# Patient Record
Sex: Male | Born: 1945
Health system: Southern US, Community
[De-identification: ages and names within clinical notes are randomized; demographics above are authoritative.]

## PROBLEM LIST (undated history)

## (undated) DIAGNOSIS — R600 Localized edema: Secondary | ICD-10-CM

## (undated) DIAGNOSIS — I071 Rheumatic tricuspid insufficiency: Secondary | ICD-10-CM

## (undated) DIAGNOSIS — I4891 Unspecified atrial fibrillation: Secondary | ICD-10-CM

## (undated) DIAGNOSIS — I509 Heart failure, unspecified: Secondary | ICD-10-CM

## (undated) DIAGNOSIS — N400 Enlarged prostate without lower urinary tract symptoms: Secondary | ICD-10-CM

## (undated) DIAGNOSIS — I10 Essential (primary) hypertension: Secondary | ICD-10-CM

## (undated) DIAGNOSIS — D649 Anemia, unspecified: Secondary | ICD-10-CM

## (undated) DIAGNOSIS — I71019 Dissection of thoracic aorta, unspecified: Secondary | ICD-10-CM

## (undated) DIAGNOSIS — Q2543 Congenital aneurysm of aorta: Secondary | ICD-10-CM

## (undated) DIAGNOSIS — D696 Thrombocytopenia, unspecified: Secondary | ICD-10-CM

## (undated) DIAGNOSIS — F329 Major depressive disorder, single episode, unspecified: Secondary | ICD-10-CM

## (undated) DIAGNOSIS — E785 Hyperlipidemia, unspecified: Secondary | ICD-10-CM

## (undated) DIAGNOSIS — I7100 Dissection of unspecified site of aorta: Principal | ICD-10-CM

## (undated) DIAGNOSIS — Z9289 Personal history of other medical treatment: Secondary | ICD-10-CM

## (undated) DIAGNOSIS — F32A Depression, unspecified: Secondary | ICD-10-CM

## (undated) DIAGNOSIS — I712 Thoracic aortic aneurysm, without rupture: Secondary | ICD-10-CM

## (undated) DIAGNOSIS — G709 Myoneural disorder, unspecified: Secondary | ICD-10-CM

## (undated) DIAGNOSIS — I340 Nonrheumatic mitral (valve) insufficiency: Secondary | ICD-10-CM

## (undated) DIAGNOSIS — I719 Aortic aneurysm of unspecified site, without rupture: Secondary | ICD-10-CM

## (undated) DIAGNOSIS — G35 Multiple sclerosis: Secondary | ICD-10-CM

## (undated) DIAGNOSIS — I7121 Aneurysm of the ascending aorta, without rupture: Secondary | ICD-10-CM

## (undated) DIAGNOSIS — I7101 Dissection of thoracic aorta: Secondary | ICD-10-CM

## (undated) DIAGNOSIS — J90 Pleural effusion, not elsewhere classified: Secondary | ICD-10-CM

## (undated) DIAGNOSIS — R0602 Shortness of breath: Secondary | ICD-10-CM

## (undated) HISTORY — DX: Thrombocytopenia, unspecified: D69.6

## (undated) HISTORY — DX: Anemia, unspecified: D64.9

## (undated) HISTORY — DX: Pleural effusion, not elsewhere classified: J90

## (undated) HISTORY — DX: Dissection of unspecified site of aorta: I71.00

## (undated) HISTORY — DX: Hyperlipidemia, unspecified: E78.5

## (undated) HISTORY — DX: Nonrheumatic mitral (valve) insufficiency: I34.0

## (undated) HISTORY — DX: Congenital aneurysm of aorta: Q25.43

## (undated) HISTORY — DX: Heart failure, unspecified: I50.9

## (undated) HISTORY — DX: Depression, unspecified: F32.A

## (undated) HISTORY — DX: Rheumatic tricuspid insufficiency: I07.1

## (undated) HISTORY — DX: Multiple sclerosis: G35

## (undated) HISTORY — DX: Dissection of thoracic aorta, unspecified: I71.019

## (undated) HISTORY — DX: Myoneural disorder, unspecified: G70.9

## (undated) HISTORY — DX: Thoracic aortic aneurysm, without rupture: I71.2

## (undated) HISTORY — DX: Aneurysm of the ascending aorta, without rupture: I71.21

## (undated) HISTORY — DX: Unspecified atrial fibrillation: I48.91

## (undated) HISTORY — DX: Benign prostatic hyperplasia without lower urinary tract symptoms: N40.0

## (undated) HISTORY — DX: Essential (primary) hypertension: I10

## (undated) HISTORY — DX: Aortic aneurysm of unspecified site, without rupture: I71.9

## (undated) HISTORY — DX: Major depressive disorder, single episode, unspecified: F32.9

## (undated) HISTORY — DX: Localized edema: R60.0

## (undated) HISTORY — DX: Dissection of thoracic aorta: I71.01

---
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
## 1998-10-15 ENCOUNTER — Encounter: Payer: Self-pay | Admitting: Internal Medicine

## 1998-10-15 ENCOUNTER — Inpatient Hospital Stay (HOSPITAL_COMMUNITY): Admission: EM | Admit: 1998-10-15 | Discharge: 1998-10-23 | Payer: Self-pay | Admitting: *Deleted

## 1998-10-15 HISTORY — PX: OTHER SURGICAL HISTORY: SHX169

## 1998-10-16 ENCOUNTER — Encounter: Payer: Self-pay | Admitting: Thoracic Surgery (Cardiothoracic Vascular Surgery)

## 1998-10-17 ENCOUNTER — Encounter: Payer: Self-pay | Admitting: Thoracic Surgery (Cardiothoracic Vascular Surgery)

## 1998-10-18 ENCOUNTER — Encounter: Payer: Self-pay | Admitting: Thoracic Surgery (Cardiothoracic Vascular Surgery)

## 1998-10-19 ENCOUNTER — Encounter: Payer: Self-pay | Admitting: Thoracic Surgery (Cardiothoracic Vascular Surgery)

## 1998-11-01 ENCOUNTER — Emergency Department (HOSPITAL_COMMUNITY): Admission: EM | Admit: 1998-11-01 | Discharge: 1998-11-01 | Payer: Self-pay | Admitting: Emergency Medicine

## 1998-11-01 ENCOUNTER — Encounter: Payer: Self-pay | Admitting: Internal Medicine

## 1998-11-05 DIAGNOSIS — I7100 Dissection of unspecified site of aorta: Secondary | ICD-10-CM

## 1998-11-05 HISTORY — DX: Dissection of unspecified site of aorta: I71.00

## 1998-11-26 ENCOUNTER — Encounter (HOSPITAL_COMMUNITY): Admission: RE | Admit: 1998-11-26 | Discharge: 1999-02-24 | Payer: Self-pay | Admitting: Cardiology

## 1999-02-25 ENCOUNTER — Encounter (HOSPITAL_COMMUNITY): Admission: RE | Admit: 1999-02-25 | Discharge: 1999-05-26 | Payer: Self-pay | Admitting: Cardiology

## 1999-07-26 ENCOUNTER — Inpatient Hospital Stay (HOSPITAL_COMMUNITY): Admission: EM | Admit: 1999-07-26 | Discharge: 1999-07-28 | Payer: Self-pay | Admitting: Emergency Medicine

## 1999-11-03 ENCOUNTER — Encounter
Admission: RE | Admit: 1999-11-03 | Discharge: 1999-11-03 | Payer: Self-pay | Admitting: Thoracic Surgery (Cardiothoracic Vascular Surgery)

## 1999-11-03 ENCOUNTER — Encounter: Payer: Self-pay | Admitting: Thoracic Surgery (Cardiothoracic Vascular Surgery)

## 2000-05-21 ENCOUNTER — Encounter
Admission: RE | Admit: 2000-05-21 | Discharge: 2000-05-21 | Payer: Self-pay | Admitting: Thoracic Surgery (Cardiothoracic Vascular Surgery)

## 2000-05-21 ENCOUNTER — Encounter: Payer: Self-pay | Admitting: Thoracic Surgery (Cardiothoracic Vascular Surgery)

## 2000-11-29 ENCOUNTER — Encounter: Payer: Self-pay | Admitting: Thoracic Surgery (Cardiothoracic Vascular Surgery)

## 2000-11-29 ENCOUNTER — Encounter
Admission: RE | Admit: 2000-11-29 | Discharge: 2000-11-29 | Payer: Self-pay | Admitting: Thoracic Surgery (Cardiothoracic Vascular Surgery)

## 2000-12-17 ENCOUNTER — Ambulatory Visit (HOSPITAL_COMMUNITY): Admission: RE | Admit: 2000-12-17 | Discharge: 2000-12-17 | Payer: Self-pay | Admitting: *Deleted

## 2000-12-17 ENCOUNTER — Encounter (INDEPENDENT_AMBULATORY_CARE_PROVIDER_SITE_OTHER): Payer: Self-pay | Admitting: *Deleted

## 2001-11-21 ENCOUNTER — Encounter
Admission: RE | Admit: 2001-11-21 | Discharge: 2001-11-21 | Payer: Self-pay | Admitting: Thoracic Surgery (Cardiothoracic Vascular Surgery)

## 2001-11-21 ENCOUNTER — Encounter: Payer: Self-pay | Admitting: Thoracic Surgery (Cardiothoracic Vascular Surgery)

## 2002-12-08 ENCOUNTER — Encounter
Admission: RE | Admit: 2002-12-08 | Discharge: 2002-12-08 | Payer: Self-pay | Admitting: Thoracic Surgery (Cardiothoracic Vascular Surgery)

## 2002-12-08 ENCOUNTER — Encounter: Payer: Self-pay | Admitting: Thoracic Surgery (Cardiothoracic Vascular Surgery)

## 2003-11-22 ENCOUNTER — Ambulatory Visit: Admission: RE | Admit: 2003-11-22 | Discharge: 2003-11-22 | Payer: Self-pay | Admitting: Internal Medicine

## 2003-11-22 ENCOUNTER — Encounter (INDEPENDENT_AMBULATORY_CARE_PROVIDER_SITE_OTHER): Payer: Self-pay | Admitting: Cardiology

## 2003-12-17 ENCOUNTER — Encounter
Admission: RE | Admit: 2003-12-17 | Discharge: 2003-12-17 | Payer: Self-pay | Admitting: Thoracic Surgery (Cardiothoracic Vascular Surgery)

## 2004-02-21 ENCOUNTER — Ambulatory Visit (HOSPITAL_COMMUNITY): Admission: RE | Admit: 2004-02-21 | Discharge: 2004-02-21 | Payer: Self-pay | Admitting: *Deleted

## 2004-02-22 ENCOUNTER — Encounter (INDEPENDENT_AMBULATORY_CARE_PROVIDER_SITE_OTHER): Payer: Self-pay | Admitting: Specialist

## 2004-06-03 ENCOUNTER — Encounter: Admission: RE | Admit: 2004-06-03 | Discharge: 2004-07-31 | Payer: Self-pay | Admitting: Neurology

## 2004-12-15 ENCOUNTER — Encounter
Admission: RE | Admit: 2004-12-15 | Discharge: 2004-12-15 | Payer: Self-pay | Admitting: Thoracic Surgery (Cardiothoracic Vascular Surgery)

## 2005-07-15 HISTORY — PX: OTHER SURGICAL HISTORY: SHX169

## 2005-07-19 ENCOUNTER — Emergency Department (HOSPITAL_COMMUNITY): Admission: EM | Admit: 2005-07-19 | Discharge: 2005-07-19 | Payer: Self-pay | Admitting: Emergency Medicine

## 2005-07-24 ENCOUNTER — Ambulatory Visit (HOSPITAL_COMMUNITY): Admission: RE | Admit: 2005-07-24 | Discharge: 2005-07-25 | Payer: Self-pay | Admitting: Orthopaedic Surgery

## 2006-01-04 ENCOUNTER — Encounter
Admission: RE | Admit: 2006-01-04 | Discharge: 2006-01-04 | Payer: Self-pay | Admitting: Thoracic Surgery (Cardiothoracic Vascular Surgery)

## 2006-05-07 ENCOUNTER — Encounter: Admission: RE | Admit: 2006-05-07 | Discharge: 2006-05-07 | Payer: Self-pay | Admitting: Internal Medicine

## 2007-02-28 ENCOUNTER — Encounter
Admission: RE | Admit: 2007-02-28 | Discharge: 2007-02-28 | Payer: Self-pay | Admitting: Thoracic Surgery (Cardiothoracic Vascular Surgery)

## 2007-02-28 ENCOUNTER — Ambulatory Visit: Payer: Self-pay | Admitting: Thoracic Surgery (Cardiothoracic Vascular Surgery)

## 2007-09-15 HISTORY — PX: HEMORRHOID SURGERY: SHX153

## 2007-09-27 ENCOUNTER — Emergency Department (HOSPITAL_COMMUNITY): Admission: EM | Admit: 2007-09-27 | Discharge: 2007-09-27 | Payer: Self-pay | Admitting: Emergency Medicine

## 2008-01-09 ENCOUNTER — Inpatient Hospital Stay (HOSPITAL_COMMUNITY): Admission: EM | Admit: 2008-01-09 | Discharge: 2008-01-14 | Payer: Self-pay | Admitting: Emergency Medicine

## 2008-01-09 ENCOUNTER — Ambulatory Visit: Payer: Self-pay | Admitting: Oncology

## 2008-01-15 ENCOUNTER — Inpatient Hospital Stay (HOSPITAL_COMMUNITY): Admission: EM | Admit: 2008-01-15 | Discharge: 2008-01-16 | Payer: Self-pay | Admitting: Emergency Medicine

## 2008-02-14 ENCOUNTER — Encounter (INDEPENDENT_AMBULATORY_CARE_PROVIDER_SITE_OTHER): Payer: Self-pay | Admitting: General Surgery

## 2008-02-16 ENCOUNTER — Inpatient Hospital Stay (HOSPITAL_COMMUNITY): Admission: AD | Admit: 2008-02-16 | Discharge: 2008-02-17 | Payer: Self-pay | Admitting: General Surgery

## 2008-03-12 ENCOUNTER — Encounter: Admission: RE | Admit: 2008-03-12 | Discharge: 2008-05-15 | Payer: Self-pay | Admitting: Neurology

## 2008-06-18 ENCOUNTER — Encounter
Admission: RE | Admit: 2008-06-18 | Discharge: 2008-06-18 | Payer: Self-pay | Admitting: Thoracic Surgery (Cardiothoracic Vascular Surgery)

## 2008-06-18 ENCOUNTER — Ambulatory Visit: Payer: Self-pay | Admitting: Thoracic Surgery (Cardiothoracic Vascular Surgery)

## 2009-07-01 ENCOUNTER — Ambulatory Visit: Payer: Self-pay | Admitting: Thoracic Surgery (Cardiothoracic Vascular Surgery)

## 2009-07-01 ENCOUNTER — Encounter
Admission: RE | Admit: 2009-07-01 | Discharge: 2009-07-01 | Payer: Self-pay | Admitting: Thoracic Surgery (Cardiothoracic Vascular Surgery)

## 2009-11-15 ENCOUNTER — Encounter: Admission: RE | Admit: 2009-11-15 | Discharge: 2009-11-15 | Payer: Self-pay | Admitting: Cardiology

## 2010-03-07 ENCOUNTER — Ambulatory Visit: Payer: Self-pay | Admitting: Internal Medicine

## 2010-03-12 LAB — CBC WITH DIFFERENTIAL/PLATELET
BASO%: 1.2 % (ref 0.0–2.0)
Basophils Absolute: 0.1 10*3/uL (ref 0.0–0.1)
EOS%: 0.8 % (ref 0.0–7.0)
Eosinophils Absolute: 0 10*3/uL (ref 0.0–0.5)
HCT: 39.2 % (ref 38.4–49.9)
HGB: 13.1 g/dL (ref 13.0–17.1)
LYMPH%: 18.1 % (ref 14.0–49.0)
MCH: 28.6 pg (ref 27.2–33.4)
MCHC: 33.3 g/dL (ref 32.0–36.0)
MCV: 85.9 fL (ref 79.3–98.0)
MONO#: 0.5 10*3/uL (ref 0.1–0.9)
MONO%: 9.8 % (ref 0.0–14.0)
NEUT#: 3.3 10*3/uL (ref 1.5–6.5)
NEUT%: 70.1 % (ref 39.0–75.0)
Platelets: 80 10*3/uL — ABNORMAL LOW (ref 140–400)
RBC: 4.57 10*6/uL (ref 4.20–5.82)
RDW: 14.7 % — ABNORMAL HIGH (ref 11.0–14.6)
WBC: 4.7 10*3/uL (ref 4.0–10.3)
lymph#: 0.8 10*3/uL — ABNORMAL LOW (ref 0.9–3.3)

## 2010-03-12 LAB — COMPREHENSIVE METABOLIC PANEL
ALT: 19 U/L (ref 0–53)
AST: 23 U/L (ref 0–37)
Albumin: 4.5 g/dL (ref 3.5–5.2)
Alkaline Phosphatase: 162 U/L — ABNORMAL HIGH (ref 39–117)
BUN: 25 mg/dL — ABNORMAL HIGH (ref 6–23)
CO2: 26 mEq/L (ref 19–32)
Calcium: 10.1 mg/dL (ref 8.4–10.5)
Chloride: 104 mEq/L (ref 96–112)
Creatinine, Ser: 1.24 mg/dL (ref 0.40–1.50)
Glucose, Bld: 89 mg/dL (ref 70–99)
Potassium: 4.3 mEq/L (ref 3.5–5.3)
Sodium: 143 mEq/L (ref 135–145)
Total Bilirubin: 1 mg/dL (ref 0.3–1.2)
Total Protein: 7.4 g/dL (ref 6.0–8.3)

## 2010-03-12 LAB — LACTATE DEHYDROGENASE: LDH: 128 U/L (ref 94–250)

## 2010-10-05 ENCOUNTER — Encounter: Payer: Self-pay | Admitting: Thoracic Surgery (Cardiothoracic Vascular Surgery)

## 2011-01-27 NOTE — H&P (Signed)
NAMECATRELL, THORNBERRY             ACCOUNT NO.:  [id number]   MEDICAL RECORD NO.:  [id number]          PATIENT TYPE:  INP   LOCATION:  57                         FACILITY:  [facility]   PHYSICIAN:  Orson Ape. Weatherly, M.D.DATE OF BIRTH:  August 14, 1946   DATE OF ADMISSION:  01/09/2008  DATE OF DISCHARGE:                              HISTORY & PHYSICAL   CHIEF COMPLAINT:  Bleeding hemorrhoids.   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 The patient states that with  prolonged sitting this has happened on two occasions. While flying, he  gets back problems with hemorrhoids, and then approximately three years  ago, he did see Dr. Donnie Mesa in our office for problems with  hemorrhoids. Dr. Georgette Dover noted that he had external and internal  hemorrhoids and kind of recommended at that time a [place], but the patient  had an injury to his wrist, I think, and elected not to proceed with any  type of hemorrhoid surgery. He followed with [name], and he was  diagnosed as having multiple sclerosis, and Dr. Katherina Right is his  neurologist, and he was admitted to the hospital January 09, 2008 with  rectal bleeding that occurred kind of spontaneously. The patient states  this was kind of painless bleeding but is thought to have come from his  hemorrhoids, and on physical examination, he does have severe prolapsed  internal and external hemorrhoids. I was impressed on doing a rectal  exam that he really does not have a lot of sphincter tone, and I think  that he is having a weakness of his perianal sphincters that is actually  contributing to this prolapse. I called and talked to Dr. Katherina Right who  said that he had not done a rectal examination recently, that Mr.  Fairhurst's multiple sclerosis is predominantly lower extremity weakness.  He does still ambulate with a  cane. He is still active teaching at [employer],  and he is on his feet for about an hour during his lecturing.   The patient's past history is significant in that he has had a  colonoscopy. Dr. Vladimir Faster actually did his colonoscopy and referred him  to Dr. Georgette Dover in 2006, and at that time, there was no evidence of any  diverticulosis. I have reviewed some old CTs that have been performed,  and he always has a large amount of stool within his colon on the old  CTs that were done. I think at the time of his CTs, he has having  problems with dissecting aortic aneurysm. He also has always had kind of  a low platelet count. He is not thought to be a sclerotic  and  platelet  counts have been in about the 110 range. His liver tests, I think, have  always been normal. He has got a history of hypertension for which he is  on medication for control, Cardura, Lopressor, Norvasc, and also  diuretics.   On examination, at this time, he has  kind of a very poor muscle tone of  his abdomen. Not really any obvious ascites but kind of abdomen blocks  easily. Then, on rectal exam, he has got extensive, thrombosed, and  prolapsed external and internal hemorrhoids, big large swelling that  looked painful even though he is not having a lot of sphincter spasm and  not complaining of as much pain as you would expect normally for this  degree of thrombosed hemorrhoids. I cannot find any evidence of any  obvious infection other than the hemorrhoid area on kind of a rectal  examination, and the patient is febrile when he was admitted up to about  103. I am not sure what the etiology of his fever is. His white count  when he was admitted to the ER yesterday was 14,300 with a left shift on  the differential. He does have known gallstones, but he is not tender in  the abdomen. His PT and PTT were unremarkable. Liver tests are good, and  his BUN is 21 with glucose of 117.   I think that really with his degree of  hemorrhoids ultimately he is  going to come to a formal internal and external hemorrhoidectomy. The  patient is very reluctant to want to do anything from a surgery  standpoint. I think he has a significant fear that his multiple  sclerosis may progress with any type of general anesthesia and is asking  if I can do a formal hemorrhoidectomy with a local anesthesia, and with  the degree of hemorrhoidal problems that he has, that would not be  technically possibly in my opinion. The patient is thinking about what  to do. I asked [name] if he could possibly stop by and see the  patient. He said he was not going to the hospital, [name], today, and  that if we needed a neurologist to see him, one of his partners could  see him. I think [name] is the only neurologist that the patient has  confidence in, and if surgery would become needed, I think that brief  discussion with [name] would sooth a lot of anxiety. The other thing  that if we were actually planning on doing surgery, I would like to do  him prone so that we will have better exposure than trying to put his  feet up in a lithotomy position as I normally do my hemorrhoidectomies.  The neurologist have seen him and feel that he does not need another  colonoscopy until the year 2010, and I think that the bleeding that he  has had their concern that this would definitely hemorrhoidal bleeding,  even though I do not see an actual area that is actually bleeding right  at this time when I examined the patient. His hematocrit originally was  36; it is now 30. So he probably did have a significant amount of  bleeding, which was painless and possibly could be diverticulosis, but I  think that for now the hemorrhoids are the most pressing problem. I will  follow and let the patient decide whether the conservative measures that  they presently doing with the ointments and bedrest and etc., are not  relieving him. 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   IMPRESSION:  Severe internal and external thrombosed bleeding  hemorrhoids in a patient with known multiple sclerosis that his  progressing.           ______________________________  Orson Ape. Rise Patience, M.D.     WJW/MEDQ  D:  01/10/2008  T:  01/10/2008  Job:  [id number]

## 2011-01-27 NOTE — Discharge Summary (Signed)
[name]             ACCOUNT NO.:  [id number]   MEDICAL RECORD NO.:  [id number]          PATIENT TYPE:  INP   LOCATION:  1222                         FACILITY:  [facility]   PHYSICIAN:  Leana Gamer, MDDATE OF BIRTH:  04/24/1946   DATE OF ADMISSION:  01/09/2008  DATE OF DISCHARGE:  01/13/2008                               DISCHARGE SUMMARY   DISCHARGE DISPOSITION:  Home.   FINAL DISCHARGE DIAGNOSES:  1. Acute febrile illness, likely viral.  2. Diarrhea.  3. Thrombocytopenia, resolving.  4. Rectal bleeding.  5. Internal hemorrhoids.  6. History of multiple sclerosis for approximately 12 years.  The      patient is seen by [name].  7. History of aortic dissection in 2001 surgically repaired.  8. History of hypertension.  9. History of hyperlipidemia.  10.History of benign prostatic hypertrophy.   DISCHARGE MEDICATIONS:  1. Lasix 40 mg p.o. daily.  2. Crestor 5 mg p.o. daily.  3. Cardura 4 mg p.o. daily  4. Atacand/hydrochlorothiazide 32/12.5 mg p.o. daily.  5. Folic acid 1 mg p.o. daily.  6. Lopressor 100 mg p.o. daily.  7. Norvasc 5 mg p.o. daily.  8. Ciprofloxacin 500 mg p.o. b.i.d. x 5 days.   CONSULTANTS:  1. Dr. Watt Climes and Dr. Penelope Coop, gastroenterology.  2. Dr. Rise Patience, general surgery.  3. Dr. Benay Spice, hematology.   PROCEDURE:  None.   DIAGNOSTIC STUDIES:  1. Chest x-ray which shows no acute findings.  2. Renal ultrasound which shows no hydronephrosis or acute      abnormalities of the kidneys.  It shows bilateral renal cysts.      Impression:  A few gallstones within the gallbladder and with wall      thickening.  The wall thickening may be due to volume overload with      small volume ascites seen in the study.  Left pleural effusion.   CHIEF COMPLAINT:  Bright red blood per rectum.   HISTORY OF PRESENT ILLNESS:  Please see the H&P dictated by Dr. Carlton Adam on  January 09, 2008 for details of the HPI.   HOSPITAL COURSE:  1. Febrile illness.   The patient came with a febrile illness of      unknown etiology.  All cultures were negative including stool      cultures.  The patient was having some diarrhea at the time and was      started empirically on Flagyl and ciprofloxacin proceeded.      However, the patient never had any major escalation of the white      blood cell count and the fever abated.  It was felt by all involved      that this was not C. diff and the patient was discontinued from the      Flagyl.  2. Urinary tract infection versus colonization.  The patient was found      to have 37,000 colonies of E. coli in the urine.  Typically this      would not represent an infection.  However given the patient's      febrile state and his falling  platelet count, the patient was      continued on Cipro for treatment and he will be continued on Cipro      to complete a total of 10 days of treatment.  3. Thrombocytopenia.  The patient on admission had a platelet count of      120.  During his course of hospitalization, the platelet counts      dropped to a nadir of 48.  Hematology was consulted, particularly      in light of the fact that the patient was continuing to have some      bleeding from the rectum.  Their opinion was that it was a      combination of the febrile illness and possibly the Betaseron that      the patient takes for his emesis.  However as the febrile illness      resolved, the platelets started to come up.  On the day of      discharge, platelet count is 68,000.  Dr. Benay Spice has had a      discussion with the patient and has recommended the patient should      have his platelet counts checked on Monday and then his primary      physician, [name] make a decision about whether or not he needs      to follow up with hematology.  4. Rectal bleeding.  The patient was seen both by Dr. Watt Climes from      gastroenterology and by Dr. Rise Patience from general surgery.  Dr.      Rise Patience felt that the patient  would require hemorrhoidectomy to      definitively treat the bleeding in the hemorrhoids.  However, the      patient has declined at this time and would like to wait as an      outpatient and giving it further consideration.  The patient had      several episodes of intermittent bleeding.  However, his hemoglobin      remained stable at 10.  On the day of discharge the patient has a      hemoglobin of 10.0.  I understand that this is a decline in his      normal hemoglobin of approximately 13.  However, he had no big      decreases in his hemoglobin while hospitalized.  The patient did      have a discussion with Dr. Watt Climes about having a colonoscopy to      further investigate the cause of the bleeding.  However, the      patient again declined and states he would like to do this is an      outpatient.  Otherwise, the patient remained stable in the      hospital.  I have palced a call to Dr. Levin Erp to ask him to confer with the  patient by phone regarding the wisdom of obtaining the colonoscopy prior  to discharge from the hospital.   DISCHARGE MEDICATIONS:  He is continued on his usual medications and he  is being discharged on the usual medications in addition to which he is  continued on ciprofloxacin.   DIET:  He has tolerated a diet without any difficulty and his diet  should continue to be a heart-healthy low-cholesterol diet.   FOLLOW UP:  1. He should follow up with Dr. Levin Erp, his primary care      physician on Monday for a check  of his CBC to evaluate his      platelets and his hemoglobin count.  2. He should follow up with Dr. Watt Climes in the outpatient setting for      arrangements for the colonoscopy.  Follow up with surgery will be      dependent upon the patient's decision about having a      hemorrhoidectomy.   CONDITION ON DISCHARGE:  Stable.   PHYSICAL EXAMINATION:  VITAL SIGNS:  Temperature is 99, heart rate 63,  blood pressure 143/63, respiratory rate  17,, O2 sat is 98% on room air.  GENERAL:  He is awake, alert, nontoxic appearing.  LUNGS:  Clear to auscultation.  HEART:  He has a normal S1-S2, no murmurs, rubs or gallops are noted.  ABDOMEN:  Soft, nontender, nondistended.  No masses, no  hepatosplenomegaly.  He has normal bowel sounds.  EXTREMITIES:  There is no clubbing or cyanosis in the lower extremities.   LABORATORY DATA:  At the day of discharge:  Hemoglobin is 10, white  blood cell count 7.2, hematocrit 30.2 and platelet count 68.      Leana Gamer, MD  Electronically Signed     MAM/MEDQ  D:  01/13/2008  T:  01/13/2008  Job:  [id number]   cc:   Lance Muss, M.D.  Fax: [phone]   Jeryl Columbia, M.D.  Fax: [phone]   Orson Ape. Rise Patience, M.D.  [address], [place] [address]   Gary B. Benay Spice, M.D.  Fax: [phone]

## 2011-01-27 NOTE — H&P (Signed)
[name], [name]             ACCOUNT NO.:  [id number]   MEDICAL RECORD NO.:  [id number]          PATIENT TYPE:  INP   LOCATION:  0110                         FACILITY:  [facility]   PHYSICIAN:  Vladimir Faster, MD        DATE OF BIRTH:  12-16-1945   DATE OF ADMISSION:  01/09/2008  DATE OF DISCHARGE:                              HISTORY & PHYSICAL   PRIMARY CARE PHYSICIAN:  Dr. Levin Erp.   PRIMARY NEUROLOGIST:  [name].   CHIEF COMPLAINTS:  Rectal bleeding.   HISTORY OF PRESENT ILLNESS:  Mr. Romm is a 65 year old gentleman who  has a history of multiple sclerosis for the last 12 years who comes in  with bright red blood per rectum.  He states he had a similar episode  approximately seven to eight, and at that time, he had seen a doctor in  the [place] [facility], and at that time, he was told that  this was secondary to hemorrhoids.  Today, he woke up at 2 the morning,  and there was bright red blood in his stools.  He describes it as  liquidish, large amount of blood.  He denied any pain with defecation at  that time.  He also denies any nausea, vomiting, diarrhea, or  constipation.  He denies any fever or chills.  He denies shortness of  breath or dysuria.   PAST MEDICAL HISTORY:  1. Multiple sclerosis for approximately 12 years.  He sees [name]      for it.  2. History of aortic dissection in 2001 for which he had surgery.  3. Hypertension.  4. Hyperlipidemia.   PAST SURGICAL HISTORY:  Surgery for aortic dissection 2001.   ALLERGIES:  No known drug allergies.   CURRENT MEDICATIONS:  1. He is on Lasix 40 mg daily.  2. Atacand/hydrochlorothiazide 32/12.5 daily.  3. Crestor 5 mg daily.  4. Cardura 4 mg daily.  5. Folic acid 1 mg daily.  6. Norvasc 5 mg daily.  7. Lopressor 100 g twice daily.   SOCIAL HISTORY:  Lives at home with his wife.  He denies any history of  smoking, alcohol, or drug abuse.   FAMILY HISTORY:  Is noncontributory.   REVIEW OF  SYSTEMS:  GENERAL:  He denies any recent weight loss, weight  gain.  No fever, chills.  HEENT:  No headaches, no blurred vision, no  sore throat.  CARDIOVASCULAR SYSTEM:  Negative for chest pain,  palpitations. RESPIRATORY SYSTEMS:  No shortness of breath, cough.  GASTROINTESTINAL:  No abdominal pain, nausea, vomiting, diarrhea or  constipation.   PHYSICAL EXAMINATION:  He is alert oriented x3.  VITAL SIGNS:  Temperature is 102, pulse rate 77, blood pressure 104/43,  respiratory rate 18 per minute, oxygen saturation 97% on room air.  HEENT:  Head atraumatic, normocephalic.  Pupils bilaterally equal and  reactive to light.  Mucous minutes appear moist.  NECK:  Supple.  No JVD, no carotid bruits.  CARDIOVASCULAR SYSTEM:  S1 and S2 heard. Regular rhythm.  CHEST:  Clear to auscultation.  ABDOMEN:  Soft, nontender.  EXTREMITIES:  No edema, cyanosis, or clubbing.   Labs show white count of 14.3, hemoglobin 11, and platelets 85.  Sodium  143, potassium 4.3, chloride 110, bicarb 28, BUN of 21, creatinine 1.4,  glucose 117.  His INR is 1.18, PTT is 33, total bilirubin is 1.6, AST of  29, ALT of 27, alkaline phosphatase 66.  His platelets in November 2006  was 133.   IMPRESSION:  1. Rectal bleeding.  2. Leukocytosis.  3. Low grade fever.  4. Thrombocytopenia.  5. Mild renal insufficiency.  6. History of multiple sclerosis.  7. Hypertension.  8. Hyperlipidemia.   PLAN:  1. Rectal bleeding.  This is 65 year old gentleman who comes in with      bright red blood in his stools.  He has a history of hemorrhoids.      I suspect this to be likely hemorrhoids versus diverticular bleed      versus AV formation as he did not have any abdominal pain.  He has      seen [facility] in the past, and he requests [facility] at this time,      so we will consult them.  We will monitor his H&H, and he will      likely need a colonoscopy/sigmoidoscopy when he is stable.  At this      time, his hemoglobin  appears stable.  If he has active bleeding, we      can consider platelet transfusion as his platelets are on the low      side at this time.  2. Mild leukocytosis with a low grade fever.  He has a white count of      43 and had a fever of 102.  He does not have an obvious source of      infection at this time.  I will get blood cultures on him and a UA,      and if he starts spiking a temperature, I would empirically start      him on Cipro and Flagyl to cover any intra-abdominal pathology.  I      will also consider imaging his abdomen if he starts spiking a high-      grade fever.  3. Thrombocytopenia.  His platelets were 85 at this time.  His last      platelets in 2006 was 133.  I suspect this to be chronic, but he      does not have any liver disorder.  His liver function tests are      within normal limits.  He may need an oncology consult as an      outpatient.  4. Mild renal insufficiency.  His creatinine is 1.41.  I do not know      his baseline creatinine.  I will hold his ARB and his Lasix at this      time and hydrate him gently.  5. History of multiple sclerosis.  As per the family, his multiple      sclerosis does get worse when he is admitted to the hospital.  At      this time, it appears stable.  6. Hypertension.  I am holding his Lasix and Atacand but I will      continue his other medications.  If he blood pressure starts to      drop, we will hold all his blood pressure medications.  7. I will put him on SCDs for DVT prophylaxis.      Prem  Gertha Calkin, MD  Electronically Signed     PKN/MEDQ  D:  01/09/2008  T:  01/09/2008  Job:  [id number]

## 2011-01-27 NOTE — Op Note (Signed)
[name], [name]             ACCOUNT NO.:  [id number]   MEDICAL RECORD NO.:  [id number]          PATIENT TYPE:  INP   LOCATION:  1222                         FACILITY:  [facility]   PHYSICIAN:  John C. Amedeo Plenty, M.D.    DATE OF BIRTH:  1946-05-13   DATE OF PROCEDURE:  01/14/2008  DATE OF DISCHARGE:                               OPERATIVE REPORT   PROCEDURE:  Colonoscopy.   INDICATIONS FOR PROCEDURE:  Hematochezia, last colonoscopy 2004.   PROCEDURE IN DETAIL:  The patient was placed in the left lateral  decubitus position and placed on the pulse monitor with continuous low  flow oxygen delivered by nasal cannula.  He was sedated with 75 mcg IV  fentanyl and 6 mg IV Versed.  The Olympus video colonoscope was inserted  into the rectum and advanced to the cecum, confirmed by  transillumination of McBurney's point and visualization of the ileocecal  valve and appendiceal orifice.  The prep was excellent.  The cecum,  ascending, transverse, descending and sigmoid colon all appeared normal  with no masses, polyps, diverticula or other mucosal abnormalities.  The  rectum likewise appeared normal.  Retroflexed view of the anus revealed  no obvious internal hemorrhoids.  However, there were very enlarged,  inflamed, friable appearing external hemorrhoids although they did not  bleed during the procedure.  These were photographed.   IMPRESSION:  Large external hemorrhoids probably responsible for his  bleeding with no other source identified.   PLAN:  If bleeding continues to the point of anemia, especially with his  thrombocytopenia, surgery should be considered.           ______________________________  Elyse Jarvis Amedeo Plenty, M.D.     JCH/MEDQ  D:  01/14/2008  T:  01/14/2008  Job:  [id number]

## 2011-01-27 NOTE — Assessment & Plan Note (Signed)
OFFICE VISIT   ARTICE, MINSKY  DOB:  Dec 24, 1945                                        July 01, 2009  CHART #:  [id number]   HISTORY OF PRESENT ILLNESS:  The patient returns to the office today for  routine followup and annual surveillance, status post hemiarch repair of  acute type A aortic dissection with resuspension of the native aortic  valve in February 2000.  He was last seen here in the office on June 18, 2008.  Since then, he has continued to do well.  He reports that his  blood pressure has remained under stable control.  He has not had any  episodes of significant chest pain or chest tightness.  He denies any  problems with significant pain in the chest radiating to the back.  He  does note that he will occasionally feel his heart beating irregularly  after exercise.  This typically occurs after he has been swimming.  This  is not associated with any chest pain or chest discomfort.  He does have  some exertional shortness of breath, but he states that this is  essentially stable and he does not think it has really been changing  significantly.  The remainder of his review of systems is entirely  unremarkable.  The remainder of his past medical history is unchanged.   CURRENT MEDICATIONS:  1. Norvasc 5 mg daily.  2. Crestor 5 mg daily.  3. Metoprolol 100 mg daily.  4. Folic acid 1 mg daily.  5. Lasix 40 mg daily.  6. Seroquel 1 tablet daily.  7. Cardura 1 tablet daily.  8. Betaseron.  9. He is no longer taking Atacand as his insurance company did not      want to pay for him.   PHYSICAL EXAMINATION:  Notable for well-appearing male with blood  pressure 145/77, pulse 52, oxygen saturation 98% on room air.  HEENT  exam is unrevealing.  The neck is supple.  There is no cervical or  supraclavicular adenopathy.  No jugular venous distention is noted.  Auscultation of the chest reveals clear breath sounds which are  symmetrical  bilaterally.  No wheezes or rhonchi are noted.  Cardiovascular exam is notable for regular rate and rhythm.  No murmurs,  rubs, or gallops are noted.  The abdomen is soft, nontender.  The  extremities are warm and well perfused.  There is moderate bilateral  lower extremity edema.  Remainder of his physical exam is unremarkable.   DIAGNOSTIC TESTS:  CT angiogram of the chest performed today at  [facility] is reviewed and compared with the last  previous exam from October 2009.  This demonstrates stable radiographic  appearance of the thoracic aorta.  There is a chronic dissection  involving the transverse aortic arch and descending thoracic aorta which  is unchanged.  The maximum diameter of the transverse aortic arch just  beyond the distal anastomosis from surgical repair is approximately 5  cm, again unchanged.  The aortic root itself appears unchanged, and  there is no sign of any false aneurysm proximally or distally.  The  descending thoracic aorta is normal in caliber with chronic dissection  flap remaining stable.  No other abnormalities are noted.   IMPRESSION:  Stable radiographic appearance of the thoracic aorta,  status post hemiarch  repair of acute type A aortic dissection more than  10 years ago.   PLAN:  We will have the patient return in 1 year's time for a followup  CT angiogram.  I have suggested that he might wish to consider talking  to [name] about the possibility of a stress test if he develops any  significant increasing symptoms of exertional shortness of breath and/or  tachy palpitations.   Valentina Gu. Roxy Manns, M.D.  Electronically Signed   CHO/MEDQ  D:  07/01/2009  T:  07/01/2009  Job:  [id number]   cc:   Alyson Locket. Love, M.D.  Lance Muss, M.D.

## 2011-01-27 NOTE — Discharge Summary (Signed)
NAMEANKER, KRAVEC             ACCOUNT NO.:  [id number]   MEDICAL RECORD NO.:  [id number]          PATIENT TYPE:  INP   LOCATION:  1222                         FACILITY:  [facility]   PHYSICIAN:  Leana Gamer, MDDATE OF BIRTH:  [birth year]   DATE OF ADMISSION:  01/09/2008  DATE OF DISCHARGE:                               DISCHARGE SUMMARY   ADDENDUM   This is an addendum to his discharge summary from Jan 13, 2008, to Jan 14, 2008.  The patient was dictated and ready to be discharged home when he  went to the toilet and had bright a large amountred blood per rectum, .  [name] (patient's PMD) had a discussion with the patient who then  agreed to proceed with colonoscopy as had been offered to him by Dr.  Watt Climes earlier that morning.  The patient was given his bowel prep and  underwent his colonoscopy this morning which revealed no other lesions  except for the external hemorrhoids which continued to have intermittent  bleeding.  The patient was also noted to have a drop in his hemoglobin  from admission value of 11.9 down to 8.9.  Given the fact that the  patient has been having intermittent bleeding which is surely the cause  for his drop in hemoglobin, I had further discussion with the patient  and we have agreed to a transfusion of 2 units of packed red blood  cells. The patient again has not decided upon whether he will proceed  with the surgery for hemorrhoids and that will be discussed with primary  care physician at a later date.  The patient has received his 2 units of  packed red blood cells without incident and hemoglobin and hematocrit is  being drawn for baseline post transfusion.      Leana Gamer, MD  Electronically Signed     MAM/MEDQ  D:  01/14/2008  T:  01/14/2008  Job:  [id number]   cc:   Lance Muss, M.D.  Fax: [phone]   Jeryl Columbia, M.D.  Fax: [phone]   Orson Ape. Rise Patience, M.D.  [address], [address]  [place]  [place]  [address]   Lennon Alstrom, MD  Fax: [phone]

## 2011-01-27 NOTE — Consult Note (Signed)
[name], [name]             ACCOUNT NO.:  [id number]   MEDICAL RECORD NO.:  [id number]          PATIENT TYPE:  INP   LOCATION:  1222                         FACILITY:  [facility]   PHYSICIAN:  Izola Price. Benay Spice, M.D. DATE OF BIRTH:  1945-12-22   DATE OF CONSULTATION:  01/12/2008  DATE OF DISCHARGE:                                 CONSULTATION   REASON FOR CONSULTATION:  Thrombocytopenia.   REFERRING PHYSICIAN:  Dr. Rodena Piety.   HISTORY OF PRESENT ILLNESS:  [name] is a 65 year old man asked to  see for evaluation of thrombocytopenia.  He has history of multiple  sclerosis dating back to around 1997, on Betaseron every other day.  According to the patient, he has known low platelet count, at least  dating back to 2006, in the range of 110-130 K.  On admission for rectal  bleed, secondary to severe hemorrhoids, his platelet count was 133 K.  This began to drop to 85, 69, 86, and today 48 K.  No heparin was  received.  Peripheral smear was requested for review.  This showed  decreased platelets, some are large, with no clumps.  His white count  shows increased bands, and his red blood cells showed numerous  ovalocytes, few teardrops, burr cells, no increased polychromasia.  No  schistocytes.  The patient was experiencing rectal bleeding secondary to  his hemorrhoids.  His hemoglobin and hematocrit were decreased secondary  to this acute blood loss.  His white count was initially elevated,  although today are normal at 6.2.  He also had a febrile illness.  The  fever may be related to the E. coli UTI or enteritis that he may be  experiencing.  He has been placed on IV antibiotics.  We were asked to  see him regarding his thrombocytopenia.   PAST MEDICAL HISTORY:  1. Multiple sclerosis since 1997, on Betaseron, followed by [name].  2. Hemorrhoids, severe, requiring surgery in the future.  3. Prior history of thoracic aneurysm dissection in the year 2000.  4. History of multiple  hyperplastic polyps in colon in the year 2005      but no diverticula.  5. Hypertension.  6. Depression.  7. Status post wrist fracture.  8. Questionable renal insufficiency versus renal insufficiency      secondary to dehydration.  9. Hyperlipidemia.  10.Thrombocytopenia, as above.  11.Iron deficient anemia.   SURGERY:  1. Status post ascending aortic aneurysm repair and graft secondary to      dissection year 2000, Dr. Talbert Nan.  2. Status post ORIF with locking plate November 10, 579FGE, Dr.      Ninfa Linden.   ALLERGIES:  NKDA.   MEDICATIONS:  Norvasc, Cipro IV, Cardura, folic acid, Lopressor, Flagyl,  Protonix, Seroquel, Crestor, Phenergan, Ambien, and Tylenol.   REVIEW OF SYSTEMS:  Remarkable for fever up to 103 during admission,  chills at home.  No night sweats, headaches, confusion, or double  vision.  No dysphagia.  No dyspnea on exertion or shortness of breath,  productive cough, or chest pain.  No abdominal pain or GERD symptoms at  this time.  He had mild nausea and vomiting x1 before coming to the  hospital.  He also had diarrhea beginning 1 week prior to admission.  He  also had blood in the stools as mentioned in HPI.  No hematuria,  dysuria, or back pain.  No numbness or edema.  No weight loss or failure  to thrive.   FAMILY HISTORY:  Mother died of unknown causes.  His father died of  thyroid cancer.  Brother died of thyroid cancer.   SOCIAL HISTORY:  The patient is married.  He is Jewish.  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  No alcohol or tobacco history.   HEALTH MAINTENANCE:  His colonoscopy was performed in 2005 by Dr. Knox Saliva.  He is up-to-date with his medications and his physicals and  vaccinations.   PHYSICAL EXAMINATION:  GENERAL:  This is a well-developed, 65 year old,  white male in no acute distress, although anxious.  Flat affect.  VITAL SIGNS:  Blood pressure 151/76, pulse 62, respirations 18,  temperature 98.4, pulse  oximetry 100% on room air.  Weight 84.2 kg.  HEENT:  Normocephalic, atraumatic.  Oral cavity without lesions.  He has  thrush.  He external ocular exam very slight deviation toward the nasal  area of his eye muscles.  LUNGS:  Clear with slightly decreased breath sounds on the left.  No  axillary masses.  CARDIOVASCULAR:  Regular rate and rhythm without  murmurs, rubs, or gallops.  ABDOMEN:  Soft, slightly tender in the left upper quadrant.  Bowel  sounds x4.  No hepatosplenomegaly.  His abdomen is somewhat distended.  EXTREMITIES:  Without clubbing or cyanosis.  No edema.  No inguinal  masses.  SKIN:  Without bruising or petechial rash.  GU/RECTAL:  Per chart.  Please refer to the surgical note by Dr.  Rise Patience.  The patient has significant amount of hemorrhoids,  extensive, thrombosed, prolapsed, external and internal, and according  to CCS, no sphincter spasm.  He is also wearing a condom catheter.  MUSCULOSKELETAL:  No spinal tenderness.  NEURO:  Please refer to the neuro notes as an outpatient.  The patient  has multiple sclerosis which causes him to have slight decrease in  strength in the extremities, and the patient needs to use a cane during  ambulation.   LABS:  Hemoglobin 10.8, hematocrit 32, white count 6.2, platelets  48,000, MCV 83.9.  Of note, his platelets were 107 on April 27, and as  of November 2006, he had a platelet count of 133,000.  His platelets  range between 110 and 130 chronically.  Sodium 141, potassium 4, BUN 20,  creatinine 1.47, glucose 120, total bilirubin 1.6, alkaline phosphatase  66, AST 29, ALT 27, total protein 6.3, albumin 3.5, calcium 8.8, TSH  0.849.  C. difficile negative.  Ova and parasites negative.  His urine  culture shows preliminary results consistent with E. coli UTI.   ASSESSMENT:  1. Multiple sclerosis.  2. Rectal bleed secondary to hemorrhoids.  3. Fever.  4. Urinary tract infection.  5. Diarrhea with Clostridium difficile  negative.  6. History of aortic aneurysm.  7. Hypertension.  8. Thrombocytopenia.   This is a pleasant, 65 year old, white man asked to see for evaluation  of thrombocytopenia.  The patient was admitted with rectal bleeding  secondary to hemorrhoids.  He has a febrile illness.  The fever may be  related to the Escherichia coli urinary tract infection or enteritis.  There is a low clinical suspicion  for Escherichia coli associated  hemolytic uremic syndrome or thrombotic thrombocytopenic purpura.  The  thrombocytopenia is chronic, likely secondary to interferon therapy.  The low platelet count during his admission may be related to acute  infection.  The anemia is acute, likely secondary to rectal bleeding.  The blood smear suggests iron deficiency.   RECOMMENDATIONS:  1. Continue antibiotics for the UTI.  2. History of rectal bleeding, per Surgery - GI.  3. Consider holding the interferon until the platelets return to      baseline.  4. Check CBC on Jan 13, 2008.  5. Check the retic count and iron studies.  6. Add liver functions to the B-mets in the morning.   Thank you very much for allowing [place] the opportunity to participate in  the care of Mr. Marland Kitchen.  We will follow the results, and further  recommendations are to proceed.      Sharene Butters, P.A.      Izola Price. Benay Spice, M.D.  Electronically Signed    SW/MEDQ  D:  01/12/2008  T:  01/12/2008  Job:  [id number]   cc:   Lance Muss, M.D.  Fax: [phone]   Dr. Vanessa Kick. Ninfa Linden, M.D.  [phone]   Wonda Horner, M.D.  Fax: [phone]

## 2011-01-27 NOTE — Assessment & Plan Note (Signed)
OFFICE VISIT   [name], [name]  DOB:  06-13-1946                                        June 18, 2008  CHART #:  [id number]   HISTORY OF PRESENT ILLNESS:  The patient returns for routine followup  and surveillance status post hemiarch repair of acute type A aortic  dissection with resuspension of the native aortic valve in February  2000.  The patient returns for annual surveillance and followup of his  remaining thoracic aorta.  He was last seen here in the office on February 28, 2007.  This past spring, he developed a lower GI bleed related to  internal hemorrhoids, which ultimately required surgical intervention.  Apparently, this caused some flare-up of symptomatology related to  multiple sclerosis, and it took him some time to recover.  However,  recently, he has been doing well.  He denies any pain in the chest or  back, which could be potentially related to his chronically dissected  thoracic aorta.  He has no symptoms of exertional shortness of breath.  The remainder of his review of systems is noncontributory.  The  remainder of his past medical history is unchanged.   CURRENT MEDICATIONS:  Norvasc 5 mg daily, Crestor 5 mg daily, metoprolol  123XX123 mg daily, folic acid 1 mg daily, Lasix 40 mg daily, Seroquel 1  tablet daily, Atacand 1 tablet, Betaseron, and Cardura 1 tablet daily.   PHYSICAL EXAMINATION:  GENERAL:  Notable for well-appearing male.  VITAL SIGNS:  Blood pressure measured 157/75, pulse 51, and oxygen  saturation 99% on room air.  HEENT:  Unrevealing.  NECK:  Supple.  There is no jugular venous distention.  CHEST:  Auscultation of the chest demonstrates clear breath sounds,  which are symmetrical bilaterally.  No wheezes or rhonchi are noted.  CARDIOVASCULAR:  Notable for regular rate and rhythm.  No murmurs, rubs,  or gallops are noted.  ABDOMEN:  Soft, nondistended, and nontender.  EXTREMITIES:  Warm and well perfused.  Distal  pulses are palpable, but  thready in the posterior tibial position.  RECTAL AND GU:  Both deferred.   DIAGNOSTIC TESTS:  CT angiogram of the thoracic aorta performed today is  reviewed and compared with last previous exam from February 28, 2007.  Today's CT angiogram reveals essentially stable radiographic appearance  of the chronic dissection involving the transverse aortic arch and  descending thoracic aorta.  The repair of the proximal ascending  thoracic aorta remains stable.  There is no sign of any false aneurysm.  There is mild aneurysmal dilatation of the dissected transverse aortic  arch, which appears stable.  The maximum transverse diameter to my exam  is 51 mm and this is unchanged from the last exam.  There has been no  significant interval progression.  On CT angiogram performed in March  2004, the same area of the transverse aortic arch measured 48 mm.   IMPRESSION:  Stable radiographic appearance of chronic aortic dissection  involving the transverse aortic arch and descending thoracic aorta,  status post hemiarch repair of the proximal aorta for acute type A  aortic dissection in February 2000.  There has been slow mild aneurysmal  dilatation of the transverse aortic arch since the patient's original  surgery more than 9 years ago.  Over the last year, there has been no  significant appreciable change.  His blood pressure is running somewhat  high in the office today, although he states that it has not been that  high at recent appointments in [name] and [name] office.   PLAN:  We will plan to see the patient back in 1 year's time for routine  followup and surveillance.  I have suggested that he may wanted to have  his blood pressure checked more frequently, so that if he has been  having some trend towards increased blood pressure, [name] may tend  to adjusting his antihypertensive medications as needed.  He understands  how important this is.  All of his  questions have been addressed.  We  will see him back in 1 year.   Valentina Gu. Roxy Manns, M.D.  Electronically Signed   CHO/MEDQ  D:  06/18/2008  T:  06/18/2008  Job:  [id number]   cc:   Lance Muss, M.D.  Alyson Locket. Love, M.D.

## 2011-01-27 NOTE — Discharge Summary (Signed)
NAMESTEFONE, [name]             ACCOUNT NO.:  [id number]   MEDICAL RECORD NO.:  [id number]          PATIENT TYPE:  INP   LOCATION:  [place]                         FACILITY:  [facility]   PHYSICIAN:  Orson Ape. Weatherly, M.D.DATE OF BIRTH:  [birth year]   DATE OF ADMISSION:  02/14/2008  DATE OF DISCHARGE:  02/17/2008                               DISCHARGE SUMMARY   DISCHARGE DIAGNOSES:  1. Severe internal and external hemorrhoids.  2. Multiple sclerosis.  3. Benign prostate hypertrophy and diffuse weakness of lower      extremities.   OPERATION:  Internal and external hemorrhoidectomy, prone position.   CONSULTATION:  Urology, Dr. Janice Norrie.   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 Katherina Right.  He has been in the hospital two  times previously this early or late spring with problems with bleeding  from massive internal and external hemorrhoids.  The hemorrhoids have  been present for years.  He used to be followed by [name].  He has  had a previous colonoscopy by Dr. Vladimir Faster and [name] is his  neurologist.  He refused hemorrhoidectomy initially.  I saw him during  these hospitalizations and his multiple sclerosis had progressed, and  [name] had treated him with acute steroids approximately 2 weeks prior  to this readmission, and he is having marked improvement of his multiple  sclerosis and he has also had some improvement in his hemorrhoids.  However, he has got massive hemorrhoids.  He has somewhat inability to  completely tighten up his anus well and the sclerosis causing the  hemorrhoids to be much more problematic.  He is admitted now for an  internal and external hemorrhoidectomy.  His blood work on this  readmission showed a white count of 6.2, hematocrit of 33.5 with  hemoglobin of 11.  His electrolytes were normal.  He was taken to  surgery.  Unasyn perioperatively, amoxicillin postoperatively and had a  formal  internal and external hemorrhoidectomy in all three quadrants.  He did not want a Foley catheter and requested that preoperatively, but  afterwards, he was definitely voiding quite frequently and a bladder  scan the following morning showed an enlarged bladder after he voided,  and he allowed me to place a Foley.  I got Dr. Burnett Harry to see him for a  urology consult, and he suggested just to leave the Foley in place.  The  patient is already on medications that are very similar to the urology  medications for BPH type of issues, and no change in these were done.  He is on Lasix 40 mg a day, Crestor, Cardura 4 mg a day, folic acid,  Norvasc 5 mg a day, Lopressor 5 mcg b.i.d., and Atacand 30 mg daily.  The patient with the Foley catheter was much less symptomatic.  The  patient is weak enough that is not able to get down actually in a  bathtub and we soaked on the toilet with sitz baths.  He was strong  enough to be discharged.  His son had come from  out of town to be with  his dad when he was discharged on February 17, 2008, and he will be followed  in our office in approximately 1 week.  [name] is possibly considering  another round of steroids, and his only new medication is on ampicillin  500 mg p.o. t.i.d. and Vicodin or Tylenol as needed for his pain.  He  does not have an issue as far as constipation.  He does take a  stool softener on a p.r.n. basis which he will continue.  The path  report of these just shows massive hemorrhoids, and the incisions appear  to be healing satisfactorily at the time of his discharge.  He will see  Dr. Janice Norrie in the early part of the coming week in about 4-5 days, and he  will see me in the office shortly at the same time.           ______________________________  Orson Ape. Rise Patience, M.D.     WJW/MEDQ  D:  03/12/2008  T:  03/12/2008  Job:  [id number]

## 2011-01-27 NOTE — Assessment & Plan Note (Signed)
OFFICE VISIT   CHRISTIOPHER, RAIOLA  DOB:  06-25-1946                                        February 28, 2007  CHART #:  [id number]   SUBJECTIVE:  Mr. Harkcom returns to the office today for routine  followup status post repair of acute type I aortic dissection with re-  suspension of the native aortic valve in February, 2000.  He has been  followed annually for several years with chronic aortic dissection  involving the transverse aortic arch and descending thoracic aorta.  He  was last seen here in the office January 04, 2006.  Since then he reports  no new significant medical problems or complaints.  He has had some  problems with some chronic skin infections particularly afflicting his  right lower extremity.  He has had some problems with stress at home.  He has not had any cardiac problems and he reports no substantial  problems with keeping his blood pressure under control, although he has  had to increase the number of medications he takes to keep his  hypertension in check.  His multiple sclerosis has remained stable.  The  remainder of his review of systems is unrevealing and the remainder of  his past medical history is unchanged.   CURRENT MEDICATIONS:  Include Norvasc, Atacand, Metoprolol, Crestor,  folic acid, Lasix, Seroquel, Betaseron, and another medication which he  does not recall the name of.   PHYSICAL EXAMINATION:  VITAL SIGNS:  Is notable for a well appearing  male with blood pressure 124/67, pulse 58 regular.  HEENT EXAM:  Is unrevealing.  There is no jugular venous distension.  There is no lymphadenopathy.  CHEST:  Auscultation of the chest demonstrates clear breath sounds which  are symmetrical.  CARDIOVASCULAR EXAM:  Regular rate and rhythm.  No murmurs, rubs or  gallops noted.  ABDOMEN:  Soft, nontender.  EXTREMITIES:  Warm and well perfused.   DIAGNOSTIC TESTS:  Chest CT angiogram of the aorta performed today at  the [facility] is reviewed and compared with the last  previous exam from 01/04/2006.  This test was also compared with an old  CT angiogram from 12/08/2002.  Today's CT angiogram reveals stable  appearance of the chronic dissection involving the transverse aortic  arch and descending thoracic aorta.  The ascending aortic graft remains  stable.  There is moderate aneurysm enlargement of the transverse aortic  arch which appears essentially stable.  There has been no significant  change in size of this over the last year.  In comparison with the exam  from 2004, the transverse diameter of the aortic arch has increased  slightly from 48 mm to 50 mm.  However, over the last year this does not  appear to be substantially different and no other abnormalities are  noted.   IMPRESSION:  Stable type I aortic dissection, status post replacement of  the ascending thoracic aorta with re-suspension of the aortic valve in  February of 2000.  There is moderate aneurysmal enlargement of the  transverse aortic arch which is chronically dissected.  This appears  stable.  Mr. Bredow appears to have his blood pressure under fairly  good control.   PLAN:  We will see Mr. Diclemente again for routine annual surveillance in  1 year.  All of his questions have  been addressed.   Valentina Gu. Roxy Manns, M.D.  Electronically Signed   CHO/MEDQ  D:  02/28/2007  T:  02/28/2007  Job:  [id number]   cc:   Lance Muss, M.D.  Lennon Alstrom, MD  Bryson Dames, M.D.

## 2011-01-27 NOTE — Consult Note (Signed)
[name], [name]             ACCOUNT NO.:  [id number]   MEDICAL RECORD NO.:  [id number]          PATIENT TYPE:  INP   LOCATION:  1443                         FACILITY:  [facility]   PHYSICIAN:  Wonda Horner, M.D.   DATE OF BIRTH:  1946/04/18   DATE OF CONSULTATION:  DATE OF DISCHARGE:                                 CONSULTATION   We were asked to see [name] today by Dr. Vladimir Faster of the  [facility] Team   HISTORY OF PRESENT ILLNESS:  This is a 65 year old male with multiple  sclerosis, who reports bright red blood per rectum in his toilet water.  He says this happens every so often, however, it happened with an  increased amount of blood last night.  Also, he had an episode of  vomiting food and multiple episodes of diarrhea that began last night.  His wife reports that she had diarrhea last week.  The patient denies  constipation.  He says that he regularly keeps his stools soft with  stool softeners.  He denies abdominal pain, heartburn or hematemesis.  He has considered having surgery for his hemorrhoids before, but does  not want to have surgery around his rectum.   PRIMARY CARE PHYSICIAN:  Dr. Lance Muss.   PAST MEDICAL HISTORY:  1. Significant for a abdominal aortic aneurysm and dissection.  He is      status post repair in 2002.  2. He has had multiple hyperplastic polyps, colon polyps.  He has also      had internal and external hemorrhoids.  These were found on      colonoscopy in June 2005, by Dr. Knox Saliva.  On his report,      there is no mention of diverticula.  3. Multiple sclerosis.  4. Hypertension.  5. Depression.  6. Wrist fracture and repair.   CURRENT MEDICATIONS:  Betaseron, Lasix, Crestor, Cardura, folic acid,  Lopressor, Atacand and Norvasc.   ALLERGIES:  HE HAS NO KNOWN DRUG ALLERGIES.   REVIEW OF SYSTEMS:  Positive for fever.  Negative for shortness of  breath and palpitations.   SOCIAL HISTORY:  Positive for occasional  alcohol.  Negative tobacco.  Negative for recreational drugs.   FAMILY HISTORY:  Positive for an uncle with colon cancer who passed and  in his 71's.   PHYSICAL EXAMINATION:  GENERAL:  He is alert and oriented.  He speaks  slowly secondary to his multiple sclerosis.  VITAL SIGNS:  His temperature is currently 103, respirations 28, pulse  72, blood pressure is 129/52.  CARDIOVASCULAR:  He has a systolic  murmur, but a regular rate and rhythm.  LUNGS:  Clear to auscultation.  ABDOMEN:  Distended and nontender with good bowel sounds.  RECTAL:  Shows multiple red swollen external hemorrhoids.  I did have  red blood and mucus on my glove when I removed it from his rectum.  There is no stool in his rectal vault.   LABORATORY DATA:  Show a hemoglobin of 11.9, white count of 14.3 with  elevated neutrophil.  He has platelets of 85,000.  PT  is 14.5, INR is  1.1.  BMET is significant for a BUN of 21, creatinine 1.41.  LFTs are  significant for a bilirubin of 1.6.   ASSESSMENT:  Dr. Wonda Horner has seen and examined the patient,  collected a history and reviewed the chart.   1. Rectal bleeding is most likely due to hemorrhoid.  Even though the      patient reports that he does not want surgery, it may be advisable      to have a surgical consult to discuss steroid injections or other      methods of treating his hemorrhoids.  2. Dehydration versus renal insufficiency.  The patient is being      admitted and started on IV fluid rehydration.  3. Vomiting, diarrhea and fever, likely of gastrointestinal viral or      infection.   PLAN:  The patient is being admitted by [facility].  Will rehydrate  gently, given suppositories and cream for his hemorrhoids, take stool  cultures to check for C diff or enteric pathogens.  The patient needs a  colonoscopy some time in the near future, either as an inpatient or an  outpatient.  Thanks very much for consultation.      Melton Alar, PA     ______________________________  Wonda Horner, M.D.    MLY/MEDQ  D:  01/09/2008  T:  01/09/2008  Job:  [id number]   cc:   Wonda Horner, M.D.  Fax: [phone]   Lance Muss, M.D.  Fax: [phone]

## 2011-01-27 NOTE — Discharge Summary (Signed)
[name], [name]             ACCOUNT NO.:  [id number]   MEDICAL RECORD NO.:  [id number]          PATIENT TYPE:  INP   LOCATION:  [id number]                         [facility]AC[facility]   PHYSICIAN:  Leana Gamer, MDDATE OF BIRTH:  Dec 21, 1945   DATE OF ADMISSION:  01/15/2008  DATE OF DISCHARGE:  01/17/2008                               DISCHARGE SUMMARY   DISCHARGE DISPOSITION:  Home.   FINAL DISCHARGE DIAGNOSES:  1. Anasarca.  2. Gastrointestinal bleeding.  3. External hemorrhoids.  4. History of hypertension.  5. History of benign prostatic hypertrophy.  6. History of anxiety.  7. History of multiple sclerosis.  8. History of insomnia.  9. Hypokalemia, resolved.  10.Hyperlipidemia.   DISCHARGE MEDICATIONS:  1. Crestor 5 mg p.o. daily.  2. Cardura 4 mg p.o. daily.  3. Atacand/hydrochlorothiazide 32/12.5 mg p.o. daily.  4. Folic acid 1 mg p.o. daily.  5. Lopressor 100 mg p.o. daily.  6. Norvasc 5 mg p.o. daily.  7. Seroquel 25-50 mg p.o. q.h.s. p.r.n.  8. Ambien 5-10 mg p.o. q.h.s. p.r.n.  9. Betaseron every other night injectable as directed.  10.Iron sulfate 325 mg p.o. b.i.d.  11.Potassium chloride 40 mEq p.o. daily.  12.Lasix 80 mg p.o. daily x3 days then resume 40 mg p.o. daily.   CONSULTANTS:  None.   PROCEDURES:  None.   DIAGNOSTIC STUDIES:  Chest x-ray, two-view, which shows cardiomegaly  without pulmonary edema.  Small bilateral effusions with bibasilar  atelectasis.   CODE STATUS:  Full code.   ALLERGIES:  NO KNOWN DRUG ALLERGIES.   PRIMARY CARE PHYSICIAN:  Lance Muss, M.D.   CHIEF COMPLAINT:  Swelling of the lower extremities and scrotum.   HISTORY OF PRESENT ILLNESS:  This is a 65 year old male who was  discharged from the hospital on the day that he was readmitted.  The  patient had been in the hospital with gastrointestinal bleeding, and his  Lasix was held while hospitalized.  On the day of discharge, the patient  required transfusion of  2 units of blood which led to fljuid overload.  The patient was offered IV lasix which he refused, stating that  resumption of his own Lasix would be adequate.  The patient was  discharged home, and when he went home, his family asked him to come  back to the emergency room due to swelling of his lower extremities.   HOSPITAL COURSE:  Problem 1.  The patient was readmitted to the hospital  and started on IV Lasix.  The patient had very effective diuresis with  the IV Lasix.  He had a BNP drawn which was noted to be 457, and at the  time of discharge it was 357.  However, the patient is not known to have  congestive heart failure.  The patient was offered an echocardiogram  while hospitalized to assess his left ventricular function, but the  patient declined, stating that he would follow up with his doctor as an  outpatient and that any necessary cardiac workup could be done at that  time.  In fact, the patient did not have any clinical  respiratory  compromise and has maintained clear lungs during his hospitalization  with no need for oxygen supplementation.  Thus, I think it is reasonable  that if the patient does not have complete resolution of his edema, his  workup can be done as an outpatient.  In addition, the patient is on  Norvasc, and as was discussed with him in the previous hospitalization,  Norvasc was known to cause fluid retention. There was a discussion about  changing his medication to an alternate form of antihypertensive.  The  patient was adamant at that time and still remains adamant that his  medications are not to be changed, including no change in his Norvasc.  Thus, the Norvasc has been maintained as one of his antihypertensive  medications.  The patient today is stable.  He has only trace edema in  his lower extremities, and the scrotal edema is markedly decreased.  The  patient is requesting to be discharged from the hospital which I think  is reasonable.  I have  discussed this with his primary care physician,  [name].  The plan at discharge is that the patient will take 80 mg of  Lasix in contrast to his usual 40 mg for a total of 3 days.  He will  then follow up with Dr. Levin Erp in the office, and decisions will be  made at that time as to the resumption of dose of his Lasix.   Problem 2.  GI bleed.  The patient did have some bright red blood per  rectum during this hospital stay.  The patient is known to have  intermittent GI bleeding secondary to external hemorrhoids.  The patient  has been counseled over and over again about the necessity of having the  hemorrhoidectomy done.  He has informed me that at this time he will  follow up with [name] and will make arrangements as an outpatient for  the hemorrhoidectomy.  I have made Dr. Rise Patience aware of this, and he  will see the patient in his office as an outpatient.  Otherwise, the  patient is stable.  His heart rates are in the range of 50s-60s on his  Lopressor 100 mg; however, the patient is completely asymptomatic  without any orthostatic symptoms and has been on this medication dose  for some time.  The patient again has reiterated that he really would  prefer that he not have a change in his medications.  In light of the  fact that the patient appears to be asymptomatic, I will not make any  modifications to his medications, and I have discussed this also with  [name] who will address it in the office.   Otherwise, the patient has been stable.  At the time of discharge, the  patient is clinically stable.   DISCHARGE PHYSICAL EXAMINATION:  GENERAL:  He is well-appearing.  VITAL SIGNS:  Heart rate 50-62, temperature 98.3, blood pressure 146/65,  O2 saturation 96% on room air.  LUNGS:  Clear to auscultation.  No wheezing or rales noted.  CARDIOVASCULAR:  Normal S1-S2.  No murmurs, rubs, or gallops noted.  ABDOMEN:  Soft, obese, nontender, nondistended.  No masses.  No   hepatosplenomegaly.  EXTREMITIES:  Trace edema in the lower extremities and just minimal  scrotal edema at this time.   LABORATORY DATA:  Today, sodium 140, potassium 3.3, chloride 107, bicarb  26, BUN 8, creatinine 1.08.  His hemoglobin is 11.9, hematocrit 35.5.  BNP is 357.  The patient's potassium will be replaced orally before  discharge.   DISPOSITION:  The patient is stable to go home.   DIET:  His dietary restrictions are a low-sodium, low-cholesterol diet.   ACTIVITY:  As tolerated.      Leana Gamer, MD  Electronically Signed     MAM/MEDQ  D:  01/16/2008  T:  01/16/2008  Job:  [id number]   cc:   Lance Muss, M.D.  Fax: [phone]

## 2011-01-27 NOTE — H&P (Signed)
[name], [name]             ACCOUNT NO.:  [id number]   MEDICAL RECORD NO.:  [id number]          PATIENT TYPE:  INP   LOCATION:  [place]                         FACILITY:  [facility]   PHYSICIAN:  Jana Hakim, M.D. DATE OF BIRTH:  03-20-1946   DATE OF ADMISSION:  01/15/2008  DATE OF DISCHARGE:  01/14/2008                              HISTORY & PHYSICAL   DATE OF ADMISSION:  Jan 15, 2008   PRIMARY CARE PHYSICIAN:  Dr. Levin Erp   CHIEF COMPLAINT:  Swelling.   HISTORY OF PRESENT ILLNESS:  This is a 65 year old male who returned to  the emergency department secondary to complaints of edema in both lower  legs up to his scrotum.  The patient reports beginning to have edema in  his right leg then his left leg for the past few days.  He denies having  any chest pain or shortness of breath.  The patient was discharged from  the hospital on May 2 after having a hospitalization for rectal bleeding  and underwent a GI evaluation by Dr. Amedeo Plenty and had a colonoscopy  performed on May 2 as well, and found to have a bleeding source of the  external hemorrhoids.  The patient also was transfused 3 units of packed  red blood cells and left with a hemoglobin level of 11.1.   PAST MEDICAL HISTORY:  Significant for:  1. Multiple sclerosis.  2. History of an aortic dissection status post repair.  3. Hypertension.  4. Hyperlipidemia.  5. Benign prostatic hypertrophy.  6. Thrombocytopenia.   MEDICATIONS:  1. Lasix 40 mg one p.o. daily.  2. Atacand HCT 32 mg one p.o. daily.  3. Crestor 5 mg one p.o. daily.  4. Cardura 4 mg one p.o. daily.  5. Folic acid 1 mg one p.o. daily.  6. Norvasc 5 mg one p.o. daily.  7. Lopressor 100 mg one p.o. b.i.d.  8. Betaseron dose not stated.   ALLERGIES:  No known drug allergies.   SOCIAL HISTORY:  The patient is married.  He is a Automotive engineer.  He  is a nonsmoker, nondrinker.   FAMILY HISTORY:  Is noncontributory.   REVIEW OF SYSTEMS:  Pertinents  are mentioned.  The patient has had no  nausea, vomiting, syncope, loss of consciousness, fever, chills,  shortness of breath.   PHYSICAL EXAMINATION FINDINGS:  This is a 65 year old obese male in  discomfort but no acute distress.  VITAL SIGNS:  Temperature 97.7, blood pressure 162/66, heart rate 86,  respirations 18, O2 saturation 98%.  HEENT:  Normocephalic, atraumatic.  There is no scleral icterus.  Pupils  are equally round, reactive to light.  Extraocular muscles are intact.  Funduscopic benign.  Oropharynx is clear.  NECK:  Supple, full range of motion.  No thyromegaly, adenopathy or  jugular venous distention.  CARDIOVASCULAR:  Regular rate and rhythm.  No murmurs, gallops or rubs.  LUNGS:  Clear to auscultation bilaterally.  No rales, rhonchi or  wheezes.  ABDOMEN:  Positive bowel sounds, soft, nontender, nondistended.  GENITOURINARY:  Positive 2+ scrotal edema.  Normal male genitalia.  EXTREMITIES:  With 2+  bilateral lower extremity edema.  NEUROLOGIC EXAMINATION:  Alert and oriented x3.  Nonfocal.   LABORATORY STUDIES:  White blood cell count 7.2, hemoglobin 11.5,  hematocrit 34.3, MCV 85.0, platelets 121, neutrophils 83%, lymphocytes  13%.  Sodium 139, potassium 3.1, chloride 115, carbon dioxide 18, BUN  12, creatinine 1.08, glucose 106, calcium level 8.4.  Myoglobin 86.3, CK-  MB 1.0, troponin less than 0.05.  Beta natriuretic peptide 455.0.  Chest  x-ray reveals cardiomegaly without pulmonary edema.  Small bilateral  pleural effusions are present with bibasilar atelectasis.   ASSESSMENT:  A 65 year old male being readmitted with:  1. Generalized edema.  2. Anemia.  3. Hypokalemia.  4. External hemorrhoids status post rectal bleeding.  5. History of multiple sclerosis.  6. Hypertension.   PLAN:  The patient will be admitted to a telemetry area.  Cardiac  enzymes will continue to be performed.  The patient will be placed on IV  Lasix therapy for diuresis and  potassium will be replaced.  His  electrolytes will be further monitored.  The patient will continue on  his regular medications.  DVT and GI prophylaxis have been ordered.      Jana Hakim, M.D.  Electronically Signed     HJ/MEDQ  D:  01/15/2008  T:  01/15/2008  Job:  [id number]   cc:   Lance Muss, M.D.  Fax: [phone]

## 2011-01-27 NOTE — Op Note (Signed)
NAMEJORDI, [name]             ACCOUNT NO.:  [id number]   MEDICAL RECORD NO.:  [id number]          PATIENT TYPE:  OIB   LOCATION:  [id number]                         FACILITY:  [facility]   PHYSICIAN:  Orson Ape. Weatherly, M.D.DATE OF BIRTH:  Mar 31, 1946   DATE OF PROCEDURE:  02/14/2008  DATE OF DISCHARGE:                               OPERATIVE REPORT   PREOPERATIVE DIAGNOSES:  1. Severe internal and external hemorrhoids.  2. Multiple sclerosis.   OPERATION:  Internal, external hemorrhoidectomy, general anesthesia,  prone position.   SURGEON:  Dr. Jeanella Anton.   HISTORY:  [name] is a 65 year old male who is followed by Dr.  Katherina Right for his multiple sclerosis, and I first met the patient  approximately 2 months ago when he was admitted here with just horrible  prolapsed, bleeding internal and external hemorrhoids.  He has had  problems with hemorrhoids for years, had a marked progression of his  multiple sclerosis and feared that the anesthesia would somehow or  another make his muscle weakness worse.  So, for that particular reason,  did not seek any type of surgical problems.  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  I saw him, and he really just refused to consider  any type of hemorrhoidectomy, said he would think about it over the next  few days.  His acute bleeding subsided.  He has had a colonoscopy, and  then he wanted to try all types of conservative management.  During that  particular hospitalization, he was released but only to be readmitted  shortly afterwards with marked swelling of the lower extremities, etc.,  and Dr. Corinna Capra was notified, and [name] placed him on a big steroid  bolus approximately 2 weeks ago, maybe 2-1/2 week ago now, and his  multiple sclerosis has improved.  His hemorrhoids also improved, but he  still has these large external and internal hemorrhoids that prolapse  and bleed easily.  We have recommended that we go ahead and proceed, and  he has kind of gradually accepted it.  I saw him in the office last  Thursday and, at that time, his hemorrhoids were the best that I have  ever seen on him, and still about as bad as any hemorrhoids that you  have ever seen.  He had laboratory studies 2 days ago, and his  hematocrit was 33, and his potassium was normal.   The patient preoperatively was given 3 g of Unasyn and taken to the OR  suite, induction of general anesthesia and switched over onto the OR  table in a prone position, the kidney being raised.  He has PAS  stockings, and we then taped to his buttocks apart and prepped his  hemorrhoids with Betadine solution and Betadine scrub.  He had had a mag  citrate bowel prep last night and is well prepped.  I first, after  draping him sterilely, used the bullet to kind of pick the pedicle in  all 3 quadrants and then first put a large 2-0 chromic suture in the  pedicle high for orientation.  I elected to do the anterior left  hemorrhoidal complex first, elevating the hemorrhoid, carefully trying  to dissect around and over the sphincter but not stretching the  sphincter since the rectal weakness is greatly improved, contributing to  his just severe hemorrhoids.  The hemorrhoid was then elevated.  I used  the kind Buie technique in trying to dissect the large hemorrhoids from  the underlying little skin bridge, and then several 3-0 chromic sutures  were used for hemostasis of that and then used the Buie clamp under the  internal pedicle and removed it.  I then started another 2-0 chromic  high and then closed the suture line a few throws over the Bowie clamp,  removing the clamp and then closing the external portion.  The first  hemorrhoid, there was a little complex  going around more laterally that  had some few little interrupted sutures.  Next, the left posterior  hemorrhoid complex which was equally large was treated in a similar  manner and I then directed my attention switching sides to the right  side, elevating it off the sphincter.  On the last hemorrhoid, I used  the little Ferguson retractor and at the completion, it would admit a  regular anoscope with ease, and all suture lines were inspected and no  evidence of any bleeding.  I then used Decubitene ointment placed into  the anal canal an open 4 x 4 to kind of keep the ointment in place,  hopefully to minimize the immediate postoperative pain.  Then, 4x4s were  placed over the anus, held in place with tape and an ABD and the little  stretch panties.  The patient was then placed supine on the stretcher  and extubated and appears to be doing nicely.  The patient will be kept  1-2 nights, and I will talk with Dr. Katherina Right on whether he wants any  additional steroids.  Estimated blood loss was probably 50 mL at max.           ______________________________  Orson Ape. Rise Patience, M.D.     WJW/MEDQ  D:  02/14/2008  T:  02/14/2008  Job:  [id number]   cc:   Lennon Alstrom, MD  Fax: [phone]

## 2011-01-27 NOTE — Consult Note (Signed)
NAMEREHAAN, PANA             ACCOUNT NO.:  [id number]   MEDICAL RECORD NO.:  [id number]          PATIENT TYPE:  INP   LOCATION:  1339                         FACILITY:  [facility]   PHYSICIAN:  Hanley Ben, M.D.  DATE OF BIRTH:  [birth year]   DATE OF CONSULTATION:  02/16/2008  DATE OF DISCHARGE:                                 CONSULTATION   REASON FOR CONSULTATION:  Difficulty voiding.   The patient is 65 year old male who had hemorrhoidectomy on February 14, 2008.  He has been having difficulty voiding.  He has frequency,  hesitancy and voiding small amount of urine at a time.  Bladder  ultrasound today showed 650 mL of residual urine.  The patient has a  history of MS, diagnosed about 12 years ago, and is followed by Dr.  [name].  He also has been on Cardura 4 mg daily.  He was in the hospital  at the end of April 2009 for rectal bleeding.  At that time he was  catheterized, and after the catheterization he started having dysuria  and frequency.  Prior to that episode he had not had any voiding  difficulties.  A Foley catheter was then left indwelling today and the  catheter is now draining clear urine.   PAST MEDICAL HISTORY:  Positive for multiple sclerosis, hypertension,  hyperlipidemia.   PAST SURGICAL HISTORY:  He had repair of thoracic aortic dissection.   SOCIAL HISTORY:  He is married and has 2 children.  He does not smoke  nor drink.   FAMILY HISTORY:  His father died when he was young, and he had thyroid  cancer.  His mother died of heart attack and had hypertension.  He had  one brother with died of lung cancer and also had thyroid cancer.   MEDICATIONS:  1. Lasix 40 mg daily.  2. Atacand 30 mg daily.  3. Crestor 5 mg daily.  4. Cardura 4 mg daily.  5. Folic acid 1 mg daily.  6. Norvasc 5 mg daily.  7. Lopressor 100 mg twice a day.   ALLERGIES:  NO KNOWN DRUG ALLERGIES.   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   REVIEW OF SYSTEMS:  Is as  noted in the HPI and everything else is  negative.   PHYSICAL EXAMINATION:  This is a well-developed 65 year old male who is  in no acute distress.  His blood pressure is 157/80, pulse 77,  respirations 18 and temperature 99.4.  He is alert and oriented to time,  place and person.  SKIN:  Warm and dry.  ABDOMEN:  Soft, nondistended and nontender.  He has no CVA tenderness.  Kidneys are not palpable.  He has no hepatomegaly and no splenomegaly.  Bladder is not distended.  GU:  The penis is circumcised.  Meatus is normal.  He has a Foley  catheter that is draining clear urine.  Scrotum is normal.  He has no  hydrocele and no testicular mass.  Cords and epididymis are within  normal limits.  He has no inguinal adenopathy.  I attempted to do a  rectal examination; however,  he had a recent hemorrhoidectomy and I did  not complete the examination.  His sphincter tone appears to be weak and  I was not able to feel his prostate.   Hemoglobin 9.4, hematocrit 28.0, WBC 6.3, BUN 12 (on February 15, 2008),  creatinine 1.05.  Urinalysis today shows no RBCs or WBCs.   IMPRESSION:  1. Urinary retention, probably neurogenic in origin.  2. Status post hemorrhoidectomy.   SUGGESTIONS:  Leave Foley catheter indwelling.  Continue Cardura.  I  will see the patient in the office next week for voiding trial.      Hanley Ben, M.D.  Electronically Signed     MN/MEDQ  D:  02/16/2008  T:  02/17/2008  Job:  [id number]

## 2011-01-30 NOTE — Op Note (Signed)
NAMETERRIAL, MIZER             ACCOUNT NO.:  [id number]   MEDICAL RECORD NO.:  [id number]          PATIENT TYPE:  OIB   LOCATION:  5009                         FACILITY:  [facility]   PHYSICIAN:  [name], M.D.DATE OF BIRTH:  1945-12-02   DATE OF PROCEDURE:  07/24/2005  DATE OF DISCHARGE:                                 OPERATIVE REPORT   PREOPERATIVE DIAGNOSIS:  Right comminuted intra-articular distal radius  fracture with displacement.   POSTOPERATIVE DIAGNOSIS:  Right comminuted intra-articular distal radius  fracture with displacement.   PROCEDURE:  Open reduction and internal fixation of right distal radius  fracture using [employer] distal radius volar locking plate.   SURGEON:  Timothy Guest. Ninfa Linden, MD   ANESTHESIA:  General.   TOURNIQUET TIME:  One hour and 46 minutes.   ANTIBIOTICS:  One gram of IV Ancef.   COMPLICATIONS:  None.   BLOOD LOSS:  Minimal.   INDICATIONS:  Briefly, [name] is a 25-year right-hand-dominant  gentleman with a medical history significant for multiple sclerosis.  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  He had a mechanical  fall in a gym a few days ago and he landed on his outstretched right wrist.  He was found to have a displaced distal radius fracture.  He was placed in a  sugar-tong splint and given followup in my clinic.  In the clinic, he denied  numbness and tingling in his hand.  X-rays were reviewed with him and showed  an intra-articular displaced distal radius fracture.  It was recommended he  undergo open reduction and internal fixation, given this being his dominant  hand and he uses it quite readily.  Even with his MS, he still works as a  Network engineer and drives as well.  The risks and benefits of this were explained  to him and he agrees to proceed with surgery.   PROCEDURE DESCRIPTION:  After informed consent was obtained and the  appropriate right arm was marked, he was  brought to the operating room and  placed supine on the operating table.  General anesthesia was obtained.  The  splint was removed from his arm and the skin was found to be intact.  A  nonsterile tourniquet was placed around his upper arm and his arm was  prepped with DuraPrep and sterile drapes were placed.  An Esmarch was used  wrap out the arm and then the tourniquet was inflated to 250 mm of pressure.  A volar approach to the wrist was taken in the interval between the flexor  carpi radialis and radial artery.  Meticulous dissection was carried out,  protecting the radial artery and the tendons; this was taken down to the  level of the pronator quadratus and the pronator quadratus was taken as  sleeve from radial to ulnar.  The fracture was exposed and was irrigated and  cleaned of fracture debris.  Gentle traction was applied and the wrist was  manipulated into a reduced position.  A standard [employer] DVR volar  plate was then fashioned along the volar cortex  of the distal radius.  It  was secured proximally with a slotted hole with a 12-mm bicortical screw,  then with gentle reduction maneuver, the fracture fragments were teased in  place, secured with K-wires for provisional fixation and then locking pegs  were placed along the most distal row and proximal row, as well as a screw  in the radial styloid piece.  Due to the intermediate column/ulnar column of  the distal radius having a depressed piece, I wanted to tease the plate a  little more to the ulnar column to affect this place.  Then with this, I  felt the need to put a supplemental screw outside of the plate in radial  styloid piece to help secure this.  A fully threaded bicortical screw was  placed outside of the parameters of the plate into the radial styloid piece  from a volar aspect to hold this in place; this was all performed under  direct fluoroscopic guidance and the wrist was found to be in a reduced   position.  I put it through pronation, supination, flexion and extension,  and the reduction was held, and I did this under live fluoroscopy as well.  The wound was then copiously irrigated and the pronator quadratus was  reapproximated into position over the plate with 0 Vicryl suture followed by  2-0 Vicryl in the subcutaneous tissue.  The skin was reapproximated with 3-0  Prolene suture in a simple format.  A well-padded sterile dressing was  applied followed by a plaster volar short arm splint.  The tourniquet was  let down at 1 hour and 46 minutes, and the fingers did pink up nicely.  The  patient was awakened, extubated and taken to the recovery room in stable  condition.           ______________________________  [name], M.D.     CYB/MEDQ  D:  07/24/2005  T:  07/25/2005  Job:  [id number]

## 2011-06-09 LAB — CBC
HCT: 30.8 — ABNORMAL LOW
HCT: 31.2 — ABNORMAL LOW
HCT: 31.6 — ABNORMAL LOW
HCT: 32 — ABNORMAL LOW
HCT: 35.3 — ABNORMAL LOW
Hemoglobin: 10.3 — ABNORMAL LOW
Hemoglobin: 10.4 — ABNORMAL LOW
Hemoglobin: 10.4 — ABNORMAL LOW
Hemoglobin: 10.8 — ABNORMAL LOW
Hemoglobin: 11.9 — ABNORMAL LOW
MCHC: 32.5
MCHC: 33.2
MCHC: 33.8
MCHC: 33.9
MCHC: 33.9
MCV: 83.9
MCV: 84.1
MCV: 84.5
MCV: 84.6
MCV: 85.1
Platelets: 48 — CL
Platelets: 60 — ABNORMAL LOW
Platelets: 61 — ABNORMAL LOW
Platelets: 69 — ABNORMAL LOW
Platelets: 85 — ABNORMAL LOW
RBC: 3.66 — ABNORMAL LOW
RBC: 3.69 — ABNORMAL LOW
RBC: 3.71 — ABNORMAL LOW
RBC: 3.81 — ABNORMAL LOW
RBC: 4.17 — ABNORMAL LOW
RDW: 13.9
RDW: 15
RDW: 15.4
RDW: 15.5
RDW: 15.6 — ABNORMAL HIGH
WBC: 14.3 — ABNORMAL HIGH
WBC: 6.2
WBC: 6.9
WBC: 8.5
WBC: 8.7

## 2011-06-09 LAB — BASIC METABOLIC PANEL
BUN: 20
BUN: 20
CO2: 23
CO2: 25
Calcium: 8.2 — ABNORMAL LOW
Calcium: 8.2 — ABNORMAL LOW
Chloride: 111
Chloride: 113 — ABNORMAL HIGH
Creatinine, Ser: 1.47
Creatinine, Ser: 1.76 — ABNORMAL HIGH
GFR calc Af Amer: 48 — ABNORMAL LOW
GFR calc Af Amer: 59 — ABNORMAL LOW
GFR calc non Af Amer: 40 — ABNORMAL LOW
GFR calc non Af Amer: 49 — ABNORMAL LOW
Glucose, Bld: 120 — ABNORMAL HIGH
Glucose, Bld: 122 — ABNORMAL HIGH
Potassium: 3.7
Potassium: 4
Sodium: 141
Sodium: 141

## 2011-06-09 LAB — CLOSTRIDIUM DIFFICILE EIA: C difficile Toxins A+B, EIA: NEGATIVE

## 2011-06-09 LAB — DIFFERENTIAL
Basophils Absolute: 0
Basophils Absolute: 0
Basophils Absolute: 0
Basophils Absolute: 0
Basophils Absolute: 0.3 — ABNORMAL HIGH
Basophils Relative: 0
Basophils Relative: 0
Basophils Relative: 0
Basophils Relative: 0
Basophils Relative: 2 — ABNORMAL HIGH
Eosinophils Absolute: 0
Eosinophils Absolute: 0
Eosinophils Absolute: 0
Eosinophils Absolute: 0
Eosinophils Absolute: 0
Eosinophils Relative: 0
Eosinophils Relative: 0
Eosinophils Relative: 0
Eosinophils Relative: 0
Eosinophils Relative: 0
Lymphocytes Relative: 10 — ABNORMAL LOW
Lymphocytes Relative: 12
Lymphocytes Relative: 2 — ABNORMAL LOW
Lymphocytes Relative: 5 — ABNORMAL LOW
Lymphocytes Relative: 7 — ABNORMAL LOW
Lymphs Abs: 0.3 — ABNORMAL LOW
Lymphs Abs: 0.4 — ABNORMAL LOW
Lymphs Abs: 0.6 — ABNORMAL LOW
Lymphs Abs: 0.7
Lymphs Abs: 0.7
Monocytes Absolute: 0.4
Monocytes Absolute: 0.5
Monocytes Absolute: 0.7
Monocytes Absolute: 0.7
Monocytes Absolute: 0.7
Monocytes Relative: 10
Monocytes Relative: 4
Monocytes Relative: 5
Monocytes Relative: 8
Monocytes Relative: 8
Neutro Abs: 13 — ABNORMAL HIGH
Neutro Abs: 5
Neutro Abs: 5.5
Neutro Abs: 7.3
Neutro Abs: 7.9 — ABNORMAL HIGH
Neutrophils Relative %: 80 — ABNORMAL HIGH
Neutrophils Relative %: 80 — ABNORMAL HIGH
Neutrophils Relative %: 86 — ABNORMAL HIGH
Neutrophils Relative %: 91 — ABNORMAL HIGH
Neutrophils Relative %: 91 — ABNORMAL HIGH

## 2011-06-09 LAB — URINALYSIS, ROUTINE W REFLEX MICROSCOPIC
Bilirubin Urine: NEGATIVE
Glucose, UA: NEGATIVE
Hgb urine dipstick: NEGATIVE
Leukocytes, UA: NEGATIVE
Nitrite: NEGATIVE
Protein, ur: 100 — AB
Specific Gravity, Urine: 1.027
Urobilinogen, UA: 1
pH: 6

## 2011-06-09 LAB — PREPARE PLATELET PHERESIS

## 2011-06-09 LAB — STOOL CULTURE

## 2011-06-09 LAB — COMPREHENSIVE METABOLIC PANEL
ALT: 27
AST: 29
Albumin: 3.5
Alkaline Phosphatase: 66
BUN: 21
CO2: 28
Calcium: 8.8
Chloride: 110
Creatinine, Ser: 1.41
GFR calc Af Amer: >60
GFR calc non Af Amer: 51 — ABNORMAL LOW
Glucose, Bld: 117 — ABNORMAL HIGH
Potassium: 4.3
Sodium: 143
Total Bilirubin: 1.6 — ABNORMAL HIGH
Total Protein: 6.3

## 2011-06-09 LAB — OVA AND PARASITE EXAMINATION: Ova and parasites: NONE SEEN

## 2011-06-09 LAB — CROSSMATCH
ABO/RH(D): O POS
Antibody Screen: NEGATIVE

## 2011-06-09 LAB — HEPATIC FUNCTION PANEL
ALT: 22
AST: 32
Albumin: 2.5 — ABNORMAL LOW
Alkaline Phosphatase: 80
Bilirubin, Direct: 0.2
Indirect Bilirubin: 0.7
Total Bilirubin: 0.9
Total Protein: 5.6 — ABNORMAL LOW

## 2011-06-09 LAB — URINE MICROSCOPIC-ADD ON

## 2011-06-09 LAB — CK: Total CK: 61

## 2011-06-09 LAB — PROTIME-INR
INR: 1.1
INR: 1.2
Prothrombin Time: 14.5
Prothrombin Time: 15.3 — ABNORMAL HIGH

## 2011-06-09 LAB — TSH: TSH: 0.849

## 2011-06-09 LAB — URINE CULTURE: Colony Count: 35000

## 2011-06-09 LAB — TYPE AND SCREEN
ABO/RH(D): O POS
Antibody Screen: NEGATIVE

## 2011-06-09 LAB — CULTURE, BLOOD (ROUTINE X 2)
Culture: NO GROWTH
Culture: NO GROWTH

## 2011-06-09 LAB — APTT
aPTT: 33
aPTT: 38 — ABNORMAL HIGH

## 2011-06-09 LAB — IRON AND TIBC
Iron: <10 — ABNORMAL LOW
UIBC: 197

## 2011-06-09 LAB — VITAMIN B12: Vitamin B-12: 224 (ref 211–911)

## 2011-06-09 LAB — HEMOGLOBIN AND HEMATOCRIT, BLOOD
HCT: 30.1 — ABNORMAL LOW
HCT: 31.7 — ABNORMAL LOW
HCT: 33.2 — ABNORMAL LOW
Hemoglobin: 10.2 — ABNORMAL LOW
Hemoglobin: 10.7 — ABNORMAL LOW
Hemoglobin: 11.1 — ABNORMAL LOW

## 2011-06-09 LAB — ABO/RH: ABO/RH(D): O POS

## 2011-06-09 LAB — FERRITIN: Ferritin: 114 (ref 22–322)

## 2011-06-09 LAB — RETICULOCYTES
RBC.: 3.75 — ABNORMAL LOW
Retic Count, Absolute: 15 — ABNORMAL LOW
Retic Ct Pct: 0.4

## 2011-06-09 LAB — FOLATE: Folate: 17.6

## 2011-06-09 LAB — SODIUM, URINE, RANDOM: Sodium, Ur: <10

## 2011-06-10 LAB — COMPREHENSIVE METABOLIC PANEL
ALT: 28
AST: 18
Albumin: 2.9 — ABNORMAL LOW
Alkaline Phosphatase: 82
BUN: 10
CO2: 26
Calcium: 8.5
Chloride: 106
Creatinine, Ser: 1.07
GFR calc Af Amer: >60
GFR calc non Af Amer: >60
Glucose, Bld: 99
Potassium: 3.8
Sodium: 139
Total Bilirubin: 1.2
Total Protein: 6

## 2011-06-10 LAB — DIFFERENTIAL
Basophils Absolute: 0
Basophils Relative: 0
Eosinophils Absolute: 0
Eosinophils Relative: 0
Lymphocytes Relative: 12
Lymphs Abs: 0.7
Monocytes Absolute: 0.4
Monocytes Relative: 7
Neutro Abs: 5
Neutrophils Relative %: 81 — ABNORMAL HIGH

## 2011-06-10 LAB — CBC
HCT: 33.5 — ABNORMAL LOW
Hemoglobin: 11.1 — ABNORMAL LOW
MCHC: 33.3
MCV: 87.4
Platelets: 75 — ABNORMAL LOW
RBC: 3.83 — ABNORMAL LOW
RDW: 17.8 — ABNORMAL HIGH
WBC: 6.2

## 2011-06-11 LAB — URINALYSIS, ROUTINE W REFLEX MICROSCOPIC
Bilirubin Urine: NEGATIVE
Glucose, UA: 100 — AB
Hgb urine dipstick: NEGATIVE
Ketones, ur: NEGATIVE
Nitrite: NEGATIVE
Protein, ur: NEGATIVE
Specific Gravity, Urine: 1.011
Urobilinogen, UA: 0.2
pH: 8

## 2011-06-11 LAB — CBC
HCT: 25.9 — ABNORMAL LOW
HCT: 28 — ABNORMAL LOW
Hemoglobin: 8.9 — ABNORMAL LOW
Hemoglobin: 9.4 — ABNORMAL LOW
MCHC: 33.5
MCHC: 34.1
MCV: 86.2
MCV: 86.7
Platelets: 140 — ABNORMAL LOW
Platelets: 150
RBC: 3.01 — ABNORMAL LOW
RBC: 3.23 — ABNORMAL LOW
RDW: 17.5 — ABNORMAL HIGH
RDW: 17.6 — ABNORMAL HIGH
WBC: 6.3
WBC: 7.9

## 2011-06-11 LAB — BASIC METABOLIC PANEL
BUN: 12
CO2: 27
Calcium: 8.2 — ABNORMAL LOW
Chloride: 106
Creatinine, Ser: 1.05
GFR calc Af Amer: >60
GFR calc non Af Amer: >60
Glucose, Bld: 134 — ABNORMAL HIGH
Potassium: 3.8
Sodium: 140

## 2011-06-11 LAB — PREPARE PLATELET PHERESIS

## 2011-06-11 LAB — URINE CULTURE
Colony Count: NO GROWTH
Culture: NO GROWTH
Special Requests: NEGATIVE

## 2011-07-20 ENCOUNTER — Ambulatory Visit: Payer: BC Managed Care – PPO | Attending: Neurology | Admitting: Physical Therapy

## 2011-07-20 DIAGNOSIS — R269 Unspecified abnormalities of gait and mobility: Secondary | ICD-10-CM | POA: Insufficient documentation

## 2011-07-20 DIAGNOSIS — M6281 Muscle weakness (generalized): Secondary | ICD-10-CM | POA: Insufficient documentation

## 2011-07-20 DIAGNOSIS — IMO0001 Reserved for inherently not codable concepts without codable children: Secondary | ICD-10-CM | POA: Insufficient documentation

## 2011-07-26 ENCOUNTER — Other Ambulatory Visit: Payer: Self-pay | Admitting: Internal Medicine

## 2011-07-26 DIAGNOSIS — D696 Thrombocytopenia, unspecified: Secondary | ICD-10-CM | POA: Insufficient documentation

## 2011-07-27 ENCOUNTER — Telehealth: Payer: Self-pay | Admitting: Internal Medicine

## 2011-07-27 NOTE — Telephone Encounter (Signed)
Called pt ,left message , appt for 11/26 3pm lab and MD

## 2011-07-31 ENCOUNTER — Ambulatory Visit: Payer: BC Managed Care – PPO | Admitting: Physical Therapy

## 2011-08-02 ENCOUNTER — Encounter: Payer: Self-pay | Admitting: Internal Medicine

## 2011-08-04 ENCOUNTER — Ambulatory Visit: Payer: BC Managed Care – PPO | Admitting: Physical Therapy

## 2011-08-10 ENCOUNTER — Telehealth: Payer: Self-pay | Admitting: Internal Medicine

## 2011-08-10 ENCOUNTER — Other Ambulatory Visit (HOSPITAL_BASED_OUTPATIENT_CLINIC_OR_DEPARTMENT_OTHER): Payer: BC Managed Care – PPO | Admitting: Lab

## 2011-08-10 ENCOUNTER — Ambulatory Visit (HOSPITAL_BASED_OUTPATIENT_CLINIC_OR_DEPARTMENT_OTHER): Payer: BC Managed Care – PPO | Admitting: Internal Medicine

## 2011-08-10 VITALS — BP 141/70 | HR 52 | Temp 98.0°F | Ht 68.0 in | Wt 175.0 lb

## 2011-08-10 DIAGNOSIS — D649 Anemia, unspecified: Secondary | ICD-10-CM

## 2011-08-10 DIAGNOSIS — D696 Thrombocytopenia, unspecified: Secondary | ICD-10-CM

## 2011-08-10 DIAGNOSIS — G35 Multiple sclerosis: Secondary | ICD-10-CM

## 2011-08-10 DIAGNOSIS — D693 Immune thrombocytopenic purpura: Secondary | ICD-10-CM

## 2011-08-10 DIAGNOSIS — R5381 Other malaise: Secondary | ICD-10-CM

## 2011-08-10 DIAGNOSIS — R5383 Other fatigue: Secondary | ICD-10-CM

## 2011-08-10 LAB — CBC WITH DIFFERENTIAL/PLATELET
BASO%: 0.1 % (ref 0.0–2.0)
Basophils Absolute: 0 10*3/uL (ref 0.0–0.1)
EOS%: 0.7 % (ref 0.0–7.0)
Eosinophils Absolute: 0 10*3/uL (ref 0.0–0.5)
HCT: 37.6 % — ABNORMAL LOW (ref 38.4–49.9)
HGB: 12.2 g/dL — ABNORMAL LOW (ref 13.0–17.1)
LYMPH%: 20.8 % (ref 14.0–49.0)
MCH: 28 pg (ref 27.2–33.4)
MCHC: 32.5 g/dL (ref 32.0–36.0)
MCV: 86 fL (ref 79.3–98.0)
MONO#: 0.4 10*3/uL (ref 0.1–0.9)
MONO%: 8 % (ref 0.0–14.0)
NEUT#: 3.8 10*3/uL (ref 1.5–6.5)
NEUT%: 70.4 % (ref 39.0–75.0)
Platelets: 109 10*3/uL — ABNORMAL LOW (ref 140–400)
RBC: 4.37 10*6/uL (ref 4.20–5.82)
RDW: 13.5 % (ref 11.0–14.6)
WBC: 5.4 10*3/uL (ref 4.0–10.3)
lymph#: 1.1 10*3/uL (ref 0.9–3.3)

## 2011-08-10 LAB — COMPREHENSIVE METABOLIC PANEL
ALT: 22 U/L (ref 0–53)
AST: 24 U/L (ref 0–37)
Albumin: 3.6 g/dL (ref 3.5–5.2)
Alkaline Phosphatase: 296 U/L — ABNORMAL HIGH (ref 39–117)
BUN: 23 mg/dL (ref 6–23)
CO2: 27 mEq/L (ref 19–32)
Calcium: 10 mg/dL (ref 8.4–10.5)
Chloride: 105 mEq/L (ref 96–112)
Creatinine, Ser: 1.07 mg/dL (ref 0.50–1.35)
Glucose, Bld: 90 mg/dL (ref 70–99)
Potassium: 4.1 mEq/L (ref 3.5–5.3)
Sodium: 140 mEq/L (ref 135–145)
Total Bilirubin: 0.4 mg/dL (ref 0.3–1.2)
Total Protein: 7.4 g/dL (ref 6.0–8.3)

## 2011-08-10 NOTE — Telephone Encounter (Signed)
gve the pt his may 2013 appt calendar

## 2011-08-10 NOTE — Progress Notes (Signed)
[facility] OFFICE PROGRESS NOTE  DIAGNOSIS: Idiopathic thrombocytopenic purpura.  PRIOR THERAPY: None  CURRENT THERAPY: Observation  INTERVAL HISTORY: [name] 65 y.o. male returns to the clinic today for followup visit accompanied by his wife. The patient was seen for initial evaluation on 03/12/2010. [place] then went [place] almost [place]. [place] of [place]. Has been doing fine with no complaints except for fatigue which the patient related to his work schedule. He came back today for evaluation and repeat blood work. The patient has been on Betaseron for his multiple sclerosis. He has no bleeding issues, no bruises or ecchymosis.  MEDICAL HISTORY: Past Medical History  Diagnosis Date  . Neuromuscular disorder   . Hypertension   . Anemia   . Depression   . Multiple sclerosis   . Thrombocytopenia   . Dyslipidemia   . Aortic dissection, thoracic   . BPH (benign prostatic hyperplasia)   . Hemorrhoids     ALLERGIES:   has no known allergies.  MEDICATIONS:  Current Outpatient Prescriptions  Medication Sig Dispense Refill  . amLODipine (NORVASC) 5 MG tablet Take 5 mg by mouth daily.        Marland Kitchen doxazosin (CARDURA) 4 MG tablet Take 4 mg by mouth at bedtime.        . folic acid (FOLVITE) 1 MG tablet Take 1 mg by mouth daily.        . furosemide (LASIX) 40 MG tablet Take 40 mg by mouth daily.        . interferon beta-1b (BETASERON) 0.3 MG injection Inject 0.3 mg into the skin every other day.        . metoprolol (LOPRESSOR) 100 MG tablet Take 100 mg by mouth 2 (two) times daily.        . QUEtiapine (SEROQUEL) 25 MG tablet Take 25 mg by mouth at bedtime. prn       . rosuvastatin (CRESTOR) 5 MG tablet Take 5 mg by mouth daily.        . valsartan-hydrochlorothiazide (DIOVAN-HCT) 320-12.5 MG per tablet Take 1 tablet by mouth daily.        Marland Kitchen docusate sodium (COLACE) 100 MG capsule Take 100 mg by mouth daily.        . sildenafil (VIAGRA) 100  MG tablet Take 100 mg by mouth daily as needed.          REVIEW OF SYSTEMS:  A comprehensive review of systems was negative except for: Constitutional: positive for fatigue   PHYSICAL EXAMINATION: General appearance: alert, cooperative and no distress Head: Normocephalic, without obvious abnormality, atraumatic Neck: no adenopathy Lymph nodes: Cervical, supraclavicular, and axillary nodes normal. Resp: clear to auscultation bilaterally Cardio: regular rate and rhythm, S1, S2 normal, no murmur, click, rub or gallop GI: soft, non-tender; bowel sounds normal; no masses,  no organomegaly Extremities: extremities normal, atraumatic, no cyanosis or edema Neurologic: Alert and oriented X 3, normal strength and tone. Normal symmetric reflexes. Normal coordination and gait  ECOG PERFORMANCE STATUS: 1 - Symptomatic but completely ambulatory  Blood pressure 141/70, pulse 52, temperature 98 F (36.7 C), temperature source Oral, height 5\' 8"  (1.727 m), weight 175 lb (79.379 kg).  LABORATORY DATA: Lab Results  Component Value Date   WBC 5.4 08/10/2011   HGB 12.2* 08/10/2011   HCT 37.6* 08/10/2011   MCV 86.0 08/10/2011   PLT 109* 08/10/2011      Chemistry      Component Value  Date/Time   NA 143 03/12/2010 1337   K 4.3 03/12/2010 1337   CL 104 03/12/2010 1337   CO2 26 03/12/2010 1337   BUN 25* 03/12/2010 1337   CREATININE 1.24 03/12/2010 1337      Component Value Date/Time   CALCIUM 10.1 03/12/2010 1337   ALKPHOS 162* 03/12/2010 1337   AST 23 03/12/2010 1337   ALT 19 03/12/2010 1337   BILITOT 1.0 03/12/2010 1337     ASSESSMENT: This is a very pleasant 65 years old white male with idiopathic thrombocytopenic purpura, currently on observation. The patient is doing fine and he has no significant decline in his platelet count. He also has mild anemia. I discussed you that result with the patient and his wife.  PLAN: I recommend for him continuous observation for now with repeat CBC and cMET in 6  months. The patient was advised to call me immediately she has any bleeding issues, bruises or ecchymosis in the interval.  All questions were answered. The patient knows to call the clinic with any problems, questions or concerns. We can certainly see the patient much sooner if necessary.

## 2011-08-11 ENCOUNTER — Ambulatory Visit: Payer: BC Managed Care – PPO | Admitting: Physical Therapy

## 2011-08-13 ENCOUNTER — Ambulatory Visit: Payer: BC Managed Care – PPO | Admitting: Physical Therapy

## 2011-08-18 ENCOUNTER — Ambulatory Visit: Payer: BC Managed Care – PPO | Attending: Neurology | Admitting: Physical Therapy

## 2011-08-18 DIAGNOSIS — IMO0001 Reserved for inherently not codable concepts without codable children: Secondary | ICD-10-CM | POA: Insufficient documentation

## 2011-08-18 DIAGNOSIS — M6281 Muscle weakness (generalized): Secondary | ICD-10-CM | POA: Insufficient documentation

## 2011-08-18 DIAGNOSIS — R269 Unspecified abnormalities of gait and mobility: Secondary | ICD-10-CM | POA: Insufficient documentation

## 2011-08-20 ENCOUNTER — Ambulatory Visit: Payer: BC Managed Care – PPO | Admitting: Physical Therapy

## 2011-08-25 ENCOUNTER — Ambulatory Visit: Payer: BC Managed Care – PPO | Admitting: Physical Therapy

## 2011-08-27 ENCOUNTER — Ambulatory Visit: Payer: BC Managed Care – PPO | Admitting: Physical Therapy

## 2011-09-01 ENCOUNTER — Ambulatory Visit: Payer: BC Managed Care – PPO | Admitting: Physical Therapy

## 2011-09-03 ENCOUNTER — Ambulatory Visit: Payer: BC Managed Care – PPO | Admitting: Physical Therapy

## 2012-02-09 ENCOUNTER — Other Ambulatory Visit: Payer: Self-pay | Admitting: *Deleted

## 2012-02-09 ENCOUNTER — Telehealth: Payer: Self-pay | Admitting: Internal Medicine

## 2012-02-09 NOTE — Telephone Encounter (Signed)
wife aware of appt for lab ans she states that she will advise him of his appt aom

## 2012-02-10 ENCOUNTER — Other Ambulatory Visit (HOSPITAL_BASED_OUTPATIENT_CLINIC_OR_DEPARTMENT_OTHER): Payer: BC Managed Care – PPO | Admitting: Lab

## 2012-02-10 ENCOUNTER — Ambulatory Visit (HOSPITAL_BASED_OUTPATIENT_CLINIC_OR_DEPARTMENT_OTHER): Payer: BC Managed Care – PPO | Admitting: Internal Medicine

## 2012-02-10 ENCOUNTER — Telehealth: Payer: Self-pay | Admitting: Internal Medicine

## 2012-02-10 VITALS — BP 121/53 | HR 75 | Temp 97.5°F | Ht 68.0 in | Wt 182.9 lb

## 2012-02-10 DIAGNOSIS — D696 Thrombocytopenia, unspecified: Secondary | ICD-10-CM

## 2012-02-10 LAB — CBC WITH DIFFERENTIAL/PLATELET
BASO%: 0.5 % (ref 0.0–2.0)
Basophils Absolute: 0 10*3/uL (ref 0.0–0.1)
EOS%: 2.2 % (ref 0.0–7.0)
Eosinophils Absolute: 0.1 10*3/uL (ref 0.0–0.5)
HCT: 37.5 % — ABNORMAL LOW (ref 38.4–49.9)
HGB: 12.1 g/dL — ABNORMAL LOW (ref 13.0–17.1)
LYMPH%: 19.3 % (ref 14.0–49.0)
MCH: 27.1 pg — ABNORMAL LOW (ref 27.2–33.4)
MCHC: 32.2 g/dL (ref 32.0–36.0)
MCV: 84 fL (ref 79.3–98.0)
MONO#: 0.4 10*3/uL (ref 0.1–0.9)
MONO%: 8.9 % (ref 0.0–14.0)
NEUT#: 3.2 10*3/uL (ref 1.5–6.5)
NEUT%: 69.1 % (ref 39.0–75.0)
Platelets: 63 10*3/uL — ABNORMAL LOW (ref 140–400)
RBC: 4.47 10*6/uL (ref 4.20–5.82)
RDW: 14.7 % — ABNORMAL HIGH (ref 11.0–14.6)
WBC: 4.6 10*3/uL (ref 4.0–10.3)
lymph#: 0.9 10*3/uL (ref 0.9–3.3)
nRBC: 0 % (ref 0–0)

## 2012-02-10 LAB — COMPREHENSIVE METABOLIC PANEL
ALT: 33 U/L (ref 0–53)
AST: 36 U/L (ref 0–37)
Albumin: 4 g/dL (ref 3.5–5.2)
Alkaline Phosphatase: 253 U/L — ABNORMAL HIGH (ref 39–117)
BUN: 20 mg/dL (ref 6–23)
CO2: 29 mEq/L (ref 19–32)
Calcium: 8.9 mg/dL (ref 8.4–10.5)
Chloride: 106 mEq/L (ref 96–112)
Creatinine, Ser: 1.2 mg/dL (ref 0.50–1.35)
Glucose, Bld: 79 mg/dL (ref 70–99)
Potassium: 3.7 mEq/L (ref 3.5–5.3)
Sodium: 142 mEq/L (ref 135–145)
Total Bilirubin: 0.9 mg/dL (ref 0.3–1.2)
Total Protein: 6.4 g/dL (ref 6.0–8.3)

## 2012-02-10 NOTE — Telephone Encounter (Signed)
appts made and printed for  pt

## 2012-02-10 NOTE — Progress Notes (Signed)
[facility] Telephone:([phone]   [phone]  OFFICE PROGRESS NOTE  GREEN, Timothy Bachelor, MD, MD [address], [address] [place] [address]  DIAGNOSIS: Idiopathic thrombocytopenic purpura.   PRIOR THERAPY: None   CURRENT THERAPY: Observation  INTERVAL HISTORY: [name] 66 y.o. male returns to the clinic today for routine six-month followup visit. The patient is feeling fine today with no specific complaints. He is currently on Betaseron and Ampyra for multiple sclerosis. He denied having any significant weight loss or night sweats, no bleeding, bruises or ecchymosis. He denied having any significant chest pain or shortness of breath. The patient has repeat CBC performed earlier today and he is here for evaluation and discussion of his lab results.  MEDICAL HISTORY: Past Medical History  Diagnosis Date  . Neuromuscular disorder   . Hypertension   . Anemia   . Depression   . Multiple sclerosis   . Thrombocytopenia   . Dyslipidemia   . Aortic dissection, thoracic   . BPH (benign prostatic hyperplasia)   . Hemorrhoids     ALLERGIES:   has no known allergies.  MEDICATIONS:  Current Outpatient Prescriptions  Medication Sig Dispense Refill  . amLODipine (NORVASC) 5 MG tablet Take 5 mg by mouth daily.        . Dalfampridine (AMPYRA PO) Take 2 tablets by mouth daily. Pt unsure of dose      . doxazosin (CARDURA) 4 MG tablet Take 4 mg by mouth at bedtime.        . folic acid (FOLVITE) 1 MG tablet Take 1 mg by mouth daily.        . furosemide (LASIX) 40 MG tablet Take 40 mg by mouth daily.        . interferon beta-1b (BETASERON) 0.3 MG injection Inject 0.3 mg into the skin every other day.        . metoprolol (LOPRESSOR) 100 MG tablet Take 100 mg by mouth 2 (two) times daily.        . QUEtiapine (SEROQUEL) 25 MG tablet Take 25 mg by mouth at bedtime. prn       . rosuvastatin (CRESTOR) 5 MG tablet Take 5 mg by mouth daily.        .  sildenafil (VIAGRA) 100 MG tablet Take 100 mg by mouth daily as needed.        . valsartan-hydrochlorothiazide (DIOVAN-HCT) 320-12.5 MG per tablet Take 1 tablet by mouth daily.        Marland Kitchen docusate sodium (COLACE) 100 MG capsule Take 100 mg by mouth daily.          REVIEW OF SYSTEMS:  A comprehensive review of systems was negative.   PHYSICAL EXAMINATION: General appearance: alert, cooperative and no distress Neck: no adenopathy Lymph nodes: Cervical, supraclavicular, and axillary nodes normal. Resp: clear to auscultation bilaterally Cardio: regular rate and rhythm, S1, S2 normal, no murmur, click, rub or gallop GI: soft, non-tender; bowel sounds normal; no masses,  no organomegaly Extremities: extremities normal, atraumatic, no cyanosis or edema  ECOG PERFORMANCE STATUS: 2 - Symptomatic, <50% confined to bed  Blood pressure 121/53, pulse 75, temperature 97.5 F (36.4 C), temperature source Oral, height 5\' 8"  (1.727 m), weight 182 lb 14.4 oz (82.963 kg).  LABORATORY DATA: Lab Results  Component Value Date   WBC 4.6 02/10/2012   HGB 12.1* 02/10/2012   HCT 37.5* 02/10/2012   MCV 84.0 02/10/2012   PLT 63* 02/10/2012  Chemistry      Component Value Date/Time   NA 140 08/10/2011 1508   K 4.1 08/10/2011 1508   CL 105 08/10/2011 1508   CO2 27 08/10/2011 1508   BUN 23 08/10/2011 1508   CREATININE 1.07 08/10/2011 1508      Component Value Date/Time   CALCIUM 10.0 08/10/2011 1508   ALKPHOS 296* 08/10/2011 1508   AST 24 08/10/2011 1508   ALT 22 08/10/2011 1508   BILITOT 0.4 08/10/2011 1508       RADIOGRAPHIC STUDIES: No results found.  ASSESSMENT: This is a very pleasant 66 years old white male with idiopathic thrombocytopenic purpura currently on observation. The patient is doing fine and his platelets count today are low at 63,000 but he is asymptomatic with no significant bleeding, bruises or ecchymosis. The decline in his platelets count could be also secondary to adverse  effect from his current medication especially  Crestor and Betaseron.  PLAN: I discussed the lab result with the patient and recommended for him continuous observation for now since he is currently asymptomatic. I indicated to the patient that I would consider him for treatment if his platelets count are less than 50,000 or if he started having bleeding issues. The patient agreed to the current plan. I would see him back for followup visit in 6 months but he was advised to call me immediately if he has any bleeding issues.  All questions were answered. The patient knows to call the clinic with any problems, questions or concerns. We can certainly see the patient much sooner if necessary.

## 2012-03-07 ENCOUNTER — Other Ambulatory Visit: Payer: Self-pay | Admitting: Thoracic Surgery (Cardiothoracic Vascular Surgery)

## 2012-03-07 DIAGNOSIS — I7101 Dissection of thoracic aorta: Secondary | ICD-10-CM

## 2012-04-01 ENCOUNTER — Ambulatory Visit
Admission: RE | Admit: 2012-04-01 | Discharge: 2012-04-01 | Disposition: A | Discharge status: Home / Self Care | Payer: BC Managed Care – PPO | Source: Ambulatory Visit | Attending: Thoracic Surgery (Cardiothoracic Vascular Surgery) | Admitting: Thoracic Surgery (Cardiothoracic Vascular Surgery)

## 2012-04-01 DIAGNOSIS — I7101 Dissection of thoracic aorta: Secondary | ICD-10-CM

## 2012-04-01 MED ORDER — IOHEXOL 350 MG/ML SOLN
100.0000 mL | Freq: Once | INTRAVENOUS | Status: AC | PRN
  Administered 2012-04-01: 100 mL via INTRAVENOUS

## 2012-04-04 ENCOUNTER — Ambulatory Visit (INDEPENDENT_AMBULATORY_CARE_PROVIDER_SITE_OTHER): Payer: BC Managed Care – PPO | Admitting: Thoracic Surgery (Cardiothoracic Vascular Surgery)

## 2012-04-04 ENCOUNTER — Encounter: Payer: Self-pay | Admitting: Thoracic Surgery (Cardiothoracic Vascular Surgery)

## 2012-04-04 VITALS — BP 135/74 | HR 78 | Resp 18 | Ht 68.0 in | Wt 182.0 lb

## 2012-04-04 DIAGNOSIS — I7100 Dissection of unspecified site of aorta: Secondary | ICD-10-CM

## 2012-04-04 DIAGNOSIS — I7122 Aneurysm of the aortic arch, without rupture: Secondary | ICD-10-CM

## 2012-04-04 DIAGNOSIS — I712 Thoracic aortic aneurysm, without rupture: Secondary | ICD-10-CM

## 2012-04-04 HISTORY — DX: Aneurysm of the aortic arch, without rupture: I71.22

## 2012-04-04 HISTORY — DX: Thoracic aortic aneurysm, without rupture: I71.2

## 2012-04-04 NOTE — Progress Notes (Signed)
[address]            [place] [address]          [phone]     CARDIOTHORACIC SURGERY OFFICE NOTE   PCP is GREEN, [name], MD   HPI:  Patient returns for follow up after originally presenting with an acute type A aortic dissection  in February of 2000 for which he underwent emergency replacement of his ascending thoracic aorta with resuspension of the native aortic valve. We have been following him ever since then with serial CT angiograms of his aorta.  He has had stable aneurysmal enlargement of the transverse aortic arch.  He was last seen here approximately 2 years ago. Following this he spent a year living in [place]. 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 His primary limitation set been related to his chronic battle with multiple sclerosis. He reports that over the past year he has been started on a new medication which has improved his symptoms somewhat. Overall he has otherwise had no new complaints. He specifically denies any pain in his chest or back which might be related to his chronic aortic dissection.   Current Outpatient Prescriptions  Medication Sig Dispense Refill  . amLODipine (NORVASC) 5 MG tablet Take 5 mg by mouth daily.        . Dalfampridine (AMPYRA PO) Take 2 tablets by mouth daily. Pt unsure of dose      . DIOVAN 320 MG tablet Take 320 mg by mouth daily.       Marland Kitchen doxazosin (CARDURA) 4 MG tablet Take 4 mg by mouth at bedtime.        . folic acid (FOLVITE) 1 MG tablet Take 1 mg by mouth daily.        . furosemide (LASIX) 40 MG tablet Take 40 mg by mouth daily.        . interferon beta-1b (BETASERON) 0.3 MG injection Inject 0.3 mg into the skin every other day.        . metoprolol (LOPRESSOR) 100 MG tablet Take 100 mg by mouth 2 (two) times daily.        . QUEtiapine (SEROQUEL) 25 MG tablet Take 25 mg by mouth at bedtime. prn       . rosuvastatin (CRESTOR) 5 MG tablet Take 5 mg by mouth  daily.            Physical Exam:   BP 135/74  Pulse 78  Resp 18  Ht 5\' 8"  (1.727 m)  Wt 82.555 kg (182 lb)  BMI 27.67 kg/m2  SpO2 99%  General:  Patient looks great overall  Chest:   Clear and symmetrical  CV:   RRR no murmur  Incisions:  Healed  Abdomen:  soft  Extremities:  Warm, pulses diminished  Diagnostic Tests:   *RADIOLOGY REPORT*  Clinical Data: Thoracic aortic dissection, postsurgical repair.  CT ANGIOGRAPHY CHEST, ABDOMEN AND PELVIS  Technique: Multidetector CT imaging through the chest, abdomen and  pelvis was performed using the standard protocol during bolus  administration of intravenous contrast. Multiplanar reconstructed  images including MIPs were obtained and reviewed to evaluate the  vascular anatomy.  Contrast: 148mL OMNIPAQUE IOHEXOL 350 MG/ML SOLN,  Comparison: 11/15/2009 and earlier studies  CTA CHEST  Findings: Stable tube graft in the proximal ascending aorta.  Persistent aneurysmal dilatation of  the distal ascending aorta  extending through the arch and proximal descending, maximum  diameter 5.4 cm at the level of the azygos arch. A dissection flap  extends from just above the surgical margin through the arch and  descending thoracic aorta. Dissection extends into the right  brachiocephalic artery to the origin of the proximal right common  carotid as before. There is decreased enhancement of the false  lumen likely related to slow flow dynamics.  Visualized pulmonary arterial circulation unremarkable; exam was  not optimized for detection of pulmonary emboli.  There is a new small right pleural effusion, with some adjacent  atelectasis in the posterior basal segment right lower lobe. No  pericardial effusion. No hilar or mediastinal adenopathy. Lungs  otherwise clear. Usual spurring in the lower thoracic spine.  Review of the MIP images confirms the above findings.  IMPRESSION:  1. Stable ascending aortic aneurysm above the surgical graft.   2. Stable residual aortic dissection flap without acute or  complicating features.  3. New small right pleural effusion.  CTA ABDOMEN AND PELVIS  Arterial findings:  Aorta: Dissection flap extends through the length  of the abdominal aorta, both the true and false lumens patent.  There is scattered atheromatous plaque in the infrarenal segment  which is mildly ectatic up to 2.8 cm diameter just above the  bifurcation.  Celiac axis: Patent, supplied by the true lumen  Superior mesenteric:Patent, supplied by the true lumen, with  classic distal branching anatomy.  Left renal: Single, supplied by the true lumen, with  partially calcified ostial plaque over a short segment less than 1  cm, resulting in at least mild stenosis. Widely patent distally.  Right renal: Single, with extension of the dissection  flap to the origin, patent distally.  Inferior mesenteric:Patent, supplied by the true lumen, with ostial  stenosis related to aortic wall plaque.  Left iliac: Patent, supplied by the true lumen.  Scattered calcified plaque through the common iliac without  stenosis. External iliac unremarkable. There is mild eccentric  nonocclusive plaque in the common femoral artery. Visualized  portions of proximal SFA and profunda femoris unremarkable.  Right iliac: Dissection extends through the common and  external iliac arteries. True lumen supplies the internal iliac.  The true lumen throughout is relatively compressed by the false  lumen. Dissection appears to terminate in the common femoral  artery. Visualized portions of the SFA and profunda femoris  branches unremarkable, with limited opacification.  Venous findings: Patent hepatic veins, portal vein, superior  mesenteric vein, splenic vein, bilateral renal veins, and IVC.  Review of the MIP images confirms the above findings.  Nonvascular findings: Stable lobular cystic lesion in the lateral  left hepatic segment. Probable sub centimeter  cyst in the  posterior right hepatic lobe image 137/5. Stable bilateral renal  cysts. No hydronephrosis. Sub centimeter partially calcified  stones scattered throughout the nondilated gallbladder.  Unremarkable spleen, adrenal glands, pancreas. No hydronephrosis.  Stomach, small bowel, and colon are nondistended, unremarkable.  Urinary bladder incompletely distended. Mild prostatic enlargement  with some central calcifications. No ascites. No free air. No  adenopathy localized. Usual anterior spurring in the lumbar spine.  IMPRESSION:  1. Dissection extends through the abdominal aorta through the  length of the right external iliac artery.  2. Left renal artery ostial stenosis of at least mild severity.  3. Cholelithiasis.  Original Report Authenticated By: Trecia Rogers, M.D.         Impression:  The patient  has stable aneurysmal enlargement of the transverse aortic arch in the setting of chronic aortic dissection, having originally undergone emergency replacement of the ascending thoracic aorta with resuspension of the native aortic valve at the time of his original presentation more than 13 years ago.  Plan:  We will continue to have the patient return for followup CT angiogram every 2 years for surveillance. All his questions have been addressed.   Valentina Gu. Roxy Manns, MD 04/04/2012 3:34 PM

## 2012-08-15 ENCOUNTER — Other Ambulatory Visit: Payer: BC Managed Care – PPO | Admitting: Lab

## 2012-08-15 ENCOUNTER — Ambulatory Visit: Payer: BC Managed Care – PPO | Admitting: Internal Medicine

## 2012-11-09 ENCOUNTER — Other Ambulatory Visit: Payer: Self-pay | Admitting: Neurology

## 2012-11-09 DIAGNOSIS — M839 Adult osteomalacia, unspecified: Secondary | ICD-10-CM

## 2012-11-24 ENCOUNTER — Ambulatory Visit
Admission: RE | Admit: 2012-11-24 | Discharge: 2012-11-24 | Disposition: A | Discharge status: Home / Self Care | Payer: BC Managed Care – PPO | Source: Ambulatory Visit | Attending: Neurology | Admitting: Neurology

## 2012-11-24 DIAGNOSIS — M839 Adult osteomalacia, unspecified: Secondary | ICD-10-CM

## 2012-12-15 ENCOUNTER — Telehealth: Payer: Self-pay

## 2012-12-15 NOTE — Telephone Encounter (Signed)
Called to sched f/u appt in May. No answer, left vmail.

## 2013-01-03 ENCOUNTER — Other Ambulatory Visit: Payer: Self-pay | Admitting: Internal Medicine

## 2013-01-03 DIAGNOSIS — R609 Edema, unspecified: Secondary | ICD-10-CM

## 2013-01-06 ENCOUNTER — Ambulatory Visit
Admission: RE | Admit: 2013-01-06 | Discharge: 2013-01-06 | Disposition: A | Discharge status: Home / Self Care | Payer: BC Managed Care – PPO | Source: Ambulatory Visit | Attending: Internal Medicine | Admitting: Internal Medicine

## 2013-01-06 DIAGNOSIS — R609 Edema, unspecified: Secondary | ICD-10-CM

## 2013-01-06 MED ORDER — IOHEXOL 300 MG/ML  SOLN
100.0000 mL | Freq: Once | INTRAMUSCULAR | Status: AC | PRN
  Administered 2013-01-06: 100 mL via INTRAVENOUS

## 2013-01-11 ENCOUNTER — Other Ambulatory Visit (HOSPITAL_COMMUNITY): Payer: Self-pay | Admitting: Internal Medicine

## 2013-01-11 DIAGNOSIS — R609 Edema, unspecified: Secondary | ICD-10-CM

## 2013-01-13 ENCOUNTER — Encounter: Payer: Self-pay | Admitting: Diagnostic Neuroimaging

## 2013-01-13 ENCOUNTER — Ambulatory Visit (INDEPENDENT_AMBULATORY_CARE_PROVIDER_SITE_OTHER): Payer: Federal, State, Local not specified - PPO | Admitting: Diagnostic Neuroimaging

## 2013-01-13 VITALS — BP 129/72 | HR 71 | Temp 97.9°F | Ht 68.0 in | Wt 191.0 lb

## 2013-01-13 DIAGNOSIS — G35 Multiple sclerosis: Secondary | ICD-10-CM | POA: Insufficient documentation

## 2013-01-13 NOTE — Patient Instructions (Addendum)
We will switch you to tysabri.

## 2013-01-13 NOTE — Addendum Note (Signed)
Addended byAndrey Spearman on: 01/13/2013 02:36 PM   Modules accepted: Orders

## 2013-01-13 NOTE — Progress Notes (Signed)
[facility]  PATIENT: [name] DOB: [birth year]   HISTORICAL  CHIEF COMPLAINT:  Chief Complaint  Patient presents with  . Neurologic Problem    multiple sclerosis #7    HISTORY OF PRESENT ILLNESS:   UPDATE 01/13/13: 67 yo Caucasian gentleman who was previously Timothy. Tressia Henry patient comes in today for follow up of MS.  The patient is accompanied by his wife.  Pt. States he is doing worse since his last visit in Feb.  He is walking slower and having more difficulty standing from a seated position.  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  Timothy. Erling Henry had talked to him about changing MS medications and he thinks he wants to. Having more memory problems. Continuing on betaseron right now.  PRIOR HPI (Timothy. 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 with a history of multiple sclerosis diagnosed in September 1996.  He has been on Betaseron since 1996 and has right leg spasticity requiring him to use a cane in his left hand.He does not have any significant side effects from the Betaseron. Blood studies 02/10/2012 are normal CBC and CMP except for a low platelet count of 63K, Hgb 12.1 and alk Phos of 253. This is being followed by Timothy. Nyoka Henry and Timothy. Julien Henry. He has never had lymphopenia or neutropenia. There is no change in his symptoms.  He denies bowel or bladder incontinence, weakness, Lhermitte's sign, or double vision. He swims 3 times per week for 10 laps which is a little over a quarter of a mile, claims he is winded afterwards. He has not fallen, uses a  cane in his left hand.  MRI of the  brain and cervical spine with and without contrast enhancement 03/26/2010 showed multiple subcortical, periventricular, and  brainstem white matter hyperintensities without enhancing lesions present. There were T1 black holes and atrophy present. The cervical MRI showed a large central  disc protrusion at C5-6 and spinal cord hyperintensities at C4 and brain stem representing remote demyelinating plaques without enhancing lesions present.  Ampyra was started November 2012 with improvement in gait. He was pleased with the addition of the drug and his response. Pt denies injection site problems however he says he is tired of shots. He has been on [employer] since 1996 and we have talked about whether changing to an oral agent should be considered. He  had several falls with his right leg giving way on standing. He has right knee pain. He states his memory is worse. He denies numbness delusions or depression. 07/11/2012= (MMSE29/30. Clock drawing task4/4. Animal fluency test 17).  His wife has noted more problems walking which the patient is hesitant to  discuss. He does admit since calling me for a  work in appointment , that he believes he is having more difficulty walking. He uses a walker at home and is noticeably slower. He uses  the walker to get out of a bed that is low and out of a chair. He notices shortness of breath on swimming. His swallowing is okay except for occasional difficulty with liquids. He has right knee pain when walking. Betaseron seems to help his knee pain, walking, and his eyes  after an injection. He is hesitant to go off of the  medication for 3 weeks. He has increased spasms in his right foot and leg that are also moving to the left leg. He has swelling in his  legs treated with furosemide.  REVIEW OF SYSTEMS: Full 14 system review of systems performed and notable only for restless legs, swelling of legs, urination problems, incontinence and impotence.  ALLERGIES: No Known Allergies  HOME MEDICATIONS: Outpatient Prescriptions Prior to Visit  Medication Sig Dispense Refill  . amLODipine (NORVASC) 5 MG tablet Take 5 mg by mouth daily.        . Dalfampridine (AMPYRA PO) Take 2 tablets by mouth daily. Pt unsure of dose      . DIOVAN 320 MG tablet Take 320 mg by  mouth daily.       Marland Kitchen doxazosin (CARDURA) 4 MG tablet Take 4 mg by mouth at bedtime.        . folic acid (FOLVITE) 1 MG tablet Take 1 mg by mouth daily.        . furosemide (LASIX) 40 MG tablet Take 40 mg by mouth daily.        . interferon beta-1b (BETASERON) 0.3 MG injection Inject 0.3 mg into the skin every other day.        . metoprolol (LOPRESSOR) 100 MG tablet Take 100 mg by mouth 2 (two) times daily.        . QUEtiapine (SEROQUEL) 25 MG tablet Take 25 mg by mouth at bedtime. prn       . rosuvastatin (CRESTOR) 5 MG tablet Take 5 mg by mouth daily.         No facility-administered medications prior to visit.    PAST MEDICAL HISTORY: Past Medical History  Diagnosis Date  . Neuromuscular disorder   . Hypertension   . Anemia   . Depression   . Multiple sclerosis   . Thrombocytopenia   . Dyslipidemia   . Aortic dissection, thoracic   . BPH (benign prostatic hyperplasia)   . Hemorrhoids   . Aortic dissection 11/05/1998    S/P emergency repair of acute type A aortic dissection with resuspension of native aortic valve  . Aneurysm of aortic arch 04/04/2012    Chronic aneurysmal dilatation of aortic arch with chronic type A aortic dissection, s/p replacement of ascending thoracic aorta    PAST SURGICAL HISTORY: Past Surgical History  Procedure Laterality Date  . Internal, external hemorrhoidectomy, general anesthesia,prone position.  2009  . Open reduction and internal fixation of right distal radius fracture using hand innovations distal radius volar locking plate.  07/2005    [name]    FAMILY HISTORY: Family History  Problem Relation Age of Onset  . Cancer Father   . Cancer Brother     SOCIAL HISTORY:  History   Social History  . Marital Status: Married    Spouse Name: N/A    Number of Children: N/A  . Years of Education: N/A   Occupational History  . Not on file.   Social History Main Topics  . Smoking status: Never Smoker   . Smokeless tobacco: Not on  file  . Alcohol Use: Yes     Comment: occasionally  . Drug Use:   . Sexually Active:    Other Topics Concern  . Not on file   Social History Narrative  . No narrative on file     PHYSICAL EXAM  Filed Vitals:   01/13/13 1159  BP: 129/72  Pulse: 71  Temp: 97.9 F (36.6 C)  TempSrc: Oral  Height: 5\' 8"  (1.727 m)  Weight: 191 lb (86.637 kg)   Body mass index is 29.05 kg/(m^2).  GENERAL EXAM: Patient is in no distress  CARDIOVASCULAR: Regular rate and rhythm, no murmurs, no carotid bruits  NEUROLOGIC: MENTAL STATUS: awake, alert, language fluent, comprehension intact, naming intact CRANIAL NERVE: no papilledema on fundoscopic exam, pupils equal and reactive to light, visual fields full to confrontation, extraocular muscles SHOW SACCADIC BREAKDOWN OF SMOOTH PURSUIT; END GAZE NYSTAGMUS; PAST POINTING ON SACCADES. Facial sensation and WITH DECR RIGHT EYE CLOSURE WEAKNESS AND RIGHT LOWER FACIAL STRENGTH. Uvula midline, shoulder shrug symmetric, tongue midline. MOTOR: INCREASED TONE IN BUE AND BLE (RIGHT > LEFT). RIGHT LEG WEAKNESS (HF 3, KE 4, KF 3, DF 3).  SENSORY: DECR IN RLE TO ALL MODALITIES. COORDINATION: finger-nose-finger, fine finger movements normal REFLEXES: BUE 3 (R>L), RLE 3, LLE 2.  GAIT/STATION: DIFF STANDING FROM CHAIR. USES A CANE. SPASTIC GAIT. RIGHT CIRCUMDUCTION. UNSTEADY.   DIAGNOSTIC DATA (LABS, IMAGING, TESTING) - I reviewed patient records, labs, notes, testing and imaging myself where available.  Lab Results  Component Value Date   WBC 4.6 02/10/2012   HGB 12.1* 02/10/2012   HCT 37.5* 02/10/2012   MCV 84.0 02/10/2012   PLT 63* 02/10/2012      Component Value Date/Time   NA 142 02/10/2012 1110   K 3.7 02/10/2012 1110   CL 106 02/10/2012 1110   CO2 29 02/10/2012 1110   GLUCOSE 79 02/10/2012 1110   BUN 20 02/10/2012 1110   CREATININE 1.20 02/10/2012 1110   CALCIUM 8.9 02/10/2012 1110   PROT 6.4 02/10/2012 1110   ALBUMIN 4.0 02/10/2012 1110   AST 36  02/10/2012 1110   ALT 33 02/10/2012 1110   ALKPHOS 253* 02/10/2012 1110   BILITOT 0.9 02/10/2012 1110   GFRNONAA >60 02/15/2008 0454   GFRAA  Value: >60        The eGFR has been calculated using the MDRD equation. This calculation has not been validated in all clinical 02/15/2008 0454   No results found for this basename: CHOL,  HDL,  LDLCALC,  LDLDIRECT,  TRIG,  CHOLHDL   No results found for this basename: HGBA1C   Lab Results  Component Value Date   VITAMINB12 224 01/12/2008   Lab Results  Component Value Date   TSH 0.849 *Test methodology is 3rd generation TSH* 01/09/2008   11/16/12 MRI brain - There are multiple periventricular and subcortical and pontine chronic demyelinating plaques. There are several T1 black holes. Mild diffuse atrophy. No acute plaques. In comparison to MRI from 03/26/10, there is no significant change.  11/16/12 MRI cervical spine - Multiple chronic demyelinating plaques at C2-3, C3-4 and C5-6.  No acute plaques. At C5-6: Disc bulging with facet hypertrophy with moderate spinal stenosis, severe right and moderate left foraminal stenosis. At C3-4, C4-5: Disc bulging with mild spinal stenosis and biforaminal stenosis. In comparison  to MRI from 03/26/10 there is no significant change.   ASSESSMENT AND PLAN  67 y.o. year old male  has a past medical history of Neuromuscular disorder; Hypertension; Anemia; Depression; Multiple sclerosis; Thrombocytopenia; Dyslipidemia; Aortic dissection, thoracic; BPH (benign prostatic hyperplasia); Hemorrhoids; Aortic dissection (11/05/1998); and Aneurysm of aortic arch (04/04/2012). here with multiple sclerosis since 1996.   We discussed the possibility of changing medication. He may be in the secondary progressive phase of his disease. He is JC virus PCR negative. I will order JCV antibody test, then consider tysabri.  Penni Bombard, MD [id number], [id number] PM Certified in Neurology, Neurophysiology and Neuroimaging  [facility] [address], [place], [place] [address] [phone]

## 2013-01-17 ENCOUNTER — Ambulatory Visit (HOSPITAL_COMMUNITY)
Admission: RE | Admit: 2013-01-17 | Discharge: 2013-01-17 | Disposition: A | Discharge status: Home / Self Care | Payer: BC Managed Care – PPO | Source: Ambulatory Visit | Attending: Internal Medicine | Admitting: Internal Medicine

## 2013-01-17 ENCOUNTER — Telehealth: Payer: Self-pay | Admitting: Diagnostic Neuroimaging

## 2013-01-17 DIAGNOSIS — I509 Heart failure, unspecified: Secondary | ICD-10-CM | POA: Insufficient documentation

## 2013-01-17 DIAGNOSIS — R609 Edema, unspecified: Secondary | ICD-10-CM | POA: Insufficient documentation

## 2013-01-17 NOTE — Progress Notes (Signed)
  Echocardiogram 2D Echocardiogram has been performed.  Antwoin Lackey, [place] 01/17/2013, 2:50 PM

## 2013-01-17 NOTE — Telephone Encounter (Signed)
I called pt and relayed that lab work, JCV antibody test needed prior to starting tysabri.  He did not remember that information given about tysbari, although wife did.  He is to bring p/w back to office if decides wants to proceed.

## 2013-01-20 ENCOUNTER — Other Ambulatory Visit: Payer: Self-pay | Admitting: *Deleted

## 2013-01-28 ENCOUNTER — Encounter: Payer: Self-pay | Admitting: Cardiovascular Disease

## 2013-01-30 ENCOUNTER — Encounter: Payer: Self-pay | Admitting: Physician Assistant

## 2013-01-30 ENCOUNTER — Ambulatory Visit (INDEPENDENT_AMBULATORY_CARE_PROVIDER_SITE_OTHER): Payer: BC Managed Care – PPO | Admitting: Physician Assistant

## 2013-01-30 VITALS — BP 120/76 | HR 63 | Ht 68.0 in | Wt 185.9 lb

## 2013-01-30 DIAGNOSIS — I712 Thoracic aortic aneurysm, without rupture, unspecified: Secondary | ICD-10-CM

## 2013-01-30 DIAGNOSIS — I071 Rheumatic tricuspid insufficiency: Secondary | ICD-10-CM | POA: Insufficient documentation

## 2013-01-30 DIAGNOSIS — I079 Rheumatic tricuspid valve disease, unspecified: Secondary | ICD-10-CM

## 2013-01-30 DIAGNOSIS — I4819 Other persistent atrial fibrillation: Secondary | ICD-10-CM | POA: Insufficient documentation

## 2013-01-30 DIAGNOSIS — E8770 Fluid overload, unspecified: Secondary | ICD-10-CM | POA: Insufficient documentation

## 2013-01-30 DIAGNOSIS — E8779 Other fluid overload: Secondary | ICD-10-CM

## 2013-01-30 DIAGNOSIS — J90 Pleural effusion, not elsewhere classified: Secondary | ICD-10-CM

## 2013-01-30 DIAGNOSIS — I4891 Unspecified atrial fibrillation: Secondary | ICD-10-CM

## 2013-01-30 HISTORY — DX: Rheumatic tricuspid insufficiency: I07.1

## 2013-01-30 HISTORY — DX: Pleural effusion, not elsewhere classified: J90

## 2013-01-30 HISTORY — DX: Unspecified atrial fibrillation: I48.91

## 2013-01-30 MED ORDER — FUROSEMIDE 40 MG PO TABS: 40.0000 mg | ORAL_TABLET | Freq: Two times a day (BID) | ORAL | Status: DC

## 2013-01-30 MED ORDER — POTASSIUM CHLORIDE ER 10 MEQ PO TBCR: 20.0000 meq | EXTENDED_RELEASE_TABLET | Freq: Two times a day (BID) | ORAL | Status: DC

## 2013-01-30 NOTE — Assessment & Plan Note (Signed)
This is new-onset atrial fibrillation with exact onset date unknown. This may in fact be contributing to the patient's volume overload dyspnea.  Patient is clearly not an anticoagulation candidate given aortic history. Rate is well controlled. Will consider TEE cardioversion although 30 days post cardioversion anticoagulation is not recommended.

## 2013-01-30 NOTE — Assessment & Plan Note (Signed)
Increasing Lasix to 80 mg in the morning and 40 mg in the evening. The patient was asked to monitor his weight for the next 3 days. We will bring him in on Thursday to reevaluate. If he has no decrease in weight in the next 3 days, I will have low threshold for admitting for IV diuretics and close monitoring.

## 2013-01-30 NOTE — Patient Instructions (Signed)
Follow up as directed per Tarri Fuller P.A.

## 2013-01-30 NOTE — Assessment & Plan Note (Addendum)
We'll need to surgical evaluation by [name].   Status has stabilized we'll arrange for LexiScan Myoview for potential preop clearance.

## 2013-01-30 NOTE — Progress Notes (Signed)
Date:  01/30/2013   ID:  [name], DOB Jun 15, 1946, MRN [id number]  PCP:  Andrey Spearman, MD  Primary Cardiologist:  [name]     History of Present Illness: [name] is a 67 y.o. male history of hemispheric repair of type A aortic dissection with resuspension of the native aortic valve in February 2000 Dr. Ricard Dillon, chronic aortic dissection extends from the descending, multiple sclerosis  visualized thoracic aorta into the right common iliac artery, dyslipidemia, hypertension, anemia.  Patient's last 2-D echocardiogram was 01/17/2013 revealed an ejection fraction of 50-55%.. Wall motion was normal. Uric valve surgery with a mild central regurgitation. Aortic root was dilated ST junction. Mild to moderate mitral valve regurgitation. Left H. and was severely dilated 35.5 cm. The right ventricle cavity size mildly dilated. Right atrium was severely dilated at 32.5 cm tricuspid valve showed severe regurgitation with reversal of flow in the hepatic veins. Dilated tricuspid annulus measuring 6.16 cm. The pulmonary pressure was 44 mmHg. Patient's last nuclear stress test was in 2011 and showed no significant ischemia was considered low risk.  CT of the abdomen and pelvis 4/29.14 showed chronic aortic dissection that extends into the right common iliac artery.  Small amount of ascites. Large right pleural effusion. Diffuse subcutaneous edema. These findings may be on the basis of elevated right heart filling pressures supported by cardiomegaly.  The patient presents for evaluation.  He reports the first signs signs of dyspnea on exertion emotion or edema approximately 2 years ago but recently has gotten severely more traumatic. Reports increased lower extremity edema and dyspnea on exertion. He usually swims but as of one month ago he had to stop swimming because of increased shortness of breath and fatigue. He  denies orthopnea, PND, chest pain, nausea, vomiting, fever, palpitations. He does report  dizziness when stooping over on occasion.  EKG done in our office shows atrial fibrillation with controlled ventricular rate, 62 beats per minute.   Wt Readings from Last 3 Encounters:  01/30/13 185 lb 14.4 oz (84.324 kg)  01/13/13 191 lb (86.637 kg)  04/04/12 182 lb (82.555 kg)     Past Medical History  Diagnosis Date  . Neuromuscular disorder   . Hypertension   . Anemia   . Depression   . Multiple sclerosis   . Thrombocytopenia   . Dyslipidemia   . Aortic dissection, thoracic   . BPH (benign prostatic hyperplasia)   . Hemorrhoids   . Aortic dissection 11/05/1998    S/P emergency repair of acute type A aortic dissection with resuspension of native aortic valve  . 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 Chest pain 11/18/2009    2D Echo - EF >55%, right ventricle is moderately dilated, right atrium is severely dilated, moderate-severe tricuspid regurgitation; R/P Myoview - EF 56%, no scintigraphic evidence of inducible myocardial ischemia, EKG negative for ischemia,   . CHF (congestive heart failure) 06-16-1946    2D Echo - EF 50-55%, mild-moderately dilated right ventricle, mild-moderate tricuspid valve regurgitation, moderately dilated right atrium    Current Outpatient Prescriptions  Medication Sig Dispense Refill  . amLODipine (NORVASC) 5 MG tablet Take 5 mg by mouth daily.        . Dalfampridine (AMPYRA PO) Take 1 tablet by mouth 2 (two) times daily. Pt unsure of dose      . DIOVAN 320 MG tablet Take 320 mg  by mouth daily.       Marland Kitchen doxazosin (CARDURA) 4 MG tablet Take 4 mg by mouth at bedtime.        . folic acid (FOLVITE) 1 MG tablet Take 1 mg by mouth daily.        . interferon beta-1b (BETASERON) 0.3 MG injection Inject 0.3 mg into the skin every other day.        . metoprolol (LOPRESSOR) 100 MG tablet Take 100 mg by mouth 2 (two) times daily.         . QUEtiapine (SEROQUEL) 25 MG tablet Take 25 mg by mouth at bedtime. prn       . rosuvastatin (CRESTOR) 5 MG tablet Take 5 mg by mouth daily.        . furosemide (LASIX) 40 MG tablet Take 1-2 tablets (40-80 mg total) by mouth 2 (two) times daily.  90 tablet  3   No current facility-administered medications for this visit.    Allergies:   No Known Allergies  Social History:  The patient  reports that he has never smoked. He does not have any smokeless tobacco history on file. He reports that  drinks alcohol.   ROS:  Please see the history of present illness.  All other systems reviewed and negative.   PHYSICAL EXAM: VS:  BP 120/76  Pulse 63  Ht 5\' 8"  (1.727 m)  Wt 185 lb 14.4 oz (84.324 kg)  BMI 28.27 kg/m2 Well nourished, well developed, in no acute distress HEENT: Pupils are equal round react to light accommodation extraocular movements are intact.  Neck:  JVD up to his ear. Cardiac: Irregular rate and rhythm without murmur.. Lungs: His lung sounds at the right base otherwise clear to auscultation, no wheezing, rhonchi or rales Abd: Distended, nontender, positive bowel sounds all quadrants, no hepatosplenomegaly, dull to percussion circumferentially Ext: Tense 2+ lower extremity edema.  2+ radial and 1+ dorsalis pedis pulses. Skin: warm and dry Neuro:  Alert and oriented  CT ABDOMEN AND PELVIS WITH CONTRAST  Technique: Multidetector CT imaging of the abdomen and pelvis was performed following the standard protocol during bolus administration of intravenous contrast.  Contrast: 154mL OMNIPAQUE IOHEXOL 300 MG/ML SOLN  Comparison: 04/01/2012  Findings: A chronic aortic dissection extends from the descending visualized thoracic aorta into the right common iliac artery. The celiac axis, superior mesenteric artery and single renal arteries bilaterally all arises from the true lumen. There is atherosclerotic plaque at the origins of these vessels. The only significant  narrowing is of the origin of the left renal artery, estimated 70% stenosis.  An irregular, 4.5 cm, hypoattenuating area in the lateral segment of the left lobe of the liver, extending from the anterior surface, which is stable from the prior CT likely a benign post traumatic lesion or cyst. The liver is otherwise unremarkable. Normal spleen. There are gallstones and a mostly decompressed gallbladder. No bile duct dilation. Normal pancreas. No adrenal masses. Mild bilateral renal cortical thinning is noted. There are bilateral renal cysts. No solid masses or hydronephrosis. Normal ureters and bladder.  There is a small amount of ascites collecting primarily adjacent to the liver and spleen and tracking along the pericolic gutters into the pelvis. No pathologically enlarged lymph nodes.  There is mild increase stool the colon. The bowel is there is a mild to large right pleural effusion with associated dependent right lower lobe atelectasis. The heart is mildly enlarged.  There is diffuse subcutaneous edema in the lower abdomen and  proximal thighs.  Degenerative changes are noted of the visualized spine. No osteoblastic or osteolytic lesions. otherwise unremarkable. A normal appendix is not discretely seen, but there is no evidence of appendicitis.  IMPRESSION: Chronic aortic dissection that extends into the right common iliac artery.  Small amount of ascites. Large right pleural effusion. Diffuse subcutaneous edema. These findings may be on the basis of elevated right heart filling pressures supported by cardiomegaly.  No convincing acute findings in the abdomen pelvis. Note is made of a stable, irregular hypoattenuating lesion in the left lobe of the liver, gallstones and renal cysts as well as degenerative changes of the visualized spine.   EKG:  Atrial fibrillation with controlled ventricular rate.  ASSESSMENT AND PLAN:   Problem List Items Addressed This Visit    Aneurysm of aortic arch (Chronic)     Monitored by Dr. Ricard Dillon    Relevant Medications      furosemide (LASIX) tablet   Atrial fibrillation     This is new-onset atrial fibrillation with exact onset date unknown. This may in fact be contributing to the patient's volume overload dyspnea.  Patient is clearly not an anticoagulation candidate given aortic history. Rate is well controlled. Will consider TEE cardioversion although 30 days post cardioversion anticoagulation is not recommended.    Relevant Medications      furosemide (LASIX) tablet   Volume overload - Primary     Increasing Lasix to 80 mg in the morning and 40 mg in the evening. The patient was asked to monitor his weight for the next 3 days. We will bring him in on Thursday to reevaluate. If he has no decrease in weight in the next 3 days, I will have low threshold for admitting for IV diuretics and close monitoring.    Relevant Medications      furosemide (LASIX) tablet   Other Relevant Orders      EKG 12-Lead   Severe tricuspid valve regurgitation     We'll need to surgical evaluation by [name].   Status has stabilized we'll arrange for LexiScan Myoview for potential preop clearance.    Relevant Medications      furosemide (LASIX) tablet   Pleural effusion, right, large     Increase Lasix as stated below

## 2013-01-30 NOTE — Assessment & Plan Note (Signed)
Monitored by Dr. Ricard Dillon

## 2013-01-30 NOTE — Assessment & Plan Note (Signed)
Increase Lasix as stated below

## 2013-01-31 ENCOUNTER — Telehealth: Payer: Self-pay | Admitting: Diagnostic Neuroimaging

## 2013-01-31 NOTE — Telephone Encounter (Signed)
Pt calling in reference to proceeding w/ Tysabri.

## 2013-02-02 ENCOUNTER — Encounter: Payer: Self-pay | Admitting: Cardiology

## 2013-02-02 ENCOUNTER — Ambulatory Visit (INDEPENDENT_AMBULATORY_CARE_PROVIDER_SITE_OTHER): Payer: BC Managed Care – PPO | Admitting: Cardiology

## 2013-02-02 VITALS — BP 104/60 | HR 73 | Ht 68.0 in | Wt 184.4 lb

## 2013-02-02 DIAGNOSIS — I77810 Thoracic aortic ectasia: Secondary | ICD-10-CM

## 2013-02-02 DIAGNOSIS — D696 Thrombocytopenia, unspecified: Secondary | ICD-10-CM

## 2013-02-02 DIAGNOSIS — G35 Multiple sclerosis: Secondary | ICD-10-CM

## 2013-02-02 DIAGNOSIS — I4891 Unspecified atrial fibrillation: Secondary | ICD-10-CM

## 2013-02-02 DIAGNOSIS — R609 Edema, unspecified: Secondary | ICD-10-CM

## 2013-02-02 DIAGNOSIS — E8770 Fluid overload, unspecified: Secondary | ICD-10-CM

## 2013-02-02 DIAGNOSIS — I712 Thoracic aortic aneurysm, without rupture: Secondary | ICD-10-CM

## 2013-02-02 DIAGNOSIS — I071 Rheumatic tricuspid insufficiency: Secondary | ICD-10-CM

## 2013-02-02 DIAGNOSIS — J810 Acute pulmonary edema: Secondary | ICD-10-CM

## 2013-02-02 DIAGNOSIS — R0602 Shortness of breath: Secondary | ICD-10-CM

## 2013-02-02 DIAGNOSIS — E8779 Other fluid overload: Secondary | ICD-10-CM

## 2013-02-02 DIAGNOSIS — J90 Pleural effusion, not elsewhere classified: Secondary | ICD-10-CM

## 2013-02-02 DIAGNOSIS — I079 Rheumatic tricuspid valve disease, unspecified: Secondary | ICD-10-CM

## 2013-02-02 MED ORDER — POTASSIUM CHLORIDE ER 10 MEQ PO TBCR: 20.0000 meq | EXTENDED_RELEASE_TABLET | Freq: Two times a day (BID) | ORAL | Status: DC

## 2013-02-02 NOTE — Assessment & Plan Note (Signed)
Per CT of pelvis

## 2013-02-02 NOTE — Assessment & Plan Note (Signed)
On recent echo pt with dilated aortic root at the [place].  there appears to be an excluded lumen and possible graft material- pt will needCT aortogram for better visualization of the aortic root. Will plan once SOB edema improved and will check labs.

## 2013-02-02 NOTE — Progress Notes (Signed)
[place]  02/02/2013   PCP: Criselda Peaches, MD   Chief Complaint  Patient presents with  . office visit    fluid overload per bryan hager, PA    Primary Cardiologist: New for Dr. Lemmie Evens had seen Dr. Tina Griffiths and Dr. Melvern Banker.  HPI:  49 YOWMM here for follow up after beginning diuretics for volume overload due to severe TR and found to be in new Atrial fib though rate controlled on the 19th of May.  His Lasix was increased to 80 mg in AM and 40 in the pm.  He now states he is feeling better.  Weight is only down a couple of pounds.  Denies SOB currently, no chest pain.  History of hemispheric repair of type A aortic dissection with resuspension of the native aortic valve in February 2000 Dr. Ricard Dillon, chronic aortic dissection extends  into the right common iliac artery.  He has Multiple sclerosis, dyslipidemia, hypertension, anemia. And thrombocytopenia. Patient's last 2-D echocardiogram was 01/17/2013 revealed an ejection fraction of 50-55%.. Wall motion was normal. Aortic valve with a mild central regurgitation. Aortic root was dilated ST junction. Mild to moderate mitral valve regurgitation. Left atrium was severely dilated 35.5 cm. The right ventricle cavity size mildly dilated. Right atrium was severely dilated at 32.5 cm , tricuspid valve showed severe regurgitation with reversal of flow in the hepatic veins. Dilated tricuspid annulus measuring 6.16 cm. The pulmonary pressure was 44 mmHg. Patient's last nuclear stress test was in 2011 and showed no significant ischemia was considered low risk. CT of the abdomen and pelvis 4/29.14 showed chronic aortic dissection that extends into the right common iliac artery. Small amount of ascites. Large right pleural effusion.    Discussed the Patient with Dr. [name].  No Known Allergies  Current Outpatient Prescriptions  Medication Sig Dispense Refill  . amLODipine (NORVASC) 5 MG tablet Take 5 mg by mouth  daily.        . Dalfampridine (AMPYRA PO) Take 1 tablet by mouth 2 (two) times daily. Pt unsure of dose      . DIOVAN 320 MG tablet Take 320 mg by mouth daily.       Marland Kitchen doxazosin (CARDURA) 4 MG tablet Take 4 mg by mouth at bedtime.        . folic acid (FOLVITE) 1 MG tablet Take 1 mg by mouth daily.        . furosemide (LASIX) 40 MG tablet Take 40-80 mg by mouth 2 (two) times daily. Take 2 tablet in the morning and 1 tablet at night      . interferon beta-1b (BETASERON) 0.3 MG injection Inject 0.3 mg into the skin every other day.        . metoprolol (LOPRESSOR) 100 MG tablet Take 100 mg by mouth 2 (two) times daily.        . QUEtiapine (SEROQUEL) 25 MG tablet Take 25 mg by mouth at bedtime. prn       . rosuvastatin (CRESTOR) 5 MG tablet Take 5 mg by mouth daily.        . potassium chloride (K-DUR) 10 MEQ tablet Take 2 tablets (20 mEq total) by mouth 2 (two) times daily.  120 tablet  6   No current facility-administered medications for this visit.    Past Medical History  Diagnosis Date  . Neuromuscular disorder   . Hypertension   . Anemia   . Depression   . Multiple sclerosis   . Thrombocytopenia   .  Dyslipidemia   . Aortic dissection, thoracic   . BPH (benign prostatic hyperplasia)   . Hemorrhoids   . Aortic dissection 11/05/1998    S/P emergency repair of acute type A aortic dissection with resuspension of native aortic valve  . 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 Chest pain 11/18/2009    2D Echo - EF >55%, right ventricle is moderately dilated, right atrium is severely dilated, moderate-severe tricuspid regurgitation; R/P Myoview - EF 56%, no scintigraphic evidence of inducible myocardial ischemia, EKG negative for ischemia,   . CHF (congestive heart failure) Jun 21, 1946    2D Echo - EF 50-55%, mild-moderately dilated right ventricle, mild-moderate  tricuspid valve regurgitation, moderately dilated right atrium    Past Surgical History  Procedure Laterality Date  . Internal, external hemorrhoidectomy, general anesthesia,prone position.  2009  . Open reduction and internal fixation of right distal radius fracture using hand innovations distal radius volar locking plate.  07/2005    Dr Ninfa Linden    [id number] colds or fevers, decrease in weight Skin:no rashes or ulcers HEENT:no blurred vision, no congestion CV:see HPI PUL:see HPI GI:no diarrhea constipation or melena, no indigestion GU:no hematuria, no dysuria [id number] weakness, difficulty walking,  Tonic clonic movements with touch  Neuro:no syncope, no lightheadedness Endo:no diabetes, no thyroid disease  PHYSICAL EXAM BP 104/60  Pulse 73  Ht 5\' 8"  (1.727 m)  Wt 184 lb 6.4 oz (83.643 kg)  BMI 28.04 kg/m2 General:Pleasant affect, though initially flat affect NAD,  Skin:Warm and dry, brisk capillary refill, some bruising HEENT:normocephalic, sclera clear, mucus membranes moist Neck:supple, + JVD to jaw line, no bruits  Heart:irreg irreg without murmur, gallup, rub or click Lungs:clear without rales, rhonchi, or wheezes [id number], non tender, + BS, do not palpate liver spleen or masses Ext:1+ lower ext edema-improved, 1+ pedal pulses, 2+ radial pulses Neuro:alert and oriented, MAE, follows commands, + facial symmetry  [id number] fib rate controlled with HR of 69-incomplete RBBB  ASSESSMENT AND PLAN Volume overload Overload secondary to severe TR and now A fib. Pt does feel better with mild weight loss.  Will increase lasix to 80 mg BID, will also make sure he receives his K+.  Check labs, BMP, Pro BNP, CBC--Also discussed with Dr. [name].  We will have pt back to see Dr. [name] on Wed. Of next week.   Atrial fibrillation New, though unsure how long pt has been in a. Fib.  Rate is controlled.  Cannot DCCV because Dr. [name] does not feel it is safe to use  anticoagulant.     Severe tricuspid valve regurgitation Needs surgery, Dr. Zada Girt did talk with [name] but [name] wants cardiac eval first.  Once pt is less overloaded, will prob. Need Rt and Lt cardiac cath- Dr. [name] to decide on next visit.    Aneurysm of aortic arch On recent echo pt with dilated aortic root at the [place].  there appears to be an excluded lumen and possible graft material- pt will needCT aortogram for better visualization of the aortic root. Will plan once SOB edema improved and will check labs.     MS (multiple sclerosis) Chronic and stable  Thrombocytopenia Followed by [name].  Pleural effusion, right, large Per CT of pelvis

## 2013-02-02 NOTE — Assessment & Plan Note (Signed)
Needs surgery, Dr. Zada Girt did talk with [name] but [name] wants cardiac eval first.  Once pt is less overloaded, will prob. Need Rt and Lt cardiac cath- Dr. [name] to decide on next visit.

## 2013-02-02 NOTE — Assessment & Plan Note (Signed)
Followed by Dr Mohamed 

## 2013-02-02 NOTE — Assessment & Plan Note (Signed)
New, though unsure how long pt has been in a. Fib.  Rate is controlled.  Cannot DCCV because Dr. [name] does not feel it is safe to use anticoagulant.

## 2013-02-02 NOTE — Assessment & Plan Note (Signed)
Chronic and stable.   

## 2013-02-02 NOTE — Assessment & Plan Note (Addendum)
Overload secondary to severe TR and now A fib. Pt does feel better with mild weight loss.  Will increase lasix to 80 mg BID, will also make sure he receives his K+.  Check labs, BMP, Pro BNP, CBC--Also discussed with Dr. [name].  We will have pt back to see Dr. [name] on Wed. Of next week.

## 2013-02-02 NOTE — Patient Instructions (Addendum)
Labwork BMP, CBC,Troponin, ProBNP.  [facility] the [place] [address]  Increase Furosemide to 80mg  twice a day  Need to see [name] for evaluation of Tricuspid Valve

## 2013-02-02 NOTE — Telephone Encounter (Signed)
[facility] JCV test result came back positive.  [name] faxed report placed in his in box.

## 2013-02-03 ENCOUNTER — Other Ambulatory Visit: Payer: Self-pay | Admitting: *Deleted

## 2013-02-03 ENCOUNTER — Telehealth: Payer: Self-pay | Admitting: Diagnostic Neuroimaging

## 2013-02-03 DIAGNOSIS — I071 Rheumatic tricuspid insufficiency: Secondary | ICD-10-CM

## 2013-02-03 LAB — CBC
HCT: 30.3 % — ABNORMAL LOW (ref 39.0–52.0)
Hemoglobin: 9.9 g/dL — ABNORMAL LOW (ref 13.0–17.0)
MCH: 25.9 pg — ABNORMAL LOW (ref 26.0–34.0)
MCHC: 32.7 g/dL (ref 30.0–36.0)
MCV: 79.3 fL (ref 78.0–100.0)
Platelets: 74 10*3/uL — ABNORMAL LOW (ref 150–400)
RBC: 3.82 MIL/uL — ABNORMAL LOW (ref 4.22–5.81)
RDW: 16 % — ABNORMAL HIGH (ref 11.5–15.5)
WBC: 3.6 10*3/uL — ABNORMAL LOW (ref 4.0–10.5)

## 2013-02-03 LAB — BASIC METABOLIC PANEL
BUN: 27 mg/dL — ABNORMAL HIGH (ref 6–23)
CO2: 28 mEq/L (ref 19–32)
Calcium: 9.3 mg/dL (ref 8.4–10.5)
Chloride: 105 mEq/L (ref 96–112)
Creat: 1.51 mg/dL — ABNORMAL HIGH (ref 0.50–1.35)
Glucose, Bld: 102 mg/dL — ABNORMAL HIGH (ref 70–99)
Potassium: 3.6 mEq/L (ref 3.5–5.3)
Sodium: 141 mEq/L (ref 135–145)

## 2013-02-03 LAB — TROPONIN I: Troponin I: 0.01 ng/mL (ref <0.06)

## 2013-02-03 LAB — PRO B NATRIURETIC PEPTIDE: Pro B Natriuretic peptide (BNP): 2576 pg/mL — ABNORMAL HIGH (ref <126)

## 2013-02-03 NOTE — Telephone Encounter (Signed)
I called the patient and discussed test results. 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 We still could consider Tysabri, but there would be high risk if he takes this medication more than 2 years. Alternate possibilities include Copaxone versus Tecfidera. Patient also has newly diagnosed atrial fibrillation as well as mitral regurgitation. He is undergoing cardiac evaluation and possible surgical evaluation as well.  For now we'll continue Betaseron. If surgery is anticipated in the short-term, we will hold off on making change and after surgery. If surgery is not anticipated, then we can go ahead with making a change in his multiple sclerosis disease modifying therapy.  Penni Bombard, MD [id number], [id number] PM Certified in Neurology, Neurophysiology and Neuroimaging  [facility] [address], [place], [place] [address] [phone]

## 2013-02-04 ENCOUNTER — Telehealth: Payer: Self-pay | Admitting: Cardiology

## 2013-02-04 NOTE — Telephone Encounter (Signed)
Called pt to review labs with him.  He is feeling better with higher dose of lasix.  I explained we would need a CT aortogram of his chest to help determine status of aorta.  He is agreeable and will be arranged through our office.  Instructed him to call if he became lightheaded or dizzy with the diuretic.  He is to see Dr. [name] on Wed.

## 2013-02-04 NOTE — Addendum Note (Signed)
Addended by: Isaiah Serge on: 02/04/2013 04:19 PM   Modules accepted: Orders

## 2013-02-08 ENCOUNTER — Ambulatory Visit (INDEPENDENT_AMBULATORY_CARE_PROVIDER_SITE_OTHER): Payer: BC Managed Care – PPO | Admitting: Cardiovascular Disease

## 2013-02-08 ENCOUNTER — Encounter: Payer: Self-pay | Admitting: Cardiovascular Disease

## 2013-02-08 VITALS — BP 120/68 | HR 64 | Ht 68.0 in | Wt 172.0 lb

## 2013-02-08 DIAGNOSIS — I071 Rheumatic tricuspid insufficiency: Secondary | ICD-10-CM

## 2013-02-08 DIAGNOSIS — D689 Coagulation defect, unspecified: Secondary | ICD-10-CM

## 2013-02-08 DIAGNOSIS — I4891 Unspecified atrial fibrillation: Secondary | ICD-10-CM

## 2013-02-08 DIAGNOSIS — Z79899 Other long term (current) drug therapy: Secondary | ICD-10-CM

## 2013-02-08 DIAGNOSIS — I079 Rheumatic tricuspid valve disease, unspecified: Secondary | ICD-10-CM

## 2013-02-08 NOTE — Assessment & Plan Note (Signed)
New since his prior EKG in our chart from 2011. I believe this is responsible for his lower extremity edema and dyspnea on exertion and accommodation with his severe TR. I suspect that he will require Coumadin anticoagulation for stroke prophylaxis given his elevated Chad2  Score. I will discuss this with [name]

## 2013-02-08 NOTE — Addendum Note (Signed)
Addended byChauncy Lean. on: 02/08/2013 05:31 PM   Modules accepted: Orders

## 2013-02-08 NOTE — Assessment & Plan Note (Addendum)
I believe the patient's symptoms are related to a combination of his severe TR related to a dilated tricuspid annulus on top of recent A. Fib. While his symptoms are improving with diuretics I believe a surgical solution will ultimately be necessary, i.e. Tricuspid annuloplasty plus or minus surgical Maze procedure.I'm going to refer the patient back to Dr. Lilly Cove  for further evaluation.after discussion with Dr. Lilly Cove today on the phone he requested that a transesophageal echocardiogram be performed to further evaluate his valvular anatomy prior to that office visit which I have arranged to be performed by Dr. Sallyanne Kuster.

## 2013-02-08 NOTE — Progress Notes (Addendum)
02/08/2013 [name]   06-Jan-1946  [id number]  Primary Physician GREEN, [name], MD Primary Cardiologist: [name] MD [place], [place]   HPI:  [name] is a 67 y.o. male history of hemispheric repair of type A aortic dissection with resuspension of the native aortic valve in February 2000 Dr. Ricard Dillon, chronic aortic dissection extends from the descending, multiple sclerosis  visualized thoracic aorta into the right common iliac artery, dyslipidemia, hypertension, anemia. Patient's last 2-D echocardiogram was 01/17/2013 revealed an ejection fraction of 50-55%.. Wall motion was normal. Uric valve surgery with a mild central regurgitation. Aortic root was dilated ST junction. Mild to moderate mitral valve regurgitation. Left H. and was severely dilated 35.5 cm. The right ventricle cavity size mildly dilated. Right atrium was severely dilated at 32.5 cm tricuspid valve showed severe regurgitation with reversal of flow in the hepatic veins. Dilated tricuspid annulus measuring 6.16 cm. The pulmonary pressure was 44 mmHg. Patient's last nuclear stress test was in 2011 and showed no significant ischemia was considered low risk. CT of the abdomen and pelvis 4/29.14 showed chronic aortic dissection that extends into the right common iliac artery. Small amount of ascites. Large right pleural effusion. Diffuse subcutaneous edema. These findings may be on the basis of elevated right heart filling pressures supported by cardiomegaly. The patient presents for evaluation. He reports the first signs signs of dyspnea on exertion emotion or edema approximately 2 years ago but recently has gotten severely more traumatic. Reports increased lower extremity edema and dyspnea on exertion. He usually swims but as of one month ago he had to stop swimming because of increased shortness of breath and fatigue. He denies orthopnea, PND, chest pain, nausea, vomiting, fever, palpitations. He does report  dizziness when stooping over on occasion. EKG done in our office shows atrial fibrillation with controlled ventricular rate, 62 beats per minute. [name] has adjusted his diuretics which has resulted in moderate improvement in his lower gemmae edema. He remains in atrial fibrillation with a controlled ventricular response. I believe his dyspnea and edema are related to a combination of his severe TR along with his A. Fib. He is a candidate for Coumadin anticoagulation for stroke prophylaxis.    Current Outpatient Prescriptions  Medication Sig Dispense Refill  . amLODipine (NORVASC) 5 MG tablet Take 5 mg by mouth daily.        . Dalfampridine (AMPYRA PO) Take 1 tablet by mouth 2 (two) times daily. Pt unsure of dose      . DIOVAN 320 MG tablet Take 320 mg by mouth daily.       Marland Kitchen doxazosin (CARDURA) 4 MG tablet Take 4 mg by mouth at bedtime.        . folic acid (FOLVITE) 1 MG tablet Take 1 mg by mouth daily.        . furosemide (LASIX) 40 MG tablet Take 40-80 mg by mouth 2 (two) times daily. Take 2 tablet in the morning and 1 tablet at night      . interferon beta-1b (BETASERON) 0.3 MG injection Inject 0.3 mg into the skin every other day.        . metoprolol (LOPRESSOR) 100 MG tablet Take 100 mg by mouth 2 (two) times daily.        . potassium chloride (K-DUR) 10 MEQ tablet Take 2 tablets (20 mEq total) by mouth 2 (two) times daily.  120 tablet  6  . QUEtiapine (SEROQUEL) 25 MG tablet Take 25 mg by  mouth at bedtime. prn       . rosuvastatin (CRESTOR) 5 MG tablet Take 5 mg by mouth daily.         No current facility-administered medications for this visit.    No Known Allergies  History   Social History  . Marital Status: Married    Spouse Name: N/A    Number of Children: N/A  . Years of Education: N/A   Occupational History  . Not on file.   Social History Main Topics  . Smoking status: Never Smoker   . Smokeless tobacco: Not on file  . Alcohol Use: Yes     Comment: occasionally   . Drug Use: Not on file  . Sexually Active: Not on file   Other Topics Concern  . Not on file   Social History Narrative  . No narrative on file     Review of Systems: General: negative for chills, fever, night sweats or weight changes.  Cardiovascular: negative for chest pain, dyspnea on exertion, edema, orthopnea, palpitations, paroxysmal nocturnal dyspnea or shortness of breath Dermatological: negative for rash Respiratory: negative for cough or wheezing Urologic: negative for hematuria Abdominal: negative for nausea, vomiting, diarrhea, bright red blood per rectum, melena, or hematemesis Neurologic: negative for visual changes, syncope, or dizziness All other systems reviewed and are otherwise negative except as noted above.    Blood pressure 120/68, pulse 64, height 5\' 8"  (1.727 m), weight 172 lb (78.019 kg).  General appearance: alert and no distress Neck: no adenopathy, no carotid bruit, no JVD, supple, symmetrical, trachea midline, thyroid not enlarged, symmetric, no tenderness/mass/nodules and cannon A waves Lungs: clear to auscultation bilaterally Heart: irregularly irregular rhythm Abdomen: pulsatile enlarged liver Extremities: 1-2+ pitting lower extremity edema bilaterally  EKG if the fibrillation with a ventricular spots of 73 and nonspecific ST-T wave changes  ASSESSMENT AND PLAN:   Severe tricuspid valve regurgitation I believe the patient's symptoms are related to a combination of his severe TR related to a dilated tricuspid annulus on top of recent A. Fib. While his symptoms are improving with diuretics I believe a surgical solution will ultimately be necessary, i.e. Tricuspid annuloplasty plus or minus surgical Maze procedure.I'm going to refer the patient back to Dr. Lilly Cove  for further evaluation.after discussion with Dr. Lilly Cove today on the phone he requested that a transesophageal echocardiogram be performed to further evaluate his valvular anatomy prior  to that office visit which I have arranged to be performed by Dr. Sallyanne Kuster.  Atrial fibrillation New since his prior EKG in our chart from 2011. I believe this is responsible for his lower extremity edema and dyspnea on exertion and accommodation with his severe TR. I suspect that he will require Coumadin anticoagulation for stroke prophylaxis given his elevated Chad2  Score. I will discuss this with Dr. Starleen Arms MD [facility], [facility] 02/08/2013 5:30 PM

## 2013-02-08 NOTE — Telephone Encounter (Signed)
See note on 02/03/13. -VRP

## 2013-02-08 NOTE — Patient Instructions (Addendum)
Your physician wants you to follow-up in: 6 WEEKS WITH AN EXTENDER AND 3 MONTHS WITH DR [name]. You will receive a reminder letter in the mail two months in advance. If you don't receive a letter, please call our office to schedule the follow-up appointment.   REFERRAL TO DR Roxy Manns FOR TRICUSPID REGURGITATION WITHIN THE NEXT 1-2 WEEKS    Dr [name] spoke with Dr Ricard Dillon and Dr Ricard Dillon would like for you to have a TEE prior to seeing him in the office.  Dr Jerilynn Mages. Croitoru (from Dr Kennon Holter office will do the TEE at [facility])   Transesophageal Echocardiography A transesophageal echocardiogram (TEE) is a special type of test that produces images of the heart by sound waves (echocardiogram). This type of echocardiogram can obtain better images of the heart than a standard echocardiogram. A TEE is done by passing a flexible tube down the esophagus. The heart is located in front of the esophagus. Because the heart and esophagus are close to one another, your caregiver can take very clear, detailed pictures of the heart via ultrasound waves. WHY HAVE A TEE? Your caregiver may need more information based on your medical condition. A TEE is usually performed due to the following:  Your caregiver needs more information based on standard echocardiogram findings.  If you had a stroke, this might have happened because a clot formed in your heart. A TEE can visualize different areas of the heart and check for clots.  To check valve anatomy and function. Your caregiver will especially look at the mitral valve.  To check for redness, soreness, and swelling (inflammation) on the inside lining of the heart (endocarditis).  To evaluate the dividing wall (septum) of the heart and presence of a hole that did not close after birth (patent foramen ovale, PFO).  To help diagnose a tear in the wall of the aorta (aortic dissection).  During cardiac valve surgery, a TEE probe is placed. This allows the surgeon to assess  the valve repair before closing the chest. LET YOUR CAREGIVER KNOW ABOUT:   Swallowing difficulties.  An esophageal obstruction.  Use of aspirin or antiplatelet therapy. RISKS AND COMPLICATIONS  Though extremely rare, an esophageal tear (rupture) is a potential complication. BEFORE THE PROCEDURE   Arrive at least 1 hour before the procedure or as told by your caregiver.  Do not eat or drink for 6 hours before the procedure or as told by your caregiver.  An intravenous (IV) access tube will be started in the arm. PROCEDURE   A medicine to help you relax (sedative) will be given through the IV.  A medicine that numbs the area (local anesthetic) may be sprayed to the back of the throat.  Your blood pressure, heart rate, and breathing (vital signs) will be monitored during the procedure.  The TEE probe is a long, flexible tube. It is about the width of an adult male's index finger. The tip of the probe is placed into the back of the mouth and you will be asked to swallow. This helps to pass the tip of the probe into the esophagus. Once the tip of the probe is in the correct area, your caregiver can take pictures of the heart.  A TEE is usually not a painful procedure. You may feel the probe press against the back of the throat. The probe does not enter the trachea and does not affect your breathing.  Your time spent at the hospital is usually less than 2 hours.  AFTER THE PROCEDURE   You will be in bed, resting until you have fully returned to consciousness.  When you first awaken, your throat may feel slightly sore and will probably still feel numb. This will improve slowly over time.  You will not be allowed to eat or drink until it is clear that numbness has improved.  Once you have been able to drink, urinate, and sit on the edge of the bed without feeling sick to your stomach (nauseous) or dizzy, you may be cleared to dress and go home.  Do not drive yourself home. You have  had medications that can continue to make you feel drowsy and can impair your reflexes.  You should have a friend or family member with you for the next 24 hours after your examination. Obtaining the test results It is your responsibility to obtain your test results. Ask the lab or department performing the test when and how you will get your results. SEEK IMMEDIATE MEDICAL CARE IF:   There is chest pain.  You have a hard time breathing or have shortness of breath.  You cough or throw up (vomit) blood. MAKE SURE YOU:   Understand these instructions.  Will watch this condition.  Will get help right away if you is not doing well or gets worse. Document Released: 11/21/2002 Document Revised: 11/23/2011 Document Reviewed: 02/12/2009 [facility] Patient Information 2014 [place], [place].

## 2013-02-09 ENCOUNTER — Encounter: Payer: Self-pay | Admitting: Cardiovascular Disease

## 2013-02-10 ENCOUNTER — Ambulatory Visit
Admission: RE | Admit: 2013-02-10 | Discharge: 2013-02-10 | Disposition: A | Discharge status: Home / Self Care | Payer: BC Managed Care – PPO | Source: Ambulatory Visit | Attending: Cardiology | Admitting: Cardiology

## 2013-02-10 DIAGNOSIS — I071 Rheumatic tricuspid insufficiency: Secondary | ICD-10-CM

## 2013-02-10 DIAGNOSIS — I77810 Thoracic aortic ectasia: Secondary | ICD-10-CM

## 2013-02-10 DIAGNOSIS — I712 Thoracic aortic aneurysm, without rupture: Secondary | ICD-10-CM

## 2013-02-10 MED ORDER — IOHEXOL 350 MG/ML SOLN
80.0000 mL | Freq: Once | INTRAVENOUS | Status: AC | PRN
  Administered 2013-02-10: 80 mL via INTRAVENOUS

## 2013-02-13 LAB — BASIC METABOLIC PANEL
BUN: 25 mg/dL — ABNORMAL HIGH (ref 6–23)
CO2: 26 mEq/L (ref 19–32)
Calcium: 9.5 mg/dL (ref 8.4–10.5)
Chloride: 103 mEq/L (ref 96–112)
Creat: 1.24 mg/dL (ref 0.50–1.35)
Glucose, Bld: 109 mg/dL — ABNORMAL HIGH (ref 70–99)
Potassium: 3.7 mEq/L (ref 3.5–5.3)
Sodium: 142 mEq/L (ref 135–145)

## 2013-02-13 LAB — APTT: aPTT: 39 seconds — ABNORMAL HIGH (ref 24–37)

## 2013-02-13 LAB — PROTIME-INR
INR: 1.08 (ref <1.50)
Prothrombin Time: 14 seconds (ref 11.6–15.2)

## 2013-02-14 ENCOUNTER — Telehealth: Payer: Self-pay | Admitting: *Deleted

## 2013-02-14 DIAGNOSIS — J90 Pleural effusion, not elsewhere classified: Secondary | ICD-10-CM

## 2013-02-14 LAB — CBC
HCT: 32.2 % — ABNORMAL LOW (ref 39.0–52.0)
Hemoglobin: 10.5 g/dL — ABNORMAL LOW (ref 13.0–17.0)
MCH: 26.2 pg (ref 26.0–34.0)
MCHC: 32.6 g/dL (ref 30.0–36.0)
MCV: 80.3 fL (ref 78.0–100.0)
Platelets: 82 10*3/uL — ABNORMAL LOW (ref 150–400)
RBC: 4.01 MIL/uL — ABNORMAL LOW (ref 4.22–5.81)
RDW: 16.5 % — ABNORMAL HIGH (ref 11.5–15.5)
WBC: 3.8 10*3/uL — ABNORMAL LOW (ref 4.0–10.5)

## 2013-02-14 NOTE — Telephone Encounter (Signed)
Message copied by Chauncy Lean on Tue Feb 14, 2013  2:30 PM ------      Message from: [name]      Created: Mon Feb 13, 2013  4:39 PM       Have pt come in to be eval by a MLP ------

## 2013-02-14 NOTE — Telephone Encounter (Signed)
Verbal order from Cecilie Kicks NP to refer to pulmonary.  Pt notified of need for referral and CT results.

## 2013-02-15 ENCOUNTER — Telehealth: Payer: Self-pay | Admitting: *Deleted

## 2013-02-15 NOTE — Telephone Encounter (Signed)
lmom 

## 2013-02-15 NOTE — Telephone Encounter (Signed)
Returning your call. °

## 2013-02-15 NOTE — Telephone Encounter (Signed)
i spoke with patient and informed him of the appt with Dr Roxy Manns

## 2013-02-15 NOTE — Telephone Encounter (Signed)
I spoke with Jenny Reichmann at Dr Guy Sandifer office.  Dr Roxy Manns will see pt on 6/9 at 9:30, arrive at 9:15. I lmom for pt to call back

## 2013-02-16 ENCOUNTER — Other Ambulatory Visit: Payer: Self-pay | Admitting: *Deleted

## 2013-02-16 DIAGNOSIS — Z01810 Encounter for preprocedural cardiovascular examination: Secondary | ICD-10-CM

## 2013-02-17 ENCOUNTER — Encounter (HOSPITAL_COMMUNITY): Payer: Self-pay

## 2013-02-17 ENCOUNTER — Ambulatory Visit (HOSPITAL_COMMUNITY)
Admission: RE | Admit: 2013-02-17 | Discharge: 2013-02-17 | Disposition: A | Discharge status: Home / Self Care | Payer: BC Managed Care – PPO | Source: Ambulatory Visit | Attending: Cardiovascular Disease | Admitting: Cardiovascular Disease

## 2013-02-17 ENCOUNTER — Encounter (HOSPITAL_COMMUNITY)
Admission: RE | Disposition: A | Discharge status: Home / Self Care | Payer: Self-pay | Source: Ambulatory Visit | Attending: Cardiovascular Disease

## 2013-02-17 DIAGNOSIS — Z01810 Encounter for preprocedural cardiovascular examination: Secondary | ICD-10-CM

## 2013-02-17 DIAGNOSIS — I071 Rheumatic tricuspid insufficiency: Secondary | ICD-10-CM

## 2013-02-17 DIAGNOSIS — I079 Rheumatic tricuspid valve disease, unspecified: Secondary | ICD-10-CM | POA: Insufficient documentation

## 2013-02-17 DIAGNOSIS — I10 Essential (primary) hypertension: Secondary | ICD-10-CM | POA: Insufficient documentation

## 2013-02-17 DIAGNOSIS — I080 Rheumatic disorders of both mitral and aortic valves: Secondary | ICD-10-CM | POA: Insufficient documentation

## 2013-02-17 DIAGNOSIS — I712 Thoracic aortic aneurysm, without rupture, unspecified: Secondary | ICD-10-CM | POA: Insufficient documentation

## 2013-02-17 DIAGNOSIS — E785 Hyperlipidemia, unspecified: Secondary | ICD-10-CM | POA: Insufficient documentation

## 2013-02-17 DIAGNOSIS — I4891 Unspecified atrial fibrillation: Secondary | ICD-10-CM | POA: Insufficient documentation

## 2013-02-17 DIAGNOSIS — I7100 Dissection of unspecified site of aorta: Secondary | ICD-10-CM

## 2013-02-17 DIAGNOSIS — Z79899 Other long term (current) drug therapy: Secondary | ICD-10-CM | POA: Insufficient documentation

## 2013-02-17 DIAGNOSIS — D649 Anemia, unspecified: Secondary | ICD-10-CM | POA: Insufficient documentation

## 2013-02-17 HISTORY — PX: TEE WITHOUT CARDIOVERSION: SHX5443

## 2013-02-17 SURGERY — ECHOCARDIOGRAM, TRANSESOPHAGEAL
Anesthesia: Moderate Sedation

## 2013-02-17 MED ORDER — MIDAZOLAM HCL 10 MG/2ML IJ SOLN
INTRAMUSCULAR | Status: DC | PRN
  Administered 2013-02-17 (×3): 1 mg via INTRAVENOUS

## 2013-02-17 MED ORDER — MIDAZOLAM HCL 5 MG/ML IJ SOLN
INTRAMUSCULAR | Status: AC
  Filled 2013-02-17: qty 2

## 2013-02-17 MED ORDER — FENTANYL CITRATE 0.05 MG/ML IJ SOLN
INTRAMUSCULAR | Status: AC
  Filled 2013-02-17: qty 4

## 2013-02-17 MED ORDER — BUTAMBEN-TETRACAINE-BENZOCAINE 2-2-14 % EX AERO
INHALATION_SPRAY | CUTANEOUS | Status: DC | PRN
  Administered 2013-02-17: 2 via TOPICAL

## 2013-02-17 MED ORDER — SODIUM CHLORIDE 0.9 % IV SOLN
INTRAVENOUS | Status: DC
  Administered 2013-02-17: 500 mL via INTRAVENOUS

## 2013-02-17 MED ORDER — FENTANYL CITRATE 0.05 MG/ML IJ SOLN
INTRAMUSCULAR | Status: DC | PRN
  Administered 2013-02-17: 25 ug via INTRAVENOUS

## 2013-02-17 NOTE — H&P (Signed)
Date of Initial H&P: 02/08/2013 Dr. [name]  History reviewed, patient examined, no change in status, stable for surgery. TEE for severe TR, possible surgical valve repair candidate. This procedure has been fully reviewed with the patient and written informed consent has been obtained. [name], MD, [place] [phone] office [phone] pager

## 2013-02-17 NOTE — CV Procedure (Signed)
INDICATIONS: tricuspid valve disease  PROCEDURE:   Informed consent was obtained prior to the procedure. The risks, benefits and alternatives for the procedure were discussed and the patient comprehended these risks.  Risks include, but are not limited to, cough, sore throat, vomiting, nausea, somnolence, esophageal and stomach trauma or perforation, bleeding, low blood pressure, aspiration, pneumonia, infection, trauma to the teeth and death.    After a procedural time-out, the oropharynx was anesthetized with 20% benzocaine spray. The patient was given 3 mg versed and 25 mcg fentanyl for moderate sedation.   The transesophageal probe was inserted in the esophagus and stomach without difficulty and multiple views were obtained.  The patient was kept under observation until the patient left the procedure room.  The patient left the procedure room in stable condition.   Agitated microbubble saline contrast was not administered.  COMPLICATIONS:    There were no immediate complications.  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 There is no evidence of leaflet prolapse, chordal rupture or vegetation.  RECOMMENDATIONS:     Follow up with [name] to discuss surgery  Time Spent Directly with the Patient:  23minutes   Bular Hickok 02/17/2013, 2:55 PM

## 2013-02-17 NOTE — Progress Notes (Signed)
  Echocardiogram Echocardiogram Transesophageal has been performed.  [name] 02/17/2013, 3:01 PM

## 2013-02-19 ENCOUNTER — Encounter (HOSPITAL_COMMUNITY): Payer: Self-pay | Admitting: Cardiovascular Disease

## 2013-02-20 ENCOUNTER — Ambulatory Visit (INDEPENDENT_AMBULATORY_CARE_PROVIDER_SITE_OTHER): Payer: BC Managed Care – PPO | Admitting: Thoracic Surgery (Cardiothoracic Vascular Surgery)

## 2013-02-20 ENCOUNTER — Telehealth: Payer: Self-pay | Admitting: Diagnostic Neuroimaging

## 2013-02-20 ENCOUNTER — Institutional Professional Consult (permissible substitution): Payer: BC Managed Care – PPO | Admitting: Internal Medicine

## 2013-02-20 ENCOUNTER — Encounter: Payer: Self-pay | Admitting: Thoracic Surgery (Cardiothoracic Vascular Surgery)

## 2013-02-20 VITALS — BP 126/68 | HR 69 | Resp 20 | Ht 68.0 in | Wt 172.0 lb

## 2013-02-20 DIAGNOSIS — I7100 Dissection of unspecified site of aorta: Secondary | ICD-10-CM

## 2013-02-20 DIAGNOSIS — I4891 Unspecified atrial fibrillation: Secondary | ICD-10-CM

## 2013-02-20 DIAGNOSIS — J90 Pleural effusion, not elsewhere classified: Secondary | ICD-10-CM

## 2013-02-20 DIAGNOSIS — I7122 Aneurysm of the aortic arch, without rupture: Secondary | ICD-10-CM

## 2013-02-20 DIAGNOSIS — I712 Thoracic aortic aneurysm, without rupture: Secondary | ICD-10-CM

## 2013-02-20 DIAGNOSIS — I079 Rheumatic tricuspid valve disease, unspecified: Secondary | ICD-10-CM

## 2013-02-20 DIAGNOSIS — I7101 Dissection of thoracic aorta: Secondary | ICD-10-CM | POA: Insufficient documentation

## 2013-02-20 DIAGNOSIS — I071 Rheumatic tricuspid insufficiency: Secondary | ICD-10-CM

## 2013-02-20 DIAGNOSIS — I71019 Dissection of thoracic aorta, unspecified: Secondary | ICD-10-CM

## 2013-02-20 DIAGNOSIS — I340 Nonrheumatic mitral (valve) insufficiency: Secondary | ICD-10-CM

## 2013-02-20 DIAGNOSIS — R600 Localized edema: Secondary | ICD-10-CM | POA: Insufficient documentation

## 2013-02-20 DIAGNOSIS — I059 Rheumatic mitral valve disease, unspecified: Secondary | ICD-10-CM

## 2013-02-20 DIAGNOSIS — R609 Edema, unspecified: Secondary | ICD-10-CM

## 2013-02-20 HISTORY — DX: Nonrheumatic mitral (valve) insufficiency: I34.0

## 2013-02-20 NOTE — Progress Notes (Addendum)
[name] [address]       [name] [id number]             [id number]     CARDIOTHORACIC SURGERY CONSULTATION REPORT  Referring Provider is Timothy Harp, MD PCP is [name], Timothy Bachelor, MD  Chief Complaint  Patient presents with  . Tricuspid Regurgitation    Surgical eval,  ECHO 02/17/13, CTA Chest 02/10/13  . 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 He underwent emergency surgical repair with hemi-arch replacement of the ascending thoracic aorta with resuspension of the native aortic valve.  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 He was seen most recently here in our office on 04/04/2012. CT angiogram of the chest, abdomen and pelvis remained stable at that time.  The patient's functional status has been chronically limited over the years because of his long-standing battle with multiple sclerosis. His overall strength and physical mobility has slowly deteriorated over the years. In the past he was followed for by Dr. Erling Henry, but more recently he has been followed by Dr. Leta Henry who saw him in the office last month.  At that time it was noted that he may be moving into the secondary progressive phase of his disease, and the patient has been contemplating making a change in medical therapy.  The patient has a long history of hypertension with hypertensive cardiomyopathy and exertional shortness of breath. He had an echocardiogram performed in March of 2011 that revealed normal left ventricular systolic function but flattened septum consistent with a right ventricular pressure and volume overload. At that time he was noted to have mild to moderate mitral regurgitation  with moderate to severe tricuspid regurgitation.  The patient notes that over the last 3 years he has had a gradual progression of symptoms of mild exertional shortness of breath.  He states that for the most part his breathing only bothers him when he is swimming. He used to swim on a regular basis for exercise, but within the last few months he got to the point where he could no longer swim because of dyspnea. He states that otherwise with normal activity he does not get short of breath, but he has developed worsening fatigue.  Patient also has a long history of chronic bilateral lower extremity edema.  Over the last few months his lower extremity edema considerably worse, ultimately prompting him to present to Dr. Nyoka Henry for evaluation who referred him back to Dr. Gwenlyn Henry for follow up.  At that time he was noted to be in stable rate controlled atrial fibrillation.  Repeat transthoracic echocardiogram demonstrated stable left ventricular systolic function with ejection fraction estimated 50-55%. There was reportedly mild to moderate mitral regurgitation with left and right atrial chamber enlargement. There was severe tricuspid regurgitation with flow reversal in the hepatic veins. Pulmonary artery pressures were estimated 44 mm mercury.  Transesophageal echocardiogram performed last week confirmed the presence of severe tricuspid regurgitation and moderate mitral regurgitation. The patient has been referred for surgical consultation to consider tricuspid valve repair and possible Maze procedure.  The patient reports that over the last 2 months he has diuresed nearly 30 pounds on oral Lasix therapy. He is feeling  considerably better. He reports only mild exertional shortness of breath.  He has not tried to get back in a swimming pool. He denies resting shortness of breath, PND, or orthopnea, or tachypalpitations, dizzy spells, or syncope. His lower extremity edema has markedly improved.  He has never had any chest  pain, chest tightness, chest fullness, or back pain.  Past Medical History  Diagnosis Date  . Neuromuscular disorder   . Hypertension   . Anemia   . Depression   . Multiple sclerosis   . Thrombocytopenia   . Dyslipidemia   . Aortic dissection, thoracic   . BPH (benign prostatic hyperplasia)   . Hemorrhoids   . Aortic dissection 11/05/1998    S/P emergency repair of acute type A aortic dissection with resuspension of native aortic valve  . 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 Chest pain 11/18/2009    2D Echo - EF >55%, right ventricle is moderately dilated, right atrium is severely dilated, moderate-severe tricuspid regurgitation; R/P Myoview - EF 56%, no scintigraphic evidence of inducible myocardial ischemia, EKG negative for ischemia,   . CHF (congestive heart failure) 1945-09-19    2D Echo - EF 50-55%, mild-moderately dilated right ventricle, mild-moderate tricuspid valve regurgitation, moderately dilated right atrium  . Bilateral lower extremity edema   . Atrial fibrillation 01/30/2013    Atrial fibrillation   . Pleural effusion, right, large 01/30/2013  . Severe tricuspid valve regurgitation 01/30/2013    Severe tricuspid regurg by recent 2-D echo with a dilated tricuspid annulus, moderate pulmonary hypertension and biatrial enlargement   . Mitral regurgitation 02/20/2013  . Lower extremity edema 02/20/2013    Past Surgical History  Procedure Laterality Date  . Internal, external hemorrhoidectomy, general anesthesia,prone position.  2009  . Open reduction and internal fixation of right distal radius fracture using hand innovations distal radius volar locking plate.  07/2005    Dr Ninfa Linden  . Tee without cardioversion N/A 02/17/2013    Procedure: TRANSESOPHAGEAL ECHOCARDIOGRAM (TEE);  Surgeon: [name], MD;  Location: [facility] ENDOSCOPY;  Service:  Cardiovascular;  Laterality: N/A;  . Repair of acute type a aortic dissection with resuspension of native aortic valve  10/15/1998    Dr Roxy Henry    Family History  Problem Relation Age of Onset  . Thyroid cancer Father   . Lung cancer Brother   . Heart attack Mother     History   Social History  . Marital Status: Married    Spouse Name: N/A    Number of Children: N/A  . Years of Education: N/A   Occupational History  . Not on file.   Social History Main Topics  . Smoking status: Never Smoker   . Smokeless tobacco: Not on file  . Alcohol Use: Yes     Comment: occasionally  . Drug Use: Not on file  . Sexually Active: Not on file   Other Topics Concern  . Not on file   Social History Narrative  . No narrative on file    Current Outpatient Prescriptions  Medication Sig Dispense Refill  . amLODipine (NORVASC) 5 MG tablet Take 5 mg by mouth daily.        . Dalfampridine (AMPYRA PO) Take 1 tablet by mouth 2 (two) times daily. Pt unsure of dose      . DIOVAN 320 MG tablet Take 320 mg by mouth daily.       Marland Kitchen  doxazosin (CARDURA) 4 MG tablet Take 4 mg by mouth at bedtime.        . folic acid (FOLVITE) 1 MG tablet Take 1 mg by mouth daily.        . furosemide (LASIX) 40 MG tablet Take 40-80 mg by mouth 2 (two) times daily. Take 2 tablet in the morning and 1 tablet at night      . interferon beta-1b (BETASERON) 0.3 MG injection Inject 0.3 mg into the skin every other day.        . metoprolol (LOPRESSOR) 100 MG tablet Take 100 mg by mouth 2 (two) times daily.        . potassium chloride (K-DUR) 10 MEQ tablet Take 2 tablets (20 mEq total) by mouth 2 (two) times daily.  120 tablet  6  . QUEtiapine (SEROQUEL) 25 MG tablet Take 25 mg by mouth at bedtime. prn       . rosuvastatin (CRESTOR) 5 MG tablet Take 5 mg by mouth daily.         No current facility-administered medications for this visit.    No Known Allergies    Review of Systems:   General:  decreased appetite,  decreased energy, no weight gain, + 30 lb weight loss, no fever  Cardiac:  no chest pain with exertion, no chest pain at rest, + SOB with moderate exertion, no resting SOB, no PND, no orthopnea, no palpitations, + arrhythmia, + atrial fibrillation, + LE edema, no dizzy spells, no syncope  Respiratory:  + exertional shortness of breath, no home oxygen, no productive cough, no dry cough, no bronchitis, no wheezing, no hemoptysis, no asthma, no pain with inspiration or cough, no sleep apnea, no CPAP at night  GI:   + difficulty swallowing attributed to MS with tendency to aspirate at times, no reflux, no frequent heartburn, no hiatal hernia, no abdominal pain, no constipation, no diarrhea, no hematochezia, no hematemesis, no melena  GU:   no dysuria,  + frequency, no urinary tract infection, no hematuria, no enlarged prostate, no kidney stones, no kidney disease  Vascular:  no pain suggestive of claudication, no pain in feet, no leg cramps, no varicose veins, no DVT, no non-healing foot ulcer  Neuro:   no stroke, no TIA's, no seizures, no headaches, no temporary blindness one eye,  no slurred speech, + peripheral neuropathy, no chronic pain, + instability of gait, + memory/cognitive dysfunction  Musculoskeletal: no arthritis, + joint swelling, no myalgias, + difficulty walking, limited mobility   Skin:   no rash, no itching, no skin infections, no pressure sores or ulcerations  Psych:   no anxiety, + depression, no nervousness, no unusual recent stress  Eyes:   no blurry vision, no floaters, no recent vision changes, + wears glasses or contacts  ENT:   no hearing loss, no loose or painful teeth, no dentures  Hematologic:  + easy bruising, no abnormal bleeding, + clotting disorder, no frequent epistaxis  Endocrine:  no diabetes, does not check CBG's at home     Physical Exam:   BP 126/68  Pulse 69  Resp 20  Ht 5\' 8"  (1.727 m)  Wt 172 lb (78.019 kg)  BMI 26.16 kg/m2  SpO2  99%  General:  Chronically ill-appearing  HEENT:  Unremarkable   Neck:   no JVD, no bruits, no adenopathy   Chest:   Slightly diminshed breath sounds on right side, no wheezes, no rhonchi   CV:   RRR, no murmur  Abdomen:  soft, non-tender, no masses, no obvious ascites  Extremities:  warm, well-perfused, pulses palpable, + bilateral LE edema  Rectal/GU  Deferred  Neuro:   Grossly non-focal and symmetrical throughout  Skin:   Clean and dry, no rashes, no breakdown   Diagnostic Tests:  TRANSTHORACIC ECHOCARDIOGRAM  Report of transthoracic echocardiogram performed 11/18/2009 at the [facility] is reviewed. By report at that time left ventricular size was normal with normal left ventricular systolic function, ejection fraction estimated at greater than or equal to 55%. However, there was report of flattened septum consistent with right ventricular pressure and volume overload. The right ventricle was mild to moderately dilated with normal right ventricular systolic function. There was severe right atrial enlargement. There was mild to moderate mitral regurgitation with moderate to severe tricuspid regurgitation. Aortic valve is trileaflet. There was mild aortic regurgitation.    Transthoracic Echocardiography  Patient: [name], [name] MR #: [id number] Study Date: 01/17/2013 Gender: M Age: 42 Height: Weight: BSA: Pt. Status: Room:  PERFORMING Shvc Karl Luke SONOGRAPHER Melissa Morford, RDCS cc:  ------------------------------------------------------------ LV EF: 50% - 55%  ------------------------------------------------------------ Indications: Edema 782.3.  ------------------------------------------------------------ History: PMH: H/O aortic dissection repair 14 years ago. Congestive heart failure.  ------------------------------------------------------------ Study Conclusions  - Left ventricle: The cavity size was normal.  Wall thickness was normal. Systolic function was normal. The estimated ejection fraction was in the range of 50% to 55%. Wall motion was normal; there were no regional wall motion abnormalities. LV diastolic function cannot be assessed due to underlying arryhthmia. The E/e' ratio is <10, suggesting normal LV filling pressure. - Aortic valve: Trileaflet. Dilated sinotubular junction. Trivial to mild central regurgitation. - Aorta: Dilated aortic root at the ST junction, however, there appears to be an excluded lumen and possible graft material - suggest CT aortogram for better visualization of the aortic root. - Mitral valve: Mildly thickened leaflets . Mild to moderate regurgitation. - Left atrium: Severely dilated (35.5 cm2) - Right ventricle: The cavity size was moderately dilated. Systolic function was normal. - Right atrium: Severely dilated (33.5 cm2) - Tricuspid valve: Severe regurgitation with reversal of flow in the hepatic veins. Dilated tricuspid annulus measuring 6.16 cm. - Pulmonary arteries: PA peak pressure: 72mm Hg (S). PA end-diastolic velocity >2 m/sec, suggesting pulmonary hypertension. - Inferior vena cava: The vessel was dilated; the respirophasic diameter changes were blunted (< 50%); findings are consistent with elevated central venous pressure. Impressions:  - Severe functional TR with a dilated annulus - may benefit from surgical consultation. Aortic root graft is best assessed with CT aortogram. Transthoracic echocardiography. M-mode, complete 2D, spectral Doppler, and color Doppler. Patient status: Outpatient. Location: Echo laboratory.  ------------------------------------------------------------  ------------------------------------------------------------ Left ventricle: The cavity size was normal. Wall thickness was normal. Systolic function was normal. The estimated ejection fraction was in the range of 50% to 55%. Wall motion was normal;  there were no regional wall motion abnormalities. LV diastolic function cannot be assessed due to underlying arryhthmia. The E/e' ratio is <10, suggesting normal LV filling pressure.  ------------------------------------------------------------ Aortic valve: Trileaflet. Dilated sinotubular junction. Doppler: Trivial to mild central regurgitation.  ------------------------------------------------------------ Aorta: Dilated aortic root at the ST junction, however, there appears to be an excluded lumen and possible graft material - suggest CT aortogram for better visualization of the aortic root.  ------------------------------------------------------------ Mitral valve: Mildly thickened leaflets . Doppler: Mild to moderate regurgitation. Peak gradient: 23mm Hg (D).  ------------------------------------------------------------ Left atrium: Severely dilated (35.5 cm2)  ------------------------------------------------------------  Right ventricle: The cavity size was moderately dilated. Systolic function was normal.  ------------------------------------------------------------ Tricuspid valve: Doppler: Severe regurgitation with reversal of flow in the hepatic veins. Dilated tricuspid annulus measuring 6.16 cm.  ------------------------------------------------------------ Pulmonary artery: PA end-diastolic velocity >2 m/sec, suggesting pulmonary hypertension.  ------------------------------------------------------------ Right atrium: Severely dilated (33.5 cm2)  ------------------------------------------------------------ Pericardium: There was no pericardial effusion.  ------------------------------------------------------------ Systemic veins: Inferior vena cava: The vessel was dilated; the respirophasic diameter changes were blunted (< 50%); findings are consistent with elevated central venous pressure.  ------------------------------------------------------------ Post  procedure conclusions Ascending Aorta:  - Dilated aortic root at the ST junction, however, there appears to be an excluded lumen and possible graft material - suggest CT aortogram for better visualization of the aortic root.  ------------------------------------------------------------  2D measurements Normal Doppler measurements Normal Left ventricle Main pulmonary LVID ED, 47.6 mm 43-52 artery chord, PLAX Pressure, S 44 mm =30 LVID ES, 34.7 mm 23-38 Hg chord, PLAX Left ventricle FS, chord, 27 % >29 Ea, lat ann, 12. cm/s ------ PLAX tiss DP 9 LVPW, ED 9.64 mm ------ E/Ea, lat 7.0 ------ IVS/LVPW 1.1 <1.3 ann, tiss DP 4 ratio, ED Ea, med ann, 13. cm/s ------ Ventricular septum tiss DP 6 IVS, ED 10.6 mm ------ E/Ea, med 6.6 ------ Aorta ann, tiss DP 8 Root diam, 49 mm ------ Aortic valve ED Regurg PHT 504 ms ------ Left atrium Mitral valve AP dim 47 mm ------ Peak E vel 90. cm/s ------ 8 Peak A vel 26. cm/s ------ 7 Deceleration 141 ms 150-23 time 0 Peak 3 mm ------ gradient, D Hg Peak E/A 3.4 ------ ratio Tricuspid valve Regurg peak 267 cm/s ------ vel Peak RV-RA 29 mm ------ gradient, S Hg Systemic veins Estimated CVP 10 mm ------ Hg Right ventricle Pressure, S 39 mm <30 Hg  ------------------------------------------------------------ Prepared and Electronically Authenticated by  Lyman Bishop 2014-05-06T16:12:44.760    Transesophageal Echocardiography  Patient: Dyle, Renfro MR #: [id number] Study Date: 02/17/2013 Gender: M Age: 74 Height: 172.7cm Weight: 78.2kg BSA: 1.25m^2 Pt. Status: Room: [facility] Quay Burow, MD ADMITTING Croitoru, Mihai ATTENDING Croitoru, Mihai PERFORMING Croitoru, Mihai SONOGRAPHER Mauricio Po, RDCS, CCT cc:  ------------------------------------------------------------ LV EF: 55% - 60%  ------------------------------------------------------------ Indications: Tricuspid insufficiency  424.2.  ------------------------------------------------------------ History: PMH: Aortic dissection.  ------------------------------------------------------------ Study Conclusions  - Left ventricle: Systolic function was normal. The estimated ejection fraction was in the range of 55% to 60%. Wall motion was normal; there were no regional wall motion abnormalities. - Aortic valve: No evidence of vegetation. Mild regurgitation. - Mitral valve: No evidence of vegetation. Mild to moderate regurgitation. - Left atrium: No evidence of thrombus in the atrial cavity or appendage. - Right ventricle: The cavity size was severely dilated. - Right atrium: The atrium was severely dilated. - Tricuspid valve: Severely dilated annulus. Normal thickness, noncalcified leaflets. There was malcoaptation of the valve leaflets. No evidence of vegetation. Severe regurgitation originating from the central coaptation point and directed toward the septum. - Pulmonic valve: No evidence of vegetation. - Pulmonary arteries: Systolic pressure was mildly increased. PA peak pressure: 81mm Hg (S). Transesophageal echocardiography. 2D and color Doppler. Height: Height: 172.7cm. Height: 68in. Weight: Weight: 78.2kg. Weight: 172lb. Body mass index: BMI: 26.2kg/m^2. Body surface area: BSA: 1.72m^2. Blood pressure: 143/90. Patient status: Outpatient. Location: Endoscopy.  ------------------------------------------------------------  ------------------------------------------------------------ Left ventricle: Systolic function was normal. The estimated ejection fraction was in the range of 55% to 60%. Wall motion was normal; there were no regional wall motion abnormalities.  ------------------------------------------------------------ Aortic valve:  Structurally normal valve. Cusp separation was normal. No evidence of vegetation. Doppler:  Mild regurgitation.  ------------------------------------------------------------ Aorta: Aneurysmal dilation of the sinuses of Valsalva. S/P surgical graft repair of the ascending aorta. Dissection of the non-dilated descending thoracic aorta with widely patent true and false lumen of roughly equal caliber. The arch is not well visualized.  ------------------------------------------------------------ Mitral valve: Structurally normal valve. Leaflet separation was normal. No evidence of vegetation. Doppler: Mild to moderate regurgitation.  ------------------------------------------------------------ Left atrium: The atrium was normal in size. No evidence of thrombus in the atrial cavity or appendage.  ------------------------------------------------------------ Right ventricle: The cavity size was severely dilated. Systolic function was normal.  ------------------------------------------------------------ Pulmonic valve: Structurally normal valve. Cusp separation was normal. No evidence of vegetation.  ------------------------------------------------------------ Tricuspid valve: Severely dilated annulus. Normal thickness, noncalcified leaflets. Anullus diameter is 61 x 49 mm. Leaflet separation was normal. No echocardiographic evidence for prolapse. There was malcoaptation of the valve leaflets. There was no evidence of Ebstein anomaly of the tricuspid valve. No evidence of vegetation. Doppler: Severe regurgitation originating from the central coaptation point and directed toward the septum.  ------------------------------------------------------------ Pulmonary artery: Systolic pressure was mildly increased.  ------------------------------------------------------------ Right atrium: The atrium was severely dilated.  ------------------------------------------------------------ Pericardium: There was no pericardial  effusion.  ------------------------------------------------------------ Systemic veins: Severely dilated inferior vena cava and hepatic veins.  ------------------------------------------------------------ Post procedure conclusions Ascending Aorta:  - Aneurysmal dilation of the sinuses of Valsalva. S/P surgical graft repair of the ascending aorta. Dissection of the non-dilated descending thoracic aorta with widely patent true and false lumen of roughly equal caliber. The arch is not well visualized.  ------------------------------------------------------------  2D measurements Normal Doppler measurements Normal Aorta Main pulmonary Root max 49.27 mm ------ artery diam, ED Pressure, 40 mm =30 Right ventricle S Hg Minor axis 73.87 mm 26-43 Tricuspid valve ED, A4C Regurg 224.09 cm/s ------ peak vel Peak RV-RA 20 mm ------ gradient, Hg S Max regurg 224.09 cm/s ------ vel Systemic veins Estimated 20 mm ------ CVP Hg Right ventricle Pressure, 40 mm <30 S Hg  ------------------------------------------------------------ Prepared and Electronically Authenticated by  [employer], Mihai 2014-06-06T19:15:29.010        CT ANGIOGRAPHY CHEST   Carylon Perches, MD Sat Feb 11, 2013 7:45:02 AM EDT       **ADDENDUM** CREATED: 02/11/2013 07:42:06  Stable configuration and extent of the thoracic aortic dissection.  **END ADDENDUM** SIGNED BY: Markus Daft, M.D.      Study Result    *RADIOLOGY REPORT*  Clinical Data: History of a hemiarch repair for a type A aortic  dissection. Surgery was in 2000.  CT ANGIOGRAPHY CHEST  Technique: Multidetector CT imaging of the chest using the  standard protocol during bolus administration of intravenous  contrast. Multiplanar reconstructed images including MIPs were  obtained and reviewed to evaluate the vascular anatomy.  Contrast: 83mL OMNIPAQUE IOHEXOL 350 MG/ML SOLN  Comparison: 04/01/2012, 11/15/2009 and 06/18/2008  Findings: The  aortic root at the sinuses of Valsalva roughly  measure 5 cm and unchanged on image 72. Stable postoperative  changes. Again noted is a dissection just beyond the surgical  graft. The thoracic aorta just beyond the graft measures 5.9 x 5.2  cm, previously measured 5.9 x 5.2 cm. This segment roughly measured  5.0 x 5.3 cm in 2011. The dissection extends into the right  innominate artery and right common carotid artery. Great vessels  are patent. Configuration of the aortic dissection has not  significantly changed.  The left subclavian artery is patent but there may  be a high-grade  stenosis near the origin of the left vertebral artery. This  finding is similar to the previous examinations. The dissection  extends into the descending thoracic aorta. The proximal  descending thoracic aorta measures 3.4 x 3.6 cm and unchanged. The  true lumen appears to be along the left side of the aorta and  becomes narrowed in the upper abdominal aorta. Findings are  similar to the previous examination.  Again noted are small lymph nodes and stranding throughout the  mediastinum which is similar to the previous examination. Right  pleural effusion has enlarged. There is ascites around the liver.  Again noted is a ill-defined 4 cm low density lesion in the left  hepatic lobe. A small amount of fluid around the spleen. Heart  size is upper limits of normal. No significant pericardial fluid.  The trachea and mainstem bronchi are patent. Left lung remains  clear. There appears to be collapse of the right middle lobe. No  acute bony abnormality.  IMPRESSION:  Stable postoperative changes consistent with a hemiarch repair. The  size of the thoracic aorta has not significantly changed since 2013  but there has been mild enlargement since 2009. The aorta now  measures up to 5.9 cm.  Right pleural effusion has enlarged. Right pleural effusion is  large.  New collapse of the right middle lobe.  Abdominal  ascites. Again noted is an irregular lesion in the left  hepatic lobe.  Original Report Authenticated By: Markus Daft, M.D.   CT ABDOMEN AND PELVIS WITH CONTRAST  Technique: Multidetector CT imaging of the abdomen and pelvis was  performed following the standard protocol during bolus  administration of intravenous contrast.  Contrast: 116mL OMNIPAQUE IOHEXOL 300 MG/ML SOLN  Comparison: 04/01/2012  Findings: A chronic aortic dissection extends from the descending  visualized thoracic aorta into the right common iliac artery. The  celiac axis, superior mesenteric artery and single renal arteries  bilaterally all arises from the true lumen. There is  atherosclerotic plaque at the origins of these vessels. The only  significant narrowing is of the origin of the left renal artery,  estimated 70% stenosis.  An irregular, 4.5 cm, hypoattenuating area in the lateral segment  of the left lobe of the liver, extending from the anterior surface,  which is stable from the prior CT likely a benign post traumatic  lesion or cyst. The liver is otherwise unremarkable. Normal  spleen. There are gallstones and a mostly decompressed  gallbladder. No bile duct dilation. Normal pancreas. No adrenal  masses. Mild bilateral renal cortical thinning is noted. There  are bilateral renal cysts. No solid masses or hydronephrosis.  Normal ureters and bladder.  There is a small amount of ascites collecting primarily adjacent to  the liver and spleen and tracking along the pericolic gutters into  the pelvis. No pathologically enlarged lymph nodes.  There is mild increase stool the colon. The bowel is there is a  mild to large right pleural effusion with associated dependent  right lower lobe atelectasis. The heart is mildly enlarged.  There is diffuse subcutaneous edema in the lower abdomen and  proximal thighs.  Degenerative changes are noted of the visualized spine. No  osteoblastic or osteolytic lesions.  otherwise unremarkable. A  normal appendix is not discretely seen, but there is no evidence of  appendicitis.  IMPRESSION:  Chronic aortic dissection that extends into the right common iliac  artery.  Small amount of ascites. Large right pleural effusion. Diffuse  subcutaneous edema. These findings may be on the basis of elevated  right heart filling pressures supported by cardiomegaly.  No convincing acute findings in the abdomen pelvis. Note is made  of a stable, irregular hypoattenuating lesion in the left lobe of  the liver, gallstones and renal cysts as well as degenerative  changes of the visualized spine.  Original Report Authenticated By: Lajean Manes, M.D.    Impression:  The patient has severe tricuspid regurgitation that is chronic and long-standing. Transesophageal echocardiogram confirms functional anatomy suggestive of pure annular dilatation with type I dysfunction and severe right atrial enlargement. The patient also has type 1 dysfunction of the mitral valve with moderate mitral regurgitation.  Left ventricular systolic function appears normal with exception of septal flattening do to right ventricular pressure and volume overload. The patient has chronic bilateral lower extremity edema that recently had become markedly worse. The patient also has long-standing chronic exertional shortness of breath that has progressed, all of which coincident with the development of persistent atrial fibrillation. All of his symptoms have improved considerably over the last 6-8 weeks with diuretic therapy, and he reportedly has diuresed over 30 pounds of fluid.  Unfortunately, risks associated with tricuspid valve repair or replacement with or without concomitant Maze procedure and or mitral valve repair would be extraordinarily high in this patient. The patient suffers chronically from multiple sclerosis with significant chronic weakness, instability of gait, and dysphagia with tendency for  chronic aspiration. 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  This aneurysmal enlargement remained stable but slightly greater than 5 cm in diameter.  Finally, the patient has at least a moderate-sized right pleural effusion that is almost certainly related to chronic diastolic congestive heart failure. He might benefit from thoracentesis for both diagnostic and therapeutic purposes, and the obvious question is whether or not the pleural effusion will reaccumulate now that he is on diuretic therapy.   Plan:  I would not consider surgical treatment for tricuspid regurgitation at this time. I would recommend continued medical therapy. I do think the patient should undergo right thoracentesis for both diagnostic and therapeutic purposes. I have offered to schedule this procedure but the patient has been scheduled appointment with Dr. Melvyn Novas for further management of this problem.  I spent in excess of 45 minutes with the patient and his wife discussing matters at length. I emphasized the fact that surgery would come with exceptionally high-risk and not likely be associated with any potential improvement in her long-term survival.  Moreover the patient seems to be doing well with medical therapy at this point. We will have him return in 6 months to see how he is getting along.    Timothy Gu. Roxy Manns, MD 02/20/2013 12:19 PM

## 2013-02-24 ENCOUNTER — Encounter: Payer: Self-pay | Admitting: Internal Medicine

## 2013-02-24 ENCOUNTER — Ambulatory Visit (INDEPENDENT_AMBULATORY_CARE_PROVIDER_SITE_OTHER): Payer: BC Managed Care – PPO | Admitting: Internal Medicine

## 2013-02-24 VITALS — BP 110/60 | HR 46 | Temp 97.7°F | Ht 68.0 in | Wt 168.0 lb

## 2013-02-24 DIAGNOSIS — J90 Pleural effusion, not elsewhere classified: Secondary | ICD-10-CM

## 2013-02-24 NOTE — Patient Instructions (Addendum)
Increase fluid pill to take extra dose daily until return in one week on Friday am

## 2013-02-24 NOTE — Progress Notes (Signed)
  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     02/24/2013 1st pulmonary eval cc worsening doe with swimming x one year indolent onset progressive and assoc with leg swelling to  groin r x with diuretics> 30 lb wt off but still sign bilatera le edema and obvious ascites. No longer limited by sob but less active (no swimming any more) since onset of sob.  No obvious daytime variabilty or assoc chronic cough or cp or chest tightness, subjective wheeze overt sinus or hb symptoms. No unusual exp hx or h/o childhood pna/ asthma or knowledge of premature birth.   Sleeping ok without nocturnal  or early am exacerbation  of respiratory  c/o's or need for noct saba. Also denies any obvious fluctuation of symptoms with weather or environmental changes or other aggravating or alleviating factors except as outlined above     Review of Systems  Constitutional: Negative for fever, chills, activity change, appetite change and unexpected weight change.  HENT: Negative for congestion, sore throat, rhinorrhea, sneezing, trouble swallowing, dental problem, voice change and postnasal drip.   Eyes: Negative for visual disturbance.  Respiratory: Positive for shortness of breath. Negative for cough and choking.   Cardiovascular: Negative for chest pain and leg swelling.  Gastrointestinal: Negative for nausea, vomiting and abdominal pain.  Genitourinary: Negative for difficulty urinating.  Musculoskeletal: Negative for arthralgias.  Skin: Negative for rash.  Psychiatric/Behavioral: Negative for behavioral problems and confusion.       Objective:   Physical Exam  Wt Readings from Last 3 Encounters:  02/24/13 168 lb (76.204 kg)  02/20/13 172 lb (78.019 kg)  02/08/13 172 lb (78.019 kg)     Very frail, needs full assist to get on exam table  HEENT: nl dentition, turbinates, and orophanx. Nl external ear canals without cough reflex   NECK :  without JVD/Nodes/TM/ nl carotid upstrokes bilaterally   LUNGS: no acc muscle use, distant bs R base with dullness  CV:  RRR  no s3 or murmur or increase in P2, no edema   ABD:  soft and nontender with nl excursion in the supine position. Obvious ascites, moderate hepatomegaly with ? Liver mass medially   MS:  warm without deformities, calf tenderness, cyanosis or clubbing  SKIN: warm and dry without lesions    NEURO:  alert, approp,very awkward gait     CT 02/10/13 Stable postoperative changes consistent with a hemiarch repair. The  size of the thoracic aorta has not significantly changed since 2013  but there has been mild enlargement since 2009. The aorta now  measures up to 5.9 cm.  Right pleural effusion has enlarged. Right pleural effusion is  large.  New collapse of the right middle lobe.  Abdominal ascites. Again noted is an irregular lesion in the left  hepatic lobe.        Assessment & Plan:

## 2013-02-25 NOTE — Assessment & Plan Note (Signed)
Assoc with ascites and severe LAE/ RAE related to valvular heart dz with persistent pitting edema are all strongly suggestive of a chronic hydrostatic R Pleural effusion which can be confirmed on R thoracentesis but usually isn't necessary if resolves with optimal diuresis which typically requires use of aldactone and is slower than reabsorption of peripheral edema as it is third spaced fluid.  Discussed in detail all the  indications, usual  risks and alternatives  relative to the benefits with patient who agrees to proceed with attempt to diuresis x one week then return for cxr and R thoracentesis if needed.

## 2013-02-27 ENCOUNTER — Ambulatory Visit: Payer: BC Managed Care – PPO | Admitting: Internal Medicine

## 2013-02-27 ENCOUNTER — Telehealth: Payer: Self-pay | Admitting: Diagnostic Neuroimaging

## 2013-02-28 NOTE — Telephone Encounter (Signed)
Patient stated that he is to begin a new medication (Tecfidera) and discontinue some of his current medications.  He has approximately 2 weeks left of his current medication and has received a call from his insurance about refilling these for another month.  Please advise.

## 2013-02-28 NOTE — Telephone Encounter (Signed)
Spoke with Pt and he will come to [facility] to sign Tecfidera consent form.  Information package and form have been left at the reception desk.

## 2013-02-28 NOTE — Telephone Encounter (Signed)
Finish betaseron over next 2 weeks. Then wait 2 weeks and start tecfidera. Will start forms. -VRP

## 2013-02-28 NOTE — Telephone Encounter (Signed)
Voice message left requesting a call-back.  Pt must complete Tecfidera's patient consent form.

## 2013-03-03 ENCOUNTER — Ambulatory Visit (INDEPENDENT_AMBULATORY_CARE_PROVIDER_SITE_OTHER): Payer: BC Managed Care – PPO | Admitting: Internal Medicine

## 2013-03-03 ENCOUNTER — Ambulatory Visit (INDEPENDENT_AMBULATORY_CARE_PROVIDER_SITE_OTHER)
Admission: RE | Admit: 2013-03-03 | Discharge: 2013-03-03 | Disposition: A | Discharge status: Home / Self Care | Payer: BC Managed Care – PPO | Source: Ambulatory Visit | Attending: Internal Medicine | Admitting: Internal Medicine

## 2013-03-03 ENCOUNTER — Encounter: Payer: Self-pay | Admitting: Internal Medicine

## 2013-03-03 VITALS — BP 104/54 | HR 58 | Temp 97.6°F | Ht 68.0 in | Wt 164.0 lb

## 2013-03-03 DIAGNOSIS — J90 Pleural effusion, not elsewhere classified: Secondary | ICD-10-CM

## 2013-03-03 NOTE — Progress Notes (Signed)
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     02/24/2013 1st pulmonary eval cc worsening doe with swimming x one year indolent onset progressive and assoc with leg swelling to  groin r x with diuretics> 30 lb wt off but still sign bilatera le edema and obvious ascites. No longer limited by sob but less active (no swimming any more) since onset of sob. rec Increase fluid pill to take extra dose daily until return in one week   03/03/2013 f/u ov/Wert re pleural effusion Chief Complaint  Patient presents with  . Follow-up    Pt staes breathing no better since the last visit.    no orthopnea and not presently limited by doe. Leg swelling better on higher dose lasix  No obvious daytime variabilty or assoc chronic cough or cp or chest tightness, subjective wheeze overt sinus or hb symptoms. No unusual exp hx or h/o childhood pna/ asthma or knowledge of premature birth.     Current Medications, Allergies, Past Medical History, Past Surgical History, Family History, and Social History were reviewed in [facility] record.  ROS  The following are not active complaints unless bolded sore throat, dysphagia, dental problems, itching, sneezing,  nasal congestion or excess/ purulent secretions, ear ache,   fever, chills, sweats, unintended wt loss, pleuritic or exertional cp, hemoptysis,  orthopnea pnd or leg swelling, presyncope, palpitations, heartburn, abdominal pain and swelling slt better,  anorexia, nausea, vomiting, diarrhea  or change in bowel or urinary habits, change in stools or urine, dysuria,hematuria,  rash, arthralgias, visual complaints, headache, numbness weakness or ataxia or problems with walking or coordination,  change in mood/affect or  memory.             Objective:   Physical Exam  03/03/2013        89  Wt Readings from Last 3 Encounters:  02/24/13 168 lb (76.204 kg)  02/20/13 172 lb (78.019 kg)  02/08/13 172 lb (78.019 kg)    Very frail, needs full assist to get on exam table  HEENT: nl dentition, turbinates, and orophanx. Nl external ear canals without cough reflex   NECK :  without JVD/Nodes/TM/ nl carotid upstrokes bilaterally   LUNGS: no acc muscle use, distant bs R base with dullness only at base  CV:  RRR  no s3 or murmur or increase in P2, no sign pitting edema in LE's  now.   ABD:  soft and nontender with nl excursion in the supine position. Obvious ascites, moderate hepatomegaly with ? Liver mass medially   MS:  warm without deformities, calf tenderness, cyanosis or clubbing  SKIN: warm and dry without lesions    NEURO:  alert, approp,very awkward gait     CT 02/10/13 Stable postoperative changes consistent with a hemiarch repair. The  size of the thoracic aorta has not significantly changed since 2013  but there has been mild enlargement since 2009. The aorta now  measures up to 5.9 cm.  Right pleural effusion has enlarged. Right pleural effusion is  large.  New collapse of the right middle lobe.  Abdominal ascites. Again noted is an irregular lesion in the left  hepatic lobe.    CXR  03/03/2013 :  Stable or slightly decreased size of the small to moderate right pleural effusion. Right basilar atelectasis.     Assessment & Plan:

## 2013-03-03 NOTE — Patient Instructions (Addendum)
Continue your lasix 2 daily with option to take one if minimal fluid and 3 if more fluid > this is the best way to control the fluid in your chest and in your abdomen but will need monitoring of your kidney function and your potassium level (continue instructions regarding using one potassium pill with each lasix).  Pulmonary follow up is only needed if the fluid in the chest is worse while the leg swelling is better > if both worse this is a fluid management problem, not a pulmonary problem per se

## 2013-03-04 NOTE — Assessment & Plan Note (Signed)
His effusion is much smaller to my exam to the point where "blind" thoracentesis is not feasible and IR directed bx is not clinically indicated.  If we see increase abd swelling and sob in absence of progressive leg swelling then it would make sense to repeat the cxr with a marker to see if there is safe window to do the procedure in the office.  If still a question of etiology (which I'm fairly certain is hydrostatic since we know he has valvular heart dz) then could go ahead any time and do IR directed R thoracentesis for all the usual studies.    Each maintenance medication was reviewed in detail including most importantly the difference between maintenance and as needed and under what circumstances the prns are to be used.  Please see instructions for details which were reviewed in writing and the patient given a copy.

## 2013-03-13 ENCOUNTER — Telehealth: Payer: Self-pay | Admitting: Diagnostic Neuroimaging

## 2013-03-13 NOTE — Telephone Encounter (Signed)
When should patient start Tecfidera after receiving it this week?

## 2013-03-14 NOTE — Telephone Encounter (Signed)
Called patient and relayed information to start tecfidera 2 weeks after last dose of Betaseron.

## 2013-03-14 NOTE — Telephone Encounter (Signed)
Should start tecfidera 2 weeks after last dose of betaseron. Let patient know.   -VRP

## 2013-03-21 ENCOUNTER — Other Ambulatory Visit: Payer: Self-pay

## 2013-03-21 DIAGNOSIS — D696 Thrombocytopenia, unspecified: Secondary | ICD-10-CM

## 2013-03-21 MED ORDER — DALFAMPRIDINE ER 10 MG PO TB12: 10.0000 mg | ORAL_TABLET | Freq: Two times a day (BID) | ORAL | Status: DC

## 2013-03-27 ENCOUNTER — Encounter: Payer: Self-pay | Admitting: Cardiology

## 2013-03-27 ENCOUNTER — Ambulatory Visit (INDEPENDENT_AMBULATORY_CARE_PROVIDER_SITE_OTHER): Payer: BC Managed Care – PPO | Admitting: Cardiology

## 2013-03-27 VITALS — BP 122/54 | HR 68 | Ht 68.0 in | Wt 164.4 lb

## 2013-03-27 DIAGNOSIS — I4891 Unspecified atrial fibrillation: Secondary | ICD-10-CM

## 2013-03-27 DIAGNOSIS — R600 Localized edema: Secondary | ICD-10-CM

## 2013-03-27 DIAGNOSIS — I079 Rheumatic tricuspid valve disease, unspecified: Secondary | ICD-10-CM

## 2013-03-27 DIAGNOSIS — D696 Thrombocytopenia, unspecified: Secondary | ICD-10-CM

## 2013-03-27 DIAGNOSIS — J90 Pleural effusion, not elsewhere classified: Secondary | ICD-10-CM

## 2013-03-27 DIAGNOSIS — I071 Rheumatic tricuspid insufficiency: Secondary | ICD-10-CM

## 2013-03-27 DIAGNOSIS — I7100 Dissection of unspecified site of aorta: Secondary | ICD-10-CM

## 2013-03-27 DIAGNOSIS — R609 Edema, unspecified: Secondary | ICD-10-CM

## 2013-03-27 NOTE — Progress Notes (Signed)
03/27/2013   PCP: Criselda Peaches, MD   Chief Complaint  Patient presents with  . ROV 3 months    Shortness of breath with exertion, some swelling    Primary Cardiologist: Dr. 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 [name] and [name]  since he presented acutely on October 15, 1998 with acute type A aortic dissection. He underwent emergency surgical repair with hemi-arch replacement of the ascending thoracic aorta with resuspension of the native aortic valve. 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    The patient's functional status has been chronically limited over the years because of his long-standing battle with multiple sclerosis. His overall strength and physical mobility has slowly deteriorated over the years. In the past he was followed for by [name], but more recently he has been followed by [name] who saw him in the office last month. At that time it was noted that he may be moving into the secondary progressive phase of his disease, and the patient has been contemplating making a change in medical therapy.   The patient has a long history of hypertension with hypertensive cardiomyopathy and exertional shortness of breath. He had an echocardiogram performed in March of 2011 that revealed normal left ventricular systolic function but flattened septum consistent with a right ventricular pressure and volume overload. At that time he was noted to have mild to moderate mitral regurgitation with moderate to severe tricuspid regurgitation. The patient notes that over the last 3 years he has had a gradual progression of symptoms of mild exertional shortness of breath. He states that for the most part his breathing only bothers him when he is swimming. He used to swim  on a regular basis for exercise, but within the last few months he got to the point where he could no longer continue to swim.    In May he developed increasing shortness of breath and volume overload and was found to be in atrial fibrillation the rate controlled. His diuretics were adjusted and he gradually improved.  Last nuclear stress test was 2011 revealing no significant ischemia.   CT of the abdomen and pelvis 4/29.14 showed chronic aortic dissection that extends into the right common iliac artery. Small amount of ascites. Large right pleural effusion.  For the right pleural effusion he was sent to Dr. Melvyn Novas for further evaluation and it is decreasing in size. Dr. Melvyn Novas do not feel thoracentesis was needed.    He also underwent TEE with Dr. Sallyanne Kuster.  TEE revealed severe tricuspid insufficiency due to leaflet now coaptation and annular dilatation there was no evidence of leaflet prolapse chorioretinal rupture or vegetation.  [name] saw him back for consideration of surgery for his tricuspid regurgitation.  Dr. Patrice Paradise believes this would be exceptionally high risk and not likely to be associated with improvement in long-term survival.    Today patient actually looks much improved from my previous visit with him in May. His shortness of breath has improved he has no chest pain and only minimal lower extremity edema.   Weight at that time was 184.6 pounds and today he is at 164.6 pounds.  He is able to ambulate better than in May and he is swimming.   His multiple sclerosis is followed by [name],  His thrombocytopenia (platelets averaged 74, 63 up 109,000) is followed by [name] , his primary care is [name].   No Known Allergies  Current Outpatient Prescriptions  Medication Sig Dispense Refill  . amLODipine (NORVASC) 5 MG tablet Take 5 mg by mouth daily.        . Bisacodyl (LAXATIVE PO) as needed.      . dalfampridine (AMPYRA) 10 MG TB12 Take 1 tablet (10 mg total) by mouth  2 (two) times daily.  180 tablet  1  . DIOVAN 320 MG tablet Take 320 mg by mouth daily.       Mariane Baumgarten Calcium (STOOL SOFTENER PO) as needed.      . doxazosin (CARDURA) 4 MG tablet Take 4 mg by mouth at bedtime.        . folic acid (FOLVITE) 1 MG tablet Take 1 mg by mouth daily.        . furosemide (LASIX) 40 MG tablet Take 80 mg by mouth daily.       . metoprolol (LOPRESSOR) 100 MG tablet Take 100 mg by mouth 2 (two) times daily.        . potassium chloride (K-DUR) 10 MEQ tablet 1 tablet every time he takes lasix      . QUEtiapine (SEROQUEL) 25 MG tablet Take 25 mg by mouth at bedtime. prn       . rosuvastatin (CRESTOR) 5 MG tablet Take 5 mg by mouth daily.        . TECFIDERA 120 & 240 MG MISC Take 1 capsule by mouth as directed.       No current facility-administered medications for this visit.    Past Medical History  Diagnosis Date  . Neuromuscular disorder   . Hypertension   . Anemia   . Depression   . Multiple sclerosis   . Thrombocytopenia   . Dyslipidemia   . Aortic dissection, thoracic   . BPH (benign prostatic hyperplasia)   . Hemorrhoids   . Aortic dissection 11/05/1998    S/P emergency repair of acute type A aortic dissection with resuspension of native aortic valve  . 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 Chest pain 11/18/2009    2D Echo - EF >55%, right ventricle is moderately dilated, right atrium is severely dilated, moderate-severe tricuspid regurgitation; R/P Myoview - EF 56%, no scintigraphic evidence of inducible myocardial ischemia, EKG negative for ischemia,   . CHF (congestive heart failure) 08-29-47    2D Echo - EF 50-55%, mild-moderately dilated right ventricle, mild-moderate tricuspid valve regurgitation, moderately dilated right atrium  . Bilateral lower extremity edema   . Atrial fibrillation 01/30/2013    Atrial fibrillation    . Pleural effusion, right, large 01/30/2013  . Severe tricuspid valve regurgitation 01/30/2013    Severe tricuspid regurg by recent 2-D echo with a dilated tricuspid annulus, moderate pulmonary hypertension and biatrial enlargement   . Mitral regurgitation 02/20/2013  . Lower extremity edema 02/20/2013    Past Surgical History  Procedure Laterality Date  . Internal, external hemorrhoidectomy, general anesthesia,prone position.  2009  . Open reduction and internal fixation of right distal radius fracture using hand innovations distal radius volar locking plate.  07/2005    Dr Ninfa Linden  . Tee without cardioversion N/A 02/17/2013    Procedure: TRANSESOPHAGEAL ECHOCARDIOGRAM (TEE);  Surgeon: [name], MD;  Location: [facility];  Service:  Cardiovascular;  Laterality: N/A;  . Repair of acute type a aortic dissection with resuspension of native aortic valve  10/15/1998    Dr Roxy Manns    [id number] colds or fevers, significant decrease in weight with diuretics Skin:no rashes or ulcers HEENT:no blurred vision, no congestion CV:see HPI PUL:see HPI GI:no diarrhea constipation or melena, no indigestion GU:no hematuria, no dysuria MS:no joint pain, no claudication, difficulty moving at times  Neuro:no syncope, no lightheadedness Endo:no diabetes, no thyroid disease  PHYSICAL EXAM BP 122/54  Pulse 68  Ht 5\' 8"  (1.727 m)  Wt 164 lb 6.4 oz (74.571 kg)  BMI 25 kg/m2 General:Pleasant affect, though stoic, NAD Skin:Warm and dry, brisk capillary refill HEENT:normocephalic, sclera clear, mucus membranes moist Neck:supple, no JVD, no bruits  Heart:irreg irreg with systolic murmur, [place], rub or click Lungs:clear without rales, rhonchi, or wheezes [id number], non tender, + BS, do not palpate liver spleen or masses Ext:tr-1+ lower ext edema,  Neuro:alert and oriented, MAE, follows commands, + facial symmetry  EKG: Atrial fibrillation with slow ventricular response heart rate 56 but no acute  changes otherwise  ASSESSMENT AND PLAN Atrial fibrillation Will ask Dr. [name] to discuss with [name] and Dr. Earlie Server and [name] the risk of anticoagulation. Today he is in atrial fibrillation rate controlled 56.  TR (tricuspid regurgitation) TR in combination of Atrial fib, led to his volume overload CHF.  Much improved at this point.  No need for thoracentesis at this time followed by Dr. Melvyn Novas. L and did not feel surgery was indicated at this time.  Lower extremity edema Continue Lasix at current dose.  Aortic dissection Followed by [name] with history of repair of acute type A aortic dissection with resuspension of native aortic valve  Pleural effusion, right, large Followed by Dr. Melvyn Novas, this decreasing on last chest x-ray  Thrombocytopenia  Followed by Dr. Earlie Server   Patient will follow with Dr. [name] in 4 weeks.  In the meantime we will discuss risk/benefit for anticaogulation

## 2013-03-27 NOTE — Assessment & Plan Note (Addendum)
Will ask Dr. [name] to discuss with [name] and Dr. Earlie Server and [name] the risk of anticoagulation. Today he is in atrial fibrillation rate controlled 56.

## 2013-03-27 NOTE — Assessment & Plan Note (Signed)
Followed by [name] with history of repair of acute type A aortic dissection with resuspension of native aortic valve

## 2013-03-27 NOTE — Assessment & Plan Note (Addendum)
TR in combination of Atrial fib, led to his volume overload CHF.  Much improved at this point.  No need for thoracentesis at this time followed by Dr. Melvyn Novas. L and did not feel surgery was indicated at this time.

## 2013-03-27 NOTE — Assessment & Plan Note (Signed)
Continue Lasix at current dose.

## 2013-03-27 NOTE — Assessment & Plan Note (Signed)
Followed by Dr. Mohammed 

## 2013-03-27 NOTE — Assessment & Plan Note (Signed)
Followed by Dr. Melvyn Novas, this decreasing on last chest x-ray

## 2013-03-27 NOTE — Patient Instructions (Addendum)
Continue medications that you're currently on.   I will asked Dr. [name] to discuss with [name] anticoagulation issues they may also need to discuss with [name].  Follow up with Dr. [name] in 4 weeks.

## 2013-03-29 ENCOUNTER — Telehealth: Payer: Self-pay | Admitting: Cardiovascular Disease

## 2013-03-29 NOTE — Telephone Encounter (Signed)
[name],Please be sure Dr [name] call Dr [name] tomorrow  morning-concerning [name] please!

## 2013-03-31 NOTE — Telephone Encounter (Signed)
Message sent to Dr [name] to call

## 2013-03-31 NOTE — Telephone Encounter (Signed)
Dr [name] spoke with Dr [name]

## 2013-04-04 ENCOUNTER — Telehealth: Payer: Self-pay | Admitting: Cardiovascular Disease

## 2013-04-04 NOTE — Telephone Encounter (Signed)
Start coumadin A/c for his CAF

## 2013-04-04 NOTE — Telephone Encounter (Signed)
He wanted Dr [name] to know he talked to Dr Green-Dr [name] wanted him to call Dr [name] and tell him to precribe a blood thinner for him.Please call him and let him know what Dr [name] decides.

## 2013-04-04 NOTE — Telephone Encounter (Signed)
Please advise 

## 2013-04-05 ENCOUNTER — Telehealth: Payer: Self-pay | Admitting: Cardiovascular Disease

## 2013-04-05 MED ORDER — WARFARIN SODIUM 5 MG PO TABS: 5.0000 mg | ORAL_TABLET | Freq: Every day | ORAL | Status: DC

## 2013-04-05 NOTE — Telephone Encounter (Signed)
Mr. Kosior returned your call.

## 2013-04-05 NOTE — Telephone Encounter (Signed)
I spoke with patient and advised that Dr [name] wants to start Coumadin.  I spoke with Claiborne Billings D and the dose is 5mg  daily with an INR check next Tues 7/29.  Pt aware of medication and testing next week.

## 2013-04-11 ENCOUNTER — Ambulatory Visit (INDEPENDENT_AMBULATORY_CARE_PROVIDER_SITE_OTHER): Payer: BC Managed Care – PPO | Admitting: Pharmacist Clinician (PhC)/ Clinical Pharmacy Specialist

## 2013-04-11 DIAGNOSIS — I4891 Unspecified atrial fibrillation: Secondary | ICD-10-CM

## 2013-04-11 DIAGNOSIS — Z7901 Long term (current) use of anticoagulants: Secondary | ICD-10-CM

## 2013-04-11 LAB — PROTIME-INR
INR: 5 — ABNORMAL HIGH (ref <1.50)
Prothrombin Time: 43.5 seconds — ABNORMAL HIGH (ref 11.6–15.2)

## 2013-04-11 LAB — POCT INR: INR: 6.9

## 2013-04-12 ENCOUNTER — Telehealth: Payer: Self-pay | Admitting: Pharmacist Clinician (PhC)/ Clinical Pharmacy Specialist

## 2013-04-12 NOTE — Telephone Encounter (Signed)
Said he was waiting  To hear results from his Coumadin-please call.

## 2013-04-13 NOTE — Telephone Encounter (Signed)
Spoke with pt.  INR results given.  See anti-coag note from 7/29 for dosing details.

## 2013-04-17 ENCOUNTER — Ambulatory Visit (INDEPENDENT_AMBULATORY_CARE_PROVIDER_SITE_OTHER): Payer: BC Managed Care – PPO | Admitting: Pharmacist Clinician (PhC)/ Clinical Pharmacy Specialist

## 2013-04-17 DIAGNOSIS — I4891 Unspecified atrial fibrillation: Secondary | ICD-10-CM

## 2013-04-17 DIAGNOSIS — Z7901 Long term (current) use of anticoagulants: Secondary | ICD-10-CM

## 2013-04-17 LAB — POCT INR: INR: 3.7

## 2013-04-17 MED ORDER — WARFARIN SODIUM 2 MG PO TABS: 2.0000 mg | ORAL_TABLET | Freq: Every day | ORAL | Status: DC

## 2013-04-24 ENCOUNTER — Ambulatory Visit (INDEPENDENT_AMBULATORY_CARE_PROVIDER_SITE_OTHER): Payer: BC Managed Care – PPO | Admitting: Pharmacist Clinician (PhC)/ Clinical Pharmacy Specialist

## 2013-04-24 DIAGNOSIS — I4891 Unspecified atrial fibrillation: Secondary | ICD-10-CM

## 2013-04-24 DIAGNOSIS — Z7901 Long term (current) use of anticoagulants: Secondary | ICD-10-CM

## 2013-04-24 LAB — POCT INR: INR: 1.7

## 2013-05-01 ENCOUNTER — Encounter: Payer: Self-pay | Admitting: Cardiovascular Disease

## 2013-05-01 ENCOUNTER — Ambulatory Visit (INDEPENDENT_AMBULATORY_CARE_PROVIDER_SITE_OTHER): Payer: Medicare Other | Admitting: Cardiovascular Disease

## 2013-05-01 ENCOUNTER — Ambulatory Visit (INDEPENDENT_AMBULATORY_CARE_PROVIDER_SITE_OTHER): Payer: Medicare Other | Admitting: Pharmacist Clinician (PhC)/ Clinical Pharmacy Specialist

## 2013-05-01 VITALS — BP 120/68 | HR 67 | Ht 68.0 in | Wt 166.3 lb

## 2013-05-01 DIAGNOSIS — I071 Rheumatic tricuspid insufficiency: Secondary | ICD-10-CM

## 2013-05-01 DIAGNOSIS — R0989 Other specified symptoms and signs involving the circulatory and respiratory systems: Secondary | ICD-10-CM

## 2013-05-01 DIAGNOSIS — Z7901 Long term (current) use of anticoagulants: Secondary | ICD-10-CM

## 2013-05-01 DIAGNOSIS — I4891 Unspecified atrial fibrillation: Secondary | ICD-10-CM

## 2013-05-01 DIAGNOSIS — I079 Rheumatic tricuspid valve disease, unspecified: Secondary | ICD-10-CM

## 2013-05-01 LAB — POCT INR: INR: 1.8

## 2013-05-01 NOTE — Assessment & Plan Note (Signed)
He was begun on Coumadin anticoagulation because of his A. Fib for stroke prophylaxis. His INRs are followed here in our office

## 2013-05-01 NOTE — Assessment & Plan Note (Signed)
He is on furosemide with 1+ peripheral edema. He was seen by Dr. Zenia Resides who felt that he was not a surgical candidate for tricuspid annuloplasty and/or replacement.

## 2013-05-01 NOTE — Patient Instructions (Addendum)
  We will see you back in follow up in 6 months with Dr [name]  Dr [name] has ordered a carotid doppler Your physician has requested that you have a carotid duplex. This test is an ultrasound of the carotid arteries in your neck. It looks at blood flow through these arteries that supply the brain with blood. Allow one hour for this exam. There are no restrictions or special instructions.

## 2013-05-01 NOTE — Progress Notes (Signed)
05/01/2013 [name]   1945/12/03  [id number]  Primary Physician GREEN, [name], MD Primary Cardiologist: [name] MD [place], [place]   HPI:  [name] is a 67 y.o. male history of hemispheric repair of type A aortic dissection with resuspension of the native aortic valve in February 2000 Dr. Ricard Dillon, chronic aortic dissection extends from the descending, multiple sclerosis  visualized thoracic aorta into the right common iliac artery, dyslipidemia, hypertension, anemia. Patient's last 2-D echocardiogram was 01/17/2013 revealed an ejection fraction of 50-55%.. Wall motion was normal. Uric valve surgery with a mild central regurgitation. Aortic root was dilated ST junction. Mild to moderate mitral valve regurgitation. Left H. and was severely dilated 35.5 cm. The right ventricle cavity size mildly dilated. Right atrium was severely dilated at 32.5 cm tricuspid valve showed severe regurgitation with reversal of flow in the hepatic veins. Dilated tricuspid annulus measuring 6.16 cm. The pulmonary pressure was 44 mmHg. Patient's last nuclear stress test was in 2011 and showed no significant ischemia was considered low risk. CT of the abdomen and pelvis 4/29.14 showed chronic aortic dissection that extends into the right common iliac artery. Small amount of ascites. Large right pleural effusion. Diffuse subcutaneous edema. These findings may be on the basis of elevated right heart filling pressures supported by cardiomegaly. The patient presents for evaluation. He reports the first signs signs of dyspnea on exertion emotion or edema approximately 2 years ago but recently has gotten severely more traumatic. Reports increased lower extremity edema and dyspnea on exertion. He usually swims but as of one month ago he had to stop swimming because of increased shortness of breath and fatigue. He denies orthopnea, PND, chest pain, nausea, vomiting, fever, palpitations. He does report  dizziness when stooping over on occasion. EKG done in our office shows atrial fibrillation with controlled ventricular rate, 62 beats per minute. [name] has adjusted his diuretics which has resulted in moderate improvement in his lower gemmae edema. He remains in atrial fibrillation with a controlled ventricular response. I believe his dyspnea and edema are related to a combination of his severe TR along with his A. Fib. He is a candidate for Coumadin anticoagulation for stroke prophylaxis which has since been started and followed here in our office. He saw Dr. Dorian Pod for consideration of tricuspid valve annuloplasty but thought to be too high risk for surgical intervention. He is on low-dose diuretic with minimal peripheral edema. He does have multiple sclerosis and is minimally ambulatory and spent most of his day in the chair although he does swim every other day.    Current Outpatient Prescriptions  Medication Sig Dispense Refill  . amLODipine (NORVASC) 5 MG tablet Take 5 mg by mouth daily.        . Bisacodyl (LAXATIVE PO) as needed.      . dalfampridine (AMPYRA) 10 MG TB12 Take 1 tablet (10 mg total) by mouth 2 (two) times daily.  180 tablet  1  . DIOVAN 320 MG tablet Take 320 mg by mouth daily.       Mariane Baumgarten Calcium (STOOL SOFTENER PO) as needed.      . doxazosin (CARDURA) 4 MG tablet Take 4 mg by mouth at bedtime.        . folic acid (FOLVITE) 1 MG tablet Take 1 mg by mouth daily.        . furosemide (LASIX) 40 MG tablet Take 80 mg by mouth daily.       Marland Kitchen  metoprolol (LOPRESSOR) 100 MG tablet Take 100 mg by mouth 2 (two) times daily.        . potassium chloride (K-DUR) 10 MEQ tablet 1 tablet every time he takes lasix      . QUEtiapine (SEROQUEL) 25 MG tablet Take 25 mg by mouth at bedtime as needed.       . rosuvastatin (CRESTOR) 5 MG tablet Take 5 mg by mouth daily.        . TECFIDERA 120 & 240 MG MISC Take 1 capsule by mouth as directed.      . warfarin (COUMADIN) 2 MG tablet Take 1  tablet (2 mg total) by mouth daily.  30 tablet  3   No current facility-administered medications for this visit.    No Known Allergies  History   Social History  . Marital Status: Married    Spouse Name: N/A    Number of Children: N/A  . Years of Education: N/A   Occupational History  . Retired Professor Retired   Social History Main Topics  . Smoking status: Never Smoker   . Smokeless tobacco: Never Used  . Alcohol Use: Yes     Comment: occasionally  . Drug Use: No  . Sexual Activity: Not on file   Other Topics Concern  . Not on file   Social History Narrative  . No narrative on file     Review of Systems: General: negative for chills, fever, night sweats or weight changes.  Cardiovascular: negative for chest pain, dyspnea on exertion, edema, orthopnea, palpitations, paroxysmal nocturnal dyspnea or shortness of breath Dermatological: negative for rash Respiratory: negative for cough or wheezing Urologic: negative for hematuria Abdominal: negative for nausea, vomiting, diarrhea, bright red blood per rectum, melena, or hematemesis Neurologic: negative for visual changes, syncope, or dizziness All other systems reviewed and are otherwise negative except as noted above.    Blood pressure 120/68, pulse 67, height 5\' 8"  (1.727 m), weight 166 lb 4.8 oz (75.433 kg).  General appearance: alert and no distress Neck: no adenopathy, no JVD, supple, symmetrical, trachea midline, thyroid not enlarged, symmetric, no tenderness/mass/nodules and soft right carotid bruit Lungs: clear to auscultation bilaterally Heart: irregularly irregular rhythm Extremities: extremities normal, atraumatic, no cyanosis or edema and 1+ peripheral edema  EKG atrial fibrillation with ventricular response of 67 and incomplete right bundle branch block  ASSESSMENT AND PLAN:   Atrial fibrillation He was begun on Coumadin anticoagulation because of his A. Fib for stroke prophylaxis. His INRs are  followed here in our office  Severe tricuspid valve regurgitation He is on furosemide with 1+ peripheral edema. He was seen by Dr. Zenia Resides who felt that he was not a surgical candidate for tricuspid annuloplasty and/or replacement.      [name] MD FACP,FACC,FAHA, [facility] 05/01/2013 2:48 PM

## 2013-05-05 ENCOUNTER — Encounter (HOSPITAL_COMMUNITY): Payer: BC Managed Care – PPO

## 2013-05-10 ENCOUNTER — Encounter (HOSPITAL_COMMUNITY): Payer: BC Managed Care – PPO

## 2013-05-10 ENCOUNTER — Ambulatory Visit (INDEPENDENT_AMBULATORY_CARE_PROVIDER_SITE_OTHER): Payer: Medicare Other | Admitting: Pharmacist Clinician (PhC)/ Clinical Pharmacy Specialist

## 2013-05-10 ENCOUNTER — Ambulatory Visit: Payer: Medicare Other | Admitting: Pharmacist Clinician (PhC)/ Clinical Pharmacy Specialist

## 2013-05-10 ENCOUNTER — Ambulatory Visit (HOSPITAL_COMMUNITY)
Admission: RE | Admit: 2013-05-10 | Discharge: 2013-05-10 | Disposition: A | Discharge status: Home / Self Care | Payer: Medicare Other | Source: Ambulatory Visit | Attending: Cardiovascular Disease | Admitting: Cardiovascular Disease

## 2013-05-10 VITALS — BP 112/56 | HR 64

## 2013-05-10 DIAGNOSIS — R0989 Other specified symptoms and signs involving the circulatory and respiratory systems: Secondary | ICD-10-CM | POA: Insufficient documentation

## 2013-05-10 DIAGNOSIS — I4891 Unspecified atrial fibrillation: Secondary | ICD-10-CM

## 2013-05-10 DIAGNOSIS — Z7901 Long term (current) use of anticoagulants: Secondary | ICD-10-CM

## 2013-05-10 LAB — POCT INR: INR: 2.7

## 2013-05-10 NOTE — Progress Notes (Signed)
Carotid Duplex Completed. [name], BS, RDMS, RVT  

## 2013-05-16 ENCOUNTER — Telehealth: Payer: Self-pay | Admitting: Cardiovascular Disease

## 2013-05-16 NOTE — Telephone Encounter (Signed)
I spoke with patient.  I will have Dr [name] review the dopplers and call him with the results.

## 2013-05-16 NOTE — Telephone Encounter (Signed)
Forwarded to kathryn 

## 2013-05-16 NOTE — Telephone Encounter (Signed)
Dr [name] has not reviewed dopplers yet.

## 2013-05-16 NOTE — Telephone Encounter (Signed)
Would like to know the results of his last test he had .Marland KitchenPlease Call   Thanks

## 2013-05-17 NOTE — Telephone Encounter (Signed)
Please call-concerning conversation you had yesterday.

## 2013-05-17 NOTE — Telephone Encounter (Signed)
Dr [name] reviewed carotid dopplers.  They look good.  He doesn't need to come in tomorrow for office visit.

## 2013-05-18 ENCOUNTER — Ambulatory Visit: Payer: BC Managed Care – PPO | Admitting: Cardiovascular Disease

## 2013-05-20 DIAGNOSIS — I369 Nonrheumatic tricuspid valve disorder, unspecified: Secondary | ICD-10-CM

## 2013-05-24 ENCOUNTER — Ambulatory Visit (INDEPENDENT_AMBULATORY_CARE_PROVIDER_SITE_OTHER): Payer: BC Managed Care – PPO | Admitting: Pharmacist Clinician (PhC)/ Clinical Pharmacy Specialist

## 2013-05-24 VITALS — BP 110/56 | HR 80

## 2013-05-24 DIAGNOSIS — I4891 Unspecified atrial fibrillation: Secondary | ICD-10-CM

## 2013-05-24 DIAGNOSIS — Z7901 Long term (current) use of anticoagulants: Secondary | ICD-10-CM

## 2013-05-24 LAB — POCT INR: INR: 2.7

## 2013-06-21 ENCOUNTER — Telehealth: Payer: Self-pay | Admitting: Diagnostic Neuroimaging

## 2013-06-21 ENCOUNTER — Ambulatory Visit: Payer: Medicare Other | Admitting: Pharmacist Clinician (PhC)/ Clinical Pharmacy Specialist

## 2013-06-22 NOTE — Telephone Encounter (Signed)
Spoked to patient. He requested to schedule his f/u appt. Scheduled 6 month f/u.

## 2013-06-22 NOTE — Telephone Encounter (Signed)
Called patient. Spouse says he was not available at the moment. Will have patient to return call.

## 2013-07-05 ENCOUNTER — Ambulatory Visit (INDEPENDENT_AMBULATORY_CARE_PROVIDER_SITE_OTHER): Payer: Medicare Other | Admitting: Pharmacist Clinician (PhC)/ Clinical Pharmacy Specialist

## 2013-07-05 VITALS — BP 108/60 | HR 64

## 2013-07-05 DIAGNOSIS — I4891 Unspecified atrial fibrillation: Secondary | ICD-10-CM

## 2013-07-05 DIAGNOSIS — Z7901 Long term (current) use of anticoagulants: Secondary | ICD-10-CM

## 2013-07-05 LAB — POCT INR: INR: 5.1

## 2013-07-17 ENCOUNTER — Ambulatory Visit (INDEPENDENT_AMBULATORY_CARE_PROVIDER_SITE_OTHER): Payer: Medicare Other | Admitting: Pharmacist Clinician (PhC)/ Clinical Pharmacy Specialist

## 2013-07-17 VITALS — BP 100/60 | HR 72

## 2013-07-17 DIAGNOSIS — Z7901 Long term (current) use of anticoagulants: Secondary | ICD-10-CM

## 2013-07-17 DIAGNOSIS — I4891 Unspecified atrial fibrillation: Secondary | ICD-10-CM

## 2013-07-17 LAB — POCT INR: INR: 3.2

## 2013-07-19 ENCOUNTER — Encounter (INDEPENDENT_AMBULATORY_CARE_PROVIDER_SITE_OTHER): Payer: Self-pay

## 2013-07-19 ENCOUNTER — Ambulatory Visit (INDEPENDENT_AMBULATORY_CARE_PROVIDER_SITE_OTHER): Payer: Medicare Other | Admitting: Diagnostic Neuroimaging

## 2013-07-19 ENCOUNTER — Encounter: Payer: Self-pay | Admitting: Diagnostic Neuroimaging

## 2013-07-19 VITALS — BP 106/57 | HR 60 | Temp 97.9°F | Ht 68.0 in | Wt 182.5 lb

## 2013-07-19 DIAGNOSIS — Z630 Problems in relationship with spouse or partner: Secondary | ICD-10-CM

## 2013-07-19 DIAGNOSIS — F329 Major depressive disorder, single episode, unspecified: Secondary | ICD-10-CM

## 2013-07-19 DIAGNOSIS — F32A Depression, unspecified: Secondary | ICD-10-CM

## 2013-07-19 DIAGNOSIS — G35 Multiple sclerosis: Secondary | ICD-10-CM

## 2013-07-19 LAB — CBC WITH DIFFERENTIAL
Basophils Absolute: 0 10*3/uL (ref 0.0–0.2)
Basos: 1 %
Eos: 6 %
Eosinophils Absolute: 0.3 10*3/uL (ref 0.0–0.4)
HCT: 29 % — ABNORMAL LOW (ref 37.5–51.0)
Hemoglobin: 9.9 g/dL — ABNORMAL LOW (ref 12.6–17.7)
Lymphocytes Absolute: 0.7 10*3/uL (ref 0.7–3.1)
Lymphs: 14 %
MCH: 27.3 pg (ref 26.6–33.0)
MCHC: 34.1 g/dL (ref 31.5–35.7)
MCV: 80 fL (ref 79–97)
Monocytes Absolute: 0.6 10*3/uL (ref 0.1–0.9)
Monocytes: 13 %
Neutrophils Absolute: 3.3 10*3/uL (ref 1.4–7.0)
Neutrophils Relative %: 66 %
Platelets: 80 10*3/uL — CL (ref 150–379)
RBC: 3.63 x10E6/uL — ABNORMAL LOW (ref 4.14–5.80)
RDW: 13.7 % (ref 12.3–15.4)
WBC: 4.9 10*3/uL (ref 3.4–10.8)

## 2013-07-19 NOTE — Progress Notes (Signed)
[facility]  PATIENT: [name] DOB: 1946-06-26   HISTORICAL  CHIEF COMPLAINT:  Chief Complaint  Patient presents with  . Follow-up    MS    HISTORY OF PRESENT ILLNESS:   UPDATE 07/19/13: Patient continues to have progressive deterioration in cognitive ability, memory, mood lability, gait stability. Patient is on tecfidera now, and tolerating without side effects. Patient feels when he came off of Betaseron he had some accelerated progression of MS that this has slowed down since starting tecfidera. Unclear whether patient's current level of functioning is on the same trajectory as the decline that was noted while patient was on Betaseron, or whether there has been some acceleration of deterioration.  Other factors include some marital discord and comorbid depression.   Other contributing factors include his cardiac issues including atrial fibrillation, on Coumadin, as well as severe tricuspid valve regurgitation leading to peripheral edema. Unfortunately patient is not felt to be a good surgical candidate.  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  The patient is accompanied by his wife.  Pt. States he is doing worse since his last visit in Feb.  He is walking slower and having more difficulty standing from a seated position.  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  [name] had talked to him about changing MS medications and he thinks he wants to. Having more memory problems. Continuing on betaseron right now.  PRIOR HPI (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 with a history of multiple sclerosis diagnosed in September 1996.  He has been on Betaseron since 1996 and has right leg spasticity requiring him to use a cane in his left hand.He does not have any significant side  effects from the Betaseron. Blood studies 02/10/2012 are normal CBC and CMP except for a low platelet count of 63K, Hgb 12.1 and alk Phos of 253. This is being followed by [name] and [name]. He has never had lymphopenia or neutropenia. There is no change in his symptoms.  He denies bowel or bladder incontinence, weakness, Lhermitte's sign, or double vision. He swims 3 times per week for 10 laps which is a little over a quarter of a mile, claims he is winded afterwards. He has not fallen, uses a  cane in his left hand.  MRI of the  brain and cervical spine with and without contrast enhancement 03/26/2010 showed multiple subcortical, periventricular, and  brainstem white matter hyperintensities without enhancing lesions present. There were T1 black holes and atrophy present. The cervical MRI showed a large central disc protrusion at C5-6 and spinal cord hyperintensities at C4 and brain stem representing remote demyelinating plaques without enhancing lesions present.  Ampyra was started November 2012 with improvement in gait. He was pleased with the addition of the drug and his response. Pt denies injection site problems however he says he is tired of shots. He has been on [employer] since 1996 and we have talked about whether changing to an oral agent should be considered. He  had several falls with his right leg giving way on standing. He has right knee pain. He states his memory is worse. He denies numbness delusions or depression. 07/11/2012= (MMSE29/30. Clock drawing task4/4. Animal fluency test 17).  His wife has noted more problems walking which the patient is hesitant to  discuss. He  does admit since calling me for a  work in appointment , that he believes he is having more difficulty walking. He uses a walker at home and is noticeably slower. He uses  the walker to get out of a bed that is low and out of a chair. He notices shortness of breath on swimming. His swallowing is okay except for occasional  difficulty with liquids. He has right knee pain when walking. Betaseron seems to help his knee pain, walking, and his eyes  after an injection. He is hesitant to go off of the  medication for 3 weeks. He has increased spasms in his right foot and leg that are also moving to the left leg. He has swelling in his legs treated with furosemide.  REVIEW OF SYSTEMS: Full 14 system review of systems performed and notable only for weakness and fatigue.  ALLERGIES: No Known Allergies  HOME MEDICATIONS: Outpatient Prescriptions Prior to Visit  Medication Sig Dispense Refill  . amLODipine (NORVASC) 5 MG tablet Take 5 mg by mouth daily.        . Bisacodyl (LAXATIVE PO) as needed.      . dalfampridine (AMPYRA) 10 MG TB12 Take 1 tablet (10 mg total) by mouth 2 (two) times daily.  180 tablet  1  . DIOVAN 320 MG tablet Take 320 mg by mouth daily.       Mariane Baumgarten Calcium (STOOL SOFTENER PO) as needed.      . doxazosin (CARDURA) 4 MG tablet Take 4 mg by mouth at bedtime.        . folic acid (FOLVITE) 1 MG tablet Take 1 mg by mouth daily.        . furosemide (LASIX) 40 MG tablet Take 80 mg by mouth daily. Or 120mg  prn      . metoprolol (LOPRESSOR) 100 MG tablet Take 100 mg by mouth 2 (two) times daily.        . potassium chloride (K-DUR) 10 MEQ tablet 1 tablet every time he takes lasix      . QUEtiapine (SEROQUEL) 25 MG tablet Take 25 mg by mouth at bedtime as needed.       . rosuvastatin (CRESTOR) 5 MG tablet Take 5 mg by mouth daily.        . TECFIDERA 120 & 240 MG MISC Take 1 capsule by mouth as directed.      . warfarin (COUMADIN) 2 MG tablet Take 1 tablet (2 mg total) by mouth daily.  30 tablet  3   No facility-administered medications prior to visit.    PAST MEDICAL HISTORY: Past Medical History  Diagnosis Date  . Neuromuscular disorder   . Hypertension   . Anemia   . Depression   . Multiple sclerosis   . Thrombocytopenia   . Dyslipidemia   . Aortic dissection, thoracic   . BPH (benign  prostatic hyperplasia)   . Hemorrhoids   . Aortic dissection 11/05/1998    S/P emergency repair of acute type A aortic dissection with resuspension of native aortic valve  . 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 Chest pain 11/18/2009    2D Echo - EF >55%, right ventricle is moderately dilated, right atrium is severely dilated, moderate-severe tricuspid regurgitation; R/P Myoview - EF 56%, no scintigraphic evidence of inducible myocardial ischemia, EKG negative for ischemia,   . CHF (congestive heart failure) September 16, 1946  2D Echo - EF 50-55%, mild-moderately dilated right ventricle, mild-moderate tricuspid valve regurgitation, moderately dilated right atrium  . Bilateral lower extremity edema   . Atrial fibrillation 01/30/2013    Atrial fibrillation   . Pleural effusion, right, large 01/30/2013  . Severe tricuspid valve regurgitation 01/30/2013    Severe tricuspid regurg by recent 2-D echo with a dilated tricuspid annulus, moderate pulmonary hypertension and biatrial enlargement   . Mitral regurgitation 02/20/2013  . Lower extremity edema 02/20/2013    PAST SURGICAL HISTORY: Past Surgical History  Procedure Laterality Date  . Internal, external hemorrhoidectomy, general anesthesia,prone position.  2009  . Open reduction and internal fixation of right distal radius fracture using hand innovations distal radius volar locking plate.  07/2005    Dr Ninfa Linden  . Tee without cardioversion N/A 02/17/2013    Procedure: TRANSESOPHAGEAL ECHOCARDIOGRAM (TEE);  Surgeon: [name], MD;  Location: [facility] ENDOSCOPY;  Service: Cardiovascular;  Laterality: N/A;  . Repair of acute type a aortic dissection with resuspension of native aortic valve  10/15/1998    Dr Roxy Manns    FAMILY HISTORY: Family History  Problem Relation Age of Onset  . Thyroid cancer Father     smoked  . Lung cancer  Brother     smoked  . Heart attack Mother   . Emphysema Brother     smoked    SOCIAL HISTORY:  History   Social History  . Marital Status: Married    Spouse Name: Debbie    Number of Children: 2  . Years of Education: Ph.D   Occupational History  . Retired Professor Retired    Economist   Social History Main Topics  . Smoking status: Never Smoker   . Smokeless tobacco: Never Used  . Alcohol Use: Yes     Comment: occasionally  . Drug Use: No  . Sexual Activity: Not on file   Other Topics Concern  . Not on file   Social History Narrative   Patient lives at home with spouse.   Caffeine Use: eat a lot of chocolate, sodas, coffee and tea occasionally     PHYSICAL EXAM  Filed Vitals:   07/19/13 1343  BP: 106/57  Pulse: 60  Temp: 97.9 F (36.6 C)  TempSrc: Oral  Height: 5\' 8"  (1.727 m)  Weight: 182 lb 8 oz (82.781 kg)   Body mass index is 27.76 kg/(m^2).  GENERAL EXAM: Patient is in no distress  CARDIOVASCULAR: Regular rate and rhythm, no murmurs, no carotid bruits  NEUROLOGIC: MENTAL STATUS: awake, alert, language fluent, comprehension intact, naming intact CRANIAL NERVE: no papilledema on fundoscopic exam, pupils equal and reactive to light, visual fields full to confrontation, extraocular muscles SHOW SACCADIC BREAKDOWN OF SMOOTH PURSUIT; END GAZE NYSTAGMUS; PAST POINTING ON SACCADES. Facial sensation and WITH DECR RIGHT EYE CLOSURE WEAKNESS AND RIGHT LOWER FACIAL STRENGTH. Uvula midline, shoulder shrug symmetric, tongue midline. MOTOR: INCREASED TONE IN BUE AND BLE (RIGHT > LEFT). RIGHT LEG WEAKNESS (HF 3, KE 4, KF 3, DF 3).  SENSORY: DECR IN RLE TO ALL MODALITIES. COORDINATION: finger-nose-finger, fine finger movements normal REFLEXES: BUE 3 (R>L), RLE 3, LLE 2.  GAIT/STATION: DIFF STANDING FROM CHAIR. USES A CANE. SPASTIC GAIT. RIGHT CIRCUMDUCTION. UNSTEADY.   DIAGNOSTIC DATA (LABS, IMAGING, TESTING) - I reviewed patient records, labs, notes,  testing and imaging myself where available.  Lab Results  Component Value Date   WBC 3.8* 02/13/2013   HGB 10.5* 02/13/2013   HCT 32.2* 02/13/2013   MCV 80.3 02/13/2013  PLT 82* 02/13/2013      Component Value Date/Time   NA 142 02/13/2013 1400   K 3.7 02/13/2013 1400   CL 103 02/13/2013 1400   CO2 26 02/13/2013 1400   GLUCOSE 109* 02/13/2013 1400   BUN 25* 02/13/2013 1400   CREATININE 1.24 02/13/2013 1400   CREATININE 1.20 02/10/2012 1110   CALCIUM 9.5 02/13/2013 1400   PROT 6.4 02/10/2012 1110   ALBUMIN 4.0 02/10/2012 1110   AST 36 02/10/2012 1110   ALT 33 02/10/2012 1110   ALKPHOS 253* 02/10/2012 1110   BILITOT 0.9 02/10/2012 1110   GFRNONAA >60 02/15/2008 0454   GFRAA  Value: >60        The eGFR has been calculated using the MDRD equation. This calculation has not been validated in all clinical 02/15/2008 0454   No results found for this basename: CHOL,  HDL,  LDLCALC,  LDLDIRECT,  TRIG,  CHOLHDL   No results found for this basename: HGBA1C   Lab Results  Component Value Date   VITAMINB12 224 01/12/2008   Lab Results  Component Value Date   TSH 0.849 *Test methodology is 3rd generation TSH* 01/09/2008   11/16/12 MRI brain - There are multiple periventricular and subcortical and pontine chronic demyelinating plaques. There are several T1 black holes. Mild diffuse atrophy. No acute plaques. In comparison to MRI from 03/26/10, there is no significant change.  11/16/12 MRI cervical spine - Multiple chronic demyelinating plaques at C2-3, C3-4 and C5-6.  No acute plaques. At C5-6: Disc bulging with facet hypertrophy with moderate spinal stenosis, severe right and moderate left foraminal stenosis. At C3-4, C4-5: Disc bulging with mild spinal stenosis and biforaminal stenosis. In comparison  to MRI from 03/26/10 there is no significant change.  01/20/13 JCV antibody - positive, index 3.37 (H)   ASSESSMENT AND PLAN  67 y.o. year old male  has a past medical history of Neuromuscular disorder; Hypertension; Anemia;  Depression; Multiple sclerosis; Thrombocytopenia; Dyslipidemia; Aortic dissection, thoracic; BPH (benign prostatic hyperplasia); Hemorrhoids; Aortic dissection (11/05/1998); Aneurysm of aortic arch (04/04/2012); Chest pain (11/18/2009); CHF (congestive heart failure) (1946/11/12); Bilateral lower extremity edema; Atrial fibrillation (01/30/2013); Pleural effusion, right, large (01/30/2013); Severe tricuspid valve regurgitation (01/30/2013); Mitral regurgitation (02/20/2013); and Lower extremity edema (02/20/2013). here with multiple sclerosis since 1996. Tolerating tecfidera, but continues to have progressive cognitive, emotional and physical decline. May be related to secondary progressive MS, cardiac issues, depression or a combination. This is a very difficult situation.   I will repeat MRI of the brain and check CBC. I will refer patient to psychiatry and physical therapy. I think patient and his wife would benefit from seeing a marriage counselor. Patient and wife agree with plan.  PLAN: Orders Placed This Encounter  Procedures  . MR Brain W Wo Contrast  . CBC With differential/Platelet  . Ambulatory referral to Psychiatry  . Ambulatory referral to Physical Therapy   Return in about 3 months (around 10/19/2013) for with Charlott Holler or Penumalli.   Penni Bombard, MD [id number], 0000000 PM Certified in Neurology, Neurophysiology and Neuroimaging  [facility] [address], [place],  [address] [phone]

## 2013-07-19 NOTE — Patient Instructions (Signed)
I will check MRI.  I will refer you to psychiatry and physical therapy.

## 2013-08-02 ENCOUNTER — Ambulatory Visit
Admission: RE | Admit: 2013-08-02 | Discharge: 2013-08-02 | Disposition: A | Discharge status: Home / Self Care | Payer: Medicare Other | Source: Ambulatory Visit | Attending: Diagnostic Neuroimaging | Admitting: Diagnostic Neuroimaging

## 2013-08-02 DIAGNOSIS — F329 Major depressive disorder, single episode, unspecified: Secondary | ICD-10-CM

## 2013-08-02 DIAGNOSIS — G35 Multiple sclerosis: Secondary | ICD-10-CM

## 2013-08-02 DIAGNOSIS — Z630 Problems in relationship with spouse or partner: Secondary | ICD-10-CM

## 2013-08-02 DIAGNOSIS — F32A Depression, unspecified: Secondary | ICD-10-CM

## 2013-08-02 MED ORDER — GADOBENATE DIMEGLUMINE 529 MG/ML IV SOLN
8.0000 mL | Freq: Once | INTRAVENOUS | Status: AC | PRN
  Administered 2013-08-02: 8 mL via INTRAVENOUS

## 2013-08-15 ENCOUNTER — Other Ambulatory Visit: Payer: Self-pay | Admitting: Pharmacist Clinician (PhC)/ Clinical Pharmacy Specialist

## 2013-08-15 ENCOUNTER — Ambulatory Visit (INDEPENDENT_AMBULATORY_CARE_PROVIDER_SITE_OTHER): Payer: Medicare Other | Admitting: Pharmacist Clinician (PhC)/ Clinical Pharmacy Specialist

## 2013-08-15 VITALS — BP 124/60 | HR 72

## 2013-08-15 DIAGNOSIS — Z7901 Long term (current) use of anticoagulants: Secondary | ICD-10-CM

## 2013-08-15 DIAGNOSIS — I4891 Unspecified atrial fibrillation: Secondary | ICD-10-CM

## 2013-08-15 LAB — POCT INR: INR: 2.6

## 2013-08-15 MED ORDER — WARFARIN SODIUM 2 MG PO TABS: ORAL_TABLET | ORAL | Status: DC

## 2013-08-19 IMAGING — CR DG CHEST 2V
3 series · 3 of 3 positions shown · non-contrast
Comparison: Chest CT 02/10/2013

CLINICAL DATA: Effusion, shortness of breath.

CHEST - 2 VIEW

[view not recorded (1 of 3)]
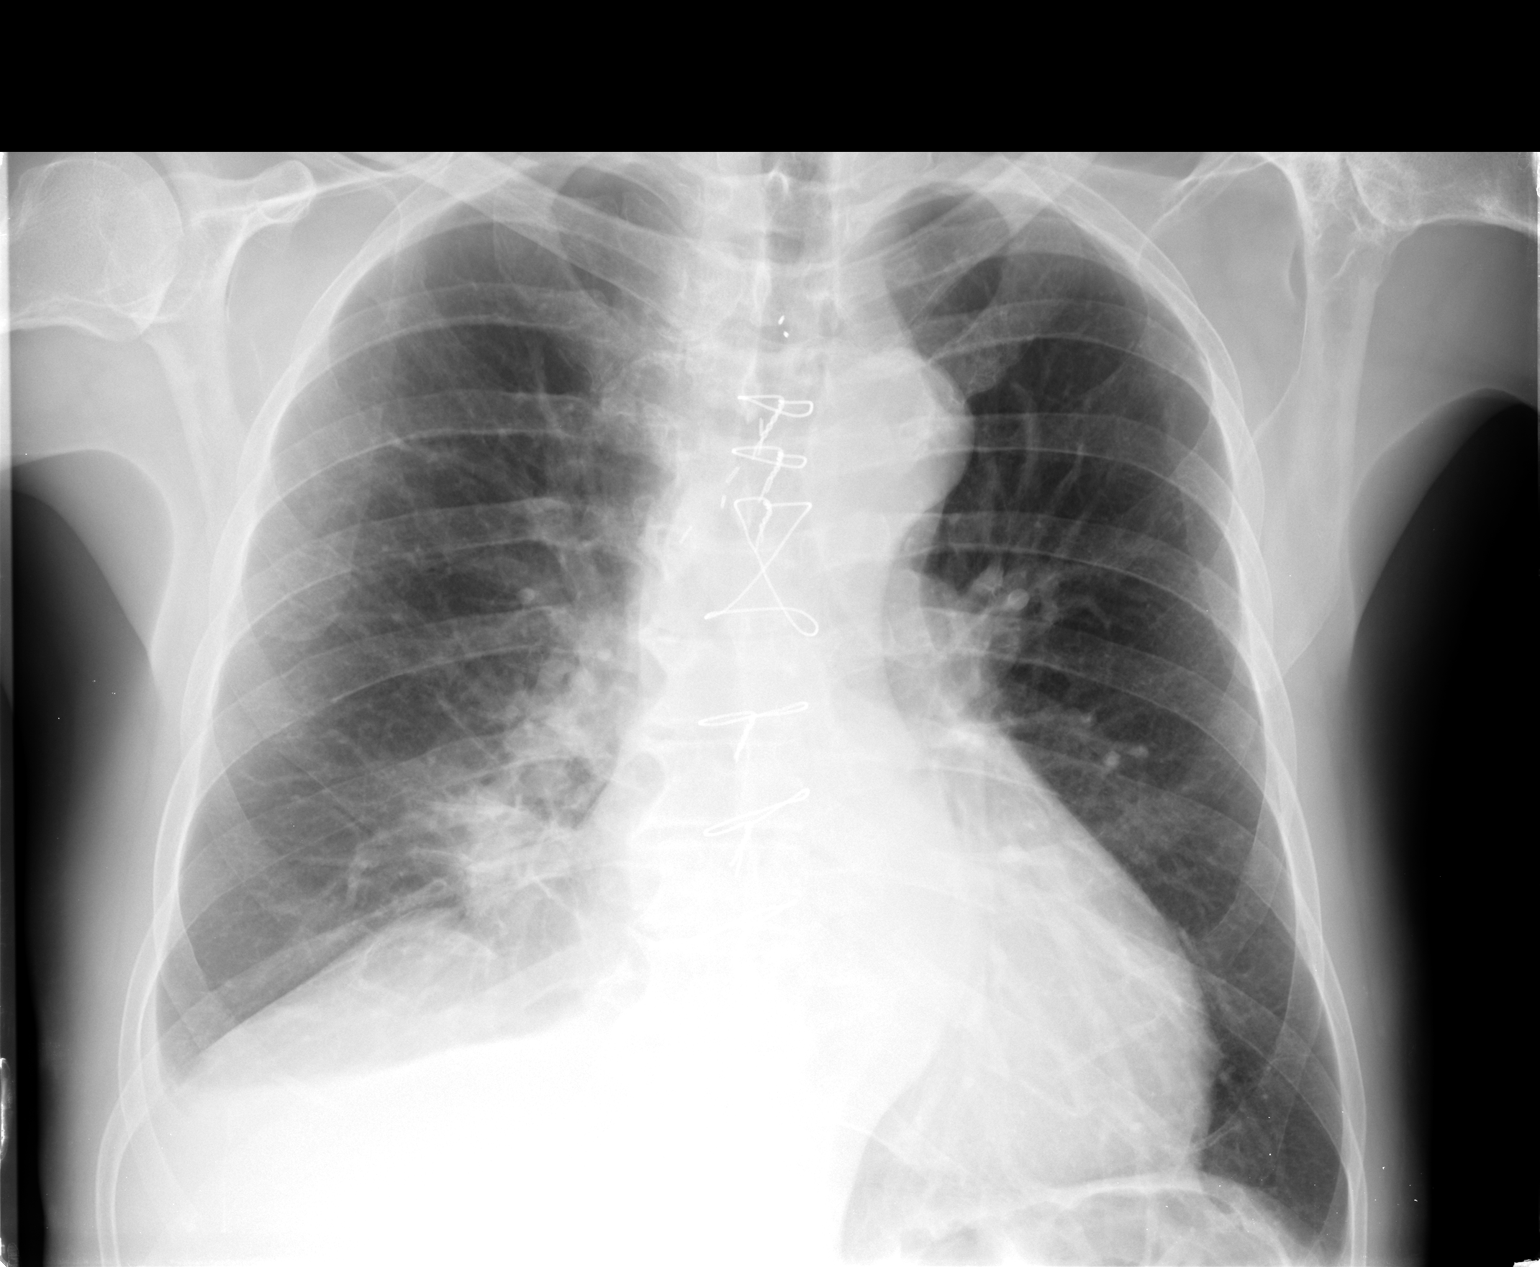

[view not recorded (2 of 3)]
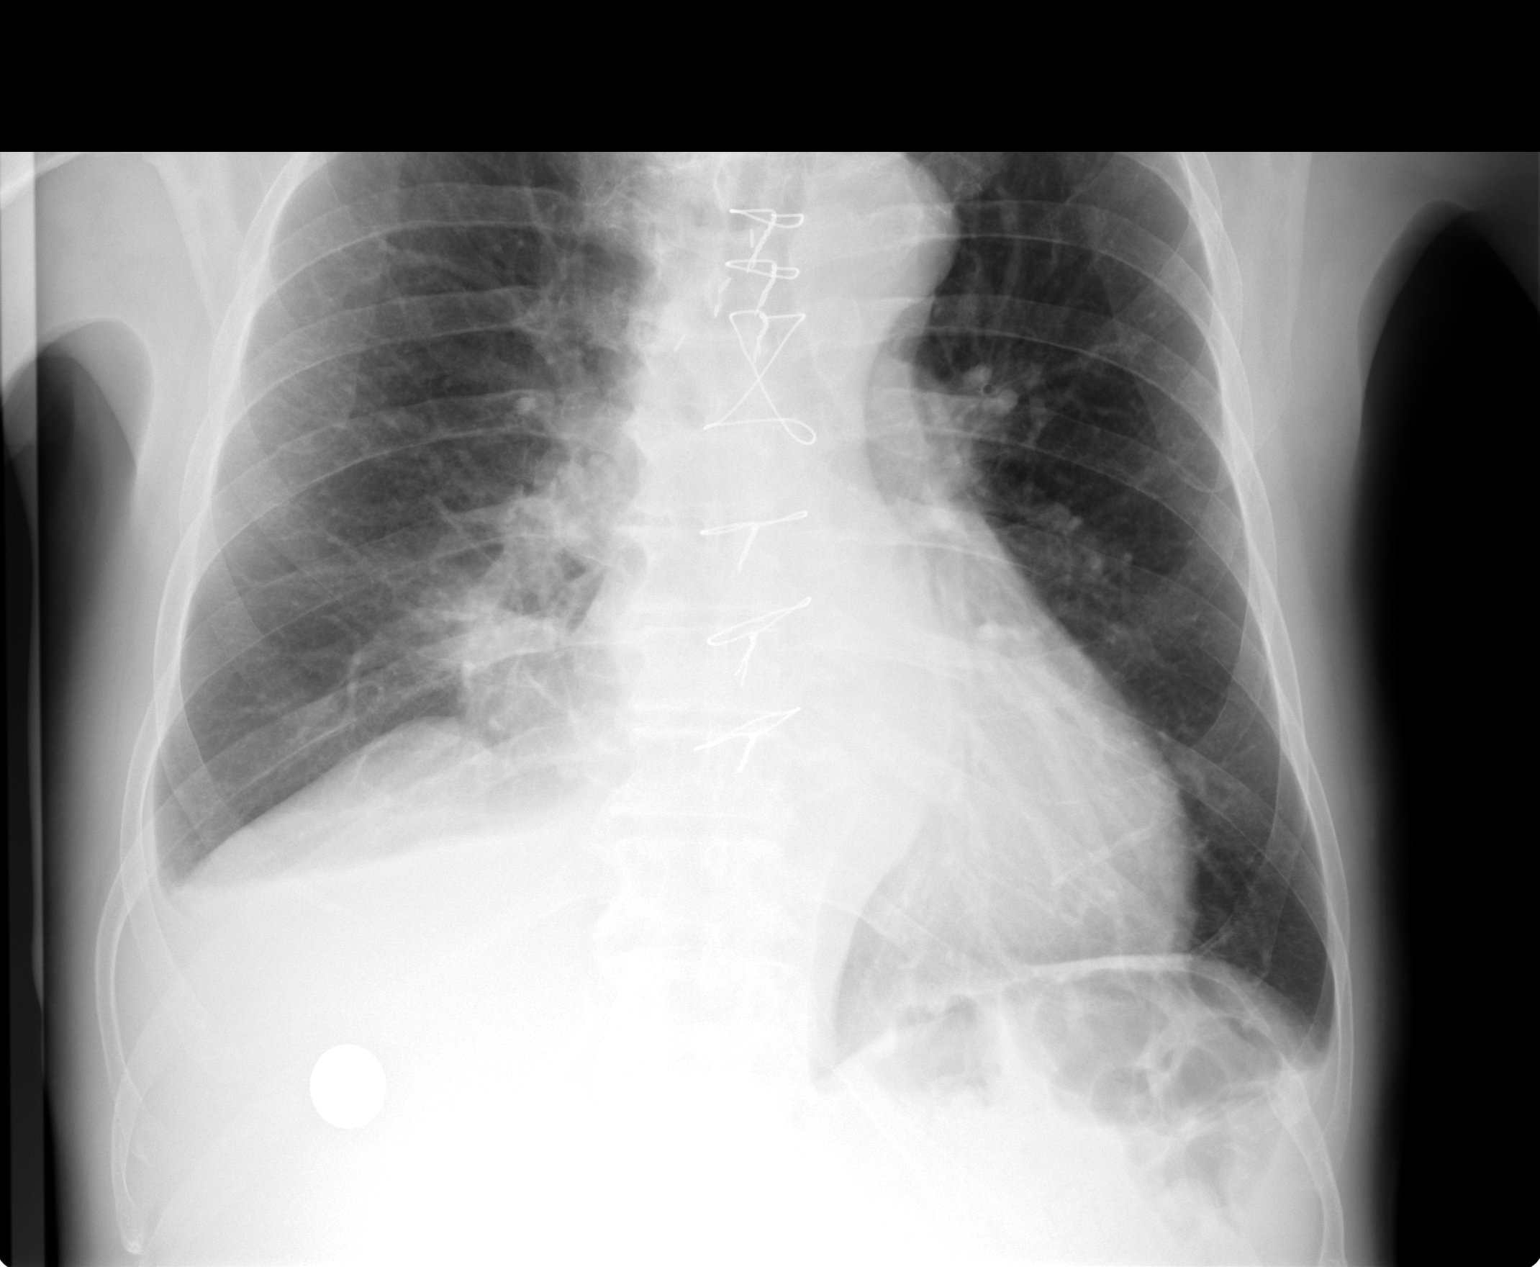

[view not recorded (3 of 3)]
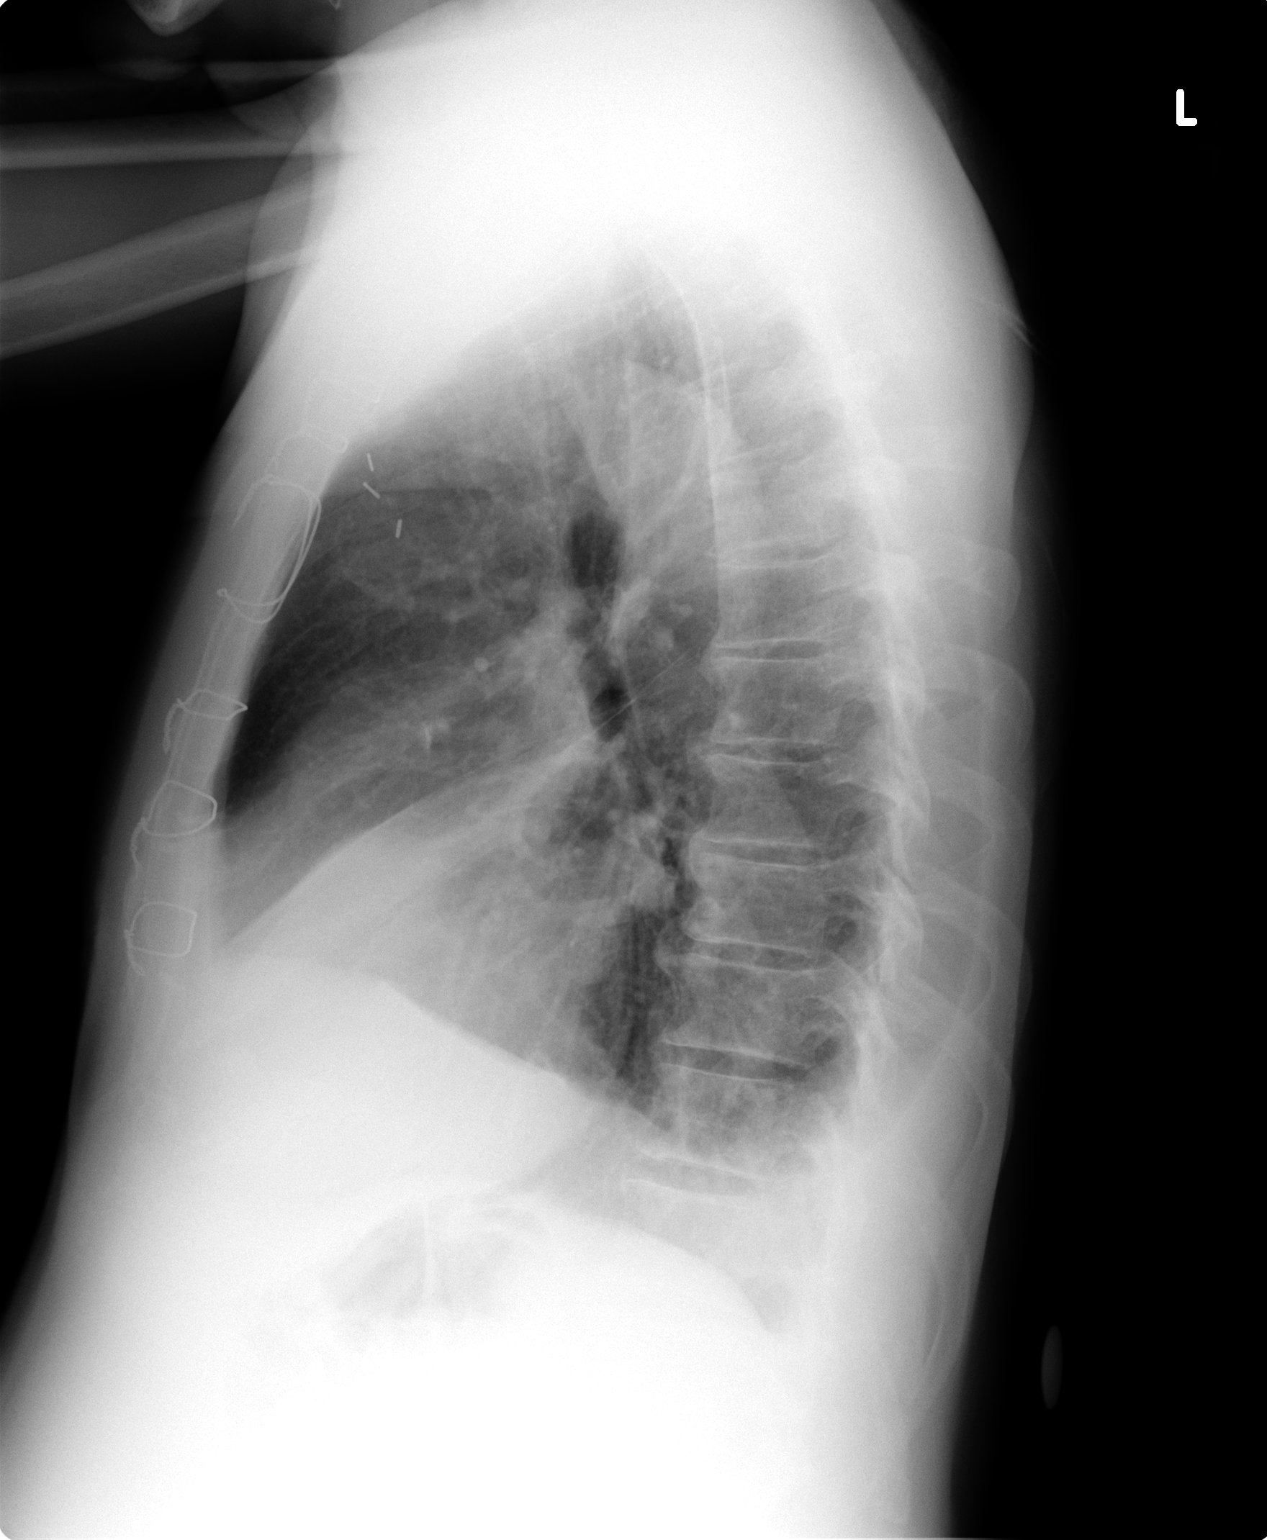

[3 of 3 positions shown; findings below may reference images not displayed]

FINDINGS: Small right pleural effusion, stable or slightly
decreased since prior CT.  The right middle lobe airspace opacity,
likely atelectasis.  Left lung is clear.  Heart is borderline
enlarged.  Tortuosity of the thoracic aorta.  Prior median
sternotomy.
IMPRESSION: Stable or slightly decreased size of the small to moderate right
pleural effusion.  Right basilar atelectasis.

## 2013-09-11 ENCOUNTER — Inpatient Hospital Stay (HOSPITAL_COMMUNITY)
Admission: AD | Admit: 2013-09-11 | Discharge: 2013-09-18 | DRG: 291 | Disposition: A | Discharge status: Home Health | Payer: Medicare Other | Source: Ambulatory Visit | Attending: Internal Medicine | Admitting: Internal Medicine

## 2013-09-11 ENCOUNTER — Inpatient Hospital Stay (HOSPITAL_COMMUNITY): Payer: Medicare Other

## 2013-09-11 ENCOUNTER — Encounter (HOSPITAL_COMMUNITY): Payer: Self-pay | Admitting: General Practice

## 2013-09-11 DIAGNOSIS — I4891 Unspecified atrial fibrillation: Secondary | ICD-10-CM

## 2013-09-11 DIAGNOSIS — I2789 Other specified pulmonary heart diseases: Secondary | ICD-10-CM | POA: Diagnosis present

## 2013-09-11 DIAGNOSIS — R011 Cardiac murmur, unspecified: Secondary | ICD-10-CM | POA: Diagnosis present

## 2013-09-11 DIAGNOSIS — R160 Hepatomegaly, not elsewhere classified: Secondary | ICD-10-CM | POA: Diagnosis present

## 2013-09-11 DIAGNOSIS — D638 Anemia in other chronic diseases classified elsewhere: Secondary | ICD-10-CM | POA: Diagnosis present

## 2013-09-11 DIAGNOSIS — I50811 Acute right heart failure: Secondary | ICD-10-CM | POA: Diagnosis present

## 2013-09-11 DIAGNOSIS — I71011 Dissection of aortic arch: Secondary | ICD-10-CM | POA: Diagnosis present

## 2013-09-11 DIAGNOSIS — Z801 Family history of malignant neoplasm of trachea, bronchus and lung: Secondary | ICD-10-CM

## 2013-09-11 DIAGNOSIS — N400 Enlarged prostate without lower urinary tract symptoms: Secondary | ICD-10-CM | POA: Diagnosis present

## 2013-09-11 DIAGNOSIS — I7101 Dissection of thoracic aorta: Secondary | ICD-10-CM | POA: Diagnosis present

## 2013-09-11 DIAGNOSIS — D649 Anemia, unspecified: Secondary | ICD-10-CM | POA: Diagnosis present

## 2013-09-11 DIAGNOSIS — T502X5A Adverse effect of carbonic-anhydrase inhibitors, benzothiadiazides and other diuretics, initial encounter: Secondary | ICD-10-CM | POA: Diagnosis present

## 2013-09-11 DIAGNOSIS — I712 Thoracic aortic aneurysm, without rupture, unspecified: Secondary | ICD-10-CM

## 2013-09-11 DIAGNOSIS — I872 Venous insufficiency (chronic) (peripheral): Secondary | ICD-10-CM | POA: Diagnosis present

## 2013-09-11 DIAGNOSIS — J90 Pleural effusion, not elsewhere classified: Secondary | ICD-10-CM

## 2013-09-11 DIAGNOSIS — G35 Multiple sclerosis: Secondary | ICD-10-CM

## 2013-09-11 DIAGNOSIS — F3289 Other specified depressive episodes: Secondary | ICD-10-CM | POA: Diagnosis present

## 2013-09-11 DIAGNOSIS — D62 Acute posthemorrhagic anemia: Secondary | ICD-10-CM | POA: Diagnosis present

## 2013-09-11 DIAGNOSIS — I455 Other specified heart block: Secondary | ICD-10-CM

## 2013-09-11 DIAGNOSIS — T45515A Adverse effect of anticoagulants, initial encounter: Secondary | ICD-10-CM | POA: Diagnosis present

## 2013-09-11 DIAGNOSIS — I4819 Other persistent atrial fibrillation: Secondary | ICD-10-CM | POA: Diagnosis present

## 2013-09-11 DIAGNOSIS — I959 Hypotension, unspecified: Secondary | ICD-10-CM | POA: Diagnosis present

## 2013-09-11 DIAGNOSIS — I71019 Dissection of thoracic aorta, unspecified: Secondary | ICD-10-CM | POA: Diagnosis present

## 2013-09-11 DIAGNOSIS — G35D Multiple sclerosis, unspecified: Secondary | ICD-10-CM | POA: Diagnosis present

## 2013-09-11 DIAGNOSIS — I059 Rheumatic mitral valve disease, unspecified: Secondary | ICD-10-CM | POA: Diagnosis present

## 2013-09-11 DIAGNOSIS — Z7901 Long term (current) use of anticoagulants: Secondary | ICD-10-CM

## 2013-09-11 DIAGNOSIS — Z808 Family history of malignant neoplasm of other organs or systems: Secondary | ICD-10-CM

## 2013-09-11 DIAGNOSIS — I509 Heart failure, unspecified: Secondary | ICD-10-CM | POA: Diagnosis present

## 2013-09-11 DIAGNOSIS — T448X5A Adverse effect of centrally-acting and adrenergic-neuron-blocking agents, initial encounter: Secondary | ICD-10-CM | POA: Diagnosis present

## 2013-09-11 DIAGNOSIS — N179 Acute kidney failure, unspecified: Secondary | ICD-10-CM | POA: Diagnosis present

## 2013-09-11 DIAGNOSIS — R609 Edema, unspecified: Secondary | ICD-10-CM

## 2013-09-11 DIAGNOSIS — I369 Nonrheumatic tricuspid valve disorder, unspecified: Secondary | ICD-10-CM | POA: Diagnosis present

## 2013-09-11 DIAGNOSIS — I10 Essential (primary) hypertension: Secondary | ICD-10-CM | POA: Diagnosis present

## 2013-09-11 DIAGNOSIS — T46905A Adverse effect of unspecified agents primarily affecting the cardiovascular system, initial encounter: Secondary | ICD-10-CM | POA: Diagnosis present

## 2013-09-11 DIAGNOSIS — Z8249 Family history of ischemic heart disease and other diseases of the circulatory system: Secondary | ICD-10-CM

## 2013-09-11 DIAGNOSIS — I071 Rheumatic tricuspid insufficiency: Secondary | ICD-10-CM | POA: Diagnosis present

## 2013-09-11 DIAGNOSIS — I7100 Dissection of unspecified site of aorta: Secondary | ICD-10-CM | POA: Diagnosis present

## 2013-09-11 DIAGNOSIS — R2681 Unsteadiness on feet: Secondary | ICD-10-CM | POA: Diagnosis present

## 2013-09-11 DIAGNOSIS — I498 Other specified cardiac arrhythmias: Secondary | ICD-10-CM | POA: Diagnosis present

## 2013-09-11 DIAGNOSIS — I5033 Acute on chronic diastolic (congestive) heart failure: Principal | ICD-10-CM | POA: Diagnosis present

## 2013-09-11 DIAGNOSIS — E785 Hyperlipidemia, unspecified: Secondary | ICD-10-CM | POA: Diagnosis present

## 2013-09-11 DIAGNOSIS — T465X5A Adverse effect of other antihypertensive drugs, initial encounter: Secondary | ICD-10-CM | POA: Diagnosis present

## 2013-09-11 DIAGNOSIS — I7102 Dissection of abdominal aorta: Secondary | ICD-10-CM | POA: Diagnosis present

## 2013-09-11 DIAGNOSIS — R269 Unspecified abnormalities of gait and mobility: Secondary | ICD-10-CM

## 2013-09-11 DIAGNOSIS — R601 Generalized edema: Secondary | ICD-10-CM

## 2013-09-11 DIAGNOSIS — F329 Major depressive disorder, single episode, unspecified: Secondary | ICD-10-CM | POA: Diagnosis present

## 2013-09-11 DIAGNOSIS — R600 Localized edema: Secondary | ICD-10-CM | POA: Diagnosis present

## 2013-09-11 DIAGNOSIS — N289 Disorder of kidney and ureter, unspecified: Secondary | ICD-10-CM | POA: Diagnosis present

## 2013-09-11 DIAGNOSIS — I079 Rheumatic tricuspid valve disease, unspecified: Secondary | ICD-10-CM

## 2013-09-11 HISTORY — DX: Shortness of breath: R06.02

## 2013-09-11 HISTORY — DX: Personal history of other medical treatment: Z92.89

## 2013-09-11 LAB — COMPREHENSIVE METABOLIC PANEL
ALT: 19 U/L (ref 0–53)
AST: 20 U/L (ref 0–37)
Albumin: 3.4 g/dL — ABNORMAL LOW (ref 3.5–5.2)
Alkaline Phosphatase: 298 U/L — ABNORMAL HIGH (ref 39–117)
BUN: 113 mg/dL — ABNORMAL HIGH (ref 6–23)
CO2: 20 mEq/L (ref 19–32)
Calcium: 8.8 mg/dL (ref 8.4–10.5)
Chloride: 104 mEq/L (ref 96–112)
Creatinine, Ser: 4.26 mg/dL — ABNORMAL HIGH (ref 0.50–1.35)
GFR calc Af Amer: 15 mL/min — ABNORMAL LOW (ref >90)
GFR calc non Af Amer: 13 mL/min — ABNORMAL LOW (ref >90)
Glucose, Bld: 94 mg/dL (ref 70–99)
Potassium: 5.3 mEq/L — ABNORMAL HIGH (ref 3.5–5.1)
Sodium: 140 mEq/L (ref 135–145)
Total Bilirubin: 0.7 mg/dL (ref 0.3–1.2)
Total Protein: 6.9 g/dL (ref 6.0–8.3)

## 2013-09-11 LAB — CBC
HCT: 17.9 % — ABNORMAL LOW (ref 39.0–52.0)
Hemoglobin: 5.9 g/dL — CL (ref 13.0–17.0)
MCH: 27.6 pg (ref 26.0–34.0)
MCHC: 33 g/dL (ref 30.0–36.0)
MCV: 83.6 fL (ref 78.0–100.0)
Platelets: 145 10*3/uL — ABNORMAL LOW (ref 150–400)
RBC: 2.14 MIL/uL — ABNORMAL LOW (ref 4.22–5.81)
RDW: 16.2 % — ABNORMAL HIGH (ref 11.5–15.5)
WBC: 6.8 10*3/uL (ref 4.0–10.5)

## 2013-09-11 LAB — PROTIME-INR
INR: 5.19 (ref 0.00–1.49)
Prothrombin Time: 45.7 seconds — ABNORMAL HIGH (ref 11.6–15.2)

## 2013-09-11 LAB — PRO B NATRIURETIC PEPTIDE: Pro B Natriuretic peptide (BNP): 3366 pg/mL — ABNORMAL HIGH (ref 0–125)

## 2013-09-11 LAB — PREPARE RBC (CROSSMATCH)

## 2013-09-11 MED ORDER — DIMETHYL FUMARATE 120 & 240 MG PO MISC
1.0000 | Freq: Two times a day (BID) | ORAL | Status: DC
  Administered 2013-09-11 – 2013-09-18 (×14): 1 via ORAL
  Filled 2013-09-11 (×14): qty 1

## 2013-09-11 MED ORDER — DOCUSATE SODIUM 100 MG PO CAPS
100.0000 mg | ORAL_CAPSULE | Freq: Every day | ORAL | Status: DC | PRN
  Filled 2013-09-11: qty 1

## 2013-09-11 MED ORDER — ACETAMINOPHEN 650 MG RE SUPP: 650.0000 mg | Freq: Four times a day (QID) | RECTAL | Status: DC | PRN

## 2013-09-11 MED ORDER — ATORVASTATIN CALCIUM 10 MG PO TABS
10.0000 mg | ORAL_TABLET | Freq: Every day | ORAL | Status: DC
  Administered 2013-09-12 – 2013-09-17 (×6): 10 mg via ORAL
  Filled 2013-09-11 (×7): qty 1

## 2013-09-11 MED ORDER — QUETIAPINE FUMARATE 25 MG PO TABS
25.0000 mg | ORAL_TABLET | Freq: Every evening | ORAL | Status: DC | PRN
  Administered 2013-09-11 – 2013-09-17 (×3): 25 mg via ORAL
  Filled 2013-09-11 (×3): qty 1

## 2013-09-11 MED ORDER — ACETAMINOPHEN 325 MG PO TABS
650.0000 mg | ORAL_TABLET | Freq: Four times a day (QID) | ORAL | Status: DC | PRN
  Filled 2013-09-11: qty 2

## 2013-09-11 MED ORDER — SODIUM CHLORIDE 0.9 % IV SOLN: INTRAVENOUS | Status: DC

## 2013-09-11 MED ORDER — FOLIC ACID 1 MG PO TABS
1.0000 mg | ORAL_TABLET | Freq: Every day | ORAL | Status: DC
  Administered 2013-09-12 – 2013-09-18 (×7): 1 mg via ORAL
  Filled 2013-09-11 (×8): qty 1

## 2013-09-11 MED ORDER — DALFAMPRIDINE ER 10 MG PO TB12
10.0000 mg | ORAL_TABLET | Freq: Two times a day (BID) | ORAL | Status: DC
  Administered 2013-09-11 – 2013-09-18 (×14): 10 mg via ORAL
  Filled 2013-09-11 (×17): qty 1

## 2013-09-11 NOTE — Consult Note (Signed)
Reason for Consult: Multiple sclerosis with increasing gait abnormality in the past several weeks.  HPI:                                                                                                                                          [name] is an 67 y.o. male with a history of multiple sclerosis, hypertension, dyslipidemia, congestive heart failure, atrial fibrillation on anticoagulation, and mitral regurgitation was admitted with increasing edema involving lower extremities. Neurology consultation was obtained because of increasing problems with his gait. He's become increasingly unstable and has experienced multiple falls over the past several weeks. He has resorted to using a rolling walker over the past several days with no recurrent falls. Previously he had been using a cane. He has not developed symptoms involving his upper extremities. No change in his speech. He is currently taking Tecifidera and Ampyra for management of illness and is followed at [facility] by [name].   Past Medical History  Diagnosis Date  . Neuromuscular disorder   . Hypertension   . Anemia   . Depression   . Multiple sclerosis   . Thrombocytopenia   . Dyslipidemia   . Aortic dissection, thoracic   . BPH (benign prostatic hyperplasia)   . Hemorrhoids   . Aortic dissection 11/05/1998    S/P emergency repair of acute type A aortic dissection with resuspension of native aortic valve  . 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 Chest pain 11/18/2009    2D Echo - EF >55%, right ventricle is moderately dilated, right atrium is severely dilated, moderate-severe tricuspid regurgitation; R/P Myoview - EF 56%, no scintigraphic evidence of inducible myocardial ischemia, EKG negative for ischemia,   . CHF (congestive heart failure) 06/23/47    2D Echo - EF  50-55%, mild-moderately dilated right ventricle, mild-moderate tricuspid valve regurgitation, moderately dilated right atrium  . Bilateral lower extremity edema   . Atrial fibrillation 01/30/2013    Atrial fibrillation   . Pleural effusion, right, large 01/30/2013  . Severe tricuspid valve regurgitation 01/30/2013    Severe tricuspid regurg by recent 2-D echo with a dilated tricuspid annulus, moderate pulmonary hypertension and biatrial enlargement   . Mitral regurgitation 02/20/2013  . Lower extremity edema 02/20/2013    Past Surgical History  Procedure Laterality Date  . Internal, external hemorrhoidectomy, general anesthesia,prone position.  2009  . Open reduction and internal fixation of right distal radius fracture using hand innovations distal radius volar locking plate.  07/2005    Dr Ninfa Linden  . Tee without cardioversion N/A 02/17/2013    Procedure: TRANSESOPHAGEAL ECHOCARDIOGRAM (TEE);  Surgeon: [name], MD;  Location: [facility] ENDOSCOPY;  Service: Cardiovascular;  Laterality: N/A;  . Repair of acute type a aortic dissection with resuspension  of native aortic valve  10/15/1998    Dr Roxy Manns    Family History  Problem Relation Age of Onset  . Thyroid cancer Father     smoked  . Lung cancer Brother     smoked  . Heart attack Mother   . Emphysema Brother     smoked    Social History:  reports that he has never smoked. He has never used smokeless tobacco. He reports that he drinks alcohol. He reports that he does not use illicit drugs.  No Known Allergies  MEDICATIONS:                                                                                                                     I have reviewed the patient's current medications.   ROS:                                                                                                                                       History obtained from spouse and the patient  General ROS: negative for - chills, fatigue, fever, night  sweats, weight gain or weight loss Psychological ROS: negative for - behavioral disorder, hallucinations, memory difficulties, mood swings or suicidal ideation Ophthalmic ROS: negative for - blurry vision, double vision, eye pain or loss of vision ENT ROS: negative for - epistaxis, nasal discharge, oral lesions, sore throat, tinnitus or vertigo Allergy and Immunology ROS: negative for - hives or itchy/watery eyes Hematological and Lymphatic ROS: negative for - bleeding problems, bruising or swollen lymph nodes Endocrine ROS: negative for - galactorrhea, hair pattern changes, polydipsia/polyuria or temperature intolerance Respiratory ROS: negative for - cough, hemoptysis, shortness of breath or wheezing Cardiovascular ROS: negative for - chest pain, dyspnea on exertion, edema or irregular heartbeat Gastrointestinal ROS: negative for - abdominal pain, diarrhea, hematemesis, nausea/vomiting or stool incontinence Genito-Urinary ROS: negative for - dysuria, hematuria, incontinence or urinary frequency/urgency Musculoskeletal ROS: negative for - joint swelling or muscular weakness Neurological ROS: as noted in HPI Dermatological ROS: negative for rash and skin lesion changes   Blood pressure 88/50, pulse 75, temperature 97.4 F (36.3 C), temperature source Oral, resp. rate 18, height 5\' 8"  (1.727 m), weight 86.5 kg (190 lb 11.2 oz), SpO2 100.00%.   Neurologic Examination:  Mental Status: Alert, oriented x3, thought content appropriate. Patient was able to remember 2 of 3 objects after 3 minutes. Long-term memory was good. Speech fluent without evidence of aphasia. Able to follow commands without difficulty. Cranial Nerves: II-Visual fields were normal. III/IV/VI-Pupils were equal and reacted. Extraocular movements were full and conjugate.    V/VII-no facial numbness and no facial  weakness. VIII-normal. X-normal speech and symmetrical palatal movement. XII-midline tongue extension Motor: Moderately severe proximal weakness involving right lower extremity with mild weakness distally; mild to moderate left hip flexor and hamstring weakness with minimal quadriceps weakness. Right lower extremity strength distally was normal. Muscle tone was increased in lower extremities, right greater than left. Sensory: Normal throughout. Deep Tendon Reflexes: 3+ and symmetric. Plantars: Flexor bilaterally Cerebellar: Mild bilateral intention tremor with finger-to-nose testing.  No results found for this basename: cbc, bmp, coags, chol, tri, ldl, hga1c    No results found for this or any previous visit (from the past 69 hour(s)).  No results found.  Assessment/Plan: 43.  67 year old man with multiple sclerosis with progressive gait instability and lower extremity weakness, most likely manifestation of progression of multiple sclerosis. Gait abnormalities may well be exacerbated by swelling and fluid retention involving lower extremities, exacerbating lower extremity weakness.  2. Edema of lower extremities is unlikely related to use of Tecifidera or Ampyra for MS management, as this is not a side effect described with either of these medications.   Recommendations: 1. No change in current management of the mass including no changes in doses of Tecifidera and Ampyra. 2. MRI of cervical and thoracic spine with contrast to rule out new MS lesion(s). 3. Physical therapy consultation for gait evaluation and recommendations for safety with ambulation.   C.R. Nicole Kindred, MD [facility] [phone]  09/11/2013, 8:21 PM

## 2013-09-11 NOTE — Progress Notes (Signed)
Pt arrived in room 4e06, pt stable, MD notified of pt's arrival.  Will continue to monitor.

## 2013-09-11 NOTE — Progress Notes (Signed)
CRITICAL VALUE ALERT  Critical value received:  INR=5.19  Date of notification:  09/11/13  Time of notification:  2051  Critical value read back: yes  Nurse who received alert:  Doree Albee  MD notified (1st page):  Levi Aland NP  Time of first page:  2055  MD notified (2nd page):  Time of second page:  Responding MD:  Levi Aland NP  Time MD responded:  2105

## 2013-09-11 NOTE — Progress Notes (Addendum)
From: Dr. Zada Girt  Story: 67yo male with a hx of tricuspid insufficiency, currently managed medically with diuretics. Pt has noted to have increasing edema, failed outpatient diuresis. Also has hx of MS with recent med changes. [name] is requesting inpt mgt of volume overload with inpt Neurology consult to address MS med changes. [name] discuss case with Neurology prior to direct admit to [facility]. Accepted to med bed

## 2013-09-11 NOTE — Consult Note (Signed)
CONSULTATION NOTE  Reason for Consult: Hypotension, LE edema  Requesting Physician: Dr. Darrick Meigs  Cardiologist: Dr. [name]  HPI: This is a 67 y.o. male patient of Dr. [name] with a history of hemispheric repair of type A aortic dissection with resuspension of the native aortic valve in February 2000 Dr. Ricard Dillon, chronic aortic dissection extends from the visualized thoracic aorta into the right common iliac artery descending, multiple sclerosis, dyslipidemia, hypertension, and anemia. Patient's last 2-D echocardiogram was 01/17/2013 revealed an ejection fraction of 50-55%.. Wall motion was normal. Aortic valve surgery with a mild central regurgitation. Aortic root was dilated at the ST junction. Mild to moderate mitral valve regurgitation. Left atrium was severely dilated 35.5 cm. The right ventricle cavity size was mildly dilated. Right atrium was severely dilated at 32.5 cm. The tricuspid valve showed severe regurgitation with reversal of flow in the hepatic veins. Dilated tricuspid annulus measuring 6.16 cm. The pulmonary pressure was 44 mmHg. Patient's last nuclear stress test was in 2011 and showed no significant ischemia was considered low risk. CT of the abdomen and pelvis 4/29.14 showed chronic aortic dissection that extends into the right common iliac artery. Small amount of ascites. Large right pleural effusion. Diffuse subcutaneous edema. These findings may be on the basis of elevated right heart filling pressures supported by cardiomegaly. He was recently seen by Dr. [name] for dyspnea on exertion which recently has gotten worse. He reports increased lower extremity edema. He usually swims but as of one month ago he had to stop swimming because of increased shortness of breath and fatigue. He denies orthopnea, PND, chest pain, nausea, vomiting, fever, palpitations. He does report dizziness when stooping over on occasion. EKG done in our office shows atrial fibrillation with controlled  ventricular rate, 62 beats per minute. [name] has adjusted his diuretics which has resulted in moderate improvement in his lower extremity edema. He remains in atrial fibrillation with a controlled ventricular response. I believe his dyspnea and edema are related to a combination of his severe TR along with his A. Fib. He is a candidate for Coumadin anticoagulation for stroke prophylaxis which has since been started and followed here in our office. He saw [name] for consideration of tricuspid valve annuloplasty but thought to be too high risk for surgical intervention. He is on low-dose diuretic with minimal peripheral edema. He does have multiple sclerosis and is minimally ambulatory and spent most of his day in the chair although he does swim every other day.  He is now admitted for progressive LE edema and weight gain (166 lb -> 190 lb since 04/2013), but denies chest pain or shortness of breath.  We are asked to consult given the patient's history of severe valvular heart disease.   PMHx:  Past Medical History  Diagnosis Date  . Neuromuscular disorder   . Hypertension   . Anemia   . Depression   . Multiple sclerosis   . Thrombocytopenia   . Dyslipidemia   . Aortic dissection, thoracic   . BPH (benign prostatic hyperplasia)   . Hemorrhoids   . Aortic dissection 11/05/1998    S/P emergency repair of acute type A aortic dissection with resuspension of native aortic valve  . 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 Chest pain 11/18/2009    2D Echo - EF >55%, right ventricle is moderately dilated, right atrium is  severely dilated, moderate-severe tricuspid regurgitation; R/P Myoview - EF 56%, no scintigraphic evidence of inducible myocardial ischemia, EKG negative for ischemia,   . CHF (congestive heart failure) 1946-03-25    2D Echo - EF 50-55%, mild-moderately  dilated right ventricle, mild-moderate tricuspid valve regurgitation, moderately dilated right atrium  . Bilateral lower extremity edema   . Atrial fibrillation 01/30/2013    Atrial fibrillation   . Pleural effusion, right, large 01/30/2013  . Severe tricuspid valve regurgitation 01/30/2013    Severe tricuspid regurg by recent 2-D echo with a dilated tricuspid annulus, moderate pulmonary hypertension and biatrial enlargement   . Mitral regurgitation 02/20/2013  . Lower extremity edema 02/20/2013   Past Surgical History  Procedure Laterality Date  . Internal, external hemorrhoidectomy, general anesthesia,prone position.  2009  . Open reduction and internal fixation of right distal radius fracture using hand innovations distal radius volar locking plate.  07/2005    Dr Ninfa Linden  . Tee without cardioversion N/A 02/17/2013    Procedure: TRANSESOPHAGEAL ECHOCARDIOGRAM (TEE);  Surgeon: [name], MD;  Location: [facility] ENDOSCOPY;  Service: Cardiovascular;  Laterality: N/A;  . Repair of acute type a aortic dissection with resuspension of native aortic valve  10/15/1998    Dr Roxy Manns    FAMHx: Family History  Problem Relation Age of Onset  . Thyroid cancer Father     smoked  . Lung cancer Brother     smoked  . Heart attack Mother   . Emphysema Brother     smoked    SOCHx:  reports that he has never smoked. He has never used smokeless tobacco. He reports that he drinks alcohol. He reports that he does not use illicit drugs.  ALLERGIES: No Known Allergies  ROS: A comprehensive review of systems was negative except for: Constitutional: positive for fatigue and weight gain Cardiovascular: positive for lower extremity edema Neurological: positive for dizziness, gait problems and weakness Behavioral/Psych: positive for behavior problems and mood swings  HOME MEDICATIONS: Prescriptions prior to admission  Medication Sig Dispense Refill  . dalfampridine (AMPYRA) 10 MG TB12 Take 1 tablet (10 mg  total) by mouth 2 (two) times daily.  180 tablet  1  . DIOVAN 320 MG tablet Take 320 mg by mouth daily.       Mariane Baumgarten Calcium (STOOL SOFTENER PO) 1 tablet as needed.       . doxazosin (CARDURA) 4 MG tablet Take 4 mg by mouth at bedtime.        . folic acid (FOLVITE) 1 MG tablet Take 1 mg by mouth daily.        . furosemide (LASIX) 40 MG tablet Take 80 mg by mouth daily. Or 120mg  prn      . metoprolol (LOPRESSOR) 100 MG tablet Take 100 mg by mouth 2 (two) times daily.        . potassium chloride (K-DUR) 10 MEQ tablet Take 10 mEq by mouth daily. 1 tablet every time he takes lasix      . QUEtiapine (SEROQUEL) 25 MG tablet Take 25 mg by mouth at bedtime as needed.       . rosuvastatin (CRESTOR) 5 MG tablet Take 5 mg by mouth daily.        . TECFIDERA 120 & 240 MG MISC Take 1 capsule by mouth as directed.      . warfarin (COUMADIN) 2 MG tablet Take 2 mg by mouth See admin instructions. Take 1 tablet by mouth daily or as directed. Take 1mg  half of  the 2mg  on Mondays.      . [DISCONTINUED] warfarin (COUMADIN) 2 MG tablet Take 1 tablet by mouth daily or as directed  90 tablet  1  . amLODipine (NORVASC) 5 MG tablet Take 5 mg by mouth daily.          HOSPITAL MEDICATIONS: Prior to Admission:  Prescriptions prior to admission  Medication Sig Dispense Refill  . dalfampridine (AMPYRA) 10 MG TB12 Take 1 tablet (10 mg total) by mouth 2 (two) times daily.  180 tablet  1  . DIOVAN 320 MG tablet Take 320 mg by mouth daily.       Mariane Baumgarten Calcium (STOOL SOFTENER PO) 1 tablet as needed.       . doxazosin (CARDURA) 4 MG tablet Take 4 mg by mouth at bedtime.        . folic acid (FOLVITE) 1 MG tablet Take 1 mg by mouth daily.        . furosemide (LASIX) 40 MG tablet Take 80 mg by mouth daily. Or 120mg  prn      . metoprolol (LOPRESSOR) 100 MG tablet Take 100 mg by mouth 2 (two) times daily.        . potassium chloride (K-DUR) 10 MEQ tablet Take 10 mEq by mouth daily. 1 tablet every time he takes lasix        . QUEtiapine (SEROQUEL) 25 MG tablet Take 25 mg by mouth at bedtime as needed.       . rosuvastatin (CRESTOR) 5 MG tablet Take 5 mg by mouth daily.        . TECFIDERA 120 & 240 MG MISC Take 1 capsule by mouth as directed.      . warfarin (COUMADIN) 2 MG tablet Take 2 mg by mouth See admin instructions. Take 1 tablet by mouth daily or as directed. Take 1mg  half of the 2mg  on Mondays.      . [DISCONTINUED] warfarin (COUMADIN) 2 MG tablet Take 1 tablet by mouth daily or as directed  90 tablet  1  . amLODipine (NORVASC) 5 MG tablet Take 5 mg by mouth daily.          VITALS: Blood pressure 88/50, pulse 75, temperature 97.4 F (36.3 C), temperature source Oral, resp. rate 18, height 5\' 8"  (1.727 m), weight 190 lb 11.2 oz (86.5 kg), SpO2 100.00%.  PHYSICAL EXAM: General appearance: alert, no distress and lying flat comfortably Neck: JVD - 10 cm above sternal notch and no carotid bruit Lungs: diminished breath sounds bibasilar Heart: irregularly irregular rhythm and systolic murmur: holosystolic 2/6, blowing at apex Abdomen: protuberant, mild fluid wave, +tympany, hypoactive BS Extremities: edema 4+ (anasarca), pitting, weeping Pulses: 1+ bilateral DP/PT pulses Skin: Skin color, texture, turgor normal. No rashes or lesions Neurologic: Mental status: Alert, oriented, thought content appropriate Psych: Appears flat, monotonous  LABS: No results found for this or any previous visit (from the past 48 hour(s)).  IMAGING: No results found.  HOSPITAL DIAGNOSES: Principal Problem:   Acute right-sided congestive heart failure Active Problems:   Aneurysm of aortic arch   Multiple sclerosis   Persistent atrial fibrillation   Severe tricuspid valve regurgitation   Dissection of aorta, thoracic   Long term (current) use of anticoagulants   Bilateral leg edema   IMPRESSION: 1. Acute right-sided congestive heart failure, secondary to severe TR 2. Severe TR, moderate MR (due to annular  dilitation) - not thought to be a surgical candidate 3. History of stable thoracic aortic dissection and aortic  arch dilitation, s/p surgical repair 4. Persistent atrial fibrillation on warfarin 5. Progressive multiple sclerosis  RECOMMENDATION: 1. Mr. Wiess is markedly volume-overloaded secondary to acute congestive right-sided failure, owing to severe tricuspid regurgitation. In addition, he is hypotensive, therefore I agree with holding bp meds for now, however, he will need some b-blocker for rate-control of his a-fib.  I would wait to start diuresis until tomorrow morning, to see if his BP rebounds overnight. Best option would be a lasix gtts, which I will likely order in the morning. It may be helpful to get an abdominal ultrasound as well to help quantitate his ascites - may benefit from paracentesis.          Unfortunately there are not many options given his valvular heart disease and progressive MS.  I will       discuss with Dr. [name] the possibility of a getting second opinion regarding valve surgery and the       associated morbidity/mortality he may face with this.  Otherwise, his prognosis with regards to his heart       failure is poor.  Thanks for consulting [place]. I will follow-up again in the morning.  Time Spent Directly with Patient: 60 minutes  Pixie Casino, MD, [facility] Attending Cardiologist [facility]  Urvi Imes C 09/11/2013, 7:36 PM

## 2013-09-11 NOTE — Progress Notes (Signed)
CRITICAL VALUE ALERT  Critical value received:  Hgb=5.9  Date of notification:  09/11/2013  Time of notification:  2025  Critical value read back: yes Nurse who received alert:  Doree Albee  MD notified (1st page):  Raliegh Ip Schorr  Time of first page:  2030  MD notified (2nd page):  Time of second page:  Responding MD:  Levi Aland NP  Time MD responded:  2105

## 2013-09-11 NOTE — H&P (Signed)
PCP:   Criselda Peaches, MD   Chief Complaint:  Leg swelling  HPI:  67 year old male who  has a past medical history of Neuromuscular disorder; Hypertension; Anemia; Depression; Multiple sclerosis; Thrombocytopenia; Dyslipidemia; Aortic dissection, thoracic; BPH (benign prostatic hyperplasia); Hemorrhoids; Aortic dissection (11/05/1998); Aneurysm of aortic arch (04/04/2012); Chest pain (11/18/2009); CHF (congestive heart failure) (1946/06/24); Bilateral lower extremity edema; Atrial fibrillation (01/30/2013); Pleural effusion, right, large (01/30/2013); Severe tricuspid valve regurgitation (01/30/2013); Mitral regurgitation (02/20/2013); and Lower extremity edema (02/20/2013). Was sent to the hospital on the PCP office, patient has been closely followed by PCP for fluid retention and leg edema and received Lasix without much improvement so he was sent to the hospital for IV Lasix. Patient has history of diastolic heart failure, atrial fibrillation and he is on anticoagulation with Coumadin. He denies shortness of breath or chest pain, not requiring oxygen at this time his O2 sats 100% on room air. Patient does have lower extremity swelling evident in both lower extremities. Patient also has history of multiple sclerosis and is followed by neurology. He denies recent fever no nausea vomiting, admits to having some loose stool secondary to MS medications. Patient has gained 5 pounds over past few days.  Allergies:  No Known Allergies    Past Medical History  Diagnosis Date  . Neuromuscular disorder   . Hypertension   . Anemia   . Depression   . Multiple sclerosis   . Thrombocytopenia   . Dyslipidemia   . Aortic dissection, thoracic   . BPH (benign prostatic hyperplasia)   . Hemorrhoids   . Aortic dissection 11/05/1998    S/P emergency repair of acute type A aortic dissection with resuspension of native aortic valve  . 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 Chest pain 11/18/2009    2D Echo - EF >55%, right ventricle is moderately dilated, right atrium is severely dilated, moderate-severe tricuspid regurgitation; R/P Myoview - EF 56%, no scintigraphic evidence of inducible myocardial ischemia, EKG negative for ischemia,   . CHF (congestive heart failure) Dec 20, 1945    2D Echo - EF 50-55%, mild-moderately dilated right ventricle, mild-moderate tricuspid valve regurgitation, moderately dilated right atrium  . Bilateral lower extremity edema   . Atrial fibrillation 01/30/2013    Atrial fibrillation   . Pleural effusion, right, large 01/30/2013  . Severe tricuspid valve regurgitation 01/30/2013    Severe tricuspid regurg by recent 2-D echo with a dilated tricuspid annulus, moderate pulmonary hypertension and biatrial enlargement   . Mitral regurgitation 02/20/2013  . Lower extremity edema 02/20/2013    Past Surgical History  Procedure Laterality Date  . Internal, external hemorrhoidectomy, general anesthesia,prone position.  2009  . Open reduction and internal fixation of right distal radius fracture using hand innovations distal radius volar locking plate.  07/2005    Dr Ninfa Linden  . Tee without cardioversion N/A 02/17/2013    Procedure: TRANSESOPHAGEAL ECHOCARDIOGRAM (TEE);  Surgeon: [name], MD;  Location: [facility] ENDOSCOPY;  Service: Cardiovascular;  Laterality: N/A;  . Repair of acute type a aortic dissection with resuspension of native aortic valve  10/15/1998    Dr Roxy Manns    Prior to Admission medications   Medication Sig Start Date End Date Taking? Authorizing Provider  dalfampridine (AMPYRA) 10 MG TB12 Take 1 tablet (10 mg total) by mouth 2 (two) times daily. 03/21/13  Yes Penni Bombard, MD  DIOVAN 320 MG tablet Take 320 mg by mouth daily.  02/24/12  Yes Historical Provider, MD  Docusate Calcium (STOOL SOFTENER PO) 1 tablet as needed.    Yes Historical Provider,  MD  doxazosin (CARDURA) 4 MG tablet Take 4 mg by mouth at bedtime.     Yes Historical Provider, MD  folic acid (FOLVITE) 1 MG tablet Take 1 mg by mouth daily.     Yes Historical Provider, MD  furosemide (LASIX) 40 MG tablet Take 80 mg by mouth daily. Or 120mg  prn 01/30/13  Yes Tarri Fuller, PA-C  metoprolol (LOPRESSOR) 100 MG tablet Take 100 mg by mouth 2 (two) times daily.     Yes Historical Provider, MD  potassium chloride (K-DUR) 10 MEQ tablet Take 10 mEq by mouth daily. 1 tablet every time he takes lasix 02/02/13  Yes Cecilie Kicks, NP  QUEtiapine (SEROQUEL) 25 MG tablet Take 25 mg by mouth at bedtime as needed.    Yes Historical Provider, MD  rosuvastatin (CRESTOR) 5 MG tablet Take 5 mg by mouth daily.     Yes Historical Provider, MD  TECFIDERA 120 & 240 MG MISC Take 1 capsule by mouth as directed. 03/14/13  Yes Historical Provider, MD  warfarin (COUMADIN) 2 MG tablet Take 2 mg by mouth See admin instructions. Take 1 tablet by mouth daily or as directed. Take 1mg  half of the 2mg  on Mondays. 08/15/13  Yes Kristin Alvstad, RPH-CPP  amLODipine (NORVASC) 5 MG tablet Take 5 mg by mouth daily.      Historical Provider, MD    Social History:  reports that he has never smoked. He has never used smokeless tobacco. He reports that he drinks alcohol. He reports that he does not use illicit drugs.  Family History  Problem Relation Age of Onset  . Thyroid cancer Father     smoked  . Lung cancer Brother     smoked  . Heart attack Mother   . Emphysema Brother     smoked     All the positives are listed in BOLD  Review of Systems:  HEENT: Headache, blurred vision, runny nose, sore throat Neck: Hypothyroidism, hyperthyroidism,,lymphadenopathy Chest : Shortness of breath, history of COPD, Asthma Heart : Chest pain, history of coronary arterey disease GI:  Nausea, vomiting, diarrhea, constipation, GERD GU: Dysuria, urgency, frequency of urination, hematuria Neuro: Stroke, seizures, syncope, multiple  sclerosis Psych: Depression, anxiety, hallucinations   Physical Exam: Blood pressure 88/50, pulse 75, temperature 97.4 F (36.3 C), temperature source Oral, resp. rate 18, height 5\' 8"  (1.727 m), weight 86.5 kg (190 lb 11.2 oz), SpO2 100.00%. Constitutional:   Patient is a well-developed and well-nourished male* in no acute distress and cooperative with exam. Head: Normocephalic and atraumatic Mouth: Mucus membranes moist Eyes: PERRL, EOMI, conjunctivae normal Neck: Supple, No Thyromegaly Cardiovascular: RRR, S1 normal, S2 normal Pulmonary/Chest: CTAB, no wheezes, rales, or rhonchi Abdominal: Soft. Non-tender, non-distended, bowel sounds are normal, no masses, organomegaly, or guarding present.  Neurological: A&O x3, Strenght is normal and symmetric bilaterally, cranial nerve II-XII are grossly intact, no focal motor deficit, sensory intact to light touch bilaterally.  Extremities :bilateral 2+ pitting edema, serous fluid oozing from the right lower extremity    Assessment/Plan Active Problems:   Bilateral leg edema Multiple sclerosis Chronic diastolic CHF  Bilateral leg edema-most likely due to chronic venous insufficiency, as per patient the edema started after he had surgery for the aortic dissection 14 years ago. Patient will need wound care consultation. We'll get  a wound care consult in a.m.  Chronic diastolic CHF- at this time patient appears euvolemic, and well compensated. Patient's blood pressure is 88/50, I do not think patient requires Lasix at this time. We'll obtain chest x-ray and a BNP to rule out underlying acute diastolic heart failure. Will get cardiology consultation.  Multiple sclerosis- will continue the home medications. Called and discussed with neurology, and we will see the patient tonight to adjust medications. [name] concerned that MS medications may be causing fluid retention.  History of A. Fib- continue warfarin per pharmacy, I'm going to hold the  antihypertensives including metoprolol due to hypotension.  Hypotension- patient is on multiple antihypertensive medications at home including Diovan to 20 mg daily, Cardura 4 mg at bedtime, Lasix 80 mg daily, amlodipine 5 mg daily, Lopressor 100 mg twice a day. I would hold all these medications at this time are until the blood pressure improves. Also obtain CMP to check patient's renal functions.will avoid giving IV fluids due to history of CHFunless patient has persistent hypotension.  Code status:patient is full code  Family discussion:discussed with patient's wife at bedside   Time Spent on Admission: 75 min  [place] [facility] Pager: [phone] 09/11/2013, 6:41 PM  If 7PM-7AM, please contact night-coverage  [email or web address]  Password [email or web address]

## 2013-09-11 NOTE — Progress Notes (Signed)
INITIAL ANTICOAGULATION CONSULT BY PHARMACY  [name] takes coumadin for a.fib. His INR today is 5/19. He has already taken his dose today. Pharmacy will hold coumadin until the INR decreases to near 3 and then will begin dosing.  Arrie Senate, PharmD

## 2013-09-12 ENCOUNTER — Inpatient Hospital Stay (HOSPITAL_COMMUNITY): Payer: Medicare Other

## 2013-09-12 ENCOUNTER — Ambulatory Visit: Payer: Medicare Other | Admitting: Pharmacist Clinician (PhC)/ Clinical Pharmacy Specialist

## 2013-09-12 DIAGNOSIS — D62 Acute posthemorrhagic anemia: Secondary | ICD-10-CM | POA: Diagnosis present

## 2013-09-12 DIAGNOSIS — N289 Disorder of kidney and ureter, unspecified: Secondary | ICD-10-CM | POA: Diagnosis present

## 2013-09-12 DIAGNOSIS — D649 Anemia, unspecified: Secondary | ICD-10-CM

## 2013-09-12 DIAGNOSIS — D638 Anemia in other chronic diseases classified elsewhere: Secondary | ICD-10-CM | POA: Diagnosis present

## 2013-09-12 DIAGNOSIS — I509 Heart failure, unspecified: Secondary | ICD-10-CM

## 2013-09-12 DIAGNOSIS — R601 Generalized edema: Secondary | ICD-10-CM | POA: Diagnosis present

## 2013-09-12 DIAGNOSIS — I7101 Dissection of thoracic aorta: Secondary | ICD-10-CM | POA: Diagnosis present

## 2013-09-12 DIAGNOSIS — I71011 Dissection of aortic arch: Secondary | ICD-10-CM | POA: Diagnosis present

## 2013-09-12 LAB — CBC
HCT: 22.5 % — ABNORMAL LOW (ref 39.0–52.0)
HCT: 22 % — ABNORMAL LOW (ref 39.0–52.0)
Hemoglobin: 7.7 g/dL — ABNORMAL LOW (ref 13.0–17.0)
Hemoglobin: 7.2 g/dL — ABNORMAL LOW (ref 13.0–17.0)
MCH: 28 pg (ref 26.0–34.0)
MCH: 27 pg (ref 26.0–34.0)
MCHC: 34.2 g/dL (ref 30.0–36.0)
MCHC: 32.7 g/dL (ref 30.0–36.0)
MCV: 81.8 fL (ref 78.0–100.0)
MCV: 82.4 fL (ref 78.0–100.0)
Platelets: 131 10*3/uL — ABNORMAL LOW (ref 150–400)
Platelets: 136 10*3/uL — ABNORMAL LOW (ref 150–400)
RBC: 2.75 MIL/uL — ABNORMAL LOW (ref 4.22–5.81)
RBC: 2.67 MIL/uL — ABNORMAL LOW (ref 4.22–5.81)
RDW: 16.1 % — ABNORMAL HIGH (ref 11.5–15.5)
RDW: 16.1 % — ABNORMAL HIGH (ref 11.5–15.5)
WBC: 7.9 10*3/uL (ref 4.0–10.5)
WBC: 8.4 10*3/uL (ref 4.0–10.5)

## 2013-09-12 LAB — COMPREHENSIVE METABOLIC PANEL
ALT: 17 U/L (ref 0–53)
AST: 15 U/L (ref 0–37)
Albumin: 3.2 g/dL — ABNORMAL LOW (ref 3.5–5.2)
Alkaline Phosphatase: 306 U/L — ABNORMAL HIGH (ref 39–117)
BUN: 113 mg/dL — ABNORMAL HIGH (ref 6–23)
CO2: 21 mEq/L (ref 19–32)
Calcium: 8.7 mg/dL (ref 8.4–10.5)
Chloride: 106 mEq/L (ref 96–112)
Creatinine, Ser: 4.08 mg/dL — ABNORMAL HIGH (ref 0.50–1.35)
GFR calc Af Amer: 16 mL/min — ABNORMAL LOW (ref >90)
GFR calc non Af Amer: 14 mL/min — ABNORMAL LOW (ref >90)
Glucose, Bld: 116 mg/dL — ABNORMAL HIGH (ref 70–99)
Potassium: 4.7 mEq/L (ref 3.7–5.3)
Sodium: 143 mEq/L (ref 137–147)
Total Bilirubin: 0.8 mg/dL (ref 0.3–1.2)
Total Protein: 6.6 g/dL (ref 6.0–8.3)

## 2013-09-12 LAB — PROTIME-INR
INR: 4.73 — ABNORMAL HIGH (ref 0.00–1.49)
Prothrombin Time: 42.6 s — ABNORMAL HIGH (ref 11.6–15.2)

## 2013-09-12 LAB — ABO/RH: ABO/RH(D): O POS

## 2013-09-12 MED ORDER — WARFARIN - PHARMACIST DOSING INPATIENT: Freq: Every day | Status: DC

## 2013-09-12 MED ORDER — VITAMIN K1 10 MG/ML IJ SOLN
2.5000 mg | Freq: Once | INTRAVENOUS | Status: AC
  Administered 2013-09-12: 2.5 mg via INTRAVENOUS
  Filled 2013-09-12: qty 0.25

## 2013-09-12 NOTE — Progress Notes (Signed)
Kerlix and ace wrap applied on both lower extremities; pt complaining of leg cramps on his right leg, initially loosened  the ace wrap but patient cannot tolerate and still complaining of cramps and refused his right leg to be wrapped.

## 2013-09-12 NOTE — Progress Notes (Signed)
[facility] PROGRESS NOTE    [name] [id number] DOB: 09-06-1946 DOA: 09/11/2013 PCP: Criselda Peaches, MD  HPI/Brief narrative 67 year old male with history of MS, HTN, dyslipidemia, hemispheric repair of type A aortic dissection with resuspension of the native aortic valve, chronic aortic dissection extending from visualized thoracic aorta into the right common iliac artery, anemia, EF 01/17/13 was 50-55%, nuclear stress test 2011 was considered low risk, recently seen by cardiology for worsening dyspnea, admitted on 09/11/13 with worsening leg edema. He apparently recently fell in the bathroom bruising the right side. Who on admission, he was found to be hypotensive, acute renal failure (creatinine 4.26), severely anemic (hemoglobin 5.9) & coagulopathy (INR: 5.19). [place] patient was admitted to the hospital for further evaluation and management. Cardiology is consulting  Assessment/Plan:  Anasarca/acute right-sided CHF/severe TR/moderate MR - Secondary to decompensated right-sided heart failure from severe tricuspid regurgitation. Diuretics have not been started due to hypotension last night and soft blood pressures this morning in the context of acute renal failure. Blood pressures however seem to be improving, probably from transfusion to. We'll defer diuretic management to cardiology. - Not felt to be a surgical candidate. Cardiology is reviewing options for second opinion regarding valve surgery.  Persistent A. Fib - Controlled ventricular rate. Coagulopathy. Cardiology is reversing INR with small dose of vitamin K. - Currently on no rate control medications. Consider resuming metoprolol as blood pressure tolerates.  Acute on chronic anemia - Unclear etiology. No overt blood loss. No reported melena or vomiting. - CT abdomen without contrast does not show retroperitoneal hematoma. - Status post 2 units PRBCs. Hemoglobin is improved from 5.9 > 7.7 g her DL. Follow  hemoglobin Q. 12 hourly and transfuse if less than 7 g per DL.  Acute renal failure - Likely multifactorial from hypotension, diuresis and ARB. These medications are on hold. - Improving. Follow BMP in a.m. If downward creatinine trend does not continue, consider nephrology consultation. No hydronephrosis seen on CT abdomen.  Hypotension - Management as above. Improving.  Multiple sclerosis - Neurology consultation appreciated. No change in medications.     Code Status: Full Family Communication: Discussed with spouse at bedside. Disposition Plan: To be determined.   Consultants:  Neurology  Cardiology  Procedures:  None  Antibiotics:  None   Subjective: Mild dyspnea. Denies chest pain or other complaints.  Objective: Filed Vitals:   09/12/13 0437 09/12/13 0537 09/12/13 0640 09/12/13 0651  BP: 102/55 106/52 104/56 106/55  Pulse: 87 94 88 85  Temp: 98.6 F (37 C) 98.7 F (37.1 C) 98.6 F (37 C) 98.6 F (37 C)  TempSrc: Oral Oral Oral Oral  Resp: 18 18 18 18   Height:      Weight:      SpO2: 98% 99% 100% 99%    Intake/Output Summary (Last 24 hours) at 09/12/13 1354 Last data filed at 09/12/13 1349  Gross per 24 hour  Intake 1287.09 ml  Output    600 ml  Net 687.09 ml   Filed Weights   09/11/13 1700  Weight: 86.5 kg (190 lb 11.2 oz)     Exam:  General exam: Moderately built and nourished male lying comfortably supine in bed. Respiratory system: Slightly reduced breath sounds in the bases with occasional basal crackles. No increased work of breathing. Cardiovascular system: S1 & S2 heard, irregularly irregular. No JVD, gallops, clicks. 1+ pitting bilateral leg edema extending to thighs. 3/6 systolic murmur at apex. Gastrointestinal system: Abdomen is mildly distended, soft and  nontender. Hepatomegaly appreciated-irregular, firm and nodular surface. Mild ascites. Normal bowel sounds heard. Mild patchy bruising mid right trunk area. Central nervous  system: Alert and oriented. No focal neurological deficits. Extremities: Symmetric 4 x 5 power upper extremity but reduced power in lower extremity.   Data Reviewed: Basic Metabolic Panel:  Recent Labs Lab 09/11/13 1940 09/12/13 0900  NA 140 143  K 5.3* 4.7  CL 104 106  CO2 20 21  GLUCOSE 94 116*  BUN 113* 113*  CREATININE 4.26* 4.08*  CALCIUM 8.8 8.7   Liver Function Tests:  Recent Labs Lab 09/11/13 1940 09/12/13 0900  AST 20 15  ALT 19 17  ALKPHOS 298* 306*  BILITOT 0.7 0.8  PROT 6.9 6.6  ALBUMIN 3.4* 3.2*   No results found for this basename: LIPASE, AMYLASE,  in the last 168 hours No results found for this basename: AMMONIA,  in the last 168 hours CBC:  Recent Labs Lab 09/11/13 1940 09/12/13 0900  WBC 6.8 7.9  HGB 5.9* 7.7*  HCT 17.9* 22.5*  MCV 83.6 81.8  PLT 145* 131*   Cardiac Enzymes: No results found for this basename: CKTOTAL, CKMB, CKMBINDEX, TROPONINI,  in the last 168 hours BNP (last 3 results)  Recent Labs  02/02/13 1655 09/11/13 1940  PROBNP 2576.00* 3366.0*   CBG: No results found for this basename: GLUCAP,  in the last 168 hours  No results found for this or any previous visit (from the past 240 hour(s)).     Studies: Ct Abdomen Pelvis Wo Contrast  09/12/2013   CLINICAL DATA:  Decreased hemoglobin. Recent fall. Evaluate for retroperitoneal hemorrhage.  EXAM: CT ABDOMEN AND PELVIS WITHOUT CONTRAST  TECHNIQUE: Multidetector CT imaging of the abdomen and pelvis was performed following the standard protocol without intravenous contrast.  COMPARISON:  01/06/2013 and 04/01/12  FINDINGS: No evidence of retroperitoneal hemorrhage. Increased size of large right pleural effusion is seen. There is also increased mild low-attenuation ascites and diffuse body wall edema.  Low-attenuation lesion in the anterior left hepatic lobe measuring 4.3 cm remains stable, and is likely benign. No other liver lesions identified in this noncontrast study.  Cholelithiasis again demonstrated. No evidence of acute cholecystitis. Bilateral renal cysts and tiny nonobstructive right renal calculi are stable. No evidence hydronephrosis. Adrenal glands, pancreas, and spleen have a normal appearance on this noncontrast study. No evidence of abdominal or pelvic lymphadenopathy. No evidence of focal inflammatory process. Small bilateral inguinal hernias seen both containing only fat.  IMPRESSION: No evidence of retroperitoneal hemorrhage.  Increased size of large right pleural effusion, mild ascites, and diffuse body wall edema.  Stable left hepatic lobe lesion, likely benign. Stable cholelithiasis and nonobstructive right nephrolithiasis.   Electronically Signed   By: Earle Gell M.D.   On: 09/12/2013 10:19   Dg Chest 2 View  09/12/2013   CLINICAL DATA:  History of hypertension and. Nonsmoker. Acute right-sided congestive heart failure.  EXAM: CHEST  2 VIEW  COMPARISON:  03/03/2013  FINDINGS: Stable appearance of mediastinal postoperative changes. Shallow inspiration with elevation of the right hemidiaphragm. Mild cardiac enlargement. No pulmonary vascular congestion or edema. Small bilateral pleural effusions. Basilar atelectasis. No focal consolidation. No pneumothorax. Calcified and tortuous aorta.  IMPRESSION: Cardiac enlargement without significant vascular congestion or edema. Small bilateral pleural effusions and basilar atelectasis.   Electronically Signed   By: Lucienne Capers M.D.   On: 09/12/2013 06:23        Scheduled Meds: . atorvastatin  10 mg Oral q1800  .  dalfampridine  10 mg Oral BID  . Dimethyl Fumarate  1 capsule Oral BID  . folic acid  1 mg Oral Daily  . Warfarin - Pharmacist Dosing Inpatient   Does not apply q1800   Continuous Infusions: . 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    Vernell Leep, MD, FACP, FHM. [facility] Pager [phone]  If 7PM-7AM, please contact night-coverage [email or web address] Password [email or web address] 09/12/2013, 1:54 PM    LOS: 1 day

## 2013-09-12 NOTE — Progress Notes (Signed)
Utilization review completed. Sarie Stall, RN, BSN. 

## 2013-09-12 NOTE — Progress Notes (Signed)
Subjective:  SOB at reset in bed  Objective:  Vital Signs in the last 24 hours: Temp:  [97.4 F (36.3 C)-98.9 F (37.2 C)] 98.6 F (37 C) (12/30 0651) Pulse Rate:  [73-95] 85 (12/30 0651) Resp:  [17-19] 18 (12/30 0651) BP: (84-156)/(36-100) 106/55 mmHg (12/30 0651) SpO2:  [97 %-100 %] 99 % (12/30 0651) Weight:  [190 lb 11.2 oz (86.5 kg)] 190 lb 11.2 oz (86.5 kg) (12/29 1700)  Intake/Output from previous day:  Intake/Output Summary (Last 24 hours) at 09/12/13 [id number] Last data filed at 09/12/13 [id number]  Gross per 24 hour  Intake 827.09 ml  Output    200 ml  Net 627.09 ml    Physical Exam: General appearance: alert, cooperative and mild distress Neck: marked JVD Lungs: decreased at bases Heart: irregularly irregular rhythm and 2/6 sytolic murmur Abdomen: echymosis Rt flank, hepatomegaly   Rate: 86  Rhythm: atrial fibrillation  Lab Results:  Recent Labs  09/11/13 1940  WBC 6.8  HGB 5.9*  PLT 145*    Recent Labs  09/11/13 1940  NA 140  K 5.3*  CL 104  CO2 20  GLUCOSE 94  BUN 113*  CREATININE 4.26*   No results found for this basename: TROPONINI, CK, MB,  in the last 72 hours  Recent Labs  09/11/13 2011  INR 5.19*    Imaging: Imaging results have been reviewed  Cardiac Studies:  Assessment/Plan:   Principal Problem:   Acute right-sided congestive heart failure Active Problems:   Anemia due to blood loss, acute in setting of supratheraputic INR 09/11/13   Acute renal insufficiency   Aortic dissection- chronic abd dissection    MS (multiple sclerosis)   Persistent atrial fibrillation   Severe tricuspid valve regurgitation   Dissection of aorta, thoracic- surgery Feb 2000   Long term (current) use of anticoagulants   Bilateral leg edema   Unstable gait   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 Pt was transfused overnight, labs pending.  Consider abdominal CT- he fell in his bathroom at home this week and has  Rt sided ecchymosis. Hepatomegaly noted on exam - chronic secondary to TR vs acute.   Kerin Ransom PA-C [facility] [id number] 09/12/2013, 9:29 AM

## 2013-09-12 NOTE — Progress Notes (Signed)
Pt. Seen and examined. Agree with the NP/PA-C note as written.  Labs became available after I examined him last night. There is severe anemia and he has extensive ecchymosis, including flank ecchymosis - possible from recent fall and supratherapeutic INR. Would reverse warfarin due to passive liver congestion - give IV vitamin K 2.5 mg x 1 today. Check non-contrast CT abdomen/pelvis. Would hold off on lasix gtts until his H/H has been restored and stabilized and there are signs that he is no longer pre-renal.   Pixie Casino, MD, [facility] Attending Cardiologist [place]

## 2013-09-12 NOTE — Progress Notes (Signed)
NEURO HOSPITALIST PROGRESS NOTE   SUBJECTIVE:                                                                                                                        Patient has no new complaints.   OBJECTIVE:                                                                                                                           Vital signs in last 24 hours: Temp:  [97.4 F (36.3 C)-98.9 F (37.2 C)] 98.6 F (37 C) (12/30 1525) Pulse Rate:  [73-101] 101 (12/30 1525) Resp:  [17-19] 18 (12/30 1525) BP: (88-156)/(42-100) 96/52 mmHg (12/30 1525) SpO2:  [97 %-100 %] 99 % (12/30 1525)  Intake/Output from previous day: 12/29 0701 - 12/30 0700 In: 827.1 [P.O.:240; Blood:587.1] Out: 200 [Urine:200] Intake/Output this shift: Total I/O In: 460 [P.O.:460] Out: 400 [Urine:400] Nutritional status: Cardiac  Past Medical History  Diagnosis Date  . Neuromuscular disorder   . Hypertension   . Anemia   . Depression   . Multiple sclerosis   . Thrombocytopenia   . Dyslipidemia   . Aortic dissection, thoracic   . BPH (benign prostatic hyperplasia)   . Hemorrhoids   . Aortic dissection 11/05/1998    S/P emergency repair of acute type A aortic dissection with resuspension of native aortic valve  . 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 Chest pain 11/18/2009    2D Echo - EF >55%, right ventricle is moderately dilated, right atrium is severely dilated, moderate-severe tricuspid regurgitation; R/P Myoview - EF 56%, no scintigraphic evidence of inducible myocardial ischemia, EKG negative for ischemia,   . CHF (congestive heart failure) 12[birth year]05[birth year]    2D Echo - EF 50-55%, mild-moderately dilated right ventricle, mild-moderate tricuspid valve regurgitation, moderately dilated right atrium  . Bilateral lower extremity edema   . Atrial  fibrillation 01/30/2013    Atrial fibrillation   . Pleural effusion, right, large 01/30/2013  . Severe tricuspid valve regurgitation 01/30/2013    Severe tricuspid regurg by recent 2-D echo with a dilated tricuspid annulus, moderate pulmonary hypertension and  biatrial enlargement   . Mitral regurgitation 02/20/2013  . Lower extremity edema 02/20/2013  . Heart murmur   . Exertional shortness of breath     "for awhile now" (09/11/2013)  . History of blood transfusion     "related to bleeding rectally"      Neurologic Exam:  Mental Status:  Alert, oriented x3, thought content appropriate.  Speech fluent without evidence of aphasia. Able to follow commands without difficulty.  Cranial Nerves:  II-Visual fields were normal.  III/IV/VI-Pupils were equal and reacted. Extraocular movements were full and conjugate. At rest he showed disconjugate gaze V/VII-no facial numbness and no facial weakness.  VIII-normal.  X-normal speech and symmetrical palatal movement.  XII-midline tongue extension  Motor: Moderately severe proximal weakness involving right lower extremity with mild weakness distally; mild to moderate left hip flexor and hamstring weakness with minimal quadriceps weakness. Right lower extremity strength distally was normal. Muscle tone was increased in lower extremities, right greater than left.  Sensory: Normal throughout.  Deep Tendon Reflexes: 3+ and symmetric.  Plantars: Flexor bilaterally  Cerebellar: Mild bilateral intention tremor with finger-to-nose testing.   Lab Results: No results found for this basename: cbc, bmp, coags, chol, tri, ldl, hga1c   Lipid Panel No results found for this basename: CHOL, TRIG, HDL, CHOLHDL, VLDL, LDLCALC,  in the last 72 hours  Studies/Results: Ct Abdomen Pelvis Wo Contrast  09/12/2013   CLINICAL DATA:  Decreased hemoglobin. Recent fall. Evaluate for retroperitoneal hemorrhage.  EXAM: CT ABDOMEN AND PELVIS WITHOUT CONTRAST  TECHNIQUE:  Multidetector CT imaging of the abdomen and pelvis was performed following the standard protocol without intravenous contrast.  COMPARISON:  01/06/2013 and 04/01/12  FINDINGS: No evidence of retroperitoneal hemorrhage. Increased size of large right pleural effusion is seen. There is also increased mild low-attenuation ascites and diffuse body wall edema.  Low-attenuation lesion in the anterior left hepatic lobe measuring 4.3 cm remains stable, and is likely benign. No other liver lesions identified in this noncontrast study. Cholelithiasis again demonstrated. No evidence of acute cholecystitis. Bilateral renal cysts and tiny nonobstructive right renal calculi are stable. No evidence hydronephrosis. Adrenal glands, pancreas, and spleen have a normal appearance on this noncontrast study. No evidence of abdominal or pelvic lymphadenopathy. No evidence of focal inflammatory process. Small bilateral inguinal hernias seen both containing only fat.  IMPRESSION: No evidence of retroperitoneal hemorrhage.  Increased size of large right pleural effusion, mild ascites, and diffuse body wall edema.  Stable left hepatic lobe lesion, likely benign. Stable cholelithiasis and nonobstructive right nephrolithiasis.   Electronically Signed   By: Earle Gell M.D.   On: 09/12/2013 10:19   Dg Chest 2 View  09/12/2013   CLINICAL DATA:  History of hypertension and. Nonsmoker. Acute right-sided congestive heart failure.  EXAM: CHEST  2 VIEW  COMPARISON:  03/03/2013  FINDINGS: Stable appearance of mediastinal postoperative changes. Shallow inspiration with elevation of the right hemidiaphragm. Mild cardiac enlargement. No pulmonary vascular congestion or edema. Small bilateral pleural effusions. Basilar atelectasis. No focal consolidation. No pneumothorax. Calcified and tortuous aorta.  IMPRESSION: Cardiac enlargement without significant vascular congestion or edema. Small bilateral pleural effusions and basilar atelectasis.    Electronically Signed   By: Lucienne Capers M.D.   On: 09/12/2013 06:23    MEDICATIONS  Scheduled: . atorvastatin  10 mg Oral q1800  . dalfampridine  10 mg Oral BID  . Dimethyl Fumarate  1 capsule Oral BID  . folic acid  1 mg Oral Daily  . Warfarin - Pharmacist Dosing Inpatient   Does not apply q1800    ASSESSMENT/PLAN:                                                                                                            67 year old man with multiple sclerosis with progressive gait instability and lower extremity weakness.  His LE weakness is  LE weakness likely multifactorial including increasing LE edema. No clear acute worsenings to suggest a relapse that would benefit from steroids. At this time patient is unable to obtain MRI cervical and thoracic spine with contrast due to renal function, and I do not think a non-contrasted MRI would be very helpful. I would continue his home medications and continue to work with PT/OT  Recommend: 1) Continue PT/OT 2) At this time neurology will sign off, please call if any further questions or concerns remain.   Assessment and plan discussed with with attending physician and they are in agreement.    Etta Quill PA-C [facility] [phone]  09/12/2013, 5:22 PM  I have seen and evaluated the patient. I have reviewed the above note and made appropriate changes. Progressive gait difficulty, no clear sign of flair. Would hold off on steroids for now. Continue home meds.   Roland Rack, MD [facility] [phone]  If 7pm- 7am, please page neurology on call at [phone].

## 2013-09-12 NOTE — Care Management Note (Addendum)
  Page 2 of 2   09/18/2013     2:11:20 PM   CARE MANAGEMENT NOTE 09/18/2013  Patient:  [name], [name]   Account Number:  [id number]  Date Initiated:  09/12/2013  Documentation initiated by:  Ilina Xu  Subjective/Objective Assessment:   Adm with CHF tricuspid insufficiency, MS     Action/Plan:   CM will monitor for dispositon needs   Anticipated DC Date:  09/12/2013   Anticipated DC Plan:  [place]  CM consult      [facility] Choice  HOME HEALTH   Choice offered to / List presented to:  C-1 Patient        [place] arranged  HH-1 RN  [place]      [place]   Status of service:  Completed, signed off Medicare Important Message given?   (If response is "NO", the following Medicare IM given date fields will be blank) Date Medicare IM given:   Date Additional Medicare IM given:    Discharge Disposition:  [place]  Per UR Regulation:  Reviewed for med. necessity/level of care/duration of stay  If discussed at [place] of Stay Meetings, dates discussed:    Comments:  09/18/2013 Disposition:  Patient and wife elect HHS rather than SNF Elects [facility] HHS:  RN, PT, OT, HHA - order  called to [employer] RN, BSN, MSHL, CCM 09/18/2013  09/15/2013 PT Recs:  CIR OT Recs:  Pending Milaina Sher RN, BSN, MSHL, CCM 09/15/2013  09/12/2013  CM Consult: Wife/Debbie verbalized concerns with d/c to home d/t hx/o several falls requiring calling 911 and neighbors to assist.  Wife states she is not able to get patient up out of the floor or provide the level of care needs patient requires.  Wife is interested in education on [place]. CM provided Ed on HHS, PCS services (which require MCD), private pay, SNF, LTC coverage CM instructed it is sometimes necessary to transiton to SNF post d/c prior to a new assessemnt to determine if patient is able to  return to home and be safe in the home. CM confirmed patient has LTC coverage.  CM discussed that sometimes policy requires patient to be IP SNF before benefit will pay but to get policy out and review details or contact policy provider to answer any policy questions they may have.  CM advised that policy would need to be reviewed to determine if patient has any benefit for LTC in the home. ADD:  pending Disposition:  pending (No PT evalu completed yet).  Kawika Bischoff v SNF. Vickie Ponds RN, BSN, [place], CCM 09/12/2013

## 2013-09-12 NOTE — Consult Note (Signed)
WOC wound consult note Reason for Consult: Edema bilateral lower extremities Wound type: Impaired renal function, edema bilateral lower extremities Measurement:generalized lower extremities Wound [id number] Drainage (amount, consistency, odor) none Periwound: none Dressing procedure/placement/frequency:Apply gauze wrap from first metatarsal head to just below patellar notch to bilateral legs  Secure with ace wrap and tape to manage edema.  Change twice weekly.  WOC nursing will not follow at this time.  Please re-consult if needed.  Domenic Moras RN BSN [place] Pager [phone]

## 2013-09-12 NOTE — Progress Notes (Signed)
ANTICOAGULATION CONSULT NOTE - Follow Up Consult  Pharmacy Consult for warfarin Indication: atrial fibrillation  No Known Allergies  Patient Measurements: Height: 5\' 8"  (172.7 cm) Weight: 190 lb 11.2 oz (86.5 kg) IBW/kg (Calculated) : 68.4   Vital Signs: Temp: 98.6 F (37 C) (12/30 0651) Temp src: Oral (12/30 0651) BP: 106/55 mmHg (12/30 0651) Pulse Rate: 85 (12/30 0651)  Labs:  Recent Labs  09/11/13 1940 09/11/13 2011 09/12/13 0900  HGB 5.9*  --  7.7*  HCT 17.9*  --  22.5*  PLT 145*  --  131*  LABPROT  --  45.7* 42.6*  INR  --  5.19* 4.73*  CREATININE 4.26*  --  4.08*    Estimated Creatinine Clearance: 19 ml/min (by C-G formula based on Cr of 4.08).   Medications:  Scheduled:  . atorvastatin  10 mg Oral q1800  . dalfampridine  10 mg Oral BID  . Dimethyl Fumarate  1 capsule Oral BID  . folic acid  1 mg Oral Daily  . phytonadione (VITAMIN K) IV  2.5 mg Intravenous Once    Assessment: 75 YOM with MS who was admitted with a SUPRAtherapeutic INR of 5.19. He received vitamin K 2.5mg  IV x1 last night. INR this morning is still quite elevated at 4.73. Cards has ordered another 2.5mg  IV of vitamin K. Has ecchymosis and low Hgb (5.9 on admission, now 7.7 after transfusion), platelets low at 131.  Goal of Therapy:  INR 2-3 Monitor platelets by anticoagulation protocol: Yes   Plan:  1. Hold warfarin 2. Daily PT/INR  Sherard Sutch D. Sandeep Radell, PharmD, BCPS Clinical Pharmacist Pager: [phone] 09/12/2013 10:51 AM

## 2013-09-13 LAB — PREPARE RBC (CROSSMATCH)

## 2013-09-13 LAB — CBC
HCT: 21.1 % — ABNORMAL LOW (ref 39.0–52.0)
HCT: 24.9 % — ABNORMAL LOW (ref 39.0–52.0)
Hemoglobin: 7 g/dL — ABNORMAL LOW (ref 13.0–17.0)
Hemoglobin: 8.1 g/dL — ABNORMAL LOW (ref 13.0–17.0)
MCH: 27.3 pg (ref 26.0–34.0)
MCH: 27.4 pg (ref 26.0–34.0)
MCHC: 33.2 g/dL (ref 30.0–36.0)
MCHC: 32.5 g/dL (ref 30.0–36.0)
MCV: 82.4 fL (ref 78.0–100.0)
MCV: 84.1 fL (ref 78.0–100.0)
Platelets: 138 10*3/uL — ABNORMAL LOW (ref 150–400)
Platelets: 146 10*3/uL — ABNORMAL LOW (ref 150–400)
RBC: 2.56 MIL/uL — ABNORMAL LOW (ref 4.22–5.81)
RBC: 2.96 MIL/uL — ABNORMAL LOW (ref 4.22–5.81)
RDW: 16 % — ABNORMAL HIGH (ref 11.5–15.5)
RDW: 15.7 % — ABNORMAL HIGH (ref 11.5–15.5)
WBC: 6.4 10*3/uL (ref 4.0–10.5)
WBC: 6.8 10*3/uL (ref 4.0–10.5)

## 2013-09-13 LAB — BASIC METABOLIC PANEL
BUN: 112 mg/dL — ABNORMAL HIGH (ref 6–23)
CO2: 21 mEq/L (ref 19–32)
Calcium: 8.4 mg/dL (ref 8.4–10.5)
Chloride: 110 mEq/L (ref 96–112)
Creatinine, Ser: 3.46 mg/dL — ABNORMAL HIGH (ref 0.50–1.35)
GFR calc Af Amer: 20 mL/min — ABNORMAL LOW (ref >90)
GFR calc non Af Amer: 17 mL/min — ABNORMAL LOW (ref >90)
Glucose, Bld: 93 mg/dL (ref 70–99)
Potassium: 4.9 mEq/L (ref 3.7–5.3)
Sodium: 145 mEq/L (ref 137–147)

## 2013-09-13 LAB — PROTIME-INR
INR: 2.02 — ABNORMAL HIGH (ref 0.00–1.49)
Prothrombin Time: 22.2 seconds — ABNORMAL HIGH (ref 11.6–15.2)

## 2013-09-13 LAB — PRO B NATRIURETIC PEPTIDE: Pro B Natriuretic peptide (BNP): 2647 pg/mL — ABNORMAL HIGH (ref 0–125)

## 2013-09-13 MED ORDER — WARFARIN SODIUM 1 MG PO TABS
1.0000 mg | ORAL_TABLET | Freq: Once | ORAL | Status: AC
  Administered 2013-09-13: 1 mg via ORAL
  Filled 2013-09-13: qty 1

## 2013-09-13 NOTE — Progress Notes (Signed)
ANTICOAGULATION CONSULT NOTE - Follow Up Consult  Pharmacy Consult for warfarin Indication: atrial fibrillation  No Known Allergies  Patient Measurements: Height: 5\' 8"  (172.7 cm) Weight: 188 lb 4.4 oz (85.4 kg) IBW/kg (Calculated) : 68.4   Vital Signs: Temp: 97.1 F (36.2 C) (12/31 0634) Temp src: Oral (12/31 0634) BP: 112/43 mmHg (12/31 0634) Pulse Rate: 89 (12/31 0634)  Labs:  Recent Labs  09/11/13 1940 09/11/13 2011 09/12/13 0900 09/12/13 1858 09/13/13 0515  HGB 5.9*  --  7.7* 7.2* 7.0*  HCT 17.9*  --  22.5* 22.0* 21.1*  PLT 145*  --  131* 136* 138*  LABPROT  --  45.7* 42.6*  --  22.2*  INR  --  5.19* 4.73*  --  2.02*  CREATININE 4.26*  --  4.08*  --  3.46*    Estimated Creatinine Clearance: 22.3 ml/min (by C-G formula based on Cr of 3.46).   Medications:  Scheduled:  . atorvastatin  10 mg Oral q1800  . dalfampridine  10 mg Oral BID  . Dimethyl Fumarate  1 capsule Oral BID  . folic acid  1 mg Oral Daily  . Warfarin - Pharmacist Dosing Inpatient   Does not apply q1800    Assessment: 4 YOM with MS who was admitted with a SUPRAtherapeutic INR of 5.19. He received vitamin K 2.5mg  IV x1 on admit and another 2.5mg  IV x1 yesterday. INR this morning down to therapeutic range at 2.02.  Has ecchymosis and low Hgb (5.9 on admission, now 7), platelets low at 138. No overt bleeding noted. Spoke with Cecilie Kicks from cardiology about resuming warfarin tonight as patient may get another transfusion today. She ok'd for a dose to be entered.  Goal of Therapy:  INR 2-3 Monitor platelets by anticoagulation protocol: Yes   Plan:  1. Warfarin 1mg  po x1 tonight  2. Daily PT/INR 3. Follow very closely for s/s bleeding and changes to CBC  Analiyah Lechuga D. Tiffaney Heimann, PharmD, BCPS Clinical Pharmacist Pager: [phone] 09/13/2013 10:01 AM

## 2013-09-13 NOTE — Evaluation (Signed)
Physical Therapy Evaluation Patient Details Name: [name] MRN: [id number] DOB: 1946/07/05 Today's Date: 09/13/2013 Time: [id number] PT Time Calculation (min): 6 min  PT Assessment / Plan / Recommendation History of Present Illness  [name] is an 67 y.o. male with a history of multiple sclerosis, hypertension, dyslipidemia, congestive heart failure, atrial fibrillation on anticoagulation, and mitral regurgitation was admitted with increasing edema involving lower extremities. Neurology consultation was obtained because of increasing problems with his gait. He's become increasingly unstable and has experienced multiple falls over the past several weeks. He has resorted to using a rolling walker over the past several days with no recurrent falls. Previously he had been using a cane   Clinical Impression  Pt admitted with aobve. Pt currently with functional limitations due to the deficits listed below (see PT Problem List).  Pt will benefit from skilled PT to increase their independence and safety with mobility to allow discharge to the venue listed below.   Pt very much wishes to dc home, but he is open to consideration of CIR;  recommend comprehensive inpatient rehab (CIR) for post-acute therapy needs to maximize independence and safety with mobility and ADLs, and facilitate safe return home and return to work.        PT Assessment  Patient needs continued PT services    Follow Up Recommendations  CIR;Supervision/Assistance - 24 hour    Does the patient have the potential to tolerate intense rehabilitation      Barriers to Discharge Decreased caregiver support Not sure exactly how much wife can assist pt physically    Equipment Recommendations  Other (comment);3in1 (PT) (TBD)    Recommendations for Other Services Rehab consult;OT consult   Frequency Min 3X/week    Precautions / Restrictions Precautions Precautions: Fall   Pertinent Vitals/Pain no apparent  distress       Mobility  Bed Mobility Bed Mobility: Supine to Sit;Sitting - Scoot to Edge of Bed Supine to Sit: 4: Min assist Sitting - Scoot to [employer] of Bed: 4: Min assist;With rail Details for Bed Mobility Assistance: Slower, inefficient motion Transfers Transfers: Sit to Stand;Stand to Sit Sit to Stand: 4: Min guard;From bed;From chair/3-in-1;3: Mod assist Stand to Sit: 4: Min guard;To toilet;To chair/3-in-1 Details for Transfer Assistance: Required mod assist for sit to stand from bed, including rocking motion, with pt dependeing on momentum for successful sit to stand; Better performance with rise from and sit to seat with armrests; Cues for safe hand placement as pt tends to pull up on rollator Ambulation/Gait Ambulation/Gait Assistance: 3: Mod assist;4: Min assist Ambulation Distance (Feet): 8 Feet Assistive device: 4-wheeled walker Ambulation/Gait Assistance Details: Quite unsteady on feet with heavy dependence on UE support Gait Pattern: Decreased stride length;Left genu recurvatum;Shuffle;Ataxic (with erratic step width)    Exercises     PT Diagnosis: Difficulty walking;Abnormality of gait;Generalized weakness  PT Problem List: Decreased strength;Decreased range of motion;Decreased activity tolerance;Decreased balance;Decreased mobility;Decreased coordination;Decreased cognition;Decreased knowledge of use of DME;Decreased safety awareness;Impaired tone PT Treatment Interventions: DME instruction;Gait training;Functional mobility training;Therapeutic activities;Therapeutic exercise;Balance training;Neuromuscular re-education;Patient/family education     PT Goals(Current goals can be found in the care plan section) Acute Rehab PT Goals Patient Stated Goal: to go home, back to work PT Goal Formulation: With patient Time For Goal Achievement: 09/27/13 Potential to Achieve Goals: Good  Visit Information  Last PT Received On: 09/13/13 Assistance Needed: +2 (helpful for  safety, push chair) History of Present Illness: [name] is an 67 y.o. male with  a history of multiple sclerosis, hypertension, dyslipidemia, congestive heart failure, atrial fibrillation on anticoagulation, and mitral regurgitation was admitted with increasing edema involving lower extremities. Neurology consultation was obtained because of increasing problems with his gait. He's become increasingly unstable and has experienced multiple falls over the past several weeks. He has resorted to using a rolling walker over the past several days with no recurrent falls. Previously he had been using a cane       Prior [place] expects to be discharged to:: Private residence Living Arrangements: Spouse/significant other Available Help at Discharge: Family;Available PRN/intermittently (not sure how much physical assist wife can give) Type of Home: House Home Access: Ramped entrance Home Layout: Two level Alternate Level Stairs-Number of Steps:  (stair chair lift) Home Equipment: Walker - 4 wheels;Cane - single point;Other (comment) (stair lift) Prior Function Level of Independence: Independent with assistive device(s) (though with 4 falls in past year) Communication Communication: No difficulties (tangetial)    Cognition  Cognition Arousal/Alertness: Awake/alert Behavior During Therapy: WFL for tasks assessed/performed Overall Cognitive Status: Impaired/Different from baseline Area of Impairment: Safety/judgement;Awareness Safety/Judgement: Decreased awareness of safety;Decreased awareness of deficits General Comments: 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[employer]), when during this session he needed assist for balance and safety and ADLs    Extremity/Trunk Assessment Upper Extremity Assessment Upper Extremity Assessment: Generalized weakness Lower Extremity Assessment Lower Extremity Assessment: Generalized weakness;RLE  deficits/detail;LLE deficits/detail RLE Deficits / Details: Proximal weakness, decr quad cantractio with standing -- knee tends to be slightly flexed in stance, lending to instability LLE Deficits / Details: Heavily dependent on UEs for sit to stand LLE Coordination: decreased gross motor   Balance    End of Session PT - End of Session Equipment Utilized During Treatment: Gait belt Activity Tolerance: Patient tolerated treatment well Patient left: in chair;with call bell/phone within reach Nurse Communication: Mobility status  GP     [name], [place]  09/13/2013, 4:01 PM

## 2013-09-13 NOTE — Progress Notes (Signed)
Subjective: No chest pain, only SOB with walking, asking when he can go home  Objective: Vital signs in last 24 hours: Temp:  [97.1 F (36.2 C)-98.6 F (37 C)] 97.1 F (36.2 C) (12/31 0634) Pulse Rate:  [80-101] 89 (12/31 0634) Resp:  [18-22] 21 (12/31 0634) BP: (82-112)/(43-52) 112/43 mmHg (12/31 0634) SpO2:  [99 %] 99 % (12/31 0634) Weight:  [188 lb 4.4 oz (85.4 kg)] 188 lb 4.4 oz (85.4 kg) (12/31 0634) Weight change: -2 lb 6.8 oz (-1.1 kg) Last BM Date: 09/11/13 Intake/Output from previous day: +400 (+1027 since admit)   Wt 188 down from 190 on admit 12/30 0701 - 12/31 0700 In: 800 [P.O.:800] Out: 400 [Urine:400] Intake/Output this shift:    PE: General:Pleasant affect, NAD Skin:Warm and dry, brisk capillary refill HEENT:normocephalic, sclera clear, mucus membranes moist Heart:irreg irreg  With systolic murmur, no gallup, rub or click Lungs:clear without rales, rhonchi, or wheezes [id number], non tender, + BS, do not palpate liver spleen or masses Ext:+ lower ext edema,  Neuro:alert and oriented, MAE, follows commands, + facial symmetry   Lab Results:  Recent Labs  09/12/13 1858 09/13/13 0515  WBC 8.4 6.4  HGB 7.2* 7.0*  HCT 22.0* 21.1*  PLT 136* 138*   BMET  Recent Labs  09/12/13 0900 09/13/13 0515  NA 143 145  K 4.7 4.9  CL 106 110  CO2 21 21  GLUCOSE 116* 93  BUN 113* 112*  CREATININE 4.08* 3.46*  CALCIUM 8.7 8.4    Hepatic Function Panel  Recent Labs  09/12/13 0900  PROT 6.6  ALBUMIN 3.2*  AST 15  ALT 17  ALKPHOS 306*  BILITOT 0.8   No results found for this basename: CHOL,  in the last 72 hours No results found for this basename: PROTIME,  in the last 72 hours  BNP (last 3 results)  Recent Labs  02/02/13 1655 09/11/13 1940 09/13/13 0515  PROBNP 2576.00* 3366.0* 2647.0*     Studies/Results: Ct Abdomen Pelvis Wo Contrast  09/12/2013   CLINICAL DATA:  Decreased hemoglobin. Recent fall. Evaluate for  retroperitoneal hemorrhage.  EXAM: CT ABDOMEN AND PELVIS WITHOUT CONTRAST  TECHNIQUE: Multidetector CT imaging of the abdomen and pelvis was performed following the standard protocol without intravenous contrast.  COMPARISON:  01/06/2013 and 04/01/12  FINDINGS: No evidence of retroperitoneal hemorrhage. Increased size of large right pleural effusion is seen. There is also increased mild low-attenuation ascites and diffuse body wall edema.  Low-attenuation lesion in the anterior left hepatic lobe measuring 4.3 cm remains stable, and is likely benign. No other liver lesions identified in this noncontrast study. Cholelithiasis again demonstrated. No evidence of acute cholecystitis. Bilateral renal cysts and tiny nonobstructive right renal calculi are stable. No evidence hydronephrosis. Adrenal glands, pancreas, and spleen have a normal appearance on this noncontrast study. No evidence of abdominal or pelvic lymphadenopathy. No evidence of focal inflammatory process. Small bilateral inguinal hernias seen both containing only fat.  IMPRESSION: No evidence of retroperitoneal hemorrhage.  Increased size of large right pleural effusion, mild ascites, and diffuse body wall edema.  Stable left hepatic lobe lesion, likely benign. Stable cholelithiasis and nonobstructive right nephrolithiasis.   Electronically Signed   By: Earle Gell M.D.   On: 09/12/2013 10:19   Dg Chest 2 View  09/12/2013   CLINICAL DATA:  History of hypertension and. Nonsmoker. Acute right-sided congestive heart failure.  EXAM: CHEST  2 VIEW  COMPARISON:  03/03/2013  FINDINGS: Stable  appearance of mediastinal postoperative changes. Shallow inspiration with elevation of the right hemidiaphragm. Mild cardiac enlargement. No pulmonary vascular congestion or edema. Small bilateral pleural effusions. Basilar atelectasis. No focal consolidation. No pneumothorax. Calcified and tortuous aorta.  IMPRESSION: Cardiac enlargement without significant vascular  congestion or edema. Small bilateral pleural effusions and basilar atelectasis.   Electronically Signed   By: Lucienne Capers M.D.   On: 09/12/2013 06:23    Medications: I have reviewed the patient's current medications. Scheduled Meds: . atorvastatin  10 mg Oral q1800  . dalfampridine  10 mg Oral BID  . Dimethyl Fumarate  1 capsule Oral BID  . folic acid  1 mg Oral Daily  . Warfarin - Pharmacist Dosing Inpatient   Does not apply q1800   Continuous Infusions: . sodium chloride     PRN Meds:.acetaminophen, acetaminophen, docusate sodium, QUEtiapine   Assessment/Plan: Principal Problem:   Anasarca Active Problems:   Aortic dissection- chronic abd dissection    MS (multiple sclerosis)   Persistent atrial fibrillation   Severe tricuspid valve regurgitation   Dissection of aorta, thoracic- surgery Feb 2000   Long term (current) use of anticoagulants   Bilateral leg edema   Acute right-sided congestive heart failure   Unstable gait   Anemia due to blood loss, acute in setting of supratheraputic INR 09/11/13   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   Acute renal insufficiency   Anemia  PLAN: H/H continues to decrease.  Cr. Improving,  Pro BNP improving.  Was markedly volume-overloaded secondary to acute congestive right-sided failure, owing to severe tricuspid regurgitation on admit and significant anemia.  Have not been able to diuresis due to hypotension. BP now labile from 82 systolic to [id number]. Atrial fib rate controlled.  ? Transfuse.   LOS: 2 days   Time spent with pt. :15 minutes. [facility]  Nurse Practitioner Certified Pager [phone] or after 5pm and on weekends call [phone] 09/13/2013, 7:45 AM    Patient seen and examined. Agree with assessment and plan. Breathing better, less edema. H/Hct 7.0/21.1; consider PRBC transfusion today. Cr slightly better at 3.46 decreased from 4.08 yesterday. AF rate controlled.   Troy Sine, MD,  [facility] 09/13/2013 9:38 AM

## 2013-09-13 NOTE — Progress Notes (Signed)
Rehab Admissions Coordinator Note:  Patient was screened by Retta Diones for appropriateness for an Inpatient Acute Rehab Consult.  At this time, we are recommending Inpatient Rehab consult.  Retta Diones 09/13/2013, 4:51 PM  I can be reached at [phone].

## 2013-09-13 NOTE — Progress Notes (Signed)
[facility] PROGRESS NOTE  [name] [id number] DOB: [birth year] DOA: 09/11/2013 PCP: Criselda Peaches, MD  HPI/Brief narrative 68 year old male with history of MS, HTN, dyslipidemia, hemispheric repair of type A aortic dissection with resuspension of the native aortic valve, chronic aortic dissection extending from visualized thoracic aorta into the right common iliac artery, anemia, EF 01/17/13 was 50-55%, nuclear stress test 2011 was considered low risk, recently seen by cardiology for worsening dyspnea, admitted on 09/11/13 with worsening leg edema. He apparently recently fell in the bathroom bruising the right side. Who on admission, he was found to be hypotensive, acute renal failure (creatinine 4.26), severely anemic (hemoglobin 5.9) & coagulopathy (INR: 5.19). [place] patient was admitted to the hospital for further evaluation and management. Cardiology is consulting  Assessment/Plan: Acute on chronic anemia - Unclear etiology. No overt blood loss. No reported melena or vomiting. - CT abdomen without contrast does not show retroperitoneal hematoma. - Status post 2 units PRBCs. Hemoglobin is improved from 5.9 > 7.7 g her DL. Follow hemoglobin Q. 12 hourly and transfuse if less than 7 g per DL. - Hb 7.0 12/31, will transfuse 1 additional unit. Check FOBT, anemia panel, LDH, haptoglobin. Iron was < 10 in 2009.  Anasarca/acute right-sided CHF/severe TR/moderate MR - Secondary to decompensated right-sided heart failure from severe tricuspid regurgitation. Diuretics have not been started due to relative hypotension and soft blood pressures this morning in the context of acute renal failure. Will defer diuretic management to cardiology. - Not felt to be a surgical candidate. Cardiology is reviewing options for second opinion regarding valve surgery. Persistent A. Fib - Controlled ventricular rate.  - Currently on no rate control medications. Consider resuming metoprolol as blood  pressure tolerates. Acute renal failure- Likely multifactorial from hypotension, diuresis and ARB. These medications are on hold. - Improving. Follow BMP in a.m. If downward creatinine trend does not continue, consider nephrology consultation. No hydronephrosis seen on CT abdomen. Hypotension- Management as above. Improving. Multiple sclerosis- Neurology consultation appreciated. No change in medications.  Code Status: Full Family Communication: Discussed with spouse at bedside. Disposition Plan: To be determined.   Consultants:  Neurology  Cardiology  Procedures:  None  Antibiotics:  None   Subjective: - no complaints, denies chest pain. Asks about going home.   Objective: Filed Vitals:   09/13/13 0634 09/13/13 1334 09/13/13 1530 09/13/13 1545  BP: 112/43 107/50 89/56 97/55   Pulse: 89 92 89 94  Temp: 97.1 F (36.2 C) 98.7 F (37.1 C) 98.7 F (37.1 C) 98.1 F (36.7 C)  TempSrc: Oral Oral Oral   Resp: 21 20 20 20   Height:      Weight: 85.4 kg (188 lb 4.4 oz)     SpO2: 99% 99% 84%     Intake/Output Summary (Last 24 hours) at 09/13/13 1633 Last data filed at 09/13/13 1334  Gross per 24 hour  Intake    820 ml  Output      0 ml  Net    820 ml   Filed Weights   09/11/13 1700 09/13/13 0634  Weight: 86.5 kg (190 lb 11.2 oz) 85.4 kg (188 lb 4.4 oz)   Exam: General exam: Moderately built and nourished male lying comfortably supine in bed. Respiratory system: Slightly reduced breath sounds in the bases with occasional basal crackles. No increased work of breathing. Cardiovascular system: S1 & S2 heard, irregularly irregular. No JVD, gallops, clicks. 1+ pitting bilateral leg edema extending to thighs. 3/6 systolic murmur at apex. Gastrointestinal  system: Abdomen is mildly distended, soft and nontender. Hepatomegaly appreciated-irregular, firm and nodular surface. Mild ascites. Normal bowel sounds heard. Mild patchy bruising mid right trunk area. Central nervous  system: Alert and oriented. No focal neurological deficits. Extremities: Symmetric 4 x 5 power upper extremity but reduced power in lower extremity.  Data Reviewed: Basic Metabolic Panel:  Recent Labs Lab 09/11/13 1940 09/12/13 0900 09/13/13 0515  NA 140 143 145  K 5.3* 4.7 4.9  CL 104 106 110  CO2 20 21 21   GLUCOSE 94 116* 93  BUN 113* 113* 112*  CREATININE 4.26* 4.08* 3.46*  CALCIUM 8.8 8.7 8.4   Liver Function Tests:  Recent Labs Lab 09/11/13 1940 09/12/13 0900  AST 20 15  ALT 19 17  ALKPHOS 298* 306*  BILITOT 0.7 0.8  PROT 6.9 6.6  ALBUMIN 3.4* 3.2*   CBC:  Recent Labs Lab 09/11/13 1940 09/12/13 0900 09/12/13 1858 09/13/13 0515  WBC 6.8 7.9 8.4 6.4  HGB 5.9* 7.7* 7.2* 7.0*  HCT 17.9* 22.5* 22.0* 21.1*  MCV 83.6 81.8 82.4 82.4  PLT 145* 131* 136* 138*   BNP (last 3 results)  Recent Labs  02/02/13 1655 09/11/13 1940 09/13/13 0515  PROBNP 2576.00* 3366.0* 2647.0*   Studies: Ct Abdomen Pelvis Wo Contrast  09/12/2013   CLINICAL DATA:  Decreased hemoglobin. Recent fall. Evaluate for retroperitoneal hemorrhage.  EXAM: CT ABDOMEN AND PELVIS WITHOUT CONTRAST  TECHNIQUE: Multidetector CT imaging of the abdomen and pelvis was performed following the standard protocol without intravenous contrast.  COMPARISON:  01/06/2013 and 04/01/12  FINDINGS: No evidence of retroperitoneal hemorrhage. Increased size of large right pleural effusion is seen. There is also increased mild low-attenuation ascites and diffuse body wall edema.  Low-attenuation lesion in the anterior left hepatic lobe measuring 4.3 cm remains stable, and is likely benign. No other liver lesions identified in this noncontrast study. Cholelithiasis again demonstrated. No evidence of acute cholecystitis. Bilateral renal cysts and tiny nonobstructive right renal calculi are stable. No evidence hydronephrosis. Adrenal glands, pancreas, and spleen have a normal appearance on this noncontrast study. No evidence  of abdominal or pelvic lymphadenopathy. No evidence of focal inflammatory process. Small bilateral inguinal hernias seen both containing only fat.  IMPRESSION: No evidence of retroperitoneal hemorrhage.  Increased size of large right pleural effusion, mild ascites, and diffuse body wall edema.  Stable left hepatic lobe lesion, likely benign. Stable cholelithiasis and nonobstructive right nephrolithiasis.   Electronically Signed   By: Earle Gell M.D.   On: 09/12/2013 10:19   Dg Chest 2 View  09/12/2013   CLINICAL DATA:  History of hypertension and. Nonsmoker. Acute right-sided congestive heart failure.  EXAM: CHEST  2 VIEW  COMPARISON:  03/03/2013  FINDINGS: Stable appearance of mediastinal postoperative changes. Shallow inspiration with elevation of the right hemidiaphragm. Mild cardiac enlargement. No pulmonary vascular congestion or edema. Small bilateral pleural effusions. Basilar atelectasis. No focal consolidation. No pneumothorax. Calcified and tortuous aorta.  IMPRESSION: Cardiac enlargement without significant vascular congestion or edema. Small bilateral pleural effusions and basilar atelectasis.   Electronically Signed   By: Lucienne Capers M.D.   On: 09/12/2013 06:23   Scheduled Meds: . atorvastatin  10 mg Oral q1800  . dalfampridine  10 mg Oral BID  . Dimethyl Fumarate  1 capsule Oral BID  . folic acid  1 mg Oral Daily  . warfarin  1 mg Oral ONCE-1800  . Warfarin - Pharmacist Dosing Inpatient   Does not apply q1800   Continuous Infusions: .  sodium chloride     Principal Problem:   Anasarca Active Problems:   Aortic dissection- chronic abd dissection    MS (multiple sclerosis)   Persistent atrial fibrillation   Severe tricuspid valve regurgitation   Dissection of aorta, thoracic- surgery Feb 2000   Long term (current) use of anticoagulants   Bilateral leg edema   Acute right-sided congestive heart failure   Unstable gait   Anemia due to blood loss, acute in setting of  supratheraputic INR 09/11/13   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   Acute renal insufficiency   Anemia  Time spent: 35 minutes.  Oswin Johal M. Cruzita Lederer, MD [facility] [phone]  If 7PM-7AM, please contact night-coverage [email or web address] Password [facility] 09/13/2013, 4:33 PM    LOS: 2 days

## 2013-09-13 NOTE — Clinical Social Work Psychosocial (Signed)
Clinical Social Work Department BRIEF PSYCHOSOCIAL ASSESSMENT 09/13/2013  Patient:  [name], [name]     Account Number:  [id number]     Admit date:  09/11/2013  Clinical Social Worker:  Jennette Dubin  Date/Time:  09/13/2013 01:05 PM  Referred by:  Physician  Date Referred:  09/13/2013 Referred for  Other - See comment   Other Referral:   ? Disposition   Interview type:  Family Other interview type:    PSYCHOSOCIAL DATA Living Status:  FAMILY Admitted from facility:   Level of care:   Primary support name:  [name] Primary support relationship to patient:  SPOUSE Degree of support available:   Good support    CURRENT CONCERNS Current Concerns  Other - See comment   Other Concerns:   Recents falls    SOCIAL WORK ASSESSMENT / PLAN CSW mer with pt. and his wife [name] to discuss dispostion. It was determined that pt. wants to return home. Pt.'s wife in agreement.CSW and CM, [name] spoke with the pt. and his wife about the number of falls that have occured. Pt.'s wife reported when pt. falls she is not able to pick him up from the floor. Pt.'s wife reports she would go to the neighbors and ask for assistance, this last time she call 911. Pt.'s wife states pt. has a number of bruises as a result. Pt. and his wife maintains pt. will return home. They asked for home care  and private sitter information. Resources given by CM.   Assessment/plan status:  Psychosocial Support/Ongoing Assessment of Needs Other assessment/ plan:   Information/referral to community resources:    PATIENT'S/FAMILY'S RESPONSE TO PLAN OF CARE: Pt. and pt.'s wife appreciates and understood the role of CSW. CSW will continue to support with resoucrces and information.   [address], [place]

## 2013-09-14 ENCOUNTER — Inpatient Hospital Stay (HOSPITAL_COMMUNITY): Payer: Medicare Other

## 2013-09-14 LAB — BASIC METABOLIC PANEL
BUN: 90 mg/dL — ABNORMAL HIGH (ref 6–23)
CO2: 20 mEq/L (ref 19–32)
Calcium: 8.4 mg/dL (ref 8.4–10.5)
Chloride: 115 mEq/L — ABNORMAL HIGH (ref 96–112)
Creatinine, Ser: 2.31 mg/dL — ABNORMAL HIGH (ref 0.50–1.35)
GFR calc Af Amer: 32 mL/min — ABNORMAL LOW (ref >90)
GFR calc non Af Amer: 28 mL/min — ABNORMAL LOW (ref >90)
Glucose, Bld: 96 mg/dL (ref 70–99)
Potassium: 4.6 mEq/L (ref 3.7–5.3)
Sodium: 148 mEq/L — ABNORMAL HIGH (ref 137–147)

## 2013-09-14 LAB — TYPE AND SCREEN
ABO/RH(D): O POS
Antibody Screen: NEGATIVE
Unit division: 0
Unit division: 0
Unit division: 0

## 2013-09-14 LAB — HAPTOGLOBIN: Haptoglobin: 66 mg/dL (ref 45–215)

## 2013-09-14 LAB — IRON AND TIBC
Iron: 34 ug/dL — ABNORMAL LOW (ref 42–135)
Saturation Ratios: 9 % — ABNORMAL LOW (ref 20–55)
TIBC: 368 ug/dL (ref 215–435)
UIBC: 334 ug/dL (ref 125–400)

## 2013-09-14 LAB — CBC
HCT: 24.1 % — ABNORMAL LOW (ref 39.0–52.0)
Hemoglobin: 7.9 g/dL — ABNORMAL LOW (ref 13.0–17.0)
MCH: 27.5 pg (ref 26.0–34.0)
MCHC: 32.8 g/dL (ref 30.0–36.0)
MCV: 84 fL (ref 78.0–100.0)
Platelets: 151 10*3/uL (ref 150–400)
RBC: 2.87 MIL/uL — ABNORMAL LOW (ref 4.22–5.81)
RDW: 16 % — ABNORMAL HIGH (ref 11.5–15.5)
WBC: 6.9 10*3/uL (ref 4.0–10.5)

## 2013-09-14 LAB — FERRITIN: Ferritin: 28 ng/mL (ref 22–322)

## 2013-09-14 LAB — PROTIME-INR
INR: 1.66 — ABNORMAL HIGH (ref 0.00–1.49)
Prothrombin Time: 19.1 seconds — ABNORMAL HIGH (ref 11.6–15.2)

## 2013-09-14 LAB — SODIUM, URINE, RANDOM: Sodium, Ur: 20 mEq/L

## 2013-09-14 LAB — LACTATE DEHYDROGENASE: LDH: 144 U/L (ref 94–250)

## 2013-09-14 LAB — CREATININE, URINE, RANDOM: Creatinine, Urine: 64.58 mg/dL

## 2013-09-14 MED ORDER — FUROSEMIDE 10 MG/ML IJ SOLN
4.0000 mg/h | INTRAVENOUS | Status: DC
  Administered 2013-09-14: 4 mg/h via INTRAVENOUS
  Filled 2013-09-14 (×3): qty 25

## 2013-09-14 MED ORDER — FERROUS SULFATE 325 (65 FE) MG PO TABS
325.0000 mg | ORAL_TABLET | Freq: Three times a day (TID) | ORAL | Status: DC
  Administered 2013-09-14 – 2013-09-18 (×12): 325 mg via ORAL
  Filled 2013-09-14 (×14): qty 1

## 2013-09-14 MED ORDER — FUROSEMIDE 10 MG/ML IJ SOLN
80.0000 mg | Freq: Once | INTRAMUSCULAR | Status: AC
  Administered 2013-09-14: 80 mg via INTRAVENOUS
  Filled 2013-09-14: qty 8

## 2013-09-14 MED ORDER — WARFARIN SODIUM 2 MG PO TABS
2.0000 mg | ORAL_TABLET | Freq: Once | ORAL | Status: AC
  Administered 2013-09-14: 2 mg via ORAL
  Filled 2013-09-14: qty 1

## 2013-09-14 NOTE — Progress Notes (Signed)
DAILY PROGRESS NOTE  Subjective:  Feeling better, however, still massively anasarcic. H/H improved, now after 3 units PRBC's. Blood pressure has rebounded off of medications currently. Creatinine is improving.  Objective:  Temp:  [97.4 F (36.3 C)-98.7 F (37.1 C)] 98 F (36.7 C) (01/01 0422) Pulse Rate:  [54-94] 84 (01/01 0422) Resp:  [20] 20 (01/01 0422) BP: (89-123)/(50-62) 123/56 mmHg (01/01 0422) SpO2:  [84 %-100 %] 99 % (01/01 0422) Weight:  [185 lb 6.5 oz (84.1 kg)] 185 lb 6.5 oz (84.1 kg) (01/01 0422) Weight change: -2 lb 13.9 oz (-1.3 kg)  Intake/Output from previous day: 12/31 0701 - 01/01 0700 In: 960 [P.O.:960] Out: 75 [Urine:75]  Intake/Output from this shift:    Medications: Current Facility-Administered Medications  Medication Dose Route Frequency Provider Last Rate Last Dose  . 0.9 %  sodium chloride infusion   Intravenous Continuous Oswald Hillock, MD      . acetaminophen (TYLENOL) tablet 650 mg  650 mg Oral Q6H PRN Oswald Hillock, MD       Or  . acetaminophen (TYLENOL) suppository 650 mg  650 mg Rectal Q6H PRN Oswald Hillock, MD      . atorvastatin (LIPITOR) tablet 10 mg  10 mg Oral q1800 Oswald Hillock, MD   10 mg at 09/13/13 1804  . dalfampridine TB12 10 mg  10 mg Oral BID Oswald Hillock, MD   10 mg at 09/13/13 2136  . Dimethyl Fumarate MISC 1 capsule  1 capsule Oral BID Oswald Hillock, MD   1 capsule at 09/13/13 2137  . docusate sodium (COLACE) capsule 100 mg  100 mg Oral Daily PRN Oswald Hillock, MD      . folic acid (FOLVITE) tablet 1 mg  1 mg Oral Daily Oswald Hillock, MD   1 mg at 09/13/13 1008  . QUEtiapine (SEROQUEL) tablet 25 mg  25 mg Oral QHS PRN Oswald Hillock, MD   25 mg at 09/12/13 2205  . Warfarin - Pharmacist Dosing Inpatient   Does not apply q1800 Lauren Bajbus, RPH        Physical Exam: General appearance: alert and skin color improved Neck: JVD - 10 cm above sternal notch and no carotid bruit Lungs: diminished breath sounds RLL and RML and  dullness to percussion RLL and RML Heart: irregularly irregular rhythm Abdomen: protuberant, +BS, hepatomegaly Extremities: edema anasarca Pulses: 2+ and symmetric Skin: less pale Neurologic: Mental status: Alert, oriented, thought content appropriate, more awake today Psych: Pleasant, less confused  Lab Results: Results for orders placed during the hospital encounter of 09/11/13 (from the past 48 hour(s))  CBC     Status: Abnormal   Collection Time    09/12/13  9:00 AM      Result Value Range   WBC 7.9  4.0 - 10.5 K/uL   RBC 2.75 (*) 4.22 - 5.81 MIL/uL   Hemoglobin 7.7 (*) 13.0 - 17.0 g/dL   Comment: REPEATED TO VERIFY     POST TRANSFUSION SPECIMEN   HCT 22.5 (*) 39.0 - 52.0 %   MCV 81.8  78.0 - 100.0 fL   MCH 28.0  26.0 - 34.0 pg   MCHC 34.2  30.0 - 36.0 g/dL   RDW 16.1 (*) 11.5 - 15.5 %   Platelets 131 (*) 150 - 400 K/uL  COMPREHENSIVE METABOLIC PANEL     Status: Abnormal   Collection Time    09/12/13  9:00 AM  Result Value Range   Sodium 143  137 - 147 mEq/L   Comment: Please note change in reference range.   Potassium 4.7  3.7 - 5.3 mEq/L   Comment: Please note change in reference range.   Chloride 106  96 - 112 mEq/L   CO2 21  19 - 32 mEq/L   Glucose, Bld 116 (*) 70 - 99 mg/dL   BUN 113 (*) 6 - 23 mg/dL   Creatinine, Ser 4.08 (*) 0.50 - 1.35 mg/dL   Calcium 8.7  8.4 - 10.5 mg/dL   Total Protein 6.6  6.0 - 8.3 g/dL   Albumin 3.2 (*) 3.5 - 5.2 g/dL   AST 15  0 - 37 U/L   ALT 17  0 - 53 U/L   Alkaline Phosphatase 306 (*) 39 - 117 U/L   Total Bilirubin 0.8  0.3 - 1.2 mg/dL   GFR calc non Af Amer 14 (*) >90 mL/min   GFR calc Af Amer 16 (*) >90 mL/min   Comment: (NOTE)     The eGFR has been calculated using the CKD EPI equation.     This calculation has not been validated in all clinical situations.     eGFR's persistently <90 mL/min signify possible Chronic Kidney     Disease.  PROTIME-INR     Status: Abnormal   Collection Time    09/12/13  9:00 AM       Result Value Range   Prothrombin Time 42.6 (*) 11.6 - 15.2 seconds   INR 4.73 (*) 0.00 - 1.49  CBC     Status: Abnormal   Collection Time    09/12/13  6:58 PM      Result Value Range   WBC 8.4  4.0 - 10.5 K/uL   RBC 2.67 (*) 4.22 - 5.81 MIL/uL   Hemoglobin 7.2 (*) 13.0 - 17.0 g/dL   HCT 22.0 (*) 39.0 - 52.0 %   MCV 82.4  78.0 - 100.0 fL   MCH 27.0  26.0 - 34.0 pg   MCHC 32.7  30.0 - 36.0 g/dL   RDW 16.1 (*) 11.5 - 15.5 %   Platelets 136 (*) 150 - 400 K/uL  PROTIME-INR     Status: Abnormal   Collection Time    09/13/13  5:15 AM      Result Value Range   Prothrombin Time 22.2 (*) 11.6 - 15.2 seconds   INR 2.02 (*) 0.00 - 7.65  BASIC METABOLIC PANEL     Status: Abnormal   Collection Time    09/13/13  5:15 AM      Result Value Range   Sodium 145  137 - 147 mEq/L   Comment: Please note change in reference range.   Potassium 4.9  3.7 - 5.3 mEq/L   Comment: Please note change in reference range.   Chloride 110  96 - 112 mEq/L   CO2 21  19 - 32 mEq/L   Glucose, Bld 93  70 - 99 mg/dL   BUN 112 (*) 6 - 23 mg/dL   Creatinine, Ser 3.46 (*) 0.50 - 1.35 mg/dL   Calcium 8.4  8.4 - 10.5 mg/dL   GFR calc non Af Amer 17 (*) >90 mL/min   GFR calc Af Amer 20 (*) >90 mL/min   Comment: (NOTE)     The eGFR has been calculated using the CKD EPI equation.     This calculation has not been validated in all clinical situations.     eGFR's  persistently <90 mL/min signify possible Chronic Kidney     Disease.  CBC     Status: Abnormal   Collection Time    09/13/13  5:15 AM      Result Value Range   WBC 6.4  4.0 - 10.5 K/uL   RBC 2.56 (*) 4.22 - 5.81 MIL/uL   Hemoglobin 7.0 (*) 13.0 - 17.0 g/dL   HCT 21.1 (*) 39.0 - 52.0 %   MCV 82.4  78.0 - 100.0 fL   MCH 27.3  26.0 - 34.0 pg   MCHC 33.2  30.0 - 36.0 g/dL   RDW 16.0 (*) 11.5 - 15.5 %   Platelets 138 (*) 150 - 400 K/uL  PRO B NATRIURETIC PEPTIDE     Status: Abnormal   Collection Time    09/13/13  5:15 AM      Result Value Range   Pro B  Natriuretic peptide (BNP) 2647.0 (*) 0 - 125 pg/mL  PREPARE RBC (CROSSMATCH)     Status: None   Collection Time    09/13/13 10:30 AM      Result Value Range   Order Confirmation ORDER PROCESSED BY BLOOD BANK    CBC     Status: Abnormal   Collection Time    09/13/13  7:23 PM      Result Value Range   WBC 6.8  4.0 - 10.5 K/uL   RBC 2.96 (*) 4.22 - 5.81 MIL/uL   Hemoglobin 8.1 (*) 13.0 - 17.0 g/dL   HCT 24.9 (*) 39.0 - 52.0 %   MCV 84.1  78.0 - 100.0 fL   MCH 27.4  26.0 - 34.0 pg   MCHC 32.5  30.0 - 36.0 g/dL   RDW 15.7 (*) 11.5 - 15.5 %   Platelets 146 (*) 150 - 400 K/uL  PROTIME-INR     Status: Abnormal   Collection Time    09/14/13  6:05 AM      Result Value Range   Prothrombin Time 19.1 (*) 11.6 - 15.2 seconds   INR 1.66 (*) 0.00 - 1.49  CBC     Status: Abnormal   Collection Time    09/14/13  6:05 AM      Result Value Range   WBC 6.9  4.0 - 10.5 K/uL   RBC 2.87 (*) 4.22 - 5.81 MIL/uL   Hemoglobin 7.9 (*) 13.0 - 17.0 g/dL   HCT 24.1 (*) 39.0 - 52.0 %   MCV 84.0  78.0 - 100.0 fL   MCH 27.5  26.0 - 34.0 pg   MCHC 32.8  30.0 - 36.0 g/dL   RDW 16.0 (*) 11.5 - 15.5 %   Platelets 151  150 - 400 K/uL    Imaging: Ct Abdomen Pelvis Wo Contrast  09/12/2013   CLINICAL DATA:  Decreased hemoglobin. Recent fall. Evaluate for retroperitoneal hemorrhage.  EXAM: CT ABDOMEN AND PELVIS WITHOUT CONTRAST  TECHNIQUE: Multidetector CT imaging of the abdomen and pelvis was performed following the standard protocol without intravenous contrast.  COMPARISON:  01/06/2013 and 04/01/12  FINDINGS: No evidence of retroperitoneal hemorrhage. Increased size of large right pleural effusion is seen. There is also increased mild low-attenuation ascites and diffuse body wall edema.  Low-attenuation lesion in the anterior left hepatic lobe measuring 4.3 cm remains stable, and is likely benign. No other liver lesions identified in this noncontrast study. Cholelithiasis again demonstrated. No evidence of acute  cholecystitis. Bilateral renal cysts and tiny nonobstructive right renal calculi are stable. No evidence hydronephrosis. Adrenal  glands, pancreas, and spleen have a normal appearance on this noncontrast study. No evidence of abdominal or pelvic lymphadenopathy. No evidence of focal inflammatory process. Small bilateral inguinal hernias seen both containing only fat.  IMPRESSION: No evidence of retroperitoneal hemorrhage.  Increased size of large right pleural effusion, mild ascites, and diffuse body wall edema.  Stable left hepatic lobe lesion, likely benign. Stable cholelithiasis and nonobstructive right nephrolithiasis.   Electronically Signed   By: Earle Gell M.D.   On: 09/12/2013 10:19    Assessment:  1. Principal Problem: 2.   Anasarca 3. Active Problems: 4.   Aortic dissection- chronic abd dissection  5.   MS (multiple sclerosis) 6.   Persistent atrial fibrillation 7.   Severe tricuspid valve regurgitation 8.   Dissection of aorta, thoracic- surgery Feb 2000 9.   Long term (current) use of anticoagulants 10.   Bilateral leg edema 11.   Acute right-sided congestive heart failure 12.   Unstable gait 13.   Anemia due to blood loss, acute in setting of supratheraputic INR 09/11/13 14.   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 15.   Acute renal insufficiency 16.   Anemia 17.   Plan:  1. I would recommend that we start diuresis today - I believe he is no longer pre-renal and has adequate colloid to maintain intravascular oncotic pressure. Bolus lasix 80 mg IV x 1, then start gtts at 4 mg/hr. Will also need to address large right pleural effusion - plan repeat CXR in 1-2 days and if not improved with diuresis, consider IR thoracentesis (now that INR is reversed).  Would continue to hold BP medications.   Time Spent Directly with Patient:  15 minutes  Length of Stay:  LOS: 3 days   Pixie Casino, MD, [facility] Attending Cardiologist [facility]  HILTY,Kenneth  C 09/14/2013, 7:30 AM

## 2013-09-14 NOTE — Progress Notes (Signed)
ANTICOAGULATION CONSULT NOTE - Follow Up Consult  Pharmacy Consult for warfarin Indication: atrial fibrillation  No Known Allergies  Patient Measurements: Height: 5\' 8"  (172.7 cm) Weight: 185 lb 6.5 oz (84.1 kg) IBW/kg (Calculated) : 68.4   Vital Signs: Temp: 98 F (36.7 C) (01/01 0422) Temp src: Oral (01/01 0422) BP: 123/56 mmHg (01/01 0422) Pulse Rate: 84 (01/01 0422)  Labs:  Recent Labs  09/11/13 1940  09/12/13 0900  09/13/13 0515 09/13/13 1923 09/14/13 0605  HGB 5.9*  --  7.7*  < > 7.0* 8.1* 7.9*  HCT 17.9*  --  22.5*  < > 21.1* 24.9* 24.1*  PLT 145*  --  131*  < > 138* 146* 151  LABPROT  --   < > 42.6*  --  22.2*  --  19.1*  INR  --   < > 4.73*  --  2.02*  --  1.66*  CREATININE 4.26*  --  4.08*  --  3.46*  --   --   < > = values in this interval not displayed.  Estimated Creatinine Clearance: 22.2 ml/min (by C-G formula based on Cr of 3.46).   Medications:  Scheduled:  . atorvastatin  10 mg Oral q1800  . dalfampridine  10 mg Oral BID  . Dimethyl Fumarate  1 capsule Oral BID  . folic acid  1 mg Oral Daily  . furosemide  80 mg Intravenous Once  . warfarin  2 mg Oral ONCE-1800  . Warfarin - Pharmacist Dosing Inpatient   Does not apply q1800    Assessment: 85 YOM with MS who was admitted with a SUPRAtherapeutic INR of 5.19. He received vitamin K 2.5mg  IV x1 12/29.  INR down to 2.02 on 12/31 after 2 doses of Vitamin K. Coumadin restarted last night with 1mg  x 1. Hgb 7.9, Plts 151 with extensive ecchymosis. No overt bleeding noted.   Goal of Therapy:  INR 2-3 Monitor platelets by anticoagulation protocol: Yes   Plan:  - Warfarin 2mg  x 1 tonight to reach therapeutic quicker. If a good bump is seen tomorrow, may be okay to dose 1mg  given supratherapeutic on admit - Daily PT/INR - Follow closely for s/s of bleeding, CBC, and acute changes in INR given supratherapeutic admit   Trayson Stitely B. Leitha Schuller, PharmD Clinical Pharmacist - Resident Pager: [phone] Phone:  [phone] 09/14/2013 8:38 AM

## 2013-09-14 NOTE — Progress Notes (Signed)
Started on Lasix gtt @ 4cc/hr today, after 80mg  IV x 1 dose.  Condom catheter intact and strict I&O counted for most of shift.

## 2013-09-14 NOTE — Plan of Care (Signed)
Problem: Phase I Progression Outcomes Goal: EF % per last Echo/documented,Core Reminder form on chart Outcome: Completed/Met Date Met:  09/14/13 EF = 55-60 % 02/17/13 Goal: Initial discharge plan identified Outcome: Completed/Met Date Met:  09/14/13 From home with wife

## 2013-09-14 NOTE — Progress Notes (Signed)
PROGRESS NOTE  [name] [id number] DOB: 08/19/1946 DOA: 09/11/2013 PCP: Criselda Peaches, MD  Assessment/Plan: Acute on chronic anemia - Unclear etiology. No overt blood loss. No reported melena or vomiting. - CT abdomen without contrast does not show retroperitoneal hematoma. - Status post 3 units total PRBCs.  - iron studies borderline low, will start iron supplements.  Anasarca/acute right-sided CHF/severe TR/moderate MR - Secondary to decompensated right-sided heart failure from severe tricuspid regurgitation. Diuretics have not been started initially due to relative hypotension and soft blood pressures this morning in the context of acute renal failure. His hemodynamics seems to improve slightly on 09/14/13 and he was started on a Lasix gtt.  - Not felt to be a surgical candidate. Cardiology is reviewing options for second opinion regarding valve surgery. - R sided pleural effusion, repeat CXR 1/3. Persistent A. Fib - Controlled ventricular rate.  - Currently on no rate control medications. Consider resuming metoprolol as blood pressure tolerates. Acute renal failure- Likely multifactorial from hypotension, diuresis and ARB. These medications are on hold. - Improving. No hydronephrosis seen on CT abdomen. Hypotension - Management as above. Improving. Multiple sclerosis - Neurology consultation appreciated. Signed off 12/30.  Code Status: Full Family Communication: Discussed with spouse at bedside. Disposition Plan: To be determined.  Consultants:  Neurology  Cardiology  Procedures:  None  Antibiotics:  None   Subjective: - no complaints, denies chest pain.    Objective: Filed Vitals:   09/14/13 0422 09/14/13 0902 09/14/13 0959 09/14/13 1300  BP: 123/56 112/47  99/53  Pulse: 84 92  90  Temp: 98 F (36.7 C)  98.2 F (36.8 C) 98 F (36.7 C)  TempSrc: Oral  Oral Oral  Resp: 20 20  20   Height:      Weight: 84.1 kg (185 lb 6.5 oz)     SpO2: 99% 99%   100%    Intake/Output Summary (Last 24 hours) at 09/14/13 1534 Last data filed at 09/14/13 1300  Gross per 24 hour  Intake   1064 ml  Output    675 ml  Net    389 ml   Filed Weights   09/11/13 1700 09/13/13 0634 09/14/13 0422  Weight: 86.5 kg (190 lb 11.2 oz) 85.4 kg (188 lb 4.4 oz) 84.1 kg (185 lb 6.5 oz)   Exam: General exam: Moderately built and nourished male. Respiratory system: Slightly reduced breath sounds in the bases with occasional basal crackles. No increased work of breathing. Cardiovascular system: S1 & S2 heard, irregularly irregular. No JVD, gallops, clicks. 1+ pitting bilateral leg edema extending to thighs. 3/6 systolic murmur at apex. Gastrointestinal system: Abdomen is mildly distended, soft and nontender. Hepatomegaly appreciated-irregular, firm and nodular surface. Central nervous system: Alert and oriented. No focal neurological deficits. Extremities: Symmetric 4 x 5 power upper extremity but reduced power in lower extremity.  Data Reviewed: Basic Metabolic Panel:  Recent Labs Lab 09/11/13 1940 09/12/13 0900 09/13/13 0515 09/14/13 0605  NA 140 143 145 148*  K 5.3* 4.7 4.9 4.6  CL 104 106 110 115*  CO2 20 21 21 20   GLUCOSE 94 116* 93 96  BUN 113* 113* 112* 90*  CREATININE 4.26* 4.08* 3.46* 2.31*  CALCIUM 8.8 8.7 8.4 8.4   Liver Function Tests:  Recent Labs Lab 09/11/13 1940 09/12/13 0900  AST 20 15  ALT 19 17  ALKPHOS 298* 306*  BILITOT 0.7 0.8  PROT 6.9 6.6  ALBUMIN 3.4* 3.2*   CBC:  Recent Labs Lab 09/12/13  0900 09/12/13 1858 09/13/13 0515 09/13/13 1923 09/14/13 0605  WBC 7.9 8.4 6.4 6.8 6.9  HGB 7.7* 7.2* 7.0* 8.1* 7.9*  HCT 22.5* 22.0* 21.1* 24.9* 24.1*  MCV 81.8 82.4 82.4 84.1 84.0  PLT 131* 136* 138* 146* 151   BNP (last 3 results)  Recent Labs  02/02/13 1655 09/11/13 1940 09/13/13 0515  PROBNP 2576.00* 3366.0* 2647.0*   Studies: No results found. Scheduled Meds: . atorvastatin  10 mg Oral q1800  .  dalfampridine  10 mg Oral BID  . Dimethyl Fumarate  1 capsule Oral BID  . folic acid  1 mg Oral Daily  . warfarin  2 mg Oral ONCE-1800  . Warfarin - Pharmacist Dosing Inpatient   Does not apply q1800   Continuous Infusions: . sodium chloride    . furosemide (LASIX) infusion 4 mg/hr (09/14/13 1002)   Principal Problem:   Anasarca Active Problems:   Aortic dissection- chronic abd dissection    MS (multiple sclerosis)   Persistent atrial fibrillation   Severe tricuspid valve regurgitation   Dissection of aorta, thoracic- surgery Feb 2000   Long term (current) use of anticoagulants   Bilateral leg edema   Acute right-sided congestive heart failure   Unstable gait   Anemia due to blood loss, acute in setting of supratheraputic INR 09/11/13   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   Acute renal insufficiency   Anemia  Time spent: 35 minutes.  Kayshaun Polanco M. Cruzita Lederer, MD [facility] [phone]  If 7PM-7AM, please contact night-coverage [email or web address] Password [email or web address] 09/14/2013, 3:34 PM    LOS: 3 days

## 2013-09-15 ENCOUNTER — Other Ambulatory Visit: Payer: Self-pay | Admitting: *Deleted

## 2013-09-15 DIAGNOSIS — G35 Multiple sclerosis: Secondary | ICD-10-CM

## 2013-09-15 DIAGNOSIS — J90 Pleural effusion, not elsewhere classified: Secondary | ICD-10-CM

## 2013-09-15 LAB — CBC
HCT: 25.5 % — ABNORMAL LOW (ref 39.0–52.0)
Hemoglobin: 8.4 g/dL — ABNORMAL LOW (ref 13.0–17.0)
MCH: 27.7 pg (ref 26.0–34.0)
MCHC: 32.9 g/dL (ref 30.0–36.0)
MCV: 84.2 fL (ref 78.0–100.0)
Platelets: 155 10*3/uL (ref 150–400)
RBC: 3.03 MIL/uL — ABNORMAL LOW (ref 4.22–5.81)
RDW: 16.3 % — ABNORMAL HIGH (ref 11.5–15.5)
WBC: 5.5 10*3/uL (ref 4.0–10.5)

## 2013-09-15 LAB — BASIC METABOLIC PANEL
BUN: 66 mg/dL — ABNORMAL HIGH (ref 6–23)
CO2: 22 mEq/L (ref 19–32)
Calcium: 8.8 mg/dL (ref 8.4–10.5)
Chloride: 112 mEq/L (ref 96–112)
Creatinine, Ser: 1.78 mg/dL — ABNORMAL HIGH (ref 0.50–1.35)
GFR calc Af Amer: 44 mL/min — ABNORMAL LOW (ref >90)
GFR calc non Af Amer: 38 mL/min — ABNORMAL LOW (ref >90)
Glucose, Bld: 104 mg/dL — ABNORMAL HIGH (ref 70–99)
Potassium: 3.9 mEq/L (ref 3.7–5.3)
Sodium: 150 mEq/L — ABNORMAL HIGH (ref 137–147)

## 2013-09-15 LAB — PROTIME-INR
INR: 1.71 — ABNORMAL HIGH (ref 0.00–1.49)
Prothrombin Time: 19.6 seconds — ABNORMAL HIGH (ref 11.6–15.2)

## 2013-09-15 LAB — GLUCOSE, CAPILLARY: Glucose-Capillary: 99 mg/dL (ref 70–99)

## 2013-09-15 MED ORDER — WARFARIN SODIUM 2 MG PO TABS
2.0000 mg | ORAL_TABLET | Freq: Once | ORAL | Status: DC
  Filled 2013-09-15: qty 1

## 2013-09-15 NOTE — Progress Notes (Signed)
PROGRESS NOTE  OVEL GUBA [id number] DOB: Sep 15, 1945 DOA: 09/11/2013 PCP: Criselda Peaches, MD  Assessment/Plan: Acute on chronic anemia - Unclear etiology. No overt blood loss. No reported melena or vomiting. He reports a history of hemorrhoidal bleeding in the past, massive but none recently. - CT abdomen without contrast does not show retroperitoneal hematoma. - Status post 3 units total PRBCs.  - iron studies borderline low, will start iron supplements.  Anasarca/acute right-sided CHF/severe TR/moderate MR - Secondary to decompensated right-sided heart failure from severe tricuspid regurgitation. Diuretics have not been started initially due to relative hypotension and soft blood pressures this morning in the context of acute renal failure. His hemodynamics seems to improve slightly on 09/14/13 and he was started on a Lasix gtt.  - Not felt to be a surgical candidate. Cardiology is reviewing options for second opinion regarding valve surgery.  - R sided pleural effusion, repeat CXR 1/3.  Persistent A. Fib  - Controlled ventricular rate.  - Currently on no rate control medications. Consider resuming metoprolol as blood pressure tolerates. - hold Warfarin until evaluating CXR tomorrow whether he needs thoracentesis.  Acute renal failure- Likely multifactorial from hypotension, diuresis and ARB. These medications are on hold. - Improving. No hydronephrosis seen on CT abdomen. Hypotension - Management as above. Improving. Multiple sclerosis - Neurology consultation appreciated. Signed off 12/30.  Code Status: Full Family Communication: none Disposition Plan: To be determined.  Consultants:  Neurology  Cardiology  Procedures:  None  Antibiotics:  None   Subjective: - no complaints, denies chest pain.    Objective: Filed Vitals:   09/14/13 1300 09/14/13 2043 09/15/13 0135 09/15/13 0446  BP: 99/53 97/59 107/52 102/54  Pulse: 90 76 79 86  Temp: 98 F (36.7 C)  98.5 F (36.9 C) 98.5 F (36.9 C) 98.4 F (36.9 C)  TempSrc: Oral Oral Oral Oral  Resp: 20 20 18 18   Height:      Weight:    80.105 kg (176 lb 9.6 oz)  SpO2: 100% 96% 100% 97%    Intake/Output Summary (Last 24 hours) at 09/15/13 1407 Last data filed at 09/15/13 1100  Gross per 24 hour  Intake 1095.6 ml  Output   3350 ml  Net -2254.4 ml   Filed Weights   09/13/13 0634 09/14/13 0422 09/15/13 0446  Weight: 85.4 kg (188 lb 4.4 oz) 84.1 kg (185 lb 6.5 oz) 80.105 kg (176 lb 9.6 oz)   Exam: General exam: Moderately built and nourished male. Respiratory system: Slightly reduced breath sounds in the bases with occasional basal crackles. No increased work of breathing. Cardiovascular system: S1 & S2 heard, irregularly irregular. No JVD, gallops, clicks. 1+ pitting bilateral leg edema extending to thighs. 3/6 systolic murmur at apex. Gastrointestinal system: Abdomen is mildly distended, soft and nontender. Hepatomegaly appreciated-irregular, firm and nodular surface. Central nervous system: Alert and oriented. No focal neurological deficits. Extremities: Symmetric 4 x 5 power upper extremity but reduced power in lower extremity.  Data Reviewed: Basic Metabolic Panel:  Recent Labs Lab 09/11/13 1940 09/12/13 0900 09/13/13 0515 09/14/13 0605 09/15/13 0745  NA 140 143 145 148* 150*  K 5.3* 4.7 4.9 4.6 3.9  CL 104 106 110 115* 112  CO2 20 21 21 20 22   GLUCOSE 94 116* 93 96 104*  BUN 113* 113* 112* 90* 66*  CREATININE 4.26* 4.08* 3.46* 2.31* 1.78*  CALCIUM 8.8 8.7 8.4 8.4 8.8   Liver Function Tests:  Recent Labs Lab 09/11/13 1940 09/12/13 0900  AST 20 15  ALT 19 17  ALKPHOS 298* 306*  BILITOT 0.7 0.8  PROT 6.9 6.6  ALBUMIN 3.4* 3.2*   CBC:  Recent Labs Lab 09/12/13 1858 09/13/13 0515 09/13/13 1923 09/14/13 0605 09/15/13 0745  WBC 8.4 6.4 6.8 6.9 5.5  HGB 7.2* 7.0* 8.1* 7.9* 8.4*  HCT 22.0* 21.1* 24.9* 24.1* 25.5*  MCV 82.4 82.4 84.1 84.0 84.2  PLT 136* 138*  146* 151 155   BNP (last 3 results)  Recent Labs  02/02/13 1655 09/11/13 1940 09/13/13 0515  PROBNP 2576.00* 3366.0* 2647.0*   Studies: Dg Knee Right Port  09/14/2013   CLINICAL DATA:  Knee pain after fall.  EXAM: PORTABLE RIGHT KNEE - 1-2 VIEW  COMPARISON:  None.  FINDINGS: Two views of the right knee were obtained. The lateral view is obliqued and limited. However, there is no evidence for fracture or dislocation. No evidence for a large joint effusion.  IMPRESSION: Limited study without gross abnormality.   Electronically Signed   By: Markus Daft M.D.   On: 09/14/2013 16:42   Scheduled Meds: . atorvastatin  10 mg Oral q1800  . dalfampridine  10 mg Oral BID  . Dimethyl Fumarate  1 capsule Oral BID  . ferrous sulfate  325 mg Oral TID WC  . folic acid  1 mg Oral Daily  . Warfarin - Pharmacist Dosing Inpatient   Does not apply q1800   Continuous Infusions: . sodium chloride    . furosemide (LASIX) infusion 4 mg/hr (09/14/13 1002)   Principal Problem:   Anasarca Active Problems:   Aortic dissection- chronic abd dissection    MS (multiple sclerosis)   Persistent atrial fibrillation   Severe tricuspid valve regurgitation   Dissection of aorta, thoracic- surgery Feb 2000   Long term (current) use of anticoagulants   Bilateral leg edema   Acute right-sided congestive heart failure   Unstable gait   Anemia due to blood loss, acute in setting of supratheraputic INR 09/11/13   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   Acute renal insufficiency   Anemia  Time spent: 35 minutes.  Evianna Chandran M. Cruzita Lederer, MD [facility] [phone]  If 7PM-7AM, please contact night-coverage [email or web address] Password [email or web address] 09/15/2013, 2:07 PM    LOS: 4 days

## 2013-09-15 NOTE — Discharge Instructions (Addendum)
Information on my medicine - Coumadin   (Warfarin)  This medication education was reviewed with me or my healthcare representative as part of my discharge preparation.  The pharmacist that spoke with me during my hospital stay was:  Von Nils Flack, RPH  Why was Coumadin prescribed for you? Coumadin was prescribed for you because you have a blood clot or a medical condition that can cause an increased risk of forming blood clots. Blood clots can cause serious health problems by blocking the flow of blood to the heart, lung, or brain. Coumadin can prevent harmful blood clots from forming. As a reminder your indication for Coumadin is:   Stroke Prevention Because Of Atrial Fibrillation  What test will check on my response to Coumadin? While on Coumadin (warfarin) you will need to have an INR test regularly to ensure that your dose is keeping you in the desired range. The INR (international normalized ratio) number is calculated from the result of the laboratory test called prothrombin time (PT).  If an INR APPOINTMENT HAS NOT ALREADY BEEN MADE FOR YOU please schedule an appointment to have this lab work done by your health care provider within 7 days. Your INR goal is usually a number between:  2 to 3 or your provider may give you a more narrow range like 2-2.5.  Ask your health care provider during an office visit what your goal INR is.  What  do you need to  know  About  COUMADIN? Take Coumadin (warfarin) exactly as prescribed by your healthcare provider about the same time each day.  DO NOT stop taking without talking to the doctor who prescribed the medication.  Stopping without other blood clot prevention medication to take the place of Coumadin may increase your risk of developing a new clot or stroke.  Get refills before you run out.  What do you do if you miss a dose? If you miss a dose, take it as soon as you remember on the same day then continue your regularly scheduled regimen the  next day.  Do not take two doses of Coumadin at the same time.  Important Safety Information A possible side effect of Coumadin (Warfarin) is an increased risk of bleeding. You should call your healthcare provider right away if you experience any of the following:   Bleeding from an injury or your nose that does not stop.   Unusual colored urine (red or dark brown) or unusual colored stools (red or black).   Unusual bruising for unknown reasons.   A serious fall or if you hit your head (even if there is no bleeding).  Some foods or medicines interact with Coumadin (warfarin) and might alter your response to warfarin. To help avoid this:   Eat a balanced diet, maintaining a consistent amount of Vitamin K.   Notify your provider about major diet changes you plan to make.   Avoid alcohol or limit your intake to 1 drink for women and 2 drinks for men per day. (1 drink is 5 oz. wine, 12 oz. beer, or 1.5 oz. liquor.)  Make sure that ANY health care provider who prescribes medication for you knows that you are taking Coumadin (warfarin).  Also make sure the healthcare provider who is monitoring your Coumadin knows when you have started a new medication including herbals and non-prescription products.  Coumadin (Warfarin)  Major Drug Interactions  Increased Warfarin Effect Decreased Warfarin Effect  Alcohol (large quantities) Antibiotics (esp. Septra/Bactrim, Flagyl, Cipro) Amiodarone (Cordarone)  Aspirin (ASA) Cimetidine (Tagamet) Megestrol (Megace) NSAIDs (ibuprofen, naproxen, etc.) Piroxicam (Feldene) Propafenone (Rythmol SR) Propranolol (Inderal) Isoniazid (INH) Posaconazole (Noxafil) Barbiturates (Phenobarbital) Carbamazepine (Tegretol) Chlordiazepoxide (Librium) Cholestyramine (Questran) Griseofulvin Oral Contraceptives Rifampin Sucralfate (Carafate) Vitamin K   Coumadin (Warfarin) Major Herbal Interactions  Increased Warfarin Effect Decreased Warfarin Effect   Garlic Ginseng Ginkgo biloba Coenzyme Q10 Green tea St. Johns wort    Coumadin (Warfarin) FOOD Interactions  Eat a consistent number of servings per week of foods HIGH in Vitamin K (1 serving =  cup)  Collards (cooked, or boiled & drained) Kale (cooked, or boiled & drained) Mustard greens (cooked, or boiled & drained) Parsley *serving size only =  cup Spinach (cooked, or boiled & drained) Swiss chard (cooked, or boiled & drained) Turnip greens (cooked, or boiled & drained)  Eat a consistent number of servings per week of foods MEDIUM-HIGH in Vitamin K (1 serving = 1 cup)  Asparagus (cooked, or boiled & drained) Broccoli (cooked, boiled & drained, or raw & chopped) Brussel sprouts (cooked, or boiled & drained) *serving size only =  cup Lettuce, raw (green leaf, endive, romaine) Spinach, raw Turnip greens, raw & chopped   These websites have more information on Coumadin (warfarin):  FailFactory.se; VeganReport.com.au;   You were cared for by a hospitalist during your hospital stay. If you have any questions about your discharge medications or the care you received while you were in the hospital after you are discharged, you can call the unit and asked to speak with the hospitalist on call if the hospitalist that took care of you is not available. Once you are discharged, your primary care physician will handle any further medical issues. Please note that NO REFILLS for any discharge medications will be authorized once you are discharged, as it is imperative that you return to your primary care physician (or establish a relationship with a primary care physician if you do not have one) for your aftercare needs so that they can reassess your need for medications and monitor your lab values.     If you do not have a primary care physician, you can call [phone] for a physician referral.  Follow with Primary MD GREEN, [name], MD in 5-7 days   Get CBC, CMP  checked by your doctor and again as further instructed.  Get a 2 view Chest X ray done next visit if you had Pneumonia of Lung problems at the [facility] reviewed and adjusted.  Please request your Prim.MD to go over all Hospital Tests and Procedure/Radiological results at the follow up, please get all Hospital records sent to your Prim MD by signing hospital release before you go home.  Activity: As tolerated with Full fall precautions use walker/cane & assistance as needed  Diet: heart healthy, low salt  For Heart failure patients - Check your Weight same time everyday, if you gain over 2 pounds, or you develop in leg swelling, experience more shortness of breath or chest pain, call your Primary MD immediately. Follow Cardiac Low Salt Diet and 1.8 lit/day fluid restriction.  Disposition Home  If you experience worsening of your admission symptoms, develop shortness of breath, life threatening emergency, suicidal or homicidal thoughts you must seek medical attention immediately by calling 911 or calling your MD immediately  if symptoms less severe.  You Must read complete instructions/literature along with all the possible adverse reactions/side effects for all the Medicines you take and that have been prescribed to you. Take any new  Medicines after you have completely understood and accpet all the possible adverse reactions/side effects.   Do not drive and provide baby sitting services if your were admitted for syncope or siezures until you have seen by Primary MD or a Neurologist and advised to do so again.  Do not drive when taking Pain medications.   Do not take more than prescribed Pain, Sleep and Anxiety Medications  Special Instructions: If you have smoked or chewed Tobacco  in the last 2 yrs please stop smoking, stop any regular Alcohol  and or any Recreational drug use.  Wear Seat belts while driving.

## 2013-09-15 NOTE — Progress Notes (Signed)
Subjective: No complaints except with condom cath.  He is not always   Objective: Vital signs in last 24 hours: Temp:  [98 F (36.7 C)-98.5 F (36.9 C)] 98.4 F (36.9 C) (01/02 0446) Pulse Rate:  [76-92] 86 (01/02 0446) Resp:  [18-20] 18 (01/02 0446) BP: (97-112)/(47-59) 102/54 mmHg (01/02 0446) SpO2:  [96 %-100 %] 97 % (01/02 0446) Weight:  [176 lb 9.6 oz (80.105 kg)] 176 lb 9.6 oz (80.105 kg) (01/02 0446) Weight change: -8 lb 12.9 oz (-3.995 kg) Last BM Date: 09/13/13 Intake/Output from previous day: -2030 (-118.3 since admit) wt 176.9down fro 190 on admit  01/01 0701 - 01/02 0700 In: 1219.6 [P.O.:1060; I.V.:159.6] Out: 3250 [Urine:3250] Intake/Output this shift:    PE: General:Pleasant affect, NAD Skin:Warm and dry, brisk capillary refill HEENT:normocephalic, sclera clear, mucus membranes moist Heart:irreg irreg with murmur, no gallup, rub or click Lungs:decreased breath sounds without overt rales, rhonchi, or wheezes [id number], non tender, + BS, do not palpate liver spleen or masses Ext:no lower ext edema- legs wrapped, much improved,  Neuro:alert and oriented, MAE, follows commands, + facial symmetry   Lab Results:  Recent Labs  09/14/13 0605 09/15/13 0745  WBC 6.9 5.5  HGB 7.9* 8.4*  HCT 24.1* 25.5*  PLT 151 155   BMET  Recent Labs  09/13/13 0515 09/14/13 0605  NA 145 148*  K 4.9 4.6  CL 110 115*  CO2 21 20  GLUCOSE 93 96  BUN 112* 90*  CREATININE 3.46* 2.31*  CALCIUM 8.4 8.4     Hepatic Function Panel  Recent Labs  09/12/13 0900  PROT 6.6  ALBUMIN 3.2*  AST 15  ALT 17  ALKPHOS 306*  BILITOT 0.8     Studies/Results: Dg Knee Right Port  09/14/2013   CLINICAL DATA:  Knee pain after fall.  EXAM: PORTABLE RIGHT KNEE - 1-2 VIEW  COMPARISON:  None.  FINDINGS: Two views of the right knee were obtained. The lateral view is obliqued and limited. However, there is no evidence for fracture or dislocation. No evidence for a large  joint effusion.  IMPRESSION: Limited study without gross abnormality.   Electronically Signed   By: Markus Daft M.D.   On: 09/14/2013 16:42    Medications: I have reviewed the patient's current medications. Scheduled Meds: . atorvastatin  10 mg Oral q1800  . dalfampridine  10 mg Oral BID  . Dimethyl Fumarate  1 capsule Oral BID  . ferrous sulfate  325 mg Oral TID WC  . folic acid  1 mg Oral Daily  . Warfarin - Pharmacist Dosing Inpatient   Does not apply q1800   Continuous Infusions: . sodium chloride    . furosemide (LASIX) infusion 4 mg/hr (09/14/13 1002)   PRN Meds:.acetaminophen, acetaminophen, docusate sodium, QUEtiapine  Assessment/Plan: Principal Problem:   Anasarca Active Problems:   Aortic dissection- chronic abd dissection    MS (multiple sclerosis)   Persistent atrial fibrillation   Severe tricuspid valve regurgitation   Dissection of aorta, thoracic- surgery Feb 2000   Long term (current) use of anticoagulants   Bilateral leg edema   Acute right-sided congestive heart failure   Unstable gait   Anemia due to blood loss, acute in setting of supratheraputic INR 09/11/13   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   Acute renal insufficiency   Anemia  PLAN: now negative I&O  Labs pending INR at 1.66  H/H stable Continue Lasix drip for another day then  change to po depending on BMP as well. CXR in am.    LOS: 4 days   Time spent with pt. :15 minutes. [facility] R  Nurse Practitioner Certified Pager [phone] or after 5pm and on weekends call [phone] 09/15/2013, 8:25 AM

## 2013-09-15 NOTE — Progress Notes (Addendum)
ANTICOAGULATION CONSULT NOTE - Follow Up Consult  Pharmacy Consult for warfarin Indication: atrial fibrillation  No Known Allergies  Patient Measurements: Height: 5\' 8"  (172.7 cm) Weight: 176 lb 9.6 oz (80.105 kg) (BEDSCALE) IBW/kg (Calculated) : 68.4   Vital Signs: Temp: 98.4 F (36.9 C) (01/02 0446) Temp src: Oral (01/02 0446) BP: 102/54 mmHg (01/02 0446) Pulse Rate: 86 (01/02 0446)  Labs:  Recent Labs  09/13/13 0515 09/13/13 1923 09/14/13 0605 09/15/13 0745  HGB 7.0* 8.1* 7.9* 8.4*  HCT 21.1* 24.9* 24.1* 25.5*  PLT 138* 146* 151 155  LABPROT 22.2*  --  19.1* 19.6*  INR 2.02*  --  1.66* 1.71*  CREATININE 3.46*  --  2.31* 1.78*    Estimated Creatinine Clearance: 39.5 ml/min (by C-G formula based on Cr of 1.78).   Medications:  Scheduled:  . atorvastatin  10 mg Oral q1800  . dalfampridine  10 mg Oral BID  . Dimethyl Fumarate  1 capsule Oral BID  . ferrous sulfate  325 mg Oral TID WC  . folic acid  1 mg Oral Daily  . warfarin  2 mg Oral ONCE-1800  . Warfarin - Pharmacist Dosing Inpatient   Does not apply q1800    Assessment: 44 YOM with MS who was admitted with a SUPRAtherapeutic INR of 5.19. INR down to 2.02 on 12/31 after 2 doses of Vitamin K. Warfarin was resumed 12/31. INR this morning 1.71. Hgb 8.4, Plts 155. No overt bleeding noted.   Goal of Therapy:  INR 2-3 Monitor platelets by anticoagulation protocol: Yes   Plan:  1. Warfarin 2mg  x 1 tonight  2. Daily PT/INR 3. Follow closely for s/s of bleeding, CBC, and acute changes in INR given supratherapeutic admit   Charl Wellen D. Andrell Tallman, PharmD, BCPS Clinical Pharmacist Pager: [phone] 09/15/2013 1:08 PM  ADDENDUM Warfarin consult discontinued in case patient needs thoracentesis for pleural effusion. Orders and labs discontinued.  Leyanna Bittman D. Labrandon Knoch, PharmD, BCPS Clinical Pharmacist Pager: [phone] 09/15/2013 3:19 PM

## 2013-09-15 NOTE — Progress Notes (Signed)
Pt. Seen and examined. Agree with the NP/PA-C note as written.  Excellent uOP overnight - condom cath came off twice, but it would be necessary to use at night to keep him from being incontinent. It was apparently affixed to his leg, but he has leg jerking, therefore, came off. Please re-address this issue tonight. His leg wraps are now loose and will need to be re-wrapped with new guaze today. I would recommend continuing lasix gtts today. Plan repeat PA/LAT CXR tomorrow to assess size of pleural effusion. If small and thoracentesis unlikely, then would restart warfarin. Anticipate d/c in 2-3 more days.  Pixie Casino, MD, [facility] Attending Cardiologist [place]

## 2013-09-15 NOTE — Progress Notes (Signed)
Per PT evaluation- patient is not interested in SNF or CIR- will go home. CSW signing off.  Lorie Phenix. [place], [place]

## 2013-09-15 NOTE — Consult Note (Signed)
Physical Medicine and Rehabilitation Consult Reason for Consult: Deconditioning/multi-medical Referring Physician: [facility]   HPI: [name] is a 68 y.o. right-handed male with history of multiple sclerosis, hypertension, anemia, atrial fibrillation with chronic Coumadin therapy.. Patient use day cane prior to admission. Admitted 09/11/2013 with increased leg swelling intermittent shortness of breath. Findings of hemoglobin 5.9 on admission as well as creatinine 4.26.Marland Kitchen Chest x-ray with some cardiac enlargement without significant vascular congestion or edema. CT abdomen pelvis with large right pleural effusion. Patient was transfused 3 units of packed red blood cells. Patient continued to be maintained on Coumadin therapy and close monitoring of hemoglobin. Patient's renal function has rebounded nicely with gentle IV fluids latest creatinine 1.78. Physical therapy evaluation completed 09/13/2013 with recommendations for physical medicine rehabilitation consult to consider inpatient rehabilitation services  Just finished working with PT. Functional status has improved markedly since prior PT session. Now at a min guard assist level Review of Systems  Cardiovascular: Positive for palpitations and leg swelling.  Genitourinary: Positive for urgency.  Psychiatric/Behavioral: Positive for depression.  All other systems reviewed and are negative.   Past Medical History  Diagnosis Date  . Neuromuscular disorder   . Hypertension   . Anemia   . Depression   . Multiple sclerosis   . Thrombocytopenia   . Dyslipidemia   . Aortic dissection, thoracic   . BPH (benign prostatic hyperplasia)   . Hemorrhoids   . Aortic dissection 11/05/1998    S/P emergency repair of acute type A aortic dissection with resuspension of native aortic valve  . Aneurysm of aortic arch 04/04/2012    Chronic aneurysmal dilatation of aortic arch with chronic type A aortic dissection, s/p replacement of ascending thoracic  aorta  . Chest pain 11/18/2009    2D Echo - EF >55%, right ventricle is moderately dilated, right atrium is severely dilated, moderate-severe tricuspid regurgitation; R/P Myoview - EF 56%, no scintigraphic evidence of inducible myocardial ischemia, EKG negative for ischemia,   . CHF (congestive heart failure) 06/22/47    2D Echo - EF 50-55%, mild-moderately dilated right ventricle, mild-moderate tricuspid valve regurgitation, moderately dilated right atrium  . Bilateral lower extremity edema   . Atrial fibrillation 01/30/2013    Atrial fibrillation   . Pleural effusion, right, large 01/30/2013  . Severe tricuspid valve regurgitation 01/30/2013    Severe tricuspid regurg by recent 2-D echo with a dilated tricuspid annulus, moderate pulmonary hypertension and biatrial enlargement   . Mitral regurgitation 02/20/2013  . Lower extremity edema 02/20/2013  . Heart murmur   . Exertional shortness of breath     "for awhile now" (09/11/2013)  . History of blood transfusion     "related to bleeding rectally"    Past Surgical History  Procedure Laterality Date  . Hemorrhoid surgery  2009    Internal, external hemorrhoidectomy, general anesthesia,prone position. [Other]  . Open reduction and internal fixation of right distal radius fracture using hand innovations distal radius volar locking plate.  07/2005    Dr Ninfa Linden  . Tee without cardioversion N/A 02/17/2013    Procedure: TRANSESOPHAGEAL ECHOCARDIOGRAM (TEE);  Surgeon: [name], MD;  Location: [facility] ENDOSCOPY;  Service: Cardiovascular;  Laterality: N/A;  . Repair of acute type a aortic dissection with resuspension of native aortic valve  10/15/1998    Dr Roxy Manns   Family History  Problem Relation Age of Onset  . Thyroid cancer Father     smoked  . Lung cancer Brother     smoked  .  Heart attack Mother   . Emphysema Brother     smoked   Social History:  reports that he has never smoked. He has never used smokeless tobacco. He reports that he  drinks alcohol. He reports that he does not use illicit drugs. Allergies: No Known Allergies Medications Prior to Admission  Medication Sig Dispense Refill  . dalfampridine (AMPYRA) 10 MG TB12 Take 1 tablet (10 mg total) by mouth 2 (two) times daily.  180 tablet  1  . DIOVAN 320 MG tablet Take 320 mg by mouth daily.       Mariane Baumgarten Calcium (STOOL SOFTENER PO) 1 tablet as needed.       . doxazosin (CARDURA) 4 MG tablet Take 4 mg by mouth at bedtime.        . folic acid (FOLVITE) 1 MG tablet Take 1 mg by mouth daily.        . furosemide (LASIX) 40 MG tablet Take 80 mg by mouth daily. Or 120mg  prn      . metoprolol (LOPRESSOR) 100 MG tablet Take 100 mg by mouth 2 (two) times daily.        . potassium chloride (K-DUR) 10 MEQ tablet Take 10 mEq by mouth daily. 1 tablet every time he takes lasix      . QUEtiapine (SEROQUEL) 25 MG tablet Take 25 mg by mouth at bedtime as needed.       . rosuvastatin (CRESTOR) 5 MG tablet Take 5 mg by mouth daily.        . TECFIDERA 120 & 240 MG MISC Take 1 capsule by mouth as directed.      . warfarin (COUMADIN) 2 MG tablet Take 2 mg by mouth See admin instructions. Take 1 tablet by mouth daily or as directed. Take 1mg  half of the 2mg  on Mondays.      . [DISCONTINUED] warfarin (COUMADIN) 2 MG tablet Take 1 tablet by mouth daily or as directed  90 tablet  1  . amLODipine (NORVASC) 5 MG tablet Take 5 mg by mouth daily.          Home: Home Living Family/patient expects to be discharged to:: Private residence Living Arrangements: Spouse/significant other Available Help at Discharge: Family;Available PRN/intermittently (not sure how much physical assist wife can give) Type of Home: House Home Access: Ramped entrance Home Layout: Two level Alternate Level Stairs-Number of Steps:  (stair chair lift) Home Equipment: Walker - 4 wheels;Cane - single point;Other (comment) (stair lift)  Functional History:   Functional Status:  Mobility: Bed Mobility Bed Mobility:  Supine to Sit;Sitting - Scoot to Edge of Bed Supine to Sit: 4: Min assist Sitting - Scoot to [employer] of Bed: 4: Min assist;With rail Transfers Transfers: Sit to Stand;Stand to Sit Sit to Stand: 4: Min guard;From bed;From chair/3-in-1;3: Mod assist Stand to Sit: 4: Min guard;To toilet;To chair/3-in-1 Ambulation/Gait Ambulation/Gait Assistance: 3: Mod assist;4: Min assist Ambulation Distance (Feet): 8 Feet Assistive device: 4-wheeled walker Ambulation/Gait Assistance Details: Quite unsteady on feet with heavy dependence on UE support Gait Pattern: Decreased stride length;Left genu recurvatum;Shuffle;Ataxic (with erratic step width)    ADL:    Cognition: Cognition Overall Cognitive Status: Impaired/Different from baseline Orientation Level: Oriented X4 Cognition Arousal/Alertness: Awake/alert Behavior During Therapy: WFL for tasks assessed/performed Overall Cognitive Status: Impaired/Different from baseline Area of Impairment: Safety/judgement;Awareness Safety/Judgement: Decreased awareness of safety;Decreased awareness of deficits General Comments: 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 rate 18, height 5\' 8"  (1.727 m), weight 80.105 kg (176 lb 9.6 oz), SpO2 97.00%. Physical Exam  Vitals reviewed. Constitutional: He is oriented to person, place, and time.  HENT:  Head: Normocephalic.  Eyes: EOM are normal.  Neck: Normal range of motion. Neck supple. No thyromegaly present.  Cardiovascular: S1 normal and S2 normal.  An irregularly irregular rhythm present. Exam reveals no gallop.   No murmur heard. Cardiac rate controlled  Respiratory: Effort normal and breath sounds normal. No respiratory distress.  GI: Soft. Bowel sounds are normal. He exhibits no distension.  Musculoskeletal:   +1 edema lower extremities with palpable pedal pulses  Neurological: He is alert and oriented to person, place, and time. He displays no tremor. No cranial nerve deficit or sensory deficit.  Mood is flat but appropriate. He is somewhat slow to process  No ataxia on finger nose finger testing Motor strength is 4/5 bilateral deltoid, bicep, tricep, grip 2 minus in the right hip flexor knee extensor ankle dorsiflexor plantar flexion 3/5 in the left hip flexor knee extensor ankle dorsiflexor and plantar flexor  Skin: Skin is warm and dry.    Results for orders placed during the hospital encounter of 09/11/13 (from the past 24 hour(s))  PROTIME-INR     Status: Abnormal   Collection Time    09/15/13  7:45 AM      Result Value Range   Prothrombin Time 19.6 (*) 11.6 - 15.2 seconds   INR 1.71 (*) 0.00 - [id number]  BASIC METABOLIC PANEL     Status: Abnormal   Collection Time    09/15/13  7:45 AM      Result Value Range   Sodium 150 (*) 137 - 147 mEq/L   Potassium 3.9  3.7 - 5.3 mEq/L   Chloride 112  96 - 112 mEq/L   CO2 22  19 - 32 mEq/L   Glucose, Bld 104 (*) 70 - 99 mg/dL   BUN 66 (*) 6 - 23 mg/dL   Creatinine, Ser 1.78 (*) 0.50 - 1.35 mg/dL   Calcium 8.8  8.4 - 10.5 mg/dL   GFR calc non Af Amer 38 (*) >90 mL/min   GFR calc Af Amer 44 (*) >90 mL/min  CBC     Status: Abnormal   Collection Time    09/15/13  7:45 AM      Result Value Range   WBC 5.5  4.0 - 10.5 K/uL   RBC 3.03 (*) 4.22 - 5.81 MIL/uL   Hemoglobin 8.4 (*) 13.0 - 17.0 g/dL   HCT 25.5 (*) 39.0 - 52.0 %   MCV 84.2  78.0 - 100.0 fL   MCH 27.7  26.0 - 34.0 pg   MCHC 32.9  30.0 - 36.0 g/dL   RDW 16.3 (*) 11.5 - 15.5 %   Platelets 155  150 - 400 K/uL   Dg Knee Right Port  09/14/2013   CLINICAL DATA:  Knee pain after fall.  EXAM: PORTABLE RIGHT KNEE - 1-2 VIEW  COMPARISON:  None.  FINDINGS: Two views of the right knee were obtained. The lateral view is obliqued and limited. However, there is no evidence for fracture or  dislocation. No evidence for a large joint effusion.  IMPRESSION: Limited study without gross abnormality.   Electronically Signed   By: Markus Daft M.D.   On: 09/14/2013 16:42  Assessment/Plan: Diagnosis: Deconditioning associated with anemia and acute renal failure improving 1. Does the need for close, 24 hr/day medical supervision in concert with the patient's rehab needs make it unreasonable for this patient to be served in a less intensive setting? No 2. Co-Morbidities requiring supervision/potential complications: Multiple sclerosis, atrial fibrillation 3. Due to bladder management, bowel management and safety, does the patient require 24 hr/day rehab nursing? No 4. Does the patient require coordinated care of a physician, rehab nurse, NA to address physical and functional deficits in the context of the above medical diagnosis(es)? No Addressing deficits in the following areas: balance, endurance, locomotion, strength, transferring, bowel/bladder control, bathing, dressing and toileting 5. Can the patient actively participate in an intensive therapy program of at least 3 hrs of therapy per day at least 5 days per week? Yes 6. The potential for patient to make measurable gains while on inpatient rehab is NA 7. Anticipated functional outcomes upon discharge from inpatient rehab are NA with PT, NA with OT, NA with SLP. 8. Estimated rehab length of stay to reach the above functional goals is: NA 9. Does the patient have adequate social supports to accommodate these discharge functional goals? Yes 10. Anticipated D/C setting: Home 11. Anticipated post D/C treatments: Outpt therapy 12. Overall Rehab/Functional Prognosis: good  RECOMMENDATIONS: This patient's condition is appropriate for continued rehabilitative care in the following setting: Outpt Patient has agreed to participate in recommended program. Yes Note that insurance prior authorization may be required for reimbursement for  recommended care.  Comment: Wife can provide transportation.    09/15/2013

## 2013-09-15 NOTE — Progress Notes (Signed)
Physical Therapy Treatment Patient Details Name: KEEN BOSO MRN: [id number] DOB: Feb 22, 1946 Today's Date: 09/15/2013 Time: [id number] PT Time Calculation (min): 18 min  PT Assessment / Plan / Recommendation  History of Present Illness DONTARIO PRZYBYLSKI is an 68 y.o. male with a history of multiple sclerosis, hypertension, dyslipidemia, congestive heart failure, atrial fibrillation on anticoagulation, and mitral regurgitation was admitted with increasing edema involving lower extremities. Neurology consultation was obtained because of increasing problems with his gait. He's become increasingly unstable and has experienced multiple falls over the past several weeks. He has resorted to using a rolling walker over the past several days with no recurrent falls. Previously he had been using a cane   PT Comments   Pt with improved mobility, still requires min A for safety and balance. Pt states he is not willing to go to rehab but pt will require 24 hour supervision at home due to high risk for falls.  Follow Up Recommendations  Supervision/Assistance - 24 hour;Home health PT (pt states he is not willing to go to rehab)     Does the patient have the potential to tolerate intense rehabilitation     Barriers to Discharge        Equipment Recommendations   (TBD)    Recommendations for Other Services    Frequency Min 3X/week   Progress towards PT Goals Progress towards PT goals: Progressing toward goals  Plan Current plan remains appropriate    Precautions / Restrictions Precautions Precautions: Fall Restrictions Weight Bearing Restrictions: No   Pertinent Vitals/Pain No c/o pain    Mobility  Transfers Sit to Stand: 4: Min guard Stand to Sit: 4: Min guard Ambulation/Gait Ambulation/Gait Assistance: 4: Min assist Ambulation Distance (Feet): 60 Feet Assistive device: 4-wheeled walker Ambulation/Gait Assistance Details: manual facilitation for grading movement, pt with stiff legs  during gait    Exercises     PT Diagnosis:    PT Problem List:   PT Treatment Interventions:     PT Goals (current goals can now be found in the care plan section)    Visit Information  Last PT Received On: 09/15/13 Assistance Needed: +1 History of Present Illness: ALEXANER FASIG is an 68 y.o. male with a history of multiple sclerosis, hypertension, dyslipidemia, congestive heart failure, atrial fibrillation on anticoagulation, and mitral regurgitation was admitted with increasing edema involving lower extremities. Neurology consultation was obtained because of increasing problems with his gait. He's become increasingly unstable and has experienced multiple falls over the past several weeks. He has resorted to using a rolling walker over the past several days with no recurrent falls. Previously he had been using a cane    Subjective Data      Cognition  Cognition Behavior During Therapy: WFL for tasks assessed/performed Area of Impairment: Safety/judgement;Awareness Safety/Judgement: Decreased awareness of safety;Decreased awareness of deficits Awareness: Intellectual    Balance  Static Standing Balance Static Standing - Balance Support: Bilateral upper extremity supported Static Standing - Level of Assistance: 4: Min assist Static Standing - Comment/# of Minutes: standing to don/doff gown and for hygiene with toileting  End of Session PT - End of Session Equipment Utilized During Treatment: Gait belt Activity Tolerance: Patient tolerated treatment well Patient left: in bed;with nursing/sitter in room;with call bell/phone within reach Nurse Communication: Mobility status   GP     Westlynn Fifer 09/15/2013, 2:34 PM

## 2013-09-16 ENCOUNTER — Inpatient Hospital Stay (HOSPITAL_COMMUNITY): Payer: Medicare Other

## 2013-09-16 DIAGNOSIS — I455 Other specified heart block: Secondary | ICD-10-CM

## 2013-09-16 LAB — BASIC METABOLIC PANEL
BUN: 53 mg/dL — ABNORMAL HIGH (ref 6–23)
CO2: 24 mEq/L (ref 19–32)
Calcium: 8.9 mg/dL (ref 8.4–10.5)
Chloride: 113 mEq/L — ABNORMAL HIGH (ref 96–112)
Creatinine, Ser: 1.67 mg/dL — ABNORMAL HIGH (ref 0.50–1.35)
GFR calc Af Amer: 48 mL/min — ABNORMAL LOW (ref >90)
GFR calc non Af Amer: 41 mL/min — ABNORMAL LOW (ref >90)
Glucose, Bld: 93 mg/dL (ref 70–99)
Potassium: 3.7 mEq/L (ref 3.7–5.3)
Sodium: 149 mEq/L — ABNORMAL HIGH (ref 137–147)

## 2013-09-16 LAB — CBC
HCT: 26.4 % — ABNORMAL LOW (ref 39.0–52.0)
Hemoglobin: 8.4 g/dL — ABNORMAL LOW (ref 13.0–17.0)
MCH: 27.4 pg (ref 26.0–34.0)
MCHC: 31.8 g/dL (ref 30.0–36.0)
MCV: 86 fL (ref 78.0–100.0)
Platelets: 156 10*3/uL (ref 150–400)
RBC: 3.07 MIL/uL — ABNORMAL LOW (ref 4.22–5.81)
RDW: 16.6 % — ABNORMAL HIGH (ref 11.5–15.5)
WBC: 5.8 10*3/uL (ref 4.0–10.5)

## 2013-09-16 LAB — PROTIME-INR
INR: 1.82 — ABNORMAL HIGH (ref 0.00–1.49)
Prothrombin Time: 20.5 seconds — ABNORMAL HIGH (ref 11.6–15.2)

## 2013-09-16 LAB — PRO B NATRIURETIC PEPTIDE: Pro B Natriuretic peptide (BNP): 3923 pg/mL — ABNORMAL HIGH (ref 0–125)

## 2013-09-16 MED ORDER — WARFARIN - PHARMACIST DOSING INPATIENT
Freq: Every day | Status: DC
  Administered 2013-09-16 – 2013-09-17 (×2)

## 2013-09-16 MED ORDER — WARFARIN SODIUM 2 MG PO TABS
2.0000 mg | ORAL_TABLET | Freq: Once | ORAL | Status: AC
  Administered 2013-09-16: 2 mg via ORAL
  Filled 2013-09-16: qty 1

## 2013-09-16 NOTE — Progress Notes (Signed)
ANTICOAGULATION CONSULT NOTE - Follow Up Consult  Pharmacy Consult for warfarin Indication: atrial fibrillation  No Known Allergies  Patient Measurements: Height: 5\' 8"  (172.7 cm) Weight: 179 lb (81.194 kg) IBW/kg (Calculated) : 68.4   Vital Signs: Temp: 98.3 F (36.8 C) (01/03 0617) Temp src: Oral (01/03 0617) BP: 132/64 mmHg (01/03 0617) Pulse Rate: 73 (01/03 0617)  Labs:  Recent Labs  09/14/13 0605 09/15/13 0745 09/16/13 0355  HGB 7.9* 8.4* 8.4*  HCT 24.1* 25.5* 26.4*  PLT 151 155 156  LABPROT 19.1* 19.6* 20.5*  INR 1.66* 1.71* 1.82*  CREATININE 2.31* 1.78* 1.67*    Estimated Creatinine Clearance: 42.1 ml/min (by C-G formula based on Cr of 1.67).   Medications:  Scheduled:  . atorvastatin  10 mg Oral q1800  . dalfampridine  10 mg Oral BID  . Dimethyl Fumarate  1 capsule Oral BID  . ferrous sulfate  325 mg Oral TID WC  . folic acid  1 mg Oral Daily    Assessment: 62 YOM with MS who was admitted with a SUPRAtherapeutic INR of 5.19. INR down to 2.02 on 12/31 after 2 doses of Vitamin K. Warfarin was resumed 12/31. INR SUBtherapeutic this morning at 1.82 (of note, did not receive dose of warfarin on 1/2 d/t possibility for thoracentesis).  Hgb remains stable at 8.4, Plts 156. No overt bleeding noted.   Home dose: 2 mg daily except 1 mg on Monday  Goal of Therapy:  INR 2-3 Monitor platelets by anticoagulation protocol: Yes   Plan:  1. Warfarin 2mg  x 1 tonight  2. Daily PT/INR 3. Follow closely for s/s of bleeding, CBC, and acute changes in INR given supratherapeutic admit   Harolyn Rutherford, PharmD Clinical Pharmacist - Resident Pager: [phone] Pharmacy: [phone] 09/16/2013 10:42 AM

## 2013-09-16 NOTE — Progress Notes (Signed)
DAILY PROGRESS NOTE  Subjective:  Noted that he had a >5 sec pause overnight - a-fib.  Patient was asleep at the time. ?if he was briefly apneic. Continued diuresis overnight - now out -2.8L over the past 2 days. Creatinine continues to improve (down to 1.67 today from 4.26 - on 12/29). BNP is up and weight is reported to be up 3 lbs - this does not correlate clinically. CXR today shows a persistent but decreasing right pleural effusion.  Objective:  Temp:  [98 F (36.7 C)-98.9 F (37.2 C)] 98.3 F (36.8 C) (01/03 0617) Pulse Rate:  [73-92] 73 (01/03 0617) Resp:  [18-20] 20 (01/03 0617) BP: (100-132)/(57-83) 132/64 mmHg (01/03 0617) SpO2:  [99 %-100 %] 100 % (01/03 0617) Weight:  [179 lb (81.194 kg)] 179 lb (81.194 kg) (01/03 0617) Weight change: 2 lb 6.4 oz (1.089 kg)  Intake/Output from previous day: 01/02 0701 - 01/03 0700 In: 1521.8 [P.O.:1380; I.V.:141.8] Out: 2401 [Urine:2400; Stool:1]  Intake/Output from this shift:    Medications: Current Facility-Administered Medications  Medication Dose Route Frequency Provider Last Rate Last Dose  . 0.9 %  sodium chloride infusion   Intravenous Continuous Oswald Hillock, MD      . acetaminophen (TYLENOL) tablet 650 mg  650 mg Oral Q6H PRN Oswald Hillock, MD       Or  . acetaminophen (TYLENOL) suppository 650 mg  650 mg Rectal Q6H PRN Oswald Hillock, MD      . atorvastatin (LIPITOR) tablet 10 mg  10 mg Oral q1800 Oswald Hillock, MD   10 mg at 09/15/13 1709  . dalfampridine TB12 10 mg  10 mg Oral BID Oswald Hillock, MD   10 mg at 09/15/13 2228  . Dimethyl Fumarate MISC 1 capsule  1 capsule Oral BID Oswald Hillock, MD   1 capsule at 09/15/13 2228  . docusate sodium (COLACE) capsule 100 mg  100 mg Oral Daily PRN Oswald Hillock, MD      . ferrous sulfate tablet 325 mg  325 mg Oral TID WC Caren Griffins, MD   325 mg at 09/16/13 0355  . folic acid (FOLVITE) tablet 1 mg  1 mg Oral Daily Oswald Hillock, MD   1 mg at 09/15/13 1040  . furosemide  (LASIX) 250 mg in dextrose 5 % 250 mL infusion  4 mg/hr Intravenous Continuous Pixie Casino, MD 4 mL/hr at 09/14/13 1002 4 mg/hr at 09/14/13 1002  . QUEtiapine (SEROQUEL) tablet 25 mg  25 mg Oral QHS PRN Oswald Hillock, MD   25 mg at 09/12/13 2205    Physical Exam: General appearance: alert and no distress Neck: JVP ~6 cm H20 - prominent A wave Lungs: clear to auscultation bilaterally Heart: irregularly irregular rhythm Abdomen: less protuberant Extremities: edema 2+ bilateral pitting edema, wraps in place Pulses: 2+ and symmetric Skin: mildly pale, warm, dry Neurologic: Mental status: Alert, oriented, thought content appropriate  Lab Results: Results for orders placed during the hospital encounter of 09/11/13 (from the past 48 hour(s))  PROTIME-INR     Status: Abnormal   Collection Time    09/15/13  7:45 AM      Result Value Range   Prothrombin Time 19.6 (*) 11.6 - 15.2 seconds   INR 1.71 (*) 0.00 - 9.74  BASIC METABOLIC PANEL     Status: Abnormal   Collection Time    09/15/13  7:45 AM      Result Value Range  Sodium 150 (*) 137 - 147 mEq/L   Comment: Please note change in reference range.   Potassium 3.9  3.7 - 5.3 mEq/L   Comment: Please note change in reference range.   Chloride 112  96 - 112 mEq/L   CO2 22  19 - 32 mEq/L   Glucose, Bld 104 (*) 70 - 99 mg/dL   BUN 66 (*) 6 - 23 mg/dL   Creatinine, Ser 1.78 (*) 0.50 - 1.35 mg/dL   Calcium 8.8  8.4 - 10.5 mg/dL   GFR calc non Af Amer 38 (*) >90 mL/min   GFR calc Af Amer 44 (*) >90 mL/min   Comment: (NOTE)     The eGFR has been calculated using the CKD EPI equation.     This calculation has not been validated in all clinical situations.     eGFR's persistently <90 mL/min signify possible Chronic Kidney     Disease.  CBC     Status: Abnormal   Collection Time    09/15/13  7:45 AM      Result Value Range   WBC 5.5  4.0 - 10.5 K/uL   RBC 3.03 (*) 4.22 - 5.81 MIL/uL   Hemoglobin 8.4 (*) 13.0 - 17.0 g/dL   HCT 25.5  (*) 39.0 - 52.0 %   MCV 84.2  78.0 - 100.0 fL   MCH 27.7  26.0 - 34.0 pg   MCHC 32.9  30.0 - 36.0 g/dL   RDW 16.3 (*) 11.5 - 15.5 %   Platelets 155  150 - 400 K/uL  GLUCOSE, CAPILLARY     Status: None   Collection Time    09/15/13  8:22 PM      Result Value Range   Glucose-Capillary 99  70 - 99 mg/dL  PROTIME-INR     Status: Abnormal   Collection Time    09/16/13  3:55 AM      Result Value Range   Prothrombin Time 20.5 (*) 11.6 - 15.2 seconds   INR 1.82 (*) 0.00 - 1.44  BASIC METABOLIC PANEL     Status: Abnormal   Collection Time    09/16/13  3:55 AM      Result Value Range   Sodium 149 (*) 137 - 147 mEq/L   Comment: Please note change in reference range.   Potassium 3.7  3.7 - 5.3 mEq/L   Comment: Please note change in reference range.   Chloride 113 (*) 96 - 112 mEq/L   CO2 24  19 - 32 mEq/L   Glucose, Bld 93  70 - 99 mg/dL   BUN 53 (*) 6 - 23 mg/dL   Creatinine, Ser 1.67 (*) 0.50 - 1.35 mg/dL   Calcium 8.9  8.4 - 10.5 mg/dL   GFR calc non Af Amer 41 (*) >90 mL/min   GFR calc Af Amer 48 (*) >90 mL/min   Comment: (NOTE)     The eGFR has been calculated using the CKD EPI equation.     This calculation has not been validated in all clinical situations.     eGFR's persistently <90 mL/min signify possible Chronic Kidney     Disease.  CBC     Status: Abnormal   Collection Time    09/16/13  3:55 AM      Result Value Range   WBC 5.8  4.0 - 10.5 K/uL   RBC 3.07 (*) 4.22 - 5.81 MIL/uL   Hemoglobin 8.4 (*) 13.0 - 17.0 g/dL   HCT 26.4 (*)  39.0 - 52.0 %   MCV 86.0  78.0 - 100.0 fL   MCH 27.4  26.0 - 34.0 pg   MCHC 31.8  30.0 - 36.0 g/dL   RDW 16.6 (*) 11.5 - 15.5 %   Platelets 156  150 - 400 K/uL  PRO B NATRIURETIC PEPTIDE     Status: Abnormal   Collection Time    09/16/13  3:55 AM      Result Value Range   Pro B Natriuretic peptide (BNP) 3923.0 (*) 0 - 125 pg/mL    Imaging: Dg Chest 2 View  09/16/2013   CLINICAL DATA:  CHF, pleural effusion  EXAM: CHEST  2 VIEW   COMPARISON:  Prior chest x-ray 09/11/2013; CT abdomen/ pelvis including lung bases 09/12/2013  FINDINGS: Persistent but decreased right pleural effusion with residual right basilar opacity. Stable cardiomegaly. Tortuous and atherosclerotic thoracic aorta. Mild pulmonary vascular congestion without overt edema. No pneumothorax.  IMPRESSION: 1. Decreasing right pleural effusion with persistent right basilar atelectasis. 2. Mild pulmonary vascular congestion without overt edema.   Electronically Signed   By: Jacqulynn Cadet M.D.   On: 09/16/2013 08:47   Dg Knee Right Port  09/14/2013   CLINICAL DATA:  Knee pain after fall.  EXAM: PORTABLE RIGHT KNEE - 1-2 VIEW  COMPARISON:  None.  FINDINGS: Two views of the right knee were obtained. The lateral view is obliqued and limited. However, there is no evidence for fracture or dislocation. No evidence for a large joint effusion.  IMPRESSION: Limited study without gross abnormality.   Electronically Signed   By: Markus Daft M.D.   On: 09/14/2013 16:42    Assessment:  1. Principal Problem: 2.   Anasarca 3. Active Problems: 4.   Aortic dissection- chronic abd dissection  5.   MS (multiple sclerosis) 6.   Persistent atrial fibrillation 7.   Severe tricuspid valve regurgitation 8.   Dissection of aorta, thoracic- surgery Feb 2000 9.   Long term (current) use of anticoagulants 10.   Bilateral leg edema 11.   Acute right-sided congestive heart failure 12.   Unstable gait 13.   Anemia due to blood loss, acute in setting of supratheraputic INR 09/11/13 14.   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 15.   Acute renal insufficiency 16.   Anemia 17.   Plan:  1. Continues to diurese - would continue lasix gtts today and consider switching to oral lasix tomorrow. Does not appear that he will require a thoracentesis, therefore, warfarin can be restarted by pharmacy tonight. Will need accurate weight measurement tomorrow - cannot understand how weight is  increasing with net negative I's/O's.  With regards to pause overnight, would continue to monitor - will order overnight oximetry (sleep study surrogate - to r/o OSA as a possible cause - may be solely due to conduction system disease), no urgent need for pacemaker.  Time Spent Directly with Patient:  15 minutes  Length of Stay:  LOS: 5 days   Pixie Casino, MD, [facility] Attending Cardiologist [facility]  HILTY,Kenneth C 09/16/2013, 9:33 AM

## 2013-09-16 NOTE — Progress Notes (Addendum)
PROGRESS NOTE  [name] [id number] DOB: 1946-02-03 DOA: 09/11/2013 PCP: Criselda Peaches, MD  Assessment/Plan: Acute on chronic anemia - Unclear etiology. No overt blood loss. No reported melena or vomiting. He reports a history of hemorrhoidal bleeding in the past, massive, but none recently. - CT abdomen without contrast does not show retroperitoneal hematoma. - Status post 3 units total PRBCs.  - iron studies borderline low, will start iron supplements.  - stable Anasarca/acute right-sided CHF/severe TR/moderate MR - Secondary to decompensated right-sided heart failure from severe tricuspid regurgitation. Diuretics have not been started initially due to relative hypotension and soft blood pressures this morning in the context of acute renal failure. His hemodynamics seems to improve slightly on 09/14/13 and he was started on a Lasix gtt.  - Not felt to be a surgical candidate. Cardiology is reviewing options for second opinion regarding valve surgery.  - R sided pleural effusion, repeat CXR 1/3 with slight improvement. Continue diuresis.  Persistent A. Fib  - Controlled ventricular rate.  - Currently on no rate control medications. Consider resuming metoprolol as blood pressure tolerates. - resume Coumadin Acute renal failure- Likely multifactorial from hypotension, diuresis and ARB. These medications are on hold. - Improving. No hydronephrosis seen on CT abdomen. Hypotension - Management as above. Improving. Multiple sclerosis - Neurology consultation appreciated. Signed off 12/30.  Code Status: Full Family Communication: none Disposition Plan: To be determined.  Consultants:  Neurology  Cardiology  Procedures:  None  Antibiotics:  None   Subjective: - no complaints, denies chest pain.    Objective: Filed Vitals:   09/15/13 2055 09/16/13 0219 09/16/13 0243 09/16/13 0617  BP: 100/57 110/83 123/65 132/64  Pulse: 90 92 89 73  Temp: 98.7 F (37.1 C)  98.9 F (37.2 C) 98.4 F (36.9 C) 98.3 F (36.8 C)  TempSrc: Oral Oral Oral Oral  Resp: 20 20 18 20   Height:      Weight:    81.194 kg (179 lb)  SpO2: 99% 100% 99% 100%    Intake/Output Summary (Last 24 hours) at 09/16/13 0856 Last data filed at 09/16/13 [id number]  Gross per 24 hour  Intake    900 ml  Output   2401 ml  Net  -1501 ml   Filed Weights   09/14/13 0422 09/15/13 0446 09/16/13 0617  Weight: 84.1 kg (185 lb 6.5 oz) 80.105 kg (176 lb 9.6 oz) 81.194 kg (179 lb)   Exam: General exam: Moderately built and nourished male. Respiratory system: Slightly reduced breath sounds in the bases with occasional basal crackles. No increased work of breathing. Cardiovascular system: S1 & S2 heard, irregularly irregular. No JVD, gallops, clicks. 1+ pitting bilateral leg edema extending to thighs. 3/6 systolic murmur at apex. Gastrointestinal system: Abdomen is mildly distended, soft and nontender.  Central nervous system: Alert and oriented. No focal neurological deficits. Extremities: Symmetric 4 x 5 power upper extremity but reduced power in lower extremity.  Data Reviewed: Basic Metabolic Panel:  Recent Labs Lab 09/12/13 0900 09/13/13 0515 09/14/13 0605 09/15/13 0745 09/16/13 0355  NA 143 145 148* 150* 149*  K 4.7 4.9 4.6 3.9 3.7  CL 106 110 115* 112 113*  CO2 21 21 20 22 24   GLUCOSE 116* 93 96 104* 93  BUN 113* 112* 90* 66* 53*  CREATININE 4.08* 3.46* 2.31* 1.78* 1.67*  CALCIUM 8.7 8.4 8.4 8.8 8.9   Liver Function Tests:  Recent Labs Lab 09/11/13 1940 09/12/13 0900  AST 20 15  ALT 19  17  ALKPHOS 298* 306*  BILITOT 0.7 0.8  PROT 6.9 6.6  ALBUMIN 3.4* 3.2*   CBC:  Recent Labs Lab 09/13/13 0515 09/13/13 1923 09/14/13 0605 09/15/13 0745 09/16/13 0355  WBC 6.4 6.8 6.9 5.5 5.8  HGB 7.0* 8.1* 7.9* 8.4* 8.4*  HCT 21.1* 24.9* 24.1* 25.5* 26.4*  MCV 82.4 84.1 84.0 84.2 86.0  PLT 138* 146* 151 155 156   BNP (last 3 results)  Recent Labs  09/11/13 1940  09/13/13 0515 09/16/13 0355  PROBNP 3366.0* 2647.0* 3923.0*   Studies: Dg Chest 2 View  09/16/2013   CLINICAL DATA:  CHF, pleural effusion  EXAM: CHEST  2 VIEW  COMPARISON:  Prior chest x-ray 09/11/2013; CT abdomen/ pelvis including lung bases 09/12/2013  FINDINGS: Persistent but decreased right pleural effusion with residual right basilar opacity. Stable cardiomegaly. Tortuous and atherosclerotic thoracic aorta. Mild pulmonary vascular congestion without overt edema. No pneumothorax.  IMPRESSION: 1. Decreasing right pleural effusion with persistent right basilar atelectasis. 2. Mild pulmonary vascular congestion without overt edema.   Electronically Signed   By: Jacqulynn Cadet M.D.   On: 09/16/2013 08:47   Dg Knee Right Port  09/14/2013   CLINICAL DATA:  Knee pain after fall.  EXAM: PORTABLE RIGHT KNEE - 1-2 VIEW  COMPARISON:  None.  FINDINGS: Two views of the right knee were obtained. The lateral view is obliqued and limited. However, there is no evidence for fracture or dislocation. No evidence for a large joint effusion.  IMPRESSION: Limited study without gross abnormality.   Electronically Signed   By: Markus Daft M.D.   On: 09/14/2013 16:42   Scheduled Meds: . atorvastatin  10 mg Oral q1800  . dalfampridine  10 mg Oral BID  . Dimethyl Fumarate  1 capsule Oral BID  . ferrous sulfate  325 mg Oral TID WC  . folic acid  1 mg Oral Daily   Continuous Infusions: . sodium chloride    . furosemide (LASIX) infusion 4 mg/hr (09/14/13 1002)   Principal Problem:   Anasarca Active Problems:   Aortic dissection- chronic abd dissection    MS (multiple sclerosis)   Persistent atrial fibrillation   Severe tricuspid valve regurgitation   Dissection of aorta, thoracic- surgery Feb 2000   Long term (current) use of anticoagulants   Bilateral leg edema   Acute right-sided congestive heart failure   Unstable gait   Anemia due to blood loss, acute in setting of supratheraputic INR 09/11/13   Aortic  arch dissection- chronic type A dissection   Acute renal insufficiency   Anemia  Time spent: 25 minutes.  Tenisha Fleece M. Cruzita Lederer, MD [facility] [phone]  If 7PM-7AM, please contact night-coverage [email or web address] Password [email or web address] 09/16/2013, 8:56 AM    LOS: 5 days

## 2013-09-16 NOTE — Progress Notes (Signed)
The patient had a 5.83 second pause on the monitor at 0240.  He was asymptomatic and stated that he was having a nightmare at the time.  The patient is in A. Fib on the monitor.  VS are stable and were charted.  The [facility] NP on call was notified.  The nurse was asked to consult Cardiology.  No new orders were given from Cardiology.  The RN will continue to monitor the patient.

## 2013-09-17 DIAGNOSIS — I71019 Dissection of thoracic aorta, unspecified: Secondary | ICD-10-CM

## 2013-09-17 DIAGNOSIS — I7101 Dissection of thoracic aorta: Secondary | ICD-10-CM

## 2013-09-17 LAB — CBC
HCT: 27.2 % — ABNORMAL LOW (ref 39.0–52.0)
Hemoglobin: 8.7 g/dL — ABNORMAL LOW (ref 13.0–17.0)
MCH: 27.5 pg (ref 26.0–34.0)
MCHC: 32 g/dL (ref 30.0–36.0)
MCV: 86.1 fL (ref 78.0–100.0)
Platelets: 163 10*3/uL (ref 150–400)
RBC: 3.16 MIL/uL — ABNORMAL LOW (ref 4.22–5.81)
RDW: 16.7 % — ABNORMAL HIGH (ref 11.5–15.5)
WBC: 5.8 10*3/uL (ref 4.0–10.5)

## 2013-09-17 LAB — BASIC METABOLIC PANEL
BUN: 44 mg/dL — ABNORMAL HIGH (ref 6–23)
CO2: 26 mEq/L (ref 19–32)
Calcium: 8.8 mg/dL (ref 8.4–10.5)
Chloride: 111 mEq/L (ref 96–112)
Creatinine, Ser: 1.61 mg/dL — ABNORMAL HIGH (ref 0.50–1.35)
GFR calc Af Amer: 50 mL/min — ABNORMAL LOW (ref >90)
GFR calc non Af Amer: 43 mL/min — ABNORMAL LOW (ref >90)
Glucose, Bld: 88 mg/dL (ref 70–99)
Potassium: 3.8 mEq/L (ref 3.7–5.3)
Sodium: 149 mEq/L — ABNORMAL HIGH (ref 137–147)

## 2013-09-17 LAB — PROTIME-INR
INR: 1.87 — ABNORMAL HIGH (ref 0.00–1.49)
Prothrombin Time: 21 seconds — ABNORMAL HIGH (ref 11.6–15.2)

## 2013-09-17 LAB — OCCULT BLOOD X 1 CARD TO LAB, STOOL: Fecal Occult Bld: POSITIVE — AB

## 2013-09-17 MED ORDER — FUROSEMIDE 40 MG PO TABS
40.0000 mg | ORAL_TABLET | Freq: Two times a day (BID) | ORAL | Status: DC
  Administered 2013-09-17 – 2013-09-18 (×2): 40 mg via ORAL
  Filled 2013-09-17 (×4): qty 1

## 2013-09-17 MED ORDER — WARFARIN SODIUM 2 MG PO TABS
2.0000 mg | ORAL_TABLET | Freq: Once | ORAL | Status: AC
  Administered 2013-09-17: 2 mg via ORAL
  Filled 2013-09-17: qty 1

## 2013-09-17 NOTE — Progress Notes (Signed)
Subjective:   Objective: Vital signs in last 24 hours: Temp:  [97 F (36.1 C)-98.2 F (36.8 C)] 98.2 F (36.8 C) (01/04 0538) Pulse Rate:  [76-80] 80 (01/04 0538) Resp:  [20] 20 (01/04 0538) BP: (119-132)/(59-73) 126/73 mmHg (01/04 0538) SpO2:  [98 %-100 %] 98 % (01/04 0538) Weight:  [176 lb 12.9 oz (80.2 kg)] 176 lb 12.9 oz (80.2 kg) (01/04 0538) Weight change: -2 lb 3.1 oz (-0.994 kg) Last BM Date: 09/16/13 Intake/Output from previous day: +139 (-859 since admit) wt 176.12 from 190 on admit) 01/03 0701 - 01/04 0700 In: 840 [P.O.:840] Out: 701 [Urine:700; Stool:1] Intake/Output this shift: Total I/O In: 480 [P.O.:480] Out: -   PE:   Per MD: General:Pleasant affect, NAD Skin:Warm and dry, brisk capillary refill HEENT:normocephalic, sclera clear, mucus membranes moist Neck:supple, no JVD, no bruits  Heart:S1S2 RRR without murmur, gallup, rub or click Lungs:clear without rales, rhonchi, or wheezes [id number], non tender, + BS, do not palpate liver spleen or masses Ext:no lower ext edema, 2+ pedal pulses, 2+ radial pulses Neuro:alert and oriented, MAE, follows commands, + facial symmetry   Lab Results:  Recent Labs  09/16/13 0355 09/17/13 0400  WBC 5.8 5.8  HGB 8.4* 8.7*  HCT 26.4* 27.2*  PLT 156 163   BMET  Recent Labs  09/16/13 0355 09/17/13 0400  NA 149* 149*  K 3.7 3.8  CL 113* 111  CO2 24 26  GLUCOSE 93 88  BUN 53* 44*  CREATININE 1.67* 1.61*  CALCIUM 8.9 8.8     Studies/Results: Dg Chest 2 View  09/16/2013   CLINICAL DATA:  CHF, pleural effusion  EXAM: CHEST  2 VIEW  COMPARISON:  Prior chest x-ray 09/11/2013; CT abdomen/ pelvis including lung bases 09/12/2013  FINDINGS: Persistent but decreased right pleural effusion with residual right basilar opacity. Stable cardiomegaly. Tortuous and atherosclerotic thoracic aorta. Mild pulmonary vascular congestion without overt edema. No pneumothorax.  IMPRESSION: 1. Decreasing right pleural  effusion with persistent right basilar atelectasis. 2. Mild pulmonary vascular congestion without overt edema.   Electronically Signed   By: Jacqulynn Cadet M.D.   On: 09/16/2013 08:47    Medications: I have reviewed the patient's current medications. Scheduled Meds: . atorvastatin  10 mg Oral q1800  . dalfampridine  10 mg Oral BID  . Dimethyl Fumarate  1 capsule Oral BID  . ferrous sulfate  325 mg Oral TID WC  . folic acid  1 mg Oral Daily  . warfarin  2 mg Oral ONCE-1800  . Warfarin - Pharmacist Dosing Inpatient   Does not apply q1800   Continuous Infusions: . sodium chloride    . furosemide (LASIX) infusion 4 mg/hr (09/14/13 1002)   PRN Meds:.acetaminophen, acetaminophen, docusate sodium, QUEtiapine  Assessment/Plan: Principal Problem:   Anasarca Active Problems:   Aortic dissection- chronic abd dissection    MS (multiple sclerosis)   Persistent atrial fibrillation   Severe tricuspid valve regurgitation   Dissection of aorta, thoracic- surgery Feb 2000   Long term (current) use of anticoagulants   Bilateral leg edema   Acute right-sided congestive heart failure   Unstable gait   Anemia due to blood loss, acute in setting of supratheraputic INR 09/11/13   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   Acute renal insufficiency   Anemia   Sinus pause  PLAN: stop IV lasix, plan for discharge tomorrow.  LOS: 6 days   Time spent with pt. :15 minutes. INGOLD,LAURA R  Nurse Practitioner Certified Pager [phone] or after 5pm and on weekends call [phone] 09/17/2013, 9:24 AM

## 2013-09-17 NOTE — Progress Notes (Signed)
ANTICOAGULATION CONSULT NOTE - Follow Up Consult  Pharmacy Consult for warfarin Indication: atrial fibrillation  No Known Allergies  Patient Measurements: Height: 5\' 8"  (172.7 cm) Weight: 176 lb 12.9 oz (80.2 kg) IBW/kg (Calculated) : 68.4   Vital Signs: Temp: 98.2 F (36.8 C) (01/04 0538) Temp src: Oral (01/04 0538) BP: 126/73 mmHg (01/04 0538) Pulse Rate: 80 (01/04 0538)  Labs:  Recent Labs  09/15/13 0745 09/16/13 0355 09/17/13 0400  HGB 8.4* 8.4* 8.7*  HCT 25.5* 26.4* 27.2*  PLT 155 156 163  LABPROT 19.6* 20.5* 21.0*  INR 1.71* 1.82* 1.87*  CREATININE 1.78* 1.67* 1.61*    Estimated Creatinine Clearance: 43.7 ml/min (by C-G formula based on Cr of 1.61).   Medications:  Scheduled:  . atorvastatin  10 mg Oral q1800  . dalfampridine  10 mg Oral BID  . Dimethyl Fumarate  1 capsule Oral BID  . ferrous sulfate  325 mg Oral TID WC  . folic acid  1 mg Oral Daily  . Warfarin - Pharmacist Dosing Inpatient   Does not apply q1800    Assessment: 51 YOM with MS who was admitted with a SUPRAtherapeutic INR of 5.19. INR down to 2.02 on 12/31 after 2 doses of Vitamin K. Warfarin was resumed 12/31. INR remains SUBtherapeutic this morning at 1.87 (of note, did not receive dose of warfarin on 1/2 d/t possibility for thoracentesis).  Hgb remains stable at 8.7, Plts 163. No overt bleeding noted.   Home dose: 2 mg daily except 1 mg on Monday  Goal of Therapy:  INR 2-3 Monitor platelets by anticoagulation protocol: Yes   Plan:  1. Warfarin 2mg  x 1 tonight -- will continue to dose cautiously given recent history 2. Daily PT/INR 3. Follow closely for s/s of bleeding, CBC, and acute changes in INR given supratherapeutic admit   Harolyn Rutherford, PharmD Clinical Pharmacist - Resident Pager: [phone] Pharmacy: [phone] 09/17/2013 7:54 AM

## 2013-09-17 NOTE — Progress Notes (Signed)
Patient has dime sized area of broken skin on left buttock. Mepilex dressing applied.

## 2013-09-17 NOTE — Progress Notes (Signed)
I changed the Gauze wrap(Kerlex) dressing and the Ace Bandage dressing on pt.'s legs bilaterally per pt.'s and doctor's request.

## 2013-09-17 NOTE — Progress Notes (Addendum)
Pt. Seen and examined. Agree with the NP/PA-C note as written.  No events overnight. Weight continues to come down. Plan to switch to po lasix today. Re-wrap legs with new kerlex and ACE bandages today. Can probably d/c condom catheter. Since he is considered too high risk for valve surgery, may be a good candidate for follow-up with [name] in the advanced CHF clinic. FOBT positive - may be due to hemorrhoids, he denies melena or BRBPR.  Reports he is due for colonoscopy with [facility]. Would recommend he have that sooner, than later since warfarin is being restarted.  Pixie Casino, MD, [facility] Attending Cardiologist [place]

## 2013-09-17 NOTE — Progress Notes (Signed)
PROGRESS NOTE  [name] [id number] DOB: June 14, 1946 DOA: 09/11/2013 PCP: Criselda Peaches, MD  Assessment/Plan: Acute on chronic anemia - Unclear etiology. No overt blood loss. No reported melena or vomiting. He reports a history of hemorrhoidal bleeding in the past, massive, but none recently. - CT abdomen without contrast does not show retroperitoneal hematoma. - Status post 3 units total PRBCs.  - iron studies borderline low, will start iron supplements.  - stable, will need PCP follow up in 1-2 weeks following discharge to monitor CBC and repeat iron studies in a few months.  Anasarca/acute right-sided CHF/severe TR/moderate MR - Secondary to decompensated right-sided heart failure from severe tricuspid regurgitation. Diuretics have not been started initially due to relative hypotension and soft blood pressures this morning in the context of acute renal failure. His hemodynamics seems to improve slightly on 09/14/13 and he was started on a Lasix gtt.  - Not felt to be a surgical candidate. Cardiology is reviewing options for second opinion regarding valve surgery.  - R sided pleural effusion, repeat CXR 1/3 with slight improvement. Continue diuresis. He is breathing well on room air and will hold off thoracentesis for now.  - diuretics per cardiology, to be transitioned to po and may be discharged home 1-2 days Persistent A. Fib  - Controlled ventricular rate.  - Currently on no rate control medications. Consider resuming metoprolol as blood pressure tolerates. - resume Coumadin Acute renal failure- Likely multifactorial from hypotension, diuresis and ARB. These medications are on hold. - Improving. No hydronephrosis seen on CT abdomen. Hypotension - Management as above. Improving. Multiple sclerosis - Neurology consultation appreciated. Signed off 12/30.  Code Status: Full Family Communication: none Disposition Plan: To be  determined.  Consultants:  Neurology  Cardiology  Procedures:  None  Antibiotics:  None   Subjective: - no complaints, denies chest pain.    Objective: Filed Vitals:   09/16/13 0617 09/16/13 1400 09/16/13 2211 09/17/13 0538  BP: 132/64 119/64 132/59 126/73  Pulse: 73 77 76 80  Temp: 98.3 F (36.8 C) 97 F (36.1 C) 98 F (36.7 C) 98.2 F (36.8 C)  TempSrc: Oral Oral Oral Oral  Resp: 20 20 20 20   Height:      Weight: 81.194 kg (179 lb)   80.2 kg (176 lb 12.9 oz)  SpO2: 100% 100% 100% 98%    Intake/Output Summary (Last 24 hours) at 09/17/13 0856 Last data filed at 09/17/13 0827  Gross per 24 hour  Intake   1320 ml  Output    701 ml  Net    619 ml   Filed Weights   09/15/13 0446 09/16/13 0617 09/17/13 0538  Weight: 80.105 kg (176 lb 9.6 oz) 81.194 kg (179 lb) 80.2 kg (176 lb 12.9 oz)   Exam: General exam: Moderately built and nourished male. Respiratory system: Slightly reduced breath sounds in the bases with occasional basal crackles. No increased work of breathing. Cardiovascular system: S1 & S2 heard, irregularly irregular. No JVD, gallops, clicks. trace pitting bilateral leg edema extending to thighs. 3/6 systolic murmur at apex. Gastrointestinal system: Abdomen is mildly distended, soft and nontender.  Central nervous system: Alert and oriented. No focal neurological deficits. Extremities: Symmetric 4 x 5 power upper extremity but reduced power in lower extremity.  Data Reviewed: Basic Metabolic Panel:  Recent Labs Lab 09/13/13 0515 09/14/13 0605 09/15/13 0745 09/16/13 0355 09/17/13 0400  NA 145 148* 150* 149* 149*  K 4.9 4.6 3.9 3.7 3.8  CL 110  115* 112 113* 111  CO2 21 20 22 24 26   GLUCOSE 93 96 104* 93 88  BUN 112* 90* 66* 53* 44*  CREATININE 3.46* 2.31* 1.78* 1.67* 1.61*  CALCIUM 8.4 8.4 8.8 8.9 8.8   Liver Function Tests:  Recent Labs Lab 09/11/13 1940 09/12/13 0900  AST 20 15  ALT 19 17  ALKPHOS 298* 306*  BILITOT 0.7 0.8   PROT 6.9 6.6  ALBUMIN 3.4* 3.2*   CBC:  Recent Labs Lab 09/13/13 1923 09/14/13 0605 09/15/13 0745 09/16/13 0355 09/17/13 0400  WBC 6.8 6.9 5.5 5.8 5.8  HGB 8.1* 7.9* 8.4* 8.4* 8.7*  HCT 24.9* 24.1* 25.5* 26.4* 27.2*  MCV 84.1 84.0 84.2 86.0 86.1  PLT 146* 151 155 156 163   BNP (last 3 results)  Recent Labs  09/11/13 1940 09/13/13 0515 09/16/13 0355  PROBNP 3366.0* 2647.0* 3923.0*   Studies: Dg Chest 2 View  09/16/2013   CLINICAL DATA:  CHF, pleural effusion  EXAM: CHEST  2 VIEW  COMPARISON:  Prior chest x-ray 09/11/2013; CT abdomen/ pelvis including lung bases 09/12/2013  FINDINGS: Persistent but decreased right pleural effusion with residual right basilar opacity. Stable cardiomegaly. Tortuous and atherosclerotic thoracic aorta. Mild pulmonary vascular congestion without overt edema. No pneumothorax.  IMPRESSION: 1. Decreasing right pleural effusion with persistent right basilar atelectasis. 2. Mild pulmonary vascular congestion without overt edema.   Electronically Signed   By: Jacqulynn Cadet M.D.   On: 09/16/2013 08:47   Scheduled Meds: . atorvastatin  10 mg Oral q1800  . dalfampridine  10 mg Oral BID  . Dimethyl Fumarate  1 capsule Oral BID  . ferrous sulfate  325 mg Oral TID WC  . folic acid  1 mg Oral Daily  . warfarin  2 mg Oral ONCE-1800  . Warfarin - Pharmacist Dosing Inpatient   Does not apply q1800   Continuous Infusions: . sodium chloride    . furosemide (LASIX) infusion 4 mg/hr (09/14/13 1002)   Principal Problem:   Anasarca Active Problems:   Aortic dissection- chronic abd dissection    MS (multiple sclerosis)   Persistent atrial fibrillation   Severe tricuspid valve regurgitation   Dissection of aorta, thoracic- surgery Feb 2000   Long term (current) use of anticoagulants   Bilateral leg edema   Acute right-sided congestive heart failure   Unstable gait   Anemia due to blood loss, acute in setting of supratheraputic INR 09/11/13   Aortic  arch dissection- chronic type A dissection   Acute renal insufficiency   Anemia   Sinus pause  Time spent: 25 minutes.  Johari Bennetts M. Cruzita Lederer, MD [facility] [phone]  If 7PM-7AM, please contact night-coverage [email or web address] Password [email or web address] 09/17/2013, 8:56 AM    LOS: 6 days

## 2013-09-17 NOTE — Progress Notes (Signed)
Pt. Ambulated in hallway with walker and NT to nurses station and back to his room without any complications. Pt. Had a Brady episode with HR of 36 while sleeping around 1430. I notified MD and MD came to unit to check on pt. And to check Telemetry strips. Pt. Was asymptomatic and showed no physical signs and symptoms of distress.

## 2013-09-18 ENCOUNTER — Ambulatory Visit: Payer: Medicare Other | Admitting: Thoracic Surgery (Cardiothoracic Vascular Surgery)

## 2013-09-18 ENCOUNTER — Other Ambulatory Visit: Payer: Self-pay | Admitting: Diagnostic Neuroimaging

## 2013-09-18 ENCOUNTER — Encounter (HOSPITAL_COMMUNITY): Payer: Self-pay | Admitting: Physician Assistant

## 2013-09-18 LAB — BASIC METABOLIC PANEL
BUN: 34 mg/dL — ABNORMAL HIGH (ref 6–23)
CO2: 26 mEq/L (ref 19–32)
Calcium: 8.9 mg/dL (ref 8.4–10.5)
Chloride: 111 mEq/L (ref 96–112)
Creatinine, Ser: 1.47 mg/dL — ABNORMAL HIGH (ref 0.50–1.35)
GFR calc Af Amer: 56 mL/min — ABNORMAL LOW (ref >90)
GFR calc non Af Amer: 48 mL/min — ABNORMAL LOW (ref >90)
Glucose, Bld: 84 mg/dL (ref 70–99)
Potassium: 3.9 mEq/L (ref 3.7–5.3)
Sodium: 147 mEq/L (ref 137–147)

## 2013-09-18 LAB — CBC
HCT: 27.6 % — ABNORMAL LOW (ref 39.0–52.0)
Hemoglobin: 8.7 g/dL — ABNORMAL LOW (ref 13.0–17.0)
MCH: 27.4 pg (ref 26.0–34.0)
MCHC: 31.5 g/dL (ref 30.0–36.0)
MCV: 87.1 fL (ref 78.0–100.0)
Platelets: 150 10*3/uL (ref 150–400)
RBC: 3.17 MIL/uL — ABNORMAL LOW (ref 4.22–5.81)
RDW: 16.9 % — ABNORMAL HIGH (ref 11.5–15.5)
WBC: 5.6 10*3/uL (ref 4.0–10.5)

## 2013-09-18 LAB — PROTIME-INR
INR: 2.11 — ABNORMAL HIGH (ref 0.00–1.49)
Prothrombin Time: 23 seconds — ABNORMAL HIGH (ref 11.6–15.2)

## 2013-09-18 MED ORDER — FERROUS SULFATE 325 (65 FE) MG PO TABS: 325.0000 mg | ORAL_TABLET | Freq: Three times a day (TID) | ORAL | Status: DC

## 2013-09-18 MED ORDER — FUROSEMIDE 40 MG PO TABS: 40.0000 mg | ORAL_TABLET | Freq: Two times a day (BID) | ORAL | Status: DC

## 2013-09-18 MED ORDER — WARFARIN SODIUM 1 MG PO TABS
1.0000 mg | ORAL_TABLET | Freq: Once | ORAL | Status: DC
  Filled 2013-09-18: qty 1

## 2013-09-18 NOTE — Progress Notes (Addendum)
Notified that patient had a 4.87 second pause. Patient was asleep and was asymptomatic.  Kathline Magic paged, wants cardiology notified. Will continue to monitor. Blood pressure 114/59, pulse 72, temperature 97.6 F (36.4 C), temperature source Oral, resp. rate 16, height 5\' 8"  (1.727 m), weight 80.2 kg (176 lb 12.9 oz), SpO2 96.00%. Tresa Endo

## 2013-09-18 NOTE — Evaluation (Signed)
Occupational Therapy Evaluation and Discharge Patient Details Name: [name] MRN: [id number] DOB: 11-03-1945 Today's Date: 09/18/2013 Time: 1779-3903 OT Time Calculation (min): 38 min  OT Assessment / Plan / Recommendation History of present illness [name] is an 68 y.o. male with a history of multiple sclerosis, hypertension, dyslipidemia, congestive heart failure, atrial fibrillation on anticoagulation, and mitral regurgitation was admitted with increasing edema involving lower extremities. Neurology consultation was obtained because of increasing problems with his gait. He's become increasingly unstable and has experienced multiple falls over the past several weeks. He has resorted to using a rolling walker over the past several days with no recurrent falls. Previously he had been using a cane   Clinical Impression   This 68 yo male admitted with above presents to acute OT with above. Will benefit from continued [place]. Acute OT will sign off.    OT Assessment  All further OT needs can be met in the next venue of care    Follow Up Recommendations  Home health OT       Equipment Recommendations   (TBD by [facility])          Precautions / Restrictions Precautions Precautions: Fall Restrictions Weight Bearing Restrictions: No       ADL  Eating/Feeding: Modified independent (increased time) Where Assessed - Eating/Feeding: Chair Grooming: Set up Where Assessed - Grooming: Supported sitting Upper Body Bathing: Set up Where Assessed - Upper Body Bathing: Supported sitting Lower Body Bathing: Min guard Where Assessed - Lower Body Bathing: Unsupported standing Upper Body Dressing: Set up Where Assessed - Upper Body Dressing: Supported sitting Lower Body Dressing: Min guard Where Assessed - Lower Body Dressing: Unsupported standing Toilet Transfer: Min Psychiatric nurse Method: Sit to Loss adjuster, chartered:  (recliner>down hall to nurses station and  back>recliner) Toileting - Water quality scientist and Hygiene: Min guard Where Assessed - Best boy and Hygiene: Sit to stand from 3-in-1 or toilet Equipment Used: Gait belt (rollator) Transfers/Ambulation Related to ADLs: min guard A with rollator ADL Comments: Crosses legs to get to feet    OT Diagnosis: Generalized weakness  OT Problem List: Decreased strength;Decreased safety awareness;Impaired UE functional use  Acute Rehab OT Goals Patient Stated Goal: to go home, back to work  Visit Information  Last OT Received On: 09/18/13 Assistance Needed: +1 History of Present Illness: [name] is an 68 y.o. male with a history of multiple sclerosis, hypertension, dyslipidemia, congestive heart failure, atrial fibrillation on anticoagulation, and mitral regurgitation was admitted with increasing edema involving lower extremities. Neurology consultation was obtained because of increasing problems with his gait. He's become increasingly unstable and has experienced multiple falls over the past several weeks. He has resorted to using a rolling walker over the past several days with no recurrent falls. Previously he had been using a cane       Prior [place] expects to be discharged to:: Private residence Living Arrangements: Spouse/significant other Available Help at Discharge: Family;Available PRN/intermittently Type of Home: House Home Access: Ramped entrance Home Layout: Two level Alternate Level Stairs-Number of Steps: stair lift Home Equipment: Kell - 4 wheels;Cane - single point;Other (comment);Bedside commode Prior Function Level of Independence: Independent with assistive device(s) Comments:  (SPC and now rollator) Communication Communication: No difficulties Dominant Hand: Right         Vision/Perception Vision - History Baseline Vision: Wears glasses all the time Patient Visual Report: No change from  baseline  Cognition  Cognition Arousal/Alertness: Awake/alert Behavior During Therapy: WFL for tasks assessed/performed Area of Impairment: Safety/judgement Safety/Judgement:  (wants to try [facility] again; however for now rollator would be better) General Comments: 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[employer]), had taken the fall semester off    Extremity/Trunk Assessment Upper Extremity Assessment Upper Extremity Assessment: Generalized weakness (overall slower movements of RUE compared to left)     Mobility Bed Mobility Details for Bed Mobility Assistance: Pt in recliner upon arrival Transfers Transfers: Sit to Stand;Stand to Sit Sit to Stand: 4: Min guard;With upper extremity assist;With armrests;From chair/3-in-1 Stand to Sit: 4: Min guard;With upper extremity assist;With armrests;To chair/3-in-1           End of Session OT - End of Session Equipment Utilized During Treatment: Gait belt (rollator) Activity Tolerance: Patient tolerated treatment well Patient left: in chair;with call bell/phone within reach       Almon Register [phone] 09/18/2013, 12:07 PM

## 2013-09-18 NOTE — Progress Notes (Signed)
Patient Name: [name] Date of Encounter: 09/18/2013     Principal Problem:   Anasarca Active Problems:   Aortic dissection- chronic abd dissection    MS (multiple sclerosis)   Persistent atrial fibrillation   Severe tricuspid valve regurgitation   Dissection of aorta, thoracic- surgery Feb 2000   Long term (current) use of anticoagulants   Bilateral leg edema   Acute right-sided congestive heart failure   Unstable gait   Anemia due to blood loss, acute in setting of supratheraputic INR 09/11/13   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   Acute renal insufficiency   Anemia   Sinus pause    SUBJECTIVE  He is feeling much better. Swelling and scrotal edema is much improved. He is not SOB and feels ready to go home today.   CURRENT MEDS . atorvastatin  10 mg Oral q1800  . dalfampridine  10 mg Oral BID  . Dimethyl Fumarate  1 capsule Oral BID  . ferrous sulfate  325 mg Oral TID WC  . folic acid  1 mg Oral Daily  . furosemide  40 mg Oral BID  . warfarin  1 mg Oral ONCE-1800  . Warfarin - Pharmacist Dosing Inpatient   Does not apply q1800    OBJECTIVE  Filed Vitals:   09/17/13 2042 09/18/13 0148 09/18/13 0607 09/18/13 1100  BP:  114/59 132/73 118/62  Pulse: 81 72 80 83  Temp: 98.2 F (36.8 C) 97.6 F (36.4 C) 98.1 F (36.7 C)   TempSrc: Oral Oral Oral   Resp: 18 16 18 16   Height:      Weight:   177 lb 9.6 oz (80.559 kg)   SpO2: 100% 96% 100% 100%    Intake/Output Summary (Last 24 hours) at 09/18/13 1225 Last data filed at 09/18/13 0906  Gross per 24 hour  Intake    840 ml  Output    300 ml  Net    540 ml   Filed Weights   09/16/13 0617 09/17/13 0538 09/18/13 0607  Weight: 179 lb (81.194 kg) 176 lb 12.9 oz (80.2 kg) 177 lb 9.6 oz (80.559 kg)    PHYSICAL EXAM  General: Pleasant, NAD. Neuro: Alert and oriented X 3. Moves all extremities spontaneously. Psych: Normal affect. HEENT:  Normal  Neck: Supple without bruits or + JVD to  just about clavicle Lungs:  Resp regular and unlabored, CTA.+ diminished breath sounds at bases Heart: irregularly irregular, +systolic murmur Abdomen: Soft, non-tender, +distended, BS + x 4.  Extremities: No clubbing, cyanosis or edema. DP/PT/Radials 1+ and equal bilaterally. Legs wrapped by wound care  Accessory Clinical Findings  CBC  Recent Labs  09/17/13 0400 09/18/13 0554  WBC 5.8 5.6  HGB 8.7* 8.7*  HCT 27.2* 27.6*  MCV 86.1 87.1  PLT 163 [id number]   Basic Metabolic Panel  Recent Labs  09/17/13 0400 09/18/13 0554  NA 149* 147  K 3.8 3.9  CL 111 111  CO2 26 26  GLUCOSE 88 84  BUN 44* 34*  CREATININE 1.61* 1.47*  CALCIUM 8.8 8.9   Liver Function Tests  TELE  Afib. HR 90's currently Loletha Grayer last night and this morning.   ECG  Non on this admission   Radiology/Studies  Ct Abdomen Pelvis Wo Contrast  09/12/2013   CLINICAL DATA:  Decreased hemoglobin. Recent fall. Evaluate for retroperitoneal hemorrhage.  EXAM: CT ABDOMEN AND PELVIS WITHOUT CONTRAST  TECHNIQUE: Multidetector CT imaging of the abdomen and pelvis was performed following  the standard protocol without intravenous contrast.  COMPARISON:  01/06/2013 and 04/01/12  FINDINGS: No evidence of retroperitoneal hemorrhage. Increased size of large right pleural effusion is seen. There is also increased mild low-attenuation ascites and diffuse body wall edema.  Low-attenuation lesion in the anterior left hepatic lobe measuring 4.3 cm remains stable, and is likely benign. No other liver lesions identified in this noncontrast study. Cholelithiasis again demonstrated. No evidence of acute cholecystitis. Bilateral renal cysts and tiny nonobstructive right renal calculi are stable. No evidence hydronephrosis. Adrenal glands, pancreas, and spleen have a normal appearance on this noncontrast study. No evidence of abdominal or pelvic lymphadenopathy. No evidence of focal inflammatory process. Small bilateral inguinal hernias seen  both containing only fat.  IMPRESSION: No evidence of retroperitoneal hemorrhage.  Increased size of large right pleural effusion, mild ascites, and diffuse body wall edema.  Stable left hepatic lobe lesion, likely benign. Stable cholelithiasis and nonobstructive right nephrolithiasis.     Dg Chest 2 View  09/16/2013   CLINICAL DATA:  CHF, pleural effusion  EXAM: CHEST  2 VIEW  COMPARISON:  Prior chest x-ray 09/11/2013; CT abdomen/ pelvis including lung bases 09/12/2013  FINDINGS: Persistent but decreased right pleural effusion with residual right basilar opacity. Stable cardiomegaly. Tortuous and atherosclerotic thoracic aorta. Mild pulmonary vascular congestion without overt edema. No pneumothorax.  IMPRESSION: 1. Decreasing right pleural effusion with persistent right basilar atelectasis. 2. Mild pulmonary vascular congestion without overt edema.    Dg Chest 2 View  09/12/2013   CLINICAL DATA:  History of hypertension and. Nonsmoker. Acute right-sided congestive heart failure.  EXAM: CHEST  2 VIEW  COMPARISON:  03/03/2013  FINDINGS: Stable appearance of mediastinal postoperative changes. Shallow inspiration with elevation of the right hemidiaphragm. Mild cardiac enlargement. No pulmonary vascular congestion or edema. Small bilateral pleural effusions. Basilar atelectasis. No focal consolidation. No pneumothorax. Calcified and tortuous aorta.  IMPRESSION: Cardiac enlargement without significant vascular congestion or edema. Small bilateral pleural effusions and basilar atelectasis.     ASSESSMENT AND PLAN ABDULKAREEM Henry is a 68 y.o.male with a history of multiple sclerosis, hypertension, diastolic heart failure, anemia,  type A aortic dissection s/p repair and monitored by [name], atrial fibrillation on Coumadin who was admitted 09/11/2013 with increased leg swelling and scrotal swelling and found to have hg of 5.9 and in acute renal failure  Acute on chronic diastolic CHF/severe TR/moderate MR:  BNP- ~4K -- CT abdomen with right pleural effusion, mild ascites, and diffuse body wall edema--- pleural effusion improving. Evaluated by pulmonology and felt he did not need thoracentesis. -- Net output -438.5, Wt -13lbs. I/Os do not correlate clinically -- Not a candidate for valve surgery, may be a good candidate for follow-up with [name] in the advanced CHF clinic -- Converted to PO Lasix yesterday- on 40 mg BID. Feeling much better. May be able to go home today.  Acute on chronic anemia- 5.9 on admission. S/p 3 units PRBCs 8.3 today --Continue coumadin with careful monitoring -- Unclear etiology. No overt blood loss. -- FOBT positive - may be due to hemorrhoids, he denies melena or BRBPR. Reports he is due for colonoscopy with [facility]. Would recommend he have that sooner, than later because on coumadin . -- Cont Iron supplementation   AKI- improved with gentle hydration and careful diuresis. CR 4.26 on admission, 1.47 today (near baseline) -- Continue to monitor  Hypotension- patient is on multiple home BP meds including Diovan to 20 mg daily, Cardura 4 mg at  bedtime, amlodipine 5 mg daily, Lopressor 100 mg twice a day. All of these have been held soncdary to hypotension. He is now normotensive. Will need to add back some of his meds.  Bradycardia- episodes of slow afib overnight and this morning, 30-40s. Had >5second pauses 09/16/12 at night while asleep. No EP consult for now-- will continue to monitor. -- Now 90-110s,   HLD- continue statin   Tyrell Antonio PA-C  Pager [phone] The patient was seen.  Agree with above assessment and plan.  His heart rate during the day has been satisfactory.  While asleep he does have significant pauses which are asymptomatic.  No indication for pacemaker at this point.  I would not restart metoprolol when he goes home. 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 He no longer has the option  to work half time.  I have suggested to him that he consider retiring rather than returning to the stresses of full-time employment.  He will also discuss this with his primary cardiologist Dr. [name].

## 2013-09-18 NOTE — Discharge Summary (Signed)
Physician Discharge Summary  [name] [id number] DOB: 07/07/1946 DOA: 09/11/2013  PCP: Criselda Peaches, MD  Admit date: 09/11/2013 Discharge date: 09/18/2013  Time spent: 35 minutes  Recommendations for Outpatient Follow-up:  1. Follow up with PCP in 1 week  2. Follow up with Cardiology 10/02/2013 3. Follow up with Neurology 10/23/2013  4. Follow up with GI for a colonoscopy  Recommendations for primary care physician for things to follow:  - repeat CBC, BMP  Discharge Diagnoses:  Principal Problem:   Anasarca Active Problems:   Aortic dissection- chronic abd dissection    MS (multiple sclerosis)   Persistent atrial fibrillation   Severe tricuspid valve regurgitation   Dissection of aorta, thoracic- surgery Feb 2000   Long term (current) use of anticoagulants   Bilateral leg edema   Acute right-sided congestive heart failure   Unstable gait   Anemia due to blood loss, acute in setting of supratheraputic INR 09/11/13   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   Acute renal insufficiency   Anemia   Sinus pause  Discharge Condition: stable  Diet recommendation: heart healthy, low salt  Filed Weights   09/16/13 0617 09/17/13 0538 09/18/13 0607  Weight: 81.194 kg (179 lb) 80.2 kg (176 lb 12.9 oz) 80.559 kg (177 lb 9.6 oz)   History of present illness:  68 year old male who has a past medical history of Neuromuscular disorder; Hypertension; Anemia; Depression; Multiple sclerosis; Thrombocytopenia; Dyslipidemia; Aortic dissection, thoracic; BPH (benign prostatic hyperplasia); Hemorrhoids; Aortic dissection (11/05/1998); Aneurysm of aortic arch (04/04/2012); Chest pain (11/18/2009); CHF (congestive heart failure) (Jan 02, 1946); Bilateral lower extremity edema; Atrial fibrillation (01/30/2013); Pleural effusion, right, large (01/30/2013); Severe tricuspid valve regurgitation (01/30/2013); Mitral regurgitation (02/20/2013); and Lower extremity edema (02/20/2013). Was sent  to the hospital on the PCP office, patient has been closely followed by PCP for fluid retention and leg edema and received Lasix without much improvement so he was sent to the hospital for IV Lasix. Patient has history of diastolic heart failure, atrial fibrillation and he is on anticoagulation with Coumadin. He denies shortness of breath or chest pain, not requiring oxygen at this time his O2 sats 100% on room air. Patient does have lower extremity swelling evident in both lower extremities. Patient also has history of multiple sclerosis and is followed by neurology.  He denies recent fever no nausea vomiting, admits to having some loose stool secondary to MS medications. Patient has gained 5 pounds over past few days.  Hospital Course:  Anasarca/acute right-sided CHF/severe TR/moderate MR - Secondary to decompensated right-sided heart failure from severe tricuspid regurgitation. Diuretics have not been started initially due to relative hypotension and soft blood pressures in the context of acute renal failure. His hemodynamics seems to improve slightly on 09/14/13 and he was started on a Lasix gtt. His Lasix gtt was discontinued on 1/3 and was transitioned to oral Lasix as 40 mg BID. His weight on presentation was 190 lbs and on discharge 177 lbs. He was counseled for daily weights, low salt intake. As far as his valvular disease is concerned, he was not felt to be a surgical candidate. Cardiology is reviewing options for second opinion regarding valve surgery. He also has known R sided pleural effusion, which seems to respond to diuresis and a repeat CXR 1/3 showed improvement. He did not undergo thoracentesis given improvement and he is breathing comfortable on room air.  Acute on chronic anemia  Unclear etiology, perhaps related to iron deficiency given low iron studies.  No overt blood loss but does have known hemorrhoids. No reported melena or vomiting. He reports a history of massive hemorrhoidal bleeding  in the past, but none recently. CT abdomen without contrast does not show retroperitoneal hematoma. Status post 3 units total PRBCs. Will recommend that he has repeat CBC at his next PCP appointment. Patient states that he is supposed to get a colonoscopy as an outpatient with [facility].  Persistent A. Fib - Controlled ventricular rate. Few asymptomatic episodes of bradycardia and pauses while hospitalized, cardiology aware and recommended monitoring without indication for pacemaker. Resume Coumadin. Hold Metoprolol on discharge.  Acute renal failure- Likely multifactorial from hypotension, diuresis and ARB. His ARB has been discontinued on discharge.  Hypotension - will discontinue Norvasc, Diovan, Doxazosin and Metoprolol. His hemodynamic parameters will need further monitoring as an outpatient and further titration as clinically indicated.  Multiple sclerosis - Neurology consultation appreciated. Signed off 12/30. Continue previous medications.   Procedures:  2D echo Study Conclusions - Left ventricle: Systolic function was normal. The estimated ejection fraction was in the range of 55% to 60%. Wall motion was normal; there were no regional wall motion abnormalities. - Aortic valve: No evidence of vegetation. Mild regurgitation. - Mitral valve: No evidence of vegetation. Mild to moderate regurgitation. - Left atrium: No evidence of thrombus in the atrial cavity or appendage. - Right ventricle: The cavity size was severely dilated. - Right atrium: The atrium was severely dilated. - Tricuspid valve: Severely dilated annulus. Normal thickness, noncalcified leaflets. There was malcoaptation of the valve leaflets. No evidence of vegetation. Severe regurgitation originating from the central coaptation point and directed toward the septum. - Pulmonic valve: No evidence of vegetation. - Pulmonary arteries: Systolic pressure was mildly increased. PA peak pressure: 107mm Hg (S).  Consultations:  Cardiology    Neurology   Discharge Exam: Filed Vitals:   09/18/13 0148 09/18/13 0607 09/18/13 1100 09/18/13 1400  BP: 114/59 132/73 118/62 114/59  Pulse: 72 80 83 82  Temp: 97.6 F (36.4 C) 98.1 F (36.7 C)  97.8 F (36.6 C)  TempSrc: Oral Oral  Oral  Resp: 16 18 16 17   Height:      Weight:  80.559 kg (177 lb 9.6 oz)    SpO2: 96% 100% 100% 99%   General: NAD Cardiovascular: RRR Respiratory: CTA biL  Discharge Instructions   Future Appointments Provider Department Dept Phone   10/02/2013 10:00 AM [facility] [id number]   10/23/2013 11:30 AM Philmore Pali, NP [facility] [phone]       Medication List    STOP taking these medications       amLODipine 5 MG tablet  Commonly known as:  NORVASC     DIOVAN 320 MG tablet  Generic drug:  valsartan     doxazosin 4 MG tablet  Commonly known as:  CARDURA     metoprolol 100 MG tablet  Commonly known as:  LOPRESSOR      TAKE these medications       dalfampridine 10 MG Tb12  Commonly known as:  AMPYRA  Take 1 tablet (10 mg total) by mouth 2 (two) times daily.     ferrous sulfate 325 (65 FE) MG tablet  Take 1 tablet (325 mg total) by mouth 3 (three) times daily with meals.     folic acid 1 MG tablet  Commonly known as:  FOLVITE  Take 1 mg by mouth daily.     furosemide 40 MG tablet  Commonly known as:  LASIX  Take 1 tablet (40 mg total) by mouth 2 (two) times daily.     potassium chloride 10 MEQ tablet  Commonly known as:  K-DUR  Take 10 mEq by mouth daily. 1 tablet every time he takes lasix     QUEtiapine 25 MG tablet  Commonly known as:  SEROQUEL  Take 25 mg by mouth at bedtime as needed.     rosuvastatin 5 MG tablet  Commonly known as:  CRESTOR  Take 5 mg by mouth daily.     STOOL SOFTENER PO  1 tablet as needed.     TECFIDERA 120 & 240 MG Misc  Generic drug:  Dimethyl Fumarate  Take 1 capsule by mouth as directed.     warfarin 2 MG tablet  Commonly known  as:  COUMADIN  Take 2 mg by mouth See admin instructions. Take 1 tablet by mouth daily or as directed. Take 1mg  half of the 2mg  on Mondays.           Follow-up Information   Follow up with GREEN, EDWIN JAY, MD. Schedule an appointment as soon as possible for a visit in 1 week.   Specialty:  Internal Medicine   Contact information:   [address] [place] [address] [phone]       Follow up with [name], MD. Schedule an appointment as soon as possible for a visit on 10/02/2013.   Specialty:  Cardiology   Contact information:   [address] [place] [address] [phone]      The results of significant diagnostics from this hospitalization (including imaging, microbiology, ancillary and laboratory) are listed below for reference.    Significant Diagnostic Studies: Ct Abdomen Pelvis Wo Contrast  09/12/2013   CLINICAL DATA:  Decreased hemoglobin. Recent fall. Evaluate for retroperitoneal hemorrhage.  EXAM: CT ABDOMEN AND PELVIS WITHOUT CONTRAST  TECHNIQUE: Multidetector CT imaging of the abdomen and pelvis was performed following the standard protocol without intravenous contrast.  COMPARISON:  01/06/2013 and 04/01/12  FINDINGS: No evidence of retroperitoneal hemorrhage. Increased size of large right pleural effusion is seen. There is also increased mild low-attenuation ascites and diffuse body wall edema.  Low-attenuation lesion in the anterior left hepatic lobe measuring 4.3 cm remains stable, and is likely benign. No other liver lesions identified in this noncontrast study. Cholelithiasis again demonstrated. No evidence of acute cholecystitis. Bilateral renal cysts and tiny nonobstructive right renal calculi are stable. No evidence hydronephrosis. Adrenal glands, pancreas, and spleen have a normal appearance on this noncontrast study. No evidence of abdominal or pelvic lymphadenopathy. No evidence of focal inflammatory process. Small  bilateral inguinal hernias seen both containing only fat.  IMPRESSION: No evidence of retroperitoneal hemorrhage.  Increased size of large right pleural effusion, mild ascites, and diffuse body wall edema.  Stable left hepatic lobe lesion, likely benign. Stable cholelithiasis and nonobstructive right nephrolithiasis.   Electronically Signed   By: Earle Gell M.D.   On: 09/12/2013 10:19   Dg Chest 2 View  09/16/2013   CLINICAL DATA:  CHF, pleural effusion  EXAM: CHEST  2 VIEW  COMPARISON:  Prior chest x-ray 09/11/2013; CT abdomen/ pelvis including lung bases 09/12/2013  FINDINGS: Persistent but decreased right pleural effusion with residual right basilar opacity. Stable cardiomegaly. Tortuous and atherosclerotic thoracic aorta. Mild pulmonary vascular congestion without overt edema. No pneumothorax.  IMPRESSION: 1. Decreasing right pleural effusion with persistent right basilar atelectasis. 2. Mild pulmonary vascular congestion without  overt edema.   Electronically Signed   By: Jacqulynn Cadet M.D.   On: 09/16/2013 08:47   Dg Chest 2 View  09/12/2013   CLINICAL DATA:  History of hypertension and. Nonsmoker. Acute right-sided congestive heart failure.  EXAM: CHEST  2 VIEW  COMPARISON:  03/03/2013  FINDINGS: Stable appearance of mediastinal postoperative changes. Shallow inspiration with elevation of the right hemidiaphragm. Mild cardiac enlargement. No pulmonary vascular congestion or edema. Small bilateral pleural effusions. Basilar atelectasis. No focal consolidation. No pneumothorax. Calcified and tortuous aorta.  IMPRESSION: Cardiac enlargement without significant vascular congestion or edema. Small bilateral pleural effusions and basilar atelectasis.   Electronically Signed   By: Lucienne Capers M.D.   On: 09/12/2013 06:23   Dg Knee Right Port  09/14/2013   CLINICAL DATA:  Knee pain after fall.  EXAM: PORTABLE RIGHT KNEE - 1-2 VIEW  COMPARISON:  None.  FINDINGS: Two views of the right knee were  obtained. The lateral view is obliqued and limited. However, there is no evidence for fracture or dislocation. No evidence for a large joint effusion.  IMPRESSION: Limited study without gross abnormality.   Electronically Signed   By: Markus Daft M.D.   On: 09/14/2013 16:42   Labs: Basic Metabolic Panel:  Recent Labs Lab 09/14/13 0605 09/15/13 0745 09/16/13 0355 09/17/13 0400 09/18/13 0554  NA 148* 150* 149* 149* 147  K 4.6 3.9 3.7 3.8 3.9  CL 115* 112 113* 111 111  CO2 20 22 24 26 26   GLUCOSE 96 104* 93 88 84  BUN 90* 66* 53* 44* 34*  CREATININE 2.31* 1.78* 1.67* 1.61* 1.47*  CALCIUM 8.4 8.8 8.9 8.8 8.9   Liver Function Tests:  Recent Labs Lab 09/11/13 1940 09/12/13 0900  AST 20 15  ALT 19 17  ALKPHOS 298* 306*  BILITOT 0.7 0.8  PROT 6.9 6.6  ALBUMIN 3.4* 3.2*   CBC:  Recent Labs Lab 09/14/13 0605 09/15/13 0745 09/16/13 0355 09/17/13 0400 09/18/13 0554  WBC 6.9 5.5 5.8 5.8 5.6  HGB 7.9* 8.4* 8.4* 8.7* 8.7*  HCT 24.1* 25.5* 26.4* 27.2* 27.6*  MCV 84.0 84.2 86.0 86.1 87.1  PLT 151 155 156 163 150   BNP: BNP (last 3 results)  Recent Labs  09/11/13 1940 09/13/13 0515 09/16/13 0355  PROBNP 3366.0* 2647.0* 3923.0*   CBG:  Recent Labs Lab 09/15/13 2022  GLUCAP 99   Signed:  Sadeen Wiegel  [facility] 09/18/2013, 5:49 PM

## 2013-09-18 NOTE — Progress Notes (Signed)
Noted plans for d/c home today. Recommend home health therapy or outpt at d/c. [phone]

## 2013-09-18 NOTE — Progress Notes (Signed)
UR completed Dorri Ozturk K. Leva Baine, RN, BSN, [place], CCM  09/18/2013 2:19 PM

## 2013-09-18 NOTE — Progress Notes (Signed)
Patient had an episode of urinary incontinence. Bilateral ace wraps and kerlex changed to BLE d/t them becoming soiled with urine.  [name]

## 2013-09-18 NOTE — Progress Notes (Signed)
Physical Therapy Treatment Patient Details Name: [name] MRN: [id number] DOB: Jun 03, 1946 Today's Date: 09/18/2013 Time: [id number] PT Time Calculation (min): 14 min  PT Assessment / Plan / Recommendation  History of Present Illness [name] is an 68 y.o. male with a history of multiple sclerosis, hypertension, dyslipidemia, congestive heart failure, atrial fibrillation on anticoagulation, and mitral regurgitation was admitted with increasing edema involving lower extremities. Neurology consultation was obtained because of increasing problems with his gait. He's become increasingly unstable and has experienced multiple falls over the past several weeks. He has resorted to using a rolling walker over the past several days with no recurrent falls. Previously he had been using a cane   PT Comments   Paged to attempt ambulation with cane however patient unsteady and required additional HHA. Continue to recommend use of 4WW.   Follow Up Recommendations  Supervision/Assistance - 24 hour;Home health PT     Does the patient have the potential to tolerate intense rehabilitation     Barriers to Discharge        Equipment Recommendations       Recommendations for Other Services    Frequency Min 3X/week   Progress towards PT Goals Progress towards PT goals: Progressing toward goals  Plan Current plan remains appropriate    Precautions / Restrictions Precautions Precautions: Fall Restrictions Weight Bearing Restrictions: No   Pertinent Vitals/Pain no apparent distress    Mobility  Bed Mobility Bed Mobility: Not assessed Details for Bed Mobility Assistance: Pt in recliner upon arrival Transfers Sit to Stand: 4: Min guard;With upper extremity assist;With armrests;From chair/3-in-1 Stand to Sit: 4: Min guard;With upper extremity assist;With armrests;To chair/3-in-1 Ambulation/Gait Ambulation/Gait Assistance: 4: Min guard Ambulation Distance (Feet): 60 Feet Assistive  device: 4-wheeled walker Ambulation/Gait Assistance Details: Attempted gait with cane however patient too unsteady and required additional HHA Gait Pattern: Decreased stride length;Left genu recurvatum;Shuffle    Exercises     PT Diagnosis:    PT Problem List:   PT Treatment Interventions:     PT Goals (current goals can now be found in the care plan section) Acute Rehab PT Goals Patient Stated Goal: to go home, back to work  Visit Information  Last PT Received On: 09/18/13 Assistance Needed: +1 History of Present Illness: [name] is an 68 y.o. male with a history of multiple sclerosis, hypertension, dyslipidemia, congestive heart failure, atrial fibrillation on anticoagulation, and mitral regurgitation was admitted with increasing edema involving lower extremities. Neurology consultation was obtained because of increasing problems with his gait. He's become increasingly unstable and has experienced multiple falls over the past several weeks. He has resorted to using a rolling walker over the past several days with no recurrent falls. 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

## 2013-09-18 NOTE — Progress Notes (Signed)
Dr Claiborne Billings Cardiologist notified, no new orders at this time. Will continue to monitor. [name]

## 2013-09-18 NOTE — Progress Notes (Signed)
ANTICOAGULATION CONSULT NOTE - Follow Up Consult  Pharmacy Consult for warfarin Indication: atrial fibrillation  No Known Allergies  Patient Measurements: Height: 5\' 8"  (172.7 cm) Weight: 177 lb 9.6 oz (80.559 kg) (bed scale) IBW/kg (Calculated) : 68.4   Vital Signs: Temp: 98.1 F (36.7 C) (01/05 0607) Temp src: Oral (01/05 0607) BP: 132/73 mmHg (01/05 0607) Pulse Rate: 80 (01/05 0607)  Labs:  Recent Labs  09/16/13 0355 09/17/13 0400 09/18/13 0554  HGB 8.4* 8.7* 8.7*  HCT 26.4* 27.2* 27.6*  PLT 156 163 150  LABPROT 20.5* 21.0* 23.0*  INR 1.82* 1.87* 2.11*  CREATININE 1.67* 1.61* 1.47*    Estimated Creatinine Clearance: 47.8 ml/min (by C-G formula based on Cr of 1.47).   Medications:  Scheduled:  . atorvastatin  10 mg Oral q1800  . dalfampridine  10 mg Oral BID  . Dimethyl Fumarate  1 capsule Oral BID  . ferrous sulfate  325 mg Oral TID WC  . folic acid  1 mg Oral Daily  . furosemide  40 mg Oral BID  . Warfarin - Pharmacist Dosing Inpatient   Does not apply q1800    Assessment: 24 YOM with MS who was admitted with a SUPRAtherapeutic INR of 5.19. INR down to 2.02 on 12/31 after 2 doses of Vitamin K. Warfarin was resumed 12/31. INR now 2.11  Home dose: 2 mg daily except 1 mg on Monday  Goal of Therapy:  INR 2-3 Monitor platelets by anticoagulation protocol: Yes   Plan:   Coumadin 1mg  PO x1 Daily INR

## 2013-09-25 ENCOUNTER — Encounter: Payer: Self-pay | Admitting: Nurse Practitioner

## 2013-10-02 ENCOUNTER — Ambulatory Visit: Payer: Medicare Other | Admitting: Physician Assistant

## 2013-10-05 ENCOUNTER — Encounter: Payer: Self-pay | Admitting: Physician Assistant

## 2013-10-05 ENCOUNTER — Other Ambulatory Visit: Payer: Self-pay | Admitting: *Deleted

## 2013-10-05 ENCOUNTER — Ambulatory Visit (INDEPENDENT_AMBULATORY_CARE_PROVIDER_SITE_OTHER): Payer: Medicare Other | Admitting: Physician Assistant

## 2013-10-05 VITALS — BP 142/90 | HR 86 | Ht 68.0 in | Wt 168.0 lb

## 2013-10-05 DIAGNOSIS — E8779 Other fluid overload: Secondary | ICD-10-CM

## 2013-10-05 DIAGNOSIS — Z7901 Long term (current) use of anticoagulants: Secondary | ICD-10-CM

## 2013-10-05 DIAGNOSIS — I4819 Other persistent atrial fibrillation: Secondary | ICD-10-CM

## 2013-10-05 DIAGNOSIS — I079 Rheumatic tricuspid valve disease, unspecified: Secondary | ICD-10-CM

## 2013-10-05 DIAGNOSIS — I071 Rheumatic tricuspid insufficiency: Secondary | ICD-10-CM

## 2013-10-05 DIAGNOSIS — I5041 Acute combined systolic (congestive) and diastolic (congestive) heart failure: Secondary | ICD-10-CM

## 2013-10-05 DIAGNOSIS — E8770 Fluid overload, unspecified: Secondary | ICD-10-CM

## 2013-10-05 DIAGNOSIS — I4891 Unspecified atrial fibrillation: Secondary | ICD-10-CM

## 2013-10-05 NOTE — Patient Instructions (Signed)
1.  Reduce lasix to 40mg  daily.  If weight increases by 1 pound in 24 hours, or 5 pounds in 7 days, increase Lasix to 40 mg twice daily until the weight comes back now to baseline.  2.  continue daily weights and decreased sodium intake.  3. Will refer to advanced heart failure clinic.  4.  followup with Dr. [name] in 3 months.

## 2013-10-05 NOTE — Assessment & Plan Note (Signed)
Maintaining A. fib at a rate of 86 beats per minute

## 2013-10-05 NOTE — Progress Notes (Signed)
Date:  10/05/2013   ID:  [name], DOB 07/07/1946, MRN [id number]  PCP:  Timothy Peaches, MD  Primary Cardiologist:  [name]     History of Present Illness: [name] is a 68 y.o. male This is a 68 y.o. male patient of Dr. Gwenlyn Henry with a history of hemispheric repair of type A aortic dissection with resuspension of the native aortic valve in February 2000 Dr. Ricard Henry, chronic aortic dissection extends from the visualized thoracic aorta into the right common iliac artery descending, multiple sclerosis, dyslipidemia, hypertension, and anemia. Patient's last 2-D echocardiogram was 01/17/2013 revealed an ejection fraction of 50-55%.. Wall motion was normal. Aortic valve surgery with a mild central regurgitation. Aortic root was dilated at the ST junction. Mild to moderate mitral valve regurgitation. Left atrium was severely dilated 35.5 cm. The right ventricle cavity size was mildly dilated. Right atrium was severely dilated at 32.5 cm. The tricuspid valve showed severe regurgitation with reversal of flow in the hepatic veins. Dilated tricuspid annulus measuring 6.16 cm. The pulmonary pressure was 44 mmHg. Patient's last nuclear stress test was in 2011 and showed no significant ischemia was considered low risk. CT of the abdomen and pelvis 4/29.14 showed chronic aortic dissection that extends into the right common iliac artery. Small amount of ascites. Large right pleural effusion. Diffuse subcutaneous edema. These findings may be on the basis of elevated right heart filling pressures supported by cardiomegaly. He was recently seen by Dr. Gwenlyn Henry for dyspnea on exertion which recently has gotten worse.  Chronic Afib and on Coumadin anticoagulation for stroke prophylaxis. He saw Dr. Roxy Henry for consideration of tricuspid valve annuloplasty but thought to be too high risk for surgical intervention.  He does have multiple sclerosis and is minimally ambulatory and spent most of his day in the chair  although he does swim every other day.    He was admitted in December with anasarca.  He was diuresed and discharged on 1/5 and his weight has continued to decrease.   By his wife's records he is now 160 pounds.  He reports some softening of his lower extremity edema.  Some SOB.  The patient currently denies nausea, vomiting, fever, chest pain, orthopnea, dizziness, PND, cough, congestion, abdominal pain, hematochezia, melena.  Wt Readings from Last 3 Encounters:  10/05/13 168 lb (76.204 kg)  09/18/13 177 lb 9.6 oz (80.559 kg)  07/19/13 182 lb 8 oz (82.781 kg)     Past Medical History  Diagnosis Date  . Neuromuscular disorder   . Hypertension   . Anemia   . Depression   . Multiple sclerosis   . Thrombocytopenia   . Dyslipidemia   . Aortic dissection, thoracic   . BPH (benign prostatic hyperplasia)   . Hemorrhoids   . Aortic dissection 11/05/1998    S/P emergency repair of acute type A aortic dissection with resuspension of native aortic valve  . 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 CHF (congestive heart failure) 07-May-1946    2D Echo - EF 50-55%, mild-moderately dilated right ventricle, mild-moderate tricuspid valve regurgitation, moderately dilated right atrium  . Bilateral lower extremity edema   . Atrial fibrillation 01/30/2013    Atrial fibrillation   . Pleural effusion, right, large 01/30/2013  . Severe tricuspid valve regurgitation 01/30/2013    Severe tricuspid regurg by recent 2-D echo with a dilated tricuspid  annulus, moderate pulmonary hypertension and biatrial enlargement   . Mitral regurgitation 02/20/2013  . Exertional shortness of breath     "for awhile now" (09/11/2013)  . History of blood transfusion     Current Outpatient Prescriptions  Medication Sig Dispense Refill  . AMPYRA 10 MG TB12 TAKE 1 TABLET TWICE A DAY  180 tablet  1  .  ferrous sulfate 325 (65 FE) MG tablet Take 1 tablet (325 mg total) by mouth 3 (three) times daily with meals.  90 tablet  1  . folic acid (FOLVITE) 1 MG tablet Take 1 mg by mouth daily.        . furosemide (LASIX) 40 MG tablet Take 1 tablet (40 mg total) by mouth 2 (two) times daily.  60 tablet  1  . potassium chloride (K-DUR) 10 MEQ tablet Take 10 mEq by mouth daily. 1 tablet every time he takes lasix      . QUEtiapine (SEROQUEL) 25 MG tablet Take 25 mg by mouth at bedtime as needed.       . rosuvastatin (CRESTOR) 5 MG tablet Take 5 mg by mouth daily.        . TECFIDERA 120 & 240 MG MISC Take 1 capsule by mouth as directed.      . warfarin (COUMADIN) 2 MG tablet Take 2 mg by mouth See admin instructions. Take 1 tablet by mouth daily or as directed. Take 1mg  half of the 2mg  on Mondays.      [name] Calcium (STOOL SOFTENER PO) 1 tablet as needed.        No current facility-administered medications for this visit.    Allergies:   No Known Allergies  Social History:  The patient  reports that he has never smoked. He has never used smokeless tobacco. He reports that he drinks alcohol. He reports that he does not use illicit drugs.   Family history:   Family History  Problem Relation Age of Onset  . Thyroid cancer Father     smoked  . Lung cancer Brother     smoked  . Heart attack Mother   . Emphysema Brother     smoked    ROS:  Please see the history of present illness.  All other systems reviewed and negative.   PHYSICAL EXAM: VS:  BP 142/90  Pulse 86  Ht 5\' 8"  (1.727 m)  Wt 168 lb (76.204 kg)  BMI 25.55 kg/m2 Well nourished, well developed, in no acute distress HEENT: Pupils are equal round react to light accommodation extraocular movements are intact.  Neck: + JVDNo cervical lymphadenopathy. Cardiac: irregular rate and rhythm with 1/6 sys murmur. Lungs:  clear to auscultation bilaterally, no wheezing, rhonchi or rales Abd: soft, nontender, positive bowel sounds all  quadrants,   Liver appears enlarged. Ext: 2+ lower extremity edema.  2+ radial and dorsalis pedis pulses. Skin: warm and dry Neuro:  Grossly normal  EKG:  Afib 86 Beats per minute   ASSESSMENT AND PLAN:  Problem List Items Addressed This Visit   Persistent atrial fibrillation - Primary (Chronic)     Maintaining A. fib at a rate of 86 beats per minute    Severe tricuspid valve regurgitation (Chronic)   Long term (current) use of anticoagulants (Chronic)   Volume overload     Based on weight the patient's volume status is improved his weight has continued to decrease.   Neck veins are distended but does not necessarily represent volume status due to severe TR.  I'm going to decrease his Lasix to 40 mg daily. He will continue to weigh himself if he increases 1 pound in 24 hours or 5 pounds in a week, he'll increase his Lasix to twice daily until weight returns to base line.  Clearly a large component of his lower extremity edema this is severe tricuspid regurgitation, MR, and compounded with atrial fibrillation.  Will refer to Dr. Sung Amabile at [facility].

## 2013-10-05 NOTE — Assessment & Plan Note (Addendum)
Based on weight the patient's volume status is improved his weight has continued to decrease.   Neck veins are distended but does not necessarily represent volume status due to severe TR.   I'm going to decrease his Lasix to 40 mg daily. He will continue to weigh himself if he increases 1 pound in 24 hours or 5 pounds in a week, he'll increase his Lasix to twice daily until weight returns to base line.  Clearly a large component of his lower extremity edema this is severe tricuspid regurgitation, MR, and compounded with atrial fibrillation.  Will refer to Dr. Sung Amabile at [facility].

## 2013-10-09 ENCOUNTER — Ambulatory Visit (INDEPENDENT_AMBULATORY_CARE_PROVIDER_SITE_OTHER): Payer: Medicare Other | Admitting: Thoracic Surgery (Cardiothoracic Vascular Surgery)

## 2013-10-09 ENCOUNTER — Encounter: Payer: Self-pay | Admitting: Thoracic Surgery (Cardiothoracic Vascular Surgery)

## 2013-10-09 VITALS — BP 146/88 | HR 96 | Resp 20 | Ht 68.0 in | Wt 168.0 lb

## 2013-10-09 DIAGNOSIS — I071 Rheumatic tricuspid insufficiency: Secondary | ICD-10-CM

## 2013-10-09 DIAGNOSIS — I5032 Chronic diastolic (congestive) heart failure: Secondary | ICD-10-CM

## 2013-10-09 DIAGNOSIS — I71011 Dissection of aortic arch: Secondary | ICD-10-CM

## 2013-10-09 DIAGNOSIS — I059 Rheumatic mitral valve disease, unspecified: Secondary | ICD-10-CM

## 2013-10-09 DIAGNOSIS — I71019 Dissection of thoracic aorta, unspecified: Secondary | ICD-10-CM

## 2013-10-09 DIAGNOSIS — I509 Heart failure, unspecified: Secondary | ICD-10-CM

## 2013-10-09 DIAGNOSIS — I7100 Dissection of unspecified site of aorta: Secondary | ICD-10-CM

## 2013-10-09 DIAGNOSIS — I7101 Dissection of thoracic aorta: Secondary | ICD-10-CM

## 2013-10-09 DIAGNOSIS — I340 Nonrheumatic mitral (valve) insufficiency: Secondary | ICD-10-CM

## 2013-10-09 DIAGNOSIS — I079 Rheumatic tricuspid valve disease, unspecified: Secondary | ICD-10-CM

## 2013-10-09 NOTE — Progress Notes (Signed)
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 He underwent emergency surgical repair with hemi-arch replacement of the ascending thoracic aorta and resuspension of the native aortic valve. 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  CT angiograms of the chest, abdomen and pelvis have remained stable with mild gradual enlargement of the transverse aortic arch and the non-coronary sinus of valsalva.  However, he has also developed worsening chronic diastolic congestive heart failure with particularly severe right-sided heart failure.  He has a long history of hypertension with hypertensive cardiomyopathy and exertional shortness of breath. He had an echocardiogram performed in March of 2011 that revealed normal left ventricular systolic function but flattened septum consistent with a right ventricular pressure and volume overload. At that time he was noted to have mild to moderate mitral regurgitation with moderate to severe tricuspid regurgitation. The patient notes that over the last 3-4 years he has had a gradual progression of symptoms of mild exertional shortness of breath. He states that up until the past year his breathing only bothered him when he is swimming. He used to swim on a regular basis for exercise, but within the last year he got to the point where he could no longer swim because of dyspnea. He states that otherwise with normal activity he does not get short of breath, but he has developed worsening fatigue.  Patient also has a long history of chronic bilateral lower extremity edema. Last spring his lower extremity edema considerably worse, ultimately prompting him to present to [name] for evaluation who referred him back to Dr. [name] for follow up. At that time he was noted to be in stable rate controlled atrial fibrillation. Repeat transthoracic echocardiogram demonstrated stable left ventricular systolic function with ejection fraction estimated 50-55%. There was reportedly mild to moderate mitral regurgitation with left and right atrial chamber enlargement. There was severe tricuspid regurgitation with flow reversal in the hepatic veins. Pulmonary artery pressures were estimated 44 mm mercury. Transesophageal echocardiogram confirmed the presence of severe tricuspid regurgitation and moderate mitral regurgitation. The patient was referred for surgical consultation to consider tricuspid valve repair and possible Maze procedure.  I saw him in consultation on 02/20/2013.  I recommended medical therapy at that time. The patient initially improved with medical therapy, but he was hospitalized this past December with acute exacerbation of right heart failure including severe bilateral lower extremity edema and generalized anasarca. A followup echocardiogram was not performed during that hospitalization but the patient was started on Coumadin for long-term anticoagulation because of chronic persistent atrial fibrillation.  During that hospitalization discussions were entertained regarding referring him to the advanced heart failure clinic for evaluation and long-term followup, but apparently a followup appointment was not scheduled.  He was diuresed more than 20 pounds during his hospitalization and taken off of all antihypertensive medications.  The patient reports that since hospital discharge his weight has continued to trend down another 10 pounds.  He  was seen in followup by Tarri Fuller at Dr. Kennon Holter office last  week at which time his Lasix dose was decreased to 40 mg daily.  The patient's functional status has been chronically limited over the years because of his long-standing battle with multiple sclerosis. His overall strength and physical mobility has slowly deteriorated over the years. This has progressed substantially over the past year, and the patient now ambulates using a walker.  He states that he gets tired and short of breath with activity, but his mobility is limited more by his weakness from MS and less by his exertional shortness of breath. He denies any tachypalpitations or dizzy spells. He has not had any problems with Coumadin therapy so far.  Current Outpatient Prescriptions  Medication Sig Dispense Refill  . AMPYRA 10 MG TB12 TAKE 1 TABLET TWICE A DAY  180 tablet  1  . Docusate Calcium (STOOL SOFTENER PO) 1 tablet as needed.       . ferrous sulfate 325 (65 FE) MG tablet Take 1 tablet (325 mg total) by mouth 3 (three) times daily with meals.  90 tablet  1  . folic acid (FOLVITE) 1 MG tablet Take 1 mg by mouth daily.        . furosemide (LASIX) 40 MG tablet Take 1 tablet (40 mg total) by mouth 2 (two) times daily.  60 tablet  1  . potassium chloride (K-DUR) 10 MEQ tablet Take 10 mEq by mouth daily. 1 tablet every time he takes lasix      . QUEtiapine (SEROQUEL) 25 MG tablet Take 25 mg by mouth at bedtime as needed.       . rosuvastatin (CRESTOR) 5 MG tablet Take 5 mg by mouth daily.        . TECFIDERA 120 & 240 MG MISC Take 1 capsule by mouth as directed.      . warfarin (COUMADIN) 2 MG tablet Take 2 mg by mouth See admin instructions. Take 1 tablet by mouth daily or as directed. Take 1mg  half of the 2mg  on Mondays.       No current facility-administered medications for this visit.      Physical Exam:   BP 146/88  Pulse 96  Resp 20  Ht 5\' 8"  (1.727 m)  Wt 168 lb (76.204 kg)  BMI 25.55 kg/m2  SpO2 98%  General:  Chronically ill-appearing in no distress with obvious severe  JVD  Chest:   Slightly diminished breath sounds right lung base, otherwise clear  CV:   Irregular rate and rhythm  Incisions:  n/a  Abdomen:  Soft, non-distended, non-tender, no obvious ascites  Extremities:  Moderate-severe bilateral LE edema, decreased over past month  Diagnostic Tests:  n/a   Impression:  The patient has severe tricuspid regurgitation that is chronic and long-standing. Transesophageal echocardiogram performed last summer confirmed functional anatomy suggestive of pure annular dilatation with type I dysfunction and severe right atrial enlargement. The patient also has type 1 dysfunction of the mitral valve with moderate mitral regurgitation. Left ventricular systolic function appeared normal with exception of septal flattening do to right ventricular pressure and volume overload, although he has not had a follow up echocardiogram over the past 6 months. The patient has chronic bilateral lower extremity edema and exertional shortness of breath that has progressed, all of which coincident with the development of persistent atrial fibrillation. All of his symptoms have improved considerably over the last 6 weeks with diuretic therapy, and he reportedly has diuresed over 30  pounds of fluid. Unfortunately, risks associated with tricuspid valve repair or replacement with or without concomitant Maze procedure and or mitral valve repair would be extraordinarily high in this patient. The patient suffers chronically from multiple sclerosis with significant chronic weakness, instability of gait, and dysphagia with tendency for chronic aspiration. In addition, the patient has chronic dissection involving the aortic arch and descending thoracic and abdominal aorta with significant aneurysmal enlargement of the transverse aortic arch and aortic root. This aneurysmal enlargement had remained stable but slightly greater than 5 cm in diameter at the time it was imaged last May.     Plan:  We  will make certain the patient has been scheduled for evaluation in the advanced heart failure clinic. Might be reasonable to check a followup echocardiogram at this time. We will plan to see him back in 3 months to see how he is getting along and consider obtaining a followup CT angiogram of the aorta if his renal function remains stable.  I spent in excess of 30 minutes with the patient and his wife in the office today discussing his complex situation at length. All their questions been addressed.   Valentina Gu. Roxy Manns, MD 10/09/2013 6:41 PM

## 2013-10-09 NOTE — Patient Instructions (Signed)
Minimize salt intake and continue to monitor your weight on a daily basis.  Schedule followup in the advanced heart failure clinic as soon as practical.

## 2013-10-10 ENCOUNTER — Other Ambulatory Visit: Payer: Self-pay | Admitting: *Deleted

## 2013-10-16 ENCOUNTER — Other Ambulatory Visit: Payer: Self-pay | Admitting: *Deleted

## 2013-10-16 DIAGNOSIS — I71011 Dissection of aortic arch: Secondary | ICD-10-CM

## 2013-10-16 DIAGNOSIS — I7101 Dissection of thoracic aorta: Secondary | ICD-10-CM

## 2013-10-16 DIAGNOSIS — I340 Nonrheumatic mitral (valve) insufficiency: Secondary | ICD-10-CM

## 2013-10-17 ENCOUNTER — Other Ambulatory Visit: Payer: Self-pay | Admitting: *Deleted

## 2013-10-17 DIAGNOSIS — I059 Rheumatic mitral valve disease, unspecified: Secondary | ICD-10-CM

## 2013-10-17 DIAGNOSIS — I509 Heart failure, unspecified: Secondary | ICD-10-CM

## 2013-10-18 ENCOUNTER — Encounter (HOSPITAL_COMMUNITY): Payer: Self-pay

## 2013-10-18 ENCOUNTER — Ambulatory Visit (INDEPENDENT_AMBULATORY_CARE_PROVIDER_SITE_OTHER): Payer: Medicare Other | Admitting: Pharmacist Clinician (PhC)/ Clinical Pharmacy Specialist

## 2013-10-18 ENCOUNTER — Ambulatory Visit (HOSPITAL_COMMUNITY)
Admission: RE | Admit: 2013-10-18 | Discharge: 2013-10-18 | Disposition: A | Discharge status: Home / Self Care | Payer: Medicare Other | Source: Ambulatory Visit | Attending: Internal Medicine | Admitting: Internal Medicine

## 2013-10-18 VITALS — BP 168/82 | HR 84

## 2013-10-18 VITALS — BP 156/78 | HR 93 | Ht 68.0 in | Wt 164.0 lb

## 2013-10-18 DIAGNOSIS — I509 Heart failure, unspecified: Secondary | ICD-10-CM | POA: Insufficient documentation

## 2013-10-18 DIAGNOSIS — I7101 Dissection of thoracic aorta: Secondary | ICD-10-CM

## 2013-10-18 DIAGNOSIS — Z79899 Other long term (current) drug therapy: Secondary | ICD-10-CM | POA: Insufficient documentation

## 2013-10-18 DIAGNOSIS — I50810 Right heart failure, unspecified: Secondary | ICD-10-CM

## 2013-10-18 DIAGNOSIS — I71011 Dissection of aortic arch: Secondary | ICD-10-CM

## 2013-10-18 DIAGNOSIS — I4819 Other persistent atrial fibrillation: Secondary | ICD-10-CM

## 2013-10-18 DIAGNOSIS — I071 Rheumatic tricuspid insufficiency: Secondary | ICD-10-CM

## 2013-10-18 DIAGNOSIS — I4891 Unspecified atrial fibrillation: Secondary | ICD-10-CM | POA: Insufficient documentation

## 2013-10-18 DIAGNOSIS — I059 Rheumatic mitral valve disease, unspecified: Secondary | ICD-10-CM | POA: Insufficient documentation

## 2013-10-18 DIAGNOSIS — I079 Rheumatic tricuspid valve disease, unspecified: Secondary | ICD-10-CM

## 2013-10-18 DIAGNOSIS — I7100 Dissection of unspecified site of aorta: Secondary | ICD-10-CM | POA: Insufficient documentation

## 2013-10-18 DIAGNOSIS — G35 Multiple sclerosis: Secondary | ICD-10-CM | POA: Insufficient documentation

## 2013-10-18 DIAGNOSIS — Z7901 Long term (current) use of anticoagulants: Secondary | ICD-10-CM | POA: Insufficient documentation

## 2013-10-18 DIAGNOSIS — F329 Major depressive disorder, single episode, unspecified: Secondary | ICD-10-CM | POA: Insufficient documentation

## 2013-10-18 DIAGNOSIS — F3289 Other specified depressive episodes: Secondary | ICD-10-CM | POA: Insufficient documentation

## 2013-10-18 DIAGNOSIS — I71019 Dissection of thoracic aorta, unspecified: Secondary | ICD-10-CM

## 2013-10-18 DIAGNOSIS — N400 Enlarged prostate without lower urinary tract symptoms: Secondary | ICD-10-CM | POA: Insufficient documentation

## 2013-10-18 DIAGNOSIS — I10 Essential (primary) hypertension: Secondary | ICD-10-CM | POA: Insufficient documentation

## 2013-10-18 LAB — POCT INR: INR: 1.5

## 2013-10-18 NOTE — Progress Notes (Signed)
Patient ID: [name], male   DOB: Mar 18, 1946, 68 y.o.   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 Clydene Laming for further evaluation for possible TV repair.   [name] presented acutely on October 15, 1998 with acute type A aortic dissection. [name] underwent emergency surgical repair with hemi-arch replacement of the ascending thoracic aorta and resuspension of the native aortic valve. [name] has been followed regularly since then with CT angiograms because of chronic dissection of the remaining aorta and moderate aneurysmal dilatation of the transverse aortic arch. CT angiograms of the chest, abdomen and pelvis have remained stable with mild gradual enlargement of the transverse aortic arch and the non-coronary sinus of valsalva. By report, most recent transverse arch dimension has been > 5.5cm. I do not have the scan available to review.  In 2011 developed RHF. TEE in 6/14 reviewed. [name] has low normal LV function with markedly dilated RA/RV and severe TR. RV function relatively well preserved. Mild to moderate Timothy. Repeat transthoracic echocardiogram demonstrated stable left ventricular systolic function with ejection fraction estimated 50-55%. There was reportedly mild to moderate mitral regurgitation with left and right atrial chamber enlargement. There was severe tricuspid regurgitation with flow reversal in the hepatic veins. Pulmonary artery pressures were estimated 44 mm mercury.  Had been doing well - swimming routinely up until end of December when [name] was admitted with acute exacerbation of right heart failure including severe bilateral lower extremity edema and generalized anasarca. Diuresed well and was started on Coumadin for long-term anticoagulation because of  chronic persistent atrial fibrillation.   On d/c was requiring lasix 40 bid but over past 10 days now maintaining weight on 40mg  daily.  Denies dyspnea or orthopnea. Still with residual edema. Continues to struggle with severe MS. Has not been able to swim recently. Can walk slowly with a cane. Working with PT and was able to walked 15 min the other day. Feels [name] is getting stronger. Follows with Dr. Jae Dire in Neurology who feels his prognosis is fairly good.   [name] has seen [name] for possible TV and aortic arch rpair but there was concern about his operative risk and fact that his dissection may prohibit safe cannulation for cardiopulmonary bypass.    Review of Systems:     Cardiac Review of Systems: {Y] = yes [ ]  = no  Chest Pain [    ]  Resting SOB [   ] Exertional SOB  [  ]  Orthopnea [  ]   Pedal Edema [  y ]    Palpitations [  ] Syncope  [  ]   Presyncope [   ]  General Review of Systems: [Y] = yes [  ]=no Constitional: recent weight change [  ]; anorexia [  ]; fatigue Blue.Reese  ]; nausea [  ]; night sweats [  ]; fever [  ]; or chills [  ];  Eye : blurred vision [  ]; diplopia [   ]; vision changes [  ];  Amaurosis fugax[  ]; Resp: cough [  ];  wheezing[  ];  hemoptysis[  ]; shortness of breath[  ]; paroxysmal nocturnal dyspnea[  ]; dyspnea on exertion[  ]; or orthopnea[  ];  GI:  gallstones[  ], vomiting[  ];  dysphagia[  ]; melena[  ];  hematochezia [  ]; heartburn[  ];    GU: kidney stones [  ]; hematuria[  ];   dysuria [  ];  nocturia[  ];  history of     obstruction [  ];                 Skin: rash, swelling[  ];, hair loss[  ];  peripheral edema[  ];  or itching[  ]; Musculosketetal: myalgias[  ];  joint swelling[  ];  joint erythema[  ];  joint pain[  ];  back pain[  ];  Heme/Lymph: bruising[  ];  bleeding[  ];  anemia[  ];  Neuro: TIA[  ];   headaches[  ];  stroke[  ];  vertigo[  ];  seizures[  ];   paresthesias[  ];  difficulty walking[  ];  Psych:depression[  ]; anxiety[  ];  Endocrine: diabetes[  ];  thyroid dysfunction[  ];  Immunizations: Flu [  ]; Pneumococcal[  ];  Other:    Past Medical History  Diagnosis Date  . Neuromuscular disorder   . Hypertension   . Anemia   . Depression   . Multiple sclerosis   . Thrombocytopenia   . Dyslipidemia   . Aortic dissection, thoracic   . BPH (benign prostatic hyperplasia)   . Hemorrhoids   . Aortic dissection 11/05/1998    S/P emergency repair of acute type A aortic dissection with resuspension of native aortic valve  . 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 CHF (congestive heart failure) 09/22/46    2D Echo - EF 50-55%, mild-moderately dilated right ventricle, mild-moderate tricuspid valve regurgitation, moderately dilated right atrium  . Bilateral lower extremity edema   . Atrial fibrillation 01/30/2013    Atrial fibrillation   . Pleural effusion, right, large 01/30/2013  . Severe tricuspid valve regurgitation 01/30/2013    Severe tricuspid regurg by recent 2-D echo with a dilated tricuspid annulus, moderate pulmonary hypertension and biatrial enlargement   . Mitral regurgitation 02/20/2013  . Exertional shortness of breath     "for awhile now" (09/11/2013)  . History of blood transfusion     Current Outpatient Prescriptions  Medication Sig Dispense Refill  . AMPYRA 10 MG TB12 TAKE 1 TABLET TWICE A DAY  180 tablet  1  . Docusate Calcium (STOOL SOFTENER PO) 1 tablet as needed.       . ferrous sulfate 325 (65 FE) MG tablet Take 1 tablet (325 mg total) by mouth 3 (three) times daily with meals.  90 tablet  1  . folic acid (FOLVITE) 1 MG tablet Take 1 mg by mouth daily.        . furosemide (LASIX) 40 MG tablet Take 1 tablet (40 mg total) by mouth 2  (two) times daily.  60 tablet  1  . potassium chloride (K-DUR) 10 MEQ tablet Take 10 mEq by mouth daily. 1 tablet every time [name] takes lasix      .  QUEtiapine (SEROQUEL) 25 MG tablet Take 25 mg by mouth at bedtime as needed.       . rosuvastatin (CRESTOR) 5 MG tablet Take 5 mg by mouth daily.        . TECFIDERA 120 & 240 MG MISC Take 1 capsule by mouth as directed.      . warfarin (COUMADIN) 2 MG tablet Take 2 mg by mouth See admin instructions. Take 1 tablet by mouth daily or as directed. Take 1mg  half of the 2mg  on Mondays.       No current facility-administered medications for this encounter.     No Known Allergies  History   Social History  . Marital Status: Married    Spouse Name: Debbie    Number of Children: 2  . Years of Education: Ph.D   Occupational History  . 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 Smoking status: Never Smoker   . Smokeless tobacco: Never Used  . Alcohol Use: Yes     Comment: 09/11/2013 "might have a drink once/month"  . Drug Use: No  . Sexual Activity: Not Currently   Other Topics Concern  . Not on file   Social History Narrative   Patient lives at home with spouse.   Caffeine Use: eat a lot of chocolate, sodas, coffee and tea occasionally    Family History  Problem Relation Age of Onset  . Thyroid cancer Father     smoked  . Lung cancer Brother     smoked  . Heart attack Mother   . Emphysema Brother     smoked    PHYSICAL EXAM: Filed Vitals:   10/18/13 1357  BP: 156/78  Pulse: 93   General:  Fatigued appearing. No respiratory difficulty HEENT: normal Neck: supple. No JVP 8-9 + prominent CV waves. Carotids 2+ bilat; no bruits. No lymphadenopathy or thryomegaly appreciated. Cor: PMI nondisplaced. Irregular rate & rhythm.2/6 TR with RV lift increasesd P2 Lungs: clear. Decreased R base Abdomen: soft, nontender, Mildly distended. No hepatosplenomegaly. No bruits or masses. Good bowel  sounds. Extremities: no cyanosis, clubbing, rash, 1-2+ edema with chronic venous stasis.  Neuro: alert & oriented x 3, cranial nerves grossly intact. moves all 4 extremities w/o difficulty. Affect pleasant.  No results found for this or any previous visit (from the past 24 hour(s)). No results found.   ASSESSMENT & PLAN: 1. Right heart failure with severe TR 2. Aortic dissection     --s/p hemi-arch replacement of the ascending thoracic aorta and resuspension of the native aortic valve 2000    --now with chronic dissection and enlarging aneurysm involving sinus of Valsalva and aortic arch 3. Mild Timothy 4. Chronic AF 5. Severe multiple sclerosis  This is very difficult situation. [name] has significant RHF in the setting of severe TR and ideally I think [name] needs TVR to help with symptom management and to prevent RV failure. Unfortunately the situation is complicated by his aortic dissection and severe multiple sclerosis. From a functional standpoint I think we could get him ready for TVR with a month or two of rehab but the larger challenge seems to be what to do about his dissection and how one would approach his surgery. I have discussed this at length with [name] who will discuss with surgical colleagues at [facility]. I will reach out to discuss with Dr. Aline Brochure at [facility] to discuss as well.   Timothy Pellecchia,MD 11:48 AM

## 2013-10-20 DIAGNOSIS — I50810 Right heart failure, unspecified: Secondary | ICD-10-CM | POA: Insufficient documentation

## 2013-10-23 ENCOUNTER — Ambulatory Visit: Payer: Medicare Other | Admitting: Nurse Practitioner

## 2013-10-23 ENCOUNTER — Encounter: Payer: Self-pay | Admitting: Thoracic Surgery (Cardiothoracic Vascular Surgery)

## 2013-10-25 ENCOUNTER — Encounter: Payer: Self-pay | Admitting: Nurse Practitioner

## 2013-10-25 ENCOUNTER — Telehealth: Payer: Self-pay | Admitting: Diagnostic Neuroimaging

## 2013-10-25 ENCOUNTER — Ambulatory Visit (INDEPENDENT_AMBULATORY_CARE_PROVIDER_SITE_OTHER): Payer: Medicare Other | Admitting: Nurse Practitioner

## 2013-10-25 VITALS — BP 157/84 | HR 76 | Wt 159.0 lb

## 2013-10-25 DIAGNOSIS — F3289 Other specified depressive episodes: Secondary | ICD-10-CM

## 2013-10-25 DIAGNOSIS — R269 Unspecified abnormalities of gait and mobility: Secondary | ICD-10-CM

## 2013-10-25 DIAGNOSIS — F32A Depression, unspecified: Secondary | ICD-10-CM

## 2013-10-25 DIAGNOSIS — G35 Multiple sclerosis: Secondary | ICD-10-CM

## 2013-10-25 DIAGNOSIS — F329 Major depressive disorder, single episode, unspecified: Secondary | ICD-10-CM

## 2013-10-25 NOTE — Patient Instructions (Addendum)
PLAN: 1. Continue Tecfidera and Ampyra. 2. Follow up in 6 months, sooner as needed. If there is any quick decline in functioning, please call the office.  Prescription given for Travel/Folding Wheelchair.

## 2013-10-25 NOTE — Progress Notes (Signed)
PATIENT: [name] DOB: 24-Oct-1945   REASON FOR VISIT: follow up for MS HISTORY FROM: patient  HISTORY OF PRESENT ILLNESS: UPDATE 10/25/13 (LL): Patient comes in for revisit, since last visit he had worsening right-sided HF and was hospitalized.  He was seen by [name] for surgical consultation to consider tricuspid valve repair and possible Maze procedure.  He was referred to the advanced heart failure clinic who has been adjusting his lasix to manage his lower extremity edema and shortness of breath.  He is to be seen by [name] again for follow up in another month, and says [name] felt that surgery was needed.  He comes in today to discuss with [name] how his MS would be affected by having the surgery.  His wife feels like he is having more difficulty walking since last visit, but he has not had any falls.  He is tolerating Tecfidera and Ampyra well.  UPDATE 07/19/13 (VP): Patient continues to have progressive deterioration in cognitive ability, memory, mood lability, gait stability. Patient is on tecfidera now, and tolerating without side effects. Patient feels when he came off of Betaseron he had some accelerated progression of MS that this has slowed down since starting tecfidera. Unclear whether patient's current level of functioning is on the same trajectory as the decline that was noted while patient was on Betaseron, or whether there has been some acceleration of deterioration. Other factors include some marital discord and comorbid depression. Other contributing factors include his cardiac issues including atrial fibrillation, on Coumadin, as well as severe tricuspid valve regurgitation leading to peripheral edema. Unfortunately patient is not felt to be a good surgical candidate.   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 The patient is accompanied by his wife. Pt. States he is doing worse  since his last visit in Feb. He is walking slower and having more difficulty standing from a seated position. 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 [name] had talked to him about changing MS medications and he thinks he wants to. Having more memory problems. Continuing on betaseron right now.   PRIOR HPI (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 with a history of multiple sclerosis diagnosed in September 1996. He has been on Betaseron since 1996 and has right leg spasticity requiring him to use a cane in his left hand.He does not have any significant side effects from the Betaseron. Blood studies 02/10/2012 are normal CBC and CMP except for a low platelet count of 63K, Hgb 12.1 and alk Phos of 253. This is being followed by [name] and [name]. He has never had lymphopenia or neutropenia. There is no change in his symptoms.  He denies bowel or bladder incontinence, weakness, Lhermitte's sign, or double vision. He swims 3 times per week for 10 laps which is a little over a quarter of a mile, claims he is winded afterwards. He has not fallen, uses a cane in his left hand. MRI of the brain and cervical spine with and without contrast enhancement 03/26/2010 showed multiple subcortical, periventricular, and brainstem white matter hyperintensities without enhancing lesions present. There were T1 black holes and atrophy present. The cervical MRI showed a large central disc protrusion at C5-6 and spinal cord hyperintensities at C4 and brain stem representing remote demyelinating plaques without enhancing lesions  present. Ampyra was started November 2012 with improvement in gait. He was pleased with the addition of the drug and his response. Pt denies injection site problems however he says he is tired of shots. He has been on [employer] since 1996 and we have talked about whether changing to an oral  agent should be considered. He had several falls with his right leg giving way on standing. He has right knee pain. He states his memory is worse. He denies numbness delusions or depression. 07/11/2012= (MMSE29/30. Clock drawing task4/4. Animal fluency test 17).  His wife has noted more problems walking which the patient is hesitant to discuss. He does admit since calling me for a work in appointment , that he believes he is having more difficulty walking. He uses a walker at home and is noticeably slower. He uses the walker to get out of a bed that is low and out of a chair. He notices shortness of breath on swimming. His swallowing is okay except for occasional difficulty with liquids. He has right knee pain when walking. Betaseron seems to help his knee pain, walking, and his eyes after an injection. He is hesitant to go off of the medication for 3 weeks. He has increased spasms in his right foot and leg that are also moving to the left leg. He has swelling in his legs treated with furosemide.   REVIEW OF SYSTEMS: Full 14 system review of systems performed and notable only for leg swelling.  ALLERGIES: No Known Allergies  HOME MEDICATIONS: Outpatient Prescriptions Prior to Visit  Medication Sig Dispense Refill  . AMPYRA 10 MG TB12 TAKE 1 TABLET TWICE A DAY  180 tablet  1  . Docusate Calcium (STOOL SOFTENER PO) 1 tablet as needed.       . ferrous sulfate 325 (65 FE) MG tablet Take 1 tablet (325 mg total) by mouth 3 (three) times daily with meals.  90 tablet  1  . folic acid (FOLVITE) 1 MG tablet Take 1 mg by mouth daily.        . potassium chloride (K-DUR) 10 MEQ tablet Take 10 mEq by mouth daily. 1 tablet every time he takes lasix      . QUEtiapine (SEROQUEL) 25 MG tablet Take 25 mg by mouth at bedtime as needed.       . rosuvastatin (CRESTOR) 5 MG tablet Take 5 mg by mouth daily.        . TECFIDERA 120 & 240 MG MISC Take 1 capsule by mouth as directed.      . warfarin (COUMADIN) 2 MG tablet  Take 2 mg by mouth See admin instructions. Take 1 tablet by mouth daily or as directed. Take 3m half of the 229mon Mondays.      . furosemide (LASIX) 40 MG tablet Take 1 tablet (40 mg total) by mouth 2 (two) times daily.  60 tablet  1   No facility-administered medications prior to visit.    PAST MEDICAL HISTORY: Past Medical History  Diagnosis Date  . Neuromuscular disorder   . Hypertension   . Anemia   . Depression   . Multiple sclerosis   . Thrombocytopenia   . Dyslipidemia   . Aortic dissection, thoracic   . BPH (benign prostatic hyperplasia)   . Hemorrhoids   . Aortic dissection 11/05/1998    S/P emergency repair of acute type A aortic dissection with resuspension of native aortic valve  . Aneurysm of aortic arch 04/04/2012    Chronic aneurysmal  dilatation of aortic arch with chronic type A aortic dissection, s/p replacement of ascending thoracic aorta  . CHF (congestive heart failure) 08/08/47    2D Echo - EF 50-55%, mild-moderately dilated right ventricle, mild-moderate tricuspid valve regurgitation, moderately dilated right atrium  . Bilateral lower extremity edema   . Atrial fibrillation 01/30/2013    Atrial fibrillation   . Pleural effusion, right, large 01/30/2013  . Severe tricuspid valve regurgitation 01/30/2013    Severe tricuspid regurg by recent 2-D echo with a dilated tricuspid annulus, moderate pulmonary hypertension and biatrial enlargement   . Mitral regurgitation 02/20/2013  . Exertional shortness of breath     "for awhile now" (09/11/2013)  . History of blood transfusion     PAST SURGICAL HISTORY: Past Surgical History  Procedure Laterality Date  . Hemorrhoid surgery  2009    Internal, external hemorrhoidectomy, general anesthesia,prone position. [Other]  . Open reduction and internal fixation of right distal radius fracture using hand innovations distal radius volar locking plate.  07/2005    Dr Ninfa Linden  . Tee without cardioversion N/A 02/17/2013     Procedure: TRANSESOPHAGEAL ECHOCARDIOGRAM (TEE);  Surgeon: [name], MD;  Location: [facility] ENDOSCOPY;  Service: Cardiovascular;  Laterality: N/A;  . Repair of acute type a aortic dissection with resuspension of native aortic valve  10/15/1998    Dr Roxy Manns    FAMILY HISTORY: Family History  Problem Relation Age of Onset  . Thyroid cancer Father     smoked  . Lung cancer Brother     smoked  . Heart attack Mother   . Emphysema Brother     smoked    SOCIAL HISTORY: History   Social History  . Marital Status: Married    Spouse Name: Debbie    Number of Children: 2  . Years of Education: Ph.D   Occupational History  . 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 Smoking status: Never Smoker   . Smokeless tobacco: Never Used  . Alcohol Use: Yes     Comment: 09/11/2013 "might have a drink once/month"  . Drug Use: No  . Sexual Activity: Not Currently   Other Topics Concern  . Not on file   Social History Narrative   Patient lives at home with spouse.   Caffeine Use: eat a lot of chocolate, sodas, coffee and tea occasionally     PHYSICAL EXAM  Filed Vitals:   10/25/13 1536  BP: 157/84  Pulse: 76  Weight: 159 lb (72.122 kg)   Body mass index is 24.18 kg/(m^2).  Generalized: Well developed, in no acute distress  Head: normocephalic and atraumatic. Oropharynx benign  Neck: Supple, no carotid bruits  Cardiac: Regular rate rhythm, no murmur  Musculoskeletal: No deformity   Neurological examination  MENTAL STATUS: awake, alert, language fluent, comprehension intact, naming intact  CRANIAL NERVE: no papilledema on fundoscopic exam, pupils equal and reactive to light, visual fields full to confrontation, extraocular muscles SHOW SACCADIC BREAKDOWN OF SMOOTH PURSUIT; END GAZE NYSTAGMUS; PAST POINTING ON SACCADES. Facial sensation and WITH DECR RIGHT EYE CLOSURE WEAKNESS AND RIGHT LOWER FACIAL STRENGTH. Uvula midline, shoulder shrug  symmetric, tongue midline.  MOTOR: INCREASED TONE IN BUE AND BLE (RIGHT > LEFT). RIGHT LEG WEAKNESS (HF 3, KE 4, KF 3, DF 3).  SENSORY: DECR IN RLE TO ALL MODALITIES.  COORDINATION: finger-nose-finger, fine finger movements normal  REFLEXES: BUE 3 (R>L), RLE 3, LLE 2.  GAIT/STATION: DIFF STANDING FROM  CHAIR. USES A CANE. SPASTIC GAIT. RIGHT CIRCUMDUCTION. UNSTEADY.    DIAGNOSTIC DATA (LABS, IMAGING, TESTING) - I reviewed patient records, labs, notes, testing and imaging myself where available.  Lab Results  Component Value Date   WBC 5.6 09/18/2013   HGB 8.7* 09/18/2013   HCT 27.6* 09/18/2013   MCV 87.1 09/18/2013   PLT 150 09/18/2013      Component Value Date/Time   NA 147 09/18/2013 0554   K 3.9 09/18/2013 0554   CL 111 09/18/2013 0554   CO2 26 09/18/2013 0554   GLUCOSE 84 09/18/2013 0554   BUN 34* 09/18/2013 0554   CREATININE 1.47* 09/18/2013 0554   CALCIUM 8.9 09/18/2013 0554   PROT 6.6 09/12/2013 0900   ALBUMIN 3.2* 09/12/2013 0900   AST 15 09/12/2013 0900   ALT 17 09/12/2013 0900   ALKPHOS 306* 09/12/2013 0900   BILITOT 0.8 09/12/2013 0900   GFRNONAA 48* 09/18/2013 0554   GFRAA 56* 09/18/2013 0554   11/16/12 MRI brain - There are multiple periventricular and subcortical and pontine chronic demyelinating plaques. There are several T1 black holes. Mild diffuse atrophy. No acute plaques. In comparison to MRI from 03/26/10, there is no significant change.  08/02/13 MRI brain -  11/16/12 MRI cervical spine - Multiple chronic demyelinating plaques at C2-3, C3-4 and C5-6. No acute plaques. At C5-6: Disc bulging with facet hypertrophy with moderate spinal stenosis, severe right and moderate left foraminal stenosis. At C3-4, C4-5: Disc bulging with mild spinal stenosis and biforaminal stenosis. In comparison to MRI from 03/26/10 there is no significant change.   01/20/13 JCV antibody - positive, index 3.37 (H)   ASSESSMENT AND PLAN 68 y.o. year old male has a past medical history of Neuromuscular disorder;  Hypertension; Anemia; Depression; Multiple sclerosis; Thrombocytopenia; Dyslipidemia; Aortic dissection, thoracic; BPH (benign prostatic hyperplasia); Hemorrhoids; Aortic dissection (11/05/1998); Aneurysm of aortic arch (04/04/2012); Chest pain (11/18/2009); CHF (congestive heart failure) (1945-10-12); Bilateral lower extremity edema; Atrial fibrillation (01/30/2013); Pleural effusion, right, large (01/30/2013); Severe tricuspid valve regurgitation (01/30/2013); Mitral regurgitation (02/20/2013); and Lower extremity edema (02/20/2013) here with multiple sclerosis since 1996. Tolerating tecfidera, but continues to have progressive cognitive, emotional and physical decline. May be related to secondary progressive MS, cardiac issues, depression or a combination. This is a very difficult situation  Since last visit, he had hospitalization for right-sided HF and was referred to [name] for surgical consultation to consider tricuspid valve repair and possible Maze procedure.  [name] referred him to the advanced heart failure clinic, which he has had consultation with [name] as well.  Patient is waiting to hear whether he will be considered for surgery or not.  [name] and I discussed at length with the patient and his wife that surgery may not be an option due to the high risk nature of the surgery due to his multiple comorbid problems. He was urged to fully consider that even if surgery was an option, the recovery would be long and hard due to his MS.  PLAN: 1. Continue Tecfidera and Ampyra. 2. Follow up in 6 months with [name], sooner as needed.  Timothy Pali, MSN, NP-C 10/25/2013, 3:48 PM [facility] [address], [place], [place] [address] [phone]  Note: This document was prepared with digital dictation and possible smart phrase technology. Any transcriptional errors that result from this process are unintentional.

## 2013-10-25 NOTE — Telephone Encounter (Signed)
Patient at check out today forgot to ask [name] today during his visit whether he should continue to get more physical therapy. Please call patient and advise.

## 2013-10-26 ENCOUNTER — Encounter: Payer: Self-pay | Admitting: Nurse Practitioner

## 2013-10-26 NOTE — Telephone Encounter (Signed)
Please advise 

## 2013-10-27 NOTE — Telephone Encounter (Signed)
Please call patient, I think it is fine if Mr. Martinetti wants to continue with Physical Therapy. Let either [name] or me know if we need to enter a new order.

## 2013-10-30 NOTE — Telephone Encounter (Signed)
I spoke to pt and relayed that it was ok to continue PT from our standpoint.  I will call Arville Go [phone], ok to continue PT.  (therapist name is Merrilee Seashore). I spoke to Neoma Laming, Therapist, sports and gave her the verbal order to continue PT.  She verbalized understanding.

## 2013-10-31 NOTE — Progress Notes (Signed)
I reviewed note and agree with plan.  Difficult situation. Seems like the contemplated surgery would be risky with difficult recovery, given the nature of the surgery, co-morbid diseases including MS.  Penni Bombard, MD [id number], [id number] AM Certified in Neurology, Neurophysiology and [facility] [address], [place],  [address] [phone]

## 2013-11-01 ENCOUNTER — Ambulatory Visit (INDEPENDENT_AMBULATORY_CARE_PROVIDER_SITE_OTHER): Payer: Medicare Other | Admitting: Pharmacist Clinician (PhC)/ Clinical Pharmacy Specialist

## 2013-11-01 VITALS — BP 152/70 | HR 96

## 2013-11-01 DIAGNOSIS — I4819 Other persistent atrial fibrillation: Secondary | ICD-10-CM

## 2013-11-01 DIAGNOSIS — I4891 Unspecified atrial fibrillation: Secondary | ICD-10-CM

## 2013-11-01 DIAGNOSIS — Z7901 Long term (current) use of anticoagulants: Secondary | ICD-10-CM

## 2013-11-01 LAB — POCT INR: INR: 2.1

## 2013-11-07 ENCOUNTER — Encounter: Payer: Self-pay | Admitting: Nurse Practitioner

## 2013-11-08 ENCOUNTER — Other Ambulatory Visit: Payer: Self-pay | Admitting: *Deleted

## 2013-11-08 DIAGNOSIS — R0602 Shortness of breath: Secondary | ICD-10-CM

## 2013-11-14 ENCOUNTER — Telehealth (HOSPITAL_COMMUNITY): Payer: Self-pay | Admitting: Cardiology

## 2013-11-14 NOTE — Telephone Encounter (Signed)
Pt was not given follow up at last office visit Pt would like to know how Dr Haroldine Laws would like to proceed with care? When is follow up?

## 2013-11-14 NOTE — Telephone Encounter (Addendum)
Dr Haroldine Laws saw pt and was going to f/u with Dr Ricard Dillon and Dr Aline Brochure at [facility], will discuss w/Dr Bensimhon

## 2013-11-16 ENCOUNTER — Telehealth: Payer: Self-pay | Admitting: *Deleted

## 2013-11-16 NOTE — Telephone Encounter (Signed)
Asking re: pt receiving Tdap vacc prior to seeing grandbaby.  (pt is MS pt and is on tecfidera).  Consulted [name],  No live vaccines.  Tdap should be ok.  Relayed to [place].

## 2013-11-16 NOTE — Telephone Encounter (Signed)
Will call him tomorrow. Please place reminder in Clinic

## 2013-11-20 ENCOUNTER — Encounter: Payer: Self-pay | Admitting: Thoracic Surgery (Cardiothoracic Vascular Surgery)

## 2013-11-20 NOTE — Telephone Encounter (Signed)
Pt given follow up for  3/19 @ 220pm Pt aware of appt and parking options, garage code given

## 2013-11-20 NOTE — Telephone Encounter (Signed)
Dr Haroldine Laws spoke w/pt, we will begin to follow him in clinic, Chantel can you please schedule, thanks

## 2013-11-28 ENCOUNTER — Ambulatory Visit (HOSPITAL_COMMUNITY): Payer: BC Managed Care – PPO

## 2013-11-28 ENCOUNTER — Ambulatory Visit (HOSPITAL_COMMUNITY)
Admission: RE | Admit: 2013-11-28 | Discharge: 2013-11-28 | Disposition: A | Discharge status: Home / Self Care | Payer: Medicare Other | Source: Ambulatory Visit | Attending: Thoracic Surgery (Cardiothoracic Vascular Surgery) | Admitting: Thoracic Surgery (Cardiothoracic Vascular Surgery)

## 2013-11-28 DIAGNOSIS — N289 Disorder of kidney and ureter, unspecified: Secondary | ICD-10-CM | POA: Insufficient documentation

## 2013-11-28 DIAGNOSIS — I059 Rheumatic mitral valve disease, unspecified: Secondary | ICD-10-CM

## 2013-11-28 DIAGNOSIS — I4891 Unspecified atrial fibrillation: Secondary | ICD-10-CM | POA: Insufficient documentation

## 2013-11-28 DIAGNOSIS — I079 Rheumatic tricuspid valve disease, unspecified: Secondary | ICD-10-CM | POA: Insufficient documentation

## 2013-11-28 DIAGNOSIS — I379 Nonrheumatic pulmonary valve disorder, unspecified: Secondary | ICD-10-CM | POA: Insufficient documentation

## 2013-11-28 DIAGNOSIS — R0602 Shortness of breath: Secondary | ICD-10-CM | POA: Insufficient documentation

## 2013-11-28 DIAGNOSIS — I080 Rheumatic disorders of both mitral and aortic valves: Secondary | ICD-10-CM | POA: Insufficient documentation

## 2013-11-28 NOTE — Progress Notes (Signed)
*  PRELIMINARY RESULTS* Echocardiogram 2D Echocardiogram has been performed.  Leavy Cella 11/28/2013, 4:24 PM

## 2013-11-29 ENCOUNTER — Ambulatory Visit (INDEPENDENT_AMBULATORY_CARE_PROVIDER_SITE_OTHER): Payer: Medicare Other | Admitting: Pharmacist Clinician (PhC)/ Clinical Pharmacy Specialist

## 2013-11-29 VITALS — BP 140/72 | HR 80

## 2013-11-29 DIAGNOSIS — Z7901 Long term (current) use of anticoagulants: Secondary | ICD-10-CM

## 2013-11-29 DIAGNOSIS — I4891 Unspecified atrial fibrillation: Secondary | ICD-10-CM

## 2013-11-29 DIAGNOSIS — I4819 Other persistent atrial fibrillation: Secondary | ICD-10-CM

## 2013-11-29 LAB — POCT INR: INR: 1.6

## 2013-11-29 MED ORDER — WARFARIN SODIUM 2 MG PO TABS: ORAL_TABLET | ORAL | Status: DC

## 2013-11-30 ENCOUNTER — Encounter (HOSPITAL_COMMUNITY): Payer: Self-pay

## 2013-11-30 ENCOUNTER — Ambulatory Visit (HOSPITAL_COMMUNITY)
Admission: RE | Admit: 2013-11-30 | Discharge: 2013-11-30 | Disposition: A | Discharge status: Home / Self Care | Payer: Medicare Other | Source: Ambulatory Visit | Attending: Internal Medicine | Admitting: Internal Medicine

## 2013-11-30 VITALS — BP 146/98 | HR 87 | Wt 157.8 lb

## 2013-11-30 DIAGNOSIS — I079 Rheumatic tricuspid valve disease, unspecified: Secondary | ICD-10-CM | POA: Insufficient documentation

## 2013-11-30 DIAGNOSIS — I5032 Chronic diastolic (congestive) heart failure: Secondary | ICD-10-CM | POA: Insufficient documentation

## 2013-11-30 DIAGNOSIS — I071 Rheumatic tricuspid insufficiency: Secondary | ICD-10-CM

## 2013-11-30 DIAGNOSIS — I509 Heart failure, unspecified: Secondary | ICD-10-CM

## 2013-11-30 DIAGNOSIS — I4819 Other persistent atrial fibrillation: Secondary | ICD-10-CM

## 2013-11-30 DIAGNOSIS — I4891 Unspecified atrial fibrillation: Secondary | ICD-10-CM | POA: Insufficient documentation

## 2013-11-30 LAB — BASIC METABOLIC PANEL
BUN: 26 mg/dL — ABNORMAL HIGH (ref 6–23)
CO2: 26 mEq/L (ref 19–32)
Calcium: 9.7 mg/dL (ref 8.4–10.5)
Chloride: 101 mEq/L (ref 96–112)
Creatinine, Ser: 0.95 mg/dL (ref 0.50–1.35)
GFR calc Af Amer: >90 mL/min (ref >90)
GFR calc non Af Amer: 85 mL/min — ABNORMAL LOW (ref >90)
Glucose, Bld: 77 mg/dL (ref 70–99)
Potassium: 3.9 mEq/L (ref 3.7–5.3)
Sodium: 141 mEq/L (ref 137–147)

## 2013-11-30 MED ORDER — FUROSEMIDE 40 MG PO TABS: ORAL_TABLET | ORAL | Status: DC

## 2013-11-30 MED ORDER — POTASSIUM CHLORIDE ER 10 MEQ PO TBCR: 20.0000 meq | EXTENDED_RELEASE_TABLET | Freq: Every day | ORAL | Status: DC

## 2013-11-30 NOTE — Addendum Note (Signed)
Encounter addended by: Kerry Dory, CMA on: 11/30/2013  3:41 PM<BR>     Documentation filed: Visit Diagnoses, Patient Instructions Section, Orders

## 2013-11-30 NOTE — Patient Instructions (Signed)
INCREASE LASIX TO 40 MG IN THE AM AND 20 MG IN THE PM         POTASSIUM TO [place]  Your physician recommends that you schedule a follow-up appointment in: 3-4 weeks Do the following things EVERYDAY: 1) Weigh yourself in the morning before breakfast. Write it down and keep it in a log. 2) Take your medicines as prescribed 3) Eat low salt foods-Limit salt (sodium) to 2000 mg per day.  4) Stay as active as you can everyday 5) Limit all fluids for the day to less than 2 liters 6)

## 2013-11-30 NOTE — Progress Notes (Signed)
Patient ID: [name], male   DOB: 1946/04/07, 68 y.o.   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 Clydene Laming for further evaluation for possible TV repair.   He presented acutely on October 15, 1998 with acute type A aortic dissection. He underwent emergency surgical repair with hemi-arch replacement of the ascending thoracic aorta and resuspension of the native aortic valve. 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 CT angiograms of the chest, abdomen and pelvis have remained stable with mild gradual enlargement of the transverse aortic arch and the non-coronary sinus of valsalva. By report, most recent transverse arch dimension has been > 5.5cm. I do not have the scan available to review.  In 2011 developed RHF. TEE in 6/14 reviewed. He has low normal LV function with markedly dilated RA/RV and severe TR. RV function relatively well preserved. Mild to moderate Timothy. Repeat transthoracic echocardiogram demonstrated stable left ventricular systolic function with ejection fraction estimated 50-55%. There was reportedly mild to moderate mitral regurgitation with left and right atrial chamber enlargement. There was severe tricuspid regurgitation with flow reversal in the hepatic veins. Pulmonary artery pressures were estimated 44 mm mercury.  Admitted in December 2014 with right heart failure including severe bilateral lower extremity edema and generalized anasarca. Diuresed well and was started on Coumadin for long-term anticoagulation because of chronic persistent atrial fibrillation.   I saw him last month for initial  evaluation. After his visit I discussed his case with [name] who also discussed the situation with Dr. Mali Hughes at [facility] and they felt that heart surgery would be prohibitive due to chronic aortic dissection. Since that time he has also seen Dr. Jae Dire who felt surgery would be high risk due to MS.   Follow-up: Continues on lasix 40 mg daily. Weighing every day. Weight staying around 150 at home. Has not had to take extra lasix. In the morning hardly no edema by the end of the day he has some. Has finished rehab. Feeling stronger. Denies dyspnea.    PHYSICAL EXAM: Filed Vitals:   11/30/13 1458  BP: 146/98  Pulse: 87   General:  Walks with cane No respiratory difficulty HEENT: normal Neck: supple. No JVP 8-9 + prominent CV waves. Carotids 2+ bilat; no bruits. No lymphadenopathy or thryomegaly appreciated. Cor: PMI nondisplaced. Irregular rate & rhythm.2/6 TR with RV lift increasesd P2 Lungs: clear.  Abdomen: soft, nontender, Mildly distended. No hepatosplenomegaly. No bruits or masses. Good bowel sounds. Extremities: no cyanosis, clubbing, rash, 2+ edema with chronic venous stasis.  Neuro: alert & oriented x 3, cranial nerves grossly intact. moves all 4 extremities w/o difficulty. Affect pleasant.  ECHO 11/28/13  - Left ventricle: The cavity size was normal. Wall thickness was increased in a pattern of mild LVH. The estimated ejection fraction was 55%. - Aortic valve: Aortic Valve is not well seen There is mild AR. - Aorta: There is significant dilatation of the sinus of valsalva.4.8cm There is lots of shadowing artifact in the aortic root Suggest TEE or CT to further evaluate aorta - Mitral valve: Moderate regurgitation. - Left atrium: The atrium was moderately dilated. -  Right ventricle: The cavity size was mildly dilated. Wall thickness was normal. - Right atrium: The atrium was severely dilated. - Atrial septum: No defect or patent foramen ovale was identified. -  Tricuspid valve: Moderate regurgitation. - Pulmonary arteries: PA peak pressure: 37mm Hg (S).   ASSESSMENT & PLAN: 1. Right heart failure with severe TR 2. Aortic dissection     --s/p hemi-arch replacement of the ascending thoracic aorta and resuspension of the native aortic valve 2000    --now with chronic dissection and enlarging aneurysm involving sinus of Valsalva and aortic arch 3. Mild Timothy 4. Chronic AF 5. Severe multiple sclerosis  He is improved but still with fluid on board. Will add lasix 20 mg in the afternoon with 20 kcl. I discussed case with [name] and he has been deemed not to be a candidate for TVR. Will thus focus on tight volume management. Place TED hose. Suspect dry weight will be closer to 140-143 pounds. Check labs today. I will see him back in 3-4 weeks with labs.    Alie Moudy,MD 3:10 PM

## 2013-12-02 ENCOUNTER — Encounter: Payer: Self-pay | Admitting: Cardiovascular Disease

## 2013-12-04 NOTE — Telephone Encounter (Signed)
Message forwarded to Tommy Medal, PharmD.

## 2013-12-13 ENCOUNTER — Ambulatory Visit (INDEPENDENT_AMBULATORY_CARE_PROVIDER_SITE_OTHER): Payer: Medicare Other | Admitting: Pharmacist Clinician (PhC)/ Clinical Pharmacy Specialist

## 2013-12-13 DIAGNOSIS — Z7901 Long term (current) use of anticoagulants: Secondary | ICD-10-CM

## 2013-12-13 DIAGNOSIS — I4819 Other persistent atrial fibrillation: Secondary | ICD-10-CM

## 2013-12-13 DIAGNOSIS — I4891 Unspecified atrial fibrillation: Secondary | ICD-10-CM

## 2013-12-13 LAB — POCT INR: INR: 1.8

## 2013-12-20 ENCOUNTER — Ambulatory Visit (HOSPITAL_COMMUNITY)
Admission: RE | Admit: 2013-12-20 | Discharge: 2013-12-20 | Disposition: A | Discharge status: Home / Self Care | Payer: Medicare Other | Source: Ambulatory Visit | Attending: Internal Medicine | Admitting: Internal Medicine

## 2013-12-20 ENCOUNTER — Encounter (HOSPITAL_COMMUNITY): Payer: Self-pay

## 2013-12-20 VITALS — BP 142/80 | HR 91 | Wt 156.0 lb

## 2013-12-20 DIAGNOSIS — I4891 Unspecified atrial fibrillation: Secondary | ICD-10-CM

## 2013-12-20 DIAGNOSIS — I4819 Other persistent atrial fibrillation: Secondary | ICD-10-CM

## 2013-12-20 DIAGNOSIS — I5032 Chronic diastolic (congestive) heart failure: Secondary | ICD-10-CM | POA: Insufficient documentation

## 2013-12-20 DIAGNOSIS — I509 Heart failure, unspecified: Secondary | ICD-10-CM

## 2013-12-20 DIAGNOSIS — I071 Rheumatic tricuspid insufficiency: Secondary | ICD-10-CM

## 2013-12-20 DIAGNOSIS — I079 Rheumatic tricuspid valve disease, unspecified: Secondary | ICD-10-CM | POA: Insufficient documentation

## 2013-12-20 LAB — BASIC METABOLIC PANEL
BUN: 24 mg/dL — ABNORMAL HIGH (ref 6–23)
CO2: 25 mEq/L (ref 19–32)
Calcium: 9.4 mg/dL (ref 8.4–10.5)
Chloride: 104 mEq/L (ref 96–112)
Creatinine, Ser: 1.07 mg/dL (ref 0.50–1.35)
GFR calc Af Amer: 82 mL/min — ABNORMAL LOW (ref >90)
GFR calc non Af Amer: 70 mL/min — ABNORMAL LOW (ref >90)
Glucose, Bld: 138 mg/dL — ABNORMAL HIGH (ref 70–99)
Potassium: 3.7 mEq/L (ref 3.7–5.3)
Sodium: 143 mEq/L (ref 137–147)

## 2013-12-20 NOTE — Patient Instructions (Signed)
Lab today  We will contact you in 2 months to schedule your next appointment.

## 2013-12-20 NOTE — Progress Notes (Signed)
Patient ID: [name], male   DOB: [birth year], 68 y.o.   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 Clydene Laming for further evaluation for possible TV repair.   He presented acutely on October 15, 1998 with acute type A aortic dissection. He underwent emergency surgical repair with hemi-arch replacement of the ascending thoracic aorta and resuspension of the native aortic valve. 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 CT angiograms of the chest, abdomen and pelvis have remained stable with mild gradual enlargement of the transverse aortic arch and the non-coronary sinus of valsalva. By report, most recent transverse arch dimension has been > 5.5cm. I do not have the scan available to review.  In 2011 developed RHF. TEE in 6/14 reviewed. He has low normal LV function with markedly dilated RA/RV and severe TR. RV function relatively well preserved. Mild to moderate Timothy. Repeat transthoracic echocardiogram demonstrated stable left ventricular systolic function with ejection fraction estimated 50-55%. There was reportedly mild to moderate mitral regurgitation with left and right atrial chamber enlargement. There was severe tricuspid regurgitation with flow reversal in the hepatic veins. Pulmonary artery pressures were estimated 44 mm mercury.  Admitted in December 2014 with right heart failure including severe bilateral lower extremity edema and generalized anasarca. Diuresed well and was started on Coumadin for long-term anticoagulation because of chronic persistent atrial fibrillation.   I saw him last month for initial  evaluation. After his visit I discussed his case with [name] who also discussed the situation with Dr. Mali Hughes at [facility] and they felt that heart surgery would be prohibitive due to chronic aortic dissection. Since that time he has also seen Dr. Jae Dire who felt surgery would be high risk due to MS.   Follow-up: At last visit we added lasix 20mg  in the pm (takes 40 in am) in an effort to promote more diuresis and get weight 140-143. Weight ranging now 144-150. Wearing support stockings now. No edema in am. Mild edema in afternoon.    PHYSICAL EXAM: Filed Vitals:   12/20/13 1402  BP: 142/80  Pulse: 91   General:  Walks with cane No respiratory difficulty HEENT: normal Neck: supple. No JVP 6 + prominent CV waves. Carotids 2+ bilat; no bruits. No lymphadenopathy or thryomegaly appreciated. Cor: PMI nondisplaced. Irregular rate & rhythm.2/6 TR with RV lift increasesd P2 Lungs: clear.  Abdomen: soft, nontender, Mildly distended. No hepatosplenomegaly. No bruits or masses. Good bowel sounds. Extremities: no cyanosis, clubbing, rash, tr edema with chronic venous stasis. Wearing support stockings Neuro: alert & oriented x 3, cranial nerves grossly intact. moves all 4 extremities w/o difficulty. Affect pleasant.  ECHO 11/28/13  - Left ventricle: The cavity size was normal. Wall thickness was increased in a pattern of mild LVH. The estimated ejection fraction was 55%. - Aortic valve: Aortic Valve is not well seen There is mild AR. - Aorta: There is significant dilatation of the sinus of valsalva.4.8cm There is lots of shadowing artifact in the aortic root Suggest TEE or CT to further evaluate aorta - Mitral valve: Moderate regurgitation. - Left atrium: The atrium was moderately dilated. - Right  ventricle: The cavity size was mildly dilated. Wall thickness was normal. - Right atrium: The atrium was severely dilated. - Atrial septum: No defect or patent foramen ovale was identified. -  Tricuspid valve: Moderate regurgitation. - Pulmonary arteries: PA peak pressure: 36mm Hg (S).   ASSESSMENT & PLAN: 1. Right heart failure with severe TR 2. Aortic dissection     --s/p hemi-arch replacement of the ascending thoracic aorta and resuspension of the native aortic valve 2000    --now with chronic dissection and enlarging aneurysm involving sinus of Valsalva and aortic arch 3. Mild Timothy 4. Chronic AF 5. Severe multiple sclerosis   I discussed case with [name] and he has been deemed not to be a candidate for TVR. Will thus focus on tight volume management.   Volume status looks very good today on relatively low-dose lasix. Will continue current management. See back in 2 months. BMET today  Shaune Pascal Kadi Hession,MD 2:07 PM

## 2013-12-20 NOTE — Addendum Note (Signed)
Encounter addended by: Scarlette Calico, RN on: 12/20/2013  2:20 PM<BR>     Documentation filed: Visit Diagnoses, Patient Instructions Section, Orders

## 2014-01-01 ENCOUNTER — Ambulatory Visit (INDEPENDENT_AMBULATORY_CARE_PROVIDER_SITE_OTHER): Payer: Medicare Other | Admitting: Thoracic Surgery (Cardiothoracic Vascular Surgery)

## 2014-01-01 ENCOUNTER — Encounter: Payer: Self-pay | Admitting: Thoracic Surgery (Cardiothoracic Vascular Surgery)

## 2014-01-01 VITALS — BP 155/85 | HR 62 | Resp 20 | Ht 68.0 in | Wt 155.0 lb

## 2014-01-01 DIAGNOSIS — I5032 Chronic diastolic (congestive) heart failure: Secondary | ICD-10-CM

## 2014-01-01 DIAGNOSIS — I71019 Dissection of thoracic aorta, unspecified: Secondary | ICD-10-CM

## 2014-01-01 DIAGNOSIS — I4891 Unspecified atrial fibrillation: Secondary | ICD-10-CM

## 2014-01-01 DIAGNOSIS — I071 Rheumatic tricuspid insufficiency: Secondary | ICD-10-CM

## 2014-01-01 DIAGNOSIS — I7100 Dissection of unspecified site of aorta: Secondary | ICD-10-CM

## 2014-01-01 DIAGNOSIS — I4819 Other persistent atrial fibrillation: Secondary | ICD-10-CM

## 2014-01-01 DIAGNOSIS — I079 Rheumatic tricuspid valve disease, unspecified: Secondary | ICD-10-CM

## 2014-01-01 DIAGNOSIS — I509 Heart failure, unspecified: Secondary | ICD-10-CM

## 2014-01-01 DIAGNOSIS — I059 Rheumatic mitral valve disease, unspecified: Secondary | ICD-10-CM

## 2014-01-01 DIAGNOSIS — I340 Nonrheumatic mitral (valve) insufficiency: Secondary | ICD-10-CM

## 2014-01-01 DIAGNOSIS — I7101 Dissection of thoracic aorta: Secondary | ICD-10-CM

## 2014-01-01 DIAGNOSIS — I71011 Dissection of aortic arch: Secondary | ICD-10-CM

## 2014-01-01 NOTE — Patient Instructions (Signed)
The patient is reminded to avoid any sort of heavy lifting or straining because of his underlying chronic aortic dissection

## 2014-01-01 NOTE — Progress Notes (Signed)
[address]       [place] [address]             [phone]     CARDIOTHORACIC SURGERY OFFICE NOTE  Referring Provider is [name], MD PCP is GREEN, [name], MD   HPI:  Patient returns for followup of chronic type A aortic dissection s/p repair in 2000 with chronic right-sided congestive heart failure with severe tricuspid regurgitation. He has a complex history outlined in previous consultation reports an office notes. He was seen most recently on 10/09/2013.  He had been referred for possible tricuspid valve repair and Maze procedure, but risks would be prohibitive because of the patient's previous aortic dissection with severe aneurysmal enlargement of the aortic root and transverse aortic arch plus underlying severe chronic disability related to multiple sclerosis. His case was reviewed at length with Dr. Ysidro Evert at [facility].  Long term medical treatment has been recommended.  Since his last office visit the patient has been evaluated and followed carefully by [name] and colleagues at the advanced heart failure clinic, where he was seen most recently on 12/20/2013. He returns to the office today reporting that overall he remains essentially stable. He denies any problems with shortness of breath at present. His weight waxes and wanes a little bit, but for the most part he has been successful keeping his chronic volume overloaded lower extremity edema under fairly good control. His last followup echocardiogram actually looked improved with no signs of right ventricular chamber enlargement or dysfunction and only moderate tricuspid regurgitation. His biggest complaint relates to his underlying multiple sclerosis which has not improved in recent months.   Current Outpatient Prescriptions  Medication Sig Dispense Refill  . AMPYRA 10 MG TB12 TAKE 1 TABLET TWICE A DAY  180 tablet  1  . ferrous sulfate 325 (65 FE) MG tablet Take 1  tablet (325 mg total) by mouth 3 (three) times daily with meals.  90 tablet  1  . folic acid (FOLVITE) 1 MG tablet Take 1 mg by mouth daily.        . furosemide (LASIX) 40 MG tablet Take one tab (40 mg) by mouth in the AM and one half (20 mg) tab by mouth in the PM  45 tablet  3  . potassium chloride (K-DUR) 10 MEQ tablet Take 2 tablets (20 mEq total) by mouth daily.  60 tablet  3  . QUEtiapine (SEROQUEL) 25 MG tablet Take 25 mg by mouth at bedtime as needed.       . rosuvastatin (CRESTOR) 5 MG tablet Take 5 mg by mouth daily.        . TECFIDERA 120 & 240 MG MISC Take 1 capsule by mouth as directed.      . warfarin (COUMADIN) 2 MG tablet Take 1 tablet by mouth daily or as directed  90 tablet  1   No current facility-administered medications for this visit.      Physical Exam:   BP 155/85  Pulse 62  Resp 20  Ht 5\' 8"  (1.727 m)  Wt 155 lb (70.308 kg)  BMI 23.57 kg/m2  SpO2 98%  General:  Chronically ill-appearing  Chest:   clear  CV:   irregular rate and rhythm  Incisions:  n/a  Abdomen:  Soft, nondistended  Extremities:  No edema  Diagnostic Tests:  Transthoracic Echocardiography  (Report amended )  Patient: [name], [name] MR #: [id number] Study Date: 11/28/2013  Gender: M Age: 68 Height: 172.7cm Weight: 72.3kg BSA: 1.37m^2 Pt. Status: Room:  ATTENDING Darylene Price MD Andrena Mews MD REFERRING Lance Muss SONOGRAPHER Leavy Cella PERFORMING [facility], Outpatient cc:  ------------------------------------------------------------ LV EF: 55%  ------------------------------------------------------------ History: PMH: Aortic Dissection Type A, Severe Tricuspid Regurge, Mitral Regurge, Acute Renal Insufficency, Right Heart Failure Aortic Dissection and Right Side Failure Atrial fibrillation.  ------------------------------------------------------------ Study Conclusions  - Left ventricle: The cavity size was normal. Wall thickness was  increased in a pattern of mild LVH. The estimated ejection fraction was 55%. - Aortic valve: Aortic Valve is not well seen There is mild AR. - Aorta: There is significant dilatation of the sinus of valsalva.4.8cm There is lots of shadowing artifact in the aortic root Suggest TEE or CT to further evaluate aorta - Mitral valve: Moderate regurgitation. - Left atrium: The atrium was moderately dilated. - Right ventricle: The cavity size was mildly dilated. Wall thickness was normal. - Right atrium: The atrium was severely dilated. - Atrial septum: No defect or patent foramen ovale was identified. - Tricuspid valve: Moderate regurgitation. - Pulmonary arteries: PA peak pressure: 48mm Hg (S). Transthoracic echocardiography. M-mode, complete 2D, spectral Doppler, and color Doppler. Height: Height: 172.7cm. Height: 68in. Weight: Weight: 72.3kg. Weight: 159lb. Body mass index: BMI: 24.2kg/m^2. Body surface area: BSA: 1.87m^2. Blood pressure: 144/87. Patient status: Outpatient. Location: Echo laboratory.  ------------------------------------------------------------  ------------------------------------------------------------ Left ventricle: The cavity size was normal. Wall thickness was increased in a pattern of mild LVH. The estimated ejection fraction was 55%.  ------------------------------------------------------------ Aortic valve: Aortic Valve is not well seen There is mild AR.  ------------------------------------------------------------ Aorta: There is significant dilatation of the sinus of valsalva.4.8 cm There is lots of shadowing artifact in the aortic root Suggest TEE or CT to further evaluate aorta  ------------------------------------------------------------ Mitral valve: Doppler: Moderate regurgitation.  ------------------------------------------------------------ Left atrium: The atrium was moderately  dilated.  ------------------------------------------------------------ Atrial septum: No defect or patent foramen ovale was identified.  ------------------------------------------------------------ Right ventricle: The cavity size was mildly dilated. Wall thickness was normal. Systolic function was normal.  ------------------------------------------------------------ Pulmonic valve: Structurally normal valve. Cusp separation was normal. Doppler: Transvalvular velocity was within the normal range. Mild regurgitation.  ------------------------------------------------------------ Tricuspid valve: Structurally normal valve. Leaflet separation was normal. Doppler: Transvalvular velocity was within the normal range. Moderate regurgitation.  ------------------------------------------------------------ Right atrium: The atrium was severely dilated.  ------------------------------------------------------------ Pericardium: The pericardium was normal in appearance.  ------------------------------------------------------------ Post procedure conclusions Ascending Aorta:  - There is significant dilatation of the sinus of valsalva.4.8 cm There is lots of shadowing artifact in the aortic root Suggest TEE or CT to further evaluate aorta  ------------------------------------------------------------  2D measurements Normal Doppler measurements Normal Left ventricle Main pulmonary LVID ED, 42.2 mm 43-52 artery chord, Pressure, S 46 mm =30 PLAX Hg LVID ES, 38.4 mm 23-38 Aortic valve chord, Regurg PHT 714 ms ------ PLAX Mitral valve FS, chord, 9 % >29 Peak E vel 65 cm/s ------ PLAX Peak A vel 43. cm/s ------ LVPW, ED 14 mm ------ 2 IVS/LVPW 0.9 <1.3 Deceleration 141 ms 150-23 ratio, ED time 0 Ventricular septum Peak E/A 1.5 ------ IVS, ED 12.6 mm ------ ratio Aorta Tricuspid valve Root diam, 50 mm ------ Regurg peak 257 cm/s ------ ED vel Left atrium Peak RV-RA 26 mm ------ AP dim  45 mm ------ gradient, S Hg AP dim 2.41 cm/m^2 <2.2 Max regurg 257 cm/s ------ index vel Systemic veins Estimated CVP 20 mm ------ Hg Right ventricle Pressure, S 46 mm <30 Hg  ------------------------------------------------------------  Amended  Jenkins Rouge [id number]    Impression:  Patient seems to be doing remarkably well with aggressive medical therapy for chronic right-sided congestive heart failure. He looks noticeably better than he did at the time of his last office visit and dramatically better than he did during his hospitalization last December. Unfortunately, he has not seen any improvement with his underlying multiple sclerosis.   Plan:  I had a long conversation with the patient and his wife in the office today. We reviewed concerns regarding his underlying aortic pathology as well as his valvular heart disease with chronic heart failure. I encouraged him that he seems to be doing noticeably better with careful attention to heart failure management. We discussed his underlying aortic pathology with respect to gradual aneurysmal enlargement of both the aortic root and the transverse aortic arch. It has been nearly a year since his last CT angiogram it would be reasonable to consider followup exam. However, the patient seems disinclined to undergo CT angiography as he appropriately points out it will not likely change his management and there is some risk of nephrotoxicity with IV contrast.  All of his questions have been addressed. We discussed physical restrictions related to his underlying aortic pathology including the fact that we would recommend that he never consider doing any sort of heavy lifting or straining.  The patient will return in one year for followup or sooner should further problems or questions develop.  I spent in excess of 30 minutes during the conduct of this office consultation and >50% of this time involved direct face-to-face encounter  with the patient for counseling and/or coordination of their care.  Valentina Gu. Roxy Manns, MD 01/01/2014 1:30 PM

## 2014-01-04 ENCOUNTER — Ambulatory Visit (INDEPENDENT_AMBULATORY_CARE_PROVIDER_SITE_OTHER): Payer: Medicare Other | Admitting: Cardiovascular Disease

## 2014-01-04 ENCOUNTER — Encounter: Payer: Self-pay | Admitting: Cardiovascular Disease

## 2014-01-04 ENCOUNTER — Ambulatory Visit (INDEPENDENT_AMBULATORY_CARE_PROVIDER_SITE_OTHER): Payer: Medicare Other | Admitting: Pharmacist Clinician (PhC)/ Clinical Pharmacy Specialist

## 2014-01-04 VITALS — BP 121/57 | HR 85 | Ht 68.0 in | Wt 155.0 lb

## 2014-01-04 DIAGNOSIS — I4891 Unspecified atrial fibrillation: Secondary | ICD-10-CM

## 2014-01-04 DIAGNOSIS — I509 Heart failure, unspecified: Secondary | ICD-10-CM

## 2014-01-04 DIAGNOSIS — I4819 Other persistent atrial fibrillation: Secondary | ICD-10-CM

## 2014-01-04 DIAGNOSIS — I5032 Chronic diastolic (congestive) heart failure: Secondary | ICD-10-CM

## 2014-01-04 DIAGNOSIS — I079 Rheumatic tricuspid valve disease, unspecified: Secondary | ICD-10-CM

## 2014-01-04 DIAGNOSIS — Z7901 Long term (current) use of anticoagulants: Secondary | ICD-10-CM

## 2014-01-04 DIAGNOSIS — I071 Rheumatic tricuspid insufficiency: Secondary | ICD-10-CM

## 2014-01-04 DIAGNOSIS — Z5181 Encounter for therapeutic drug level monitoring: Secondary | ICD-10-CM

## 2014-01-04 LAB — POCT INR: INR: 2.4

## 2014-01-04 NOTE — Patient Instructions (Signed)
Your physician wants you to follow-up in: 1 year with Dr Berry. You will receive a reminder letter in the mail two months in advance. If you don't receive a letter, please call our office to schedule the follow-up appointment.  

## 2014-01-04 NOTE — Assessment & Plan Note (Signed)
Rate controlled on Coumadin anticoagulation followed here in our office

## 2014-01-04 NOTE — Progress Notes (Signed)
01/04/2014 [name]   May 13, 1946  [id number]  Primary Physician GREEN, [name], MD Primary Cardiologist: [name] MD [place], [place]   HPI:  [name] is a 68 y.o. male history of hemispheric repair of type A aortic dissection with resuspension of the native aortic valve in February 2000 Dr. Ricard Dillon, chronic aortic dissection extends from the descending, multiple sclerosis  visualized thoracic aorta into the right common iliac artery, dyslipidemia, hypertension, anemia. Patient's last 2-D echocardiogram was 01/17/2013 revealed an ejection fraction of 50-55%.. Wall motion was normal. Uric valve surgery with a mild central regurgitation. Aortic root was dilated ST junction. Mild to moderate mitral valve regurgitation. Left H. and was severely dilated 35.5 cm. The right ventricle cavity size mildly dilated. Right atrium was severely dilated at 32.5 cm tricuspid valve showed severe regurgitation with reversal of flow in the hepatic veins. Dilated tricuspid annulus measuring 6.16 cm. The pulmonary pressure was 44 mmHg. Patient's last nuclear stress test was in 2011 and showed no significant ischemia was considered low risk. CT of the abdomen and pelvis 4/29.14 showed chronic aortic dissection that extends into the right common iliac artery. Small amount of ascites. Large right pleural effusion. Diffuse subcutaneous edema. These findings may be on the basis of elevated right heart filling pressures supported by cardiomegaly. The patient presents for evaluation. He reports the first signs signs of dyspnea on exertion emotion or edema approximately 2 years ago but recently has gotten severely more traumatic. Reports increased lower extremity edema and dyspnea on exertion. He usually swims but as of one month ago he had to stop swimming because of increased shortness of breath and fatigue. He denies orthopnea, PND, chest pain, nausea, vomiting, fever, palpitations. He does  report dizziness when stooping over on occasion. EKG done in our office shows atrial fibrillation with controlled ventricular rate, 62 beats per minute. [name] has adjusted his diuretics which has resulted in moderate improvement in his lower gemmae edema. He remains in atrial fibrillation with a controlled ventricular response. I believe his dyspnea and edema are related to a combination of his severe TR along with his A. Fib. He is a candidate for Coumadin anticoagulation for stroke prophylaxis which has since been started and followed here in our office. He saw [name] for consideration of tricuspid valve annuloplasty but thought to be too high risk for surgical intervention. He is on low-dose diuretic with minimal peripheral edema. He does have multiple sclerosis and is minimally ambulatory and spent most of his day in the chair although he does swim every other day. He was admitted with right heart failure decompensation in the late December of last year and was diuresed. Again he was seen by [name] and [name] e in consultation with Dr. Mali Hughs  at [facility] all felt that surgical correction of this because of regurgitation was high risk and therefore medical therapy was recommended.    Current Outpatient Prescriptions  Medication Sig Dispense Refill  . AMPYRA 10 MG TB12 TAKE 1 TABLET TWICE A DAY  180 tablet  1  . ferrous sulfate 325 (65 FE) MG tablet Take 1 tablet (325 mg total) by mouth 3 (three) times daily with meals.  90 tablet  1  . folic acid (FOLVITE) 1 MG tablet Take 1 mg by mouth daily.        . furosemide (LASIX) 40 MG tablet Take one tab (40 mg) by mouth in the AM and one half (  20 mg) tab by mouth in the PM  45 tablet  3  . potassium chloride (K-DUR) 10 MEQ tablet Take 2 tablets (20 mEq total) by mouth daily.  60 tablet  3  . QUEtiapine (SEROQUEL) 25 MG tablet Take 25 mg by mouth at bedtime as needed.       . rosuvastatin (CRESTOR) 5 MG tablet Take 5 mg by mouth daily.          . TECFIDERA 120 & 240 MG MISC Take 1 capsule by mouth as directed.      . warfarin (COUMADIN) 2 MG tablet Take 1 tablet by mouth daily or as directed  90 tablet  1   No current facility-administered medications for this visit.    No Known Allergies  History   Social History  . Marital Status: Married    Spouse Name: Debbie    Number of Children: 2  . Years of Education: Ph.D   Occupational History  . 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 Smoking status: Never Smoker   . Smokeless tobacco: Never Used  . Alcohol Use: Yes     Comment: 09/11/2013 "might have a drink once/month"  . Drug Use: No  . Sexual Activity: Not Currently   Other Topics Concern  . Not on file   Social History Narrative   Patient lives at home with spouse.   Caffeine Use: eat a lot of chocolate, sodas, coffee and tea occasionally     Review of Systems: General: negative for chills, fever, night sweats or weight changes.  Cardiovascular: negative for chest pain, dyspnea on exertion, edema, orthopnea, palpitations, paroxysmal nocturnal dyspnea or shortness of breath Dermatological: negative for rash Respiratory: negative for cough or wheezing Urologic: negative for hematuria Abdominal: negative for nausea, vomiting, diarrhea, bright red blood per rectum, melena, or hematemesis Neurologic: negative for visual changes, syncope, or dizziness All other systems reviewed and are otherwise negative except as noted above.    Blood pressure 121/57, pulse 85, height 5\' 8"  (1.727 m), weight 155 lb (70.308 kg).  General appearance: alert and no distress Neck: no adenopathy, no carotid bruit, supple, symmetrical, trachea midline, thyroid not enlarged, symmetric, no tenderness/mass/nodules and iincreased jugular venous pressure Lungs: clear to auscultation bilaterally Heart: irregularly irregular rhythm Extremities: minimal lower extremity edema with compression  stockings  EKG atrial fibrillation with a controlled ventricular response and incomplete right bundle branch block  ASSESSMENT AND PLAN:   Severe tricuspid valve regurgitation Evinced primary issue is right heart failure secondary to severe TR. He has normal left jugular function by 2-D echo. Since I saw him back in August he has had admission for decompensation of her heart failure requiring diuresis. He has seen Dr. Lilly Cove  for surgical dilation as well as [name] . Consultation has been obtained with Dr. Mali Hughes at [facility] as well. The consensus opinion was that the risk ofsurgical correction of his tricuspid insufficiency was prohibitive. Therefore continue medical therapy with diuretics and compressions stockings was recommended. The patient is on 60 mg of Lasix daily. He is aware of salt restriction.Marland Kitchen He does have compression stockings  Persistent atrial fibrillation Rate controlled on Coumadin anticoagulation followed here in our office      [name] MD [facility], [facility] 01/04/2014 3:56 PM

## 2014-01-04 NOTE — Assessment & Plan Note (Addendum)
Evinced primary issue is right heart failure secondary to severe TR. He has normal left jugular function by 2-D echo. Since I saw him back in August he has had admission for decompensation of her heart failure requiring diuresis. He has seen Dr. Lilly Cove  for surgical dilation as well as [name] . Consultation has been obtained with Dr. Mali Hughes at [facility] as well. The consensus opinion was that the risk ofsurgical correction of his tricuspid insufficiency was prohibitive. Therefore continue medical therapy with diuretics and compressions stockings was recommended. The patient is on 60 mg of Lasix daily. He is aware of salt restriction.Marland Kitchen He does have compression stockings

## 2014-01-06 ENCOUNTER — Encounter: Payer: Self-pay | Admitting: Diagnostic Neuroimaging

## 2014-01-12 ENCOUNTER — Encounter: Payer: Self-pay | Admitting: Diagnostic Neuroimaging

## 2014-01-24 ENCOUNTER — Ambulatory Visit: Payer: Medicare Other | Admitting: Diagnostic Neuroimaging

## 2014-01-31 ENCOUNTER — Encounter: Payer: Self-pay | Admitting: Diagnostic Neuroimaging

## 2014-01-31 ENCOUNTER — Ambulatory Visit (INDEPENDENT_AMBULATORY_CARE_PROVIDER_SITE_OTHER): Payer: Medicare Other | Admitting: Diagnostic Neuroimaging

## 2014-01-31 VITALS — BP 158/74 | HR 78 | Temp 97.2°F | Ht 66.0 in | Wt 154.0 lb

## 2014-01-31 DIAGNOSIS — G35 Multiple sclerosis: Secondary | ICD-10-CM

## 2014-01-31 DIAGNOSIS — R269 Unspecified abnormalities of gait and mobility: Secondary | ICD-10-CM

## 2014-01-31 DIAGNOSIS — D696 Thrombocytopenia, unspecified: Secondary | ICD-10-CM

## 2014-01-31 LAB — CBC WITH DIFFERENTIAL
Basophils Absolute: 0 10*3/uL (ref 0.0–0.2)
Basos: 1 %
Eos: 4 %
Eosinophils Absolute: 0.2 10*3/uL (ref 0.0–0.4)
HCT: 38.6 % (ref 37.5–51.0)
Hemoglobin: 13.4 g/dL (ref 12.6–17.7)
Lymphocytes Absolute: 0.3 10*3/uL — ABNORMAL LOW (ref 0.7–3.1)
Lymphs: 8 %
MCH: 28 pg (ref 26.6–33.0)
MCHC: 34.7 g/dL (ref 31.5–35.7)
MCV: 81 fL (ref 79–97)
Monocytes Absolute: 0.5 10*3/uL (ref 0.1–0.9)
Monocytes: 13 %
Neutrophils Absolute: 3.1 10*3/uL (ref 1.4–7.0)
Neutrophils Relative %: 74 %
Platelets: 93 10*3/uL — CL (ref 150–379)
RBC: 4.79 x10E6/uL (ref 4.14–5.80)
RDW: 15.6 % — ABNORMAL HIGH (ref 12.3–15.4)
WBC: 4.1 10*3/uL (ref 3.4–10.8)

## 2014-01-31 NOTE — Progress Notes (Signed)
PATIENT: [name] DOB: Dec 29, 1945   REASON FOR VISIT: follow up for MS HISTORY FROM: patient and wife  HISTORY OF PRESENT ILLNESS:  UPDATE 01/31/14: Since last visit, continues on tecfidera. No progression of neuro sxs or MS sxs. Asking about driving again. Has not driven in last 3-4 months. Ongoing medical mgmt of his cardiac issues, and he is not a surgical candidate.  UPDATE 10/25/13 (LL): Patient comes in for revisit, since last visit he had worsening right-sided HF and was hospitalized.  He was seen by [name] for surgical consultation to consider tricuspid valve repair and possible Maze procedure.  He was referred to the advanced heart failure clinic who has been adjusting his lasix to manage his lower extremity edema and shortness of breath.  He is to be seen by [name] again for follow up in another month, and says [name] felt that surgery was needed.  He comes in today to discuss with [name] how his MS would be affected by having the surgery.  His wife feels like he is having more difficulty walking since last visit, but he has not had any falls.  He is tolerating Tecfidera and Ampyra well.  UPDATE 07/19/13 (VP): Patient continues to have progressive deterioration in cognitive ability, memory, mood lability, gait stability. Patient is on tecfidera now, and tolerating without side effects. Patient feels when he came off of Betaseron he had some accelerated progression of MS that this has slowed down since starting tecfidera. Unclear whether patient's current level of functioning is on the same trajectory as the decline that was noted while patient was on Betaseron, or whether there has been some acceleration of deterioration. Other factors include some marital discord and comorbid depression. Other contributing factors include his cardiac issues including atrial fibrillation, on Coumadin, as well as severe tricuspid valve regurgitation leading to peripheral edema.  Unfortunately patient is not felt to be a good surgical candidate.   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 The patient is accompanied by his wife. Pt. States he is doing worse since his last visit in Feb. He is walking slower and having more difficulty standing from a seated position. 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 [name] had talked to him about changing MS medications and he thinks he wants to. Having more memory problems. Continuing on betaseron right now.   PRIOR HPI (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 with a history of multiple sclerosis diagnosed in September 1996. He has been on Betaseron since 1996 and has right leg spasticity requiring him to use a cane in his left hand.He does not have any significant side effects from the Betaseron. Blood studies 02/10/2012 are normal CBC and CMP except for a low platelet count of 63K, Hgb 12.1 and alk Phos of 253. This is being followed by [name] and [name]. He has never had lymphopenia or neutropenia. There is no change in his symptoms. He denies bowel or bladder incontinence, weakness, Lhermitte's sign, or double vision. He swims 3 times per week for 10 laps which is a little over a quarter of a mile, claims he is winded afterwards. He has not fallen, uses a cane in his left hand. MRI of the brain and cervical spine with and without contrast enhancement 03/26/2010 showed multiple subcortical, periventricular,  and brainstem white matter hyperintensities without enhancing lesions present. There were T1 black holes and atrophy present. The cervical MRI showed a large central disc protrusion at C5-6 and spinal cord hyperintensities at C4 and brain stem representing remote demyelinating plaques without enhancing lesions present. Ampyra was started November 2012  with improvement in gait. He was pleased with the addition of the drug and his response. Pt denies injection site problems however he says he is tired of shots. He has been on [employer] since 1996 and we have talked about whether changing to an oral agent should be considered. He had several falls with his right leg giving way on standing. He has right knee pain. He states his memory is worse. He denies numbness delusions or depression. 07/11/2012= (MMSE29/30. Clock drawing task4/4. Animal fluency test 17). His wife has noted more problems walking which the patient is hesitant to discuss. He does admit since calling me for a work in appointment , that he believes he is having more difficulty walking. He uses a walker at home and is noticeably slower. He uses the walker to get out of a bed that is low and out of a chair. He notices shortness of breath on swimming. His swallowing is okay except for occasional difficulty with liquids. He has right knee pain when walking. Betaseron seems to help his knee pain, walking, and his eyes after an injection. He is hesitant to go off of the medication for 3 weeks. He has increased spasms in his right foot and leg that are also moving to the left leg. He has swelling in his legs treated with furosemide.   REVIEW OF SYSTEMS: Full 14 system review of systems performed and notable only for leg swelling murmur restless legs.  ALLERGIES: No Known Allergies  HOME MEDICATIONS: Outpatient Prescriptions Prior to Visit  Medication Sig Dispense Refill  . AMPYRA 10 MG TB12 TAKE 1 TABLET TWICE A DAY  180 tablet  1  . ferrous sulfate 325 (65 FE) MG tablet Take 1 tablet (325 mg total) by mouth 3 (three) times daily with meals.  90 tablet  1  . folic acid (FOLVITE) 1 MG tablet Take 1 mg by mouth daily.        . furosemide (LASIX) 40 MG tablet Take one tab (40 mg) by mouth in the AM and one half (20 mg) tab by mouth in the PM  45 tablet  3  . potassium chloride (K-DUR) 10 MEQ  tablet Take 2 tablets (20 mEq total) by mouth daily.  60 tablet  3  . QUEtiapine (SEROQUEL) 25 MG tablet Take 25 mg by mouth at bedtime as needed.       . rosuvastatin (CRESTOR) 5 MG tablet Take 5 mg by mouth daily.        . TECFIDERA 120 & 240 MG MISC Take 1 capsule by mouth as directed.      . warfarin (COUMADIN) 2 MG tablet Take 1 tablet by mouth daily or as directed  90 tablet  1   No facility-administered medications prior to visit.    PAST MEDICAL HISTORY: Past Medical History  Diagnosis Date  . Neuromuscular disorder   . Hypertension   . Anemia   . Depression   . Multiple sclerosis   . Thrombocytopenia   . Dyslipidemia   . Aortic dissection, thoracic   . BPH (benign prostatic hyperplasia)   . Hemorrhoids   . Aortic dissection 11/05/1998    S/P emergency repair of acute type  A aortic dissection with resuspension of native aortic valve  . 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 CHF (congestive heart failure) 03-04-1946    2D Echo - EF 50-55%, mild-moderately dilated right ventricle, mild-moderate tricuspid valve regurgitation, moderately dilated right atrium  . Bilateral lower extremity edema   . Atrial fibrillation 01/30/2013    Atrial fibrillation   . Pleural effusion, right, large 01/30/2013  . Severe tricuspid valve regurgitation 01/30/2013    Severe tricuspid regurg by recent 2-D echo with a dilated tricuspid annulus, moderate pulmonary hypertension and biatrial enlargement   . Mitral regurgitation 02/20/2013  . Exertional shortness of breath     "for awhile now" (09/11/2013)  . History of blood transfusion     PAST SURGICAL HISTORY: Past Surgical History  Procedure Laterality Date  . Hemorrhoid surgery  2009    Internal, external hemorrhoidectomy, general anesthesia,prone position. [Other]  . Open reduction and internal fixation of right  distal radius fracture using hand innovations distal radius volar locking plate.  07/2005    Dr Ninfa Linden  . Tee without cardioversion N/A 02/17/2013    Procedure: TRANSESOPHAGEAL ECHOCARDIOGRAM (TEE);  Surgeon: [name], MD;  Location[facility] ENDOSCOPY;  Service: Cardiovascular;  Laterality: N/A;  . Repair of acute type a aortic dissection with resuspension of native aortic valve  10/15/1998    Dr Roxy Manns    FAMILY HISTORY: Family History  Problem Relation Age of Onset  . Thyroid cancer Father     smoked  . Lung cancer Brother     smoked  . Heart attack Mother   . Emphysema Brother     smoked    SOCIAL HISTORY: History   Social History  . Marital Status: Married    Spouse Name: Debbie    Number of Children: 2  . Years of Education: Ph.D   Occupational History  . 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 Smoking status: Never Smoker   . Smokeless tobacco: Never Used  . Alcohol Use: Yes     Comment: 09/11/2013 "might have a drink once/month"  . Drug Use: No  . Sexual Activity: Not Currently   Other Topics Concern  . Not on file   Social History Narrative   Patient lives at home with spouse.   Caffeine Use: eat a lot of chocolate, sodas, coffee and tea occasionally     PHYSICAL EXAM  Filed Vitals:   01/31/14 1341  BP: 158/74  Pulse: 78  Temp: 97.2 F (36.2 C)  TempSrc: Oral  Height: '5\' 6"'  (1.676 m)  Weight: 154 lb (69.854 kg)   Body mass index is 24.87 kg/(m^2).  Generalized: Well developed, in no acute distress  Head: normocephalic and atraumatic. Oropharynx benign  Neck: Supple, no carotid bruits  Cardiac: Regular rate rhythm, no murmur  Musculoskeletal: No deformity   Neurological examination  MENTAL STATUS: awake, alert, language fluent, comprehension intact, naming intact  CRANIAL NERVE: no papilledema on fundoscopic exam, pupils equal and reactive to light, visual fields full to confrontation, extraocular  muscles: SACCADIC BREAKDOWN OF SMOOTH PURSUIT; END GAZE NYSTAGMUS; PAST POINTING ON SACCADES. Facial sensation and WITH DECR RIGHT EYE CLOSURE WEAKNESS AND RIGHT LOWER FACIAL STRENGTH. Uvula midline, shoulder shrug symmetric, tongue midline.  MOTOR: INCREASED TONE IN BLE > BUE (RIGHT > LEFT). BUE (DELTOID 4+, TRICEP 4,  BICEP 5, GRIP 4+), RIGHT LEG (HF 3, KE 4, KF 3, DF 3), LEFT LEG (HF 4, KE/KF 4+, DF 4+) SENSORY: DECR IN RLE TO ALL MODALITIES.  COORDINATION: finger-nose-finger, fine finger movements normal  REFLEXES: BUE 3 (R>L), RLE 3, LLE 2.  GAIT/STATION: DIFF STANDING FROM CHAIR. USES A CANE. SPASTIC GAIT. RIGHT CIRCUMDUCTION. UNSTEADY.    DIAGNOSTIC DATA (LABS, IMAGING, TESTING) - I reviewed patient records, labs, notes, testing and imaging myself where available.  Lab Results  Component Value Date   WBC 5.6 09/18/2013   HGB 8.7* 09/18/2013   HCT 27.6* 09/18/2013   MCV 87.1 09/18/2013   PLT 150 09/18/2013      Component Value Date/Time   NA 147 09/18/2013 0554   K 3.9 09/18/2013 0554   CL 111 09/18/2013 0554   CO2 26 09/18/2013 0554   GLUCOSE 84 09/18/2013 0554   BUN 34* 09/18/2013 0554   CREATININE 1.47* 09/18/2013 0554   CALCIUM 8.9 09/18/2013 0554   PROT 6.6 09/12/2013 0900   ALBUMIN 3.2* 09/12/2013 0900   AST 15 09/12/2013 0900   ALT 17 09/12/2013 0900   ALKPHOS 306* 09/12/2013 0900   BILITOT 0.8 09/12/2013 0900   GFRNONAA 48* 09/18/2013 0554   GFRAA 56* 09/18/2013 0554    11/16/12 MRI brain - There are multiple periventricular and subcortical and pontine chronic demyelinating plaques. There are several T1 black holes. Mild diffuse atrophy. No acute plaques. In comparison to MRI from 03/26/10, there is no significant change.   08/02/13 MRI brain - multiple brainstem, periventricular, corpus callosal and subcortical white matter hyperintensities compatible with chronic lesions of multiple sclerosis. No enhancing lesions are noted. The presence of T1 black holes and significant atrophy of corpus  callosum and cortex indicates chronic disease.  11/16/12 MRI cervical spine - Multiple chronic demyelinating plaques at C2-3, C3-4 and C5-6. No acute plaques. At C5-6: Disc bulging with facet hypertrophy with moderate spinal stenosis, severe right and moderate left foraminal stenosis. At C3-4, C4-5: Disc bulging with mild spinal stenosis and biforaminal stenosis. In comparison to MRI from 03/26/10 there is no significant change.   01/20/13 JCV antibody - positive, index 3.37 (H)    ASSESSMENT AND PLAN  68 y.o. old male with Hypertension; Anemia; Depression; Multiple sclerosis; Thrombocytopenia; Dyslipidemia; Aortic dissection, thoracic; Aortic dissection (11/05/1998); Aneurysm of aortic arch (04/04/2012); Chest pain (11/18/2009); CHF (congestive heart failure), Atrial fibrillation (01/30/2013); Pleural effusion, right, large (01/30/2013); Severe tricuspid valve regurgitation (01/30/2013); Mitral regurgitation (02/20/2013); and Lower extremity edema (02/20/2013) here with multiple sclerosis since 1996.   Tolerating tecfidera, but continues to have progressive cognitive, emotional and physical decline. May be related to secondary progressive MS, depression and marital discord. This is a very difficult situation.    PLAN: 1. Continue Tecfidera and Ampyra. 2. Follow up in 6 months with [name], sooner as needed. 3. I do not feel that he is safe to drive a car due to his multiple sclerosis and neurologic deficits.  Orders Placed This Encounter  Procedures  . CBC With differential/Platelet  . Ambulatory referral to Physical Therapy   Return in about 6 months (around 08/03/2014).    Penni Bombard, MD 5/40/0867, 6:19 PM Certified in Neurology, Neurophysiology and Neuroimaging  [facility] [address] [place], [place] [address] [phone]

## 2014-01-31 NOTE — Patient Instructions (Signed)
Continue tecfidera.

## 2014-02-02 ENCOUNTER — Ambulatory Visit (INDEPENDENT_AMBULATORY_CARE_PROVIDER_SITE_OTHER): Payer: Medicare Other | Admitting: Pharmacist Clinician (PhC)/ Clinical Pharmacy Specialist

## 2014-02-02 DIAGNOSIS — I4819 Other persistent atrial fibrillation: Secondary | ICD-10-CM

## 2014-02-02 DIAGNOSIS — Z7901 Long term (current) use of anticoagulants: Secondary | ICD-10-CM

## 2014-02-02 DIAGNOSIS — I4891 Unspecified atrial fibrillation: Secondary | ICD-10-CM

## 2014-02-02 LAB — POCT INR: INR: 2.5

## 2014-02-11 ENCOUNTER — Encounter: Payer: Self-pay | Admitting: Thoracic Surgery (Cardiothoracic Vascular Surgery)

## 2014-02-14 ENCOUNTER — Other Ambulatory Visit: Payer: Self-pay | Admitting: Pharmacist Clinician (PhC)/ Clinical Pharmacy Specialist

## 2014-02-23 NOTE — Addendum Note (Signed)
Addended byAndrey Spearman on: 02/23/2014 03:00 PM   Modules accepted: Orders

## 2014-02-26 ENCOUNTER — Other Ambulatory Visit: Payer: Self-pay

## 2014-02-26 DIAGNOSIS — I7101 Dissection of thoracic aorta: Secondary | ICD-10-CM

## 2014-02-26 DIAGNOSIS — I71019 Dissection of thoracic aorta, unspecified: Secondary | ICD-10-CM

## 2014-02-26 DIAGNOSIS — I712 Thoracic aortic aneurysm, without rupture, unspecified: Secondary | ICD-10-CM

## 2014-02-27 NOTE — Progress Notes (Signed)
Quick Note:  I called pt and relayed the results of platelet count. [name] recommended doing a repeat. I relayed to pt and he will come in. He stated that Dr. Earlie Server had seen him for low platelet count around 2 yrs ago and it had gone down to 50's and nothing was done. I reiterated Dr. Gladstone Lighter recommendation for repeat CBC w/diff. ______

## 2014-02-28 ENCOUNTER — Other Ambulatory Visit: Payer: Self-pay | Admitting: Internal Medicine

## 2014-03-02 ENCOUNTER — Ambulatory Visit (INDEPENDENT_AMBULATORY_CARE_PROVIDER_SITE_OTHER): Payer: Medicare Other | Admitting: Pharmacist Clinician (PhC)/ Clinical Pharmacy Specialist

## 2014-03-02 DIAGNOSIS — I4891 Unspecified atrial fibrillation: Secondary | ICD-10-CM

## 2014-03-02 DIAGNOSIS — I4819 Other persistent atrial fibrillation: Secondary | ICD-10-CM

## 2014-03-02 DIAGNOSIS — Z7901 Long term (current) use of anticoagulants: Secondary | ICD-10-CM

## 2014-03-02 LAB — POCT INR: INR: 1.7

## 2014-03-14 ENCOUNTER — Encounter (HOSPITAL_COMMUNITY): Payer: BC Managed Care – PPO

## 2014-03-19 ENCOUNTER — Ambulatory Visit: Payer: Medicare Other | Admitting: Pharmacist Clinician (PhC)/ Clinical Pharmacy Specialist

## 2014-03-19 ENCOUNTER — Other Ambulatory Visit: Payer: Self-pay

## 2014-03-19 MED ORDER — DIMETHYL FUMARATE 240 MG PO CPDR: 240.0000 mg | DELAYED_RELEASE_CAPSULE | Freq: Two times a day (BID) | ORAL | Status: DC

## 2014-03-21 ENCOUNTER — Other Ambulatory Visit: Payer: Self-pay | Admitting: Diagnostic Neuroimaging

## 2014-03-21 ENCOUNTER — Ambulatory Visit (INDEPENDENT_AMBULATORY_CARE_PROVIDER_SITE_OTHER): Payer: Medicare Other | Admitting: Pharmacist

## 2014-03-21 DIAGNOSIS — Z7901 Long term (current) use of anticoagulants: Secondary | ICD-10-CM

## 2014-03-21 DIAGNOSIS — I4819 Other persistent atrial fibrillation: Secondary | ICD-10-CM

## 2014-03-21 DIAGNOSIS — I4891 Unspecified atrial fibrillation: Secondary | ICD-10-CM

## 2014-03-21 LAB — POCT INR: INR: 1.4

## 2014-03-21 MED ORDER — DIMETHYL FUMARATE 240 MG PO CPDR: 240.0000 mg | DELAYED_RELEASE_CAPSULE | Freq: Two times a day (BID) | ORAL | Status: DC

## 2014-03-25 ENCOUNTER — Telehealth: Payer: Self-pay

## 2014-03-25 NOTE — Telephone Encounter (Signed)
[employer] sent [place] a letter saying they have approved our request for coverage on Tecfidera effective until 03/20/2015 Ref # [id number]

## 2014-03-29 ENCOUNTER — Ambulatory Visit (HOSPITAL_COMMUNITY)
Admission: RE | Admit: 2014-03-29 | Discharge: 2014-03-29 | Disposition: A | Discharge status: Home / Self Care | Payer: Medicare Other | Source: Ambulatory Visit | Attending: Cardiology | Admitting: Cardiology

## 2014-03-29 VITALS — BP 144/68 | HR 93 | Wt 152.0 lb

## 2014-03-29 DIAGNOSIS — Z09 Encounter for follow-up examination after completed treatment for conditions other than malignant neoplasm: Secondary | ICD-10-CM | POA: Insufficient documentation

## 2014-03-29 DIAGNOSIS — I059 Rheumatic mitral valve disease, unspecified: Secondary | ICD-10-CM | POA: Insufficient documentation

## 2014-03-29 DIAGNOSIS — Z79899 Other long term (current) drug therapy: Secondary | ICD-10-CM | POA: Insufficient documentation

## 2014-03-29 DIAGNOSIS — I7100 Dissection of unspecified site of aorta: Secondary | ICD-10-CM | POA: Diagnosis not present

## 2014-03-29 DIAGNOSIS — G35 Multiple sclerosis: Secondary | ICD-10-CM | POA: Diagnosis not present

## 2014-03-29 DIAGNOSIS — I509 Heart failure, unspecified: Secondary | ICD-10-CM

## 2014-03-29 DIAGNOSIS — G35D Multiple sclerosis, unspecified: Secondary | ICD-10-CM

## 2014-03-29 DIAGNOSIS — I079 Rheumatic tricuspid valve disease, unspecified: Secondary | ICD-10-CM | POA: Diagnosis not present

## 2014-03-29 DIAGNOSIS — I4819 Other persistent atrial fibrillation: Secondary | ICD-10-CM

## 2014-03-29 DIAGNOSIS — I7101 Dissection of thoracic aorta: Secondary | ICD-10-CM

## 2014-03-29 DIAGNOSIS — I4891 Unspecified atrial fibrillation: Secondary | ICD-10-CM | POA: Diagnosis not present

## 2014-03-29 DIAGNOSIS — Z7901 Long term (current) use of anticoagulants: Secondary | ICD-10-CM | POA: Insufficient documentation

## 2014-03-29 DIAGNOSIS — I5032 Chronic diastolic (congestive) heart failure: Secondary | ICD-10-CM

## 2014-03-29 DIAGNOSIS — I71019 Dissection of thoracic aorta, unspecified: Secondary | ICD-10-CM

## 2014-03-29 DIAGNOSIS — I071 Rheumatic tricuspid insufficiency: Secondary | ICD-10-CM

## 2014-03-29 DIAGNOSIS — I71011 Dissection of aortic arch: Secondary | ICD-10-CM

## 2014-03-29 LAB — BASIC METABOLIC PANEL
Anion gap: 13 (ref 5–15)
BUN: 29 mg/dL — ABNORMAL HIGH (ref 6–23)
CO2: 29 mEq/L (ref 19–32)
Calcium: 9.4 mg/dL (ref 8.4–10.5)
Chloride: 103 mEq/L (ref 96–112)
Creatinine, Ser: 1.12 mg/dL (ref 0.50–1.35)
GFR calc Af Amer: 77 mL/min — ABNORMAL LOW (ref >90)
GFR calc non Af Amer: 67 mL/min — ABNORMAL LOW (ref >90)
Glucose, Bld: 69 mg/dL — ABNORMAL LOW (ref 70–99)
Potassium: 4.1 mEq/L (ref 3.7–5.3)
Sodium: 145 mEq/L (ref 137–147)

## 2014-03-29 LAB — PRO B NATRIURETIC PEPTIDE: Pro B Natriuretic peptide (BNP): 3070 pg/mL — ABNORMAL HIGH (ref 0–125)

## 2014-03-29 NOTE — Patient Instructions (Signed)
Labs today  We will contact you in 3 months to schedule your next appointment.  

## 2014-04-02 ENCOUNTER — Other Ambulatory Visit: Payer: Medicare Other

## 2014-04-02 ENCOUNTER — Ambulatory Visit: Payer: BC Managed Care – PPO | Admitting: Thoracic Surgery (Cardiothoracic Vascular Surgery)

## 2014-04-02 NOTE — Progress Notes (Signed)
Patient ID: [name], male   DOB: [birth year], 68 y.o.   MRN: [id number] Referring MD : Dr Roxy Manns Cardiologist: Dr [name]  PCP: Dr [name]   HPI: Mr Sicotte is a 68 year old retired Engineer, production professor with a history of advanced multiple sclerosis, Type A aortic dissection 2000, right heart failure with severe TR and chronic AF referred by [name] for further evaluation for possible TV repair.   He presented acutely on October 15, 1998 with acute type A aortic dissection. He underwent emergency surgical repair with hemi-arch replacement of the ascending thoracic aorta and resuspension of the native aortic valve. 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 CT angiograms of the chest, abdomen and pelvis have remained stable with mild gradual enlargement of the transverse aortic arch and the non-coronary sinus of valsalva. By report, most recent transverse arch dimension has been > 5.5cm. I do not have the scan available to review.  In 2011 developed RHF. TEE in 6/14 reviewed. He has low normal LV function with markedly dilated RA/RV and severe TR. RV function relatively well preserved. Mild to moderate MR. Repeat transthoracic echocardiogram demonstrated stable left ventricular systolic function with ejection fraction estimated 50-55%. There was reportedly mild to moderate mitral regurgitation with left and right atrial chamber enlargement. There was severe tricuspid regurgitation with flow reversal in the hepatic veins. Pulmonary artery pressures were estimated 44 mm mercury.  Admitted in December 2014 with right heart failure including severe bilateral lower extremity edema and generalized anasarca. Diuresed well and was started on Coumadin for long-term anticoagulation because of chronic persistent atrial fibrillation.   After his visit to CHF clinic, Dr  Haroldine Laws discussed his case with [name] who also discussed the situation with Dr. Mali Hughes at [facility] and they felt that heart surgery would be prohibitive due to chronic aortic dissection. Since that time he has also seen Dr. Jae Dire who felt surgery would be high risk due to MS.  Echo in 3/15 actually showed that tricuspid regurgitation looked more moderate; EF 55%, mild AI, aortic root at sinuses of valsalva 4.8 cm, moderate MR, mildly dilated RV moderate TR, PA systolic pressure 46 mmHg.   Follow-up: Continues on lasix 40 mg qam 20 qpm daily. Weighing every day. Weight staying around 150 at home. No significant exertional dyspnea at this point.  No chest pain.  He is not getting further chest CTs as he would not have another cardiothoracic surgery.   Labs (4/15): K 3.7, creatinine 1.07   PHYSICAL EXAM: Filed Vitals:   03/29/14 1408  BP: 144/68  Pulse: 93   General:  Walks with cane No respiratory difficulty HEENT: normal Neck: supple. JVP not elevated. Carotids 2+ bilat; no bruits. No lymphadenopathy or thryomegaly appreciated. Cor: PMI nondisplaced. Irregular rate & rhythm. 2/6 HSM LLSB.  Lungs: clear.  Abdomen: soft, nontender, Mildly distended. No hepatosplenomegaly. No bruits or masses. Good bowel sounds. Extremities: no cyanosis, clubbing, rash, Trace ankle edema.  Neuro: alert & oriented x 3, cranial nerves grossly intact. moves all 4 extremities w/o difficulty. Affect pleasant.  ECHO 11/28/13  - Left ventricle: The cavity size was normal. Wall thickness was increased in a pattern of mild LVH. The estimated ejection fraction was 55%. - Aortic valve: Aortic Valve is not well seen There is mild AR. - Aorta: There is significant dilatation of the sinus of valsalva.4.8cm There is  lots of shadowing artifact in the aortic root Suggest TEE or CT to further evaluate aorta - Mitral valve: Moderate regurgitation. - Left atrium: The atrium was moderately dilated. - Right  ventricle: The cavity size was mildly dilated. Wall thickness was normal. - Right atrium: The atrium was severely dilated. - Atrial septum: No defect or patent foramen ovale was identified. - Tricuspid valve: Moderate regurgitation. - Pulmonary arteries: PA peak pressure: 40mm Hg (S).   ASSESSMENT & PLAN: 1. Right heart failure with severe TR (though less marked on most recent echo in 3/15): Volume status looks stable today, NYHA class II symptoms.  Continue current Lasix dosing. He is not a candidate for a tricuspid valve operation.  Focus on symptoms control/volume management. BMET/BNP today.  2. Aortic dissection:     --s/p hemi-arch replacement of the ascending thoracic aorta and resuspension of the native aortic valve 2000    --now with chronic dissection and enlarging aneurysm involving sinus of Valsalva and aortic arch    --CTA chest not done this year as he would not have redo surgery.  3. MR: moderate on 3/15 echo.  4. Chronic AF: On warfarin.  5. Severe multiple sclerosis  Followup in 3 months   Keviana Guida,MD 04/02/2014

## 2014-04-04 ENCOUNTER — Ambulatory Visit (INDEPENDENT_AMBULATORY_CARE_PROVIDER_SITE_OTHER): Payer: Medicare Other | Admitting: Pharmacist Clinician (PhC)/ Clinical Pharmacy Specialist

## 2014-04-04 DIAGNOSIS — I4891 Unspecified atrial fibrillation: Secondary | ICD-10-CM

## 2014-04-04 DIAGNOSIS — Z7901 Long term (current) use of anticoagulants: Secondary | ICD-10-CM

## 2014-04-04 DIAGNOSIS — I4819 Other persistent atrial fibrillation: Secondary | ICD-10-CM

## 2014-04-04 LAB — POCT INR: INR: 2

## 2014-04-09 ENCOUNTER — Other Ambulatory Visit (HOSPITAL_COMMUNITY): Payer: Self-pay

## 2014-04-09 DIAGNOSIS — I5032 Chronic diastolic (congestive) heart failure: Secondary | ICD-10-CM

## 2014-04-09 MED ORDER — FUROSEMIDE 40 MG PO TABS: ORAL_TABLET | ORAL | Status: DC

## 2014-05-02 ENCOUNTER — Ambulatory Visit (INDEPENDENT_AMBULATORY_CARE_PROVIDER_SITE_OTHER): Payer: Medicare Other | Admitting: Pharmacist Clinician (PhC)/ Clinical Pharmacy Specialist

## 2014-05-02 DIAGNOSIS — I4891 Unspecified atrial fibrillation: Secondary | ICD-10-CM

## 2014-05-02 DIAGNOSIS — Z7901 Long term (current) use of anticoagulants: Secondary | ICD-10-CM

## 2014-05-02 DIAGNOSIS — I4819 Other persistent atrial fibrillation: Secondary | ICD-10-CM

## 2014-05-02 LAB — POCT INR: INR: 2.2

## 2014-05-06 ENCOUNTER — Other Ambulatory Visit (HOSPITAL_COMMUNITY): Payer: Self-pay | Admitting: Internal Medicine

## 2014-05-30 ENCOUNTER — Ambulatory Visit (INDEPENDENT_AMBULATORY_CARE_PROVIDER_SITE_OTHER): Payer: Medicare Other | Admitting: Pharmacist Clinician (PhC)/ Clinical Pharmacy Specialist

## 2014-05-30 DIAGNOSIS — Z7901 Long term (current) use of anticoagulants: Secondary | ICD-10-CM

## 2014-05-30 DIAGNOSIS — I4819 Other persistent atrial fibrillation: Secondary | ICD-10-CM

## 2014-05-30 DIAGNOSIS — I4891 Unspecified atrial fibrillation: Secondary | ICD-10-CM

## 2014-05-30 LAB — POCT INR: INR: 1.7

## 2014-06-22 ENCOUNTER — Ambulatory Visit (INDEPENDENT_AMBULATORY_CARE_PROVIDER_SITE_OTHER): Payer: Medicare Other | Admitting: Pharmacist Clinician (PhC)/ Clinical Pharmacy Specialist

## 2014-06-22 DIAGNOSIS — I4819 Other persistent atrial fibrillation: Secondary | ICD-10-CM

## 2014-06-22 DIAGNOSIS — I481 Persistent atrial fibrillation: Secondary | ICD-10-CM

## 2014-06-22 DIAGNOSIS — Z7901 Long term (current) use of anticoagulants: Secondary | ICD-10-CM

## 2014-06-22 DIAGNOSIS — I4891 Unspecified atrial fibrillation: Secondary | ICD-10-CM

## 2014-06-22 LAB — POCT INR: INR: 2.6

## 2014-06-29 ENCOUNTER — Other Ambulatory Visit: Payer: Self-pay

## 2014-07-15 ENCOUNTER — Other Ambulatory Visit: Payer: Self-pay

## 2014-07-15 DIAGNOSIS — I5032 Chronic diastolic (congestive) heart failure: Secondary | ICD-10-CM

## 2014-07-15 MED ORDER — POTASSIUM CHLORIDE ER 10 MEQ PO TBCR: 20.0000 meq | EXTENDED_RELEASE_TABLET | Freq: Every day | ORAL | Status: DC

## 2014-07-15 MED ORDER — FUROSEMIDE 40 MG PO TABS: ORAL_TABLET | ORAL | Status: DC

## 2014-07-20 ENCOUNTER — Ambulatory Visit (INDEPENDENT_AMBULATORY_CARE_PROVIDER_SITE_OTHER): Payer: Medicare Other | Admitting: Pharmacist Clinician (PhC)/ Clinical Pharmacy Specialist

## 2014-07-20 DIAGNOSIS — I4891 Unspecified atrial fibrillation: Secondary | ICD-10-CM

## 2014-07-20 DIAGNOSIS — Z7901 Long term (current) use of anticoagulants: Secondary | ICD-10-CM

## 2014-07-20 DIAGNOSIS — I481 Persistent atrial fibrillation: Secondary | ICD-10-CM

## 2014-07-20 DIAGNOSIS — I4819 Other persistent atrial fibrillation: Secondary | ICD-10-CM

## 2014-07-20 LAB — POCT INR: INR: 3.3

## 2014-07-23 ENCOUNTER — Other Ambulatory Visit: Payer: Self-pay | Admitting: Pharmacist Clinician (PhC)/ Clinical Pharmacy Specialist

## 2014-07-23 MED ORDER — WARFARIN SODIUM 2 MG PO TABS: ORAL_TABLET | ORAL | Status: DC

## 2014-08-02 ENCOUNTER — Encounter: Payer: Self-pay | Admitting: Diagnostic Neuroimaging

## 2014-08-02 ENCOUNTER — Ambulatory Visit (INDEPENDENT_AMBULATORY_CARE_PROVIDER_SITE_OTHER): Payer: Medicare Other | Admitting: Diagnostic Neuroimaging

## 2014-08-02 VITALS — BP 130/63 | HR 88 | Ht 68.0 in | Wt 163.6 lb

## 2014-08-02 DIAGNOSIS — G35 Multiple sclerosis: Secondary | ICD-10-CM

## 2014-08-02 DIAGNOSIS — R269 Unspecified abnormalities of gait and mobility: Secondary | ICD-10-CM

## 2014-08-02 DIAGNOSIS — D696 Thrombocytopenia, unspecified: Secondary | ICD-10-CM

## 2014-08-02 NOTE — Progress Notes (Signed)
PATIENT: [name] DOB: 1946-03-21   REASON FOR VISIT: follow up for MS HISTORY FROM: patient and wife  Chief Complaint  Patient presents with  . Follow-up     HISTORY OF PRESENT ILLNESS:  UPDATE 08/02/14: Since last visit, feels stable. Tolerating tecfidera and ampyra. No new events. Uses cane and walker. Swimming more. Wife notes memory issues slightly worse.   UPDATE 01/31/14: Since last visit, continues on tecfidera. No progression of neuro sxs or MS sxs. Asking about driving again. Has not driven in last 3-4 months. Ongoing medical mgmt of his cardiac issues, and he is not a surgical candidate.  UPDATE 10/25/13 (LL): Patient comes in for revisit, since last visit he had worsening right-sided HF and was hospitalized.  He was seen by [name] for surgical consultation to consider tricuspid valve repair and possible Maze procedure.  He was referred to the advanced heart failure clinic who has been adjusting his lasix to manage his lower extremity edema and shortness of breath.  He is to be seen by [name] again for follow up in another month, and says [name] felt that surgery was needed.  He comes in today to discuss with [name] how his MS would be affected by having the surgery.  His wife feels like he is having more difficulty walking since last visit, but he has not had any falls.  He is tolerating Tecfidera and Ampyra well.  UPDATE 07/19/13 (VP): Patient continues to have progressive deterioration in cognitive ability, memory, mood lability, gait stability. Patient is on tecfidera now, and tolerating without side effects. Patient feels when he came off of Betaseron he had some accelerated progression of MS that this has slowed down since starting tecfidera. Unclear whether patient's current level of functioning is on the same trajectory as the decline that was noted while patient was on Betaseron, or whether there has been some acceleration of deterioration. Other  factors include some marital discord and comorbid depression. Other contributing factors include his cardiac issues including atrial fibrillation, on Coumadin, as well as severe tricuspid valve regurgitation leading to peripheral edema. Unfortunately patient is not felt to be a good surgical candidate.   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 The patient is accompanied by his wife. Pt. States he is doing worse since his last visit in Feb. He is walking slower and having more difficulty standing from a seated position. 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 [name] had talked to him about changing MS medications and he thinks he wants to. Having more memory problems. Continuing on betaseron right now.   PRIOR HPI (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 with a history of multiple sclerosis diagnosed in September 1996. He has been on Betaseron since 1996 and has right leg spasticity requiring him to use a cane in his left hand.He does not have any significant side effects from the Betaseron. Blood studies 02/10/2012 are normal CBC and CMP except for a low platelet count of 63K, Hgb 12.1 and alk Phos of 253. This is being followed by [name] and [name]. He has never had lymphopenia or neutropenia. There is no change in his symptoms. He denies bowel or bladder incontinence, weakness, Lhermitte's sign, or double vision. He swims 3 times per week for 10 laps which is  a little over a quarter of a mile, claims he is winded afterwards. He has not fallen, uses a cane in his left hand. MRI of the brain and cervical spine with and without contrast enhancement 03/26/2010 showed multiple subcortical, periventricular, and brainstem white matter hyperintensities without enhancing lesions present. There were T1 black holes and atrophy  present. The cervical MRI showed a large central disc protrusion at C5-6 and spinal cord hyperintensities at C4 and brain stem representing remote demyelinating plaques without enhancing lesions present. Ampyra was started November 2012 with improvement in gait. He was pleased with the addition of the drug and his response. Pt denies injection site problems however he says he is tired of shots. He has been on [employer] since 1996 and we have talked about whether changing to an oral agent should be considered. He had several falls with his right leg giving way on standing. He has right knee pain. He states his memory is worse. He denies numbness delusions or depression. 07/11/2012= (MMSE29/30. Clock drawing task4/4. Animal fluency test 17). His wife has noted more problems walking which the patient is hesitant to discuss. He does admit since calling me for a work in appointment , that he believes he is having more difficulty walking. He uses a walker at home and is noticeably slower. He uses the walker to get out of a bed that is low and out of a chair. He notices shortness of breath on swimming. His swallowing is okay except for occasional difficulty with liquids. He has right knee pain when walking. Betaseron seems to help his knee pain, walking, and his eyes after an injection. He is hesitant to go off of the medication for 3 weeks. He has increased spasms in his right foot and leg that are also moving to the left leg. He has swelling in his legs treated with furosemide.   REVIEW OF SYSTEMS: Full 14 system review of systems performed and notable only for leg swelling murmur restless legs black stools and memory loss.  ALLERGIES: No Known Allergies  HOME MEDICATIONS: Outpatient Prescriptions Prior to Visit  Medication Sig Dispense Refill  . AMPYRA 10 MG TB12 TAKE 1 TABLET TWICE A DAY 180 tablet 1  . Dimethyl Fumarate (TECFIDERA) 240 MG CPDR Take 1 capsule (240 mg total) by mouth 2 (two) times daily. 180  capsule 1  . ferrous sulfate 325 (65 FE) MG tablet Take 1 tablet (325 mg total) by mouth 3 (three) times daily with meals. 90 tablet 1  . folic acid (FOLVITE) 1 MG tablet Take 1 mg by mouth daily.      . furosemide (LASIX) 40 MG tablet Take one tab (40 mg) by mouth in the AM and one half (20 mg) tab by mouth in the PM 135 tablet 0  . NON FORMULARY Take 2 tablets by mouth daily. OTC Laxative    . QUEtiapine (SEROQUEL) 25 MG tablet Take 25 mg by mouth at bedtime as needed.     . rosuvastatin (CRESTOR) 5 MG tablet Take 5 mg by mouth daily.      Marland Kitchen senna-docusate (SENOKOT-S) 8.6-50 MG per tablet Take 2 tablets by mouth daily.    Marland Kitchen warfarin (COUMADIN) 2 MG tablet Take 1 to 1.5 tablets by mouth daily as directed by coumadin clinic 120 tablet 1  . potassium chloride (K-DUR) 10 MEQ tablet Take 2 tablets (20 mEq total) by mouth daily. 60 tablet 3   No facility-administered medications prior to visit.    PAST MEDICAL  HISTORY: Past Medical History  Diagnosis Date  . Neuromuscular disorder   . Hypertension   . Anemia   . Depression   . Multiple sclerosis   . Thrombocytopenia   . Dyslipidemia   . Aortic dissection, thoracic   . BPH (benign prostatic hyperplasia)   . Hemorrhoids   . Aortic dissection 11/05/1998    S/P emergency repair of acute type A aortic dissection with resuspension of native aortic valve  . 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 CHF (congestive heart failure) 03/05/1947    2D Echo - EF 50-55%, mild-moderately dilated right ventricle, mild-moderate tricuspid valve regurgitation, moderately dilated right atrium  . Bilateral lower extremity edema   . Atrial fibrillation 01/30/2013    Atrial fibrillation   . Pleural effusion, right, large 01/30/2013  . Severe tricuspid valve regurgitation 01/30/2013    Severe tricuspid regurg by recent 2-D echo with a  dilated tricuspid annulus, moderate pulmonary hypertension and biatrial enlargement   . Mitral regurgitation 02/20/2013  . Exertional shortness of breath     "for awhile now" (09/11/2013)  . History of blood transfusion     PAST SURGICAL HISTORY: Past Surgical History  Procedure Laterality Date  . Hemorrhoid surgery  2009    Internal, external hemorrhoidectomy, general anesthesia,prone position. [Other]  . Open reduction and internal fixation of right distal radius fracture using hand innovations distal radius volar locking plate.  07/2005    Dr Ninfa Linden  . Tee without cardioversion N/A 02/17/2013    Procedure: TRANSESOPHAGEAL ECHOCARDIOGRAM (TEE);  Surgeon: [name], MD;  Location: [facility] ENDOSCOPY;  Service: Cardiovascular;  Laterality: N/A;  . Repair of acute type a aortic dissection with resuspension of native aortic valve  10/15/1998    Dr Roxy Manns    FAMILY HISTORY: Family History  Problem Relation Age of Onset  . Thyroid cancer Father     smoked  . Lung cancer Brother     smoked  . Heart attack Mother   . Emphysema Brother     smoked    SOCIAL HISTORY: History   Social History  . Marital Status: Married    Spouse Name: Debbie    Number of Children: 2  . Years of Education: Ph.D   Occupational History  . 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 Smoking status: Never Smoker   . Smokeless tobacco: Never Used  . Alcohol Use: Yes     Comment: 09/11/2013 "might have a drink once/month"  . Drug Use: No  . Sexual Activity: Not Currently   Other Topics Concern  . Not on file   Social History Narrative   Patient lives at home with spouse.   Caffeine Use: eat a lot of chocolate, sodas, coffee and tea occasionally     PHYSICAL EXAM  Filed Vitals:   08/02/14 1356  BP: 130/63  Pulse: 88  Height: '5\' 8"'  (1.727 m)  Weight: 163 lb 9.6 oz (74.208 kg)   Body mass index is 24.88 kg/(m^2).  Generalized: Well developed, in  no acute distress  Neck: Supple, no carotid bruits  Cardiac: IRREG RATE RHYTHM, SYS MURMUR  Neurological examination  MENTAL STATUS: awake, alert, language fluent, comprehension intact, naming intact  CRANIAL NERVE: no papilledema on fundoscopic exam, pupils equal and reactive to light, visual fields full to confrontation, extraocular muscles:  SACCADIC BREAKDOWN OF SMOOTH PURSUIT; END GAZE NYSTAGMUS; PAST POINTING ON SACCADES. Facial sensation and WITH DECR RIGHT EYE CLOSURE WEAKNESS AND RIGHT LOWER FACIAL STRENGTH. Uvula midline, shoulder shrug symmetric, tongue midline.  MOTOR: INCREASED TONE IN BLE > BUE (RIGHT > LEFT). BUE (DELTOID 4+, TRICEP 4, BICEP 5, GRIP 4+), RIGHT LEG (HF 2, KE 3, KF 2, DF 2), LEFT LEG (HF 4, KE/KF 4+, DF 4+) SENSORY: DECR IN RLE TO ALL MODALITIES.  COORDINATION: finger-nose-finger, fine finger movements normal  REFLEXES: BUE 3 (R>L), RLE 3, LLE 2.  GAIT/STATION: DIFF STANDING FROM CHAIR. USES A CANE. SPASTIC GAIT. RIGHT CIRCUMDUCTION. UNSTEADY.    DIAGNOSTIC DATA (LABS, IMAGING, TESTING) - I reviewed patient records, labs, notes, testing and imaging myself where available.  Lab Results  Component Value Date   WBC 4.1 01/31/2014   HGB 13.4 01/31/2014   HCT 38.6 01/31/2014   MCV 81 01/31/2014   PLT 93* 01/31/2014      Component Value Date/Time   NA 147 09/18/2013 0554   K 3.9 09/18/2013 0554   CL 111 09/18/2013 0554   CO2 26 09/18/2013 0554   GLUCOSE 84 09/18/2013 0554   BUN 34* 09/18/2013 0554   CREATININE 1.47* 09/18/2013 0554   CALCIUM 8.9 09/18/2013 0554   PROT 6.6 09/12/2013 0900   ALBUMIN 3.2* 09/12/2013 0900   AST 15 09/12/2013 0900   ALT 17 09/12/2013 0900   ALKPHOS 306* 09/12/2013 0900   BILITOT 0.8 09/12/2013 0900   GFRNONAA 48* 09/18/2013 0554   GFRAA 56* 09/18/2013 0554    11/16/12 MRI brain - There are multiple periventricular and subcortical and pontine chronic demyelinating plaques. There are several T1 black holes. Mild diffuse atrophy. No acute  plaques. In comparison to MRI from 03/26/10, there is no significant change.   08/02/13 MRI brain - multiple brainstem, periventricular, corpus callosal and subcortical white matter hyperintensities compatible with chronic lesions of multiple sclerosis. No enhancing lesions are noted. The presence of T1 black holes and significant atrophy of corpus callosum and cortex indicates chronic disease.  11/16/12 MRI cervical spine - Multiple chronic demyelinating plaques at C2-3, C3-4 and C5-6. No acute plaques. At C5-6: Disc bulging with facet hypertrophy with moderate spinal stenosis, severe right and moderate left foraminal stenosis. At C3-4, C4-5: Disc bulging with mild spinal stenosis and biforaminal stenosis. In comparison to MRI from 03/26/10 there is no significant change.   01/20/13 JCV antibody - positive, index 3.37 (H)    ASSESSMENT AND PLAN  68 y.o. old male with Hypertension; Anemia; Depression; Multiple sclerosis; Thrombocytopenia; Dyslipidemia; Aortic dissection, thoracic; Aortic dissection (11/05/1998); Aneurysm of aortic arch (04/04/2012); Chest pain (11/18/2009); CHF (congestive heart failure), Atrial fibrillation (01/30/2013); Pleural effusion, right, large (01/30/2013); Severe tricuspid valve regurgitation (01/30/2013); Mitral regurgitation (02/20/2013); and Lower extremity edema (02/20/2013) here with multiple sclerosis since 1996. Initially on betaseron, now on tecfidera.   PLAN: 1. Continue Tecfidera and Ampyra. 2. No driving.  3. PT evaluation   Orders Placed This Encounter  Procedures  . CBC With differential/Platelet  . Ambulatory referral to Physical Therapy   Return in about 6 months (around 01/31/2015).    Penni Bombard, MD 47/05/6282, 6:62 PM Certified in Neurology, Neurophysiology and Neuroimaging  [facility] [address], [place], [place] [address] [phone]

## 2014-08-03 LAB — CBC WITH DIFFERENTIAL
Basophils Absolute: 0 10*3/uL (ref 0.0–0.2)
Basos: 1 %
Eos: 3 %
Eosinophils Absolute: 0.2 10*3/uL (ref 0.0–0.4)
HCT: 38.9 % (ref 37.5–51.0)
Hemoglobin: 12.7 g/dL (ref 12.6–17.7)
Immature Grans (Abs): 0 10*3/uL (ref 0.0–0.1)
Immature Granulocytes: 0 %
Lymphocytes Absolute: 0.5 10*3/uL — ABNORMAL LOW (ref 0.7–3.1)
Lymphs: 9 %
MCH: 28.3 pg (ref 26.6–33.0)
MCHC: 32.6 g/dL (ref 31.5–35.7)
MCV: 87 fL (ref 79–97)
Monocytes Absolute: 0.6 10*3/uL (ref 0.1–0.9)
Monocytes: 12 %
Neutrophils Absolute: 3.9 10*3/uL (ref 1.4–7.0)
Neutrophils Relative %: 75 %
Platelets: 110 10*3/uL — ABNORMAL LOW (ref 150–379)
RBC: 4.49 x10E6/uL (ref 4.14–5.80)
RDW: 14.9 % (ref 12.3–15.4)
WBC: 5.3 10*3/uL (ref 3.4–10.8)

## 2014-08-06 ENCOUNTER — Ambulatory Visit: Payer: Medicare Other | Admitting: Diagnostic Neuroimaging

## 2014-08-07 ENCOUNTER — Ambulatory Visit: Payer: Medicare Other | Admitting: Diagnostic Neuroimaging

## 2014-08-08 ENCOUNTER — Ambulatory Visit (INDEPENDENT_AMBULATORY_CARE_PROVIDER_SITE_OTHER): Payer: Medicare Other | Admitting: Pharmacist Clinician (PhC)/ Clinical Pharmacy Specialist

## 2014-08-08 DIAGNOSIS — I4819 Other persistent atrial fibrillation: Secondary | ICD-10-CM

## 2014-08-08 DIAGNOSIS — I481 Persistent atrial fibrillation: Secondary | ICD-10-CM

## 2014-08-08 DIAGNOSIS — Z7901 Long term (current) use of anticoagulants: Secondary | ICD-10-CM

## 2014-08-08 DIAGNOSIS — I4891 Unspecified atrial fibrillation: Secondary | ICD-10-CM

## 2014-08-08 LAB — POCT INR: INR: 2.3

## 2014-09-05 ENCOUNTER — Other Ambulatory Visit: Payer: Self-pay | Admitting: Diagnostic Neuroimaging

## 2014-09-11 ENCOUNTER — Ambulatory Visit: Payer: Medicare Other | Attending: Diagnostic Neuroimaging

## 2014-09-11 ENCOUNTER — Telehealth (HOSPITAL_COMMUNITY): Payer: Self-pay | Admitting: Vascular Surgery

## 2014-09-11 DIAGNOSIS — R269 Unspecified abnormalities of gait and mobility: Secondary | ICD-10-CM | POA: Insufficient documentation

## 2014-09-11 DIAGNOSIS — Z9181 History of falling: Secondary | ICD-10-CM | POA: Diagnosis not present

## 2014-09-11 DIAGNOSIS — R531 Weakness: Secondary | ICD-10-CM | POA: Diagnosis not present

## 2014-09-11 NOTE — Therapy (Signed)
[facility] [address] [place], [place], [address] Phone: [phone]   Fax:  [phone]  Physical Therapy Evaluation  Patient Details  Name: [name] MRN: [id number] Date of Birth: 1945/09/15  Encounter Date: 09/11/2014      PT End of Session - 09/11/14 1659    Visit Number 1   Number of Visits 19   Date for PT Re-Evaluation 11/09/14   Authorization Type G-code every 10th visit.   PT Start Time 1532   PT Stop Time 1623   PT Time Calculation (min) 51 min   Equipment Utilized During Treatment Gait belt   Activity Tolerance Patient tolerated treatment well   Behavior During Therapy WFL for tasks assessed/performed      Past Medical History  Diagnosis Date  . Neuromuscular disorder   . Hypertension   . Anemia   . Depression   . Multiple sclerosis   . Thrombocytopenia   . Dyslipidemia   . Aortic dissection, thoracic   . BPH (benign prostatic hyperplasia)   . Hemorrhoids   . Aortic dissection 11/05/1998    S/P emergency repair of acute type A aortic dissection with resuspension of native aortic valve  . 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 CHF (congestive heart failure) May 15, 1947    2D Echo - EF 50-55%, mild-moderately dilated right ventricle, mild-moderate tricuspid valve regurgitation, moderately dilated right atrium  . Bilateral lower extremity edema   . Atrial fibrillation 01/30/2013    Atrial fibrillation   . Pleural effusion, right, large 01/30/2013  . Severe tricuspid valve regurgitation 01/30/2013    Severe tricuspid regurg by recent 2-D echo with a dilated tricuspid annulus, moderate pulmonary hypertension and biatrial enlargement   . Mitral regurgitation 02/20/2013  . Exertional shortness of breath     "for awhile now" (09/11/2013)  . History of blood  transfusion     Past Surgical History  Procedure Laterality Date  . Hemorrhoid surgery  2009    Internal, external hemorrhoidectomy, general anesthesia,prone position. [Other]  . Open reduction and internal fixation of right distal radius fracture using hand innovations distal radius volar locking plate.  07/2005    Dr Ninfa Linden  . Tee without cardioversion N/A 02/17/2013    Procedure: TRANSESOPHAGEAL ECHOCARDIOGRAM (TEE);  Surgeon: [name], MD;  Location: [facility] ENDOSCOPY;  Service: Cardiovascular;  Laterality: N/A;  . Repair of acute type a aortic dissection with resuspension of native aortic valve  10/15/1998    Dr Roxy Manns    There were no vitals taken for this visit.  Visit Diagnosis:  Abnormality of gait - Plan: PT plan of care cert/re-cert  Weakness generalized - Plan: PT plan of care cert/re-cert      Subjective Assessment - 09/11/14 1545    Symptoms impaired balance during ambulation due to R toe drag   Patient Stated Goals walk better, go for longer walks in the park without walker, walk faster   Currently in Pain? No/denies          [facility] PT Assessment - 09/11/14 1549    Precautions   Precautions Fall;Other (comment). Although pt reports he is not fearful of falling, his gait deviations and impaired balance put him at a higher risk. can not lift/strain due to aorta dissection   Restrictions   Weight Bearing Restrictions No   Balance Screen   Has the patient  fallen in the past 6 months No   Has the patient had a decrease in activity level because of a fear of falling?  No   Is the patient reluctant to leave their home because of a fear of falling?  No   Home Environment   Living Enviornment Private residence   Living Arrangements Spouse/significant other   Available Help at Discharge Family   Type of [place] to enter   Entrance Stairs-Number of Steps 2   Entrance Stairs-Rails Can reach both   [place] Two level;Able to live on main  level with bedroom/bathroom  pt has a chair for flight of stairs    [place] - 4 wheels;Cane - single point;Grab bars - tub/shower;Shower seat - built in   Prior Function   Level of Independence Independent with basic ADLs;Requires assistive device for independence;Independent with transfers   Vocation Retired  [employer]   Leisure swimming, walking at park   Cognition   Overall Cognitive Status Impaired/Different from baseline  pt reports he is not interested in assistance with memory.   Sensation   Light Touch Appears Intact   Posture/Postural Control   Posture/Postural Control Postural limitations   Postural Limitations Forward head;Rounded Shoulders   AROM   Overall AROM  Within functional limits for tasks performed   Strength   Overall Strength Deficits   Overall Strength Comments L LE and B UE WFL. R hip flex: 3+/5, R quad: 4/5, R hamstring: 3+/5, R dorsiflexion: 2/5.   Transfers   Transfers Sit to Stand;Stand to Sit   Sit to Stand 5: Supervision;With upper extremity assist;From chair/3-in-1   Stand to Sit 5: Supervision;With upper extremity assist;To chair/3-in-1   Ambulation/Gait   Ambulation/Gait Yes   Ambulation/Gait Assistance 5: Supervision   Ambulation/Gait Assistance Details Pt ambulated over even terrain with one LOB episode, which pt was able to self correct.   Ambulation Distance (Feet) 150 Feet   Assistive device Rollator   Gait Pattern Step-through pattern;Decreased dorsiflexion - right;Decreased step length - right;Right circumduction;Right genu recurvatum;Poor foot clearance - right  R foot ER   Gait velocity 1.66ft/sec.  10MWT: 29.67sec.   Balance   Balance Assessed Yes   Static Standing Balance   Static Standing - Balance Support No upper extremity supported   Static Standing - Level of Assistance 4: Min assist;Other (comment)  min guard   Static Standing - Comment/# of Minutes Pt able to maintain balance during feet apart with  eyes open/close, but unable to maintain balance with feet together with eyes open or close without min A, pt performed L single leg stance for 1 second before requiring min A to maintain balance.                            PT Short Term Goals - 09/11/14 1639    PT SHORT TERM GOAL #1   Title Pt independent in HEP to improve functional mobility. Target date: 10/09/14.   Status New   PT SHORT TERM GOAL #2   Title Perform BERG and write appropriate STG and LTG. Target date: 10/09/14.   Status New   PT SHORT TERM GOAL #3   Title Pt will perform TUG, with LRAD, in <23 second to decrease falls risk. Target date: 10/09/14.   Status New   PT SHORT TERM GOAL #4   Title Pt will ambulate 300' with LRAD over  even/uneven terrain without LOB to improve functional mobility. Target date: 10/09/14.   Status New   PT SHORT TERM GOAL #5   Title Pt will report he ambulated at the park one time/week, to improve quality of life. Target date: 10/09/14.   Status New           PT Long Term Goals - 09/26/2014 1647    PT LONG TERM GOAL #1   Title Pt will verbalize undertsanding and agreement of falls prevention strategies. Target date: 11/06/14.   Status New   PT LONG TERM GOAL #2   Title Pt will improve gait speed with LRAD to >/=1.6ft/sec. to improve functional mobility. Target date: 11/06/14.   Status New   PT LONG TERM GOAL #3   Title Pt will ambulate 500' over even/uneven terrain with LRAD to improve functional mobility. Target date: 11/06/14.   Status New   PT LONG TERM GOAL #4   Title Pt will perform TUG in <19 seconds, with LRAD, to decrease falls risk. Target date: 11/06/14.   Status New               Plan - 2014-09-26 1543    Clinical Impression Statement Pt is a pleasant 68y/o male presenting to OPPT neuro with history of MS and is now experiencing increased "R toe drag" which creates difficulty ambulating. Pt reported he performs R ankle dorsiflexion every day.  Pt denied  falls in the last six months but he reported he is aware of his potential to fall and is careful when he ambulates in the community.  Pt reported he swims 3x/week at the [facility], which requires a lot of walking.   Pt will benefit from skilled therapeutic intervention in order to improve on the following deficits Abnormal gait;Decreased endurance;Decreased knowledge of use of DME;Decreased balance;Decreased strength;Decreased mobility   Rehab Potential Good   PT Frequency 2x / week   PT Duration 8 weeks   PT Treatment/Interventions ADLs/Self Care Home Management;Gait training;Neuromuscular re-education;Stair training;Functional mobility training;Patient/family education;Therapeutic activities;Therapeutic exercise;Electrical Stimulation;Manual techniques;Balance training;DME Instruction   PT Next Visit Plan Perform BERG, initiate balance/strength HEP.   Consulted and Agree with Plan of Care Patient          G-Codes - Sep 26, 2014 1656    Functional Assessment Tool Used Gait speed with rollator: 1.57ft/sec.; TUG with rollator: 29.27sec.; Left leg single leg stance: 1 second   Functional Limitation Mobility: Walking and moving around   Mobility: Walking and Moving Around Current Status [id number]) At least 40 percent but less than 60 percent impaired, limited or restricted   Mobility: Walking and Moving Around Goal Status [phone]) At least 1 percent but less than 20 percent impaired, limited or restricted       Problem List Patient Active Problem List   Diagnosis Date Noted  . Chronic diastolic congestive heart failure 10/09/2013  . Sinus pause 09/16/2013  . Anemia due to blood loss, acute in setting of supratheraputic INR 09/26/13 09/12/2013  . 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 Acute renal insufficiency 09/12/2013  . Anasarca 09/12/2013  . Anemia 09/12/2013  . Bilateral leg edema Sep 26, 2013  . Acute right-sided congestive heart failure September 26, 2013  . Unstable  gait 2013/09/26  . Long term (current) use of anticoagulants 04/11/2013  . Dissection of aorta, thoracic- surgery Feb 2000 02/20/2013  . Mitral regurgitation 02/20/2013  . Lower extremity edema 02/20/2013  . Persistent atrial fibrillation 01/30/2013  . Volume overload 01/30/2013  . Severe tricuspid valve  regurgitation 01/30/2013  . Pleural effusion, right, large 01/30/2013  . MS (multiple sclerosis) 01/13/2013  . Thrombocytopenia 07/26/2011  . Aortic dissection- chronic abd dissection  11/05/1998    Trase Bunda L 09/11/2014, 5:01 PM  [facility] [address] [place], [place], [address] Phone: [phone]   Fax:  [phone]    Geoffry Paradise, PT,DPT 09/11/2014 5:01 PM Phone: [phone] Fax: [phone]

## 2014-09-11 NOTE — Telephone Encounter (Signed)
Pt called has fluid his weight has went up 10lbs in a month.Marland KitchenMarland Kitchen

## 2014-09-11 NOTE — Telephone Encounter (Signed)
Patient scheduled since he has not been seen since July of this year.

## 2014-09-12 ENCOUNTER — Ambulatory Visit (INDEPENDENT_AMBULATORY_CARE_PROVIDER_SITE_OTHER): Payer: Medicare Other | Admitting: Pharmacist Clinician (PhC)/ Clinical Pharmacy Specialist

## 2014-09-12 DIAGNOSIS — I4819 Other persistent atrial fibrillation: Secondary | ICD-10-CM

## 2014-09-12 DIAGNOSIS — I481 Persistent atrial fibrillation: Secondary | ICD-10-CM

## 2014-09-12 DIAGNOSIS — I4891 Unspecified atrial fibrillation: Secondary | ICD-10-CM

## 2014-09-12 DIAGNOSIS — Z7901 Long term (current) use of anticoagulants: Secondary | ICD-10-CM

## 2014-09-12 LAB — POCT INR: INR: 2.4

## 2014-09-18 ENCOUNTER — Ambulatory Visit: Payer: Medicare Other | Admitting: Physical Therapy

## 2014-09-19 ENCOUNTER — Ambulatory Visit: Payer: Medicare Other | Attending: Diagnostic Neuroimaging

## 2014-09-19 DIAGNOSIS — R531 Weakness: Secondary | ICD-10-CM | POA: Insufficient documentation

## 2014-09-19 DIAGNOSIS — R269 Unspecified abnormalities of gait and mobility: Secondary | ICD-10-CM | POA: Insufficient documentation

## 2014-09-19 DIAGNOSIS — Z9181 History of falling: Secondary | ICD-10-CM | POA: Diagnosis not present

## 2014-09-19 NOTE — Patient Instructions (Signed)
Perform all balance activities in a corner with a chair in front of you for safety:  Feet Apart, Varied Arm Positions - Eyes Closed   Stand with feet shoulder width apart and arms at your side. Close eyes and visualize upright position. Hold _10-30___ seconds. Repeat __3__ times per session. Do __1__ sessions per day.  Copyright  VHI. All rights reserved.  Feet Partial Heel-Toe, Varied Arm Positions - Eyes Open   With eyes open, right foot partially in front of the other, arms at your side, look straight ahead at a stationary object. Hold _10-30___ seconds. Repeat _3___ times per session. Do __1__ sessions per day.  Copyright  VHI. All rights reserved.  Feet Together, Head Motion - Eyes Open   With eyes open, feet together, move head slowly: up and down and side to side for 30 seconds. Repeat _3___ times per session. Do _1___ sessions per day.  Copyright  VHI. All rights reserved.  Single Leg - Eyes Open   Holding support, lift right leg while maintaining balance over other leg. Repeat with left leg lifted10 Progress to removing hands from support surface for longer periods of time. Hold_-30___ seconds. Repeat __3__ times per session per leg. Do __3__ sessions per day.  Copyright  VHI. All rights reserved.

## 2014-09-19 NOTE — Therapy (Signed)
[facility] [address] [place], [place], [address] Phone: [phone]   Fax:  [phone]  Physical Therapy Treatment  Patient Details  Name: [name] MRN: [id number] Date of Birth: [birth year]  Encounter Date: 09/19/2014      PT End of Session - 09/19/14 1454    Visit Number 2   Number of Visits 17   Date for PT Re-Evaluation 11/09/14   Authorization Type G-code every 10th visit.   PT Start Time 1404   PT Stop Time 1449   PT Time Calculation (min) 45 min   Equipment Utilized During Treatment Gait belt   Activity Tolerance Patient tolerated treatment well   Behavior During Therapy WFL for tasks assessed/performed      Past Medical History  Diagnosis Date  . Neuromuscular disorder   . Hypertension   . Anemia   . Depression   . Multiple sclerosis   . Thrombocytopenia   . Dyslipidemia   . Aortic dissection, thoracic   . BPH (benign prostatic hyperplasia)   . Hemorrhoids   . Aortic dissection 11/05/1998    S/P emergency repair of acute type A aortic dissection with resuspension of native aortic valve  . 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 CHF (congestive heart failure) 07/10/47    2D Echo - EF 50-55%, mild-moderately dilated right ventricle, mild-moderate tricuspid valve regurgitation, moderately dilated right atrium  . Bilateral lower extremity edema   . Atrial fibrillation 01/30/2013    Atrial fibrillation   . Pleural effusion, right, large 01/30/2013  . Severe tricuspid valve regurgitation 01/30/2013    Severe tricuspid regurg by recent 2-D echo with a dilated tricuspid annulus, moderate pulmonary hypertension and biatrial enlargement   . Mitral regurgitation 02/20/2013  . Exertional shortness of breath     "for awhile now" (09/11/2013)  . History of blood transfusion      Past Surgical History  Procedure Laterality Date  . Hemorrhoid surgery  2009    Internal, external hemorrhoidectomy, general anesthesia,prone position. [Other]  . Open reduction and internal fixation of right distal radius fracture using hand innovations distal radius volar locking plate.  07/2005    Dr Ninfa Linden  . Tee without cardioversion N/A 02/17/2013    Procedure: TRANSESOPHAGEAL ECHOCARDIOGRAM (TEE);  Surgeon: [name], MD;  Location: [facility] ENDOSCOPY;  Service: Cardiovascular;  Laterality: N/A;  . Repair of acute type a aortic dissection with resuspension of native aortic valve  10/15/1998    Dr Roxy Manns    There were no vitals taken for this visit.  Visit Diagnosis:  Abnormality of gait  Weakness generalized      Subjective Assessment - 09/19/14 1407    Symptoms Pt denied falls or changes since last visit.   Patient Stated Goals walk better, go for longer walks in the park without walker, walk faster   Currently in Pain? No/denies                    [facility] Adult PT Treatment/Exercise - 09/19/14 1408    Balance   Balance Assessed Yes   Static Standing Balance   Static Standing - Balance Support No upper extremity supported   Static Standing - Level of Assistance 4: Min assist;Other (comment)  min guard   Static Standing - Comment/# of Minutes Pt performed standing activites with chair in front for safety and  min guard to min A during 3 LOB episodes, all performed with B LE in 1-2 sets with 10-30 second holds: feet apart/together with eyes open/closed, feet apart/together with head turns, modified tandem stance and single leg stance with UE support. VC's to improve weight shifting and for technique.   Standardized Balance Assessment   Standardized Balance Assessment Berg Balance Test  pt required rest breaks due to fatigue.   Berg Balance Test   Sit to Stand Able to stand  independently using hands   Standing Unsupported Able to stand 2 minutes with  supervision   Sitting with Back Unsupported but Feet Supported on Floor or Stool Able to sit safely and securely 2 minutes   Stand to Sit Controls descent by using hands   Transfers Able to transfer with verbal cueing and /or supervision   Standing Unsupported with Eyes Closed Able to stand 10 seconds with supervision   Standing Ubsupported with Feet Together Needs help to attain position but able to stand for 30 seconds with feet together   From Standing, Reach Forward with Outstretched Arm Can reach forward >5 cm safely (2")  4"   From Standing Position, Pick up Object from Floor Able to pick up shoe, needs supervision   From Standing Position, Turn to Look Behind Over each Shoulder Turn sideways only but maintains balance   Turn 360 Degrees Needs assistance while turning   Standing Unsupported, Alternately Place Feet on Step/Stool Needs assistance to keep from falling or unable to try   Standing Unsupported, One Foot in Front Able to take small step independently and hold 30 seconds   Standing on One Leg Tries to lift leg/unable to hold 3 seconds but remains standing independently   Total Score 29                PT Education - 09/19/14 1453    Education provided Yes   Education Details Balance HEP. PT discussed and showed squatty potty device on internet to assist with constipation as pt reported constipation last visit.   Person(s) Educated Patient   Methods Explanation;Demonstration;Tactile cues;Verbal cues;Handout   Comprehension Verbalized understanding;Returned demonstration          PT Short Term Goals - 09/19/14 1456    PT SHORT TERM GOAL #1   Title Pt independent in HEP to improve functional mobility. Target date: 10/09/14.   Status On-going   PT SHORT TERM GOAL #2   Title Perform BERG and write appropriate STG and LTG. Target date: 10/09/14.   Status Achieved   PT SHORT TERM GOAL #3   Title Pt will perform TUG, with LRAD, in <23 second to decrease falls risk.  Target date: 10/09/14.   Status On-going   PT SHORT TERM GOAL #4   Title Pt will ambulate 300' with LRAD over even/uneven terrain without LOB to improve functional mobility. Target date: 10/09/14.   Status On-going   PT SHORT TERM GOAL #5   Title Pt will report he ambulated at the park one time/week, to improve quality of life. Target date: 10/09/14.   Status On-going   Additional Short Term Goals   Additional Short Term Goals Yes   PT SHORT TERM GOAL #6   Title Pt will improve BERG balance score to >/=33/56 to decrease falls risk. Target date: 10/09/14.   Status New           PT Long Term Goals - 09/19/14 1456    PT LONG TERM GOAL #1   Title  Pt will verbalize undertsanding and agreement of falls prevention strategies. Target date: 11/06/14.   Status On-going   PT LONG TERM GOAL #2   Title Pt will improve gait speed with LRAD to >/=1.35ft/sec. to improve functional mobility. Target date: 11/06/14.   Status On-going   PT LONG TERM GOAL #3   Title Pt will ambulate 500' over even/uneven terrain with LRAD to improve functional mobility. Target date: 11/06/14.   Status On-going   PT LONG TERM GOAL #4   Title Pt will perform TUG in <19 seconds, with LRAD, to decrease falls risk. Target date: 11/06/14.   Status On-going   PT LONG TERM GOAL #5   Title Pt will improve BERG balance score to >/=37/56 to decrease falls risk. Target date: 11/06/14.   Status New               Plan - 09/19/14 1455    Clinical Impression Statement Pt scored a 29/56 on BERG balance test indicating he is at a significant risk for recurrent falls. Pt tolerated balance HEP well. However, he did require frequent seated rest breaks during session due to fatigue. Continue with POC.   Pt will benefit from skilled therapeutic intervention in order to improve on the following deficits Abnormal gait;Decreased endurance;Decreased knowledge of use of DME;Decreased balance;Decreased strength;Decreased mobility   Rehab  Potential Good   PT Frequency 2x / week   PT Duration 8 weeks   PT Treatment/Interventions ADLs/Self Care Home Management;Gait training;Neuromuscular re-education;Stair training;Functional mobility training;Patient/family education;Therapeutic activities;Therapeutic exercise;Electrical Stimulation;Manual techniques;Balance training;DME Instruction   PT Next Visit Plan Initiate strength HEP.   Consulted and Agree with Plan of Care Patient        Problem List Patient Active Problem List   Diagnosis Date Noted  . Chronic diastolic congestive heart failure 10/09/2013  . Sinus pause 09/16/2013  . Anemia due to blood loss, acute in setting of supratheraputic INR 09/11/13 09/12/2013  . 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 Acute renal insufficiency 09/12/2013  . Anasarca 09/12/2013  . Anemia 09/12/2013  . Bilateral leg edema 09/11/2013  . Acute right-sided congestive heart failure 09/11/2013  . Unstable gait 09/11/2013  . Long term (current) use of anticoagulants 04/11/2013  . Dissection of aorta, thoracic- surgery Feb 2000 02/20/2013  . Mitral regurgitation 02/20/2013  . Lower extremity edema 02/20/2013  . Persistent atrial fibrillation 01/30/2013  . Volume overload 01/30/2013  . Severe tricuspid valve regurgitation 01/30/2013  . Pleural effusion, right, large 01/30/2013  . MS (multiple sclerosis) 01/13/2013  . Thrombocytopenia 07/26/2011  . Aortic dissection- chronic abd dissection  11/05/1998    Amilcar Reever L 09/19/2014, 2:59 PM  [facility] [address] [place], [place], [address] Phone: [phone]   Fax:  [phone]     Geoffry Paradise, PT,DPT 09/19/2014 2:59 PM Phone: [phone] Fax: [phone]

## 2014-09-25 ENCOUNTER — Ambulatory Visit: Payer: Medicare Other | Admitting: Physical Therapy

## 2014-09-25 ENCOUNTER — Encounter: Payer: Self-pay | Admitting: Physical Therapy

## 2014-09-25 DIAGNOSIS — R531 Weakness: Secondary | ICD-10-CM

## 2014-09-25 DIAGNOSIS — R269 Unspecified abnormalities of gait and mobility: Secondary | ICD-10-CM | POA: Diagnosis not present

## 2014-09-25 NOTE — Therapy (Signed)
[facility] [address] [place], [place], [address] Phone: [phone]   Fax:  [phone]  Physical Therapy Treatment  Patient Details  Name: [name] MRN: [id number] Date of Birth: 1945-12-06 Referring Provider:  Criselda Peaches, MD  Encounter Date: 09/25/2014      PT End of Session - 09/25/14 1746    Visit Number 3   Number of Visits 17   Date for PT Re-Evaluation 11/09/14   Authorization Type G-code every 10th visit.   PT Start Time 1450   PT Stop Time 1534   PT Time Calculation (min) 44 min   Equipment Utilized During Treatment Gait belt   Activity Tolerance Patient tolerated treatment well   Behavior During Therapy WFL for tasks assessed/performed      Past Medical History  Diagnosis Date  . Neuromuscular disorder   . Hypertension   . Anemia   . Depression   . Multiple sclerosis   . Thrombocytopenia   . Dyslipidemia   . Aortic dissection, thoracic   . BPH (benign prostatic hyperplasia)   . Hemorrhoids   . Aortic dissection 11/05/1998    S/P emergency repair of acute type A aortic dissection with resuspension of native aortic valve  . 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 CHF (congestive heart failure) Jun 07, 1947    2D Echo - EF 50-55%, mild-moderately dilated right ventricle, mild-moderate tricuspid valve regurgitation, moderately dilated right atrium  . Bilateral lower extremity edema   . Atrial fibrillation 01/30/2013    Atrial fibrillation   . Pleural effusion, right, large 01/30/2013  . Severe tricuspid valve regurgitation 01/30/2013    Severe tricuspid regurg by recent 2-D echo with a dilated tricuspid annulus, moderate pulmonary hypertension and biatrial enlargement   . Mitral regurgitation 02/20/2013  . Exertional shortness of breath     "for awhile now"  (09/11/2013)  . History of blood transfusion     Past Surgical History  Procedure Laterality Date  . Hemorrhoid surgery  2009    Internal, external hemorrhoidectomy, general anesthesia,prone position. [Other]  . Open reduction and internal fixation of right distal radius fracture using hand innovations distal radius volar locking plate.  07/2005    Dr Ninfa Linden  . Tee without cardioversion N/A 02/17/2013    Procedure: TRANSESOPHAGEAL ECHOCARDIOGRAM (TEE);  Surgeon: [name], MD;  Location: [facility] ENDOSCOPY;  Service: Cardiovascular;  Laterality: N/A;  . Repair of acute type a aortic dissection with resuspension of native aortic valve  10/15/1998    Dr Roxy Manns    There were no vitals taken for this visit.  Visit Diagnosis:  Abnormality of gait  Weakness generalized      Subjective Assessment - 09/25/14 1742    Symptoms Denies falls or changes.  States he has been performing HEP given on previous visit.   Currently in Pain? No/denies                    [facility] Adult PT Treatment/Exercise - 09/25/14 1743    Transfers   Transfers Sit to Stand;Stand to Sit   Sit to Stand 5: Supervision;With upper extremity assist;From chair/3-in-1   Stand to Sit 5: Supervision;With upper extremity assist;To chair/3-in-1   Ambulation/Gait   Ambulation/Gait Yes   Ambulation/Gait Assistance 5: Supervision   Ambulation/Gait Assistance Details trialed R [place], R [place] and R Toe off.  4 times   Ambulation Distance (Feet) 110 Feet  4 times   Assistive device Rollator   Gait Pattern Decreased hip/knee flexion - right;Decreased dorsiflexion - right;Right circumduction;Lateral trunk lean to left;Poor foot clearance - right  Keeps right hip externally rotated to clear RLE   Ambulation Surface Level                PT Education - 09/25/14 1746    Education provided Yes   Education Details Purpose of AFO   Person(s) Educated Patient   Methods Explanation;Demonstration    Comprehension Verbalized understanding          PT Short Term Goals - 09/19/14 1456    PT SHORT TERM GOAL #1   Title Pt independent in HEP to improve functional mobility. Target date: 10/09/14.   Status On-going   PT SHORT TERM GOAL #2   Title Perform BERG and write appropriate STG and LTG. Target date: 10/09/14.   Status Achieved   PT SHORT TERM GOAL #3   Title Pt will perform TUG, with LRAD, in <23 second to decrease falls risk. Target date: 10/09/14.   Status On-going   PT SHORT TERM GOAL #4   Title Pt will ambulate 300' with LRAD over even/uneven terrain without LOB to improve functional mobility. Target date: 10/09/14.   Status On-going   PT SHORT TERM GOAL #5   Title Pt will report he ambulated at the park one time/week, to improve quality of life. Target date: 10/09/14.   Status On-going   Additional Short Term Goals   Additional Short Term Goals Yes   PT SHORT TERM GOAL #6   Title Pt will improve BERG balance score to >/=33/56 to decrease falls risk. Target date: 10/09/14.   Status New           PT Long Term Goals - 09/19/14 1456    PT LONG TERM GOAL #1   Title Pt will verbalize undertsanding and agreement of falls prevention strategies. Target date: 11/06/14.   Status On-going   PT LONG TERM GOAL #2   Title Pt will improve gait speed with LRAD to >/=1.68ft/sec. to improve functional mobility. Target date: 11/06/14.   Status On-going   PT LONG TERM GOAL #3   Title Pt will ambulate 500' over even/uneven terrain with LRAD to improve functional mobility. Target date: 11/06/14.   Status On-going   PT LONG TERM GOAL #4   Title Pt will perform TUG in <19 seconds, with LRAD, to decrease falls risk. Target date: 11/06/14.   Status On-going   PT LONG TERM GOAL #5   Title Pt will improve BERG balance score to >/=37/56 to decrease falls risk. Target date: 11/06/14.   Status New               Plan - 09/25/14 1747    Clinical Impression Statement Continue per POC.  Focus  on gait and trial AFO's again.  LE strengthening program.   Pt will benefit from skilled therapeutic intervention in order to improve on the following deficits Abnormal gait;Decreased endurance;Decreased knowledge of use of DME;Decreased balance;Decreased strength;Decreased mobility   Rehab Potential Good   PT Frequency 2x / week   PT Duration 8 weeks   PT Treatment/Interventions ADLs/Self Care Home Management;Gait training;Neuromuscular re-education;Stair training;Functional mobility training;Patient/family education;Therapeutic activities;Therapeutic exercise;Electrical Stimulation;Manual techniques;Balance training;DME Instruction   PT Next Visit Plan Initiate strength HEP.  Gait with AFO's.   Consulted and Agree with Plan of Care Patient  Problem List Patient Active Problem List   Diagnosis Date Noted  . Chronic diastolic congestive heart failure 10/09/2013  . Sinus pause 09/16/2013  . Anemia due to blood loss, acute in setting of supratheraputic INR 09/11/13 09/12/2013  . 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 Acute renal insufficiency 09/12/2013  . Anasarca 09/12/2013  . Anemia 09/12/2013  . Bilateral leg edema 09/11/2013  . Acute right-sided congestive heart failure 09/11/2013  . Unstable gait 09/11/2013  . Long term (current) use of anticoagulants 04/11/2013  . Dissection of aorta, thoracic- surgery Feb 2000 02/20/2013  . Mitral regurgitation 02/20/2013  . Lower extremity edema 02/20/2013  . Persistent atrial fibrillation 01/30/2013  . Volume overload 01/30/2013  . Severe tricuspid valve regurgitation 01/30/2013  . Pleural effusion, right, large 01/30/2013  . MS (multiple sclerosis) 01/13/2013  . Thrombocytopenia 07/26/2011  . Aortic dissection- chronic abd dissection  11/05/1998    Narda Bonds 09/25/2014, 5:49 PM  [facility] [address] [place], [place],  [address] Phone: [phone]   Fax:  [place], [place] 09/25/2014 5:49 PM Phone: [phone] Fax: [phone]

## 2014-09-27 ENCOUNTER — Ambulatory Visit: Payer: Medicare Other

## 2014-09-27 ENCOUNTER — Ambulatory Visit: Payer: Medicare Other | Admitting: Physical Therapy

## 2014-10-02 ENCOUNTER — Encounter: Payer: Self-pay | Admitting: Physical Therapy

## 2014-10-02 ENCOUNTER — Ambulatory Visit: Payer: Medicare Other | Admitting: Physical Therapy

## 2014-10-02 DIAGNOSIS — R269 Unspecified abnormalities of gait and mobility: Secondary | ICD-10-CM | POA: Diagnosis not present

## 2014-10-02 DIAGNOSIS — R531 Weakness: Secondary | ICD-10-CM

## 2014-10-03 NOTE — Therapy (Signed)
[facility] [address] [place], [place], [address] Phone: [phone]   Fax:  [phone]  Physical Therapy Treatment  Patient Details  Name: [name] MRN: [id number] Date of Birth: 12/25/1945 Referring Provider:  Criselda Peaches, MD  Encounter Date: 10/02/2014      PT End of Session - 10/03/14 1248    Visit Number 4   Number of Visits 17   Date for PT Re-Evaluation 11/09/14   Authorization Type G-code every 10th visit.   PT Start Time 1408   PT Stop Time 1448   PT Time Calculation (min) 40 min   Equipment Utilized During Treatment Gait belt   Activity Tolerance Patient tolerated treatment well   Behavior During Therapy WFL for tasks assessed/performed      Past Medical History  Diagnosis Date  . Neuromuscular disorder   . Hypertension   . Anemia   . Depression   . Multiple sclerosis   . Thrombocytopenia   . Dyslipidemia   . Aortic dissection, thoracic   . BPH (benign prostatic hyperplasia)   . Hemorrhoids   . Aortic dissection 11/05/1998    S/P emergency repair of acute type A aortic dissection with resuspension of native aortic valve  . 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 CHF (congestive heart failure) January 05, 1947    2D Echo - EF 50-55%, mild-moderately dilated right ventricle, mild-moderate tricuspid valve regurgitation, moderately dilated right atrium  . Bilateral lower extremity edema   . Atrial fibrillation 01/30/2013    Atrial fibrillation   . Pleural effusion, right, large 01/30/2013  . Severe tricuspid valve regurgitation 01/30/2013    Severe tricuspid regurg by recent 2-D echo with a dilated tricuspid annulus, moderate pulmonary hypertension and biatrial enlargement   . Mitral regurgitation 02/20/2013  . Exertional shortness of breath     "for awhile now"  (09/11/2013)  . History of blood transfusion     Past Surgical History  Procedure Laterality Date  . Hemorrhoid surgery  2009    Internal, external hemorrhoidectomy, general anesthesia,prone position. [Other]  . Open reduction and internal fixation of right distal radius fracture using hand innovations distal radius volar locking plate.  07/2005    Dr Ninfa Linden  . Tee without cardioversion N/A 02/17/2013    Procedure: TRANSESOPHAGEAL ECHOCARDIOGRAM (TEE);  Surgeon: [name], MD;  Location: [facility] ENDOSCOPY;  Service: Cardiovascular;  Laterality: N/A;  . Repair of acute type a aortic dissection with resuspension of native aortic valve  10/15/1998    Dr Roxy Manns    There were no vitals taken for this visit.  Visit Diagnosis:  Abnormality of gait  Weakness generalized      Subjective Assessment - 10/02/14 1413    Symptoms Denies falls or changes.   Currently in Pain? No/denies      Gait with Rollator and SPC x 110' x 2 working on R LE placement and using hip flexors for advancing RLE vs circumduction. Taps to 2" and 4" step in parallel bars with 2 UE's, 1 UE then no UE assist focusing on hip flexion Balance on rocker board with UE assist with head turns/nods and working on hip strategies to recover LOB. Leaning against counter posteriorly working on anterior/posterior hip strategies. Gait in parallel bars without UE assist forward, backward and sideways.         PT Short Term Goals -  09/19/14 1456    PT SHORT TERM GOAL #1   Title Pt independent in HEP to improve functional mobility. Target date: 10/09/14.   Status On-going   PT SHORT TERM GOAL #2   Title Perform BERG and write appropriate STG and LTG. Target date: 10/09/14.   Status Achieved   PT SHORT TERM GOAL #3   Title Pt will perform TUG, with LRAD, in <23 second to decrease falls risk. Target date: 10/09/14.   Status On-going   PT SHORT TERM GOAL #4   Title Pt will ambulate 300' with LRAD over even/uneven terrain  without LOB to improve functional mobility. Target date: 10/09/14.   Status On-going   PT SHORT TERM GOAL #5   Title Pt will report he ambulated at the park one time/week, to improve quality of life. Target date: 10/09/14.   Status On-going   Additional Short Term Goals   Additional Short Term Goals Yes   PT SHORT TERM GOAL #6   Title Pt will improve BERG balance score to >/=33/56 to decrease falls risk. Target date: 10/09/14.   Status New           PT Long Term Goals - 09/19/14 1456    PT LONG TERM GOAL #1   Title Pt will verbalize undertsanding and agreement of falls prevention strategies. Target date: 11/06/14.   Status On-going   PT LONG TERM GOAL #2   Title Pt will improve gait speed with LRAD to >/=1.17ft/sec. to improve functional mobility. Target date: 11/06/14.   Status On-going   PT LONG TERM GOAL #3   Title Pt will ambulate 500' over even/uneven terrain with LRAD to improve functional mobility. Target date: 11/06/14.   Status On-going   PT LONG TERM GOAL #4   Title Pt will perform TUG in <19 seconds, with LRAD, to decrease falls risk. Target date: 11/06/14.   Status On-going   PT LONG TERM GOAL #5   Title Pt will improve BERG balance score to >/=37/56 to decrease falls risk. Target date: 11/06/14.   Status New               Plan - 10/03/14 1248    Clinical Impression Statement Pt needs redirection during session-very talkative.  Continue per POC   Pt will benefit from skilled therapeutic intervention in order to improve on the following deficits Abnormal gait;Decreased endurance;Decreased knowledge of use of DME;Decreased balance;Decreased strength;Decreased mobility   Rehab Potential Good   PT Frequency 2x / week   PT Duration 8 weeks   PT Treatment/Interventions ADLs/Self Care Home Management;Gait training;Neuromuscular re-education;Stair training;Functional mobility training;Patient/family education;Therapeutic activities;Therapeutic exercise;Electrical  Stimulation;Manual techniques;Balance training;DME Instruction   PT Next Visit Plan Contact [facility] [id number] consult.  Continue gait, balance and strengthening.   Consulted and Agree with Plan of Care Patient        Problem List Patient Active Problem List   Diagnosis Date Noted  . Chronic diastolic congestive heart failure 10/09/2013  . Sinus pause 09/16/2013  . Anemia due to blood loss, acute in setting of supratheraputic INR 09/11/13 09/12/2013  . 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 Acute renal insufficiency 09/12/2013  . Anasarca 09/12/2013  . Anemia 09/12/2013  . Bilateral leg edema 09/11/2013  . Acute right-sided congestive heart failure 09/11/2013  . Unstable gait 09/11/2013  . Long term (current) use of anticoagulants 04/11/2013  . Dissection of aorta, thoracic- surgery Feb 2000 02/20/2013  . Mitral regurgitation 02/20/2013  . Lower extremity edema 02/20/2013  .  Persistent atrial fibrillation 01/30/2013  . Volume overload 01/30/2013  . Severe tricuspid valve regurgitation 01/30/2013  . Pleural effusion, right, large 01/30/2013  . MS (multiple sclerosis) 01/13/2013  . Thrombocytopenia 07/26/2011  . Aortic dissection- chronic abd dissection  11/05/1998    Narda Bonds 10/03/2014, 12:50 PM  [facility] [address] [place], [place], [address] Phone: [phone]   Fax:  [employer], [place] 10/03/2014 12:52 PM Phone: [phone] Fax: [phone]

## 2014-10-04 ENCOUNTER — Ambulatory Visit: Payer: Medicare Other

## 2014-10-04 DIAGNOSIS — R531 Weakness: Secondary | ICD-10-CM

## 2014-10-04 DIAGNOSIS — R269 Unspecified abnormalities of gait and mobility: Secondary | ICD-10-CM

## 2014-10-04 NOTE — Patient Instructions (Addendum)
Bridge   Lie back, legs bent, squeeze buttom muscles and tuck in tummy, then raise hips towards ceiling. Hold for 2 seconds and then slowly lower back down. Repeat _3 sets of 10 reps. Do __1__ sessions per day.  Copyright  VHI. All rights reserved.   FLEXION: Supine (Active)   Lie on back, legs bent with feet flat on the mat. Draw right knee toward chest and then lower it, then draw left knee toward chest and then lower it. Complete __3_ sets of _10__ repetitions. Perform _1__ sessions per day.  Copyright  VHI. All rights reserved.   Functional Quadriceps: Sit to Stand   Sit on edge of chair, feet flat on floor. Stand upright, extending knees fully then sit back down. Repeat __10__ times per set. Do __1 per day.  Copyright  VHI. All rights reserved.  __ sets per session. Do _1___ sessions per day.  http://orth.exer.us/734   Copyright  VHI. All rights reserved.   HIP / KNEE: Flexion, Heel Slides - Supine   Slide heel up toward buttocks, keeping leg in straight line. __10_ reps per set, _3__ sets per day, _7__ days per week Use towel or pillowcase under heel as needed. Repeat with other leg.   Copyright  VHI. All rights reserved.

## 2014-10-04 NOTE — Therapy (Signed)
[facility] [address] [place], [place], [address] Phone: [phone]   Fax:  [phone]  Physical Therapy Treatment  Patient Details  Name: [name] MRN: [id number] Date of Birth: 12/15/1945 Referring Provider:  Criselda Peaches, MD  Encounter Date: 10/04/2014      PT End of Session - 10/04/14 1601    Visit Number 5   Number of Visits 17   Date for PT Re-Evaluation 11/09/14   Authorization Type G-code every 10th visit.   PT Start Time 1400   PT Stop Time 1443   PT Time Calculation (min) 43 min   Equipment Utilized During Treatment Gait belt   Activity Tolerance Patient tolerated treatment well   Behavior During Therapy WFL for tasks assessed/performed      Past Medical History  Diagnosis Date  . Neuromuscular disorder   . Hypertension   . Anemia   . Depression   . Multiple sclerosis   . Thrombocytopenia   . Dyslipidemia   . Aortic dissection, thoracic   . BPH (benign prostatic hyperplasia)   . Hemorrhoids   . Aortic dissection 11/05/1998    S/P emergency repair of acute type A aortic dissection with resuspension of native aortic valve  . 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 CHF (congestive heart failure) October 14, 1945    2D Echo - EF 50-55%, mild-moderately dilated right ventricle, mild-moderate tricuspid valve regurgitation, moderately dilated right atrium  . Bilateral lower extremity edema   . Atrial fibrillation 01/30/2013    Atrial fibrillation   . Pleural effusion, right, large 01/30/2013  . Severe tricuspid valve regurgitation 01/30/2013    Severe tricuspid regurg by recent 2-D echo with a dilated tricuspid annulus, moderate pulmonary hypertension and biatrial enlargement   . Mitral regurgitation 02/20/2013  . Exertional shortness of breath     "for awhile now"  (09/11/2013)  . History of blood transfusion     Past Surgical History  Procedure Laterality Date  . Hemorrhoid surgery  2009    Internal, external hemorrhoidectomy, general anesthesia,prone position. [Other]  . Open reduction and internal fixation of right distal radius fracture using hand innovations distal radius volar locking plate.  07/2005    Dr Ninfa Linden  . Tee without cardioversion N/A 02/17/2013    Procedure: TRANSESOPHAGEAL ECHOCARDIOGRAM (TEE);  Surgeon: [name], MD;  Location: [facility] ENDOSCOPY;  Service: Cardiovascular;  Laterality: N/A;  . Repair of acute type a aortic dissection with resuspension of native aortic valve  10/15/1998    Dr Roxy Manns    There were no vitals taken for this visit.  Visit Diagnosis:  Abnormality of gait  Weakness generalized      Subjective Assessment - 10/04/14 1406    Symptoms Pt denied falls or changes.    Patient Stated Goals walk better, go for longer walks in the park without walker, walk faster   Currently in Pain? No/denies     Therex: -Sit<>stands x10 with no UE support to stand and 1 UE support during stand to sit. VC's for technique and to improve eccentric control. -Bridges in supine 3x10, with glut max and TrA activation. VC's for technique. -Supine hip marches with TrA activated. 3x10/LE. VC's to improve technique and eccentric control. -B heel slides 3x10 in supine, with towel placed under R LE to decrease friction. VC's for technique. Pt required rest breaks after each  set due to fatigue.                        PT Education - 10/04/14 1600    Education provided Yes   Education Details B LE strengthening HEP.   Person(s) Educated Patient   Methods Explanation;Demonstration   Comprehension Verbalized understanding;Returned demonstration          PT Short Term Goals - 10/04/14 1603    PT SHORT TERM GOAL #1   Title Pt independent in HEP to improve functional mobility. Target date: 10/09/14.   Status  On-going   PT SHORT TERM GOAL #2   Title Perform BERG and write appropriate STG and LTG. Target date: 10/09/14.   Status Achieved   PT SHORT TERM GOAL #3   Title Pt will perform TUG, with LRAD, in <23 second to decrease falls risk. Target date: 10/09/14.   Status On-going   PT SHORT TERM GOAL #4   Title Pt will ambulate 300' with LRAD over even/uneven terrain without LOB to improve functional mobility. Target date: 10/09/14.   Status On-going   PT SHORT TERM GOAL #5   Title Pt will report he ambulated at the park one time/week, to improve quality of life. Target date: 10/09/14.   Status On-going   PT SHORT TERM GOAL #6   Title Pt will improve BERG balance score to >/=33/56 to decrease falls risk. Target date: 10/09/14.   Status On-going           PT Long Term Goals - 10/04/14 1603    PT LONG TERM GOAL #1   Title Pt will verbalize undertsanding and agreement of falls prevention strategies. Target date: 11/06/14.   Status On-going   PT LONG TERM GOAL #2   Title Pt will improve gait speed with LRAD to >/=1.20ft/sec. to improve functional mobility. Target date: 11/06/14.   Status On-going   PT LONG TERM GOAL #3   Title Pt will ambulate 500' over even/uneven terrain with LRAD to improve functional mobility. Target date: 11/06/14.   Status On-going   PT LONG TERM GOAL #4   Title Pt will perform TUG in <19 seconds, with LRAD, to decrease falls risk. Target date: 11/06/14.   Status On-going   PT LONG TERM GOAL #5   Title Pt will improve BERG balance score to >/=37/56 to decrease falls risk. Target date: 11/06/14.   Status On-going               Plan - 10/04/14 1601    Clinical Impression Statement Pt demonstrated progress towards goals, as he tolerated strengthening HEP well with minimal cues. Pt continues to require re-direction to task, as he is very talkative. Pt would continue to benefit from skilled PT to improve safety during functional mobilty. PT faxed AFO fitting request to  [facility] today.   Pt will benefit from skilled therapeutic intervention in order to improve on the following deficits Abnormal gait;Decreased endurance;Decreased knowledge of use of DME;Decreased balance;Decreased strength;Decreased mobility   Rehab Potential Good   PT Frequency 2x / week   PT Duration 8 weeks   PT Treatment/Interventions ADLs/Self Care Home Management;Gait training;Neuromuscular re-education;Stair training;Functional mobility training;Patient/family education;Therapeutic activities;Therapeutic exercise;Electrical Stimulation;Manual techniques;Balance training;DME Instruction   PT Next Visit Plan Continue gait, balance and strengthening (add progressed knee flexion therex for LLE).   Consulted and Agree with Plan of Care Patient        Problem List Patient Active Problem List   Diagnosis Date  Noted  . Chronic diastolic congestive heart failure 10/09/2013  . Sinus pause 09/16/2013  . Anemia due to blood loss, acute in setting of supratheraputic INR 09/11/13 09/12/2013  . 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 Acute renal insufficiency 09/12/2013  . Anasarca 09/12/2013  . Anemia 09/12/2013  . Bilateral leg edema 09/11/2013  . Acute right-sided congestive heart failure 09/11/2013  . Unstable gait 09/11/2013  . Long term (current) use of anticoagulants 04/11/2013  . Dissection of aorta, thoracic- surgery Feb 2000 02/20/2013  . Mitral regurgitation 02/20/2013  . Lower extremity edema 02/20/2013  . Persistent atrial fibrillation 01/30/2013  . Volume overload 01/30/2013  . Severe tricuspid valve regurgitation 01/30/2013  . Pleural effusion, right, large 01/30/2013  . MS (multiple sclerosis) 01/13/2013  . Thrombocytopenia 07/26/2011  . Aortic dissection- chronic abd dissection  11/05/1998    Treyshaun Keatts L 10/04/2014, 4:13 PM  [facility] [address] [place], [place],  [address] Phone: [phone]   Fax:  [phone]    Geoffry Paradise, PT,DPT 10/04/2014 4:13 PM Phone: [phone] Fax: [phone]

## 2014-10-08 ENCOUNTER — Ambulatory Visit (HOSPITAL_COMMUNITY)
Admission: RE | Admit: 2014-10-08 | Discharge: 2014-10-08 | Disposition: A | Discharge status: Home / Self Care | Payer: Medicare Other | Source: Ambulatory Visit | Attending: Cardiology | Admitting: Cardiology

## 2014-10-08 ENCOUNTER — Encounter (HOSPITAL_COMMUNITY): Payer: Self-pay

## 2014-10-08 VITALS — BP 136/64 | HR 73 | Resp 20 | Wt 164.8 lb

## 2014-10-08 DIAGNOSIS — I071 Rheumatic tricuspid insufficiency: Secondary | ICD-10-CM | POA: Insufficient documentation

## 2014-10-08 DIAGNOSIS — I5032 Chronic diastolic (congestive) heart failure: Secondary | ICD-10-CM

## 2014-10-08 DIAGNOSIS — I340 Nonrheumatic mitral (valve) insufficiency: Secondary | ICD-10-CM | POA: Diagnosis not present

## 2014-10-08 DIAGNOSIS — Z7901 Long term (current) use of anticoagulants: Secondary | ICD-10-CM | POA: Diagnosis not present

## 2014-10-08 DIAGNOSIS — I481 Persistent atrial fibrillation: Secondary | ICD-10-CM

## 2014-10-08 DIAGNOSIS — I482 Chronic atrial fibrillation: Secondary | ICD-10-CM | POA: Diagnosis not present

## 2014-10-08 DIAGNOSIS — I50810 Right heart failure, unspecified: Secondary | ICD-10-CM | POA: Insufficient documentation

## 2014-10-08 DIAGNOSIS — I509 Heart failure, unspecified: Secondary | ICD-10-CM | POA: Diagnosis present

## 2014-10-08 DIAGNOSIS — G35 Multiple sclerosis: Secondary | ICD-10-CM | POA: Insufficient documentation

## 2014-10-08 DIAGNOSIS — I4819 Other persistent atrial fibrillation: Secondary | ICD-10-CM

## 2014-10-08 LAB — BASIC METABOLIC PANEL
Anion gap: 6 (ref 5–15)
BUN: 25 mg/dL — ABNORMAL HIGH (ref 6–23)
CO2: 30 mmol/L (ref 19–32)
Calcium: 9.6 mg/dL (ref 8.4–10.5)
Chloride: 106 mmol/L (ref 96–112)
Creatinine, Ser: 1.14 mg/dL (ref 0.50–1.35)
GFR calc Af Amer: 75 mL/min — ABNORMAL LOW (ref >90)
GFR calc non Af Amer: 65 mL/min — ABNORMAL LOW (ref >90)
Glucose, Bld: 96 mg/dL (ref 70–99)
Potassium: 3.7 mmol/L (ref 3.5–5.1)
Sodium: 142 mmol/L (ref 135–145)

## 2014-10-08 NOTE — Addendum Note (Signed)
Encounter addended by: Renee Pain, RN on: 10/08/2014  2:44 PM<BR>     Documentation filed: Dx Association, Patient Instructions Section, Orders

## 2014-10-08 NOTE — Progress Notes (Signed)
Patient ID: [name], male   DOB: [birth year], 69 y.o.   MRN: [id number] Referring MD : Dr Roxy Manns Cardiologist: Dr [name]  PCP: Dr Zada Girt   HPI: [name] is a 69 year old retired Engineer, production professor with a history of advanced multiple sclerosis, Type A aortic dissection 2000, right heart failure with severe TR and chronic AF referred by [name] for further evaluation for possible TV repair.   He presented acutely on October 15, 1998 with acute type A aortic dissection. He underwent emergency surgical repair with hemi-arch replacement of the ascending thoracic aorta and resuspension of the native aortic valve. 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 CT angiograms of the chest, abdomen and pelvis have remained stable with mild gradual enlargement of the transverse aortic arch and the non-coronary sinus of valsalva. By report, most recent transverse arch dimension has been > 5.5cm. I do not have the scan available to review.  In 2011 developed RHF. TEE in 6/14 reviewed. He has low normal LV function with markedly dilated RA/RV and severe TR. RV function relatively well preserved. Mild to moderate MR. Repeat transthoracic echocardiogram demonstrated stable left ventricular systolic function with ejection fraction estimated 50-55%. There was reportedly mild to moderate mitral regurgitation with left and right atrial chamber enlargement. There was severe tricuspid regurgitation with flow reversal in the hepatic veins. Pulmonary artery pressures were estimated 44 mm mercury.  Admitted in December 2014 with right heart failure including severe bilateral lower extremity edema and generalized anasarca. Diuresed well and was started on Coumadin for long-term anticoagulation because of chronic persistent atrial fibrillation.   After his visit to CHF clinic, Dr  Haroldine Laws discussed his case with [name] who also discussed the situation with Dr. Mali Hughes at [facility] and they felt that heart surgery would be prohibitive due to chronic aortic dissection. Since that time he has also seen Dr. Jae Dire who felt surgery would be high risk due to MS.  Echo in 3/15 actually showed that tricuspid regurgitation looked more moderate; EF 55%, mild AI, aortic root at sinuses of valsalva 4.8 cm, moderate MR, mildly dilated RV moderate TR, PA systolic pressure 46 mmHg.   Follow-up: About 3 weeks ago, [name] increased lasix to 40 bid (from 40/20). He feels it has helped. Weighing every day. Weight staying around 160 at home. Swimming at [employer]. No significant exertional dyspnea at this point.  No chest pain. No edema. No syncope  He is not getting further chest CTs as he would not have another cardiothoracic surgery.   Labs (4/15): K 3.7, creatinine 1.07   PHYSICAL EXAM: Filed Vitals:   10/08/14 1407  BP: 136/64  Pulse: 73  Resp: 20   General:  Walks with cane No respiratory difficulty HEENT: normal Neck: supple. JVP 7 with prominent V waves. Carotids 2+ bilat; no bruits. No lymphadenopathy or thryomegaly appreciated. Cor: PMI nondisplaced. Irregular rate & rhythm. 2/6 HSM LLSB.  Lungs: clear.  Abdomen: soft, nontender, Mildly distended. No hepatosplenomegaly. No bruits or masses. Good bowel sounds. Extremities: no cyanosis, clubbing, rash, No edema.  Neuro: alert & oriented x 3, cranial nerves grossly intact. moves all 4 extremities w/o difficulty. Affect pleasant.  ECHO 11/28/13  - Left ventricle: The cavity size was normal. Wall thickness was increased in a pattern of mild LVH. The estimated ejection fraction was 55%. - Aortic valve: Aortic  Valve is not well seen There is mild AR. - Aorta: There is significant dilatation of the sinus of valsalva.4.8cm There is lots of shadowing artifact in the aortic root Suggest TEE or CT to further evaluate aorta -  Mitral valve: Moderate regurgitation. - Left atrium: The atrium was moderately dilated. - Right ventricle: The cavity size was mildly dilated. Wall thickness was normal. - Right atrium: The atrium was severely dilated. - Atrial septum: No defect or patent foramen ovale was identified. - Tricuspid valve: Moderate regurgitation. - Pulmonary arteries: PA peak pressure: 37mm Hg (S).   ASSESSMENT & PLAN: 1. Right heart failure with severe TR (though less marked on most recent echo in 3/15): Volume status looks stable today. Weight unchanged. NYHA class II symptoms.  Continue current Lasix dosing. He is not a candidate for a tricuspid valve operation.  Focus on symptoms control/volume management. Will check BMET today.  2. Aortic dissection:     --s/p hemi-arch replacement of the ascending thoracic aorta and resuspension of the native aortic valve 2000    --now with chronic dissection and enlarging aneurysm involving sinus of Valsalva and aortic arch    --CTA chest not done this year as he would not have redo surgery.  3. MR: moderate on 3/15 echo.  4. Chronic AF: On warfarin.  5. Severe multiple sclerosis  Followup in 3 months   Benay Spice 10/08/2014

## 2014-10-08 NOTE — Patient Instructions (Signed)
Follow up in 3 months with echocardiogram.  Do the following things EVERYDAY: 1) Weigh yourself in the morning before breakfast. Write it down and keep it in a log. 2) Take your medicines as prescribed 3) Eat low salt foods-Limit salt (sodium) to 2000 mg per day.  4) Stay as active as you can everyday 5) Limit all fluids for the day to less than 2 liters

## 2014-10-09 ENCOUNTER — Ambulatory Visit: Payer: Medicare Other

## 2014-10-09 DIAGNOSIS — R269 Unspecified abnormalities of gait and mobility: Secondary | ICD-10-CM

## 2014-10-09 DIAGNOSIS — R531 Weakness: Secondary | ICD-10-CM

## 2014-10-09 NOTE — Therapy (Signed)
[facility] [address] [place], [place], [address] Phone: [phone]   Fax:  [phone]  Physical Therapy Treatment  Patient Details  Name: [name] MRN: [id number] Date of Birth: [birth year] Referring Provider:  Criselda Peaches, MD  Encounter Date: 10/09/2014      PT End of Session - 10/09/14 1651    Visit Number 6   Number of Visits 17   Date for PT Re-Evaluation 11/09/14   Authorization Type G-code every 10th visit.   PT Start Time 1406   PT Stop Time 1445   PT Time Calculation (min) 39 min   Equipment Utilized During Treatment Gait belt   Activity Tolerance Patient tolerated treatment well   Behavior During Therapy WFL for tasks assessed/performed      Past Medical History  Diagnosis Date  . Neuromuscular disorder   . Hypertension   . Anemia   . Depression   . Multiple sclerosis   . Thrombocytopenia   . Dyslipidemia   . Aortic dissection, thoracic   . BPH (benign prostatic hyperplasia)   . Hemorrhoids   . Aortic dissection 11/05/1998    S/P emergency repair of acute type A aortic dissection with resuspension of native aortic valve  . 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 CHF (congestive heart failure) 08-22-47    2D Echo - EF 50-55%, mild-moderately dilated right ventricle, mild-moderate tricuspid valve regurgitation, moderately dilated right atrium  . Bilateral lower extremity edema   . Atrial fibrillation 01/30/2013    Atrial fibrillation   . Pleural effusion, right, large 01/30/2013  . Severe tricuspid valve regurgitation 01/30/2013    Severe tricuspid regurg by recent 2-D echo with a dilated tricuspid annulus, moderate pulmonary hypertension and biatrial enlargement   . Mitral regurgitation 02/20/2013  . Exertional shortness of breath     "for awhile now"  (09/11/2013)  . History of blood transfusion     Past Surgical History  Procedure Laterality Date  . Hemorrhoid surgery  2009    Internal, external hemorrhoidectomy, general anesthesia,prone position. [Other]  . Open reduction and internal fixation of right distal radius fracture using hand innovations distal radius volar locking plate.  07/2005    Dr Ninfa Linden  . Tee without cardioversion N/A 02/17/2013    Procedure: TRANSESOPHAGEAL ECHOCARDIOGRAM (TEE);  Surgeon: [name], MD;  Location: [facility] ENDOSCOPY;  Service: Cardiovascular;  Laterality: N/A;  . Repair of acute type a aortic dissection with resuspension of native aortic valve  10/15/1998    Dr Roxy Manns    There were no vitals taken for this visit.  Visit Diagnosis:  Abnormality of gait  Weakness generalized      Subjective Assessment - 10/09/14 1410    Symptoms Pt denied falls or changes since last visit.    Patient Stated Goals walk better, go for longer walks in the park without walker, walk faster   Currently in Pain? No/denies                    [facility] Adult PT Treatment/Exercise - 10/09/14 1450    Standardized Balance Assessment   Standardized Balance Assessment Berg Balance Test;Timed Up and Go Test   Berg Balance Test   Sit to Stand Able to stand  independently using hands   Standing Unsupported Able to stand 2 minutes with supervision   Sitting with Back  Unsupported but Feet Supported on Floor or Stool Able to sit safely and securely 2 minutes   Stand to Sit Sits safely with minimal use of hands   Transfers Able to transfer with verbal cueing and /or supervision   Standing Unsupported with Eyes Closed Able to stand 10 seconds with supervision   Standing Ubsupported with Feet Together Needs help to attain position but able to stand for 30 seconds with feet together   From Standing, Reach Forward with Outstretched Arm Can reach confidently >25 cm (10")  10"   From Standing Position, Pick up Object from  Floor Able to pick up shoe, needs supervision   From Standing Position, Turn to Look Behind Over each Shoulder Turn sideways only but maintains balance   Turn 360 Degrees Needs close supervision or verbal cueing   Standing Unsupported, Alternately Place Feet on Step/Stool Needs assistance to keep from falling or unable to try   Standing Unsupported, One Foot in [place] to take small step independently and hold 30 seconds   Standing on One Leg Tries to lift leg/unable to hold 3 seconds but remains standing independently   Total Score 33   Timed Up and Go Test   TUG Normal TUG   Normal TUG (seconds) 23.69  with rollator      Pt required frequent seated rest breaks during the BERG balance test.            PT Short Term Goals - 10/09/14 1653    PT SHORT TERM GOAL #1   Title Pt independent in HEP to improve functional mobility. Target date: 10/09/14.   Status On-going   PT SHORT TERM GOAL #2   Title Perform BERG and write appropriate STG and LTG. Target date: 10/09/14.   Status Achieved   PT SHORT TERM GOAL #3   Title Pt will perform TUG, with LRAD, in <23 second to decrease falls risk. Target date: 10/09/14.   Baseline 23.69 seconds with rollator on 10/09/14.   Status Partially Met   PT SHORT TERM GOAL #4   Title Pt will ambulate 300' with LRAD over even/uneven terrain without LOB to improve functional mobility. Target date: 10/09/14.   Status On-going   PT SHORT TERM GOAL #5   Title Pt will report he ambulated at the park one time/week, to improve quality of life. Target date: 10/09/14.   Baseline due to cold/icy weather.   Status Not Met   PT SHORT TERM GOAL #6   Title Pt will improve BERG balance score to >/=33/56 to decrease falls risk. Target date: 10/09/14.   Baseline 33/56   Status Achieved           PT Long Term Goals - 10/04/14 1603    PT LONG TERM GOAL #1   Title Pt will verbalize undertsanding and agreement of falls prevention strategies. Target date: 11/06/14.    Status On-going   PT LONG TERM GOAL #2   Title Pt will improve gait speed with LRAD to >/=1.25f/sec. to improve functional mobility. Target date: 11/06/14.   Status On-going   PT LONG TERM GOAL #3   Title Pt will ambulate 500' over even/uneven terrain with LRAD to improve functional mobility. Target date: 11/06/14.   Status On-going   PT LONG TERM GOAL #4   Title Pt will perform TUG in <19 seconds, with LRAD, to decrease falls risk. Target date: 11/06/14.   Status On-going   PT LONG TERM GOAL #5   Title Pt will improve  BERG balance score to >/=37/56 to decrease falls risk. Target date: 11/06/14.   Status On-going               Plan - 10/09/14 1659    Clinical Impression Statement Pt demonstrated progress, as he met  BERG balance goal. Pt was close to meeting TUG goal (<23 seconds) as he peformed TUG in 23.69 seconds, which still indicates he is at a risk for falls. Pt would continue to benefit from skilled PT to improve safety during functional mobility.   Pt will benefit from skilled therapeutic intervention in order to improve on the following deficits Abnormal gait;Decreased endurance;Decreased knowledge of use of DME;Decreased balance;Decreased strength;Decreased mobility   Rehab Potential Good   PT Frequency 2x / week   PT Duration 8 weeks   PT Treatment/Interventions ADLs/Self Care Home Management;Gait training;Neuromuscular re-education;Stair training;Functional mobility training;Patient/family education;Therapeutic activities;Therapeutic exercise;Electrical Stimulation;Manual techniques;Balance training;DME Instruction   PT Next Visit Plan Finishing assessing STGs.   Consulted and Agree with Plan of Care Patient        Problem List Patient Active Problem List   Diagnosis Date Noted  . Tricuspid regurgitation 10/08/2014  . RVF (right ventricular failure) 10/08/2014  . Chronic diastolic congestive heart failure 10/09/2013  . Sinus pause 09/16/2013  . Anemia due to  blood loss, acute in setting of supratheraputic INR 09/11/13 09/12/2013  . 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 Acute renal insufficiency 09/12/2013  . Anasarca 09/12/2013  . Anemia 09/12/2013  . Bilateral leg edema 09/11/2013  . Acute right-sided congestive heart failure 09/11/2013  . Unstable gait 09/11/2013  . Long term (current) use of anticoagulants 04/11/2013  . Dissection of aorta, thoracic- surgery Feb 2000 02/20/2013  . Mitral regurgitation 02/20/2013  . Lower extremity edema 02/20/2013  . Persistent atrial fibrillation 01/30/2013  . Volume overload 01/30/2013  . Severe tricuspid valve regurgitation 01/30/2013  . Pleural effusion, right, large 01/30/2013  . MS (multiple sclerosis) 01/13/2013  . Thrombocytopenia 07/26/2011  . Aortic dissection- chronic abd dissection  11/05/1998    Delrae Sawyers D 10/09/2014, 5:01 PM  [facility] [address] [place], [place], [address] Phone: [phone]   Fax:  [phone]  Theressa Millard, PT brought pt back to rehab gym (from [phone]) as primary PT was finishing up with a previous pt. Geoffry Paradise, PT was with pt from [phone].   Geoffry Paradise, PT,DPT 10/09/2014 5:01 PM Phone: [phone] Fax: [phone]

## 2014-10-10 ENCOUNTER — Ambulatory Visit (INDEPENDENT_AMBULATORY_CARE_PROVIDER_SITE_OTHER): Payer: Medicare Other | Admitting: Pharmacist Clinician (PhC)/ Clinical Pharmacy Specialist

## 2014-10-10 DIAGNOSIS — I4891 Unspecified atrial fibrillation: Secondary | ICD-10-CM

## 2014-10-10 DIAGNOSIS — Z7901 Long term (current) use of anticoagulants: Secondary | ICD-10-CM

## 2014-10-10 DIAGNOSIS — I481 Persistent atrial fibrillation: Secondary | ICD-10-CM

## 2014-10-10 DIAGNOSIS — I4819 Other persistent atrial fibrillation: Secondary | ICD-10-CM

## 2014-10-10 LAB — POCT INR: INR: 2.7

## 2014-10-11 ENCOUNTER — Other Ambulatory Visit (HOSPITAL_COMMUNITY): Payer: Self-pay | Admitting: *Deleted

## 2014-10-11 ENCOUNTER — Ambulatory Visit: Payer: Medicare Other

## 2014-10-11 DIAGNOSIS — I5032 Chronic diastolic (congestive) heart failure: Secondary | ICD-10-CM

## 2014-10-11 DIAGNOSIS — R531 Weakness: Secondary | ICD-10-CM

## 2014-10-11 DIAGNOSIS — R269 Unspecified abnormalities of gait and mobility: Secondary | ICD-10-CM

## 2014-10-11 MED ORDER — FUROSEMIDE 40 MG PO TABS: 40.0000 mg | ORAL_TABLET | Freq: Two times a day (BID) | ORAL | Status: DC

## 2014-10-11 NOTE — Therapy (Signed)
[facility] [address] [place], [place], [address] Phone: [phone]   Fax:  [phone]  Physical Therapy Treatment  Patient Details  Name: [name] MRN: [id number] Date of Birth: 1946-03-01 Referring Provider:  Criselda Peaches, MD  Encounter Date: 10/11/2014      PT End of Session - 10/11/14 1516    Visit Number 7   Number of Visits 17   Date for PT Re-Evaluation 11/09/14   Authorization Type G-code every 10th visit.   PT Start Time 1401   PT Stop Time 1445   PT Time Calculation (min) 44 min   Equipment Utilized During Treatment Gait belt   Activity Tolerance Patient tolerated treatment well   Behavior During Therapy WFL for tasks assessed/performed      Past Medical History  Diagnosis Date  . Neuromuscular disorder   . Hypertension   . Anemia   . Depression   . Multiple sclerosis   . Thrombocytopenia   . Dyslipidemia   . Aortic dissection, thoracic   . BPH (benign prostatic hyperplasia)   . Hemorrhoids   . Aortic dissection 11/05/1998    S/P emergency repair of acute type A aortic dissection with resuspension of native aortic valve  . 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 CHF (congestive heart failure) 03/08/47    2D Echo - EF 50-55%, mild-moderately dilated right ventricle, mild-moderate tricuspid valve regurgitation, moderately dilated right atrium  . Bilateral lower extremity edema   . Atrial fibrillation 01/30/2013    Atrial fibrillation   . Pleural effusion, right, large 01/30/2013  . Severe tricuspid valve regurgitation 01/30/2013    Severe tricuspid regurg by recent 2-D echo with a dilated tricuspid annulus, moderate pulmonary hypertension and biatrial enlargement   . Mitral regurgitation 02/20/2013  . Exertional shortness of breath     "for awhile now"  (09/11/2013)  . History of blood transfusion     Past Surgical History  Procedure Laterality Date  . Hemorrhoid surgery  2009    Internal, external hemorrhoidectomy, general anesthesia,prone position. [Other]  . Open reduction and internal fixation of right distal radius fracture using hand innovations distal radius volar locking plate.  07/2005    Dr Ninfa Linden  . Tee without cardioversion N/A 02/17/2013    Procedure: TRANSESOPHAGEAL ECHOCARDIOGRAM (TEE);  Surgeon: [name], MD;  Location: [facility] ENDOSCOPY;  Service: Cardiovascular;  Laterality: N/A;  . Repair of acute type a aortic dissection with resuspension of native aortic valve  10/15/1998    Dr Roxy Manns    There were no vitals taken for this visit.  Visit Diagnosis:  Abnormality of gait  Weakness generalized      Subjective Assessment - 10/11/14 1406    Symptoms Pt denied falls or changes since last visit.   Patient Stated Goals walk better, go for longer walks in the park without walker, walk faster   Currently in Pain? No/denies                    [facility] Adult PT Treatment/Exercise - 10/11/14 1407    Ambulation/Gait   Ambulation/Gait Yes   Ambulation/Gait Assistance 5: Supervision   Ambulation/Gait Assistance Details Pt ambulated over even/uneven terrain with rollator and did not experience LOB episodes. Pt ambulated over even terrain with SPC, with min guard to min A while turning to prevent LOB. VC's to  improve R knee flexion and to improve sequencing.   Ambulation Distance (Feet) --  65' and 108' with rollator and 71' with [facility]   Assistive device Rollator   Gait Pattern Decreased hip/knee flexion - right;Decreased dorsiflexion - right;Right circumduction;Lateral trunk lean to left;Poor foot clearance - right   Ambulation Surface Level;Unlevel;Indoor   Ramp Other (comment)  min guard. Ascended/descended ramp x1, cues for safety.   Ramp Details (indicate cue type and reason) pt used rollator for ramp      Therex: reviewed HEP  Sit<>stands x3 with no UE support. -Bridges in supine x10, with glut max and TrA activation. VC's for technique. -Supine hip marches with TrA activated; x10/LE. VC's to improve technique and eccentric control. -B heel slides x10 in supine, with towel placed under R LE to decrease friction. Pt required assist to initiate knee flexion due to increased extensor tone.  Pt required rest breaks after each set due to fatigue.           PT Education - 10/11/14 1510    Education provided Yes   Education Details Reviewed strengthening HEP and provided pt with walking in park instructions.   Person(s) Educated Patient   Methods Explanation;Verbal cues   Comprehension Verbalized understanding;Returned demonstration          PT Short Term Goals - 10/11/14 1519    PT SHORT TERM GOAL #1   Title Pt independent in HEP to improve functional mobility. Target date: 10/09/14.   Status Partially Met   PT SHORT TERM GOAL #2   Title Perform BERG and write appropriate STG and LTG. Target date: 10/09/14.   Status Achieved   PT SHORT TERM GOAL #3   Title Pt will perform TUG, with LRAD, in <23 second to decrease falls risk. Target date: 10/09/14.   Baseline 23.69 seconds with rollator on 10/09/14.   Status Partially Met   PT SHORT TERM GOAL #4   Title Pt will ambulate 300' with LRAD over even/uneven terrain without LOB to improve functional mobility. Target date: 10/09/14.   Status Achieved   PT SHORT TERM GOAL #5   Title Pt will report he ambulated at the park one time/week, to improve quality of life. Target date: 10/09/14.   Baseline due to cold/icy weather.   Status Not Met   PT SHORT TERM GOAL #6   Title Pt will improve BERG balance score to >/=33/56 to decrease falls risk. Target date: 10/09/14.   Baseline 33/56   Status Achieved           PT Long Term Goals - 10/04/14 1603    PT LONG TERM GOAL #1   Title Pt will verbalize undertsanding and agreement of falls  prevention strategies. Target date: 11/06/14.   Status On-going   PT LONG TERM GOAL #2   Title Pt will improve gait speed with LRAD to >/=1.61f/sec. to improve functional mobility. Target date: 11/06/14.   Status On-going   PT LONG TERM GOAL #3   Title Pt will ambulate 500' over even/uneven terrain with LRAD to improve functional mobility. Target date: 11/06/14.   Status On-going   PT LONG TERM GOAL #4   Title Pt will perform TUG in <19 seconds, with LRAD, to decrease falls risk. Target date: 11/06/14.   Status On-going   PT LONG TERM GOAL #5   Title Pt will improve BERG balance score to >/=37/56 to decrease falls risk. Target date: 11/06/14.   Status On-going  Plan - 10/11/14 1516    Clinical Impression Statement Pt demonstrating progress, as he met STG #4, and partially met STG #1 as he still required cues for several therex. Pt continues to be limited by decreased R LE strength and endurance, as he required frequent seated rest breaks. Pt also requires continual cuing to redirect pt to task. Pt woul continue to benefit from skilled PT to improve safety during functional mobility.   Pt will benefit from skilled therapeutic intervention in order to improve on the following deficits Abnormal gait;Decreased endurance;Decreased knowledge of use of DME;Decreased balance;Decreased strength;Decreased mobility   Rehab Potential Good   PT Frequency 2x / week   PT Duration 8 weeks   PT Treatment/Interventions ADLs/Self Care Home Management;Gait training;Neuromuscular re-education;Stair training;Functional mobility training;Patient/family education;Therapeutic activities;Therapeutic exercise;Electrical Stimulation;Manual techniques;Balance training;DME Instruction   PT Next Visit Plan Progress ambulation with SPC, progress balance training.    Consulted and Agree with Plan of Care Patient        Problem List Patient Active Problem List   Diagnosis Date Noted  . Tricuspid  regurgitation 10/08/2014  . RVF (right ventricular failure) 10/08/2014  . Chronic diastolic congestive heart failure 10/09/2013  . Sinus pause 09/16/2013  . Anemia due to blood loss, acute in setting of supratheraputic INR 09/11/13 09/12/2013  . 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 Acute renal insufficiency 09/12/2013  . Anasarca 09/12/2013  . Anemia 09/12/2013  . Bilateral leg edema 09/11/2013  . Acute right-sided congestive heart failure 09/11/2013  . Unstable gait 09/11/2013  . Long term (current) use of anticoagulants 04/11/2013  . Dissection of aorta, thoracic- surgery Feb 2000 02/20/2013  . Mitral regurgitation 02/20/2013  . Lower extremity edema 02/20/2013  . Persistent atrial fibrillation 01/30/2013  . Volume overload 01/30/2013  . Severe tricuspid valve regurgitation 01/30/2013  . Pleural effusion, right, large 01/30/2013  . MS (multiple sclerosis) 01/13/2013  . Thrombocytopenia 07/26/2011  . Aortic dissection- chronic abd dissection  11/05/1998    Sharran Caratachea L 10/11/2014, 3:20 PM  [facility] [address] [place], [place], [address] Phone: [phone]   Fax:  [phone]    Geoffry Paradise, PT,DPT 10/11/2014 3:20 PM Phone: [phone] Fax: [phone]

## 2014-10-11 NOTE — Patient Instructions (Addendum)
Walk around park, for at least 10 minutes, taking rest breaks as needed. Minimum once per week, increasing the frequency as tolerable.

## 2014-10-15 ENCOUNTER — Other Ambulatory Visit (HOSPITAL_COMMUNITY): Payer: Self-pay

## 2014-10-15 DIAGNOSIS — I5032 Chronic diastolic (congestive) heart failure: Secondary | ICD-10-CM

## 2014-10-15 MED ORDER — FUROSEMIDE 40 MG PO TABS: 40.0000 mg | ORAL_TABLET | Freq: Two times a day (BID) | ORAL | Status: DC

## 2014-10-16 ENCOUNTER — Ambulatory Visit: Payer: Medicare Other | Attending: Diagnostic Neuroimaging

## 2014-10-16 DIAGNOSIS — R269 Unspecified abnormalities of gait and mobility: Secondary | ICD-10-CM | POA: Insufficient documentation

## 2014-10-16 DIAGNOSIS — Z9181 History of falling: Secondary | ICD-10-CM | POA: Insufficient documentation

## 2014-10-16 DIAGNOSIS — R531 Weakness: Secondary | ICD-10-CM | POA: Diagnosis not present

## 2014-10-16 NOTE — Therapy (Signed)
[facility] [address] [place], [place], [address] Phone: [phone]   Fax:  [phone]  Physical Therapy Treatment  Patient Details  Name: [name] MRN: [id number] Date of Birth: 08-28-1946 Referring Provider:  Criselda Peaches, MD  Encounter Date: 10/16/2014      PT End of Session - 10/16/14 1635    Visit Number 8   Number of Visits 17   Date for PT Re-Evaluation 11/09/14   Authorization Type G-code every 10th visit.   PT Start Time 1412   PT Stop Time 1501   PT Time Calculation (min) 49 min   Activity Tolerance Patient tolerated treatment well   Behavior During Therapy WFL for tasks assessed/performed      Past Medical History  Diagnosis Date  . Neuromuscular disorder   . Hypertension   . Anemia   . Depression   . Multiple sclerosis   . Thrombocytopenia   . Dyslipidemia   . Aortic dissection, thoracic   . BPH (benign prostatic hyperplasia)   . Hemorrhoids   . Aortic dissection 11/05/1998    S/P emergency repair of acute type A aortic dissection with resuspension of native aortic valve  . 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 CHF (congestive heart failure) Jun 02, 1946    2D Echo - EF 50-55%, mild-moderately dilated right ventricle, mild-moderate tricuspid valve regurgitation, moderately dilated right atrium  . Bilateral lower extremity edema   . Atrial fibrillation 01/30/2013    Atrial fibrillation   . Pleural effusion, right, large 01/30/2013  . Severe tricuspid valve regurgitation 01/30/2013    Severe tricuspid regurg by recent 2-D echo with a dilated tricuspid annulus, moderate pulmonary hypertension and biatrial enlargement   . Mitral regurgitation 02/20/2013  . Exertional shortness of breath     "for awhile now" (09/11/2013)  . History of blood transfusion      Past Surgical History  Procedure Laterality Date  . Hemorrhoid surgery  2009    Internal, external hemorrhoidectomy, general anesthesia,prone position. [Other]  . Open reduction and internal fixation of right distal radius fracture using hand innovations distal radius volar locking plate.  07/2005    Dr Ninfa Linden  . Tee without cardioversion N/A 02/17/2013    Procedure: TRANSESOPHAGEAL ECHOCARDIOGRAM (TEE);  Surgeon: [name], MD;  Location: [facility] ENDOSCOPY;  Service: Cardiovascular;  Laterality: N/A;  . Repair of acute type a aortic dissection with resuspension of native aortic valve  10/15/1998    Dr Roxy Manns    There were no vitals taken for this visit.  Visit Diagnosis:  Abnormality of gait  Weakness generalized      Subjective Assessment - 10/16/14 1415    Symptoms Pt arrived 6 minutes late, and then used the bathroom prior to beginning PT session. Pt denied falls or changes since last visit.    Patient Stated Goals walk better, go for longer walks in the park without walker, walk faster   Currently in Pain? No/denies       Orthotic training: Gerald Stabs, from [facility], present during session. AFO donned/doffed by orthotist, while educating pt on the process. Using rollator over even terrain: Pt ambulated 52' with R semi solid AFO donned and 1/4" heel wedge. Pt ambulated 200' with R semi solid AFO and 1/2" heel wedge. Pt ambulated 75' without AFO donned. VC's to improve upright posture, decrease R LE circumduction, improve  R knee/hip flexion. PT and orthotist spent extensive time explaining different orthotics and benefits to improve R foot clearance, decrease R genu varum and R genu recurvatum. Orthotist also recommended the potential use of an OA knee brace to correct genu varum and recurvatum, as well as a Swedish knee cage.                     PT Education - 10/16/14 1634    Education provided Yes   Education Details PT and orthotist Gerald Stabs from [facility]) educated  pt on use of AFO and different orthotics to improve R foot clearance, while decreasing R genu varus and R knee hyperextension.    Person(s) Educated Patient   Methods Explanation;Demonstration   Comprehension Verbalized understanding          PT Short Term Goals - 10/11/14 1519    PT SHORT TERM GOAL #1   Title Pt independent in HEP to improve functional mobility. Target date: 10/09/14.   Status Partially Met   PT SHORT TERM GOAL #2   Title Perform BERG and write appropriate STG and LTG. Target date: 10/09/14.   Status Achieved   PT SHORT TERM GOAL #3   Title Pt will perform TUG, with LRAD, in <23 second to decrease falls risk. Target date: 10/09/14.   Baseline 23.69 seconds with rollator on 10/09/14.   Status Partially Met   PT SHORT TERM GOAL #4   Title Pt will ambulate 300' with LRAD over even/uneven terrain without LOB to improve functional mobility. Target date: 10/09/14.   Status Achieved   PT SHORT TERM GOAL #5   Title Pt will report he ambulated at the park one time/week, to improve quality of life. Target date: 10/09/14.   Baseline due to cold/icy weather.   Status Not Met   PT SHORT TERM GOAL #6   Title Pt will improve BERG balance score to >/=33/56 to decrease falls risk. Target date: 10/09/14.   Baseline 33/56   Status Achieved           PT Long Term Goals - 10/04/14 1603    PT LONG TERM GOAL #1   Title Pt will verbalize undertsanding and agreement of falls prevention strategies. Target date: 11/06/14.   Status On-going   PT LONG TERM GOAL #2   Title Pt will improve gait speed with LRAD to >/=1.110f/sec. to improve functional mobility. Target date: 11/06/14.   Status On-going   PT LONG TERM GOAL #3   Title Pt will ambulate 500' over even/uneven terrain with LRAD to improve functional mobility. Target date: 11/06/14.   Status On-going   PT LONG TERM GOAL #4   Title Pt will perform TUG in <19 seconds, with LRAD, to decrease falls risk. Target date: 11/06/14.   Status  On-going   PT LONG TERM GOAL #5   Title Pt will improve BERG balance score to >/=37/56 to decrease falls risk. Target date: 11/06/14.   Status On-going               Plan - 10/16/14 1635    Clinical Impression Statement Pt continues to experience R genu recurvatum and genu varus during stance while ambulating. These impairments were improve with R semi solid AFO donned with 1/2" heel wedge, pt was also demonstrated improved R foot clearance with AFO donned. Pt would most likely require a custom R solid AFO with a wedge (decrease R genu recurvatum) and lateral forces to counter R genu varum. Pt is deciding  if he would like to purchase an AFO, and will inform PT of his decision. Continue with POC.   Rehab Potential Good   PT Frequency 2x / week   PT Duration 8 weeks   PT Treatment/Interventions ADLs/Self Care Home Management;Gait training;Neuromuscular re-education;Stair training;Functional mobility training;Patient/family education;Therapeutic activities;Therapeutic exercise;Electrical Stimulation;Manual techniques;Balance training;DME Instruction   PT Next Visit Plan Progress ambulation with SPC vs. rollator, progress balance training and R LE strength training as tolerated.   Consulted and Agree with Plan of Care Patient        Problem List Patient Active Problem List   Diagnosis Date Noted  . Tricuspid regurgitation 10/08/2014  . RVF (right ventricular failure) 10/08/2014  . Chronic diastolic congestive heart failure 10/09/2013  . Sinus pause 09/16/2013  . Anemia due to blood loss, acute in setting of supratheraputic INR 09/11/13 09/12/2013  . 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 Acute renal insufficiency 09/12/2013  . Anasarca 09/12/2013  . Anemia 09/12/2013  . Bilateral leg edema 09/11/2013  . Acute right-sided congestive heart failure 09/11/2013  . Unstable gait 09/11/2013  . Long term (current) use of anticoagulants 04/11/2013  . Dissection  of aorta, thoracic- surgery Feb 2000 02/20/2013  . Mitral regurgitation 02/20/2013  . Lower extremity edema 02/20/2013  . Persistent atrial fibrillation 01/30/2013  . Volume overload 01/30/2013  . Severe tricuspid valve regurgitation 01/30/2013  . Pleural effusion, right, large 01/30/2013  . MS (multiple sclerosis) 01/13/2013  . Thrombocytopenia 07/26/2011  . Aortic dissection- chronic abd dissection  11/05/1998    Stepan Verrette L 10/16/2014, 4:39 PM  [facility] [address] [place], [place], [address] Phone: [phone]   Fax:  [phone]     Geoffry Paradise, PT,DPT 10/16/2014 4:39 PM Phone: [phone] Fax: [phone]

## 2014-10-17 ENCOUNTER — Telehealth: Payer: Self-pay

## 2014-10-17 NOTE — Telephone Encounter (Signed)
PT spoke with pt to confirm that he would like to proceed with AFO. PT will contact pt's MD for AFO prescription and then send prescription to [facility] [place].

## 2014-10-18 ENCOUNTER — Other Ambulatory Visit: Payer: Self-pay | Admitting: *Deleted

## 2014-10-19 ENCOUNTER — Encounter: Payer: Self-pay | Admitting: Physical Therapy

## 2014-10-19 ENCOUNTER — Ambulatory Visit: Payer: Medicare Other | Admitting: Physical Therapy

## 2014-10-19 DIAGNOSIS — R531 Weakness: Secondary | ICD-10-CM

## 2014-10-19 DIAGNOSIS — R269 Unspecified abnormalities of gait and mobility: Secondary | ICD-10-CM | POA: Diagnosis not present

## 2014-10-19 NOTE — Therapy (Addendum)
[facility] [address] [place], [place], [address] Phone: [phone]   Fax:  [phone]  Physical Therapy Treatment  Patient Details  Name: [name] MRN: [id number] Date of Birth: 04-06-1946 Referring Provider:  Criselda Peaches, MD  Encounter Date: 10/19/2014      PT End of Session - 10/19/14 1537    Visit Number 9   Number of Visits 17   Date for PT Re-Evaluation 11/09/14   Authorization Type G-code every 10th visit.   PT Start Time 1532   PT Stop Time 1613   PT Time Calculation (min) 41 min   Equipment Utilized During Treatment Gait belt   Activity Tolerance Patient tolerated treatment well   Behavior During Therapy WFL for tasks assessed/performed      Past Medical History  Diagnosis Date  . Neuromuscular disorder   . Hypertension   . Anemia   . Depression   . Multiple sclerosis   . Thrombocytopenia   . Dyslipidemia   . Aortic dissection, thoracic   . BPH (benign prostatic hyperplasia)   . Hemorrhoids   . Aortic dissection 11/05/1998    S/P emergency repair of acute type A aortic dissection with resuspension of native aortic valve  . 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 CHF (congestive heart failure) [birth year] 29, 1945    2D Echo - EF 50-55%, mild-moderately dilated right ventricle, mild-moderate tricuspid valve regurgitation, moderately dilated right atrium  . Bilateral lower extremity edema   . Atrial fibrillation 01/30/2013    Atrial fibrillation   . Pleural effusion, right, large 01/30/2013  . Severe tricuspid valve regurgitation 01/30/2013    Severe tricuspid regurg by recent 2-D echo with a dilated tricuspid annulus, moderate pulmonary hypertension and biatrial enlargement   . Mitral regurgitation 02/20/2013  . Exertional shortness of breath     "for awhile now"  (09/11/2013)  . History of blood transfusion     Past Surgical History  Procedure Laterality Date  . Hemorrhoid surgery  2009    Internal, external hemorrhoidectomy, general anesthesia,prone position. [Other]  . Open reduction and internal fixation of right distal radius fracture using hand innovations distal radius volar locking plate.  07/2005    Dr Ninfa Linden  . Tee without cardioversion N/A 02/17/2013    Procedure: TRANSESOPHAGEAL ECHOCARDIOGRAM (TEE);  Surgeon: [name], MD;  Location: [facility] ENDOSCOPY;  Service: Cardiovascular;  Laterality: N/A;  . Repair of acute type a aortic dissection with resuspension of native aortic valve  10/15/1998    Dr Roxy Manns    There were no vitals taken for this visit.  Visit Diagnosis:  Abnormality of gait  Weakness generalized      Subjective Assessment - 10/19/14 1536    Symptoms No new complaints. No pain or falls to report.   Currently in Pain? No/denies     Treatment: Gait - 115 feet with rolator with min guard assist. Cues on posture, stride length and right foot clearance.   - 115 feet with cane with min assist    Exercise: Standing with chair support - heel raises x 10 reps - alternating high knee marches and hip abduction x 10 reps each bil legs - mini squats x 10 reps   sit/stands x 10 reps with minimal UE assist, cues for anterior weight shift to assist with standing.  Neuro Re-ed: - alternating forward toe taps  to 4 inch box x 10 reps each leg. Min to mod assist for balance. - toe taps to foam bubbles ( 3 in lateral left-middle-lateral right position) x 3 laps each foot. Min to mod assist for balance.         PT Short Term Goals - 10/11/14 1519    PT SHORT TERM GOAL #1   Title Pt independent in HEP to improve functional mobility. Target date: 10/09/14.   Status Partially Met   PT SHORT TERM GOAL #2   Title Perform BERG and write appropriate STG and LTG. Target date: 10/09/14.   Status Achieved   PT SHORT TERM GOAL  #3   Title Pt will perform TUG, with LRAD, in <23 second to decrease falls risk. Target date: 10/09/14.   Baseline 23.69 seconds with rollator on 10/09/14.   Status Partially Met   PT SHORT TERM GOAL #4   Title Pt will ambulate 300' with LRAD over even/uneven terrain without LOB to improve functional mobility. Target date: 10/09/14.   Status Achieved   PT SHORT TERM GOAL #5   Title Pt will report he ambulated at the park one time/week, to improve quality of life. Target date: 10/09/14.   Baseline due to cold/icy weather.   Status Not Met   PT SHORT TERM GOAL #6   Title Pt will improve BERG balance score to >/=33/56 to decrease falls risk. Target date: 10/09/14.   Baseline 33/56   Status Achieved           PT Long Term Goals - 10/04/14 1603    PT LONG TERM GOAL #1   Title Pt will verbalize undertsanding and agreement of falls prevention strategies. Target date: 11/06/14.   Status On-going   PT LONG TERM GOAL #2   Title Pt will improve gait speed with LRAD to >/=1.19ft/sec. to improve functional mobility. Target date: 11/06/14.   Status On-going   PT LONG TERM GOAL #3   Title Pt will ambulate 500' over even/uneven terrain with LRAD to improve functional mobility. Target date: 11/06/14.   Status On-going   PT LONG TERM GOAL #4   Title Pt will perform TUG in <19 seconds, with LRAD, to decrease falls risk. Target date: 11/06/14.   Status On-going   PT LONG TERM GOAL #5   Title Pt will improve BERG balance score to >/=37/56 to decrease falls risk. Target date: 11/06/14.   Status On-going           Plan - 10/19/14 1538    Clinical Impression Statement Pt making steady progress toward goals. No complaints with session today. Did demo decreased unsupported balance reactions with balance activities today.   Pt will benefit from skilled therapeutic intervention in order to improve on the following deficits Abnormal gait;Decreased endurance;Decreased knowledge of use of DME;Decreased  balance;Decreased strength;Decreased mobility   Rehab Potential Good   PT Frequency 2x / week   PT Duration 8 weeks   PT Treatment/Interventions ADLs/Self Care Home Management;Gait training;Neuromuscular re-education;Stair training;Functional mobility training;Patient/family education;Therapeutic activities;Therapeutic exercise;Electrical Stimulation;Manual techniques;Balance training;DME Instruction   PT Next Visit Plan Progress ambulation with SPC, progress balance training.    Consulted and Agree with Plan of Care Patient      Problem List Patient Active Problem List   Diagnosis Date Noted  . Tricuspid regurgitation 10/08/2014  . RVF (right ventricular failure) 10/08/2014  . Chronic diastolic congestive heart failure 10/09/2013  . Sinus pause 09/16/2013  . Anemia due to blood loss, acute in setting of  supratheraputic INR 09/11/13 09/12/2013  . 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 Acute renal insufficiency 09/12/2013  . Anasarca 09/12/2013  . Anemia 09/12/2013  . Bilateral leg edema 09/11/2013  . Acute right-sided congestive heart failure 09/11/2013  . Unstable gait 09/11/2013  . Long term (current) use of anticoagulants 04/11/2013  . Dissection of aorta, thoracic- surgery Feb 2000 02/20/2013  . Mitral regurgitation 02/20/2013  . Lower extremity edema 02/20/2013  . Persistent atrial fibrillation 01/30/2013  . Volume overload 01/30/2013  . Severe tricuspid valve regurgitation 01/30/2013  . Pleural effusion, right, large 01/30/2013  . MS (multiple sclerosis) 01/13/2013  . Thrombocytopenia 07/26/2011  . Aortic dissection- chronic abd dissection  11/05/1998    [name] 10/19/2014, 6:07 PM  [name], PTA, [facility] [address] [place], [place] [address] [phone] 10/19/2014, 6:07 PM

## 2014-10-23 ENCOUNTER — Ambulatory Visit: Payer: Medicare Other | Admitting: Physical Therapy

## 2014-10-23 DIAGNOSIS — R269 Unspecified abnormalities of gait and mobility: Secondary | ICD-10-CM

## 2014-10-23 DIAGNOSIS — R531 Weakness: Secondary | ICD-10-CM

## 2014-10-24 NOTE — Therapy (Signed)
[facility] [address] [place], [place], [address] Phone: [phone]   Fax:  [phone]  Physical Therapy Treatment  Patient Details  Name: [name] MRN: [id number] Date of Birth: 10-04-1945 Referring Provider:  Criselda Peaches, MD  Encounter Date: 10/23/2014      PT End of Session - 10/24/14 6283    Visit Number 10   Number of Visits 17   Date for PT Re-Evaluation 11/09/14   Authorization Type G-code every 10th visit.   PT Start Time 1406   PT Stop Time 1448   PT Time Calculation (min) 42 min   Equipment Utilized During Treatment Gait belt   Activity Tolerance Patient tolerated treatment well   Behavior During Therapy WFL for tasks assessed/performed      Past Medical History  Diagnosis Date  . Neuromuscular disorder   . Hypertension   . Anemia   . Depression   . Multiple sclerosis   . Thrombocytopenia   . Dyslipidemia   . Aortic dissection, thoracic   . BPH (benign prostatic hyperplasia)   . Hemorrhoids   . Aortic dissection 11/05/1998    S/P emergency repair of acute type A aortic dissection with resuspension of native aortic valve  . 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 CHF (congestive heart failure) 08-30-47    2D Echo - EF 50-55%, mild-moderately dilated right ventricle, mild-moderate tricuspid valve regurgitation, moderately dilated right atrium  . Bilateral lower extremity edema   . Atrial fibrillation 01/30/2013    Atrial fibrillation   . Pleural effusion, right, large 01/30/2013  . Severe tricuspid valve regurgitation 01/30/2013    Severe tricuspid regurg by recent 2-D echo with a dilated tricuspid annulus, moderate pulmonary hypertension and biatrial enlargement   . Mitral regurgitation 02/20/2013  . Exertional shortness of breath     "for awhile now"  (09/11/2013)  . History of blood transfusion     Past Surgical History  Procedure Laterality Date  . Hemorrhoid surgery  2009    Internal, external hemorrhoidectomy, general anesthesia,prone position. [Other]  . Open reduction and internal fixation of right distal radius fracture using hand innovations distal radius volar locking plate.  07/2005    Dr Ninfa Linden  . Tee without cardioversion N/A 02/17/2013    Procedure: TRANSESOPHAGEAL ECHOCARDIOGRAM (TEE);  Surgeon: [name], MD;  Location: [facility] ENDOSCOPY;  Service: Cardiovascular;  Laterality: N/A;  . Repair of acute type a aortic dissection with resuspension of native aortic valve  10/15/1998    Dr Roxy Manns    There were no vitals taken for this visit.  Visit Diagnosis:  Abnormality of gait  Weakness generalized      Subjective Assessment - 10/23/14 1410    Symptoms Denies falls or pain.   Currently in Pain? No/denies                    [facility] Adult PT Treatment/Exercise - 10/24/14 0809    Transfers   Transfers Sit to Stand;Stand to Sit   Sit to Stand 5: Supervision;With upper extremity assist;From chair/3-in-1   Sit to Stand Details (indicate cue type and reason) tends to lean to left with decreased weight bearing RLE   Stand to Sit 5: Supervision;With upper extremity assist;To chair/3-in-1   Ambulation/Gait   Ambulation/Gait Yes   Ambulation/Gait Assistance 4: Min guard;5: Supervision   Ambulation Distance (  Feet) 330 Feet  220 x 2   Assistive device Straight cane;Rollator   Gait Pattern Decreased hip/knee flexion - right;Decreased dorsiflexion - right;Right circumduction;Lateral trunk lean to left;Poor foot clearance - right   Ambulation Surface Level;Indoor   High Level Balance   High Level Balance Activities Other (comment)  attempted taps to 4" step-Unable to correctly place RLE   High Level Balance Comments used cane in LUE to balance   Knee/Hip Exercises: Seated   Heel Slides Right;10 reps;Other  (comment)  used pillow case on tile floor to assist with flexion   Knee/Hip Exercises: Supine   Short Arc Quad Sets Right;1 set;15 reps   Heel Slides AAROM;Right;15 reps   Heel Slides Limitations assist needed to "unlock" knee to allow for flexion-? tone   Bridges Both;10 reps  then RLE only x 10   Other Supine Knee Exercises RLE marching x 5 x 2                PT Education - 10/24/14 0822    Education provided Yes   Education Details Seated hamstring curl with pillowcase   Person(s) Educated Patient   Methods Explanation;Demonstration   Comprehension Verbalized understanding          PT Short Term Goals - 10/11/14 1519    PT SHORT TERM GOAL #1   Title Pt independent in HEP to improve functional mobility. Target date: 10/09/14.   Status Partially Met   PT SHORT TERM GOAL #2   Title Perform BERG and write appropriate STG and LTG. Target date: 10/09/14.   Status Achieved   PT SHORT TERM GOAL #3   Title Pt will perform TUG, with LRAD, in <23 second to decrease falls risk. Target date: 10/09/14.   Baseline 23.69 seconds with rollator on 10/09/14.   Status Partially Met   PT SHORT TERM GOAL #4   Title Pt will ambulate 300' with LRAD over even/uneven terrain without LOB to improve functional mobility. Target date: 10/09/14.   Status Achieved   PT SHORT TERM GOAL #5   Title Pt will report he ambulated at the park one time/week, to improve quality of life. Target date: 10/09/14.   Baseline due to cold/icy weather.   Status Not Met   PT SHORT TERM GOAL #6   Title Pt will improve BERG balance score to >/=33/56 to decrease falls risk. Target date: 10/09/14.   Baseline 33/56   Status Achieved           PT Long Term Goals - 10/04/14 1603    PT LONG TERM GOAL #1   Title Pt will verbalize undertsanding and agreement of falls prevention strategies. Target date: 11/06/14.   Status On-going   PT LONG TERM GOAL #2   Title Pt will improve gait speed with LRAD to >/=1.63f/sec. to  improve functional mobility. Target date: 11/06/14.   Status On-going   PT LONG TERM GOAL #3   Title Pt will ambulate 500' over even/uneven terrain with LRAD to improve functional mobility. Target date: 11/06/14.   Status On-going   PT LONG TERM GOAL #4   Title Pt will perform TUG in <19 seconds, with LRAD, to decrease falls risk. Target date: 11/06/14.   Status On-going   PT LONG TERM GOAL #5   Title Pt will improve BERG balance score to >/=37/56 to decrease falls risk. Target date: 11/06/14.   Status On-going               Plan - 10/24/14  0823    Clinical Impression Statement Pt continues to make steady progress.  Working on gait with cane in PT.  Continue PT per POC.   Pt will benefit from skilled therapeutic intervention in order to improve on the following deficits Abnormal gait;Decreased endurance;Decreased knowledge of use of DME;Decreased balance;Decreased strength;Decreased mobility   Rehab Potential Good   PT Frequency 2x / week   PT Duration 8 weeks   PT Treatment/Interventions ADLs/Self Care Home Management;Gait training;Neuromuscular re-education;Stair training;Functional mobility training;Patient/family education;Therapeutic activities;Therapeutic exercise;Electrical Stimulation;Manual techniques;Balance training;DME Instruction   PT Next Visit Plan Progress ambulation with SPC, progress balance training. Provide handout of "chair knee flexion".   Consulted and Agree with Plan of Care Patient          G-Codes - November 10, 2014 4628    Functional Assessment Tool Used BERG 33/56;TUG 23.69 seconds with Rollator   Functional Limitation Mobility: Walking and moving around   Mobility: Walking and Moving Around Current Status [phone]) At least 20 percent but less than 40 percent impaired, limited or restricted   Mobility: Walking and Moving Around Goal Status [phone]) At least 1 percent but less than 20 percent impaired, limited or restricted      Problem List Patient Active  Problem List   Diagnosis Date Noted  . Tricuspid regurgitation 10/08/2014  . RVF (right ventricular failure) 10/08/2014  . Chronic diastolic congestive heart failure 10/09/2013  . Sinus pause 09/16/2013  . Anemia due to blood loss, acute in setting of supratheraputic INR 09/11/13 09/12/2013  . 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 Acute renal insufficiency 09/12/2013  . Anasarca 09/12/2013  . Anemia 09/12/2013  . Bilateral leg edema 09/11/2013  . Acute right-sided congestive heart failure 09/11/2013  . Unstable gait 09/11/2013  . Long term (current) use of anticoagulants 04/11/2013  . Dissection of aorta, thoracic- surgery Feb 2000 02/20/2013  . Mitral regurgitation 02/20/2013  . Lower extremity edema 02/20/2013  . Persistent atrial fibrillation 01/30/2013  . Volume overload 01/30/2013  . Severe tricuspid valve regurgitation 01/30/2013  . Pleural effusion, right, large 01/30/2013  . MS (multiple sclerosis) 01/13/2013  . Thrombocytopenia 07/26/2011  . Aortic dissection- chronic abd dissection  11/05/1998     [facility] [address] [place], [place], [address] Phone: [phone]   Fax:  [place], [place] 2014-11-10 8:30 am Phone: [phone] Fax: [phone]    G-Code completed by primary PT.  Geoffry Paradise, PT,DPT 2014/11/10 12:09 PM Phone: [phone] Fax: [phone]

## 2014-10-25 ENCOUNTER — Ambulatory Visit: Payer: Medicare Other

## 2014-10-25 DIAGNOSIS — R269 Unspecified abnormalities of gait and mobility: Secondary | ICD-10-CM

## 2014-10-25 DIAGNOSIS — R531 Weakness: Secondary | ICD-10-CM

## 2014-10-25 NOTE — Patient Instructions (Signed)
Chair Knee Flexion   Keeping feet on floor with pillowcase or towel under foot, slide R foot back, bending knee. Hold _2__ seconds. Repeat _10__ times. Do __2__ sessions a day.  http://gt2.exer.us/304   Copyright  VHI. All rights reserved.

## 2014-10-25 NOTE — Therapy (Signed)
[facility] [address] [place], [place], [address] Phone: [phone]   Fax:  [phone]  Physical Therapy Treatment  Patient Details  Name: [name] MRN: [id number] Date of Birth: 02-01-1946 Referring Provider:  Criselda Peaches, MD  Encounter Date: 10/25/2014      PT End of Session - 10/25/14 1718    Visit Number 11   Number of Visits 17   Date for PT Re-Evaluation 11/09/14   Authorization Type G-code every 10th visit.   PT Start Time 1447   PT Stop Time 1528   PT Time Calculation (min) 41 min   Equipment Utilized During Treatment Gait belt   Activity Tolerance Patient tolerated treatment well   Behavior During Therapy WFL for tasks assessed/performed      Past Medical History  Diagnosis Date  . Neuromuscular disorder   . Hypertension   . Anemia   . Depression   . Multiple sclerosis   . Thrombocytopenia   . Dyslipidemia   . Aortic dissection, thoracic   . BPH (benign prostatic hyperplasia)   . Hemorrhoids   . Aortic dissection 11/05/1998    S/P emergency repair of acute type A aortic dissection with resuspension of native aortic valve  . 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 CHF (congestive heart failure) 04/17/47    2D Echo - EF 50-55%, mild-moderately dilated right ventricle, mild-moderate tricuspid valve regurgitation, moderately dilated right atrium  . Bilateral lower extremity edema   . Atrial fibrillation 01/30/2013    Atrial fibrillation   . Pleural effusion, right, large 01/30/2013  . Severe tricuspid valve regurgitation 01/30/2013    Severe tricuspid regurg by recent 2-D echo with a dilated tricuspid annulus, moderate pulmonary hypertension and biatrial enlargement   . Mitral regurgitation 02/20/2013  . Exertional shortness of breath     "for awhile now"  (09/11/2013)  . History of blood transfusion     Past Surgical History  Procedure Laterality Date  . Hemorrhoid surgery  2009    Internal, external hemorrhoidectomy, general anesthesia,prone position. [Other]  . Open reduction and internal fixation of right distal radius fracture using hand innovations distal radius volar locking plate.  07/2005    Dr Ninfa Linden  . Tee without cardioversion N/A 02/17/2013    Procedure: TRANSESOPHAGEAL ECHOCARDIOGRAM (TEE);  Surgeon: [name], MD;  Location: [facility] ENDOSCOPY;  Service: Cardiovascular;  Laterality: N/A;  . Repair of acute type a aortic dissection with resuspension of native aortic valve  10/15/1998    Dr Roxy Manns    There were no vitals taken for this visit.  Visit Diagnosis:  Abnormality of gait  Weakness generalized      Subjective Assessment - 10/25/14 1452    Symptoms Pt denied falls or changes since last visit.   Patient Stated Goals walk better, go for longer walks in the park without walker, walk faster   Currently in Pain? No/denies                    [facility] Adult PT Treatment/Exercise - 10/25/14 1517    Ambulation/Gait   Ambulation/Gait Yes   Ambulation/Gait Assistance 4: Min guard;5: Supervision;4: Min assist   Ambulation/Gait Assistance Details Pt ambulated over even terrain while performing head turns and 180 degree turns. VC's to improve stride length and upright posture. PT educated pt on sequencing with SPC. Pt  required min A during one 180 degree turn due to LOB, as pt has difficulty shifting weight onto R LE due to R LE weakness.   Ambulation Distance (Feet) --  69' with SPC, 36' and 39' with rollator   Assistive device Straight cane   Gait Pattern Decreased hip/knee flexion - right;Decreased dorsiflexion - right;Right circumduction;Lateral trunk lean to left;Poor foot clearance - right   Ambulation Surface Level;Indoor   Balance   Balance Assessed Yes   Dynamic Standing Balance   Dynamic Standing -  Balance Support No upper extremity supported;Left upper extremity supported   Dynamic Standing - Level of Assistance 4: Min assist   Dynamic Standing - Balance Activities Other (comment)   Dynamic Standing - Comments B LE: 2" toe taps x10/LE, with intermittent UE support on SPC. Pt required min guard to min A to maintain balance. VC's to improve lateral weight shifting and tactile cues to improve R knee flexion while tapping R toes on step. Pt required increased time and seated rest breaks due to fatigue.   Exercises   Exercises Knee/Hip                PT Education - 10/25/14 1718    Education provided Yes   Education Details Seated hamstring HEP   Person(s) Educated Patient   Methods Explanation   Comprehension Verbalized understanding          PT Short Term Goals - 10/11/14 1519    PT SHORT TERM GOAL #1   Title Pt independent in HEP to improve functional mobility. Target date: 10/09/14.   Status Partially Met   PT SHORT TERM GOAL #2   Title Perform BERG and write appropriate STG and LTG. Target date: 10/09/14.   Status Achieved   PT SHORT TERM GOAL #3   Title Pt will perform TUG, with LRAD, in <23 second to decrease falls risk. Target date: 10/09/14.   Baseline 23.69 seconds with rollator on 10/09/14.   Status Partially Met   PT SHORT TERM GOAL #4   Title Pt will ambulate 300' with LRAD over even/uneven terrain without LOB to improve functional mobility. Target date: 10/09/14.   Status Achieved   PT SHORT TERM GOAL #5   Title Pt will report he ambulated at the park one time/week, to improve quality of life. Target date: 10/09/14.   Baseline due to cold/icy weather.   Status Not Met   PT SHORT TERM GOAL #6   Title Pt will improve BERG balance score to >/=33/56 to decrease falls risk. Target date: 10/09/14.   Baseline 33/56   Status Achieved           PT Long Term Goals - 10/04/14 1603    PT LONG TERM GOAL #1   Title Pt will verbalize undertsanding and agreement of  falls prevention strategies. Target date: 11/06/14.   Status On-going   PT LONG TERM GOAL #2   Title Pt will improve gait speed with LRAD to >/=1.81f/sec. to improve functional mobility. Target date: 11/06/14.   Status On-going   PT LONG TERM GOAL #3   Title Pt will ambulate 500' over even/uneven terrain with LRAD to improve functional mobility. Target date: 11/06/14.   Status On-going   PT LONG TERM GOAL #4   Title Pt will perform TUG in <19 seconds, with LRAD, to decrease falls risk. Target date: 11/06/14.   Status On-going   PT LONG TERM GOAL #5   Title Pt will improve BERG balance score to >/=37/56  to decrease falls risk. Target date: 11/06/14.   Status On-going               Plan - 10/25/14 1719    Clinical Impression Statement Pt demonstrated progress, as he required less cues to improve upright posture during ambulation. Pt continues to be limited by fatigue, as he required seated rest breaks. Pt would continue to benefit from skilled PT to improve safety during functional mobility.   Pt will benefit from skilled therapeutic intervention in order to improve on the following deficits Abnormal gait;Decreased endurance;Decreased knowledge of use of DME;Decreased balance;Decreased strength;Decreased mobility   Rehab Potential Good   PT Frequency 2x / week   PT Duration 8 weeks   PT Treatment/Interventions ADLs/Self Care Home Management;Gait training;Neuromuscular re-education;Stair training;Functional mobility training;Patient/family education;Therapeutic activities;Therapeutic exercise;Electrical Stimulation;Manual techniques;Balance training;DME Instruction   PT Next Visit Plan Progress dynamic ambulation with SPC, weight shifting balance activities.   Consulted and Agree with Plan of Care Patient        Problem List Patient Active Problem List   Diagnosis Date Noted  . Tricuspid regurgitation 10/08/2014  . RVF (right ventricular failure) 10/08/2014  . Chronic diastolic  congestive heart failure 10/09/2013  . Sinus pause 09/16/2013  . Anemia due to blood loss, acute in setting of supratheraputic INR 09/11/13 09/12/2013  . 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 Acute renal insufficiency 09/12/2013  . Anasarca 09/12/2013  . Anemia 09/12/2013  . Bilateral leg edema 09/11/2013  . Acute right-sided congestive heart failure 09/11/2013  . Unstable gait 09/11/2013  . Long term (current) use of anticoagulants 04/11/2013  . Dissection of aorta, thoracic- surgery Feb 2000 02/20/2013  . Mitral regurgitation 02/20/2013  . Lower extremity edema 02/20/2013  . Persistent atrial fibrillation 01/30/2013  . Volume overload 01/30/2013  . Severe tricuspid valve regurgitation 01/30/2013  . Pleural effusion, right, large 01/30/2013  . MS (multiple sclerosis) 01/13/2013  . Thrombocytopenia 07/26/2011  . Aortic dissection- chronic abd dissection  11/05/1998    Loneta Tamplin L 10/25/2014, 5:23 PM  [employer] [facility] [address] [place], [place], [address] Phone: [phone]   Fax:  [phone]     Geoffry Paradise, PT,DPT 10/25/2014 5:23 PM Phone: [phone] Fax: [phone]

## 2014-10-29 ENCOUNTER — Ambulatory Visit: Payer: Medicare Other

## 2014-11-01 ENCOUNTER — Ambulatory Visit: Payer: Medicare Other

## 2014-11-01 DIAGNOSIS — R269 Unspecified abnormalities of gait and mobility: Secondary | ICD-10-CM | POA: Diagnosis not present

## 2014-11-01 DIAGNOSIS — R531 Weakness: Secondary | ICD-10-CM

## 2014-11-01 NOTE — Patient Instructions (Signed)
Piriformis Stretch - Supine   Pull uninvolved knee across body toward opposite shoulder. Hold slight stretch for _30__ seconds. Repeat with involved leg. Repeat _3__ times. Do __2-3_ times per day.  Copyright  VHI. All rights reserved.

## 2014-11-02 NOTE — Therapy (Signed)
[facility] [address] [place], [place], [address] Phone: [phone]   Fax:  [phone]  Physical Therapy Treatment  Patient Details  Name: [name] MRN: [id number] Date of Birth: Mar 23, 1946 Referring Provider:  Criselda Peaches, MD  Encounter Date: 11/01/2014      PT End of Session - 11/02/14 1112    Visit Number 12   Number of Visits 17   Date for PT Re-Evaluation 11/09/14   Authorization Type G-code every 10th visit.   PT Start Time 1445   PT Stop Time 1529   PT Time Calculation (min) 44 min   Equipment Utilized During Treatment Gait belt   Activity Tolerance Patient tolerated treatment well   Behavior During Therapy WFL for tasks assessed/performed      Past Medical History  Diagnosis Date  . Neuromuscular disorder   . Hypertension   . Anemia   . Depression   . Multiple sclerosis   . Thrombocytopenia   . Dyslipidemia   . Aortic dissection, thoracic   . BPH (benign prostatic hyperplasia)   . Hemorrhoids   . Aortic dissection 11/05/1998    S/P emergency repair of acute type A aortic dissection with resuspension of native aortic valve  . 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 CHF (congestive heart failure) 07/31/47    2D Echo - EF 50-55%, mild-moderately dilated right ventricle, mild-moderate tricuspid valve regurgitation, moderately dilated right atrium  . Bilateral lower extremity edema   . Atrial fibrillation 01/30/2013    Atrial fibrillation   . Pleural effusion, right, large 01/30/2013  . Severe tricuspid valve regurgitation 01/30/2013    Severe tricuspid regurg by recent 2-D echo with a dilated tricuspid annulus, moderate pulmonary hypertension and biatrial enlargement   . Mitral regurgitation 02/20/2013  . Exertional shortness of breath     "for awhile now"  (09/11/2013)  . History of blood transfusion     Past Surgical History  Procedure Laterality Date  . Hemorrhoid surgery  2009    Internal, external hemorrhoidectomy, general anesthesia,prone position. [Other]  . Open reduction and internal fixation of right distal radius fracture using hand innovations distal radius volar locking plate.  07/2005    Dr Ninfa Linden  . Tee without cardioversion N/A 02/17/2013    Procedure: TRANSESOPHAGEAL ECHOCARDIOGRAM (TEE);  Surgeon: [name], MD;  Location: [facility] ENDOSCOPY;  Service: Cardiovascular;  Laterality: N/A;  . Repair of acute type a aortic dissection with resuspension of native aortic valve  10/15/1998    Dr Roxy Manns    There were no vitals taken for this visit.  Visit Diagnosis:  Abnormality of gait  Weakness generalized      Subjective Assessment - 11/01/14 1450    Symptoms Pt reported he has been taking 2 tablets of stool softners, which has been helping. Pt denied falls since last visit. Pt reported he met with Gerald Stabs to fit for AFO yesterday.   Patient Stated Goals walk better, go for longer walks in the park without walker, walk faster   Currently in Pain? No/denies                    [facility] Adult PT Treatment/Exercise - 11/01/14 1455    Bed Mobility   Bed Mobility Sit to Sidelying Right;Right Sidelying to Sit   Right Sidelying to Sit 5: Supervision;HOB flat   Right  Sidelying to Sit Details (indicate cue type and reason) VC's to place weight through entire forearm vs. elbow, as pt has been experiencing R elbow pain. Performed x2.   Sit to Sidelying Right 5: Supervision   Sit to Sidelying Right Details (indicate cue type and reason) VC's for technique. Performed x2.   Transfers   Transfers Sit to Stand;Stand to Sit   Sit to Stand 5: Supervision;Without upper extremity assist;From chair/3-in-1  to/from mat   Sit to Stand Details (indicate cue type and reason) x5. Cues to place equal weight through B LEs vs. more weight on L  LE. Cues to scoot forward and shift weight anteriorly.   Stand to Sit 5: Supervision   Stand to Sit Details x5. Supervision for safety.   Ambulation/Gait   Ambulation/Gait Yes   Ambulation/Gait Assistance 4: Min guard;4: Min assist   Ambulation/Gait Assistance Details Pt ambulated over even terrain with and without head turns, and progressed from min guard to supervision. Pt required min A during 2 LOB episodes, while he was trying to ambulate with less R LE circumduction. VC's to improve R knee flexion, heel strike, stride length and to look straight ahead. Pt required seated and standing rest breaks during ambulation due to fatigue.   Ambulation Distance (Feet) --  49' with SPC, 75's with rollator   Assistive device Straight cane;Rollator   Gait Pattern Decreased hip/knee flexion - right;Decreased dorsiflexion - right;Right circumduction;Lateral trunk lean to left;Poor foot clearance - right;Decreased stride length   Ambulation Surface Level;Indoor   Exercises   Exercises Knee/Hip   Knee/Hip Exercises: Stretches   Piriformis Stretch 30 seconds;5 reps  R LE   Piriformis Stretch Limitations Pt used sheet to bring R knee towards L shoulder and then was able to use B UEs to hold stretch. VC's and demonstration for technique. Stretch performed to improve ER flexibility.                PT Education - 11/02/14 1111    Education provided Yes   Education Details Piriformis stretch. PT also educated pt on proper sidelying to sit technique to reduce intermittent R elbow pain.   Person(s) Educated Patient   Methods Explanation;Demonstration;Verbal cues;Handout   Comprehension Verbalized understanding;Returned demonstration          PT Short Term Goals - 10/11/14 1519    PT SHORT TERM GOAL #1   Title Pt independent in HEP to improve functional mobility. Target date: 10/09/14.   Status Partially Met   PT SHORT TERM GOAL #2   Title Perform BERG and write appropriate STG and LTG.  Target date: 10/09/14.   Status Achieved   PT SHORT TERM GOAL #3   Title Pt will perform TUG, with LRAD, in <23 second to decrease falls risk. Target date: 10/09/14.   Baseline 23.69 seconds with rollator on 10/09/14.   Status Partially Met   PT SHORT TERM GOAL #4   Title Pt will ambulate 300' with LRAD over even/uneven terrain without LOB to improve functional mobility. Target date: 10/09/14.   Status Achieved   PT SHORT TERM GOAL #5   Title Pt will report he ambulated at the park one time/week, to improve quality of life. Target date: 10/09/14.   Baseline due to cold/icy weather.   Status Not Met   PT SHORT TERM GOAL #6   Title Pt will improve BERG balance score to >/=33/56 to decrease falls risk. Target date: 10/09/14.   Baseline 33/56   Status Achieved  PT Long Term Goals - 10/04/14 1603    PT LONG TERM GOAL #1   Title Pt will verbalize undertsanding and agreement of falls prevention strategies. Target date: 11/06/14.   Status On-going   PT LONG TERM GOAL #2   Title Pt will improve gait speed with LRAD to >/=1.2f/sec. to improve functional mobility. Target date: 11/06/14.   Status On-going   PT LONG TERM GOAL #3   Title Pt will ambulate 500' over even/uneven terrain with LRAD to improve functional mobility. Target date: 11/06/14.   Status On-going   PT LONG TERM GOAL #4   Title Pt will perform TUG in <19 seconds, with LRAD, to decrease falls risk. Target date: 11/06/14.   Status On-going   PT LONG TERM GOAL #5   Title Pt will improve BERG balance score to >/=37/56 to decrease falls risk. Target date: 11/06/14.   Status On-going               Plan - 11/02/14 1112    Clinical Impression Statement Pt demonstrated progress as he was able to ambulate while performing dynamic gait activites with min guard to min A. Pt required shorter seated rest breaks today, indicating endurance is improving. Pt would continue to benefit from skilled PT to improve safety during  functional mobility.   Pt will benefit from skilled therapeutic intervention in order to improve on the following deficits Abnormal gait;Decreased endurance;Decreased knowledge of use of DME;Decreased balance;Decreased strength;Decreased mobility   Rehab Potential Good   PT Frequency 2x / week   PT Duration 8 weeks   PT Treatment/Interventions ADLs/Self Care Home Management;Gait training;Neuromuscular re-education;Stair training;Functional mobility training;Patient/family education;Therapeutic activities;Therapeutic exercise;Electrical Stimulation;Manual techniques;Balance training;DME Instruction   PT Next Visit Plan Begin to assess LTGs as POC ends 11/09/14, cancel appointments the week of 11/12/14 or renew if appropriate.   Consulted and Agree with Plan of Care Patient        Problem List Patient Active Problem List   Diagnosis Date Noted  . Tricuspid regurgitation 10/08/2014  . RVF (right ventricular failure) 10/08/2014  . Chronic diastolic congestive heart failure 10/09/2013  . Sinus pause 09/16/2013  . Anemia due to blood loss, acute in setting of supratheraputic INR 09/11/13 09/12/2013  . 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 Acute renal insufficiency 09/12/2013  . Anasarca 09/12/2013  . Anemia 09/12/2013  . Bilateral leg edema 09/11/2013  . Acute right-sided congestive heart failure 09/11/2013  . Unstable gait 09/11/2013  . Long term (current) use of anticoagulants 04/11/2013  . Dissection of aorta, thoracic- surgery Feb 2000 02/20/2013  . Mitral regurgitation 02/20/2013  . Lower extremity edema 02/20/2013  . Persistent atrial fibrillation 01/30/2013  . Volume overload 01/30/2013  . Severe tricuspid valve regurgitation 01/30/2013  . Pleural effusion, right, large 01/30/2013  . MS (multiple sclerosis) 01/13/2013  . Thrombocytopenia 07/26/2011  . Aortic dissection- chronic abd dissection  11/05/1998    Jamaine Quintin L 11/02/2014, 11:16  AM  [facility] [place] [address]Phone: [phone]  Fax:  [phone]    JGeoffry Paradise PT,DPT 11/02/2014 11:17 AM Phone: [phone]: [phone]

## 2014-11-06 ENCOUNTER — Encounter: Payer: Self-pay | Admitting: Physical Therapy

## 2014-11-06 ENCOUNTER — Ambulatory Visit: Payer: Medicare Other | Admitting: Physical Therapy

## 2014-11-06 DIAGNOSIS — R269 Unspecified abnormalities of gait and mobility: Secondary | ICD-10-CM

## 2014-11-06 NOTE — Therapy (Signed)
[facility] [address] [place], [place], [address] Phone: [phone]   Fax:  [phone]  Physical Therapy Treatment  Patient Details  Name: [name] MRN: [id number] Date of Birth: Sep 10, 1946 Referring Provider:  Criselda Peaches, MD  Encounter Date: 11/06/2014      PT End of Session - 11/06/14 1454    Visit Number 13   Number of Visits 17   Date for PT Re-Evaluation 11/09/14   Authorization Type G-code every 10th visit.   PT Start Time 1402   PT Stop Time 1448   PT Time Calculation (min) 46 min   Activity Tolerance Patient tolerated treatment well      Past Medical History  Diagnosis Date  . Neuromuscular disorder   . Hypertension   . Anemia   . Depression   . Multiple sclerosis   . Thrombocytopenia   . Dyslipidemia   . Aortic dissection, thoracic   . BPH (benign prostatic hyperplasia)   . Hemorrhoids   . Aortic dissection 11/05/1998    S/P emergency repair of acute type A aortic dissection with resuspension of native aortic valve  . 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 CHF (congestive heart failure) Jan 15, 1946    2D Echo - EF 50-55%, mild-moderately dilated right ventricle, mild-moderate tricuspid valve regurgitation, moderately dilated right atrium  . Bilateral lower extremity edema   . Atrial fibrillation 01/30/2013    Atrial fibrillation   . Pleural effusion, right, large 01/30/2013  . Severe tricuspid valve regurgitation 01/30/2013    Severe tricuspid regurg by recent 2-D echo with a dilated tricuspid annulus, moderate pulmonary hypertension and biatrial enlargement   . Mitral regurgitation 02/20/2013  . Exertional shortness of breath     "for awhile now" (09/11/2013)  . History of blood transfusion     Past Surgical History  Procedure Laterality Date  .  Hemorrhoid surgery  2009    Internal, external hemorrhoidectomy, general anesthesia,prone position. [Other]  . Open reduction and internal fixation of right distal radius fracture using hand innovations distal radius volar locking plate.  07/2005    Dr Ninfa Linden  . Tee without cardioversion N/A 02/17/2013    Procedure: TRANSESOPHAGEAL ECHOCARDIOGRAM (TEE);  Surgeon: [name], MD;  Location: [facility] ENDOSCOPY;  Service: Cardiovascular;  Laterality: N/A;  . Repair of acute type a aortic dissection with resuspension of native aortic valve  10/15/1998    Dr Roxy Manns    There were no vitals taken for this visit.  Visit Diagnosis:  Abnormality of gait      Subjective Assessment - 11/06/14 1417    Symptoms Pt denies falls or changes.   Currently in Pain? No/denies                    [facility] Adult PT Treatment/Exercise - 11/06/14 1418    Transfers   Transfers Sit to Stand;Stand to Sit   Sit to Stand 6: Modified independent (Device/Increase time)   Stand to Sit 6: Modified independent (Device/Increase time)   Ambulation/Gait   Ambulation/Gait Yes   Ambulation/Gait Assistance 5: Supervision   Ambulation Distance (Feet) 120 Feet  x 3   Assistive device Rollator   Gait Pattern Decreased hip/knee flexion - right;Decreased dorsiflexion - right;Right circumduction;Lateral trunk lean to left;Poor foot clearance - right;Decreased stride length   Ambulation Surface Level;Indoor   Gait velocity 2.54  Berg Balance Test   Sit to Stand Able to stand without using hands and stabilize independently   Standing Unsupported Able to stand safely 2 minutes   Sitting with Back Unsupported but Feet Supported on Floor or Stool Able to sit safely and securely 2 minutes   Stand to Sit Sits safely with minimal use of hands   Transfers Able to transfer safely, minor use of hands   Standing Unsupported with Eyes Closed Able to stand 10 seconds safely   Standing Ubsupported with Feet Together Able to place  feet together independently and stand for 1 minute with supervision   From Standing, Reach Forward with Outstretched Arm Can reach confidently >25 cm (10")   From Standing Position, Pick up Object from Floor Able to pick up shoe, needs supervision   From Standing Position, Turn to Look Behind Over each Shoulder Turn sideways only but maintains balance   Turn 360 Degrees Needs close supervision or verbal cueing   Standing Unsupported, Alternately Place Feet on Step/Stool Needs assistance to keep from falling or unable to try   Standing Unsupported, One Foot in [place] to take small step independently and hold 30 seconds   Standing on One Leg Tries to lift leg/unable to hold 3 seconds but remains standing independently   Total Score 40   Timed Up and Go Test   TUG Normal TUG   Normal TUG (seconds) 17.53  with rollator                PT Education - 11/06/14 1454    Education provided Yes   Education Details Discharge planned for next visit and can obtain new order if needs PT again once brace arrives   Person(s) Educated Patient   Methods Explanation   Comprehension Verbalized understanding          PT Short Term Goals - 10/11/14 1519    PT SHORT TERM GOAL #1   Title Pt independent in HEP to improve functional mobility. Target date: 10/09/14.   Status Partially Met   PT SHORT TERM GOAL #2   Title Perform BERG and write appropriate STG and LTG. Target date: 10/09/14.   Status Achieved   PT SHORT TERM GOAL #3   Title Pt will perform TUG, with LRAD, in <23 second to decrease falls risk. Target date: 10/09/14.   Baseline 23.69 seconds with rollator on 10/09/14.   Status Partially Met   PT SHORT TERM GOAL #4   Title Pt will ambulate 300' with LRAD over even/uneven terrain without LOB to improve functional mobility. Target date: 10/09/14.   Status Achieved   PT SHORT TERM GOAL #5   Title Pt will report he ambulated at the park one time/week, to improve quality of life. Target  date: 10/09/14.   Baseline due to cold/icy weather.   Status Not Met   PT SHORT TERM GOAL #6   Title Pt will improve BERG balance score to >/=33/56 to decrease falls risk. Target date: 10/09/14.   Baseline 33/56   Status Achieved           PT Long Term Goals - 11/06/14 1457    PT LONG TERM GOAL #1   Title Pt will verbalize undertsanding and agreement of falls prevention strategies. Target date: 11/06/14.   Status On-going   PT LONG TERM GOAL #2   Title Pt will improve gait speed with LRAD to >/=1.71f/sec. to improve functional mobility. Target date: 11/06/14.   Status Achieved   PT  LONG TERM GOAL #3   Title Pt will ambulate 500' over even/uneven terrain with LRAD to improve functional mobility. Target date: 11/06/14.   Status On-going   PT LONG TERM GOAL #4   Title Pt will perform TUG in <19 seconds, with LRAD, to decrease falls risk. Target date: 11/06/14.   Status Achieved   PT LONG TERM GOAL #5   Title Pt will improve BERG balance score to >/=37/56 to decrease falls risk. Target date: 11/06/14.   Status Achieved               Plan - 11/06/14 1456    Clinical Impression Statement Pt met LTG # 2,4 and 5.  Finish checking LTG's next visit and d/c per Geoffry Paradise, PT.   Pt will benefit from skilled therapeutic intervention in order to improve on the following deficits Abnormal gait;Decreased endurance;Decreased knowledge of use of DME;Decreased balance;Decreased strength;Decreased mobility   Rehab Potential Good   PT Frequency 2x / week   PT Treatment/Interventions ADLs/Self Care Home Management;Gait training;Neuromuscular re-education;Stair training;Functional mobility training;Patient/family education;Therapeutic activities;Therapeutic exercise;Electrical Stimulation;Manual techniques;Balance training;DME Instruction   PT Next Visit Plan Finish checking LTG's and d/c.   Consulted and Agree with Plan of Care Patient        Problem List Patient Active Problem List    Diagnosis Date Noted  . Tricuspid regurgitation 10/08/2014  . RVF (right ventricular failure) 10/08/2014  . Chronic diastolic congestive heart failure 10/09/2013  . Sinus pause 09/16/2013  . Anemia due to blood loss, acute in setting of supratheraputic INR 09/11/13 09/12/2013  . 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 Acute renal insufficiency 09/12/2013  . Anasarca 09/12/2013  . Anemia 09/12/2013  . Bilateral leg edema 09/11/2013  . Acute right-sided congestive heart failure 09/11/2013  . Unstable gait 09/11/2013  . Long term (current) use of anticoagulants 04/11/2013  . Dissection of aorta, thoracic- surgery Feb 2000 02/20/2013  . Mitral regurgitation 02/20/2013  . Lower extremity edema 02/20/2013  . Persistent atrial fibrillation 01/30/2013  . Volume overload 01/30/2013  . Severe tricuspid valve regurgitation 01/30/2013  . Pleural effusion, right, large 01/30/2013  . MS (multiple sclerosis) 01/13/2013  . Thrombocytopenia 07/26/2011  . Aortic dissection- chronic abd dissection  11/05/1998    Narda Bonds 11/06/2014, 3:03 PM  [facility] [address] [place], [place], [address] Phone: [phone]   Fax:  [place], [employer] 11/06/2014 3:03 PM Phone: [phone] Fax: [phone]

## 2014-11-07 ENCOUNTER — Ambulatory Visit: Payer: Medicare Other | Admitting: Pharmacist Clinician (PhC)/ Clinical Pharmacy Specialist

## 2014-11-08 ENCOUNTER — Ambulatory Visit: Payer: Medicare Other

## 2014-11-09 ENCOUNTER — Ambulatory Visit: Payer: Medicare Other

## 2014-11-09 ENCOUNTER — Ambulatory Visit (INDEPENDENT_AMBULATORY_CARE_PROVIDER_SITE_OTHER): Payer: Medicare Other | Admitting: Pharmacist Clinician (PhC)/ Clinical Pharmacy Specialist

## 2014-11-09 DIAGNOSIS — R269 Unspecified abnormalities of gait and mobility: Secondary | ICD-10-CM | POA: Diagnosis not present

## 2014-11-09 DIAGNOSIS — I4891 Unspecified atrial fibrillation: Secondary | ICD-10-CM

## 2014-11-09 DIAGNOSIS — I4819 Other persistent atrial fibrillation: Secondary | ICD-10-CM

## 2014-11-09 DIAGNOSIS — Z7901 Long term (current) use of anticoagulants: Secondary | ICD-10-CM

## 2014-11-09 DIAGNOSIS — I481 Persistent atrial fibrillation: Secondary | ICD-10-CM

## 2014-11-09 LAB — POCT INR: INR: 2.4

## 2014-11-09 NOTE — Therapy (Signed)
[facility] [address] [place], [place], [address] Phone: [phone]   Fax:  [phone]  Physical Therapy Treatment  Patient Details  Name: [name] MRN: [id number] Date of Birth: [birth year] Referring Provider:  Criselda Peaches, MD  Encounter Date: 11/09/2014      PT End of Session - 11/09/14 1444    Visit Number 14   Number of Visits 17   Date for PT Re-Evaluation 11/09/14   Authorization Type G-code every 10th visit.   PT Start Time 1400   PT Stop Time 1441   PT Time Calculation (min) 41 min   Activity Tolerance Patient tolerated treatment well   Behavior During Therapy WFL for tasks assessed/performed      Past Medical History  Diagnosis Date  . Neuromuscular disorder   . Hypertension   . Anemia   . Depression   . Multiple sclerosis   . Thrombocytopenia   . Dyslipidemia   . Aortic dissection, thoracic   . BPH (benign prostatic hyperplasia)   . Hemorrhoids   . Aortic dissection 11/05/1998    S/P emergency repair of acute type A aortic dissection with resuspension of native aortic valve  . 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 CHF (congestive heart failure) 1946/10/10    2D Echo - EF 50-55%, mild-moderately dilated right ventricle, mild-moderate tricuspid valve regurgitation, moderately dilated right atrium  . Bilateral lower extremity edema   . Atrial fibrillation 01/30/2013    Atrial fibrillation   . Pleural effusion, right, large 01/30/2013  . Severe tricuspid valve regurgitation 01/30/2013    Severe tricuspid regurg by recent 2-D echo with a dilated tricuspid annulus, moderate pulmonary hypertension and biatrial enlargement   . Mitral regurgitation 02/20/2013  . Exertional shortness of breath     "for awhile now" (09/11/2013)  . History of blood transfusion      Past Surgical History  Procedure Laterality Date  . Hemorrhoid surgery  2009    Internal, external hemorrhoidectomy, general anesthesia,prone position. [Other]  . Open reduction and internal fixation of right distal radius fracture using hand innovations distal radius volar locking plate.  07/2005    Dr Ninfa Linden  . Tee without cardioversion N/A 02/17/2013    Procedure: TRANSESOPHAGEAL ECHOCARDIOGRAM (TEE);  Surgeon: [name], MD;  Location: [facility] ENDOSCOPY;  Service: Cardiovascular;  Laterality: N/A;  . Repair of acute type a aortic dissection with resuspension of native aortic valve  10/15/1998    Dr Roxy Manns    There were no vitals taken for this visit.  Visit Diagnosis:  Abnormality of gait      Subjective Assessment - 11/09/14 1403    Symptoms Pt denied falls or changes since last visit. Pt reported decreased elbow pain, since he is now using forearm to perform supine<>sit transfers vs. weight through just elbow.   Patient Stated Goals walk better, go for longer walks in the park without walker, walk faster   Currently in Pain? No/denies                    [facility] Adult PT Treatment/Exercise - 11/09/14 0001    Ambulation/Gait   Ambulation/Gait Yes   Ambulation/Gait Assistance 6: Modified independent (Device/Increase time)   Ambulation/Gait Assistance Details Pt ambulated over even/uneven terrain without LOB. No cues needed to improve stride length, upright posture or to keep R LE  within rollator.   Ambulation Distance (Feet) 575 Feet  with rollator   Assistive device Rollator   Gait Pattern Decreased hip/knee flexion - right;Decreased dorsiflexion - right;Right circumduction;Lateral trunk lean to left;Poor foot clearance - right;Decreased stride length   Ambulation Surface Level;Unlevel;Indoor   Gait velocity 2.4f/sec.                PT Education - [id number]    Education provided Yes   Education Details Falls prevention strategies handout.  Educated pt to continue HEP.   Person(s) Educated Patient   Methods Explanation;Handout   Comprehension Verbalized understanding          PT Short Term Goals - 0March 02, 20161444    PT SHORT TERM GOAL #1   Title Pt independent in HEP to improve functional mobility. Target date: 10/09/14.   Status Achieved   PT SHORT TERM GOAL #2   Title Perform BERG and write appropriate STG and LTG. Target date: 10/09/14.   Status Achieved   PT SHORT TERM GOAL #3   Title Pt will perform TUG, with LRAD, in <23 second to decrease falls risk. Target date: 10/09/14.   Baseline 23.69 seconds with rollator on 10/09/14.   Status Achieved   PT SHORT TERM GOAL #4   Title Pt will ambulate 300' with LRAD over even/uneven terrain without LOB to improve functional mobility. Target date: 10/09/14.   Status Achieved   PT SHORT TERM GOAL #5   Title Pt will report he ambulated at the park one time/week, to improve quality of life. Target date: 10/09/14.   Baseline due to cold/icy weather.   Status Achieved   PT SHORT TERM GOAL #6   Title Pt will improve BERG balance score to >/=33/56 to decrease falls risk. Target date: 10/09/14.   Baseline 33/56   Status Achieved           PT Long Term Goals - 02016/03/021445    PT LONG TERM GOAL #1   Title Pt will verbalize undertsanding and agreement of falls prevention strategies. Target date: 11/06/14.   Status Achieved   PT LONG TERM GOAL #2   Title Pt will improve gait speed with LRAD to >/=1.556fsec. to improve functional mobility. Target date: 11/06/14.   Status Achieved   PT LONG TERM GOAL #3   Title Pt will ambulate 500' over even/uneven terrain with LRAD at MOD I level to improve functional mobility. Target date: 11/06/14.   Status Achieved   PT LONG TERM GOAL #4   Title Pt will perform TUG in <19 seconds, with LRAD, to decrease falls risk. Target date: 11/06/14.   Status Achieved   PT LONG TERM GOAL #5   Title Pt will improve BERG balance score to >/=37/56 to decrease  falls risk. Target date: 11/06/14.   Status Achieved               Plan - [id number]    Clinical Impression Statement Pt discharging from PT, please see PT d/c summary for details.          G-Codes - [id number]    Functional Assessment Tool Used BERG 40/56;TUG 17.53 seconds with Rollator; gait speed: 2.2861fec. with rollator   Functional Limitation Mobility: Walking and moving around   Mobility: Walking and Moving Around Goal Status ([id number] least 1 percent but less than 20 percent impaired, limited or restricted   Mobility: Walking and Moving Around Discharge Status ([phone] least 20 percent but less than 40 percent  impaired, limited or restricted      Problem List Patient Active Problem List   Diagnosis Date Noted  . Tricuspid regurgitation 10/08/2014  . RVF (right ventricular failure) 10/08/2014  . Chronic diastolic congestive heart failure 10/09/2013  . Sinus pause 09/16/2013  . Anemia due to blood loss, acute in setting of supratheraputic INR 09/11/13 09/12/2013  . 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 Acute renal insufficiency 09/12/2013  . Anasarca 09/12/2013  . Anemia 09/12/2013  . Bilateral leg edema 09/11/2013  . Acute right-sided congestive heart failure 09/11/2013  . Unstable gait 09/11/2013  . Long term (current) use of anticoagulants 04/11/2013  . Dissection of aorta, thoracic- surgery Feb 2000 02/20/2013  . Mitral regurgitation 02/20/2013  . Lower extremity edema 02/20/2013  . Persistent atrial fibrillation 01/30/2013  . Volume overload 01/30/2013  . Severe tricuspid valve regurgitation 01/30/2013  . Pleural effusion, right, large 01/30/2013  . MS (multiple sclerosis) 01/13/2013  . Thrombocytopenia 07/26/2011  . Aortic dissection- chronic abd dissection  11/05/1998   PHYSICAL THERAPY DISCHARGE SUMMARY  Visits from Start of Care: 14  Current functional level related to goals / functional outcomes:     PT  Long Term Goals - 11/09/14 1445    PT LONG TERM GOAL #1   Title Pt will verbalize undertsanding and agreement of falls prevention strategies. Target date: 11/06/14.   Status Achieved   PT LONG TERM GOAL #2   Title Pt will improve gait speed with LRAD to >/=1.64f/sec. to improve functional mobility. Target date: 11/06/14.   Status Achieved   PT LONG TERM GOAL #3   Title Pt will ambulate 500' over even/uneven terrain with LRAD at MOD I level to improve functional mobility. Target date: 11/06/14.   Status Achieved   PT LONG TERM GOAL #4   Title Pt will perform TUG in <19 seconds, with LRAD, to decrease falls risk. Target date: 11/06/14.   Status Achieved   PT LONG TERM GOAL #5   Title Pt will improve BERG balance score to >/=37/56 to decrease falls risk. Target date: 11/06/14.   Status Achieved        Remaining deficits: Decreased R knee/hip flexion during ambulation, and increased R LE ER during gait.   Education / Equipment: HEP, falls prevention handout, AFO consult/order  Plan: Patient agrees to discharge.  Patient goals were met. Patient is being discharged due to meeting the stated rehab goals.  ?????        Marieliz Strang L 11/09/2014, 3:49 PM  [place] [place] [address]Phone: [phone]  Fax:  [phone] JGeoffry Paradise PT,DPT 11/09/2014 3:49 PM Phone: [phone]: [phone]

## 2014-11-09 NOTE — Patient Instructions (Signed)

## 2014-11-13 ENCOUNTER — Ambulatory Visit: Payer: Medicare Other | Admitting: Physical Therapy

## 2014-11-15 ENCOUNTER — Ambulatory Visit: Payer: Medicare Other

## 2014-11-16 ENCOUNTER — Ambulatory Visit: Payer: Medicare Other

## 2014-12-11 ENCOUNTER — Encounter: Payer: Self-pay | Admitting: Diagnostic Neuroimaging

## 2014-12-21 ENCOUNTER — Ambulatory Visit (INDEPENDENT_AMBULATORY_CARE_PROVIDER_SITE_OTHER): Payer: Medicare Other | Admitting: Pharmacist Clinician (PhC)/ Clinical Pharmacy Specialist

## 2014-12-21 DIAGNOSIS — Z7901 Long term (current) use of anticoagulants: Secondary | ICD-10-CM

## 2014-12-21 DIAGNOSIS — I4891 Unspecified atrial fibrillation: Secondary | ICD-10-CM | POA: Diagnosis not present

## 2014-12-21 DIAGNOSIS — I481 Persistent atrial fibrillation: Secondary | ICD-10-CM | POA: Diagnosis not present

## 2014-12-21 DIAGNOSIS — I4819 Other persistent atrial fibrillation: Secondary | ICD-10-CM

## 2014-12-21 LAB — POCT INR: INR: 2.2

## 2014-12-28 ENCOUNTER — Telehealth: Payer: Self-pay

## 2014-12-28 NOTE — Telephone Encounter (Signed)
PT called pt back regarding pt's request of returning to PT after AFO fitting. PT explained to pt that he would need a new MD referral for PT. Pt verbalized understanding.

## 2014-12-31 ENCOUNTER — Ambulatory Visit (INDEPENDENT_AMBULATORY_CARE_PROVIDER_SITE_OTHER): Payer: Medicare Other | Admitting: Thoracic Surgery (Cardiothoracic Vascular Surgery)

## 2014-12-31 ENCOUNTER — Telehealth: Payer: Self-pay | Admitting: Diagnostic Neuroimaging

## 2014-12-31 ENCOUNTER — Encounter: Payer: Self-pay | Admitting: Thoracic Surgery (Cardiothoracic Vascular Surgery)

## 2014-12-31 ENCOUNTER — Other Ambulatory Visit: Payer: Self-pay | Admitting: *Deleted

## 2014-12-31 VITALS — BP 164/74 | HR 83 | Resp 16 | Ht 68.0 in | Wt 160.0 lb

## 2014-12-31 DIAGNOSIS — I071 Rheumatic tricuspid insufficiency: Secondary | ICD-10-CM

## 2014-12-31 DIAGNOSIS — I7101 Dissection of thoracic aorta: Secondary | ICD-10-CM

## 2014-12-31 DIAGNOSIS — I7122 Aneurysm of the aortic arch, without rupture: Secondary | ICD-10-CM

## 2014-12-31 DIAGNOSIS — I71011 Dissection of aortic arch: Secondary | ICD-10-CM

## 2014-12-31 DIAGNOSIS — G35 Multiple sclerosis: Secondary | ICD-10-CM

## 2014-12-31 DIAGNOSIS — I712 Thoracic aortic aneurysm, without rupture: Secondary | ICD-10-CM | POA: Insufficient documentation

## 2014-12-31 DIAGNOSIS — I5032 Chronic diastolic (congestive) heart failure: Secondary | ICD-10-CM | POA: Diagnosis not present

## 2014-12-31 DIAGNOSIS — I7100 Dissection of unspecified site of aorta: Secondary | ICD-10-CM

## 2014-12-31 NOTE — Progress Notes (Signed)
[address]       [place], [address]             [phone]     CARDIOTHORACIC SURGERY OFFICE NOTE  Referring Provider is [name], MD PCP is GREEN, [name], MD   HPI:  Patient returns for followup of chronic type A aortic dissection s/p repair in 2000 with chronic diastolic and right-sided congestive heart failure, atrial fibrillatioin and severe tricuspid regurgitation. He has a complex history outlined in previous consultation reports and office notes. He originally underwent hemi-arch repair for an acute type A aortic dissection in 2000.  Over the years he has developed gradual delayed aneurysmal enlargement of the chronically dissected transverse aortic arch.  He last underwent CT angiography in May 2014, at which time the maximum diameter of the transverse aortic arch was measured 5.9 cm.  Risks associated with redo surgery for replacement of the aneurysmal transverse aortic arch and chronic dissection would be prohibitive, and the involvement of the arch vessels precludes stent grafting as an alternative. Similarly, the patient would not be considered a surgical candidate for management of severe tricuspid regurgitation.  He was seen most recently in our office on 01/01/2014.  At that time the patient made a decision not to undergo any further follow-up CT scans to reevaluate his thoracic aorta.  Since then he has been followed carefully by [name] and colleagues at the advanced heart failure clinic, where he was seen most recently on 10/08/2014. He is also followed carefully both by his primary care physician, [name] and his neurologist, [name].  Over the past year the patient has remained quite stable from a cardiac standpoint. He has not been hospitalized for acute exacerbations of chronic right-sided heart failure, and overall he has done remarkably well on medical therapy. Unfortunately, the patient's MS continues to progress.  His  physical mobility has slowly declined. He walks using a cane and occasionally a walker. He still swims regularly, although he recently injured his arm swelling and has had to cut back dramatically over the last few weeks. He denies any problems with exertional shortness of breath.  His weight has been stable and he has not been experiencing lower extremity edema or abdominal bloating.  He has had increasing problems with getting himself to the bathroom to urinate when he takes Lasix, and he has developed a regimen where he simply lays in bed for 3 hours and uses a urinal every time he takes Lasix, which is currently prescribed twice daily. He denies any chest pain or back pain which might be related to his chronically dissected thoracic aorta and aneurysm.    Current Outpatient Prescriptions  Medication Sig Dispense Refill  . AMPYRA 10 MG TB12 TAKE 1 TABLET TWICE A DAY 180 tablet 1  . ferrous sulfate 325 (65 FE) MG tablet Take 1 tablet (325 mg total) by mouth 3 (three) times daily with meals. 90 tablet 1  . folic acid (FOLVITE) 1 MG tablet Take 1 mg by mouth daily.      . furosemide (LASIX) 40 MG tablet Take 1 tablet (40 mg total) by mouth 2 (two) times daily. 180 tablet 3  . KLOR-CON M10 10 MEQ tablet Take 2 tablets by mouth daily.     . NON FORMULARY Take 2 tablets by mouth daily. OTC Laxative    . QUEtiapine (SEROQUEL) 25 MG tablet Take 25 mg by mouth at bedtime as needed.     Marland Kitchen  rosuvastatin (CRESTOR) 5 MG tablet Take 5 mg by mouth daily.      Marland Kitchen senna-docusate (SENOKOT-S) 8.6-50 MG per tablet Take 2 tablets by mouth daily.    . TECFIDERA 240 MG CPDR TAKE 1 CAPSULE BY MOUTH TWICE A DAY 180 capsule 1  . warfarin (COUMADIN) 2 MG tablet Take 1 to 1.5 tablets by mouth daily as directed by coumadin clinic 120 tablet 1   No current facility-administered medications for this visit.      Physical Exam:   BP 164/74 mmHg  Pulse 83  Resp 16  Ht 5\' 8"  (1.727 m)  Wt 160 lb (72.576 kg)  BMI 24.33  kg/m2  SpO2 99%  General:  Severely debilitated and weak  Chest:   Clear to auscultation  CV:   Irregular rate and rhythm. No murmur is appreciated  Incisions:  n/a  Abdomen:  Soft, nondistended, nontender  Extremities:  Warm and well perfused, no lower extremity edema  Diagnostic Tests:  n/a   Impression:  The patient remains stable from a cardiac standpoint on chronic diuretic therapy without any episodes of acute exacerbations of chronic diastolic congestive and right sided heart failure.  His blood pressure is a bit elevated in the office today and he is not on a beta blocker.  Per the patient's previous request, no follow up imaging has been scheduled to reevaluate his dilated thoracic aorta with chronic dissection.  He has now changed his mind about this.  The patient's multiple sclerosis continues to slowly progress, and his mobility and quality of life appear to be declining fairly rapidly as a result.  He would not be considered a candidate for surgical treatment of his aortic aneurysm with chronic aortic dissection under any circumstances.  Similarly, the patient is not a candidate for surgical management of severe tricuspid regurgitation.   Plan:  The patient has now requested to undergo follow-up CT angiography of the aorta for information and prognostic purposes.  Given that his renal function has remained stable, this seems reasonable to give him an idea whether or not his aorta has continued to enlarge.  We have not made recommendations for any changes to the patient's current medical therapy at this time.  Whether or not he might tolerate addition of a beta blocker or other medical therapy for hypertension will be left to the discretion of [name], [name], and [name].  We will plan to telephone the patient with results of his CT angiogram. All of his questions have been addressed.  I spent in excess of 30 minutes during the conduct of this office consultation  and >50% of this time involved direct face-to-face encounter with the patient for counseling and/or coordination of their care.   Valentina Gu. Roxy Manns, MD 12/31/2014 4:46 PM

## 2014-12-31 NOTE — Telephone Encounter (Signed)
Pt called office wanting to request Dr [facility] referral for physical therapy in order to use his new foot brace.  Pt states he has tried to reach out to [facility] on numerous occassions through [employer] and has not received a response. Pt would like to be contacted when request is submitted.

## 2015-01-01 LAB — CREATININE, SERUM: Creat: 1.4 mg/dL — ABNORMAL HIGH (ref 0.50–1.35)

## 2015-01-01 LAB — BUN: BUN: 25 mg/dL — ABNORMAL HIGH (ref 6–23)

## 2015-01-01 NOTE — Telephone Encounter (Signed)
Ordered. pls let pt know. -VRP

## 2015-01-03 ENCOUNTER — Telehealth: Payer: Self-pay | Admitting: Thoracic Surgery (Cardiothoracic Vascular Surgery)

## 2015-01-03 ENCOUNTER — Encounter: Payer: Self-pay | Admitting: Thoracic Surgery (Cardiothoracic Vascular Surgery)

## 2015-01-03 ENCOUNTER — Ambulatory Visit
Admission: RE | Admit: 2015-01-03 | Discharge: 2015-01-03 | Disposition: A | Discharge status: Home / Self Care | Payer: Medicare Other | Source: Ambulatory Visit | Attending: Thoracic Surgery (Cardiothoracic Vascular Surgery) | Admitting: Thoracic Surgery (Cardiothoracic Vascular Surgery)

## 2015-01-03 DIAGNOSIS — I7100 Dissection of unspecified site of aorta: Secondary | ICD-10-CM

## 2015-01-03 MED ORDER — IOPAMIDOL (ISOVUE-370) INJECTION 76%
75.0000 mL | Freq: Once | INTRAVENOUS | Status: AC | PRN
  Administered 2015-01-03: 75 mL via INTRAVENOUS

## 2015-01-03 NOTE — Telephone Encounter (Signed)
Called patient and discussed the results of his follow up CT angiogram of the aorta.  Overall remarkably little change over the past 2 years.  Perhaps slight enlargement of the aortic root, but the change is not significant and could simply represent difference of technique in a non-gated scan.  The transverse aortic arch has not gotten any larger in size.  Although both areas remain significantly dilated, they remain stable.  All questions answered.  Rexene Alberts 01/03/2015 7:10 PM

## 2015-01-04 ENCOUNTER — Telehealth: Payer: Self-pay | Admitting: Diagnostic Neuroimaging

## 2015-01-04 ENCOUNTER — Encounter: Payer: Self-pay | Admitting: Diagnostic Neuroimaging

## 2015-01-04 MED ORDER — DALFAMPRIDINE ER 10 MG PO TB12: 10.0000 mg | ORAL_TABLET | Freq: Two times a day (BID) | ORAL | Status: DC

## 2015-01-04 NOTE — Telephone Encounter (Signed)
The last time we sent refills for this prescription,it was for 90 days per fill.  I called back to advise.  Patient was very upset.  Each time I tried to speak he would not allow me to.  When he asked a question and I tried to answer he would cut me off and say are you going to let me finish talking or what.  He is upset that the pharmacy did not do his order correctly and they are supposed to ship his meds for delivery tomorrow.  He felt as though no one listens to him and spoke for several minutes.  I waited until he was finished speaking, then explained I understand his frustration, and we would try to help in any way we can.  He told me I needed to call [phone] and tell them he needs an appeal due to dispensing limit guidelines, as his plan will not allow him to get more than 30 days per fill.  Advised I will contact the plan.  I called the number provided by the patient.  Spoke with Adrianne.  She checked the patients policy and said the prior auth for med is on file, showing an approval valid until 10/07/2015.  She tried to run a test claim for 90 days, and it rejected saying 30 days is the max allowed by this patient's plan.  I advised the patient says he was told we needed to call them for an appeal due to dispensing limit guidelines.  She reviewed the patient's current policy details and said under normal circumstances, this would be an option, however, with this patient's particular plan no appeal process is available, it is not an option.  She said nothing further could be done per plan policy.  I called the patient back to advise.  Although he was unhappy, he verbalized understanding.

## 2015-01-04 NOTE — Telephone Encounter (Signed)
Patient called stating that he received his refill box for AMPYRA 10 MG TB12 but there was no AMPYRA in the box. He called [facility] to see why he did not receive his fill, the pharmacist from [facility] informed him that he should be receiving the script 01/05/15 but only for one month but usually he has been receiving it for 3 months. Please call and advice. # [phone]

## 2015-01-25 ENCOUNTER — Ambulatory Visit: Payer: Medicare Other | Attending: Diagnostic Neuroimaging

## 2015-01-25 DIAGNOSIS — Z9181 History of falling: Secondary | ICD-10-CM | POA: Insufficient documentation

## 2015-01-25 DIAGNOSIS — R269 Unspecified abnormalities of gait and mobility: Secondary | ICD-10-CM | POA: Insufficient documentation

## 2015-01-25 DIAGNOSIS — R531 Weakness: Secondary | ICD-10-CM | POA: Insufficient documentation

## 2015-01-27 ENCOUNTER — Other Ambulatory Visit (HOSPITAL_COMMUNITY): Payer: Self-pay | Admitting: Internal Medicine

## 2015-02-01 ENCOUNTER — Ambulatory Visit (INDEPENDENT_AMBULATORY_CARE_PROVIDER_SITE_OTHER): Payer: Medicare Other | Admitting: Pharmacist Clinician (PhC)/ Clinical Pharmacy Specialist

## 2015-02-01 DIAGNOSIS — I481 Persistent atrial fibrillation: Secondary | ICD-10-CM

## 2015-02-01 DIAGNOSIS — I4891 Unspecified atrial fibrillation: Secondary | ICD-10-CM | POA: Diagnosis not present

## 2015-02-01 DIAGNOSIS — Z7901 Long term (current) use of anticoagulants: Secondary | ICD-10-CM | POA: Diagnosis not present

## 2015-02-01 DIAGNOSIS — I4819 Other persistent atrial fibrillation: Secondary | ICD-10-CM

## 2015-02-01 LAB — POCT INR: INR: 2.4

## 2015-02-05 ENCOUNTER — Ambulatory Visit (INDEPENDENT_AMBULATORY_CARE_PROVIDER_SITE_OTHER): Payer: Medicare Other | Admitting: Diagnostic Neuroimaging

## 2015-02-05 ENCOUNTER — Encounter: Payer: Self-pay | Admitting: Diagnostic Neuroimaging

## 2015-02-05 VITALS — BP 155/83 | HR 89 | Ht 68.0 in | Wt 165.4 lb

## 2015-02-05 DIAGNOSIS — G35 Multiple sclerosis: Secondary | ICD-10-CM | POA: Diagnosis not present

## 2015-02-05 DIAGNOSIS — E559 Vitamin D deficiency, unspecified: Secondary | ICD-10-CM

## 2015-02-05 NOTE — Progress Notes (Signed)
PATIENT: [name] DOB: [birth year]   REASON FOR VISIT: follow up for MS HISTORY FROM: patient and wife  Chief Complaint  Patient presents with  . Follow-up    Multiple Sclerosis      HISTORY OF PRESENT ILLNESS:  UPDATE 02/05/15:   UPDATE 08/02/14: Since last visit, feels stable. Tolerating tecfidera and ampyra. No new events. Uses cane and walker. Swimming more. Wife notes memory issues slightly worse.   UPDATE 01/31/14: Since last visit, continues on tecfidera. No progression of neuro sxs or MS sxs. Asking about driving again. Has not driven in last 3-4 months. Ongoing medical mgmt of his cardiac issues, and he is not a surgical candidate.  UPDATE 10/25/13 (LL): Patient comes in for revisit, since last visit he had worsening right-sided HF and was hospitalized.  He was seen by [name] for surgical consultation to consider tricuspid valve repair and possible Maze procedure.  He was referred to the advanced heart failure clinic who has been adjusting his lasix to manage his lower extremity edema and shortness of breath.  He is to be seen by [name] again for follow up in another month, and says [name] felt that surgery was needed.  He comes in today to discuss with [name] how his MS would be affected by having the surgery.  His wife feels like he is having more difficulty walking since last visit, but he has not had any falls.  He is tolerating Tecfidera and Ampyra well.  UPDATE 07/19/13 (VP): Patient continues to have progressive deterioration in cognitive ability, memory, mood lability, gait stability. Patient is on tecfidera now, and tolerating without side effects. Patient feels when he came off of Betaseron he had some accelerated progression of MS that this has slowed down since starting tecfidera. Unclear whether patient's current level of functioning is on the same trajectory as the decline that was noted while patient was on Betaseron, or whether there has  been some acceleration of deterioration. Other factors include some marital discord and comorbid depression. Other contributing factors include his cardiac issues including atrial fibrillation, on Coumadin, as well as severe tricuspid valve regurgitation leading to peripheral edema. Unfortunately patient is not felt to be a good surgical candidate.   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 The patient is accompanied by his wife. Pt. States he is doing worse since his last visit in Feb. He is walking slower and having more difficulty standing from a seated position. 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 [name] had talked to him about changing MS medications and he thinks he wants to. Having more memory problems. Continuing on betaseron right now.   PRIOR HPI (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 with a history of multiple sclerosis diagnosed in September 1996. He has been on Betaseron since 1996 and has right leg spasticity requiring him to use a cane in his left hand.He does not have any significant side effects from the Betaseron. Blood studies 02/10/2012 are normal CBC and CMP except for a low platelet count of 63K, Hgb 12.1 and alk Phos of 253. This is being followed by [name] and [name]. He has never had lymphopenia or neutropenia. There is no change in his symptoms. He denies bowel or bladder incontinence, weakness, Lhermitte's sign, or double vision. He  swims 3 times per week for 10 laps which is a little over a quarter of a mile, claims he is winded afterwards. He has not fallen, uses a cane in his left hand. MRI of the brain and cervical spine with and without contrast enhancement 03/26/2010 showed multiple subcortical, periventricular, and brainstem white matter hyperintensities without enhancing lesions  present. There were T1 black holes and atrophy present. The cervical MRI showed a large central disc protrusion at C5-6 and spinal cord hyperintensities at C4 and brain stem representing remote demyelinating plaques without enhancing lesions present. Ampyra was started November 2012 with improvement in gait. He was pleased with the addition of the drug and his response. Pt denies injection site problems however he says he is tired of shots. He has been on [employer] since 1996 and we have talked about whether changing to an oral agent should be considered. He had several falls with his right leg giving way on standing. He has right knee pain. He states his memory is worse. He denies numbness delusions or depression. 07/11/2012= (MMSE29/30. Clock drawing task4/4. Animal fluency test 17). His wife has noted more problems walking which the patient is hesitant to discuss. He does admit since calling me for a work in appointment , that he believes he is having more difficulty walking. He uses a walker at home and is noticeably slower. He uses the walker to get out of a bed that is low and out of a chair. He notices shortness of breath on swimming. His swallowing is okay except for occasional difficulty with liquids. He has right knee pain when walking. Betaseron seems to help his knee pain, walking, and his eyes after an injection. He is hesitant to go off of the medication for 3 weeks. He has increased spasms in his right foot and leg that are also moving to the left leg. He has swelling in his legs treated with furosemide.   REVIEW OF SYSTEMS: Full 14 system review of systems performed and notable only for leg swelling murmur restless legs black stools and memory loss.  ALLERGIES: No Known Allergies  HOME MEDICATIONS: Outpatient Prescriptions Prior to Visit  Medication Sig Dispense Refill  . dalfampridine (AMPYRA) 10 MG TB12 Take 1 tablet (10 mg total) by mouth 2 (two) times daily. 180 tablet 1  . ferrous  sulfate 325 (65 FE) MG tablet Take 1 tablet (325 mg total) by mouth 3 (three) times daily with meals. 90 tablet 1  . folic acid (FOLVITE) 1 MG tablet Take 1 mg by mouth daily.      . furosemide (LASIX) 40 MG tablet Take 1 tablet (40 mg total) by mouth 2 (two) times daily. 180 tablet 3  . NON FORMULARY Take 2 tablets by mouth daily. OTC Laxative    . potassium chloride (K-DUR) 10 MEQ tablet TAKE (2) TABLETS DAILY. 60 tablet 3  . QUEtiapine (SEROQUEL) 25 MG tablet Take 25 mg by mouth at bedtime as needed.     . rosuvastatin (CRESTOR) 5 MG tablet Take 5 mg by mouth daily.      Marland Kitchen senna-docusate (SENOKOT-S) 8.6-50 MG per tablet Take 2 tablets by mouth daily.    . TECFIDERA 240 MG CPDR TAKE 1 CAPSULE BY MOUTH TWICE A DAY 180 capsule 1  . warfarin (COUMADIN) 2 MG tablet Take 1 to 1.5 tablets by mouth daily as directed by coumadin clinic 120 tablet 1   No facility-administered medications prior to visit.    PAST MEDICAL HISTORY: Past Medical  History  Diagnosis Date  . Neuromuscular disorder   . Hypertension   . Anemia   . Depression   . Multiple sclerosis   . Thrombocytopenia   . Dyslipidemia   . Aortic dissection, thoracic   . BPH (benign prostatic hyperplasia)   . Hemorrhoids   . Aortic dissection 11/05/1998    S/P emergency repair of acute type A aortic dissection with resuspension of native aortic valve  . 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 CHF (congestive heart failure) 1947/03/18    2D Echo - EF 50-55%, mild-moderately dilated right ventricle, mild-moderate tricuspid valve regurgitation, moderately dilated right atrium  . Bilateral lower extremity edema   . Atrial fibrillation 01/30/2013    Atrial fibrillation   . Pleural effusion, right, large 01/30/2013  . Severe tricuspid valve regurgitation 01/30/2013    Severe tricuspid regurg by recent 2-D echo with  a dilated tricuspid annulus, moderate pulmonary hypertension and biatrial enlargement   . Mitral regurgitation 02/20/2013  . Exertional shortness of breath     "for awhile now" (09/11/2013)  . History of blood transfusion     PAST SURGICAL HISTORY: Past Surgical History  Procedure Laterality Date  . Hemorrhoid surgery  2009    Internal, external hemorrhoidectomy, general anesthesia,prone position. [Other]  . Open reduction and internal fixation of right distal radius fracture using hand innovations distal radius volar locking plate.  07/2005    Dr Ninfa Linden  . Tee without cardioversion N/A 02/17/2013    Procedure: TRANSESOPHAGEAL ECHOCARDIOGRAM (TEE);  Surgeon: [name], MD;  Location: [facility] ENDOSCOPY;  Service: Cardiovascular;  Laterality: N/A;  . Repair of acute type a aortic dissection with resuspension of native aortic valve  10/15/1998    Dr Roxy Manns    FAMILY HISTORY: Family History  Problem Relation Age of Onset  . Thyroid cancer Father     smoked  . Lung cancer Brother     smoked  . Heart attack Mother   . Emphysema Brother     smoked    SOCIAL HISTORY: History   Social History  . Marital Status: Married    Spouse Name: Jackelyn Poling  . Number of Children: 2  . Years of Education: Ph.D   Occupational History  . 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 Smoking status: Never Smoker   . Smokeless tobacco: Never Used  . Alcohol Use: Yes     Comment: 09/11/2013 "might have a drink once/month"  . Drug Use: No  . Sexual Activity: Not Currently   Other Topics Concern  . Not on file   Social History Narrative   Patient lives at home with spouse.   Caffeine Use: eat a lot of chocolate, sodas, coffee and tea occasionally     PHYSICAL EXAM  Filed Vitals:   02/05/15 1533  BP: 155/83  Pulse: 89  Height: _0  (1.727 m)  Weight: 165 lb 6.4 oz (75.025 kg)   Body mass index is 25.15 kg/(m^2).  Generalized: Well developed, in  no acute distress  Neck: Supple, no carotid bruits  Cardiac: IRREG RATE RHYTHM, SYS MURMUR  Neurological examination  MENTAL STATUS: awake, alert, language fluent, comprehension intact, naming intact  CRANIAL NERVE: no papilledema on fundoscopic exam, pupils equal and reactive to light, visual fields full to confrontation, extraocular muscles: SACCADIC BREAKDOWN OF SMOOTH  PURSUIT; END GAZE NYSTAGMUS; PAST POINTING ON SACCADES. Facial sensation and WITH DECR RIGHT EYE CLOSURE WEAKNESS AND RIGHT LOWER FACIAL STRENGTH. SYNKINESES ON RIGHT FACE. Uvula midline, shoulder shrug symmetric, tongue midline.  MOTOR: INCREASED TONE IN BLE > BUE (RIGHT > LEFT). BUE (DELTOID 4+, TRICEP 4, BICEP 5, GRIP 4+), RIGHT LEG (HF 1-2, KE 3, KF 2, DF 1-2), LEFT LEG (HF 3, KE/KF 3, DF 3) SENSORY: DECR IN RLE TO ALL MODALITIES.  COORDINATION: finger-nose-finger, fine finger movements normal  REFLEXES: BUE 3 (R>L), RLE 3, LLE 2.  GAIT/STATION: DIFF STANDING FROM CHAIR. USES A WALKER. SPASTIC GAIT. RIGHT CIRCUMDUCTION. UNSTEADY.    DIAGNOSTIC DATA (LABS, IMAGING, TESTING) - I reviewed patient records, labs, notes, testing and imaging myself where available.  Lab Results  Component Value Date   WBC 5.3 08/02/2014   HGB 12.7 08/02/2014   HCT 38.9 08/02/2014   MCV 87 08/02/2014   PLT 110* 08/02/2014      Component Value Date/Time   NA 147 09/18/2013 0554   K 3.9 09/18/2013 0554   CL 111 09/18/2013 0554   CO2 26 09/18/2013 0554   GLUCOSE 84 09/18/2013 0554   BUN 34* 09/18/2013 0554   CREATININE 1.47* 09/18/2013 0554   CALCIUM 8.9 09/18/2013 0554   PROT 6.6 09/12/2013 0900   ALBUMIN 3.2* 09/12/2013 0900   AST 15 09/12/2013 0900   ALT 17 09/12/2013 0900   ALKPHOS 306* 09/12/2013 0900   BILITOT 0.8 09/12/2013 0900   GFRNONAA 48* 09/18/2013 0554   GFRAA 56* 09/18/2013 0554    11/16/12 MRI brain - There are multiple periventricular and subcortical and pontine chronic demyelinating plaques. There are several T1 black holes. Mild  diffuse atrophy. No acute plaques. In comparison to MRI from 03/26/10, there is no significant change.   08/02/13 MRI brain - multiple brainstem, periventricular, corpus callosal and subcortical white matter hyperintensities compatible with chronic lesions of multiple sclerosis. No enhancing lesions are noted. The presence of T1 black holes and significant atrophy of corpus callosum and cortex indicates chronic disease.  11/16/12 MRI cervical spine - Multiple chronic demyelinating plaques at C2-3, C3-4 and C5-6. No acute plaques. At C5-6: Disc bulging with facet hypertrophy with moderate spinal stenosis, severe right and moderate left foraminal stenosis. At C3-4, C4-5: Disc bulging with mild spinal stenosis and biforaminal stenosis. In comparison to MRI from 03/26/10 there is no significant change.   01/20/13 JCV antibody - positive, index 3.37 (H)    ASSESSMENT AND PLAN  69 y.o. old male with Hypertension; Anemia; Depression; Multiple sclerosis; Thrombocytopenia; Dyslipidemia; Aortic dissection, thoracic; Aortic dissection (11/05/1998); Aneurysm of aortic arch (04/04/2012); Chest pain (11/18/2009); CHF (congestive heart failure), Atrial fibrillation (01/30/2013); Pleural effusion, right, large (01/30/2013); Severe tricuspid valve regurgitation (01/30/2013); Mitral regurgitation (02/20/2013); and Lower extremity edema (02/20/2013) here with multiple sclerosis since 1996. Initially on betaseron, now on tecfidera. Some progression of symptoms since last visit, especially since viral infx while travelling to [place].    PLAN: 1. Continue Tecfidera and Ampyra. 2. No driving.  3. Check labs. 4. Check MRI brain (with and without). 5. Palliative care consult for advanced care planning and family / patient support.   Orders Placed This Encounter  Procedures  . MR Brain W Wo Contrast  . CBC with Differential/Platelet  . Comprehensive metabolic panel  . Vit D  25 hydroxy (rtn osteoporosis monitoring)   Return in  about 6 months (around 08/08/2015).   I spent 40 minutes of face to face time with patient.  Greater than 50% of time was spent in counseling and coordination of care with patient.     Penni Bombard, MD 8/00/3491, 7:91 PM Certified in Neurology, Neurophysiology and Neuroimaging  [facility] [address], [place], [place] [address] [phone]

## 2015-02-05 NOTE — Patient Instructions (Signed)
See the website: [email or web address]  I will setup MRI brain and labs.  Continue tecfidera and ampyra.  Consider home health agency referral if daily activities become more limited / challenging.

## 2015-02-06 ENCOUNTER — Telehealth: Payer: Self-pay

## 2015-02-06 NOTE — Telephone Encounter (Signed)
Is it ok to wait two weeks for Palliative due to there schedule ?

## 2015-02-06 NOTE — Telephone Encounter (Signed)
Yes ok 

## 2015-02-07 NOTE — Telephone Encounter (Signed)
All information has been sent [place] will call patient to schedule apt. Per [name] ok to wait.

## 2015-02-08 ENCOUNTER — Other Ambulatory Visit: Payer: Self-pay | Admitting: Diagnostic Neuroimaging

## 2015-02-08 LAB — CBC WITH DIFFERENTIAL/PLATELET
Basophils Absolute: 0.1 10*3/uL (ref 0.0–0.1)
Basophils Relative: 1 % (ref 0–1)
Eosinophils Absolute: 0.1 10*3/uL (ref 0.0–0.7)
Eosinophils Relative: 2 % (ref 0–5)
HCT: 44.4 % (ref 39.0–52.0)
Hemoglobin: 14.9 g/dL (ref 13.0–17.0)
Lymphocytes Relative: 10 % — ABNORMAL LOW (ref 12–46)
Lymphs Abs: 0.5 10*3/uL — ABNORMAL LOW (ref 0.7–4.0)
MCH: 28.3 pg (ref 26.0–34.0)
MCHC: 33.6 g/dL (ref 30.0–36.0)
MCV: 84.4 fL (ref 78.0–100.0)
MPV: 11.1 fL (ref 8.6–12.4)
Monocytes Absolute: 0.4 10*3/uL (ref 0.1–1.0)
Monocytes Relative: 8 % (ref 3–12)
Neutro Abs: 4 10*3/uL (ref 1.7–7.7)
Neutrophils Relative %: 79 % — ABNORMAL HIGH (ref 43–77)
Platelets: 124 10*3/uL — ABNORMAL LOW (ref 150–400)
RBC: 5.26 MIL/uL (ref 4.22–5.81)
RDW: 14.6 % (ref 11.5–15.5)
WBC: 5 10*3/uL (ref 4.0–10.5)

## 2015-02-08 LAB — COMPREHENSIVE METABOLIC PANEL
ALT: 38 U/L (ref 0–53)
AST: 28 U/L (ref 0–37)
Albumin: 4.1 g/dL (ref 3.5–5.2)
Alkaline Phosphatase: 209 U/L — ABNORMAL HIGH (ref 39–117)
BUN: 26 mg/dL — ABNORMAL HIGH (ref 6–23)
CO2: 29 mEq/L (ref 19–32)
Calcium: 9.7 mg/dL (ref 8.4–10.5)
Chloride: 101 mEq/L (ref 96–112)
Creat: 1.31 mg/dL (ref 0.50–1.35)
Glucose, Bld: 85 mg/dL (ref 70–99)
Potassium: 4.3 mEq/L (ref 3.5–5.3)
Sodium: 141 mEq/L (ref 135–145)
Total Bilirubin: 0.9 mg/dL (ref 0.2–1.2)
Total Protein: 7.3 g/dL (ref 6.0–8.3)

## 2015-02-09 LAB — VITAMIN D 25 HYDROXY (VIT D DEFICIENCY, FRACTURES): Vit D, 25-Hydroxy: 19 ng/mL — ABNORMAL LOW (ref 30–100)

## 2015-02-16 ENCOUNTER — Inpatient Hospital Stay: Admission: RE | Admit: 2015-02-16 | Payer: Medicare Other | Source: Ambulatory Visit

## 2015-02-20 ENCOUNTER — Ambulatory Visit: Payer: Medicare Other | Attending: Diagnostic Neuroimaging

## 2015-02-20 DIAGNOSIS — R269 Unspecified abnormalities of gait and mobility: Secondary | ICD-10-CM

## 2015-02-20 NOTE — Therapy (Signed)
[facility] [address] [place], [place], [address] Phone: [phone]   Fax:  [phone]  Physical Therapy Evaluation  Patient Details  Name: [name] MRN: [id number] Date of Birth: 1945/12/03 Referring Provider:  Levin Erp, MD  Encounter Date: 02/20/2015      PT End of Session - 02/20/15 1540    Visit Number 1   Number of Visits 1   Date for PT Re-Evaluation 02/20/15   Authorization Type G-code   PT Start Time 1320  pt arrived late   PT Stop Time 1400   PT Time Calculation (min) 40 min   Equipment Utilized During Treatment Gait belt   Activity Tolerance Patient tolerated treatment well   Behavior During Therapy [facility] for tasks assessed/performed      Past Medical History  Diagnosis Date  . Neuromuscular disorder   . Hypertension   . Anemia   . Depression   . Multiple sclerosis   . Thrombocytopenia   . Dyslipidemia   . Aortic dissection, thoracic   . BPH (benign prostatic hyperplasia)   . Hemorrhoids   . Aortic dissection 11/05/1998    S/P emergency repair of acute type A aortic dissection with resuspension of native aortic valve  . 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 CHF (congestive heart failure) 02/14/1947    2D Echo - EF 50-55%, mild-moderately dilated right ventricle, mild-moderate tricuspid valve regurgitation, moderately dilated right atrium  . Bilateral lower extremity edema   . Atrial fibrillation 01/30/2013    Atrial fibrillation   . Pleural effusion, right, large 01/30/2013  . Severe tricuspid valve regurgitation 01/30/2013    Severe tricuspid regurg by recent 2-D echo with a dilated tricuspid annulus, moderate pulmonary hypertension and biatrial enlargement   . Mitral regurgitation 02/20/2013  . Exertional shortness of breath     "for awhile now"  (09/11/2013)  . History of blood transfusion     Past Surgical History  Procedure Laterality Date  . Hemorrhoid surgery  2009    Internal, external hemorrhoidectomy, general anesthesia,prone position. [Other]  . Open reduction and internal fixation of right distal radius fracture using hand innovations distal radius volar locking plate.  07/2005    Dr Ninfa Linden  . Tee without cardioversion N/A 02/17/2013    Procedure: TRANSESOPHAGEAL ECHOCARDIOGRAM (TEE);  Surgeon: [name], MD;  Location: [facility] ENDOSCOPY;  Service: Cardiovascular;  Laterality: N/A;  . Repair of acute type a aortic dissection with resuspension of native aortic valve  10/15/1998    Dr Roxy Manns    There were no vitals filed for this visit.  Visit Diagnosis:  Abnormality of gait - Plan: PT plan of care cert/re-cert      Subjective Assessment - 02/20/15 1327    Subjective Pt reported he was doing great after PT but then he got the a virus about 3-4 weeks ago that increased MS symptoms and was unable to swim and perform HEP during that time. He has slowly started to perform HEP again and MD told him to wait to swim until next week.  Pt reported his main concern is that he received AFO and would like instructions on how to use it.   Pertinent History MS, Hx of aortic dissection, CHF, HTN, a-fib   Patient Stated Goals use AFO correctly    Currently in Pain? No/denies  [facility] PT Assessment - 02/20/15 1333    Assessment   Medical Diagnosis MS   Onset Date/Surgical Date 01/30/15   Prior Therapy OPPT neuro 09/2014   Precautions   Precautions Fall;Other (comment)  can not lifting/straining due to aorta dissection   Restrictions   Weight Bearing Restrictions No   Balance Screen   Has the patient fallen in the past 6 months Yes   How many times? 5  in [place]   Has the patient had a decrease in activity level because of a fear of falling?  Yes   Is the patient reluctant to leave their home because of a fear of  falling?  No   Home Social worker Private residence   Living Arrangements Spouse/significant other   Available Help at Discharge Family   Type of [place] to enter;Ramped entrance   Entrance Stairs-Number of Steps 2   Entrance Stairs-Rails Can reach both   [place] Two level;Able to live on main level with bedroom/bathroom  pt has a chair for flight of stairs, but chair is broken.    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 L UE/LE   Coordination   Gross Motor Movements are Fluid and Coordinated Yes   Fine Motor Movements are Fluid and Coordinated No   Posture/Postural Control   Posture/Postural Control Postural limitations   Postural Limitations Forward head;Rounded Shoulders   Tone   Assessment Location Right Lower Extremity  unable to assess fully, as pt answered a phone call during session   ROM / Strength   AROM / PROM / Strength AROM;Strength   AROM   Overall AROM  Deficits   Overall AROM Comments Decrease R knee flexion AROM, able to obtain full flexion during PROM wiith pain in knee.   Strength   Overall Strength Deficits   Overall Strength Comments L LE and B UE WFL. R hip flex: 3+/5, R quad: 4/5, R hamstring: 2/5, R dorsiflexion: 2/5. R LE strength has not changed since last visit, except decreased  hamstring strength.   Transfers   Transfers Sit to Stand;Stand to Sit   Sit to Stand With upper extremity assist;From chair/3-in-1;6: Modified independent (Device/Increase time)   Stand to Sit With upper extremity assist;To chair/3-in-1;6: Modified independent (Device/Increase time)   Ambulation/Gait   Ambulation/Gait Yes   Ambulation/Gait Assistance 6: Modified independent (Device/Increase time)   Ambulation/Gait Assistance Details Pt ambulated over even terrain with R AFO donned. Cues to improve R knee flexion. No overt LOB noted.   Ambulation Distance (Feet) 200 Feet   Assistive device Rolling walker   Gait Pattern Decreased hip/knee flexion - right;Decreased dorsiflexion - right;Right circumduction;Decreased stride length   Ambulation Surface Level;Indoor   Gait velocity 1.11ft/sec.  However, pt slowed in order to increase R knee flexion  Balance   Balance Assessed Yes   Static Standing Balance   Static Standing - Balance Support No upper extremity supported   Static Standing - Level of Assistance Other (comment);5: Stand by assistance  min guard   Static Standing - Comment/# of Minutes Pt able to stand without UE support for 30 seconds with feet apart without LOB.   RLE Tone   RLE Tone Hypertonic                           PT Education - 02/22/15 1536    Education provided Yes   Education Details Reviewed R hip/knee flexion stretch. Pt demonstrated proper donning/doffing of AFO and exhibited improve R toe clearance during ambulation with R AFO donned. PT reiterated the importance of donning AFO without wrinkles in socks to ensure skin integrity. PT educated pt on slowly increasing reps and HEP exercises, in order to improve strength, endurance, and balance. PT educated pt that this is an eval and treat only today for AFO training and stretches to improve R knee flexion, as pt was recently discharged from PT 2-3 months ago and has the  strengthening/endurance/balance exercises in HEP to improve current deficits. Pt agreeable.   Person(s) Educated Patient   Methods Explanation;Demonstration;Verbal cues   Comprehension Verbalized understanding;Returned demonstration          PT Short Term Goals - 02/22/2015 1545    PT SHORT TERM GOAL #1   Title eval only           PT Long Term Goals - 22-Feb-2015 1545    PT LONG TERM GOAL #1   Title eval only               Plan - 2015-02-22 1540    Clinical Impression Statement Pt is a pleasant 69y/o male presenting to PT for AFO training. Pt has a history of MS and recently discharged from OPPT neuro 3 months ago. Pt presented with decreased gait speed, decreased R knee flexion, decreased strength and endurance due to recent virus which exacerbated MS symptoms. However, pt reported his MD stated pt can start swimming again next week and pt reported he already feels better and has been performing some of his PT HEP. PT provided pt with R hip/knee flexion stretch to improve R knee flexion during ambulation. Pt demonstrated proper donn/doffing of R AFO and was able to ambulate with improve R toe clearance with AFO. Eval and one time treat only today, as pt already has PT HEP from several months ago and can verbalize and demonstrate proper managing of R AFO after instuction. Also, pt noted to take a phone call regarding broken stair chair lift during session.   PT Frequency One time visit   PT Next Visit Plan EVal only.   Consulted and Agree with Plan of Care Patient          G-Codes - 02-22-2015 1545    Functional Assessment Tool Used Pt required cues during ambulation with R AFO donned for sequencing with RW and AFO, however, pt progressed to no cues by end of session.   Functional Limitation Self care   Self Care Current Status [phone]) At least 1 percent but less than 20 percent impaired, limited or restricted   Self Care Goal Status [id number]) At least 1 percent but less than 20  percent impaired, limited or restricted   Self Care Discharge Status [phone]) At least 1 percent but less than 20  percent impaired, limited or restricted       Problem List Patient Active Problem List   Diagnosis Date Noted  . Aneurysm of aortic arch 12/31/2014  . Tricuspid regurgitation 10/08/2014  . RVF (right ventricular failure) 10/08/2014  . Chronic diastolic congestive heart failure 10/09/2013  . Sinus pause 09/16/2013  . Anemia due to blood loss, acute in setting of supratheraputic INR 09/11/13 09/12/2013  . 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 Acute renal insufficiency 09/12/2013  . Anasarca 09/12/2013  . Anemia 09/12/2013  . Bilateral leg edema 09/11/2013  . Acute right-sided congestive heart failure 09/11/2013  . Unstable gait 09/11/2013  . Long term (current) use of anticoagulants 04/11/2013  . Dissection of aorta, thoracic- surgery Feb 2000 02/20/2013  . Mitral regurgitation 02/20/2013  . Lower extremity edema 02/20/2013  . Persistent atrial fibrillation 01/30/2013  . Volume overload 01/30/2013  . Severe tricuspid valve regurgitation 01/30/2013  . Pleural effusion, right, large 01/30/2013  . MS (multiple sclerosis) 01/13/2013  . Thrombocytopenia 07/26/2011  . Aortic dissection- chronic abd dissection  11/05/1998    Amamda Curbow L 02/20/2015, 3:51 PM  [facility] [address] [place], [place], [address] Phone: [phone]   Fax:  [phone]    Geoffry Paradise, PT,DPT 02/20/2015 3:51 PM Phone: [phone] Fax: [phone]

## 2015-02-21 NOTE — Telephone Encounter (Signed)
errror

## 2015-02-23 ENCOUNTER — Other Ambulatory Visit: Payer: Self-pay | Admitting: Pharmacist Clinician (PhC)/ Clinical Pharmacy Specialist

## 2015-02-24 ENCOUNTER — Ambulatory Visit
Admission: RE | Admit: 2015-02-24 | Discharge: 2015-02-24 | Disposition: A | Discharge status: Home / Self Care | Payer: Medicare Other | Source: Ambulatory Visit | Attending: Diagnostic Neuroimaging | Admitting: Diagnostic Neuroimaging

## 2015-02-24 DIAGNOSIS — G35 Multiple sclerosis: Secondary | ICD-10-CM

## 2015-02-24 MED ORDER — GADOBENATE DIMEGLUMINE 529 MG/ML IV SOLN: 10.0000 mL | Freq: Once | INTRAVENOUS | Status: AC | PRN

## 2015-02-25 ENCOUNTER — Telehealth: Payer: Self-pay | Admitting: Diagnostic Neuroimaging

## 2015-02-25 ENCOUNTER — Other Ambulatory Visit: Payer: Self-pay | Admitting: *Deleted

## 2015-02-25 DIAGNOSIS — G35 Multiple sclerosis: Secondary | ICD-10-CM

## 2015-02-25 NOTE — Telephone Encounter (Signed)
Josh NP called back in and spoke with me about the pt, thinking after his in home visit thinking that the pt would benefit from a wheelchair. I told him that [name] would be happy to order that and that I would discuss it with him. He thanked me and I thanked him for calling me back

## 2015-02-25 NOTE — Telephone Encounter (Signed)
Josh with [facility] called and requested to speak with the nurse regarding the patient. Please call and advise.

## 2015-02-25 NOTE — Telephone Encounter (Signed)
Left a message with [name] to call me back when he was able

## 2015-02-27 ENCOUNTER — Encounter: Payer: Self-pay | Admitting: *Deleted

## 2015-02-27 ENCOUNTER — Other Ambulatory Visit: Payer: Self-pay | Admitting: *Deleted

## 2015-02-27 ENCOUNTER — Telehealth: Payer: Self-pay | Admitting: *Deleted

## 2015-02-27 DIAGNOSIS — E559 Vitamin D deficiency, unspecified: Secondary | ICD-10-CM

## 2015-02-27 DIAGNOSIS — G35 Multiple sclerosis: Secondary | ICD-10-CM

## 2015-02-27 NOTE — Telephone Encounter (Signed)
Spoke to [name] who stated that there was no change in the MRI since 2014. Pt was pleased with this and thanked me for being so prompt with getting back to him

## 2015-02-27 NOTE — Telephone Encounter (Signed)
Talked with [name] who asked me to call the pt and inform him about the lab work:  CBC is stable; platelets are improved. Lymphocytes are stable.  LFTs show increased alk-phos. Will need to repeat in 1 month.  Agree with Vit D replacement of 2000 IU daily for 3-6 months and then recheck labs  The pt stated an understanding and asked that I send him a my chart message about these things. He thanked me

## 2015-03-11 ENCOUNTER — Other Ambulatory Visit: Payer: Self-pay

## 2015-03-12 ENCOUNTER — Other Ambulatory Visit: Payer: Self-pay | Admitting: Diagnostic Neuroimaging

## 2015-03-14 ENCOUNTER — Telehealth: Payer: Self-pay

## 2015-03-14 ENCOUNTER — Encounter: Payer: Self-pay | Admitting: Diagnostic Neuroimaging

## 2015-03-14 NOTE — Telephone Encounter (Signed)
[place] has approved the request for coverage on [employer] effective until 03/13/2016 Ref # [id number]

## 2015-03-14 NOTE — Telephone Encounter (Signed)
[place] has approved the request for coverage on [employer] effective until 03/13/2016 Ref # [id number] Sent patient a message via [place] regarding the decision.

## 2015-03-15 ENCOUNTER — Ambulatory Visit (INDEPENDENT_AMBULATORY_CARE_PROVIDER_SITE_OTHER): Payer: Medicare Other | Admitting: Pharmacist Clinician (PhC)/ Clinical Pharmacy Specialist

## 2015-03-15 DIAGNOSIS — Z7901 Long term (current) use of anticoagulants: Secondary | ICD-10-CM

## 2015-03-15 DIAGNOSIS — I4819 Other persistent atrial fibrillation: Secondary | ICD-10-CM

## 2015-03-15 DIAGNOSIS — I4891 Unspecified atrial fibrillation: Secondary | ICD-10-CM

## 2015-03-15 DIAGNOSIS — I481 Persistent atrial fibrillation: Secondary | ICD-10-CM | POA: Diagnosis not present

## 2015-03-15 LAB — POCT INR: INR: 1.7

## 2015-03-28 ENCOUNTER — Telehealth: Payer: Self-pay | Admitting: Diagnostic Neuroimaging

## 2015-03-28 NOTE — Telephone Encounter (Signed)
Received call from [place], [place] with [facility]osp[facility] re: wheelchair order. Informed him that order was found, but unsure if it was faxed. He stated to fax to [facility].  Will send staff message to [place] to inform prior to faxing order. He verbalized understanding, appreciation.

## 2015-03-29 NOTE — Telephone Encounter (Signed)
Left vm requesting call back next Monday to schedule FU with Dr [name] to discuss wheelchair need. Left this caller's name, number and office hours on Monday.

## 2015-04-01 NOTE — Telephone Encounter (Signed)
Spoke with patient informing him that he needs FU with Dr [name] in order for his wheelchair to be obtained. He stated he was "waiting for the [facility]" and couldn't schedule appointment. He stated he could be reached "at any time tomorrow".

## 2015-04-02 ENCOUNTER — Telehealth: Payer: Self-pay | Admitting: Diagnostic Neuroimaging

## 2015-04-02 NOTE — Telephone Encounter (Signed)
Pt called stating that someone called from this office . He would like a call back [phone].

## 2015-04-02 NOTE — Telephone Encounter (Signed)
Needs labs once a year. -VRP

## 2015-04-02 NOTE — Telephone Encounter (Addendum)
Spoke with patient and scheduled FU in order for patient to receive wheelchair. Patient requests that Dr [name] be asked "does he need labs?". Informed patient this RN will ask dr. Patient verbalized understanding.   3:10 pm  Spoke with patient and informed him that per Dr [name], he only needs labs once a year. Patient verbalized understanding, appreciation for this call.

## 2015-04-05 ENCOUNTER — Ambulatory Visit (INDEPENDENT_AMBULATORY_CARE_PROVIDER_SITE_OTHER): Payer: Medicare Other | Admitting: Pharmacist Clinician (PhC)/ Clinical Pharmacy Specialist

## 2015-04-05 DIAGNOSIS — I4891 Unspecified atrial fibrillation: Secondary | ICD-10-CM | POA: Diagnosis not present

## 2015-04-05 DIAGNOSIS — I481 Persistent atrial fibrillation: Secondary | ICD-10-CM | POA: Diagnosis not present

## 2015-04-05 DIAGNOSIS — Z7901 Long term (current) use of anticoagulants: Secondary | ICD-10-CM

## 2015-04-05 DIAGNOSIS — I4819 Other persistent atrial fibrillation: Secondary | ICD-10-CM

## 2015-04-05 LAB — POCT INR: INR: 1.6

## 2015-04-16 ENCOUNTER — Encounter: Payer: Self-pay | Admitting: Diagnostic Neuroimaging

## 2015-04-16 ENCOUNTER — Ambulatory Visit (INDEPENDENT_AMBULATORY_CARE_PROVIDER_SITE_OTHER): Payer: Medicare Other | Admitting: Diagnostic Neuroimaging

## 2015-04-16 VITALS — BP 136/73 | HR 91 | Wt 164.2 lb

## 2015-04-16 DIAGNOSIS — G35D Multiple sclerosis, unspecified: Secondary | ICD-10-CM

## 2015-04-16 DIAGNOSIS — G35 Multiple sclerosis: Secondary | ICD-10-CM

## 2015-04-16 NOTE — Progress Notes (Signed)
PATIENT: [name] DOB: [birth year]   REASON FOR VISIT: follow up for MS HISTORY FROM: patient and wife  Chief Complaint  Patient presents with  . MS    rm 7, wife  . Follow-up    for wheelchair     HISTORY OF PRESENT ILLNESS:  UPDATE 04/16/15: Since last visit, patient is stable. Getting home visits with palliative care.   UPDATE 02/05/15: Discussed palliative care consult and options.   UPDATE 08/02/14: Since last visit, feels stable. Tolerating tecfidera and ampyra. No new events. Uses cane and walker. Swimming more. Wife notes memory issues slightly worse.   UPDATE 01/31/14: Since last visit, continues on tecfidera. No progression of neuro sxs or MS sxs. Asking about driving again. Has not driven in last 3-4 months. Ongoing medical mgmt of his cardiac issues, and he is not a surgical candidate.  UPDATE 10/25/13 (LL): Patient comes in for revisit, since last visit he had worsening right-sided HF and was hospitalized.  He was seen by [name] for surgical consultation to consider tricuspid valve repair and possible Maze procedure.  He was referred to the advanced heart failure clinic who has been adjusting his lasix to manage his lower extremity edema and shortness of breath.  He is to be seen by [name] again for follow up in another month, and says [name] felt that surgery was needed.  He comes in today to discuss with [name] how his MS would be affected by having the surgery.  His wife feels like he is having more difficulty walking since last visit, but he has not had any falls.  He is tolerating Tecfidera and Ampyra well.  UPDATE 07/19/13 (VP): Patient continues to have progressive deterioration in cognitive ability, memory, mood lability, gait stability. Patient is on tecfidera now, and tolerating without side effects. Patient feels when he came off of Betaseron he had some accelerated progression of MS that this has slowed down since starting tecfidera.  Unclear whether patient's current level of functioning is on the same trajectory as the decline that was noted while patient was on Betaseron, or whether there has been some acceleration of deterioration. Other factors include some marital discord and comorbid depression. Other contributing factors include his cardiac issues including atrial fibrillation, on Coumadin, as well as severe tricuspid valve regurgitation leading to peripheral edema. Unfortunately patient is not felt to be a good surgical candidate.   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 The patient is accompanied by his wife. Pt. States he is doing worse since his last visit in Feb. He is walking slower and having more difficulty standing from a seated position. 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 [name] had talked to him about changing MS medications and he thinks he wants to. Having more memory problems. Continuing on betaseron right now.   PRIOR HPI (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 with a history of multiple sclerosis diagnosed in September 1996. He has been on Betaseron since 1996 and has right leg spasticity requiring him to use a cane in his left hand.He does not have any significant side effects from the Betaseron. Blood studies 02/10/2012 are normal CBC and CMP except for a low platelet count of 63K, Hgb 12.1 and alk Phos of 253. This is being followed by [name]  and [name]. He has never had lymphopenia or neutropenia. There is no change in his symptoms. He denies bowel or bladder incontinence, weakness, Lhermitte's sign, or double vision. He swims 3 times per week for 10 laps which is a little over a quarter of a mile, claims he is winded afterwards. He has not fallen, uses a cane in his left hand. MRI of the brain and cervical spine  with and without contrast enhancement 03/26/2010 showed multiple subcortical, periventricular, and brainstem white matter hyperintensities without enhancing lesions present. There were T1 black holes and atrophy present. The cervical MRI showed a large central disc protrusion at C5-6 and spinal cord hyperintensities at C4 and brain stem representing remote demyelinating plaques without enhancing lesions present. Ampyra was started November 2012 with improvement in gait. He was pleased with the addition of the drug and his response. Pt denies injection site problems however he says he is tired of shots. He has been on [employer] since 1996 and we have talked about whether changing to an oral agent should be considered. He had several falls with his right leg giving way on standing. He has right knee pain. He states his memory is worse. He denies numbness delusions or depression. 07/11/2012= (MMSE29/30. Clock drawing task4/4. Animal fluency test 17). His wife has noted more problems walking which the patient is hesitant to discuss. He does admit since calling me for a work in appointment , that he believes he is having more difficulty walking. He uses a walker at home and is noticeably slower. He uses the walker to get out of a bed that is low and out of a chair. He notices shortness of breath on swimming. His swallowing is okay except for occasional difficulty with liquids. He has right knee pain when walking. Betaseron seems to help his knee pain, walking, and his eyes after an injection. He is hesitant to go off of the medication for 3 weeks. He has increased spasms in his right foot and leg that are also moving to the left leg. He has swelling in his legs treated with furosemide.    REVIEW OF SYSTEMS: Full 14 system review of systems performed and notable only as per HPI.   ALLERGIES: No Known Allergies  HOME MEDICATIONS: Outpatient Prescriptions Prior to Visit  Medication Sig Dispense Refill  .  Cholecalciferol (VITAMIN D3) 2000 UNITS TABS Take 1 capsule by mouth daily.    Marland Kitchen dalfampridine (AMPYRA) 10 MG TB12 Take 1 tablet (10 mg total) by mouth 2 (two) times daily. 180 tablet 1  . ferrous sulfate 325 (65 FE) MG tablet Take 1 tablet (325 mg total) by mouth 3 (three) times daily with meals. 90 tablet 1  . folic acid (FOLVITE) 1 MG tablet Take 1 mg by mouth daily.      . furosemide (LASIX) 40 MG tablet Take 1 tablet (40 mg total) by mouth 2 (two) times daily. 180 tablet 3  . potassium chloride (K-DUR) 10 MEQ tablet TAKE (2) TABLETS DAILY. 60 tablet 3  . QUEtiapine (SEROQUEL) 25 MG tablet Take 25 mg by mouth at bedtime as needed.     . rosuvastatin (CRESTOR) 5 MG tablet Take 5 mg by mouth daily.      Marland Kitchen senna-docusate (SENOKOT-S) 8.6-50 MG per tablet Take 2 tablets by mouth daily.    . TECFIDERA 240 MG CPDR TAKE 1 CAPSULE TWICE A DAY 180 capsule 3  . triamcinolone cream (KENALOG) 0.1 %     . warfarin (COUMADIN) 2  MG tablet TAKE 1 TO 1&1/2 TABLETS DAILY AS DIRECTED BY THE COUMADIN CLINIC. 120 tablet 1  . NON FORMULARY Take 2 tablets by mouth daily. OTC Laxative     No facility-administered medications prior to visit.    PAST MEDICAL HISTORY: Past Medical History  Diagnosis Date  . Neuromuscular disorder   . Hypertension   . Anemia   . Depression   . Multiple sclerosis   . Thrombocytopenia   . Dyslipidemia   . Aortic dissection, thoracic   . BPH (benign prostatic hyperplasia)   . Hemorrhoids   . Aortic dissection 11/05/1998    S/P emergency repair of acute type A aortic dissection with resuspension of native aortic valve  . 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 CHF (congestive heart failure) Sep 12, 1946    2D Echo - EF 50-55%, mild-moderately dilated right ventricle, mild-moderate tricuspid valve regurgitation, moderately dilated right atrium  .  Bilateral lower extremity edema   . Atrial fibrillation 01/30/2013    Atrial fibrillation   . Pleural effusion, right, large 01/30/2013  . Severe tricuspid valve regurgitation 01/30/2013    Severe tricuspid regurg by recent 2-D echo with a dilated tricuspid annulus, moderate pulmonary hypertension and biatrial enlargement   . Mitral regurgitation 02/20/2013  . Exertional shortness of breath     "for awhile now" (09/11/2013)  . History of blood transfusion     PAST SURGICAL HISTORY: Past Surgical History  Procedure Laterality Date  . Hemorrhoid surgery  2009    Internal, external hemorrhoidectomy, general anesthesia,prone position. [Other]  . Open reduction and internal fixation of right distal radius fracture using hand innovations distal radius volar locking plate.  07/2005    Dr Ninfa Linden  . Tee without cardioversion N/A 02/17/2013    Procedure: TRANSESOPHAGEAL ECHOCARDIOGRAM (TEE);  Surgeon: [name], MD;  Location[facility] ENDOSCOPY;  Service: Cardiovascular;  Laterality: N/A;  . Repair of acute type a aortic dissection with resuspension of native aortic valve  10/15/1998    Dr Roxy Manns    FAMILY HISTORY: Family History  Problem Relation Age of Onset  . Thyroid cancer Father     smoked  . Lung cancer Brother     smoked  . Heart attack Mother   . Emphysema Brother     smoked    SOCIAL HISTORY: History   Social History  . Marital Status: Married    Spouse Name: Jackelyn Poling  . Number of Children: 2  . Years of Education: Ph.D   Occupational History  . 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 Smoking status: Never Smoker   . Smokeless tobacco: Never Used  . Alcohol Use: Yes     Comment: 09/11/2013 "might have a drink once/month"  . Drug Use: No  . Sexual Activity: Not Currently   Other Topics Concern  . Not on file   Social History Narrative   Patient lives at home with spouse.   Caffeine Use: eat a lot of chocolate, sodas,  coffee and tea occasionally     PHYSICAL EXAM  Filed Vitals:   04/16/15 1347  BP: 136/73  Pulse: 91  Weight: 164 lb 3.2 oz (74.481 kg)   Body mass index is 24.97 kg/(m^2).  Generalized: Well developed, in no acute distress  Neck: Supple, no carotid bruits  Cardiac: REG RATE RHYTHM,  SYS MURMUR  Neurological examination  MENTAL STATUS: awake, alert, language fluent, comprehension intact, naming intact  CRANIAL NERVE: pupils equal and reactive to light, visual fields full to confrontation, extraocular muscles: SACCADIC BREAKDOWN OF SMOOTH PURSUIT; END GAZE NYSTAGMUS; PAST POINTING ON SACCADES. Facial sensation and WITH DECR RIGHT EYE CLOSURE WEAKNESS AND RIGHT LOWER FACIAL STRENGTH. SYNKINESES ON RIGHT FACE. Uvula midline, shoulder shrug symmetric, tongue midline.  MOTOR: INCREASED TONE IN BLE > BUE (RIGHT > LEFT). BUE (DELTOID 4+, TRICEP 4, BICEP 5, GRIP 4+), RIGHT LEG (HF 1-2, KE 3, KF 2, DF 1-2), LEFT LEG (HF 3, KE/KF 3, DF 3) SENSORY: DECR IN RLE TO ALL MODALITIES.  COORDINATION: finger-nose-finger, fine finger movements normal  REFLEXES: BUE 3 (R>L), RLE 3, LLE 2.  GAIT/STATION: DIFF STANDING FROM CHAIR. USES A WALKER. SPASTIC GAIT. RIGHT CIRCUMDUCTION. UNSTEADY.    DIAGNOSTIC DATA (LABS, IMAGING, TESTING) - I reviewed patient records, labs, notes, testing and imaging myself where available.  Lab Results  Component Value Date   WBC 5.0 02/08/2015   HGB 14.9 02/08/2015   HCT 44.4 02/08/2015   MCV 84.4 02/08/2015   PLT 124* 02/08/2015   CMP Latest Ref Rng 02/08/2015 01/01/2015 12/31/2014  Glucose 70 - 99 mg/dL 85 - -  BUN 6 - 23 mg/dL 26(H) - 25(H)  Creatinine 0.50 - 1.35 mg/dL 1.31 1.40(H) -  Sodium 135 - 145 mEq/L 141 - -  Potassium 3.5 - 5.3 mEq/L 4.3 - -  Chloride 96 - 112 mEq/L 101 - -  CO2 19 - 32 mEq/L 29 - -  Calcium 8.4 - 10.5 mg/dL 9.7 - -  Total Protein 6.0 - 8.3 g/dL 7.3 - -  Total Bilirubin 0.2 - 1.2 mg/dL 0.9 - -  Alkaline Phos 39 - 117 U/L 209(H) - -    AST 0 - 37 U/L 28 - -  ALT 0 - 53 U/L 38 - -   LYMPH#  Date Value Ref Range Status  02/10/2012 0.9 0.9 - 3.3 10e3/uL Final  08/10/2011 1.1 0.9 - 3.3 10e3/uL Final  03/12/2010 0.8* 0.9 - 3.3 10e3/uL Final   LYMPHOCYTES ABSOLUTE  Date Value Ref Range Status  08/02/2014 0.5* 0.7 - 3.1 x10E3/uL Final  01/31/2014 0.3* 0.7 - 3.1 x10E3/uL Final  07/19/2013 0.7 0.7 - 3.1 x10E3/uL Final   LYMPHS ABS  Date Value Ref Range Status  02/08/2015 0.5* 0.7 - 4.0 K/uL Final  02/10/2008 0.7  Final  01/12/2008 0.7  Final    11/16/12 MRI brain - There are multiple periventricular and subcortical and pontine chronic demyelinating plaques. There are several T1 black holes. Mild diffuse atrophy. No acute plaques. In comparison to MRI from 03/26/10, there is no significant change.   08/02/13 MRI brain - multiple brainstem, periventricular, corpus callosal and subcortical white matter hyperintensities compatible with chronic lesions of multiple sclerosis. No enhancing lesions are noted. The presence of T1 black holes and significant atrophy of corpus callosum and cortex indicates chronic disease.  11/16/12 MRI cervical spine - Multiple chronic demyelinating plaques at C2-3, C3-4 and C5-6. No acute plaques. At C5-6: Disc bulging with facet hypertrophy with moderate spinal stenosis, severe right and moderate left foraminal stenosis. At C3-4, C4-5: Disc bulging with mild spinal stenosis and biforaminal stenosis. In comparison to MRI from 03/26/10 there is no significant change.   01/20/13 JCV antibody - positive, index 3.37 (H)    ASSESSMENT AND PLAN  69 y.o. old male with Hypertension; Anemia; Depression; Multiple sclerosis; Thrombocytopenia; Dyslipidemia; Aortic dissection, thoracic;  Aortic dissection (11/05/1998); Aneurysm of aortic arch (04/04/2012); Chest pain (11/18/2009); CHF (congestive heart failure), Atrial fibrillation (01/30/2013); Pleural effusion, right, large (01/30/2013); Severe tricuspid valve regurgitation  (01/30/2013); Mitral regurgitation (02/20/2013); and Lower extremity edema (02/20/2013) here with multiple sclerosis since 1996. Initially on betaseron, now on tecfidera. Some progression of symptoms since last visit, especially since viral infx while travelling to [place].    PLAN: 1. Continue Tecfidera and Ampyra. 2. No driving.  3. Continue palliative care  4. Wheelchair evaluation --> Patient suffers from multiple sclerosis which impairs their ability to perform daily activities like bathing, dressing and grooming in the home. A walker will not resolve issue with performing activities of daily living. A wheelchair will allow patient to safely perform daily activities. Patient is not able to propel themselves in the home using a standard weight wheelchair due to general weakness. Patient can self propel in the lightweight wheelchair.  Accessories: elevating leg rests (ELRs), wheel locks, extensions and anti-tippers. I evaluated patient face-face on 04/16/15 for this purpose.  Orders Placed This Encounter  Procedures  . For home use only DME lightweight manual wheelchair with seat cushion   Return in about 6 months (around 10/17/2015).     Penni Bombard, MD 04/14/3886, 1:95 PM Certified in Neurology, Neurophysiology and Neuroimaging  [facility] [address], [place], [place] [address] [phone]

## 2015-04-19 ENCOUNTER — Ambulatory Visit: Payer: Medicare Other | Admitting: Pharmacist Clinician (PhC)/ Clinical Pharmacy Specialist

## 2015-04-24 ENCOUNTER — Ambulatory Visit (INDEPENDENT_AMBULATORY_CARE_PROVIDER_SITE_OTHER): Payer: Medicare Other | Admitting: Pharmacist Clinician (PhC)/ Clinical Pharmacy Specialist

## 2015-04-24 DIAGNOSIS — I481 Persistent atrial fibrillation: Secondary | ICD-10-CM | POA: Diagnosis not present

## 2015-04-24 DIAGNOSIS — Z7901 Long term (current) use of anticoagulants: Secondary | ICD-10-CM

## 2015-04-24 DIAGNOSIS — I4819 Other persistent atrial fibrillation: Secondary | ICD-10-CM

## 2015-04-24 DIAGNOSIS — I4891 Unspecified atrial fibrillation: Secondary | ICD-10-CM

## 2015-04-24 LAB — POCT INR: INR: 1.9

## 2015-05-15 ENCOUNTER — Ambulatory Visit (INDEPENDENT_AMBULATORY_CARE_PROVIDER_SITE_OTHER): Payer: Medicare Other | Admitting: Pharmacist Clinician (PhC)/ Clinical Pharmacy Specialist

## 2015-05-15 DIAGNOSIS — I4891 Unspecified atrial fibrillation: Secondary | ICD-10-CM | POA: Diagnosis not present

## 2015-05-15 DIAGNOSIS — Z7901 Long term (current) use of anticoagulants: Secondary | ICD-10-CM

## 2015-05-15 DIAGNOSIS — I481 Persistent atrial fibrillation: Secondary | ICD-10-CM

## 2015-05-15 DIAGNOSIS — I4819 Other persistent atrial fibrillation: Secondary | ICD-10-CM

## 2015-05-15 LAB — POCT INR: INR: 1.7

## 2015-05-31 ENCOUNTER — Ambulatory Visit (INDEPENDENT_AMBULATORY_CARE_PROVIDER_SITE_OTHER): Payer: Medicare Other | Admitting: Pharmacist Clinician (PhC)/ Clinical Pharmacy Specialist

## 2015-05-31 ENCOUNTER — Other Ambulatory Visit: Payer: Self-pay | Admitting: Internal Medicine

## 2015-05-31 DIAGNOSIS — I481 Persistent atrial fibrillation: Secondary | ICD-10-CM

## 2015-05-31 DIAGNOSIS — I4891 Unspecified atrial fibrillation: Secondary | ICD-10-CM | POA: Diagnosis not present

## 2015-05-31 DIAGNOSIS — Z7901 Long term (current) use of anticoagulants: Secondary | ICD-10-CM | POA: Diagnosis not present

## 2015-05-31 DIAGNOSIS — I4819 Other persistent atrial fibrillation: Secondary | ICD-10-CM

## 2015-05-31 LAB — POCT INR: INR: 1.6

## 2015-06-14 ENCOUNTER — Ambulatory Visit (INDEPENDENT_AMBULATORY_CARE_PROVIDER_SITE_OTHER): Payer: Medicare Other | Admitting: Pharmacist Clinician (PhC)/ Clinical Pharmacy Specialist

## 2015-06-14 DIAGNOSIS — I4891 Unspecified atrial fibrillation: Secondary | ICD-10-CM | POA: Diagnosis not present

## 2015-06-14 DIAGNOSIS — Z7901 Long term (current) use of anticoagulants: Secondary | ICD-10-CM

## 2015-06-14 DIAGNOSIS — I481 Persistent atrial fibrillation: Secondary | ICD-10-CM

## 2015-06-14 LAB — POCT INR: INR: 2.2

## 2015-07-03 ENCOUNTER — Other Ambulatory Visit: Payer: Self-pay | Admitting: Internal Medicine

## 2015-07-05 ENCOUNTER — Ambulatory Visit (INDEPENDENT_AMBULATORY_CARE_PROVIDER_SITE_OTHER): Payer: Medicare Other | Admitting: Pharmacist Clinician (PhC)/ Clinical Pharmacy Specialist

## 2015-07-05 DIAGNOSIS — I4891 Unspecified atrial fibrillation: Secondary | ICD-10-CM | POA: Diagnosis not present

## 2015-07-05 DIAGNOSIS — I4819 Other persistent atrial fibrillation: Secondary | ICD-10-CM

## 2015-07-05 DIAGNOSIS — Z7901 Long term (current) use of anticoagulants: Secondary | ICD-10-CM | POA: Diagnosis not present

## 2015-07-05 DIAGNOSIS — I481 Persistent atrial fibrillation: Secondary | ICD-10-CM | POA: Diagnosis not present

## 2015-07-05 LAB — POCT INR: INR: 2.6

## 2015-07-08 ENCOUNTER — Telehealth: Payer: Self-pay | Admitting: Diagnostic Neuroimaging

## 2015-07-08 MED ORDER — DALFAMPRIDINE ER 10 MG PO TB12: 10.0000 mg | ORAL_TABLET | Freq: Two times a day (BID) | ORAL | Status: DC

## 2015-07-08 MED ORDER — DIMETHYL FUMARATE 240 MG PO CPDR: 1.0000 | DELAYED_RELEASE_CAPSULE | Freq: Two times a day (BID) | ORAL | Status: DC

## 2015-07-08 NOTE — Telephone Encounter (Signed)
New pharmacy has been added, and Rx's have been sent.  Receipt confirmed by pharmacy.

## 2015-07-08 NOTE — Telephone Encounter (Signed)
Patient called to request that prescriptions be sent to [facility]. Patient has [employer] and they are switching from [employer] to [employer].

## 2015-07-11 NOTE — Telephone Encounter (Signed)
I called back.  Spoke with Almyra Free.  Verified patient is established on this drug.  They will proceed with order as prescribed.

## 2015-07-11 NOTE — Telephone Encounter (Signed)
Liz/[facility] [phone] ext [phone] called to inquire if Tecfidera is a new Rx and patient needs Titration Pak or if patient has been on the medication for a while?

## 2015-07-20 ENCOUNTER — Other Ambulatory Visit: Payer: Self-pay | Admitting: Diagnostic Neuroimaging

## 2015-08-02 ENCOUNTER — Ambulatory Visit (INDEPENDENT_AMBULATORY_CARE_PROVIDER_SITE_OTHER): Payer: Medicare Other | Admitting: Pharmacist Clinician (PhC)/ Clinical Pharmacy Specialist

## 2015-08-02 DIAGNOSIS — I481 Persistent atrial fibrillation: Secondary | ICD-10-CM | POA: Diagnosis not present

## 2015-08-02 DIAGNOSIS — I4819 Other persistent atrial fibrillation: Secondary | ICD-10-CM

## 2015-08-02 DIAGNOSIS — Z7901 Long term (current) use of anticoagulants: Secondary | ICD-10-CM

## 2015-08-02 DIAGNOSIS — I4891 Unspecified atrial fibrillation: Secondary | ICD-10-CM | POA: Diagnosis not present

## 2015-08-02 LAB — POCT INR: INR: 2.6

## 2015-08-13 ENCOUNTER — Ambulatory Visit (INDEPENDENT_AMBULATORY_CARE_PROVIDER_SITE_OTHER): Payer: Medicare Other | Admitting: Diagnostic Neuroimaging

## 2015-08-13 ENCOUNTER — Encounter: Payer: Self-pay | Admitting: Diagnostic Neuroimaging

## 2015-08-13 VITALS — BP 150/74 | HR 78 | Resp 16 | Ht 68.0 in | Wt 167.8 lb

## 2015-08-13 DIAGNOSIS — G35 Multiple sclerosis: Secondary | ICD-10-CM

## 2015-08-13 DIAGNOSIS — G35D Multiple sclerosis, unspecified: Secondary | ICD-10-CM

## 2015-08-13 NOTE — Patient Instructions (Signed)
-  continue current medications

## 2015-08-13 NOTE — Progress Notes (Signed)
PATIENT: [name] DOB: January 09, 1946   REASON FOR VISIT: follow up for MS HISTORY FROM: patient and wife  Chief Complaint  Patient presents with  . Multiple Sclerosis    Sts. he continues to tolerate Tecfidera and Ampyra  (as long as he eats before taking Tecfidera).  Sts. walking, gen. weakness, and bladder are some worse./fim     HISTORY OF PRESENT ILLNESS:  UPDATE 08/13/15: Tolerating meds. More urinary issues. Using walker mainly.   UPDATE 04/16/15: Since last visit, patient is stable. Getting home visits with palliative care.   UPDATE 02/05/15: Discussed palliative care consult and options.   UPDATE 08/02/14: Since last visit, feels stable. Tolerating tecfidera and ampyra. No new events. Uses cane and walker.  Swimming more. Wife notes memory issues slightly worse.   UPDATE 01/31/14: Since last visit, continues on tecfidera. No progression of neuro sxs or MS sxs. Asking about driving again. Has not driven in last 3-4 months. Ongoing medical mgmt of his cardiac issues, and he is not a surgical candidate.  UPDATE 10/25/13 (LL): Patient comes in for revisit, since last visit he had worsening right-sided HF and was hospitalized.  He was seen by [name] for surgical consultation to consider tricuspid valve repair and possible Maze procedure.  He was referred to the advanced heart failure clinic who has been adjusting his lasix to manage his lower extremity edema and shortness of breath.  He is to be seen by [name] again for follow up in another month, and says [name] felt that surgery was needed.  He comes in today to discuss with [name] how his MS would be affected by having the surgery.  His wife feels like he is having more difficulty walking since last visit, but he has not had any falls.  He is tolerating Tecfidera and Ampyra well.  UPDATE 07/19/13 (VP): Patient continues to have progressive deterioration in cognitive ability, memory, mood lability, gait  stability. Patient is on tecfidera now, and tolerating without side effects. Patient feels when he came off of Betaseron he had some accelerated progression of MS that this has slowed down since starting tecfidera. Unclear whether patient's current level of functioning is on the same trajectory as the decline that was noted while patient was on Betaseron, or whether there has been some acceleration of deterioration. Other factors include some marital discord and comorbid depression. Other contributing factors include his cardiac issues including atrial fibrillation, on Coumadin, as well as severe tricuspid valve regurgitation leading to peripheral edema. Unfortunately patient is not felt to be a good surgical candidate.   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 The patient is accompanied by his wife. Pt. States he is doing worse since his last visit in Feb. He is walking slower and having more difficulty standing from a seated position. 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 [name] had talked to him about changing MS medications and he thinks he wants to. Having more memory problems. Continuing on betaseron right now.   PRIOR HPI (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 with a history of multiple sclerosis diagnosed in September 1996. He has been on Betaseron since 1996 and has right leg spasticity requiring him to use a cane in his left hand.He does not have any significant side effects from the Betaseron.  Blood studies 02/10/2012 are normal CBC and CMP except for a low platelet count of 63K, Hgb 12.1 and alk Phos of 253. This is being followed by [name] and [name]. He has never had lymphopenia or neutropenia. There is no change in his symptoms. He denies bowel or bladder incontinence, weakness, Lhermitte's sign, or  double vision. He swims 3 times per week for 10 laps which is a little over a quarter of a mile, claims he is winded afterwards. He has not fallen, uses a cane in his left hand. MRI of the brain and cervical spine with and without contrast enhancement 03/26/2010 showed multiple subcortical, periventricular, and brainstem white matter hyperintensities without enhancing lesions present. There were T1 black holes and atrophy present. The cervical MRI showed a large central disc protrusion at C5-6 and spinal cord hyperintensities at C4 and brain stem representing remote demyelinating plaques without enhancing lesions present. Ampyra was started November 2012 with improvement in gait. He was pleased with the addition of the drug and his response. Pt denies injection site problems however he says he is tired of shots. He has been on [employer] since 1996 and we have talked about whether changing to an oral agent should be considered. He had several falls with his right leg giving way on standing. He has right knee pain. He states his memory is worse. He denies numbness delusions or depression. 07/11/2012= (MMSE29/30. Clock drawing task4/4. Animal fluency test 17). His wife has noted more problems walking which the patient is hesitant to discuss. He does admit since calling me for a work in appointment , that he believes he is having more difficulty walking. He uses a walker at home and is noticeably slower. He uses the walker to get out of a bed that is low and out of a chair. He notices shortness of breath on swimming. His swallowing is okay except for occasional difficulty with liquids. He has right knee pain when walking. Betaseron seems to help his knee pain, walking, and his eyes after an injection. He is hesitant to go off of the medication for 3 weeks. He has increased spasms in his right foot and leg that are also moving to the left leg. He has swelling in his legs treated with furosemide.    REVIEW OF SYSTEMS:  Full 14 system review of systems performed and notable only as per HPI.   ALLERGIES: No Known Allergies  HOME MEDICATIONS: Outpatient Prescriptions Prior to Visit  Medication Sig Dispense Refill  . Cholecalciferol (VITAMIN D3) 2000 UNITS TABS Take 1 capsule by mouth daily.    Marland Kitchen dalfampridine (AMPYRA) 10 MG TB12 Take 1 tablet (10 mg total) by mouth 2 (two) times daily. 180 tablet 2  . Dimethyl Fumarate (TECFIDERA) 240 MG CPDR Take 1 capsule (240 mg total) by mouth 2 (two) times daily. 180 capsule 2  . ferrous sulfate 325 (65 FE) MG tablet Take 1 tablet (325 mg total) by mouth 3 (three) times daily with meals. 90 tablet 1  . folic acid (FOLVITE) 1 MG tablet Take 1 mg by mouth daily.      . furosemide (LASIX) 40 MG tablet Take 1 tablet (40 mg total) by mouth 2 (two) times daily. 180 tablet 3  . NON FORMULARY Take 2 tablets by mouth daily. OTC Laxative    . potassium chloride (K-DUR) 10 MEQ tablet TAKE (2) TABLETS DAILY. 60 tablet 3  . QUEtiapine (SEROQUEL) 25 MG tablet Take 25 mg by mouth at bedtime as  needed.     . senna-docusate (SENOKOT-S) 8.6-50 MG per tablet Take 2 tablets by mouth daily.    Marland Kitchen triamcinolone cream (KENALOG) 0.1 %     . warfarin (COUMADIN) 2 MG tablet TAKE 1 TO 1&1/2 TABLETS DAILY AS DIRECTED BY THE COUMADIN CLINIC. 120 tablet 1  . rosuvastatin (CRESTOR) 5 MG tablet Take 5 mg by mouth daily.      . AMPYRA 10 MG TB12 TAKE 1 TABLET TWICE A DAY (Patient not taking: Reported on 08/13/2015) 180 tablet 2  . potassium chloride (K-DUR) 10 MEQ tablet TAKE (2) TABLETS DAILY. (Patient not taking: Reported on 08/13/2015) 60 tablet 0  . potassium chloride (K-DUR) 10 MEQ tablet TAKE (2) TABLETS DAILY. (Patient not taking: Reported on 08/13/2015) 60 tablet 3   No facility-administered medications prior to visit.    PAST MEDICAL HISTORY: Past Medical History  Diagnosis Date  . Neuromuscular disorder ([place])   . Hypertension   . Anemia   . Depression   . Multiple sclerosis ([place])     . Thrombocytopenia ([place])   . Dyslipidemia   . Aortic dissection, thoracic ([place])   . BPH (benign prostatic hyperplasia)   . Hemorrhoids   . Aortic dissection ([place]) 11/05/1998    S/P emergency repair of acute type A aortic dissection with resuspension of native aortic valve  . Aneurysm of aortic arch ([place]) 04/04/2012    Chronic aneurysmal dilatation of aortic arch with chronic type A aortic dissection, s/p replacement of ascending thoracic aorta  . CHF (congestive heart failure) ([place]) June 27, 1946    2D Echo - EF 50-55%, mild-moderately dilated right ventricle, mild-moderate tricuspid valve regurgitation, moderately dilated right atrium  . Bilateral lower extremity edema   . Atrial fibrillation ([place]) 01/30/2013    Atrial fibrillation   . Pleural effusion, right, large 01/30/2013  . Severe tricuspid valve regurgitation 01/30/2013    Severe tricuspid regurg by recent 2-D echo with a dilated tricuspid annulus, moderate pulmonary hypertension and biatrial enlargement   . Mitral regurgitation 02/20/2013  . Exertional shortness of breath     "for awhile now" (09/11/2013)  . History of blood transfusion     PAST SURGICAL HISTORY: Past Surgical History  Procedure Laterality Date  . Hemorrhoid surgery  2009    Internal, external hemorrhoidectomy, general anesthesia,prone position. [Other]  . Open reduction and internal fixation of right distal radius fracture using hand innovations distal radius volar locking plate.  07/2005    Dr Ninfa Linden  . Tee without cardioversion N/A 02/17/2013    Procedure: TRANSESOPHAGEAL ECHOCARDIOGRAM (TEE);  Surgeon: [name], MD;  Location: [facility] ENDOSCOPY;  Service: Cardiovascular;  Laterality: N/A;  . Repair of acute type a aortic dissection with resuspension of native aortic valve  10/15/1998    Dr Roxy Manns    FAMILY HISTORY: Family History  Problem Relation Age of Onset  . Thyroid cancer Father     smoked  . Lung cancer Brother     smoked  . Heart attack Mother    . Emphysema Brother     smoked    SOCIAL HISTORY: Social History   Social History  . Marital Status: Married    Spouse Name: Jackelyn Poling  . Number of Children: 2  . Years of Education: Ph.D   Occupational History  . 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 Smoking status: Never Smoker   . Smokeless tobacco: Never Used  . Alcohol Use: Yes  Comment: 09/11/2013 "might have a drink once/month"  . Drug Use: No  . Sexual Activity: Not Currently   Other Topics Concern  . Not on file   Social History Narrative   Patient lives at home with spouse.   Caffeine Use: eat a lot of chocolate, sodas, coffee and tea occasionally     PHYSICAL EXAM  Filed Vitals:   08/13/15 1435  BP: 150/74  Pulse: 78  Resp: 16  Height: '5\' 8"'  (1.727 m)  Weight: 167 lb 12.8 oz (76.114 kg)   Body mass index is 25.52 kg/(m^2).  Generalized: Well developed, in no acute distress  Neck: Supple, no carotid bruits  Cardiac: REG RATE RHYTHM, SYS MURMUR  Neurological examination  MENTAL STATUS: awake, alert, language fluent, comprehension intact, naming intact  CRANIAL NERVE: pupils equal and reactive to light, visual fields full to confrontation, extraocular muscles: SACCADIC BREAKDOWN OF SMOOTH PURSUIT; END GAZE NYSTAGMUS; PAST POINTING ON SACCADES. Facial sensation and WITH DECR RIGHT EYE CLOSURE WEAKNESS AND RIGHT LOWER FACIAL STRENGTH. SYNKINESES ON RIGHT FACE. Uvula midline, shoulder shrug symmetric, tongue midline.  MOTOR: INCREASED TONE IN BLE > BUE (RIGHT > LEFT). BUE (DELTOID 4+, TRICEP 4, BICEP 5, GRIP 4+), RIGHT LEG (HF 1-2, KE 3, KF 2, DF 1-2), LEFT LEG (HF 3, KE/KF 3, DF 3) SENSORY: DECR IN RLE TO ALL MODALITIES.  COORDINATION: finger-nose-finger, fine finger movements normal  REFLEXES: BUE 3 (R>L), RLE 3, LLE 2.  GAIT/STATION: DIFF STANDING FROM CHAIR. USES A WALKER. SPASTIC GAIT. RIGHT CIRCUMDUCTION. UNSTEADY.    DIAGNOSTIC DATA (LABS, IMAGING,  TESTING) - I reviewed patient records, labs, notes, testing and imaging myself where available.  Lab Results  Component Value Date   WBC 5.0 02/08/2015   HGB 14.9 02/08/2015   HCT 44.4 02/08/2015   MCV 84.4 02/08/2015   PLT 124* 02/08/2015   CMP Latest Ref Rng 02/08/2015 01/01/2015 12/31/2014  Glucose 70 - 99 mg/dL 85 - -  BUN 6 - 23 mg/dL 26(H) - 25(H)  Creatinine 0.50 - 1.35 mg/dL 1.31 1.40(H) -  Sodium 135 - 145 mEq/L 141 - -  Potassium 3.5 - 5.3 mEq/L 4.3 - -  Chloride 96 - 112 mEq/L 101 - -  CO2 19 - 32 mEq/L 29 - -  Calcium 8.4 - 10.5 mg/dL 9.7 - -  Total Protein 6.0 - 8.3 g/dL 7.3 - -  Total Bilirubin 0.2 - 1.2 mg/dL 0.9 - -  Alkaline Phos 39 - 117 U/L 209(H) - -  AST 0 - 37 U/L 28 - -  ALT 0 - 53 U/L 38 - -   LYMPH#  Date Value Ref Range Status  02/10/2012 0.9 0.9 - 3.3 10e3/uL Final  08/10/2011 1.1 0.9 - 3.3 10e3/uL Final  03/12/2010 0.8* 0.9 - 3.3 10e3/uL Final   LYMPHOCYTES ABSOLUTE  Date Value Ref Range Status  08/02/2014 0.5* 0.7 - 3.1 x10E3/uL Final  01/31/2014 0.3* 0.7 - 3.1 x10E3/uL Final  07/19/2013 0.7 0.7 - 3.1 x10E3/uL Final   LYMPHS ABS  Date Value Ref Range Status  02/08/2015 0.5* 0.7 - 4.0 K/uL Final  02/10/2008 0.7  Final  01/12/2008 0.7  Final    11/16/12 MRI brain - There are multiple periventricular and subcortical and pontine chronic demyelinating plaques. There are several T1 black holes. Mild diffuse atrophy. No acute plaques. In comparison to MRI from 03/26/10, there is no significant change.   08/02/13 MRI brain - multiple brainstem, periventricular, corpus callosal and subcortical white matter hyperintensities compatible with chronic  lesions of multiple sclerosis. No enhancing lesions are noted. The presence of T1 black holes and significant atrophy of corpus callosum and cortex indicates chronic disease.  11/16/12 MRI cervical spine - Multiple chronic demyelinating plaques at C2-3, C3-4 and C5-6. No acute plaques. At C5-6: Disc bulging with  facet hypertrophy with moderate spinal stenosis, severe right and moderate left foraminal stenosis. At C3-4, C4-5: Disc bulging with mild spinal stenosis and biforaminal stenosis. In comparison to MRI from 03/26/10 there is no significant change.   01/20/13 JCV antibody - positive, index 3.37 (H)    ASSESSMENT AND PLAN  69 y.o. old male with Hypertension; Anemia; Depression; Multiple sclerosis; Thrombocytopenia; Dyslipidemia; Aortic dissection, thoracic; Aortic dissection (11/05/1998); Aneurysm of aortic arch (04/04/2012); Chest pain (11/18/2009); CHF (congestive heart failure), Atrial fibrillation (01/30/2013); Pleural effusion, right, large (01/30/2013); Severe tricuspid valve regurgitation (01/30/2013); Mitral regurgitation (02/20/2013); and Lower extremity edema (02/20/2013) here with multiple sclerosis since 1996. Initially on betaseron, now on tecfidera.   PLAN: 1. Continue Tecfidera and Ampyra. 2. Continue palliative care   Return in about 6 months (around 02/10/2016).     Penni Bombard, MD 09/06/17, 0:97 PM Certified in Neurology, Neurophysiology and Neuroimaging  [facility] [address] [place], [place] [address] [phone]

## 2015-08-15 ENCOUNTER — Other Ambulatory Visit: Payer: Self-pay | Admitting: Cardiovascular Disease

## 2015-08-30 ENCOUNTER — Ambulatory Visit (INDEPENDENT_AMBULATORY_CARE_PROVIDER_SITE_OTHER): Payer: Medicare Other | Admitting: Pharmacist Clinician (PhC)/ Clinical Pharmacy Specialist

## 2015-08-30 DIAGNOSIS — I4891 Unspecified atrial fibrillation: Secondary | ICD-10-CM | POA: Diagnosis not present

## 2015-08-30 DIAGNOSIS — I481 Persistent atrial fibrillation: Secondary | ICD-10-CM | POA: Diagnosis not present

## 2015-08-30 DIAGNOSIS — Z7901 Long term (current) use of anticoagulants: Secondary | ICD-10-CM | POA: Diagnosis not present

## 2015-08-30 DIAGNOSIS — I4819 Other persistent atrial fibrillation: Secondary | ICD-10-CM

## 2015-08-30 LAB — POCT INR: INR: 2.5

## 2015-09-21 ENCOUNTER — Telehealth: Payer: Self-pay

## 2015-09-21 NOTE — Telephone Encounter (Signed)
[facility] has approved the request for continuation of coverage on Ampyra effective until 10/06/2016, or until the policy changes or is terminated Ref PA# [place] [phone] HM  Ref ID: [id number]

## 2015-09-27 ENCOUNTER — Ambulatory Visit (INDEPENDENT_AMBULATORY_CARE_PROVIDER_SITE_OTHER): Payer: Medicare Other | Admitting: Pharmacist Clinician (PhC)/ Clinical Pharmacy Specialist

## 2015-09-27 DIAGNOSIS — I481 Persistent atrial fibrillation: Secondary | ICD-10-CM

## 2015-09-27 DIAGNOSIS — I4891 Unspecified atrial fibrillation: Secondary | ICD-10-CM | POA: Diagnosis not present

## 2015-09-27 DIAGNOSIS — Z7901 Long term (current) use of anticoagulants: Secondary | ICD-10-CM | POA: Diagnosis not present

## 2015-09-27 DIAGNOSIS — I4819 Other persistent atrial fibrillation: Secondary | ICD-10-CM

## 2015-09-27 LAB — POCT INR: INR: 2.8

## 2015-10-01 ENCOUNTER — Other Ambulatory Visit: Payer: Self-pay | Admitting: Internal Medicine

## 2015-10-08 ENCOUNTER — Encounter: Payer: Self-pay | Admitting: Pharmacist Clinician (PhC)/ Clinical Pharmacy Specialist

## 2015-10-08 ENCOUNTER — Encounter: Payer: Self-pay | Admitting: *Deleted

## 2015-10-30 ENCOUNTER — Other Ambulatory Visit: Payer: Self-pay | Admitting: Internal Medicine

## 2015-11-04 ENCOUNTER — Ambulatory Visit (INDEPENDENT_AMBULATORY_CARE_PROVIDER_SITE_OTHER): Payer: Medicare Other | Admitting: Pharmacist Clinician (PhC)/ Clinical Pharmacy Specialist

## 2015-11-04 DIAGNOSIS — Z7901 Long term (current) use of anticoagulants: Secondary | ICD-10-CM

## 2015-11-04 DIAGNOSIS — I481 Persistent atrial fibrillation: Secondary | ICD-10-CM

## 2015-11-04 DIAGNOSIS — I4891 Unspecified atrial fibrillation: Secondary | ICD-10-CM

## 2015-11-04 DIAGNOSIS — I4819 Other persistent atrial fibrillation: Secondary | ICD-10-CM

## 2015-11-04 LAB — POCT INR: INR: 1.9

## 2015-11-07 ENCOUNTER — Other Ambulatory Visit: Payer: Self-pay | Admitting: Cardiovascular Disease

## 2015-11-08 ENCOUNTER — Encounter: Payer: Medicare Other | Admitting: Pharmacist Clinician (PhC)/ Clinical Pharmacy Specialist

## 2015-11-11 ENCOUNTER — Encounter: Payer: Medicare Other | Admitting: Pharmacist Clinician (PhC)/ Clinical Pharmacy Specialist

## 2015-11-23 ENCOUNTER — Other Ambulatory Visit: Payer: Self-pay | Admitting: Internal Medicine

## 2015-12-16 ENCOUNTER — Ambulatory Visit (INDEPENDENT_AMBULATORY_CARE_PROVIDER_SITE_OTHER): Payer: Medicare Other | Admitting: Pharmacist Clinician (PhC)/ Clinical Pharmacy Specialist

## 2015-12-16 DIAGNOSIS — Z7901 Long term (current) use of anticoagulants: Secondary | ICD-10-CM | POA: Diagnosis not present

## 2015-12-16 DIAGNOSIS — I481 Persistent atrial fibrillation: Secondary | ICD-10-CM | POA: Diagnosis not present

## 2015-12-16 DIAGNOSIS — I4891 Unspecified atrial fibrillation: Secondary | ICD-10-CM

## 2015-12-16 DIAGNOSIS — I4819 Other persistent atrial fibrillation: Secondary | ICD-10-CM

## 2015-12-16 LAB — POCT INR: INR: 3.3

## 2015-12-24 ENCOUNTER — Other Ambulatory Visit: Payer: Self-pay | Admitting: Internal Medicine

## 2015-12-30 ENCOUNTER — Encounter: Payer: Self-pay | Admitting: Thoracic Surgery (Cardiothoracic Vascular Surgery)

## 2015-12-30 ENCOUNTER — Ambulatory Visit (INDEPENDENT_AMBULATORY_CARE_PROVIDER_SITE_OTHER): Payer: Medicare Other | Admitting: Thoracic Surgery (Cardiothoracic Vascular Surgery)

## 2015-12-30 ENCOUNTER — Other Ambulatory Visit: Payer: Self-pay | Admitting: Internal Medicine

## 2015-12-30 ENCOUNTER — Other Ambulatory Visit: Payer: Self-pay | Admitting: *Deleted

## 2015-12-30 VITALS — BP 159/89 | HR 89 | Resp 16 | Ht 68.0 in | Wt 161.8 lb

## 2015-12-30 DIAGNOSIS — I5032 Chronic diastolic (congestive) heart failure: Secondary | ICD-10-CM

## 2015-12-30 DIAGNOSIS — I071 Rheumatic tricuspid insufficiency: Secondary | ICD-10-CM

## 2015-12-30 DIAGNOSIS — I7122 Aneurysm of the aortic arch, without rupture: Secondary | ICD-10-CM

## 2015-12-30 DIAGNOSIS — I7100 Dissection of unspecified site of aorta: Secondary | ICD-10-CM

## 2015-12-30 DIAGNOSIS — I71011 Dissection of aortic arch: Secondary | ICD-10-CM

## 2015-12-30 DIAGNOSIS — I712 Thoracic aortic aneurysm, without rupture: Secondary | ICD-10-CM

## 2015-12-30 DIAGNOSIS — N289 Disorder of kidney and ureter, unspecified: Secondary | ICD-10-CM

## 2015-12-30 DIAGNOSIS — I7101 Dissection of thoracic aorta: Secondary | ICD-10-CM | POA: Diagnosis not present

## 2015-12-30 DIAGNOSIS — Z9889 Other specified postprocedural states: Secondary | ICD-10-CM | POA: Diagnosis not present

## 2015-12-30 DIAGNOSIS — I7103 Dissection of thoracoabdominal aorta: Secondary | ICD-10-CM

## 2015-12-30 NOTE — Patient Instructions (Signed)
Continue all previous medications without any changes at this time  Measure your blood pressure on a regular basis and report to Dr [name] for adjustment in your blood pressure medications

## 2015-12-30 NOTE — Progress Notes (Signed)
[address]       [place] [address]             [phone]     CARDIOTHORACIC SURGERY OFFICE NOTE  Referring Provider is [name], MD PCP is GREEN, [name], MD   HPI:  Patient is a 70 year old male with complex past medical history including severe multiple sclerosis who returns to the office today for routine follow-up of chronic type A aortic dissection status post repair in 2000 with progressive aneurysmal enlargement of the remaining dilated aortic root and chronically dissected transverse aortic arch.  The patient also has chronic diastolic and right sided congestive heart failure, tricuspid regurgitation, and atrial fibrillation. He was last seen in our office on 12/31/2014 and shortly after that he underwent CT angiogram that revealed slight enlargement of the aortic root but overall no significant change in comparison with previous scans.  He returns to our office today for follow-up but has not yet had a repeat scan performed.  Over the past year he has been followed closely by his neurologist, [name].  He has not been seen in follow-up in the heart failure clinic.  He states that he has been quite careful with sodium intake and he manages his diuretic dosage directly. He has not had problems with shortness of breath. Unfortunately, he has experienced continued slow gradual progression in his multiple sclerosis. He is quite weak and his mobility is very limited. However, he is able to ambulate slowly using a rolling walker. He has not had any pain in his chest or back that could be related to his underlying chronic aortic dissection. He states that Dr. Carlota Raspberry has intentionally backed off on medications to keep his blood pressure down in effort to provide better blood flow to the kidneys.   Current Outpatient Prescriptions  Medication Sig Dispense Refill  . Cholecalciferol (VITAMIN D3) 2000 UNITS TABS Take 1 capsule by mouth daily.    Marland Kitchen  dalfampridine (AMPYRA) 10 MG TB12 Take 1 tablet (10 mg total) by mouth 2 (two) times daily. 180 tablet 2  . Dimethyl Fumarate (TECFIDERA) 240 MG CPDR Take 1 capsule (240 mg total) by mouth 2 (two) times daily. 180 capsule 2  . ferrous sulfate 325 (65 FE) MG tablet Take 1 tablet (325 mg total) by mouth 3 (three) times daily with meals. 90 tablet 1  . folic acid (FOLVITE) 1 MG tablet Take 1 mg by mouth daily.      . furosemide (LASIX) 40 MG tablet TAKE 1 TABLET TWICE DAILY. 180 tablet 3  . NON FORMULARY Take 2 tablets by mouth daily. OTC Laxative    . potassium chloride (K-DUR) 10 MEQ tablet TAKE (2) TABLETS DAILY. 60 tablet 3  . QUEtiapine (SEROQUEL) 25 MG tablet Take 25 mg by mouth at bedtime as needed.     . senna-docusate (SENOKOT-S) 8.6-50 MG per tablet Take 2 tablets by mouth daily.    Marland Kitchen triamcinolone cream (KENALOG) 0.1 %     . warfarin (COUMADIN) 2 MG tablet TAKE 1 TO 1&1/2 TABLETS DAILY AS DIRECTED BY THE COUMADIN CLINIC. 120 tablet 1   No current facility-administered medications for this visit.      Physical Exam:   BP 159/89 mmHg  Pulse 89  Resp 16  Ht 5\' 8"  (1.727 m)  Wt 161 lb 12.8 oz (73.392 kg)  BMI 24.61 kg/m2  SpO2 98%  General:  Chronically debilitated and ill-appearing in jovial spirits  Chest:   Clear to auscultation  CV:   Regular rate and rhythm  Incisions:  n/a  Abdomen:  Soft nontender  Extremities:  Warm and well perfused, trace lower extremity edema  Diagnostic Tests:  n/a   Impression:  The patient remains stable from a cardiac standpoint on chronic diuretic therapy without any further episodes of acute exacerbations of chronic diastolic congestive heart failure and right-sided heart failure. His blood pressure remains somewhat elevated but stable. The patient's multiple sclerosis continues to slowly progress. The patient desires to proceed with follow-up CT angiography of the aorta for prognostic purposes.    Plan:  We will plan to recheck the  patient's renal function and proceed with CT angiography if his creatinine is stable. Long-term risks of allowing his blood pressure to run relatively high must be weighed against the risks of continued delayed aneurysmal enlargement of the patient's chronically dissected aorta.    I spent in excess of 30 minutes during the conduct of this office consultation and >50% of this time involved direct face-to-face encounter with the patient for counseling and/or coordination of their care.   Valentina Gu. Roxy Manns, MD 12/30/2015 5:31 PM

## 2016-01-07 ENCOUNTER — Ambulatory Visit
Admission: RE | Admit: 2016-01-07 | Discharge: 2016-01-07 | Disposition: A | Discharge status: Home / Self Care | Payer: Medicare Other | Source: Ambulatory Visit | Attending: Thoracic Surgery (Cardiothoracic Vascular Surgery) | Admitting: Thoracic Surgery (Cardiothoracic Vascular Surgery)

## 2016-01-07 DIAGNOSIS — I7100 Dissection of unspecified site of aorta: Secondary | ICD-10-CM

## 2016-01-07 MED ORDER — IOPAMIDOL (ISOVUE-370) INJECTION 76%
75.0000 mL | Freq: Once | INTRAVENOUS | Status: AC | PRN
  Administered 2016-01-07: 75 mL via INTRAVENOUS

## 2016-01-08 ENCOUNTER — Telehealth: Payer: Self-pay | Admitting: Thoracic Surgery (Cardiothoracic Vascular Surgery)

## 2016-01-08 DIAGNOSIS — I7122 Aneurysm of the aortic arch, without rupture: Secondary | ICD-10-CM

## 2016-01-08 DIAGNOSIS — I712 Thoracic aortic aneurysm, without rupture: Secondary | ICD-10-CM

## 2016-01-08 DIAGNOSIS — I7101 Dissection of thoracic aorta: Secondary | ICD-10-CM

## 2016-01-08 DIAGNOSIS — I71011 Dissection of aortic arch: Secondary | ICD-10-CM

## 2016-01-08 DIAGNOSIS — I7103 Dissection of thoracoabdominal aorta: Secondary | ICD-10-CM

## 2016-01-08 NOTE — Telephone Encounter (Signed)
Called and discussed results of patient's CT angiogram over the telephone.  Discussed the interval increase in size of his aortic root aneurysm and the aneurysm of the chronically dissected aortic arch.  We discussed concerns regarding the possibility of continued enlargement and/or acute rupture or death. The patient is not considered a candidate for surgical repair under either elective or emergent conditions because of his comorbid medical conditions, most notably his severe multiple sclerosis. The importance of close management of blood pressure control has been emphasized.  We have offered to refer the patient to [facility] for second opinion if desired.  Rexene Alberts, MD 01/08/2016 10:51 AM

## 2016-01-17 ENCOUNTER — Ambulatory Visit (INDEPENDENT_AMBULATORY_CARE_PROVIDER_SITE_OTHER): Payer: Medicare Other | Admitting: Pharmacist Clinician (PhC)/ Clinical Pharmacy Specialist

## 2016-01-17 DIAGNOSIS — I481 Persistent atrial fibrillation: Secondary | ICD-10-CM

## 2016-01-17 DIAGNOSIS — I4819 Other persistent atrial fibrillation: Secondary | ICD-10-CM

## 2016-01-17 DIAGNOSIS — I4891 Unspecified atrial fibrillation: Secondary | ICD-10-CM | POA: Diagnosis not present

## 2016-01-17 DIAGNOSIS — Z7901 Long term (current) use of anticoagulants: Secondary | ICD-10-CM

## 2016-01-17 LAB — POCT INR: INR: 1.6

## 2016-01-31 ENCOUNTER — Ambulatory Visit (INDEPENDENT_AMBULATORY_CARE_PROVIDER_SITE_OTHER): Payer: Medicare Other | Admitting: Pharmacist

## 2016-01-31 DIAGNOSIS — I481 Persistent atrial fibrillation: Secondary | ICD-10-CM | POA: Diagnosis not present

## 2016-01-31 DIAGNOSIS — I4891 Unspecified atrial fibrillation: Secondary | ICD-10-CM

## 2016-01-31 DIAGNOSIS — I4819 Other persistent atrial fibrillation: Secondary | ICD-10-CM

## 2016-01-31 DIAGNOSIS — Z7901 Long term (current) use of anticoagulants: Secondary | ICD-10-CM

## 2016-01-31 LAB — POCT INR: INR: 1.9

## 2016-02-11 ENCOUNTER — Encounter: Payer: Self-pay | Admitting: Diagnostic Neuroimaging

## 2016-02-11 ENCOUNTER — Ambulatory Visit (INDEPENDENT_AMBULATORY_CARE_PROVIDER_SITE_OTHER): Payer: Medicare Other | Admitting: Diagnostic Neuroimaging

## 2016-02-11 VITALS — BP 141/68 | HR 80 | Ht 68.0 in | Wt 164.8 lb

## 2016-02-11 DIAGNOSIS — R269 Unspecified abnormalities of gait and mobility: Secondary | ICD-10-CM

## 2016-02-11 DIAGNOSIS — D696 Thrombocytopenia, unspecified: Secondary | ICD-10-CM

## 2016-02-11 DIAGNOSIS — E559 Vitamin D deficiency, unspecified: Secondary | ICD-10-CM

## 2016-02-11 DIAGNOSIS — G35 Multiple sclerosis: Secondary | ICD-10-CM | POA: Diagnosis not present

## 2016-02-11 NOTE — Progress Notes (Signed)
PATIENT: [name] DOB: 1946/02/28   REASON FOR VISIT: follow up for MS HISTORY FROM: patient and wife  Chief Complaint  Patient presents with  . Multiple Sclerosis    rm 7, Tecfidera, 02/2015 MRI brain, "no changes; feel my MS is slowly preogressing, my R legs bends and it feels like my L leg is starting to bend"  . Follow-up    6 month     HISTORY OF PRESENT ILLNESS:  UPDATE 02/11/16: Since last visit, tolerating tecfidera. Some slow progression. Asking about new MS medications.  UPDATE 08/13/15: Tolerating meds. More urinary issues. Using walker mainly.   UPDATE 04/16/15: Since last visit, patient is stable. Getting home visits with palliative care.   UPDATE 02/05/15: Discussed palliative care consult and options.   UPDATE 08/02/14: Since last visit, feels stable. Tolerating tecfidera and ampyra. No new events. Uses cane and walker.  Swimming more. Wife notes memory issues slightly worse.   UPDATE 01/31/14: Since last visit, continues on tecfidera. No progression of neuro sxs or MS sxs. Asking about driving again. Has not driven in last 3-4 months. Ongoing medical mgmt of his cardiac issues, and he is not a surgical candidate.  UPDATE 10/25/13 (LL): Patient comes in for revisit, since last visit he had worsening right-sided HF and was hospitalized.  He was seen by [name] for surgical consultation to consider tricuspid valve repair and possible Maze procedure.  He was referred to the advanced heart failure clinic who has been adjusting his lasix to manage his lower extremity edema and shortness of breath.  He is to be seen by [name] again for follow up in another month, and says [name] felt that surgery was needed.  He comes in today to discuss with [name] how his MS would be affected by having the surgery.  His wife feels like he is having more difficulty walking since last visit, but he has not had any falls.  He is tolerating Tecfidera and Ampyra  well.  UPDATE 07/19/13 (VP): Patient continues to have progressive deterioration in cognitive ability, memory, mood lability, gait stability. Patient is on tecfidera now, and tolerating without side effects. Patient feels when he came off of Betaseron he had some accelerated progression of MS that this has slowed down since starting tecfidera. Unclear whether patient's current level of functioning is on the same trajectory as the decline that was noted while patient was on Betaseron, or whether there has been some acceleration of deterioration. Other factors include some marital discord and comorbid depression. Other contributing factors include his cardiac issues including atrial fibrillation, on Coumadin, as well as severe tricuspid valve regurgitation leading to peripheral edema. Unfortunately patient is not felt to be a good surgical candidate.   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 The patient is accompanied by his wife. Pt. States he is doing worse since his last visit in Feb. He is walking slower and having more difficulty standing from a seated position. 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 [name] had talked to him about changing MS medications and he thinks he wants to. Having more memory problems. Continuing on betaseron right now.   PRIOR HPI (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 with a history of multiple sclerosis diagnosed in September 1996. He has been on Betaseron since  1996 and has right leg spasticity requiring him to use a cane in his left hand.He does not have any significant side effects from the Betaseron. Blood studies 02/10/2012 are normal CBC and CMP except for a low platelet count of 63K, Hgb 12.1 and alk Phos of 253. This is being followed by [name] and [name]. He has never had  lymphopenia or neutropenia. There is no change in his symptoms. He denies bowel or bladder incontinence, weakness, Lhermitte's sign, or double vision. He swims 3 times per week for 10 laps which is a little over a quarter of a mile, claims he is winded afterwards. He has not fallen, uses a cane in his left hand. MRI of the brain and cervical spine with and without contrast enhancement 03/26/2010 showed multiple subcortical, periventricular, and brainstem white matter hyperintensities without enhancing lesions present. There were T1 black holes and atrophy present. The cervical MRI showed a large central disc protrusion at C5-6 and spinal cord hyperintensities at C4 and brain stem representing remote demyelinating plaques without enhancing lesions present. Ampyra was started November 2012 with improvement in gait. He was pleased with the addition of the drug and his response. Pt denies injection site problems however he says he is tired of shots. He has been on [employer] since 1996 and we have talked about whether changing to an oral agent should be considered. He had several falls with his right leg giving way on standing. He has right knee pain. He states his memory is worse. He denies numbness delusions or depression. 07/11/2012= (MMSE29/30. Clock drawing task4/4. Animal fluency test 17). His wife has noted more problems walking which the patient is hesitant to discuss. He does admit since calling me for a work in appointment , that he believes he is having more difficulty walking. He uses a walker at home and is noticeably slower. He uses the walker to get out of a bed that is low and out of a chair. He notices shortness of breath on swimming. His swallowing is okay except for occasional difficulty with liquids. He has right knee pain when walking. Betaseron seems to help his knee pain, walking, and his eyes after an injection. He is hesitant to go off of the medication for 3 weeks. He has increased spasms in his  right foot and leg that are also moving to the left leg. He has swelling in his legs treated with furosemide.    REVIEW OF SYSTEMS: Full 14 system review of systems performed and notable only as per HPI.   ALLERGIES: No Known Allergies  HOME MEDICATIONS: Outpatient Prescriptions Prior to Visit  Medication Sig Dispense Refill  . Cholecalciferol (VITAMIN D3) 2000 UNITS TABS Take 1 capsule by mouth daily.    Marland Kitchen dalfampridine (AMPYRA) 10 MG TB12 Take 1 tablet (10 mg total) by mouth 2 (two) times daily. 180 tablet 2  . Dimethyl Fumarate (TECFIDERA) 240 MG CPDR Take 1 capsule (240 mg total) by mouth 2 (two) times daily. 180 capsule 2  . ferrous sulfate 325 (65 FE) MG tablet Take 1 tablet (325 mg total) by mouth 3 (three) times daily with meals. 90 tablet 1  . folic acid (FOLVITE) 1 MG tablet Take 1 mg by mouth daily.      . furosemide (LASIX) 40 MG tablet TAKE 1 TABLET TWICE DAILY. 180 tablet 3  . NON FORMULARY Take 2 tablets by mouth daily. OTC Laxative    . potassium chloride (K-DUR) 10 MEQ tablet TAKE (2) TABLETS  DAILY. 60 tablet 3  . QUEtiapine (SEROQUEL) 25 MG tablet Take 25 mg by mouth at bedtime as needed.     . senna-docusate (SENOKOT-S) 8.6-50 MG per tablet Take 2 tablets by mouth daily.    Marland Kitchen triamcinolone cream (KENALOG) 0.1 %     . warfarin (COUMADIN) 2 MG tablet TAKE 1 TO 1&1/2 TABLETS DAILY AS DIRECTED BY THE COUMADIN CLINIC. 120 tablet 1  . potassium chloride (K-DUR) 10 MEQ tablet TAKE (2) TABLETS DAILY. 60 tablet 3   No facility-administered medications prior to visit.    PAST MEDICAL HISTORY: Past Medical History  Diagnosis Date  . Neuromuscular disorder ([place])   . Hypertension   . Anemia   . Depression   . Multiple sclerosis ([place])   . Thrombocytopenia ([place])   . Dyslipidemia   . Aortic dissection, thoracic ([place])   . BPH (benign prostatic hyperplasia)   . Hemorrhoids   . Aortic dissection ([place]) 11/05/1998    S/P emergency repair of acute type A aortic dissection with  resuspension of native aortic valve  . Aneurysm of aortic arch ([place]) 04/04/2012    Chronic aneurysmal dilatation of aortic arch with chronic type A aortic dissection, s/p replacement of ascending thoracic aorta  . CHF (congestive heart failure) ([place]) 02[birth year]28[birth year]47    2D Echo - EF 50-55%, mild-moderately dilated right ventricle, mild-moderate tricuspid valve regurgitation, moderately dilated right atrium  . Bilateral lower extremity edema   . Atrial fibrillation ([place]) 01/30/2013    Atrial fibrillation   . Pleural effusion, right, large 01/30/2013  . Severe tricuspid valve regurgitation 01/30/2013    Severe tricuspid regurg by recent 2-D echo with a dilated tricuspid annulus, moderate pulmonary hypertension and biatrial enlargement   . Mitral regurgitation 02/20/2013  . Exertional shortness of breath     "for awhile now" (09/11/2013)  . History of blood transfusion     PAST SURGICAL HISTORY: Past Surgical History  Procedure Laterality Date  . Hemorrhoid surgery  2009    Internal, external hemorrhoidectomy, general anesthesia,prone position. [Other]  . Open reduction and internal fixation of right distal radius fracture using hand innovations distal radius volar locking plate.  07/2005    Dr Ninfa Linden  . Tee without cardioversion N/A 02/17/2013    Procedure: TRANSESOPHAGEAL ECHOCARDIOGRAM (TEE);  Surgeon: [name], MD;  Location[facility] ENDOSCOPY;  Service: Cardiovascular;  Laterality: N/A;  . Repair of acute type a aortic dissection with resuspension of native aortic valve  10/15/1998    Dr Roxy Manns    FAMILY HISTORY: Family History  Problem Relation Age of Onset  . Thyroid cancer Father     smoked  . Lung cancer Brother     smoked  . Heart attack Mother   . Emphysema Brother     smoked    SOCIAL HISTORY: Social History   Social History  . Marital Status: Married    Spouse Name: Jackelyn Poling  . Number of Children: 2  . Years of Education: Ph.D   Occupational History  . Retired Professor  [employer]   Social History Main Topics  . Smoking status: Never Smoker   . Smokeless tobacco: Never Used  . Alcohol Use: Yes     Comment: 09/11/2013 "might have a drink once/month"  . Drug Use: No  . Sexual Activity: Not Currently   Other Topics Concern  . Not on file   Social History Narrative   Patient lives at home with spouse.   Caffeine Use:  eat a lot of chocolate, sodas, coffee and tea occasionally     PHYSICAL EXAM  Filed Vitals:   02/11/16 1435  BP: 141/68  Pulse: 80  Height: '5\' 8"'$  (1.727 m)  Weight: 164 lb 12.8 oz (74.753 kg)   Body mass index is 25.06 kg/(m^2).  Generalized: Well developed, in no acute distress  Neck: Supple, no carotid bruits  Cardiac: REG RATE RHYTHM, SYS MURMUR  Neurological examination  MENTAL STATUS: awake, alert, language fluent, comprehension intact, naming intact  CRANIAL NERVE: pupils equal and reactive to light, visual fields full to confrontation, extraocular muscles: SACCADIC BREAKDOWN OF SMOOTH PURSUIT; END GAZE NYSTAGMUS; PAST POINTING ON SACCADES. Facial sensation and WITH DECR RIGHT EYE CLOSURE WEAKNESS AND RIGHT LOWER FACIAL STRENGTH. SYNKINESES ON RIGHT FACE. Uvula midline, shoulder shrug symmetric, tongue midline.  MOTOR: INCREASED TONE IN BLE > BUE (RIGHT > LEFT). BUE (DELTOID 4+, TRICEP 4, BICEP 5, GRIP 4+), RIGHT LEG (HF 1-2, KE 3, KF 2, DF 1-2), LEFT LEG (HF 3, KE/KF 3, DF 3) SENSORY: DECR IN RLE TO ALL MODALITIES.  COORDINATION: finger-nose-finger, fine finger movements normal  REFLEXES: BUE 3 (R>L), RLE 3, LLE 2.  GAIT/STATION: DIFF STANDING FROM CHAIR. USES A WALKER. SPASTIC GAIT. RIGHT CIRCUMDUCTION. UNSTEADY.    DIAGNOSTIC DATA (LABS, IMAGING, TESTING) - I reviewed patient records, labs, notes, testing and imaging myself where available.  Lab Results  Component Value Date   WBC 5.0 02/08/2015   HGB 14.9 02/08/2015   HCT 44.4 02/08/2015   MCV 84.4 02/08/2015   PLT 124* 02/08/2015   CMP  Latest Ref Rng 02/08/2015 01/01/2015 12/31/2014  Glucose 70 - 99 mg/dL 85 - -  BUN 6 - 23 mg/dL 26(H) - 25(H)  Creatinine 0.50 - 1.35 mg/dL 1.31 1.40(H) -  Sodium 135 - 145 mEq/L 141 - -  Potassium 3.5 - 5.3 mEq/L 4.3 - -  Chloride 96 - 112 mEq/L 101 - -  CO2 19 - 32 mEq/L 29 - -  Calcium 8.4 - 10.5 mg/dL 9.7 - -  Total Protein 6.0 - 8.3 g/dL 7.3 - -  Total Bilirubin 0.2 - 1.2 mg/dL 0.9 - -  Alkaline Phos 39 - 117 U/L 209(H) - -  AST 0 - 37 U/L 28 - -  ALT 0 - 53 U/L 38 - -   LYMPH#  Date Value Ref Range Status  02/10/2012 0.9 0.9 - 3.3 10e3/uL Final  08/10/2011 1.1 0.9 - 3.3 10e3/uL Final  03/12/2010 0.8* 0.9 - 3.3 10e3/uL Final   LYMPHOCYTES ABSOLUTE  Date Value Ref Range Status  08/02/2014 0.5* 0.7 - 3.1 x10E3/uL Final  01/31/2014 0.3* 0.7 - 3.1 x10E3/uL Final  07/19/2013 0.7 0.7 - 3.1 x10E3/uL Final   LYMPHS ABS  Date Value Ref Range Status  02/08/2015 0.5* 0.7 - 4.0 K/uL Final  02/10/2008 0.7  Final  01/12/2008 0.7  Final    11/16/12 MRI brain - There are multiple periventricular and subcortical and pontine chronic demyelinating plaques. There are several T1 black holes. Mild diffuse atrophy. No acute plaques. In comparison to MRI from 03/26/10, there is no significant change.   08/02/13 MRI brain - multiple brainstem, periventricular, corpus callosal and subcortical white matter hyperintensities compatible with chronic lesions of multiple sclerosis. No enhancing lesions are noted. The presence of T1 black holes and significant atrophy of corpus callosum and cortex indicates chronic disease.  11/16/12 MRI cervical spine - Multiple chronic demyelinating plaques at C2-3, C3-4 and C5-6. No acute plaques. At C5-6: Disc bulging  with facet hypertrophy with moderate spinal stenosis, severe right and moderate left foraminal stenosis. At C3-4, C4-5: Disc bulging with mild spinal stenosis and biforaminal stenosis. In comparison to MRI from 03/26/10 there is no significant change.   01/20/13  JCV antibody - positive, index 3.37 (H)    ASSESSMENT AND PLAN  70 y.o. old male with Hypertension; Anemia; Depression; Multiple sclerosis; Thrombocytopenia; Dyslipidemia; Aortic dissection, thoracic; Aortic dissection (11/05/1998); Aneurysm of aortic arch (04/04/2012); Chest pain (11/18/2009); CHF (congestive heart failure), Atrial fibrillation (01/30/2013); Pleural effusion, right, large (01/30/2013); Severe tricuspid valve regurgitation (01/30/2013); Mitral regurgitation (02/20/2013); and Lower extremity edema (02/20/2013) here with multiple sclerosis since 1996. Initially on betaseron, now on tecfidera.   Dx:  MS (multiple sclerosis) ([place])  Thrombocytopenia ([place])  Vitamin D deficiency  Abnormality of gait     PLAN: 1. Continue Tecfidera and Ampyra 2. Continue palliative care  Orders Placed This Encounter  Procedures  . CBC with Differential/Platelet  . Comprehensive metabolic panel  . VITAMIN D 25 Hydroxy (Vit-D Deficiency, Fractures)   Return in about 6 months (around 08/13/2016).     Penni Bombard, MD 09/16/1592, 5:85 PM Certified in Neurology, Neurophysiology and Neuroimaging  [facility] [address], [place], [place] [address] [phone]

## 2016-02-12 LAB — CBC WITH DIFFERENTIAL/PLATELET
Basophils Absolute: 0 10*3/uL (ref 0.0–0.2)
Basos: 1 %
EOS (ABSOLUTE): 0.2 10*3/uL (ref 0.0–0.4)
Eos: 4 %
Hematocrit: 39.7 % (ref 37.5–51.0)
Hemoglobin: 13.2 g/dL (ref 12.6–17.7)
Immature Grans (Abs): 0 10*3/uL (ref 0.0–0.1)
Immature Granulocytes: 0 %
Lymphocytes Absolute: 0.5 10*3/uL — ABNORMAL LOW (ref 0.7–3.1)
Lymphs: 13 %
MCH: 27.6 pg (ref 26.6–33.0)
MCHC: 33.2 g/dL (ref 31.5–35.7)
MCV: 83 fL (ref 79–97)
Monocytes Absolute: 0.2 10*3/uL (ref 0.1–0.9)
Monocytes: 6 %
Neutrophils Absolute: 2.9 10*3/uL (ref 1.4–7.0)
Neutrophils: 76 %
Platelets: 127 10*3/uL — ABNORMAL LOW (ref 150–379)
RBC: 4.78 x10E6/uL (ref 4.14–5.80)
RDW: 15.6 % — ABNORMAL HIGH (ref 12.3–15.4)
WBC: 3.8 10*3/uL (ref 3.4–10.8)

## 2016-02-12 LAB — COMPREHENSIVE METABOLIC PANEL
ALT: 27 IU/L (ref 0–44)
AST: 24 IU/L (ref 0–40)
Albumin/Globulin Ratio: 1.4 (ref 1.2–2.2)
Albumin: 4.3 g/dL (ref 3.6–4.8)
Alkaline Phosphatase: 171 IU/L — ABNORMAL HIGH (ref 39–117)
BUN/Creatinine Ratio: 22 (ref 10–24)
BUN: 25 mg/dL (ref 8–27)
Bilirubin Total: 0.4 mg/dL (ref 0.0–1.2)
CO2: 26 mmol/L (ref 18–29)
Calcium: 9.3 mg/dL (ref 8.6–10.2)
Chloride: 105 mmol/L (ref 96–106)
Creatinine, Ser: 1.12 mg/dL (ref 0.76–1.27)
GFR calc Af Amer: 78 mL/min/{1.73_m2} (ref >59)
GFR calc non Af Amer: 67 mL/min/{1.73_m2} (ref >59)
Globulin, Total: 3.1 g/dL (ref 1.5–4.5)
Glucose: 74 mg/dL (ref 65–99)
Potassium: 4.2 mmol/L (ref 3.5–5.2)
Sodium: 148 mmol/L — ABNORMAL HIGH (ref 134–144)
Total Protein: 7.4 g/dL (ref 6.0–8.5)

## 2016-02-12 LAB — VITAMIN D 25 HYDROXY (VIT D DEFICIENCY, FRACTURES): Vit D, 25-Hydroxy: 36.3 ng/mL (ref 30.0–100.0)

## 2016-02-13 DIAGNOSIS — Z8679 Personal history of other diseases of the circulatory system: Secondary | ICD-10-CM | POA: Insufficient documentation

## 2016-02-14 ENCOUNTER — Ambulatory Visit (INDEPENDENT_AMBULATORY_CARE_PROVIDER_SITE_OTHER): Payer: Medicare Other | Admitting: Pharmacist Clinician (PhC)/ Clinical Pharmacy Specialist

## 2016-02-14 ENCOUNTER — Telehealth: Payer: Self-pay | Admitting: *Deleted

## 2016-02-14 DIAGNOSIS — Z7901 Long term (current) use of anticoagulants: Secondary | ICD-10-CM

## 2016-02-14 DIAGNOSIS — I481 Persistent atrial fibrillation: Secondary | ICD-10-CM

## 2016-02-14 DIAGNOSIS — I4891 Unspecified atrial fibrillation: Secondary | ICD-10-CM | POA: Diagnosis not present

## 2016-02-14 DIAGNOSIS — I4819 Other persistent atrial fibrillation: Secondary | ICD-10-CM

## 2016-02-14 LAB — POCT INR: INR: 1.9

## 2016-02-14 NOTE — Telephone Encounter (Signed)
LVM informing patient, per Dr [name], his lab results are unremarkable. Advised office is closed, reopens Mon at 8 am should he have questions. Left name, number.

## 2016-02-28 ENCOUNTER — Ambulatory Visit (INDEPENDENT_AMBULATORY_CARE_PROVIDER_SITE_OTHER): Payer: Medicare Other | Admitting: Pharmacist

## 2016-02-28 DIAGNOSIS — I4891 Unspecified atrial fibrillation: Secondary | ICD-10-CM | POA: Diagnosis not present

## 2016-02-28 DIAGNOSIS — I4819 Other persistent atrial fibrillation: Secondary | ICD-10-CM

## 2016-02-28 DIAGNOSIS — I481 Persistent atrial fibrillation: Secondary | ICD-10-CM

## 2016-02-28 DIAGNOSIS — Z7901 Long term (current) use of anticoagulants: Secondary | ICD-10-CM

## 2016-02-28 LAB — POCT INR: INR: 1.8

## 2016-03-13 ENCOUNTER — Ambulatory Visit (INDEPENDENT_AMBULATORY_CARE_PROVIDER_SITE_OTHER): Payer: Medicare Other | Admitting: Pharmacist

## 2016-03-13 DIAGNOSIS — I4891 Unspecified atrial fibrillation: Secondary | ICD-10-CM | POA: Diagnosis not present

## 2016-03-13 DIAGNOSIS — Z7901 Long term (current) use of anticoagulants: Secondary | ICD-10-CM

## 2016-03-13 DIAGNOSIS — I4819 Other persistent atrial fibrillation: Secondary | ICD-10-CM

## 2016-03-13 DIAGNOSIS — I481 Persistent atrial fibrillation: Secondary | ICD-10-CM | POA: Diagnosis not present

## 2016-03-13 LAB — POCT INR: INR: 1.9

## 2016-03-21 ENCOUNTER — Encounter: Payer: Self-pay | Admitting: Diagnostic Neuroimaging

## 2016-03-23 ENCOUNTER — Telehealth: Payer: Self-pay | Admitting: *Deleted

## 2016-03-23 NOTE — Telephone Encounter (Signed)
LVM requesting call back in response to my chart e mail. Patient asking about "cancer pill". Requested he call back to inform this RN what the name of the pill is he is referring. Left name, number.

## 2016-03-24 ENCOUNTER — Other Ambulatory Visit: Payer: Self-pay | Admitting: Diagnostic Neuroimaging

## 2016-03-25 ENCOUNTER — Telehealth: Payer: Self-pay | Admitting: Cardiovascular Disease

## 2016-03-25 NOTE — Telephone Encounter (Signed)
Called and left a message for the patient to call back and schedule his 12 month office visit with Dr. [name].

## 2016-03-27 ENCOUNTER — Ambulatory Visit (INDEPENDENT_AMBULATORY_CARE_PROVIDER_SITE_OTHER): Payer: Medicare Other | Admitting: Pharmacist Clinician (PhC)/ Clinical Pharmacy Specialist

## 2016-03-27 DIAGNOSIS — Z7901 Long term (current) use of anticoagulants: Secondary | ICD-10-CM

## 2016-03-27 DIAGNOSIS — I4819 Other persistent atrial fibrillation: Secondary | ICD-10-CM

## 2016-03-27 DIAGNOSIS — I4891 Unspecified atrial fibrillation: Secondary | ICD-10-CM

## 2016-03-27 DIAGNOSIS — I481 Persistent atrial fibrillation: Secondary | ICD-10-CM | POA: Diagnosis not present

## 2016-03-27 LAB — POCT INR: INR: 1.8

## 2016-04-03 ENCOUNTER — Encounter: Payer: Self-pay | Admitting: Diagnostic Neuroimaging

## 2016-04-07 ENCOUNTER — Telehealth: Payer: Self-pay | Admitting: *Deleted

## 2016-04-07 NOTE — Telephone Encounter (Signed)
Spoke with [place], [employer] re: PA for tecfidera. He took patient's information then stated PA approved for 24 months from today, [place] # [id number]. Noted on patient's MAR.  Called patient and informed him of above. He verbalized understanding, appreciation.

## 2016-04-08 ENCOUNTER — Other Ambulatory Visit: Payer: Self-pay | Admitting: Cardiovascular Disease

## 2016-04-10 ENCOUNTER — Ambulatory Visit (INDEPENDENT_AMBULATORY_CARE_PROVIDER_SITE_OTHER): Payer: Medicare Other | Admitting: Pharmacist Clinician (PhC)/ Clinical Pharmacy Specialist

## 2016-04-10 DIAGNOSIS — I481 Persistent atrial fibrillation: Secondary | ICD-10-CM

## 2016-04-10 DIAGNOSIS — I4819 Other persistent atrial fibrillation: Secondary | ICD-10-CM

## 2016-04-10 DIAGNOSIS — I4891 Unspecified atrial fibrillation: Secondary | ICD-10-CM | POA: Diagnosis not present

## 2016-04-10 DIAGNOSIS — Z7901 Long term (current) use of anticoagulants: Secondary | ICD-10-CM

## 2016-04-10 LAB — POCT INR: INR: 2.4

## 2016-04-26 ENCOUNTER — Other Ambulatory Visit: Payer: Self-pay | Admitting: Internal Medicine

## 2016-04-27 ENCOUNTER — Other Ambulatory Visit: Payer: Self-pay | Admitting: Diagnostic Neuroimaging

## 2016-05-05 ENCOUNTER — Ambulatory Visit (INDEPENDENT_AMBULATORY_CARE_PROVIDER_SITE_OTHER): Payer: Medicare Other | Admitting: Cardiovascular Disease

## 2016-05-05 ENCOUNTER — Ambulatory Visit (INDEPENDENT_AMBULATORY_CARE_PROVIDER_SITE_OTHER): Payer: Medicare Other | Admitting: Pharmacist Clinician (PhC)/ Clinical Pharmacy Specialist

## 2016-05-05 ENCOUNTER — Encounter: Payer: Self-pay | Admitting: Cardiovascular Disease

## 2016-05-05 VITALS — BP 150/70 | HR 74 | Ht 68.0 in | Wt 162.0 lb

## 2016-05-05 DIAGNOSIS — I7100 Dissection of unspecified site of aorta: Secondary | ICD-10-CM

## 2016-05-05 DIAGNOSIS — Z7901 Long term (current) use of anticoagulants: Secondary | ICD-10-CM

## 2016-05-05 DIAGNOSIS — I4891 Unspecified atrial fibrillation: Secondary | ICD-10-CM

## 2016-05-05 DIAGNOSIS — I071 Rheumatic tricuspid insufficiency: Secondary | ICD-10-CM | POA: Diagnosis not present

## 2016-05-05 DIAGNOSIS — I481 Persistent atrial fibrillation: Secondary | ICD-10-CM

## 2016-05-05 DIAGNOSIS — I4819 Other persistent atrial fibrillation: Secondary | ICD-10-CM

## 2016-05-05 DIAGNOSIS — I509 Heart failure, unspecified: Secondary | ICD-10-CM

## 2016-05-05 DIAGNOSIS — I50811 Acute right heart failure: Secondary | ICD-10-CM

## 2016-05-05 LAB — POCT INR: INR: 1.7

## 2016-05-05 NOTE — Assessment & Plan Note (Signed)
History of severe tricuspid regurgitation by 2-D echo with right-sided heart failure in the past chest remained stable.

## 2016-05-05 NOTE — Patient Instructions (Signed)

## 2016-05-05 NOTE — Progress Notes (Signed)
05/05/2016 JEYCOB CHARETTE   1946/08/03  [id number]  Primary Physician GREEN, [name], MD Primary Cardiologist: [name] MD Garret Reddish, [place], [place]  HPI:   [name] is a [phone].o. male history of hemispheric repair of type A aortic dissection with resuspension of the native aortic valve in February 2000 Dr. Ricard Dillon, chronic aortic dissection extends from the descending, multiple sclerosis  visualized thoracic aorta into the right common iliac artery, dyslipidemia, hypertension, anemia. Patient's last 2-D echocardiogram was 01/17/2013 revealed an ejection fraction of 50-55%.. Wall motion was normal. Uric valve surgery with a mild central regurgitation. Aortic root was dilated ST junction. Mild to moderate mitral valve regurgitation. Left H. and was severely dilated 35.5 cm. The right ventricle cavity size mildly dilated. Right atrium was severely dilated at 32.5 cm tricuspid valve showed severe regurgitation with reversal of flow in the hepatic veins. Dilated tricuspid annulus measuring 6.16 cm. The pulmonary pressure was 44 mmHg. Patient's last nuclear stress test was in 2011 and showed no significant ischemia was considered low risk. CT of the abdomen and pelvis 4/29.14 showed chronic aortic dissection that extends into the right common iliac artery. Small amount of ascites. Large right pleural effusion. Diffuse subcutaneous edema. These findings may be on the basis of elevated right heart filling pressures supported by cardiomegaly. The patient presents for evaluation. He reports the first signs signs of dyspnea on exertion emotion or edema approximately 2 years ago but recently has gotten severely more traumatic. Reports increased lower extremity edema and dyspnea on exertion. He usually swims but as of one month ago he had to stop swimming because of increased shortness of breath and fatigue. He denies orthopnea, PND, chest pain, nausea, vomiting, fever, palpitations. He does  report dizziness when stooping over on occasion. EKG done in our office shows atrial fibrillation with controlled ventricular rate, 62 beats per minute. [name] has adjusted his diuretics which has resulted in moderate improvement in his lower gemmae edema. He remains in atrial fibrillation with a controlled ventricular response. I believe his dyspnea and edema are related to a combination of his severe TR along with his A. Fib. He is a candidate for Coumadin anticoagulation for stroke prophylaxis which has since been started and followed here in our office. He saw [name] for consideration of tricuspid valve annuloplasty but thought to be too high risk for surgical intervention. He is on low-dose diuretic with minimal peripheral edema. He does have multiple sclerosis and is minimally ambulatory and spent most of his day in the chair although he does swim every other day. He was admitted with right heart failure decompensation in the late December of last year and was diuresed. Again he was seen by [name] and [name] e in consultation with Dr. Mali Hughs  at [facility] all felt that surgical correction of this because of regurgitation was high risk and therefore medical therapy was recommended. Since I saw him in the office last 01/04/14 his remains stable from a heart point of view. His multiple sclerosis has somewhat progressed. [facility] He denies chest pain or shortness of breath.   Current Outpatient Prescriptions  Medication Sig Dispense Refill  . AMPYRA 10 MG TB12 TAKE 1 TABLET BY MOUTH TWICE A DAY 60 tablet 3  . Cholecalciferol (VITAMIN D3) 2000 UNITS TABS Take 1 capsule by mouth daily.    . ferrous sulfate 325 (65 FE) MG tablet Take  1 tablet (325 mg total) by mouth 3 (three) times daily with meals. 90 tablet 1  . folic acid (FOLVITE) 1 MG tablet Take 1 mg by mouth daily.      . furosemide (LASIX) 40 MG tablet TAKE 1 TABLET TWICE DAILY. 180 tablet 3  .  losartan (COZAAR) 50 MG tablet 50 mg daily.    . NON FORMULARY Take 2 tablets by mouth daily. OTC Laxative    . potassium chloride (K-DUR) 10 MEQ tablet TAKE (2) TABLETS DAILY. 60 tablet 3  . potassium chloride (K-DUR) 10 MEQ tablet TAKE (2) TABLETS DAILY. 60 tablet 3  . QUEtiapine (SEROQUEL) 25 MG tablet Take 25 mg by mouth at bedtime as needed.     . TECFIDERA 240 MG CPDR TAKE 1 CAPSULE BY MOUTH TWICE A DAY 180 capsule 1  . triamcinolone cream (KENALOG) 0.1 %     . warfarin (COUMADIN) 2 MG tablet TAKE 1 TO 1&1/2 TABLETS DAILY AS DIRECTED BY THE COUMADIN CLINIC. 120 tablet 0   No current facility-administered medications for this visit.     No Known Allergies  Social History   Social History  . Marital status: Married    Spouse name: Jackelyn Poling  . Number of children: 2  . Years of education: Ph.D   Occupational History  . 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 Smoking status: Never Smoker  . Smokeless tobacco: Never Used  . Alcohol use Yes     Comment: 09/11/2013 "might have a drink once/month"  . Drug use: No  . Sexual activity: Not Currently   Other Topics Concern  . Not on file   Social History Narrative   Patient lives at home with spouse.   Caffeine Use: eat a lot of chocolate, sodas, coffee and tea occasionally     Review of Systems: General: negative for chills, fever, night sweats or weight changes.  Cardiovascular: negative for chest pain, dyspnea on exertion, edema, orthopnea, palpitations, paroxysmal nocturnal dyspnea or shortness of breath Dermatological: negative for rash Respiratory: negative for cough or wheezing Urologic: negative for hematuria Abdominal: negative for nausea, vomiting, diarrhea, bright red blood per rectum, melena, or hematemesis Neurologic: negative for visual changes, syncope, or dizziness All other systems reviewed and are otherwise negative except as noted above.    Blood pressure (!)  150/70, pulse 74, height 5\' 8"  (1.727 m), weight 162 lb (73.5 kg).  General appearance: alert and no distress Neck: no adenopathy, no carotid bruit, no JVD, supple, symmetrical, trachea midline and thyroid not enlarged, symmetric, no tenderness/mass/nodules Lungs: clear to auscultation bilaterally Heart: Soft outflow tract murmur Extremities: extremities normal, atraumatic, no cyanosis or edema  EKG atrial fibrillation with ventricular response of 74 and a complete right bundle branch block with nonspecific ST-T changes and septal Q waves. I personally reviewed this EKG  ASSESSMENT AND PLAN:   Aortic dissection- chronic abd dissection  Mr. Bedner has a remote chronic type B aortic dissection extending to the descending aorta into the right common iliac artery. This has been followed by [name] on annual basis.  Persistent atrial fibrillation History of chronic A. fib rate controlled on Coumadin anticoagulation  Severe tricuspid valve regurgitation History of severe tricuspid regurgitation by 2-D echo with right-sided heart failure in the past chest remained stable.  Acute right-sided congestive heart failure History of right-sided heart failure in the past with severe tricuspid regurgitation currently controlled on oral diuretics.  [name] MD FACP,FACC,FAHA, [facility] 05/05/2016 3:35 PM

## 2016-05-05 NOTE — Assessment & Plan Note (Signed)
Mr. Limbach has a remote chronic type B aortic dissection extending to the descending aorta into the right common iliac artery. This has been followed by [name] on annual basis.

## 2016-05-05 NOTE — Assessment & Plan Note (Signed)
History of chronic A. fib rate controlled on Coumadin anticoagulation. 

## 2016-05-05 NOTE — Assessment & Plan Note (Signed)
History of right-sided heart failure in the past with severe tricuspid regurgitation currently controlled on oral diuretics.

## 2016-05-22 ENCOUNTER — Ambulatory Visit (INDEPENDENT_AMBULATORY_CARE_PROVIDER_SITE_OTHER): Payer: Medicare Other | Admitting: Pharmacist Clinician (PhC)/ Clinical Pharmacy Specialist

## 2016-05-22 DIAGNOSIS — I481 Persistent atrial fibrillation: Secondary | ICD-10-CM | POA: Diagnosis not present

## 2016-05-22 DIAGNOSIS — I4819 Other persistent atrial fibrillation: Secondary | ICD-10-CM

## 2016-05-22 DIAGNOSIS — Z7901 Long term (current) use of anticoagulants: Secondary | ICD-10-CM

## 2016-05-22 DIAGNOSIS — I4891 Unspecified atrial fibrillation: Secondary | ICD-10-CM

## 2016-05-22 LAB — POCT INR: INR: 2.7

## 2016-06-15 ENCOUNTER — Encounter: Payer: Self-pay | Admitting: Diagnostic Neuroimaging

## 2016-06-16 ENCOUNTER — Encounter: Payer: Self-pay | Admitting: Diagnostic Neuroimaging

## 2016-06-16 ENCOUNTER — Telehealth: Payer: Self-pay | Admitting: Diagnostic Neuroimaging

## 2016-06-16 NOTE — Telephone Encounter (Signed)
Pt called to advise "aubagio looks promising.I would like to be considered for it. How do we proceed from here". Pt also complained that his email has not been answered.

## 2016-06-16 NOTE — Telephone Encounter (Signed)
I called patient. We discussed tecfidera vs aubagio. After a long discussion, we decided to continue tecfidera for now. MRI stable from 2014 to 2016. Patient agrees with plan. He will change his follow up appt to Nov/Dec 2017.   Penni Bombard, MD 97/02/7340, 9:37 PM Certified in Neurology, Neurophysiology and Neuroimaging  [facility] [address] [place], [place] [address] [phone]

## 2016-06-16 NOTE — Telephone Encounter (Signed)
Per Dr Stark Klein called patient and informed him that Dr [name] does not recommend he change from Tecfidera to [facility]. Advised the medication change will not improve his situation.  Patient stated that Tecfidera does not stop the progression of MS, but Aubagio does. He stated he received a flyer in the mail with the information.  Patient requested to come in and discuss changing medications. Scheduled FU at his request for 06/23/16, 8 am. Advised him he needs to arrive 10-15 early to check in. Patient stated "I'll try."  Apologized for delay in responding and notified patient that office policy states phone calls and messages must be answered in 24-48 hours. He verbalized understanding.

## 2016-06-17 ENCOUNTER — Other Ambulatory Visit: Payer: Self-pay | Admitting: Cardiovascular Disease

## 2016-06-19 ENCOUNTER — Ambulatory Visit (INDEPENDENT_AMBULATORY_CARE_PROVIDER_SITE_OTHER): Payer: Medicare Other | Admitting: Pharmacist

## 2016-06-19 DIAGNOSIS — I4891 Unspecified atrial fibrillation: Secondary | ICD-10-CM

## 2016-06-19 DIAGNOSIS — I4819 Other persistent atrial fibrillation: Secondary | ICD-10-CM

## 2016-06-19 DIAGNOSIS — I481 Persistent atrial fibrillation: Secondary | ICD-10-CM

## 2016-06-19 DIAGNOSIS — Z7901 Long term (current) use of anticoagulants: Secondary | ICD-10-CM

## 2016-06-19 LAB — POCT INR: INR: 2.8

## 2016-06-23 ENCOUNTER — Ambulatory Visit: Payer: Self-pay | Admitting: Diagnostic Neuroimaging

## 2016-07-17 ENCOUNTER — Ambulatory Visit (INDEPENDENT_AMBULATORY_CARE_PROVIDER_SITE_OTHER): Payer: Medicare Other | Admitting: Pharmacist

## 2016-07-17 DIAGNOSIS — I481 Persistent atrial fibrillation: Secondary | ICD-10-CM | POA: Diagnosis not present

## 2016-07-17 DIAGNOSIS — Z7901 Long term (current) use of anticoagulants: Secondary | ICD-10-CM

## 2016-07-17 DIAGNOSIS — I4891 Unspecified atrial fibrillation: Secondary | ICD-10-CM | POA: Diagnosis not present

## 2016-07-17 DIAGNOSIS — I4819 Other persistent atrial fibrillation: Secondary | ICD-10-CM

## 2016-07-17 LAB — POCT INR: INR: 2.1

## 2016-07-20 ENCOUNTER — Telehealth: Payer: Self-pay | Admitting: Pharmacist Clinician (PhC)/ Clinical Pharmacy Specialist

## 2016-07-20 NOTE — Telephone Encounter (Signed)
Spoke with patient and informed him that in the future he must be to the office and checked in by 5:00 pm or we will be unable to see him.  Suggested that he perhaps schedule his appointments before he goes to swim therapy or on a different day.  Also asked that if he is running late, he can just call and re-schedule for a different day that week.    Patient voiced understanding.

## 2016-07-29 ENCOUNTER — Other Ambulatory Visit: Payer: Self-pay | Admitting: Internal Medicine

## 2016-08-17 ENCOUNTER — Ambulatory Visit (INDEPENDENT_AMBULATORY_CARE_PROVIDER_SITE_OTHER): Payer: Medicare Other | Admitting: Pharmacist Clinician (PhC)/ Clinical Pharmacy Specialist

## 2016-08-17 DIAGNOSIS — I4891 Unspecified atrial fibrillation: Secondary | ICD-10-CM

## 2016-08-17 DIAGNOSIS — Z7901 Long term (current) use of anticoagulants: Secondary | ICD-10-CM

## 2016-08-17 DIAGNOSIS — I4819 Other persistent atrial fibrillation: Secondary | ICD-10-CM

## 2016-08-17 DIAGNOSIS — I481 Persistent atrial fibrillation: Secondary | ICD-10-CM | POA: Diagnosis not present

## 2016-08-17 LAB — POCT INR: INR: 2.1

## 2016-08-18 ENCOUNTER — Ambulatory Visit (INDEPENDENT_AMBULATORY_CARE_PROVIDER_SITE_OTHER): Payer: Medicare Other | Admitting: Diagnostic Neuroimaging

## 2016-08-18 ENCOUNTER — Encounter: Payer: Self-pay | Admitting: Diagnostic Neuroimaging

## 2016-08-18 VITALS — BP 138/76 | HR 96 | Wt 164.2 lb

## 2016-08-18 DIAGNOSIS — G35 Multiple sclerosis: Secondary | ICD-10-CM

## 2016-08-18 DIAGNOSIS — R269 Unspecified abnormalities of gait and mobility: Secondary | ICD-10-CM | POA: Diagnosis not present

## 2016-08-18 DIAGNOSIS — D696 Thrombocytopenia, unspecified: Secondary | ICD-10-CM

## 2016-08-18 DIAGNOSIS — E559 Vitamin D deficiency, unspecified: Secondary | ICD-10-CM

## 2016-08-18 NOTE — Progress Notes (Signed)
PATIENT: [name] DOB: 14-Feb-1946   REASON FOR VISIT: follow up for MS HISTORY FROM: patient and wife  Chief Complaint  Patient presents with  . Multiple Sclerosis    rm 7, wife- [name], "it progresses a little bit year"   . Follow-up    6 month     HISTORY OF PRESENT ILLNESS:  UPDATE 08/18/16: Since last visit, tolerating meds. No new issues. Some slight subjective progression of symptoms. Had a fall recently with subsequent right forearm bruising.   UPDATE 02/11/16: Since last visit, tolerating tecfidera. Some slow progression. Asking about new MS medications.  UPDATE 08/13/15: Tolerating meds. More urinary issues. Using walker mainly.   UPDATE 04/16/15: Since last visit, patient is stable. Getting home visits with palliative care.   UPDATE 02/05/15: Discussed palliative care consult and options.   UPDATE 08/02/14: Since last visit, feels stable. Tolerating tecfidera and ampyra. No new events. Uses cane and walker.  Swimming more. Wife notes memory issues slightly worse.   UPDATE 01/31/14: Since last visit, continues on tecfidera. No progression of neuro sxs or MS sxs. Asking about driving again. Has not driven in last 3-4 months. Ongoing medical mgmt of his cardiac issues, and he is not a surgical candidate.  UPDATE 10/25/13 (LL): Patient comes in for revisit, since last visit he had worsening right-sided HF and was hospitalized.  He was seen by [name] for surgical consultation to consider tricuspid valve repair and possible Maze procedure.  He was referred to the advanced heart failure clinic who has been adjusting his lasix to manage his lower extremity edema and shortness of breath.  He is to be seen by [name] again for follow up in another month, and says [name] felt that surgery was needed.  He comes in today to discuss with [name] how his MS would be affected by having the surgery.  His wife feels like he is having more difficulty walking  since last visit, but he has not had any falls.  He is tolerating Tecfidera and Ampyra well.  UPDATE 07/19/13 (VP): Patient continues to have progressive deterioration in cognitive ability, memory, mood lability, gait stability. Patient is on tecfidera now, and tolerating without side effects. Patient feels when he came off of Betaseron he had some accelerated progression of MS that this has slowed down since starting tecfidera. Unclear whether patient's current level of functioning is on the same trajectory as the decline that was noted while patient was on Betaseron, or whether there has been some acceleration of deterioration. Other factors include some marital discord and comorbid depression. Other contributing factors include his cardiac issues including atrial fibrillation, on Coumadin, as well as severe tricuspid valve regurgitation leading to peripheral edema. Unfortunately patient is not felt to be a good surgical candidate.   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 The patient is accompanied by his wife. Pt. States he is doing worse since his last visit in Feb. He is walking slower and having more difficulty standing from a seated position. 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 [name] had talked to him about changing MS medications and he thinks he wants to. Having more memory problems. Continuing on betaseron right now.   PRIOR HPI (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 with a history of multiple sclerosis  diagnosed in September 1996. He has been on Betaseron since 1996 and has right leg spasticity requiring him to use a cane in his left hand.He does not have any significant side effects from the Betaseron. Blood studies 02/10/2012 are normal CBC and CMP except for a low platelet count of 63K, Hgb 12.1 and alk  Phos of 253. This is being followed by [name] and [name]. He has never had lymphopenia or neutropenia. There is no change in his symptoms. He denies bowel or bladder incontinence, weakness, Lhermitte's sign, or double vision. He swims 3 times per week for 10 laps which is a little over a quarter of a mile, claims he is winded afterwards. He has not fallen, uses a cane in his left hand. MRI of the brain and cervical spine with and without contrast enhancement 03/26/2010 showed multiple subcortical, periventricular, and brainstem white matter hyperintensities without enhancing lesions present. There were T1 black holes and atrophy present. The cervical MRI showed a large central disc protrusion at C5-6 and spinal cord hyperintensities at C4 and brain stem representing remote demyelinating plaques without enhancing lesions present. Ampyra was started November 2012 with improvement in gait. He was pleased with the addition of the drug and his response. Pt denies injection site problems however he says he is tired of shots. He has been on [employer] since 1996 and we have talked about whether changing to an oral agent should be considered. He had several falls with his right leg giving way on standing. He has right knee pain. He states his memory is worse. He denies numbness delusions or depression. 07/11/2012= (MMSE29/30. Clock drawing task4/4. Animal fluency test 17). His wife has noted more problems walking which the patient is hesitant to discuss. He does admit since calling me for a work in appointment , that he believes he is having more difficulty walking. He uses a walker at home and is noticeably slower. He uses the walker to get out of a bed that is low and out of a chair. He notices shortness of breath on swimming. His swallowing is okay except for occasional difficulty with liquids. He has right knee pain when walking. Betaseron seems to help his knee pain, walking, and his eyes after an injection. He  is hesitant to go off of the medication for 3 weeks. He has increased spasms in his right foot and leg that are also moving to the left leg. He has swelling in his legs treated with furosemide.    REVIEW OF SYSTEMS: Full 14 system review of systems performed and notable only as per HPI.   ALLERGIES: No Known Allergies  HOME MEDICATIONS: Outpatient Medications Prior to Visit  Medication Sig Dispense Refill  . AMPYRA 10 MG TB12 TAKE 1 TABLET BY MOUTH TWICE A DAY 60 tablet 3  . Cholecalciferol (VITAMIN D3) 2000 UNITS TABS Take 1 capsule by mouth daily.    . ferrous sulfate 325 (65 FE) MG tablet Take 1 tablet (325 mg total) by mouth 3 (three) times daily with meals. 90 tablet 1  . folic acid (FOLVITE) 1 MG tablet Take 1 mg by mouth daily.      . furosemide (LASIX) 40 MG tablet TAKE 1 TABLET TWICE DAILY. 180 tablet 3  . losartan (COZAAR) 50 MG tablet 50 mg daily.    . NON FORMULARY Take 2 tablets by mouth daily. OTC Laxative    . potassium chloride (K-DUR) 10 MEQ tablet TAKE (2) TABLETS DAILY. 60 tablet 3  .  QUEtiapine (SEROQUEL) 25 MG tablet Take 25 mg by mouth at bedtime as needed.     . TECFIDERA 240 MG CPDR TAKE 1 CAPSULE BY MOUTH TWICE A DAY 180 capsule 1  . triamcinolone cream (KENALOG) 0.1 %     . warfarin (COUMADIN) 2 MG tablet TAKE 1 TO 1&1/2 TABLETS DAILY AS DIRECTED BY THE COUMADIN CLINIC. 120 tablet 1  . potassium chloride (K-DUR) 10 MEQ tablet TAKE (2) TABLETS DAILY. 60 tablet 3  . potassium chloride (K-DUR) 10 MEQ tablet TAKE (2) TABLETS DAILY. 60 tablet 3   No facility-administered medications prior to visit.     PAST MEDICAL HISTORY: Past Medical History:  Diagnosis Date  . Anemia   . Aneurysm of aortic arch ([place]) 04/04/2012   Chronic aneurysmal dilatation of aortic arch with chronic type A aortic dissection, s/p replacement of ascending thoracic aorta  . Aortic dissection ([place]) 11/05/1998   S/P emergency repair of acute type A aortic dissection with resuspension of  native aortic valve  . Aortic dissection, thoracic ([place])   . Atrial fibrillation ([place]) 01/30/2013   Atrial fibrillation   . Bilateral lower extremity edema   . BPH (benign prostatic hyperplasia)   . CHF (congestive heart failure) ([place]) May 17, 1946   2D Echo - EF 50-55%, mild-moderately dilated right ventricle, mild-moderate tricuspid valve regurgitation, moderately dilated right atrium  . Depression   . Dyslipidemia   . Exertional shortness of breath    "for awhile now" (09/11/2013)  . Hemorrhoids   . History of blood transfusion   . Hypertension   . Mitral regurgitation 02/20/2013  . Multiple sclerosis ([place])   . Neuromuscular disorder ([place])   . Pleural effusion, right, large 01/30/2013  . Severe tricuspid valve regurgitation 01/30/2013   Severe tricuspid regurg by recent 2-D echo with a dilated tricuspid annulus, moderate pulmonary hypertension and biatrial enlargement   . Thrombocytopenia ([place])     PAST SURGICAL HISTORY: Past Surgical History:  Procedure Laterality Date  . HEMORRHOID SURGERY  2009   Internal, external hemorrhoidectomy, general anesthesia,prone position. [Other]  . Open reduction and internal fixation of right distal radius fracture using [employer] distal radius volar locking plate.  07/2005   Dr Ninfa Linden  . Repair of acute type A aortic dissection with resuspension of native aortic valve  10/15/1998   Dr Roxy Manns  . TEE WITHOUT CARDIOVERSION N/A 02/17/2013   Procedure: TRANSESOPHAGEAL ECHOCARDIOGRAM (TEE);  Surgeon: [name], MD;  Location[facility]COPY;  Service: Cardiovascular;  Laterality: N/A;    FAMILY HISTORY: Family History  Problem Relation Age of Onset  . Thyroid cancer Father     smoked  . Heart attack Mother   . Lung cancer Brother     smoked  . Emphysema Brother     smoked    SOCIAL HISTORY: Social History   Social History  . Marital status: Married    Spouse name: Jackelyn Poling  . Number of children: 2  . Years of education: Ph.D    Occupational History  . 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 Smoking status: Never Smoker  . Smokeless tobacco: Never Used  . Alcohol use Yes     Comment: 09/11/2013 "might have a drink once/month"  . Drug use: No  . Sexual activity: Not Currently   Other Topics Concern  . Not on file   Social History Narrative   Patient lives at home with spouse.   Caffeine Use:  eat a lot of chocolate, sodas, coffee and tea occasionally     PHYSICAL EXAM  Vitals:   08/18/16 1405  BP: 138/76  Pulse: 96  Weight: 164 lb 3.2 oz (74.5 kg)   Body mass index is 24.97 kg/m.  Generalized: Well developed, in no acute distress  Neck: Supple, no carotid bruits  Cardiac: REG RATE RHYTHM, SYS MURMUR  Neurological examination  MENTAL STATUS: awake, alert, language fluent, comprehension intact, naming intact  CRANIAL NERVE: pupils equal and reactive to light, visual fields full to confrontation, extraocular muscles: SACCADIC BREAKDOWN OF SMOOTH PURSUIT; END GAZE NYSTAGMUS; PAST POINTING ON SACCADES. Facial sensation and WITH DECR RIGHT EYE CLOSURE WEAKNESS AND RIGHT LOWER FACIAL STRENGTH. SYNKINESES ON RIGHT FACE. Uvula midline, shoulder shrug symmetric, tongue midline.  MOTOR: INCREASED TONE IN BLE > BUE (RIGHT > LEFT). BUE (DELTOID 4+, TRICEP 4, BICEP 5, GRIP 4+), RIGHT LEG (HF 3, KE 3, KF 2, DF 1-2), LEFT LEG (HF 3, KE/KF 3, DF 3) SENSORY: DECR IN RLE TO ALL MODALITIES.  COORDINATION: finger-nose-finger, fine finger movements normal  REFLEXES: BUE 3 (R>L), RLE 3, LLE 2.  GAIT/STATION: DIFF STANDING FROM CHAIR. USES A WALKER. SPASTIC GAIT. RIGHT CIRCUMDUCTION. UNSTEADY.    DIAGNOSTIC DATA (LABS, IMAGING, TESTING) - I reviewed patient records, labs, notes, testing and imaging myself where available.  Lab Results  Component Value Date   WBC 3.8 02/11/2016   HGB 14.9 02/08/2015   HCT 39.7 02/11/2016   MCV 83 02/11/2016   PLT 127 (L) 02/11/2016    CMP Latest Ref Rng & Units 02/11/2016 02/08/2015 01/01/2015  Glucose 65 - 99 mg/dL 74 85 -  BUN 8 - 27 mg/dL 25 26(H) -  Creatinine 0.76 - 1.27 mg/dL 1.12 1.31 1.40(H)  Sodium 134 - 144 mmol/L 148(H) 141 -  Potassium 3.5 - 5.2 mmol/L 4.2 4.3 -  Chloride 96 - 106 mmol/L 105 101 -  CO2 18 - 29 mmol/L 26 29 -  Calcium 8.6 - 10.2 mg/dL 9.3 9.7 -  Total Protein 6.0 - 8.5 g/dL 7.4 7.3 -  Total Bilirubin 0.0 - 1.2 mg/dL 0.4 0.9 -  Alkaline Phos 39 - 117 IU/L 171(H) 209(H) -  AST 0 - 40 IU/L 24 28 -  ALT 0 - 44 IU/L 27 38 -   lymph#  Date Value Ref Range Status  02/10/2012 0.9 0.9 - 3.3 10e3/uL Final  08/10/2011 1.1 0.9 - 3.3 10e3/uL Final  03/12/2010 0.8 (L) 0.9 - 3.3 10e3/uL Final   Lymphocytes Absolute  Date Value Ref Range Status  02/11/2016 0.5 (L) 0.7 - 3.1 x10E3/uL Final  08/02/2014 0.5 (L) 0.7 - 3.1 x10E3/uL Final  01/31/2014 0.3 (L) 0.7 - 3.1 x10E3/uL Final   Lymphs Abs  Date Value Ref Range Status  02/08/2015 0.5 (L) 0.7 - 4.0 K/uL Final    11/16/12 MRI brain - There are multiple periventricular and subcortical and pontine chronic demyelinating plaques. There are several T1 black holes. Mild diffuse atrophy. No acute plaques. In comparison to MRI from 03/26/10, there is no significant change.   08/02/13 MRI brain - multiple brainstem, periventricular, corpus callosal and subcortical white matter hyperintensities compatible with chronic lesions of multiple sclerosis. No enhancing lesions are noted. The presence of T1 black holes and significant atrophy of corpus callosum and cortex indicates chronic disease.  11/16/12 MRI cervical spine - Multiple chronic demyelinating plaques at C2-3, C3-4 and C5-6. No acute plaques. At C5-6: Disc bulging with facet hypertrophy with moderate spinal stenosis, severe right  and moderate left foraminal stenosis. At C3-4, C4-5: Disc bulging with mild spinal stenosis and biforaminal stenosis. In comparison to MRI from 03/26/10 there is no significant  change.   01/20/13 JCV antibody - positive, index 3.37 (H)    ASSESSMENT AND PLAN  70 y.o. old male with Hypertension; Anemia; Depression; Multiple sclerosis; Thrombocytopenia; Dyslipidemia; Aortic dissection, thoracic; Aortic dissection (11/05/1998); Aneurysm of aortic arch (04/04/2012); Chest pain (11/18/2009); CHF (congestive heart failure), Atrial fibrillation (01/30/2013); Pleural effusion, right, large (01/30/2013); Severe tricuspid valve regurgitation (01/30/2013); Mitral regurgitation (02/20/2013); and Lower extremity edema (02/20/2013) here with multiple sclerosis since 1996. Initially on betaseron, now on tecfidera.   Dx:  MS (multiple sclerosis) ([place])  Vitamin D deficiency  Abnormality of gait  Thrombocytopenia ([place])    PLAN: - continue Tecfidera and Ampyra - continue palliative care - CBC, CMP labs per PCP (every 6 months) - continue swimming and PT exercises - high fall risk and on anti-coagulation for atrial fibrillations; precautions reviewed with patient  Return in about 1 year (around 08/18/2017).     Penni Bombard, MD 04/18/6942, 7:00 PM Certified in Neurology, Neurophysiology and Neuroimaging  [facility] [address] [place], [place] [address] [phone]

## 2016-08-24 ENCOUNTER — Other Ambulatory Visit: Payer: Self-pay | Admitting: Diagnostic Neuroimaging

## 2016-10-06 ENCOUNTER — Encounter: Payer: Self-pay | Admitting: *Deleted

## 2016-10-06 NOTE — Progress Notes (Signed)
10/06/16 [facility] [employer] letter rec'vd: Ampyra medication approved 10/05/2016 thru 10/05/2017

## 2016-10-27 ENCOUNTER — Other Ambulatory Visit: Payer: Self-pay | Admitting: Cardiovascular Disease

## 2016-10-27 NOTE — Telephone Encounter (Signed)
LMOM cell phone for patient, past due for INR

## 2016-10-28 NOTE — Telephone Encounter (Signed)
Appointment scheduled for 10/30/16

## 2016-10-30 ENCOUNTER — Ambulatory Visit (INDEPENDENT_AMBULATORY_CARE_PROVIDER_SITE_OTHER): Payer: Medicare Other | Admitting: Pharmacist Clinician (PhC)/ Clinical Pharmacy Specialist

## 2016-10-30 DIAGNOSIS — I481 Persistent atrial fibrillation: Secondary | ICD-10-CM

## 2016-10-30 DIAGNOSIS — Z7901 Long term (current) use of anticoagulants: Secondary | ICD-10-CM | POA: Diagnosis not present

## 2016-10-30 DIAGNOSIS — I4891 Unspecified atrial fibrillation: Secondary | ICD-10-CM | POA: Diagnosis not present

## 2016-10-30 DIAGNOSIS — I4819 Other persistent atrial fibrillation: Secondary | ICD-10-CM

## 2016-10-30 LAB — POCT INR: INR: 2.1

## 2016-11-24 ENCOUNTER — Other Ambulatory Visit: Payer: Self-pay | Admitting: *Deleted

## 2016-11-24 DIAGNOSIS — I71019 Dissection of thoracic aorta, unspecified: Secondary | ICD-10-CM

## 2016-11-24 DIAGNOSIS — I7101 Dissection of thoracic aorta: Secondary | ICD-10-CM

## 2016-11-27 ENCOUNTER — Other Ambulatory Visit (HOSPITAL_COMMUNITY): Payer: Self-pay | Admitting: Internal Medicine

## 2016-11-27 ENCOUNTER — Other Ambulatory Visit: Payer: Self-pay | Admitting: Internal Medicine

## 2016-11-30 ENCOUNTER — Other Ambulatory Visit (HOSPITAL_COMMUNITY): Payer: Self-pay

## 2016-11-30 MED ORDER — POTASSIUM CHLORIDE ER 10 MEQ PO TBCR: EXTENDED_RELEASE_TABLET | ORAL | 3 refills | Status: DC

## 2016-11-30 NOTE — Telephone Encounter (Signed)
Rx(s) sent to pharmacy electronically.[id number]

## 2016-12-04 ENCOUNTER — Other Ambulatory Visit (HOSPITAL_COMMUNITY): Payer: Self-pay | Admitting: Cardiology

## 2016-12-04 ENCOUNTER — Other Ambulatory Visit (HOSPITAL_COMMUNITY): Payer: Self-pay | Admitting: Internal Medicine

## 2016-12-04 MED ORDER — FUROSEMIDE 40 MG PO TABS: 40.0000 mg | ORAL_TABLET | Freq: Two times a day (BID) | ORAL | 0 refills | Status: DC

## 2016-12-11 ENCOUNTER — Ambulatory Visit (INDEPENDENT_AMBULATORY_CARE_PROVIDER_SITE_OTHER): Payer: Medicare Other | Admitting: Pharmacist Clinician (PhC)/ Clinical Pharmacy Specialist

## 2016-12-11 DIAGNOSIS — I481 Persistent atrial fibrillation: Secondary | ICD-10-CM

## 2016-12-11 DIAGNOSIS — I4819 Other persistent atrial fibrillation: Secondary | ICD-10-CM

## 2016-12-11 DIAGNOSIS — I4891 Unspecified atrial fibrillation: Secondary | ICD-10-CM

## 2016-12-11 DIAGNOSIS — Z7901 Long term (current) use of anticoagulants: Secondary | ICD-10-CM | POA: Diagnosis not present

## 2016-12-11 LAB — POCT INR: INR: 2

## 2016-12-16 ENCOUNTER — Other Ambulatory Visit: Payer: Self-pay | Admitting: Diagnostic Neuroimaging

## 2017-01-04 ENCOUNTER — Ambulatory Visit
Admission: RE | Admit: 2017-01-04 | Discharge: 2017-01-04 | Disposition: A | Discharge status: Home / Self Care | Payer: Medicare Other | Source: Ambulatory Visit | Attending: Thoracic Surgery (Cardiothoracic Vascular Surgery) | Admitting: Thoracic Surgery (Cardiothoracic Vascular Surgery)

## 2017-01-04 ENCOUNTER — Encounter: Payer: Self-pay | Admitting: Thoracic Surgery (Cardiothoracic Vascular Surgery)

## 2017-01-04 ENCOUNTER — Ambulatory Visit (INDEPENDENT_AMBULATORY_CARE_PROVIDER_SITE_OTHER): Payer: Medicare Other | Admitting: Thoracic Surgery (Cardiothoracic Vascular Surgery)

## 2017-01-04 VITALS — BP 180/92 | HR 74 | Resp 20 | Ht 68.0 in

## 2017-01-04 DIAGNOSIS — I719 Aortic aneurysm of unspecified site, without rupture: Secondary | ICD-10-CM | POA: Diagnosis not present

## 2017-01-04 DIAGNOSIS — I7121 Aneurysm of the ascending aorta, without rupture: Secondary | ICD-10-CM | POA: Insufficient documentation

## 2017-01-04 DIAGNOSIS — I71011 Dissection of aortic arch: Secondary | ICD-10-CM

## 2017-01-04 DIAGNOSIS — I71019 Dissection of thoracic aorta, unspecified: Secondary | ICD-10-CM

## 2017-01-04 DIAGNOSIS — I7122 Aneurysm of the aortic arch, without rupture: Secondary | ICD-10-CM

## 2017-01-04 DIAGNOSIS — I712 Thoracic aortic aneurysm, without rupture: Secondary | ICD-10-CM | POA: Diagnosis not present

## 2017-01-04 DIAGNOSIS — I7101 Dissection of thoracic aorta: Secondary | ICD-10-CM | POA: Diagnosis not present

## 2017-01-04 MED ORDER — IOPAMIDOL (ISOVUE-370) INJECTION 76%
75.0000 mL | Freq: Once | INTRAVENOUS | Status: DC | PRN
Start: 2017-01-04 — End: 2017-01-05

## 2017-01-04 NOTE — Progress Notes (Signed)
[address]       [place] [address]             [phone]     CARDIOTHORACIC SURGERY OFFICE NOTE  Referring Provider is [name], MD PCP is GREEN, [name], MD   HPI:  Patient is a 71 year old male with complex past medical history including severe multiple sclerosis who returns to the office today for routine follow-up of chronic type A aortic dissection status post repair in 2000 with progressive aneurysmal enlargement of the remaining dilated aortic root and chronically dissected transverse aortic arch.  The patient also has chronic diastolic and right sided congestive heart failure, tricuspid regurgitation, and atrial fibrillation. He was last seen in our office on 12/30/2015 and shortly after that he underwent CT angiogram which revealed interval increase size in the patient's aortic root aneurysm as well as the aneurysm of the chronically dissected aortic arch. Because of the patient's underlying multiple sclerosis with severe generalized weakness patient was felt to be candidate for surgical intervention. He was referred to [facility] for a second opinion and again not felt to be candidate for elective surgery. He returns to our office for follow-up today. Since that time the patient has remained remarkably stable. He states that overall his multiple sclerosis continues to slowly progress, but his daily routines and his physical activity have not changed dramatically since last year. In addition, he has done remarkably well with management of his chronic congestive heart failure.  Overall he has no new complaints. He does keep track of his blood pressure and recent recordings have been somewhat elevated, ranging between 140 and 170 mmHg. He has not had any pain in his chest or back suggestive of pain related to his thoracic aortic aneurysm. He has not had problems with headaches or visual disturbances.   Current Outpatient  Prescriptions  Medication Sig Dispense Refill  . AMPYRA 10 MG TB12 TAKE ONE TABLET (10 MG) BY MOUTH TWICE DAILY (APPROXIMATELY 12 HOURS APART). MAY BE TAKEN WITH OR WITHOUT FOOD. DO NOT CUT OR CRUSH. STORE A 180 tablet 3  . Cholecalciferol (VITAMIN D3) 2000 UNITS TABS Take 1 capsule by mouth daily.    . ferrous sulfate 325 (65 FE) MG tablet Take 1 tablet (325 mg total) by mouth 3 (three) times daily with meals. 90 tablet 1  . folic acid (FOLVITE) 1 MG tablet Take 1 mg by mouth daily.      . furosemide (LASIX) 40 MG tablet Take 1 tablet (40 mg total) by mouth 2 (two) times daily. Needs a OV for further refills 60 tablet 0  . losartan (COZAAR) 50 MG tablet 50 mg daily.    . NON FORMULARY Take 2 tablets by mouth daily. OTC Laxative    . potassium chloride (K-DUR) 10 MEQ tablet TAKE (2) TABLETS DAILY. 60 tablet 3  . QUEtiapine (SEROQUEL) 25 MG tablet Take 25 mg by mouth at bedtime as needed.     . TECFIDERA 240 MG CPDR TAKE ONE CAPSULE (240 MG) BY MOUTH TWICE DAILY. STORE IN ORIGINAL CONTAINER AT ROOM TEMPERATURE. 180 capsule 1  . triamcinolone cream (KENALOG) 0.1 %     . warfarin (COUMADIN) 2 MG tablet TAKE 1 TO 1&1/2 TABLETS DAILY AS DIRECTED BY THE COUMADIN CLINIC. 120 tablet 0   No current facility-administered medications for this visit.    Facility-Administered Medications Ordered in Other Visits  Medication Dose Route Frequency Provider Last Rate Last  Dose  . iopamidol (ISOVUE-370) 76 % injection 75 mL  75 mL Intravenous Once PRN Rexene Alberts, MD          Physical Exam:   BP (!) 180/92   Pulse 74   Resp 20   Ht 5\' 8"  (1.727 m)   SpO2 99% Comment: RA  General:  Chronically debilitated  Chest:   Clear to auscultation  CV:   Regular rate and rhythm  Incisions:  n/a  Abdomen:  Soft nontender  Extremities:  Warm and well-perfused, no edema  Diagnostic Tests:  CT ANGIOGRAPHY CHEST, ABDOMEN AND PELVIS  TECHNIQUE: Multidetector CT imaging through the chest, abdomen and pelvis  was performed using the standard protocol during bolus administration of intravenous contrast. Multiplanar reconstructed images and MIPs were obtained and reviewed to evaluate the vascular anatomy.  Creatinine was obtained on site at [place] at [address]  Results: Creatinine 1.1 mg/dL.  CONTRAST:  75 mL Isovue 370  COMPARISON:  Prior CTA chest, abdomen and pelvis 01/07/2016  FINDINGS: CTA CHEST FINDINGS  Cardiovascular: Similar appearance of surgical repair of the tubular portion of the ascending thoracic aorta with residual at aneurysmal dilatation of the aortic root and aneurysmal dissection of the transverse and descending thoracic aorta. The aortic root is stable in size at 5.7 cm. The non coronary sinus of Valsalva is similar in size at 4.5 cm. The aneurysmal residual ascending thoracic aorta is unchanged at 5.6 cm. The dissection flap again extends into the right brachiocephalic artery and into the right common carotid artery as well as into the left subclavian artery. The left common carotid artery remains spared. The dissection flap also propagates distally throughout the descending thoracic aorta which remains mildly ectatic at 3.6 cm just above the hiatus.  Mediastinum/Nodes: Unremarkable CT appearance of the thyroid gland. No suspicious mediastinal or hilar adenopathy. No soft tissue mediastinal mass. The thoracic esophagus is unremarkable.  Lungs/Pleura: New 2- 3 mm ground-glass attenuation airspace opacity in the right upper lung (image 53 series 5). Small right pleural effusion is also new. There is associated right lower lobe atelectasis. No new suspicious pulmonary nodule or mass. Subpleural lymph nodes and a small calcified granuloma remain unchanged.  Musculoskeletal: No acute fracture or aggressive appearing lytic or blastic osseous lesion. Healed median sternotomy.  Review of the MIP images confirms the above  findings.  CTA ABDOMEN AND PELVIS FINDINGS  VASCULAR  Aorta: The dissection flap extends throughout the abdominal aorta and into the right external iliac artery. The aorta remains within normal limits in size.  Celiac: Arises from the true lumen.  No aneurysm or dissection.  SMA: Arises from the true lumen.  No aneurysm or dissection.  Renals: Both right and left main renal arteries arise from the true lumen. Heterogeneous atherosclerotic plaque results in at least mild narrowing at the origin of the left renal artery.  IMA: Arises from the true lumen.  No dissection or occlusion.  Inflow: The dissected right common iliac artery remains ectatic bordering on aneurysmal at 2.1 cm in diameter. There is improved enhancement throughout the false lumen. The right internal iliac artery remains patent. The dissection flap extends into the distal external iliac artery. No significant interval change. The left common, internal and external iliac arteries demonstrate atherosclerotic vascular plaque without focal stenosis, aneurysm or dissection.  Veins: No focal venous abnormality.  Review of the MIP images confirms the above findings.  NON-VASCULAR  Hepatobiliary: Normal hepatic contour and morphology. No discrete  hepatic lesions. Cholelithiasis. No intra or extrahepatic biliary ductal dilatation.  Pancreas: 2 year stability of a tiny 3 mm low-attenuation lesion in the posterior aspect of the pancreatic head (image 158 series 4). Otherwise, no lesion or evidence of inflammation.  Spleen: Normal in size without focal abnormality.  Adrenals/Urinary Tract: The adrenal glands are within normal limits. Similar appearance of multiple simple renal cysts bilaterally. 3 mm nonobstructing stone in the upper pole of the right kidney. Punctate stone in the interpolar right kidney. No definite nephrolithiasis on the left. No hydronephrosis or enhancing renal  mass.  Stomach/Bowel: No evidence of obstruction or focal bowel wall thickening. Normal appendix in the right lower quadrant. The terminal ileum is unremarkable.  Lymphatic: No suspicious lymphadenopathy.  Reproductive: Prostate is unremarkable.  Other: No abdominal wall hernia or abnormality. No abdominopelvic ascites.  Musculoskeletal: No acute fracture or aggressive appearing lytic or blastic osseous lesion.  Review of the MIP images confirms the above findings.  IMPRESSION: CTA CHEST  1. Stable surgical changes of prior tube graft repair of ascending aortic aneurysm. 2. No significant interval change in aneurysmal dilatation of the aortic root, the non coronary sinus of Valsalva, and the residual tubular and proximal transverse thoracic aorta compared to 01/07/2016. 3. Stable dissection flap propagation into the left subclavian artery and right common carotid artery. 4. New small 3 mm focus of ground-glass attenuation opacity in the right upper lobe may represent a region of active infection/inflammation, or less likely a developing ground-glass pulmonary nodule. Recommend attention on follow-up imaging in 1 year. CTA ABD/PELVIS  1. Stable extension of the dissection flap into the right external iliac artery. Significantly improved contrast opacification of the false lumen. 2. Continued slight interval enlargement of an enhancing soft tissue nodule adherent to the wall of the gallbladder fundus which now measures up to 1.3 cm. This is concerning for an enlarging gallbladder polyp. Recommend further evaluation with right upper quadrant ultrasound. 3. Additional ancillary findings as above without significant interval change.  These results will be called to the ordering clinician or representative by the Radiologist Assistant, and communication documented in the PACS or zVision Dashboard.  Signed,  Criselda Peaches, MD  [facility]  [facility]   Electronically Signed   By: Jacqulynn Cadet M.D.   On: 01/04/2017 15:54   Impression:  Patient remains clinically stable with no symptoms or physical signs that might be related to his chronic aneurysm of the aortic root or chronically dissected thoracic aorta. His heart failure appears to be well-controlled on medical therapy. I have personally reviewed the patient's recent CT angiogram performed earlier today which demonstrates stable radiographic appearance over the past year. The patient's blood pressure is running somewhat high.   Plan:  We have not recommended any changes to the patient's current medications. However, I have suggested that the patient contact his primary care physician as soon as possible to discuss further adjustments to his antihypertensive medications. He might benefit from addition of a beta blocker and/or calcium channel blocker. Alternatively, it is possible that his losartan dose might be increased. Patient will return to our office for another follow-up CT angiogram in 1 year. All of his questions been addressed.  I spent in excess of 15 minutes during the conduct of this office consultation and >50% of this time involved direct face-to-face encounter with the patient for counseling and/or coordination of their care.    Valentina Gu. Roxy Manns, MD 01/04/2017 4:47 PM

## 2017-01-04 NOTE — Patient Instructions (Addendum)
Continue all previous medications without any changes at this time  Contact your primary care physician to discuss adjusting your blood pressure medications.  Your blood pressure has been running higher than recommended.  Patient is advised regarding the need for life long follow-up and physical restrictions related to the history of aortic dissection including the need for strict blood pressure control, periodic radiographic follow-up examinations, and avoidance of certain types of physical activity such as heavy lifting or other strenuous physical activities which tend to increase pressure within the chest or abdomen.

## 2017-01-07 ENCOUNTER — Encounter (HOSPITAL_COMMUNITY): Payer: Self-pay | Admitting: Internal Medicine

## 2017-01-07 ENCOUNTER — Ambulatory Visit (HOSPITAL_BASED_OUTPATIENT_CLINIC_OR_DEPARTMENT_OTHER)
Admission: RE | Admit: 2017-01-07 | Discharge: 2017-01-07 | Disposition: A | Discharge status: Home / Self Care | Payer: Medicare Other | Source: Ambulatory Visit | Attending: Internal Medicine | Admitting: Internal Medicine

## 2017-01-07 ENCOUNTER — Ambulatory Visit (HOSPITAL_COMMUNITY)
Admission: RE | Admit: 2017-01-07 | Discharge: 2017-01-07 | Disposition: A | Discharge status: Home / Self Care | Payer: Medicare Other | Source: Ambulatory Visit | Attending: Internal Medicine | Admitting: Internal Medicine

## 2017-01-07 VITALS — BP 186/100 | HR 84 | Wt 168.0 lb

## 2017-01-07 DIAGNOSIS — Q2549 Other congenital malformations of aorta: Secondary | ICD-10-CM | POA: Insufficient documentation

## 2017-01-07 DIAGNOSIS — I083 Combined rheumatic disorders of mitral, aortic and tricuspid valves: Secondary | ICD-10-CM | POA: Insufficient documentation

## 2017-01-07 DIAGNOSIS — I071 Rheumatic tricuspid insufficiency: Secondary | ICD-10-CM

## 2017-01-07 DIAGNOSIS — I7100 Dissection of unspecified site of aorta: Secondary | ICD-10-CM

## 2017-01-07 DIAGNOSIS — I50811 Acute right heart failure: Secondary | ICD-10-CM | POA: Insufficient documentation

## 2017-01-07 DIAGNOSIS — I5032 Chronic diastolic (congestive) heart failure: Secondary | ICD-10-CM | POA: Diagnosis not present

## 2017-01-07 DIAGNOSIS — I4819 Other persistent atrial fibrillation: Secondary | ICD-10-CM

## 2017-01-07 DIAGNOSIS — I481 Persistent atrial fibrillation: Secondary | ICD-10-CM

## 2017-01-07 LAB — BASIC METABOLIC PANEL
Anion gap: 7 (ref 5–15)
BUN: 25 mg/dL — ABNORMAL HIGH (ref 6–20)
CO2: 29 mmol/L (ref 22–32)
Calcium: 9.5 mg/dL (ref 8.9–10.3)
Chloride: 104 mmol/L (ref 101–111)
Creatinine, Ser: 1.23 mg/dL (ref 0.61–1.24)
GFR calc Af Amer: >60 mL/min (ref >60)
GFR calc non Af Amer: 58 mL/min — ABNORMAL LOW (ref >60)
Glucose, Bld: 89 mg/dL (ref 65–99)
Potassium: 4.3 mmol/L (ref 3.5–5.1)
Sodium: 140 mmol/L (ref 135–145)

## 2017-01-07 MED ORDER — SPIRONOLACTONE 25 MG PO TABS: 25.0000 mg | ORAL_TABLET | Freq: Every day | ORAL | 3 refills | Status: DC

## 2017-01-07 MED ORDER — LOSARTAN POTASSIUM 100 MG PO TABS: 100.0000 mg | ORAL_TABLET | Freq: Every day | ORAL | 3 refills | Status: DC

## 2017-01-07 NOTE — Progress Notes (Signed)
ADVANCED HEART FAILURE CLINIC NOTE  Patient ID: [name], male   DOB: [birth year], 71 y.o.   MRN: [id number] Referring MD : Dr Roxy Manns Cardiologist: Dr [name]  PCP: Dr Zada Girt   HPI: [name] is a 71 year old retired Engineer, production professor with a history of advanced multiple sclerosis, Type A aortic dissection 2000, right heart failure with severe TR and chronic AF referred by [name] for further evaluation for possible TV repair.   He presented acutely on October 15, 1998 with acute type A aortic dissection. He underwent emergency surgical repair with hemi-arch replacement of the ascending thoracic aorta and resuspension of the native aortic valve. 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 He has been followed closely in [facility] office due to interval increase size in the patient's aortic root aneurysm as well as the aneurysm of the chronically dissected aortic arch (last seen earlier this week). Because of the patient's underlying multiple sclerosis with severe generalized weakness patient was felt to be candidate for surgical intervention. He was referred to [facility] for a second opinion and again not felt to be candidate for elective surgery  Admitted in December 2014 with right heart failure including severe bilateral lower extremity edema and generalized anasarca. Diuresed well and was started on Coumadin for long-term anticoagulation because of chronic persistent atrial fibrillation.  Echo in 3/15 actually showed that tricuspid regurgitation looked more moderate; EF 55%, mild AI, aortic root at sinuses of valsalva 4.8 cm, moderate Timothy, mildly dilated RV moderate TR, PA systolic pressure 46 mmHg.   Here for f/u; Seen earlier this week in [facility] office and CT showed No significant interval change in aneurysmal dilatation of the aortic  root, the non coronary sinus of Valsalva, and the residual tubular and proximal transverse thoracic aorta compared to 01/07/2016. Unfortunately has been struggling with progressive MS symptoms. BP also been running high with systolics running 509-326. Says they have been eating out a lot and fluid goes up and down. Takes lasix 40 bid. If fluid goes up he will go on strict low salt diet for several days. Has not taken extra lasix. No orthopnea or PND. Weight fluctuates 155-161. Swims 1/4 mile religiously.   Echo today reviewed personally LVEF 60-65% RV dilated with normal function. Moderate to severe TR. AoRoot dilated 6.0 cm with mild AI   Labs (4/15): K 3.7, creatinine 1.07  Current Outpatient Prescriptions on File Prior to Encounter  Medication Sig Dispense Refill  . AMPYRA 10 MG TB12 TAKE ONE TABLET (10 MG) BY MOUTH TWICE DAILY (APPROXIMATELY 12 HOURS APART). MAY BE TAKEN WITH OR WITHOUT FOOD. DO NOT CUT OR CRUSH. STORE A 180 tablet 3  . Cholecalciferol (VITAMIN D3) 2000 UNITS TABS Take 1 capsule by mouth daily.    . ferrous sulfate 325 (65 FE) MG tablet Take 1 tablet (325 mg total) by mouth 3 (three) times daily with meals. 90 tablet 1  . folic acid (FOLVITE) 1 MG tablet Take 1 mg by mouth daily.      . furosemide (LASIX) 40 MG tablet Take 1 tablet (40 mg total) by mouth 2 (two) times daily. Needs a OV for further refills 60 tablet 0  . losartan (COZAAR) 50 MG tablet 50 mg daily.    . NON FORMULARY Take 2 tablets by mouth daily. OTC Laxative    . potassium  chloride (K-DUR) 10 MEQ tablet TAKE (2) TABLETS DAILY. 60 tablet 3  . QUEtiapine (SEROQUEL) 25 MG tablet Take 25 mg by mouth at bedtime as needed.     . TECFIDERA 240 MG CPDR TAKE ONE CAPSULE (240 MG) BY MOUTH TWICE DAILY. STORE IN ORIGINAL CONTAINER AT ROOM TEMPERATURE. 180 capsule 1  . triamcinolone cream (KENALOG) 0.1 %     . warfarin (COUMADIN) 2 MG tablet TAKE 1 TO 1&1/2 TABLETS DAILY AS DIRECTED BY THE COUMADIN CLINIC. 120 tablet 0     No current facility-administered medications on file prior to encounter.       PHYSICAL EXAM: Vitals:   01/07/17 1525  BP: (!) 186/100  Pulse: 84   General:  Walks with walker  No respiratory difficulty HEENT: normal Neck: supple. JVP to ear with prominent CV waves. Carotids 2+ bilat; no bruits. No lymphadenopathy or thryomegaly appreciated. Cor: PMI nondisplaced. IRR 2/6 TR Lungs: clear.  Abdomen: soft, nontender,mildy distended No hepatosplenomegaly. No bruits or masses. Good bowel sounds. Extremities: no cyanosis, clubbing, rash, 1+ edema.  Brace on RLE Neuro: alert & oriented x 3, cranial nerves grossly intact. moves all 4 extremities w/o difficulty. Affect pleasant.   ASSESSMENT & PLAN: 1. Right heart failure with moderate to severe TR  --echo reviewed personally. LV/RV function stable. TR remains moderate to severe --volume status mildly elevated. Will add spiro. Discussed use of sliding scale lasix in detail. --check BMET today, 1 week and 4 weeks on spiro. Stop KCL with spiro --He is not a candidate for a tricuspid valve operation.  2. Aortic dissection:     --s/p hemi-arch replacement of the ascending thoracic aorta and resuspension of the native aortic valve 2000    --now with chronic dissection and enlarging aneurysm involving sinus of Valsalva and aortic arch    --CTA earlier this week was stable. Continue to follow with [facility]    --Needs better BP control. Double losartan to 100 daily. Add spiro    --Has f/u with [name]. I will see back in 2 months 3. HTN     --as above. BP poorly controlled. Goal SBP 110-130. Changes as above.     --Will need to add b-blocker at next visit to keep sheer forces down as tolerated.  4. Chronic AF:     --rate controlled. On warfarin.  5. Severe multiple sclerosis    --continue to follow with Neuro   Tina Gruner,MD 01/07/2017

## 2017-01-07 NOTE — Progress Notes (Signed)
  Echocardiogram 2D Echocardiogram has been performed.  Serah Nicoletti L Androw 01/07/2017, 3:18 PM

## 2017-01-07 NOTE — Patient Instructions (Addendum)
START Spironolactone 25mg  daily.  INCREASE Losartan to 100mg  daily.  STOP Potassium.  Routine lab work today. Will notify you of abnormal results  Repeat lab work (bmet) in 1 week and again in 4 weeks.  Follow up with Dr.Bensimhon in 2-3 months

## 2017-01-08 LAB — ECHOCARDIOGRAM COMPLETE
E decel time: 134 msec
E/e' ratio: 6.06
FS: 33 % (ref 28–44)
IVS/LV PW RATIO, ED: 1.01
LA ID, A-P, ES: 44 mm
LA diam end sys: 44 mm
LA diam index: 2.34 cm/m2
LA vol A4C: 83.2 ml
LA vol index: 59 mL/m2
LA vol: 111 mL
LV E/e' medial: 6.06
LV E/e'average: 6.06
LV PW d: 13.4 mm — AB (ref 0.6–1.1)
LV dias vol index: 89 mL/m2
LV dias vol: 167 mL — AB (ref 62–150)
LV e' LATERAL: 17.5 cm/s
LV sys vol index: 35 mL/m2
LV sys vol: 66 mL — AB
LVOT SV: 78 mL
LVOT VTI: 24.7 cm
LVOT area: 3.14 cm2
LVOT diameter: 20 mm
LVOT peak grad rest: 6 mmHg
LVOT peak vel: 127 cm/s
Lateral S' vel: 11 cm/s
MV Dec: 134
MV Peak grad: 4 mmHg
MV pk E vel: 106 m/s
P 1/2 time: 676 ms
RV sys press: 38 mmHg
Reg peak vel: 241 cm/s
Simpson's disk: 61
Stroke v: 101 ml
TAPSE: 22.3 mm
TDI e' lateral: 17.5
TDI e' medial: 14.4
TR max vel: 241 cm/s

## 2017-01-09 ENCOUNTER — Other Ambulatory Visit (HOSPITAL_COMMUNITY): Payer: Self-pay | Admitting: Internal Medicine

## 2017-01-11 ENCOUNTER — Encounter: Payer: Self-pay | Admitting: Cardiovascular Disease

## 2017-01-12 ENCOUNTER — Telehealth: Payer: Self-pay | Admitting: Cardiovascular Disease

## 2017-01-12 MED ORDER — WARFARIN SODIUM 2 MG PO TABS: ORAL_TABLET | ORAL | 0 refills | Status: DC

## 2017-01-12 NOTE — Telephone Encounter (Signed)
New Message    *STAT* If patient is at the pharmacy, call can be transferred to refill team.   1. Which medications need to be refilled? (please list name of each medication and dose if known) warfarin (COUMADIN) 2 MG tablet  2. Which pharmacy/location (including street and city if local pharmacy) is medication to be sent to? [place], [place].  3. Do they need a 30 day or 90 day supply? 90 day supply

## 2017-01-14 ENCOUNTER — Other Ambulatory Visit (HOSPITAL_COMMUNITY): Payer: Self-pay | Admitting: Cardiology

## 2017-01-14 ENCOUNTER — Ambulatory Visit (HOSPITAL_COMMUNITY)
Admission: RE | Admit: 2017-01-14 | Discharge: 2017-01-14 | Disposition: A | Discharge status: Home / Self Care | Payer: Medicare Other | Source: Ambulatory Visit | Attending: Cardiology | Admitting: Cardiology

## 2017-01-14 DIAGNOSIS — I5032 Chronic diastolic (congestive) heart failure: Secondary | ICD-10-CM

## 2017-01-14 LAB — BASIC METABOLIC PANEL
Anion gap: 7 (ref 5–15)
BUN: 34 mg/dL — ABNORMAL HIGH (ref 6–20)
CO2: 28 mmol/L (ref 22–32)
Calcium: 9.5 mg/dL (ref 8.9–10.3)
Chloride: 105 mmol/L (ref 101–111)
Creatinine, Ser: 1.35 mg/dL — ABNORMAL HIGH (ref 0.61–1.24)
GFR calc Af Amer: >60 mL/min (ref >60)
GFR calc non Af Amer: 52 mL/min — ABNORMAL LOW (ref >60)
Glucose, Bld: 103 mg/dL — ABNORMAL HIGH (ref 65–99)
Potassium: 4.4 mmol/L (ref 3.5–5.1)
Sodium: 140 mmol/L (ref 135–145)

## 2017-01-14 MED ORDER — FUROSEMIDE 40 MG PO TABS: 40.0000 mg | ORAL_TABLET | Freq: Two times a day (BID) | ORAL | 3 refills | Status: DC

## 2017-01-14 NOTE — Telephone Encounter (Signed)
WALK IN FORM REQUEST Patient requested additional tablets for his sliding scale usage.

## 2017-01-22 ENCOUNTER — Ambulatory Visit (INDEPENDENT_AMBULATORY_CARE_PROVIDER_SITE_OTHER): Payer: Medicare Other | Admitting: Pharmacist Clinician (PhC)/ Clinical Pharmacy Specialist

## 2017-01-22 DIAGNOSIS — I4891 Unspecified atrial fibrillation: Secondary | ICD-10-CM

## 2017-01-22 DIAGNOSIS — I4819 Other persistent atrial fibrillation: Secondary | ICD-10-CM

## 2017-01-22 DIAGNOSIS — I481 Persistent atrial fibrillation: Secondary | ICD-10-CM

## 2017-01-22 DIAGNOSIS — Z7901 Long term (current) use of anticoagulants: Secondary | ICD-10-CM

## 2017-01-22 LAB — POCT INR: INR: 2.4

## 2017-02-04 ENCOUNTER — Ambulatory Visit (HOSPITAL_COMMUNITY)
Admission: RE | Admit: 2017-02-04 | Discharge: 2017-02-04 | Disposition: A | Discharge status: Home / Self Care | Payer: Medicare Other | Source: Ambulatory Visit | Attending: Internal Medicine | Admitting: Internal Medicine

## 2017-02-04 DIAGNOSIS — I5032 Chronic diastolic (congestive) heart failure: Secondary | ICD-10-CM | POA: Diagnosis not present

## 2017-02-04 LAB — BASIC METABOLIC PANEL
Anion gap: 10 (ref 5–15)
BUN: 40 mg/dL — ABNORMAL HIGH (ref 6–20)
CO2: 26 mmol/L (ref 22–32)
Calcium: 9.8 mg/dL (ref 8.9–10.3)
Chloride: 105 mmol/L (ref 101–111)
Creatinine, Ser: 1.44 mg/dL — ABNORMAL HIGH (ref 0.61–1.24)
GFR calc Af Amer: 56 mL/min — ABNORMAL LOW (ref >60)
GFR calc non Af Amer: 48 mL/min — ABNORMAL LOW (ref >60)
Glucose, Bld: 95 mg/dL (ref 65–99)
Potassium: 4.1 mmol/L (ref 3.5–5.1)
Sodium: 141 mmol/L (ref 135–145)

## 2017-03-09 ENCOUNTER — Ambulatory Visit (INDEPENDENT_AMBULATORY_CARE_PROVIDER_SITE_OTHER): Payer: Medicare Other | Admitting: Pharmacist Clinician (PhC)/ Clinical Pharmacy Specialist

## 2017-03-09 DIAGNOSIS — Z7901 Long term (current) use of anticoagulants: Secondary | ICD-10-CM

## 2017-03-09 DIAGNOSIS — I4891 Unspecified atrial fibrillation: Secondary | ICD-10-CM | POA: Diagnosis not present

## 2017-03-09 DIAGNOSIS — I4819 Other persistent atrial fibrillation: Secondary | ICD-10-CM

## 2017-03-09 DIAGNOSIS — I481 Persistent atrial fibrillation: Secondary | ICD-10-CM | POA: Diagnosis not present

## 2017-03-09 LAB — POCT INR: INR: 2

## 2017-04-06 ENCOUNTER — Telehealth (HOSPITAL_COMMUNITY): Payer: Self-pay | Admitting: Vascular Surgery

## 2017-04-06 NOTE — Telephone Encounter (Signed)
Left pt message to make f/u w/ DB IN Sept

## 2017-04-07 ENCOUNTER — Telehealth: Payer: Self-pay | Admitting: *Deleted

## 2017-04-07 NOTE — Telephone Encounter (Signed)
[facility] pharmacy shipped out 03-30-17 tecfidera and ampyra ER to pt.

## 2017-04-21 ENCOUNTER — Other Ambulatory Visit: Payer: Self-pay | Admitting: Cardiovascular Disease

## 2017-04-23 ENCOUNTER — Encounter: Payer: Self-pay | Admitting: Pharmacist

## 2017-04-23 ENCOUNTER — Ambulatory Visit (INDEPENDENT_AMBULATORY_CARE_PROVIDER_SITE_OTHER): Payer: Medicare Other | Admitting: Pharmacist

## 2017-04-23 DIAGNOSIS — Z7901 Long term (current) use of anticoagulants: Secondary | ICD-10-CM

## 2017-04-23 DIAGNOSIS — I4891 Unspecified atrial fibrillation: Secondary | ICD-10-CM | POA: Diagnosis not present

## 2017-04-23 DIAGNOSIS — I4819 Other persistent atrial fibrillation: Secondary | ICD-10-CM

## 2017-04-23 DIAGNOSIS — I481 Persistent atrial fibrillation: Secondary | ICD-10-CM

## 2017-04-23 LAB — POCT INR: INR: 1.6

## 2017-04-23 MED ORDER — WARFARIN SODIUM 2 MG PO TABS: ORAL_TABLET | ORAL | 0 refills | Status: DC

## 2017-04-23 NOTE — Telephone Encounter (Signed)
This encounter was created in error - please disregard.

## 2017-05-05 ENCOUNTER — Other Ambulatory Visit (HOSPITAL_COMMUNITY): Payer: Self-pay | Admitting: Internal Medicine

## 2017-05-11 ENCOUNTER — Telehealth: Payer: Self-pay | Admitting: Diagnostic Neuroimaging

## 2017-05-11 NOTE — Telephone Encounter (Signed)
Pt called from a doctor's office re: his AMPYRA 10 MG TB12, he has misplaced it and would like to know if a 30 day supply can be called into the [facility] [address] in [place].  Pt not requesting a call back, only if this can not be done for him

## 2017-05-12 ENCOUNTER — Ambulatory Visit (INDEPENDENT_AMBULATORY_CARE_PROVIDER_SITE_OTHER): Payer: Medicare Other | Admitting: Pharmacist Clinician (PhC)/ Clinical Pharmacy Specialist

## 2017-05-12 DIAGNOSIS — Z7901 Long term (current) use of anticoagulants: Secondary | ICD-10-CM

## 2017-05-12 DIAGNOSIS — I481 Persistent atrial fibrillation: Secondary | ICD-10-CM

## 2017-05-12 DIAGNOSIS — I4891 Unspecified atrial fibrillation: Secondary | ICD-10-CM | POA: Diagnosis not present

## 2017-05-12 DIAGNOSIS — I4819 Other persistent atrial fibrillation: Secondary | ICD-10-CM

## 2017-05-12 LAB — POCT INR: INR: 2.1

## 2017-05-12 MED ORDER — DALFAMPRIDINE ER 10 MG PO TB12: ORAL_TABLET | ORAL | 0 refills | Status: DC

## 2017-05-12 NOTE — Telephone Encounter (Signed)
One month Ampyra sent to [facility] at patient's request. No refills given.

## 2017-05-12 NOTE — Addendum Note (Signed)
Addended by: Florian Buff C on: 05/12/2017 10:59 AM   Modules accepted: Orders

## 2017-05-20 ENCOUNTER — Telehealth: Payer: Self-pay | Admitting: *Deleted

## 2017-05-20 NOTE — Telephone Encounter (Signed)
Received fax from [facility], ampyra er shippped 05-11-17.

## 2017-05-25 ENCOUNTER — Ambulatory Visit (INDEPENDENT_AMBULATORY_CARE_PROVIDER_SITE_OTHER): Payer: Medicare Other | Admitting: Cardiovascular Disease

## 2017-05-25 ENCOUNTER — Encounter: Payer: Self-pay | Admitting: Cardiovascular Disease

## 2017-05-25 VITALS — BP 148/70 | HR 70 | Ht 68.0 in | Wt 158.0 lb

## 2017-05-25 DIAGNOSIS — I481 Persistent atrial fibrillation: Secondary | ICD-10-CM

## 2017-05-25 DIAGNOSIS — I71019 Dissection of thoracic aorta, unspecified: Secondary | ICD-10-CM

## 2017-05-25 DIAGNOSIS — I071 Rheumatic tricuspid insufficiency: Secondary | ICD-10-CM | POA: Diagnosis not present

## 2017-05-25 DIAGNOSIS — R600 Localized edema: Secondary | ICD-10-CM | POA: Diagnosis not present

## 2017-05-25 DIAGNOSIS — I7101 Dissection of thoracic aorta: Secondary | ICD-10-CM

## 2017-05-25 DIAGNOSIS — I4819 Other persistent atrial fibrillation: Secondary | ICD-10-CM

## 2017-05-25 MED ORDER — RIVAROXABAN 20 MG PO TABS: 20.0000 mg | ORAL_TABLET | Freq: Every day | ORAL | 6 refills | Status: DC

## 2017-05-25 NOTE — Assessment & Plan Note (Signed)
Probably multifactorial related to dietary indiscretion as well as TR controlled with oral diuretics.

## 2017-05-25 NOTE — Progress Notes (Signed)
05/25/2017 AWAB ABEBE   [birth year]  [id number]  Primary Physician Levin Erp, MD Primary Cardiologist: [name] MD Garret Reddish, [place], [place]  HPI:  [name] is a 71 y.o. male male who I last saw in the office 05/05/16. He has a history of repair of type A aortic dissection with resuspension of the native aortic valve in February 2000 Dr. Ricard Dillon, chronic aortic dissection extends from the descending, multiple sclerosis  visualized thoracic aorta into the right common iliac artery, dyslipidemia, hypertension, anemia. Patient's last 2-D echocardiogram was 01/17/2013 revealed an ejection fraction of 50-55%.. Wall motion was normal. Uric valve surgery with a mild central regurgitation. Aortic root was dilated ST junction. Mild to moderate mitral valve regurgitation. Left H. and was severely dilated 35.5 cm. The right ventricle cavity size mildly dilated. Right atrium was severely dilated at 32.5 cm tricuspid valve showed severe regurgitation with reversal of flow in the hepatic veins. Dilated tricuspid annulus measuring 6.16 cm. The pulmonary pressure was 44 mmHg. Patient's last nuclear stress test was in 2011 and showed no significant ischemia was considered low risk. CT of the abdomen and pelvis 4/29.14 showed chronic aortic dissection that extends into the right common iliac artery. Small amount of ascites. Large right pleural effusion. Diffuse subcutaneous edema. These findings may be on the basis of elevated right heart filling pressures supported by cardiomegaly. The patient presents for evaluation. He reports the first signs signs of dyspnea on exertion emotion or edema approximately 2 years ago but recently has gotten severely more traumatic. Reports increased lower extremity edema and dyspnea on exertion. He usually swims but as of one month ago he had to stop swimming because of increased shortness of breath and fatigue. He denies orthopnea, PND, chest pain, nausea,  vomiting, fever, palpitations. He does report dizziness when stooping over on occasion. EKG done in our office shows atrial fibrillation with controlled ventricular rate, 62 beats per minute. [name] has adjusted his diuretics which has resulted in moderate improvement in his lower gemmae edema. He remains in atrial fibrillation with a controlled ventricular response. I believe his dyspnea and edema are related to a combination of his severe TR along with his A. Fib. He is a candidate for Coumadin anticoagulation for stroke prophylaxis which has since been started and followed here in our office. He saw [name] for consideration of tricuspid valve annuloplasty but thought to be too high risk for surgical intervention. He is on low-dose diuretic with minimal peripheral edema. He does have multiple sclerosis and is minimally ambulatory and spent most of his day in the chair although he does swim every other day. He was admitted with right heart failure decompensation in the late December of last year and was diuresed. Again he was seen by [name] and [name] e in consultation with Dr. Mali Hughs at [facility] all felt that surgical correction of this because of regurgitation was high risk and therefore medical therapy was recommended. Since I saw him in the office last 01/04/14 his remains stable from a heart point of view. His multiple sclerosis has somewhat progressed. [facility] He denies chest pain or shortness of breath. He saw Dr. Jeffie Pollock  in the office 01/07/17. Echo revealed moderate to severe TR, preserved LV function with an aortic root dilated from 5-5.9 cm. His lower extremity edema is controlled with diuretics and dietary restriction of salt.  No outpatient prescriptions have  been marked as taking for the 05/25/17 encounter (Office Visit) with [name], MD.     No Known Allergies  Social History   Social History  . Marital status: Married     Spouse name: Jackelyn Poling  . Number of children: 2  . Years of education: Ph.D   Occupational History  . 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 Smoking status: Never Smoker  . Smokeless tobacco: Never Used  . Alcohol use Yes     Comment: 09/11/2013 "might have a drink once/month"  . Drug use: No  . Sexual activity: Not Currently   Other Topics Concern  . Not on file   Social History Narrative   Patient lives at home with spouse.   Caffeine Use: eat a lot of chocolate, sodas, coffee and tea occasionally     Review of Systems: General: negative for chills, fever, night sweats or weight changes.  Cardiovascular: negative for chest pain, dyspnea on exertion, edema, orthopnea, palpitations, paroxysmal nocturnal dyspnea or shortness of breath Dermatological: negative for rash Respiratory: negative for cough or wheezing Urologic: negative for hematuria Abdominal: negative for nausea, vomiting, diarrhea, bright red blood per rectum, melena, or hematemesis Neurologic: negative for visual changes, syncope, or dizziness All other systems reviewed and are otherwise negative except as noted above.    Blood pressure (!) 148/70, pulse 70, height 5\' 8"  (1.727 m), weight 158 lb (71.7 kg).  General appearance: alert and no distress Neck: no adenopathy, no carotid bruit, no JVD, supple, symmetrical, trachea midline and thyroid not enlarged, symmetric, no tenderness/mass/nodules Lungs: clear to auscultation bilaterally Heart: irregularly irregular rhythm Extremities: 2/6 murmur heard best at left upper sternal border.  EKG atrial fibrillation with a ventricular response of 70. I personally reviewed this EKG.  ASSESSMENT AND PLAN:   Aortic dissection- chronic abd dissection  Chronic type A dissection followed by [name] . This occurred February 2000. Recent 2-D echo performed 01/07/17 by Dr. Jeffie Pollock  revealed increase in his ascending aortic  dimension from 5-5.9 cm all though I do not think she is a candidate for open repair.  Persistent atrial fibrillation Persistent atrial fibrillation rate controlled on Coumadin anticoagulation. We decided to transition to a nominal oral anticoagulant.  Severe tricuspid valve regurgitation Moderate to severe tricuspid regurgitation by recent 2-D echo and not thought to be a surgical candidate for repair  Lower extremity edema Probably multifactorial related to dietary indiscretion as well as TR controlled with oral diuretics.      [name] MD FACP,FACC,FAHA, [facility] 05/25/2017 4:32 PM

## 2017-05-25 NOTE — Addendum Note (Signed)
Addended by: Therisa Doyne on: 05/25/2017 04:58 PM   Modules accepted: Orders

## 2017-05-25 NOTE — Assessment & Plan Note (Signed)
Moderate to severe tricuspid regurgitation by recent 2-D echo and not thought to be a surgical candidate for repair

## 2017-05-25 NOTE — Assessment & Plan Note (Signed)
Persistent atrial fibrillation rate controlled on Coumadin anticoagulation. We decided to transition to a nominal oral anticoagulant.

## 2017-05-25 NOTE — Patient Instructions (Addendum)
Medication Instructions: Your physician recommends that you continue on your current medications as directed. Please refer to the Current Medication list given to you today.  STOP Coumadin  START Xarelto 20 mg   Follow-Up: Your physician wants you to follow-up in: 1 year with Dr. [name]. You will receive a reminder letter in the mail two months in advance. If you don't receive a letter, please call our office to schedule the follow-up appointment.

## 2017-05-25 NOTE — Assessment & Plan Note (Signed)
Chronic type A dissection followed by [name] . This occurred February 2000. Recent 2-D echo performed 01/07/17 by Dr. Jeffie Pollock  revealed increase in his ascending aortic dimension from 5-5.9 cm all though I do not think she is a candidate for open repair.

## 2017-05-26 ENCOUNTER — Encounter: Payer: Self-pay | Admitting: Cardiovascular Disease

## 2017-05-26 ENCOUNTER — Telehealth: Payer: Self-pay | Admitting: Pharmacist Clinician (PhC)/ Clinical Pharmacy Specialist

## 2017-05-26 MED ORDER — APIXABAN 5 MG PO TABS: 5.0000 mg | ORAL_TABLET | Freq: Two times a day (BID) | ORAL | 5 refills | Status: DC

## 2017-05-26 NOTE — Telephone Encounter (Signed)
See patient MyChart note.  After getting samples of Xarelto to start yesterday, patient has decided he would instead prefer Eliquis.  Took warfarin instead of Xarelto sample last night.    Eliquis dose is 5 mg bid, called to [facility].  Patient understands to hold warfarin dose today and start Eliquis tomorrow.  Will leave 30 day free card and 2 weeks of samples

## 2017-06-03 ENCOUNTER — Ambulatory Visit (HOSPITAL_COMMUNITY)
Admission: RE | Admit: 2017-06-03 | Discharge: 2017-06-03 | Disposition: A | Discharge status: Home / Self Care | Payer: Medicare Other | Source: Ambulatory Visit | Attending: Internal Medicine | Admitting: Internal Medicine

## 2017-06-03 VITALS — BP 172/80 | HR 78 | Wt 158.5 lb

## 2017-06-03 DIAGNOSIS — G35 Multiple sclerosis: Secondary | ICD-10-CM | POA: Diagnosis not present

## 2017-06-03 DIAGNOSIS — I7101 Dissection of thoracic aorta: Secondary | ICD-10-CM | POA: Diagnosis not present

## 2017-06-03 DIAGNOSIS — I110 Hypertensive heart disease with heart failure: Secondary | ICD-10-CM | POA: Diagnosis not present

## 2017-06-03 DIAGNOSIS — I480 Paroxysmal atrial fibrillation: Secondary | ICD-10-CM | POA: Diagnosis not present

## 2017-06-03 DIAGNOSIS — I50810 Right heart failure, unspecified: Secondary | ICD-10-CM | POA: Insufficient documentation

## 2017-06-03 DIAGNOSIS — I482 Chronic atrial fibrillation, unspecified: Secondary | ICD-10-CM

## 2017-06-03 DIAGNOSIS — Z7901 Long term (current) use of anticoagulants: Secondary | ICD-10-CM | POA: Insufficient documentation

## 2017-06-03 DIAGNOSIS — I071 Rheumatic tricuspid insufficiency: Secondary | ICD-10-CM | POA: Insufficient documentation

## 2017-06-03 DIAGNOSIS — I10 Essential (primary) hypertension: Secondary | ICD-10-CM | POA: Diagnosis present

## 2017-06-03 DIAGNOSIS — Z95828 Presence of other vascular implants and grafts: Secondary | ICD-10-CM | POA: Diagnosis not present

## 2017-06-03 DIAGNOSIS — Z79899 Other long term (current) drug therapy: Secondary | ICD-10-CM | POA: Diagnosis not present

## 2017-06-03 MED ORDER — CARVEDILOL 3.125 MG PO TABS
3.1250 mg | ORAL_TABLET | Freq: Two times a day (BID) | ORAL | 3 refills | Status: DC
Start: 2017-06-03 — End: 2017-12-19

## 2017-06-03 NOTE — Addendum Note (Signed)
Encounter addended by: Shirley Muscat, RN on: 06/03/2017  2:43 PM<BR>    Actions taken: Order list changed, Sign clinical note

## 2017-06-03 NOTE — Addendum Note (Signed)
Encounter addended by: Jolaine Artist, MD on: 06/03/2017  2:34 PM<BR>    Actions taken: Sign clinical note

## 2017-06-03 NOTE — Progress Notes (Addendum)
ADVANCED HEART FAILURE CLINIC NOTE  Patient ID: [name], male   DOB: 14-May-1946, 71 y.o.   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 Roxy Henry for further evaluation for possible TV repair.   He presented acutely on October 15, 1998 with acute type A aortic dissection. He underwent emergency surgical repair with hemi-arch replacement of the ascending thoracic aorta and resuspension of the native aortic valve. 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 He has been followed closely in [facility] office due to interval increase size in the patient's aortic root aneurysm as well as the aneurysm of the chronically dissected aortic arch (last seen earlier this week). Because of the patient's underlying multiple sclerosis with severe generalized weakness patient was felt to be candidate for surgical intervention. He was referred to [facility] for a second opinion and again not felt to be candidate for elective surgery  Admitted in December 71 with right heart failure including severe bilateral lower extremity edema and generalized anasarca. Diuresed well and was started on Coumadin for long-term anticoagulation because of chronic persistent atrial fibrillation.  Echo in 3/15 actually showed that tricuspid regurgitation looked more moderate; EF 55%, mild AI, aortic root at sinuses of valsalva 4.8 cm, moderate Timothy, mildly dilated RV moderate TR, PA systolic pressure 46 mmHg.   See in [name] office 4/18 and CT showed No significant interval change in aneurysmal dilatation of the aortic root, the non coronary sinus  of Valsalva, and the residual tubular and proximal transverse thoracic aorta compared to  Here for routine f/u. Doing well from HF perspective. Weight down about 6 pounds. Edema well controlled on lasix 40 bid. Every 2-3 days takes an extra dose as needed. No dizziness. Swimming 3x/week. Doing a 1/4 mile. No bleeding with Eliquis. Unfortunately, MS has been slowly progressive. [facility]. SBP typically runs 130-135.    Echo 4/18  LVEF 60-65% RV dilated with normal function. Moderate to severe TR. AoRoot dilated 6.0 cm with mild AI   Labs (4/15): K 3.7, creatinine 1.07  Current Outpatient Prescriptions on File Prior to Encounter  Medication Sig Dispense Refill  . apixaban (ELIQUIS) 5 MG TABS tablet Take 1 tablet (5 mg total) by mouth 2 (two) times daily. 60 tablet 5  . Cholecalciferol (VITAMIN D3) 2000 UNITS TABS Take 1 capsule by mouth daily.    Marland Kitchen dalfampridine (AMPYRA) 10 MG TB12 TAKE ONE TABLET (10 MG) BY MOUTH TWICE DAILY (APPROXIMATELY 12 HOURS APART). MAY BE TAKEN WITH OR WITHOUT FOOD. DO NOT CUT OR CRUSH. STORE A 60 tablet 0  . ferrous sulfate 325 (65 FE) MG tablet Take 1 tablet (325 mg total) by mouth 3 (three) times daily with meals. 90 tablet 1  . folic acid (FOLVITE) 1 MG tablet Take 1 mg by mouth daily.      . furosemide (LASIX) 40 MG tablet Take 1 tablet (40 mg total) by mouth 2 (two) times daily. May take an additional tab in the PM as needed for weight gain 270 tablet 3  . losartan (COZAAR) 100 MG tablet Take 1 tablet (100 mg total)  by mouth daily. 30 tablet 3  . NON FORMULARY Take 2 tablets by mouth daily. OTC Laxative    . QUEtiapine (SEROQUEL) 25 MG tablet Take 25 mg by mouth at bedtime as needed.     Marland Kitchen spironolactone (ALDACTONE) 25 MG tablet TAKE 1 TABLET EACH DAY. 30 tablet 2  . TECFIDERA 240 MG CPDR TAKE ONE CAPSULE (240 MG) BY MOUTH TWICE DAILY. STORE IN ORIGINAL CONTAINER AT ROOM TEMPERATURE. 180 capsule 1  . triamcinolone cream (KENALOG) 0.1 %      No current  facility-administered medications on file prior to encounter.       PHYSICAL EXAM: Vitals:   06/03/17 1356  BP: (!) 172/80  Pulse: 78  SpO2: 96%   Filed Weights   06/03/17 1356  Weight: 158 lb 8 oz (71.9 kg)    General:  Well appearing. No resp difficulty. Walks with walker HEENT: normal Neck: supple. JVP 5  Carotids 2+ bilat; no bruits. No lymphadenopathy or thryomegaly appreciated. Cor: PMI nondisplaced. IRR rate & rhythm. 2/6 TR Lungs: clear Abdomen: soft, nontender, nondistended. No hepatosplenomegaly. No bruits or masses. Good bowel sounds. Extremities: no cyanosis, clubbing, rash, edema. Brace on RLE  Neuro: alert & orientedx3, cranial nerves grossly intact. moves all 4 extremities w/o difficulty. Affect pleasant   ASSESSMENT & PLAN: 1. Right heart failure with moderate to severe TR  - Last echo 4/18 LV/RV function stable. TR remains moderate to severe - Volume status looks much better. Doing very well with sliding scale lasix. - Recent BMET with PCP ok. - He is not a candidate for a tricuspid valve operation.  2. Aortic dissection:   - s/p hemi-arch replacement of the ascending thoracic aorta and resuspension of the native aortic valve 2000  - now with chronic dissection and enlarging aneurysm involving sinus of Valsalva and aortic arch  - CTA 4/18 was stable. Continue to follow with [facility]  - Add b-blocker 3. HTN   - BP elevated here but fairly well controlled at home. Ideally would like SBP 110-130  - Will add carvedilol 3.125 bid as tolerated to help with SBP to keep sheer forces down as tolerated.  4. Chronic AF:     --rate controlled. On Eliquis 5. Severe multiple sclerosis    --continue to follow with Neuro   Iola Turri,MD 06/03/2017

## 2017-06-03 NOTE — Patient Instructions (Signed)
Start Carvedilol 3.125 mg (1 tab), twice a day  Your physician recommends that you schedule a follow-up appointment in: 6 months we will call you

## 2017-06-18 ENCOUNTER — Emergency Department (HOSPITAL_COMMUNITY)
Admission: EM | Admit: 2017-06-18 | Discharge: 2017-06-18 | Disposition: A | Discharge status: Home / Self Care | Payer: Medicare Other | Attending: Physician Assistant | Admitting: Physician Assistant

## 2017-06-18 ENCOUNTER — Emergency Department (HOSPITAL_COMMUNITY): Payer: Medicare Other

## 2017-06-18 ENCOUNTER — Encounter (HOSPITAL_COMMUNITY): Payer: Self-pay | Admitting: Emergency Medicine

## 2017-06-18 DIAGNOSIS — Z7901 Long term (current) use of anticoagulants: Secondary | ICD-10-CM | POA: Insufficient documentation

## 2017-06-18 DIAGNOSIS — Z79899 Other long term (current) drug therapy: Secondary | ICD-10-CM | POA: Diagnosis not present

## 2017-06-18 DIAGNOSIS — G35 Multiple sclerosis: Secondary | ICD-10-CM | POA: Insufficient documentation

## 2017-06-18 DIAGNOSIS — I7101 Dissection of thoracic aorta: Secondary | ICD-10-CM | POA: Diagnosis not present

## 2017-06-18 DIAGNOSIS — R42 Dizziness and giddiness: Secondary | ICD-10-CM | POA: Insufficient documentation

## 2017-06-18 DIAGNOSIS — I110 Hypertensive heart disease with heart failure: Secondary | ICD-10-CM | POA: Diagnosis not present

## 2017-06-18 DIAGNOSIS — I5032 Chronic diastolic (congestive) heart failure: Secondary | ICD-10-CM | POA: Insufficient documentation

## 2017-06-18 LAB — URINALYSIS, ROUTINE W REFLEX MICROSCOPIC
Bilirubin Urine: NEGATIVE
Glucose, UA: NEGATIVE mg/dL
Hgb urine dipstick: NEGATIVE
Ketones, ur: NEGATIVE mg/dL
Leukocytes, UA: NEGATIVE
Nitrite: NEGATIVE
Protein, ur: NEGATIVE mg/dL
Specific Gravity, Urine: 1.014 (ref 1.005–1.030)
pH: 5 (ref 5.0–8.0)

## 2017-06-18 LAB — COMPREHENSIVE METABOLIC PANEL
ALT: 20 U/L (ref 17–63)
AST: 23 U/L (ref 15–41)
Albumin: 3.7 g/dL (ref 3.5–5.0)
Alkaline Phosphatase: 96 U/L (ref 38–126)
Anion gap: 9 (ref 5–15)
BUN: 39 mg/dL — ABNORMAL HIGH (ref 6–20)
CO2: 23 mmol/L (ref 22–32)
Calcium: 9.1 mg/dL (ref 8.9–10.3)
Chloride: 108 mmol/L (ref 101–111)
Creatinine, Ser: 1.53 mg/dL — ABNORMAL HIGH (ref 0.61–1.24)
GFR calc Af Amer: 51 mL/min — ABNORMAL LOW (ref >60)
GFR calc non Af Amer: 44 mL/min — ABNORMAL LOW (ref >60)
Glucose, Bld: 105 mg/dL — ABNORMAL HIGH (ref 65–99)
Potassium: 3.6 mmol/L (ref 3.5–5.1)
Sodium: 140 mmol/L (ref 135–145)
Total Bilirubin: 0.6 mg/dL (ref 0.3–1.2)
Total Protein: 6.2 g/dL — ABNORMAL LOW (ref 6.5–8.1)

## 2017-06-18 LAB — CBC WITH DIFFERENTIAL/PLATELET
Basophils Absolute: 0 10*3/uL (ref 0.0–0.1)
Basophils Relative: 1 %
Eosinophils Absolute: 0.1 10*3/uL (ref 0.0–0.7)
Eosinophils Relative: 2 %
HCT: 34.4 % — ABNORMAL LOW (ref 39.0–52.0)
Hemoglobin: 11.2 g/dL — ABNORMAL LOW (ref 13.0–17.0)
Lymphocytes Relative: 9 %
Lymphs Abs: 0.5 10*3/uL — ABNORMAL LOW (ref 0.7–4.0)
MCH: 28.6 pg (ref 26.0–34.0)
MCHC: 32.6 g/dL (ref 30.0–36.0)
MCV: 87.8 fL (ref 78.0–100.0)
Monocytes Absolute: 0.1 10*3/uL (ref 0.1–1.0)
Monocytes Relative: 2 %
Neutro Abs: 4.9 10*3/uL (ref 1.7–7.7)
Neutrophils Relative %: 87 %
Platelets: 97 10*3/uL — ABNORMAL LOW (ref 150–400)
RBC: 3.92 MIL/uL — ABNORMAL LOW (ref 4.22–5.81)
RDW: 13.5 % (ref 11.5–15.5)
WBC: 5.7 10*3/uL (ref 4.0–10.5)

## 2017-06-18 LAB — I-STAT TROPONIN, ED: Troponin i, poc: 0 ng/mL (ref 0.00–0.08)

## 2017-06-18 LAB — I-STAT CG4 LACTIC ACID, ED: Lactic Acid, Venous: 1.1 mmol/L (ref 0.5–1.9)

## 2017-06-18 LAB — BRAIN NATRIURETIC PEPTIDE: B Natriuretic Peptide: 141 pg/mL — ABNORMAL HIGH (ref 0.0–100.0)

## 2017-06-18 MED ORDER — SODIUM CHLORIDE 0.9 % IV BOLUS (SEPSIS)
500.0000 mL | Freq: Once | INTRAVENOUS | Status: AC
  Administered 2017-06-18: 500 mL via INTRAVENOUS

## 2017-06-18 NOTE — ED Notes (Signed)
Patient transported to X-ray 

## 2017-06-18 NOTE — ED Provider Notes (Signed)
[place] DEPT Provider Note   CSN: [id number] Arrival date & time: 06/18/17  1526     History   Chief Complaint Chief Complaint  Patient presents with  . Dizziness    HPI [name] is a 71 y.o. male.  HPI   Patient's a 71 year old male presenting with multiple sclerosis, Type A aortic dissection 2000, right heart failure with severe TR and chronic AF, chronically dissected aortic arch (AoRoot dilated 6.0 cm with mild AI). He is presenting today with feelings of presyncope, dizziness. Patient was playing bridge with his wife and a friend and began to felt very ill, shortness of breath/dizzy.  Patient is followed by [name].   He does report diarrhea last night and continues take his diuretics. He thinks that he is most likely dehydrated.  Past Medical History:  Diagnosis Date  . Anemia   . Aneurysm of aortic arch ([place]) 04/04/2012   Chronic aneurysmal dilatation of aortic arch with chronic type A aortic dissection, s/p replacement of ascending thoracic aorta  . Aortic dissection ([place]) 11/05/1998   S/P emergency repair of acute type A aortic dissection with resuspension of native aortic valve  . Aortic dissection, thoracic ([place])   . Aortic root aneurysm ([place])   . Atrial fibrillation ([place]) 01/30/2013   Atrial fibrillation   . Bilateral lower extremity edema   . BPH (benign prostatic hyperplasia)   . CHF (congestive heart failure) ([place]) September [birth year], 1947   2D Echo - EF 50-55%, mild-moderately dilated right ventricle, mild-moderate tricuspid valve regurgitation, moderately dilated right atrium  . Depression   . Dyslipidemia   . Exertional shortness of breath    "for awhile now" (09/11/2013)  . Hemorrhoids   . History of blood transfusion   . Hypertension   . Mitral regurgitation 02/20/2013  . Multiple sclerosis ([place])   . Neuromuscular disorder ([place])   . Pleural effusion, right, large 01/30/2013  . Severe tricuspid valve regurgitation 01/30/2013   Severe tricuspid  regurg by recent 2-D echo with a dilated tricuspid annulus, moderate pulmonary hypertension and biatrial enlargement   . Thrombocytopenia [facility])     Patient Active Problem List   Diagnosis Date Noted  . Aortic root aneurysm ([place])   . Aneurysm of aortic arch ([place]) 12/31/2014  . Tricuspid regurgitation 10/08/2014  . RVF (right ventricular failure) ([place]) 10/08/2014  . Chronic diastolic congestive heart failure ([place]) 10/09/2013  . Sinus pause 09/16/2013  . Anemia due to blood loss, acute in setting of supratheraputic INR 09/11/13 09/12/2013  . 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 Acute renal insufficiency 09/12/2013  . Anasarca 09/12/2013  . Anemia 09/12/2013  . Bilateral leg edema 09/11/2013  . Acute right-sided congestive heart failure ([place]) 09/11/2013  . Unstable gait 09/11/2013  . Long term (current) use of anticoagulants 04/11/2013  . Dissection of aorta, thoracic- surgery Feb 2000 02/20/2013  . Mitral regurgitation 02/20/2013  . Lower extremity edema 02/20/2013  . Persistent atrial fibrillation ([place]) 01/30/2013  . Volume overload 01/30/2013  . Severe tricuspid valve regurgitation 01/30/2013  . Pleural effusion, right, large 01/30/2013  . MS (multiple sclerosis) ([place]) 01/13/2013  . Thrombocytopenia ([place]) 07/26/2011  . Aortic dissection- chronic abd dissection  11/05/1998    Past Surgical History:  Procedure Laterality Date  . HEMORRHOID SURGERY  2009   Internal, external hemorrhoidectomy, general anesthesia,prone position. [Other]  . Open reduction and internal fixation of right distal radius fracture using [employer] distal radius volar locking plate.  07/2005   Dr  Ninfa Linden  . Repair of acute type A aortic dissection with resuspension of native aortic valve  10/15/1998   Dr Roxy Manns  . TEE WITHOUT CARDIOVERSION N/A 02/17/2013   Procedure: TRANSESOPHAGEAL ECHOCARDIOGRAM (TEE);  Surgeon: [name], MD;  Location: [facility] ENDOSCOPY;  Service:  Cardiovascular;  Laterality: N/A;       Home Medications    Prior to Admission medications   Medication Sig Start Date End Date Taking? Authorizing Provider  apixaban (ELIQUIS) 5 MG TABS tablet Take 1 tablet (5 mg total) by mouth 2 (two) times daily. 05/26/17   [name], MD  carvedilol (COREG) 3.125 MG tablet Take 1 tablet (3.125 mg total) by mouth 2 (two) times daily. 06/03/17 09/01/17  Bensimhon, Shaune Pascal, MD  Cholecalciferol (VITAMIN D3) 2000 UNITS TABS Take 1 capsule by mouth daily.    [provider]  dalfampridine (AMPYRA) 10 MG TB12 TAKE ONE TABLET (10 MG) BY MOUTH TWICE DAILY (APPROXIMATELY 12 HOURS APART). MAY BE TAKEN WITH OR WITHOUT FOOD. DO NOT CUT OR CRUSH. STORE A 05/12/17   Penumalli, Earlean Polka, MD  ferrous sulfate 325 (65 FE) MG tablet Take 1 tablet (325 mg total) by mouth 3 (three) times daily with meals. 09/18/13   Caren Griffins, MD  folic acid (FOLVITE) 1 MG tablet Take 1 mg by mouth daily.      [provider]  furosemide (LASIX) 40 MG tablet Take 1 tablet (40 mg total) by mouth 2 (two) times daily. May take an additional tab in the PM as needed for weight gain 01/14/17   Bensimhon, Shaune Pascal, MD  losartan (COZAAR) 100 MG tablet Take 1 tablet (100 mg total) by mouth daily. 01/07/17   Bensimhon, Shaune Pascal, MD  NON FORMULARY Take 2 tablets by mouth daily. OTC Laxative    [provider]  QUEtiapine (SEROQUEL) 25 MG tablet Take 25 mg by mouth at bedtime as needed.     [provider]  spironolactone (ALDACTONE) 25 MG tablet TAKE 1 TABLET EACH DAY. 05/06/17   Bensimhon, Shaune Pascal, MD  TECFIDERA 240 MG CPDR TAKE ONE CAPSULE (240 MG) BY MOUTH TWICE DAILY. STORE IN ORIGINAL CONTAINER AT ROOM TEMPERATURE. 12/16/16   Penumalli, Earlean Polka, MD  triamcinolone cream (KENALOG) 0.1 %  11/06/14   [provider]    Family History Family History  Problem Relation Age of Onset  . Thyroid cancer Father        smoked  . Heart attack Mother   .  Lung cancer Brother        smoked  . Emphysema Brother        smoked    Social History Social History  Substance Use Topics  . Smoking status: Never Smoker  . Smokeless tobacco: Never Used  . Alcohol use Yes     Comment: 09/11/2013 "might have a drink once/month"     Allergies   Patient has no known allergies.   Review of Systems Review of Systems  Constitutional: Negative for activity change.  Respiratory: Positive for shortness of breath.   Cardiovascular: Negative for chest pain.  Gastrointestinal: Negative for abdominal pain.  Neurological: Positive for syncope.  All other systems reviewed and are negative.    Physical Exam Updated Vital Signs BP (!) 104/51   Pulse (!) 53   Temp 97.8 F (36.6 C) (Oral)   Resp 16   Ht 5\' 9"  (1.753 m)   Wt 68 kg (150 lb)   SpO2 100%   BMI  22.15 kg/m   Physical Exam  Constitutional: He is oriented to person, place, and time. He appears well-nourished.  HENT:  Head: Normocephalic.  Eyes: Conjunctivae are normal.  Cardiovascular:  Irregularly ir irregular, slow.   Pulmonary/Chest: Effort normal and breath sounds normal. No respiratory distress.  Neurological: He is oriented to person, place, and time.  Skin: Skin is warm and dry. He is not diaphoretic.  Psychiatric: He has a normal mood and affect. His behavior is normal.     ED Treatments / Results  Labs (all labs ordered are listed, but only abnormal results are displayed) Labs Reviewed  COMPREHENSIVE METABOLIC PANEL  CBC WITH DIFFERENTIAL/PLATELET  BRAIN NATRIURETIC PEPTIDE  URINALYSIS, ROUTINE W REFLEX MICROSCOPIC  I-STAT TROPONIN, ED  I-STAT CG4 LACTIC ACID, ED    EKG  EKG Interpretation None       Radiology No results found.  Procedures Procedures (including critical care time)  Medications Ordered in ED Medications - No data to display   Initial Impression / Assessment and Plan / ED Course  I have reviewed the triage vital signs and the  nursing notes.  Pertinent labs & imaging results that were available during my care of the patient were reviewed by me and considered in my medical decision making (see chart for details).      Patient's a 71 year old male presenting with multiple sclerosis, Type A aortic dissection 2000, right heart failure with severe TR and chronic AF, chronically dissected aortic arch (Aortic Root dilated 6.0 cm with mild AI). He is presenting today with feelings of presyncope, dizziness. Patient was playing bridge with his wife and a friend and began to felt very ill, shortness of breath/dizzy.  Patient is followed by [name].   He does report diarrhea last night and continues take his diuretics. He thinks that he is most likely dehydrated.   6:14 PM  He still feels very weak. Patient HR is between 42-69, slow afib. Could be that his beta blockade is too strong and with these low HR, not enough hlow. . Unclear whether this is secondart to worsening TR, or worsenign aortic root diltation?  7:14 PM Discussed with Dr. Debara Pickett from cardiology, he recommends 500 mL fluid and then stopping the new carvedilol that just got added by Bensimohn.   Patient ambulated without issue and we will have him follow up Monday with Bensimohn.   Final Clinical Impressions(s) / ED Diagnoses   Final diagnoses:  None    New Prescriptions New Prescriptions   No medications on file     Macarthur Critchley, MD 06/18/17 2316

## 2017-06-18 NOTE — ED Notes (Signed)
Pt walked per normal with brace and walker.

## 2017-06-18 NOTE — ED Triage Notes (Signed)
Per [facility]. Pt is coming from church where he was sitting in a chair and suddenly got dizzy. No LOC was noted. Pt stated that he has diarrhea since yesterday and is dehydrated. Pt stated that he took his lasix last night and this morning.   BP 80/40 sitting  CBG 102

## 2017-06-18 NOTE — Discharge Instructions (Signed)
It is unclear what caused her symptoms today. It could be that you are on your diuretic and also had diarrhea. Please be sure to drink plenty of fluids when having diarrhea. In addition we'll have you stop the new medication that you just started, carvedilol. We will follow up with Dr. Sung Amabile in  next 2-3 days if possible.

## 2017-06-21 ENCOUNTER — Telehealth (HOSPITAL_COMMUNITY): Payer: Self-pay

## 2017-06-21 ENCOUNTER — Other Ambulatory Visit (HOSPITAL_COMMUNITY): Payer: Self-pay | Admitting: Internal Medicine

## 2017-06-21 NOTE — Telephone Encounter (Signed)
Pt went to the ED on 10/5 for dizziness. HR was 42-69 in a-fib according to note. They stopped Carvedilol. This med was started on last visit 9/20 3.125 mg x2 day.  Spoke with Pt states dizziness has improved will send to MD for further review

## 2017-06-21 NOTE — Telephone Encounter (Signed)
Pt is aware and agreeable

## 2017-06-21 NOTE — Telephone Encounter (Signed)
Agree with holding carvedilol

## 2017-07-23 ENCOUNTER — Other Ambulatory Visit: Payer: Self-pay | Admitting: Diagnostic Neuroimaging

## 2017-07-23 ENCOUNTER — Encounter: Payer: Self-pay | Admitting: Diagnostic Neuroimaging

## 2017-07-26 ENCOUNTER — Other Ambulatory Visit: Payer: Self-pay | Admitting: Diagnostic Neuroimaging

## 2017-07-26 MED ORDER — DALFAMPRIDINE ER 10 MG PO TB12: ORAL_TABLET | ORAL | 0 refills | Status: DC

## 2017-07-27 ENCOUNTER — Telehealth: Payer: Self-pay | Admitting: Diagnostic Neuroimaging

## 2017-07-27 NOTE — Telephone Encounter (Signed)
Pt called stating that he was returning the call to [facility], he is asking for a call back please.

## 2017-07-27 NOTE — Telephone Encounter (Signed)
I spoke to pt and he needed a 30 day supply for his trip and he was able [facility].  I received refill request for tecfidera and his ampyra as well and did renew for one year.  Pt has appt on 08-18-17 with [name].

## 2017-08-18 ENCOUNTER — Ambulatory Visit (INDEPENDENT_AMBULATORY_CARE_PROVIDER_SITE_OTHER): Payer: Medicare Other | Admitting: Diagnostic Neuroimaging

## 2017-08-18 ENCOUNTER — Encounter: Payer: Self-pay | Admitting: Diagnostic Neuroimaging

## 2017-08-18 VITALS — BP 146/74 | HR 88 | Wt 166.0 lb

## 2017-08-18 DIAGNOSIS — R269 Unspecified abnormalities of gait and mobility: Secondary | ICD-10-CM

## 2017-08-18 DIAGNOSIS — G35 Multiple sclerosis: Secondary | ICD-10-CM | POA: Diagnosis not present

## 2017-08-18 DIAGNOSIS — G35D Multiple sclerosis, unspecified: Secondary | ICD-10-CM

## 2017-08-18 NOTE — Progress Notes (Signed)
PATIENT: [name] DOB: 07-14-1946   REASON FOR VISIT: follow up for MS HISTORY FROM: patient and wife  Chief Complaint  Patient presents with  . Multiple Sclerosis    rm 7, wife- [name], "Amprya helping with walking; MS still progressing, leaning on walker more"   . Follow-up    1 year     HISTORY OF PRESENT ILLNESS:  UPDATE (08/18/17, VRP): Since last visit, tolerating meds. No alleviating or aggravating factors. Ampyra helping somewhat. Having to lean on walker more.   UPDATE 08/18/16: Since last visit, tolerating meds. No new issues. Some slight subjective progression of symptoms. Had a fall recently with subsequent right forearm bruising.   UPDATE 02/11/16: Since last visit, tolerating tecfidera. Some slow progression. Asking about new MS medications.  UPDATE 08/13/15: Tolerating meds. More urinary issues. Using walker mainly.   UPDATE 04/16/15: Since last visit, patient is stable. Getting home visits with palliative care.   UPDATE 02/05/15: Discussed palliative care consult and options.   UPDATE 08/02/14: Since last visit, feels stable. Tolerating tecfidera and ampyra. No new events. Uses cane and walker.  Swimming more. Wife notes memory issues slightly worse.   UPDATE 01/31/14: Since last visit, continues on tecfidera. No progression of neuro sxs or MS sxs. Asking about driving again. Has not driven in last 3-4 months. Ongoing medical mgmt of his cardiac issues, and he is not a surgical candidate.  UPDATE 10/25/13 (LL): Patient comes in for revisit, since last visit he had worsening right-sided HF and was hospitalized.  He was seen by [name] for surgical consultation to consider tricuspid valve repair and possible Maze procedure.  He was referred to the advanced heart failure clinic who has been adjusting his lasix to manage his lower extremity edema and shortness of breath.  He is to be seen by [name] again for follow up in another month, and says Dr. Haroldine Henry  felt that surgery was needed.  He comes in today to discuss with [name] how his MS would be affected by having the surgery.  His wife feels like he is having more difficulty walking since last visit, but he has not had any falls.  He is tolerating Tecfidera and Ampyra well.  UPDATE 07/19/13 (VP): Patient continues to have progressive deterioration in cognitive ability, memory, mood lability, gait stability. Patient is on tecfidera now, and tolerating without side effects. Patient feels when he came off of Betaseron he had some accelerated progression of MS that this has slowed down since starting tecfidera. Unclear whether patient's current level of functioning is on the same trajectory as the decline that was noted while patient was on Betaseron, or whether there has been some acceleration of deterioration. Other factors include some marital discord and comorbid depression. Other contributing factors include his cardiac issues including atrial fibrillation, on Coumadin, as well as severe tricuspid valve regurgitation leading to peripheral edema. Unfortunately patient is not felt to be a good surgical candidate.   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 The patient is accompanied by his wife. Pt. States he is doing worse since his last visit in Feb. He is walking slower and having more difficulty standing from a seated position. 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 [name] had talked to him about changing MS medications and he thinks he wants to. Having more memory problems. Continuing on betaseron right now.   PRIOR  HPI (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 with a history of multiple sclerosis diagnosed in September 1996. He has been on Betaseron since 1996 and has right leg spasticity requiring him to use a  cane in his left hand.He does not have any significant side effects from the Betaseron. Blood studies 02/10/2012 are normal CBC and CMP except for a low platelet count of 63K, Hgb 12.1 and alk Phos of 253. This is being followed by [name] and [name]. He has never had lymphopenia or neutropenia. There is no change in his symptoms. He denies bowel or bladder incontinence, weakness, Lhermitte's sign, or double vision. He swims 3 times per week for 10 laps which is a little over a quarter of a mile, claims he is winded afterwards. He has not fallen, uses a cane in his left hand. MRI of the brain and cervical spine with and without contrast enhancement 03/26/2010 showed multiple subcortical, periventricular, and brainstem white matter hyperintensities without enhancing lesions present. There were T1 black holes and atrophy present. The cervical MRI showed a large central disc protrusion at C5-6 and spinal cord hyperintensities at C4 and brain stem representing remote demyelinating plaques without enhancing lesions present. Ampyra was started November 2012 with improvement in gait. He was pleased with the addition of the drug and his response. Pt denies injection site problems however he says he is tired of shots. He has been on [employer] since 1996 and we have talked about whether changing to an oral agent should be considered. He had several falls with his right leg giving way on standing. He has right knee pain. He states his memory is worse. He denies numbness delusions or depression. 07/11/2012= (MMSE29/30. Clock drawing task4/4. Animal fluency test 17). His wife has noted more problems walking which the patient is hesitant to discuss. He does admit since calling me for a work in appointment , that he believes he is having more difficulty walking. He uses a walker at home and is noticeably slower. He uses the walker to get out of a bed that is low and out of a chair. He notices shortness of breath on swimming.  His swallowing is okay except for occasional difficulty with liquids. He has right knee pain when walking. Betaseron seems to help his knee pain, walking, and his eyes after an injection. He is hesitant to go off of the medication for 3 weeks. He has increased spasms in his right foot and leg that are also moving to the left leg. He has swelling in his legs treated with furosemide.    REVIEW OF SYSTEMS: Full 14 system review of systems performed and notable only as per HPI.   ALLERGIES: No Known Allergies  HOME MEDICATIONS: Outpatient Medications Prior to Visit  Medication Sig Dispense Refill  . apixaban (ELIQUIS) 5 MG TABS tablet Take 1 tablet (5 mg total) by mouth 2 (two) times daily. 60 tablet 5  . Cholecalciferol (VITAMIN D3) 2000 UNITS TABS Take 1 capsule by mouth daily.    Marland Kitchen dalfampridine 10 MG TB12 TAKE ONE TABLET BY MOUTH TWICE DAILY (APPROXIMATELY 12 HOURS APART). MAY BE TAKEN WITH OR WITHOUT FOOD. DO NOT CUT OR CRUSH. STORE AT ROOM T 180 tablet 4  . ferrous sulfate 325 (65 FE) MG tablet Take 1 tablet (325 mg total) by mouth 3 (three) times daily with meals. 90 tablet 1  . folic acid (FOLVITE) 1  MG tablet Take 1 mg by mouth daily.      . furosemide (LASIX) 40 MG tablet Take 1 tablet (40 mg total) by mouth 2 (two) times daily. May take an additional tab in the PM as needed for weight gain 270 tablet 3  . losartan (COZAAR) 100 MG tablet TAKE 1 TABLET ONCE DAILY. 30 tablet 3  . NON FORMULARY Take 2 tablets by mouth daily. OTC Laxative    . QUEtiapine (SEROQUEL) 25 MG tablet Take 25 mg by mouth at bedtime as needed.     Marland Kitchen spironolactone (ALDACTONE) 25 MG tablet TAKE 1 TABLET EACH DAY. 30 tablet 2  . TECFIDERA 240 MG CPDR TAKE ONE CAPSULE (240 MG) BY MOUTH TWICE DAILY. STORE IN ORIGINAL CONTAINER AT ROOM TEMPERATURE. 180 capsule 4  . triamcinolone cream (KENALOG) 0.1 %     . carvedilol (COREG) 3.125 MG tablet Take 1 tablet (3.125 mg total) by mouth 2 (two) times daily. (Patient not  taking: Reported on 08/18/2017) 60 tablet 3   No facility-administered medications prior to visit.     PAST MEDICAL HISTORY: Past Medical History:  Diagnosis Date  . Anemia   . Aneurysm of aortic arch ([place]) 04/04/2012   Chronic aneurysmal dilatation of aortic arch with chronic type A aortic dissection, s/p replacement of ascending thoracic aorta  . Aortic dissection ([place]) 11/05/1998   S/P emergency repair of acute type A aortic dissection with resuspension of native aortic valve  . Aortic dissection, thoracic ([place])   . Aortic root aneurysm ([place])   . Atrial fibrillation ([place]) 01/30/2013   Atrial fibrillation   . Bilateral lower extremity edema   . BPH (benign prostatic hyperplasia)   . CHF (congestive heart failure) ([place]) Mar 17, 1946   2D Echo - EF 50-55%, mild-moderately dilated right ventricle, mild-moderate tricuspid valve regurgitation, moderately dilated right atrium  . Depression   . Dyslipidemia   . Exertional shortness of breath    "for awhile now" (09/11/2013)  . Hemorrhoids   . History of blood transfusion   . Hypertension   . Mitral regurgitation 02/20/2013  . Multiple sclerosis ([place])   . Neuromuscular disorder ([place])   . Pleural effusion, right, large 01/30/2013  . Severe tricuspid valve regurgitation 01/30/2013   Severe tricuspid regurg by recent 2-D echo with a dilated tricuspid annulus, moderate pulmonary hypertension and biatrial enlargement   . Thrombocytopenia ([place])     PAST SURGICAL HISTORY: Past Surgical History:  Procedure Laterality Date  . HEMORRHOID SURGERY  2009   Internal, external hemorrhoidectomy, general anesthesia,prone position. [Other]  . Open reduction and internal fixation of right distal radius fracture using [employer] distal radius volar locking plate.  07/2005   Dr Ninfa Linden  . Repair of acute type A aortic dissection with resuspension of native aortic valve  10/15/1998   Dr Roxy Henry  . TEE WITHOUT CARDIOVERSION N/A 02/17/2013   Procedure:  TRANSESOPHAGEAL ECHOCARDIOGRAM (TEE);  Surgeon: [name], MD;  Location: [facility] ENDOSCOPY;  Service: Cardiovascular;  Laterality: N/A;    FAMILY HISTORY: Family History  Problem Relation Age of Onset  . Thyroid cancer Father        smoked  . Heart attack Mother   . Lung cancer Brother        smoked  . Emphysema Brother        smoked    SOCIAL HISTORY: Social History   Socioeconomic History  . Marital status: Married    Spouse name: Jackelyn Poling  . Number of children: 2  .  Years of education: Ph.D  . Highest education level: Not on file  Social Needs  . Financial resource strain: Not on file  . Food insecurity - worry: Not on file  . Food insecurity - inability: Not on file  . Transportation needs - medical: Not on file  . Transportation needs - non-medical: Not on file  Occupational History  . Occupation: Retired Professor    Fish farm manager: [employer]    Comment: Statistics  Tobacco Use  . Smoking status: Never Smoker  . Smokeless tobacco: Never Used  Substance and Sexual Activity  . Alcohol use: Yes    Comment: 09/11/2013 "might have a drink once/month"  . Drug use: No  . Sexual activity: Not Currently  Other Topics Concern  . Not on file  Social History Narrative   Patient lives at home with spouse.   Caffeine Use: eat a lot of chocolate, sodas, coffee and tea occasionally     PHYSICAL EXAM  Vitals:   08/18/17 1403  BP: (!) 146/74  Pulse: 88  Weight: 166 lb (75.3 kg)   Body mass index is 24.51 kg/m.  Generalized: Well developed, in no acute distress  Neck: Supple, no carotid bruits  Cardiac: REG RATE RHYTHM, SYS MURMUR  Neurological examination  MENTAL STATUS: awake, alert, language fluent, comprehension intact, naming intact  CRANIAL NERVE: pupils equal and reactive to light, visual fields full to confrontation, extraocular muscles: SACCADIC BREAKDOWN OF SMOOTH PURSUIT; END GAZE NYSTAGMUS; PAST POINTING ON SACCADES. Facial sensation and WITH DECR  RIGHT EYE CLOSURE WEAKNESS AND RIGHT LOWER FACIAL STRENGTH. SYNKINESES ON RIGHT FACE. Uvula midline, shoulder shrug symmetric, tongue midline.  MOTOR: INCREASED TONE IN BLE > BUE (RIGHT > LEFT). BUE (DELTOID 4+, TRICEP 4, BICEP 5, GRIP 4+), RIGHT LEG (HF 3, KE 3, KF 2, DF 1-2), LEFT LEG (HF 3, KE/KF 3, DF 3) SENSORY: DECR IN RLE TO ALL MODALITIES.  COORDINATION: finger-nose-finger, fine finger movements normal  REFLEXES: BUE 3 (R>L), RLE 3, LLE 2.  GAIT/STATION: DIFF STANDING FROM CHAIR. USES A WALKER. SPASTIC GAIT. RIGHT CIRCUMDUCTION. UNSTEADY.   DIAGNOSTIC DATA (LABS, IMAGING, TESTING) - I reviewed patient records, labs, notes, testing and imaging myself where available.  Lab Results  Component Value Date   WBC 5.7 06/18/2017   HGB 11.2 (L) 06/18/2017   HCT 34.4 (L) 06/18/2017   MCV 87.8 06/18/2017   PLT 97 (L) 06/18/2017   CMP Latest Ref Rng & Units 06/18/2017 02/04/2017 01/14/2017  Glucose 65 - 99 mg/dL 105(H) 95 103(H)  BUN 6 - 20 mg/dL 39(H) 40(H) 34(H)  Creatinine 0.61 - 1.24 mg/dL 1.53(H) 1.44(H) 1.35(H)  Sodium 135 - 145 mmol/L 140 141 140  Potassium 3.5 - 5.1 mmol/L 3.6 4.1 4.4  Chloride 101 - 111 mmol/L 108 105 105  CO2 22 - 32 mmol/L '23 26 28  ' Calcium 8.9 - 10.3 mg/dL 9.1 9.8 9.5  Total Protein 6.5 - 8.1 g/dL 6.2(L) - -  Total Bilirubin 0.3 - 1.2 mg/dL 0.6 - -  Alkaline Phos 38 - 126 U/L 96 - -  AST 15 - 41 U/L 23 - -  ALT 17 - 63 U/L 20 - -   lymph#  Date Value Ref Range Status  02/10/2012 0.9 0.9 - 3.3 10e3/uL Final  08/10/2011 1.1 0.9 - 3.3 10e3/uL Final  03/12/2010 0.8 (L) 0.9 - 3.3 10e3/uL Final   Lymphocytes Absolute  Date Value Ref Range Status  02/11/2016 0.5 (L) 0.7 - 3.1 x10E3/uL Final  08/02/2014  0.5 (L) 0.7 - 3.1 x10E3/uL Final  01/31/2014 0.3 (L) 0.7 - 3.1 x10E3/uL Final   Lymphs Abs  Date Value Ref Range Status  06/18/2017 0.5 (L) 0.7 - 4.0 K/uL Final  02/08/2015 0.5 (L) 0.7 - 4.0 K/uL Final    11/16/12 MRI brain - There are multiple  periventricular and subcortical and pontine chronic demyelinating plaques. There are several T1 black holes. Mild diffuse atrophy. No acute plaques. In comparison to MRI from 03/26/10, there is no significant change.   08/02/13 MRI brain - multiple brainstem, periventricular, corpus callosal and subcortical white matter hyperintensities compatible with chronic lesions of multiple sclerosis. No enhancing lesions are noted. The presence of T1 black holes and significant atrophy of corpus callosum and cortex indicates chronic disease.  11/16/12 MRI cervical spine - Multiple chronic demyelinating plaques at C2-3, C3-4 and C5-6. No acute plaques. At C5-6: Disc bulging with facet hypertrophy with moderate spinal stenosis, severe right and moderate left foraminal stenosis. At C3-4, C4-5: Disc bulging with mild spinal stenosis and biforaminal stenosis. In comparison to MRI from 03/26/10 there is no significant change.   01/20/13 JCV antibody - positive, index 3.37 (H)    ASSESSMENT AND PLAN  71 y.o. old male with Hypertension; Anemia; Depression; Multiple sclerosis; Thrombocytopenia; Dyslipidemia; Aortic dissection, thoracic; Aortic dissection (11/05/1998); Aneurysm of aortic arch (04/04/2012); Chest pain (11/18/2009); CHF (congestive heart failure), Atrial fibrillation (01/30/2013); Pleural effusion, right, large (01/30/2013); Severe tricuspid valve regurgitation (01/30/2013); Mitral regurgitation (02/20/2013); and Lower extremity edema (02/20/2013) here with multiple sclerosis since 1996. Initially on betaseron, now on tecfidera.   Dx:  MS (multiple sclerosis) ([place])  Abnormality of gait    PLAN:  MULTIPLE SCLEROSIS - continue Tecfidera and Ampyra - continue palliative care - CBC, CMP labs per PCP (every 6 months) - continue swimming and PT exercises - high fall risk and on anti-coagulation for atrial fibrillation; precautions reviewed with patient  Return in about 1 year (around 08/18/2018) for with NP/PA or  [place].     Penni Bombard, MD 98/0/6078, 9:50 PM Certified in Neurology, Neurophysiology and Neuroimaging  [facility] [address], [place], [place] [address] [phone]

## 2017-08-31 ENCOUNTER — Telehealth: Payer: Self-pay | Admitting: Diagnostic Neuroimaging

## 2017-08-31 NOTE — Telephone Encounter (Signed)
These sound medical or cardiac (lightheaded, dizzy, presyncope). He should monitor BP and check with medical doctor. I don't think it is related to ampyra, but he can stop it if he is concerned.  -VRP

## 2017-08-31 NOTE — Telephone Encounter (Signed)
Called patient and advised him, per Dr [name] to contact his PCP. These events are likely medical or cardiac. Advised he may stop Ampyra if he thinks it is problem, however Dr [name] does not think Ampyra is causing his problems. Patient stated he does not want to stop Ampyra, verbalized understanding, agreement of call.

## 2017-08-31 NOTE — Telephone Encounter (Addendum)
Spoke with patient who stated he didn't take tecfidera and ampyra on time one day, but he took them about 6 hrs later. The next day he took them on his normal schedule. Three days later he stated he suddenly "saw a flash in his eyes". He  fell and couldn't get up. 911 was called and checked him for possible stroke. He stated he did not go to the hospital. He stated 3 days later which was last night he became dizzy and fell down again. Later he was in the bathroom, felt dizzy and fell. He has stayed in bed today for fear of falling. He stated he's not had any medication changes by other providers. He stated he does check his BP occassionally but "it's always okay".  He is concerned that his falls may be due to [place].  This RN advised will discuss with Dr [name] and call him later today with reply, recommendations. Patient verbalized understanding, appreciation.

## 2017-08-31 NOTE — Telephone Encounter (Signed)
Pt called back, said he noticed his heart is beating harder, p90. BP is 150/80 a few moments ago.

## 2017-08-31 NOTE — Telephone Encounter (Signed)
Pt called back again to speak with RN. Please call asap

## 2017-08-31 NOTE — Telephone Encounter (Signed)
Pt is asking for a call re: his having 3 falls in the last 3 days.  Pt states that the falls are as a result of him mistakenly taking the pills out of sequence.  Please call

## 2017-10-17 ENCOUNTER — Other Ambulatory Visit: Payer: Self-pay | Admitting: Cardiology

## 2017-11-04 ENCOUNTER — Telehealth: Payer: Self-pay | Admitting: *Deleted

## 2017-11-04 NOTE — Telephone Encounter (Signed)
Faxed [facility] questionnaire for PA for Ampyra.

## 2017-11-05 NOTE — Telephone Encounter (Signed)
[facility] : Ampyra approved 11/04/17 through 11/04/18. PA# [employer]  [phone]

## 2017-11-15 ENCOUNTER — Telehealth: Payer: Self-pay | Admitting: *Deleted

## 2017-11-15 NOTE — Telephone Encounter (Signed)
Shipments of Tecfidera, Amp[facility]ra [facility], on 11/09/17.

## 2017-12-04 IMAGING — DX DG CHEST 2V
3 series · 3 of 3 positions shown · non-contrast
Comparison: Chest CT 01/04/2017

CLINICAL DATA: Chest pain

EXAM:
CHEST  2 VIEW

[chest lat (1 of 2)]
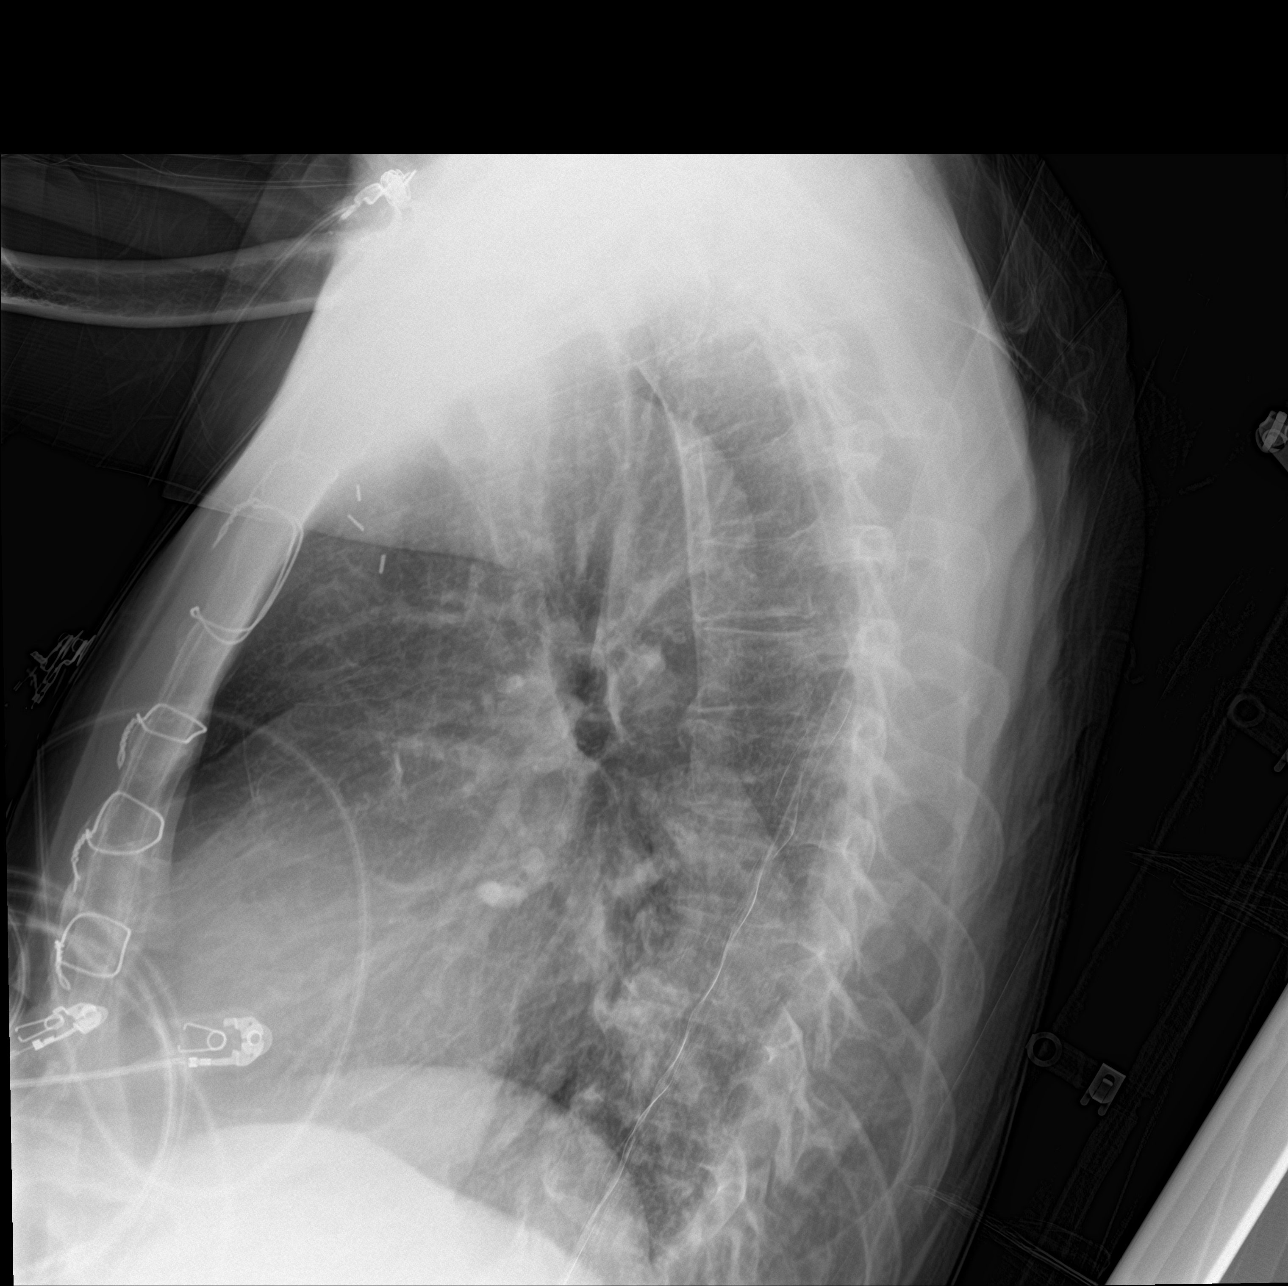

[chest ap]
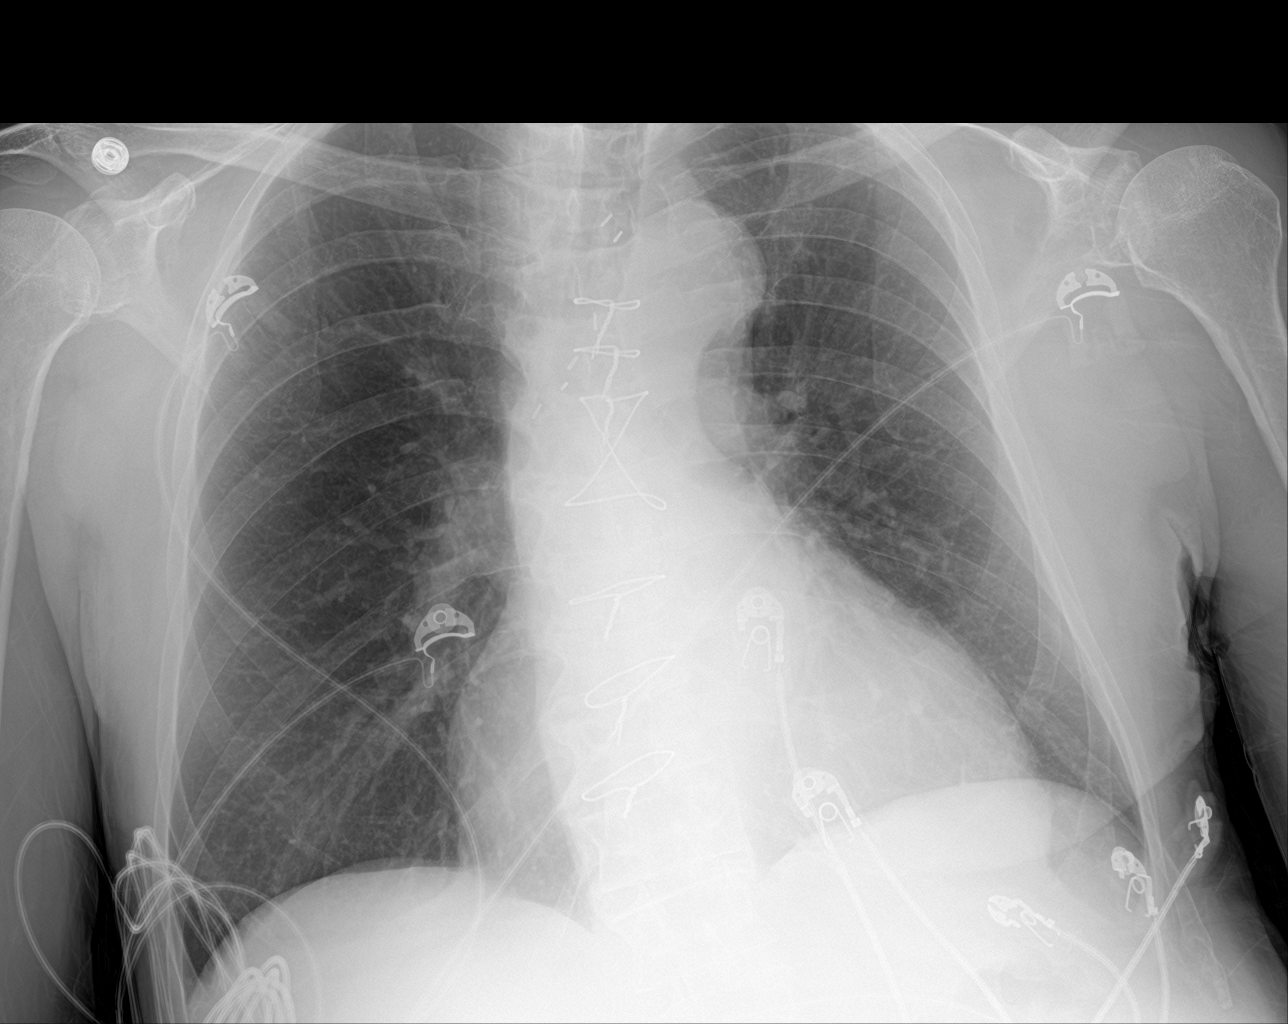

[chest lat (2 of 2)]
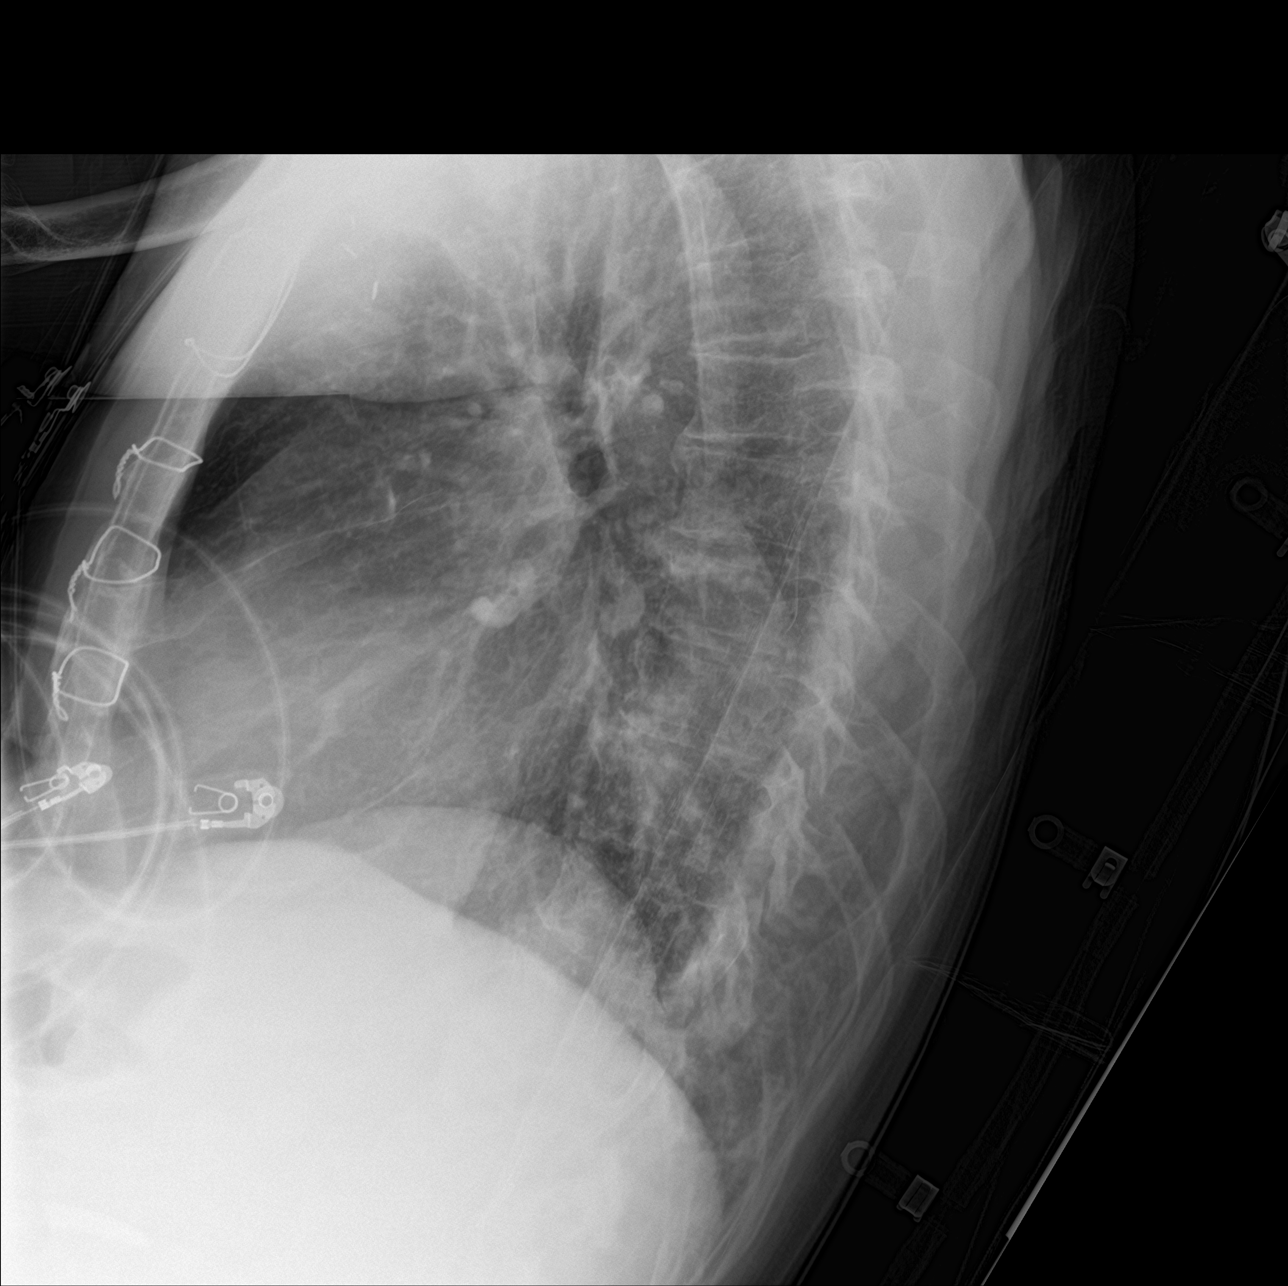

[3 of 3 positions shown; findings below may reference images not displayed]

FINDINGS: Chronic cardiomegaly. History of aortic dissection and aneurysm.
Previous median sternotomy with ascending aortic repair. There is no
edema, consolidation, effusion, or pneumothorax. No acute osseous
finding.
IMPRESSION: 1. No acute finding.
2. History of aortic aneurysm and dissection with ascending aortic
repair. Reference CTA 01/04/2017.

## 2017-12-05 ENCOUNTER — Other Ambulatory Visit: Payer: Self-pay | Admitting: Cardiovascular Disease

## 2017-12-06 ENCOUNTER — Other Ambulatory Visit: Payer: Self-pay | Admitting: Thoracic Surgery (Cardiothoracic Vascular Surgery)

## 2017-12-06 DIAGNOSIS — I7101 Dissection of thoracic aorta: Secondary | ICD-10-CM

## 2017-12-06 DIAGNOSIS — I71019 Dissection of thoracic aorta, unspecified: Secondary | ICD-10-CM

## 2017-12-06 DIAGNOSIS — I71011 Dissection of aortic arch: Secondary | ICD-10-CM

## 2017-12-06 NOTE — Progress Notes (Unsigned)
Ct angio 

## 2017-12-12 ENCOUNTER — Encounter: Payer: Self-pay | Admitting: Diagnostic Neuroimaging

## 2017-12-19 ENCOUNTER — Emergency Department (HOSPITAL_COMMUNITY): Payer: Medicare Other

## 2017-12-19 ENCOUNTER — Encounter (HOSPITAL_COMMUNITY): Payer: Self-pay

## 2017-12-19 ENCOUNTER — Emergency Department (HOSPITAL_COMMUNITY)
Admission: EM | Admit: 2017-12-19 | Discharge: 2017-12-19 | Disposition: A | Discharge status: Home / Self Care | Payer: Medicare Other | Attending: Emergency Medicine | Admitting: Emergency Medicine

## 2017-12-19 ENCOUNTER — Other Ambulatory Visit: Payer: Self-pay

## 2017-12-19 DIAGNOSIS — Y999 Unspecified external cause status: Secondary | ICD-10-CM | POA: Diagnosis not present

## 2017-12-19 DIAGNOSIS — W010XXA Fall on same level from slipping, tripping and stumbling without subsequent striking against object, initial encounter: Secondary | ICD-10-CM | POA: Diagnosis not present

## 2017-12-19 DIAGNOSIS — Y929 Unspecified place or not applicable: Secondary | ICD-10-CM | POA: Diagnosis not present

## 2017-12-19 DIAGNOSIS — Y9389 Activity, other specified: Secondary | ICD-10-CM | POA: Diagnosis not present

## 2017-12-19 DIAGNOSIS — R220 Localized swelling, mass and lump, head: Secondary | ICD-10-CM | POA: Insufficient documentation

## 2017-12-19 DIAGNOSIS — I110 Hypertensive heart disease with heart failure: Secondary | ICD-10-CM | POA: Diagnosis not present

## 2017-12-19 DIAGNOSIS — I5032 Chronic diastolic (congestive) heart failure: Secondary | ICD-10-CM | POA: Diagnosis not present

## 2017-12-19 DIAGNOSIS — Z79899 Other long term (current) drug therapy: Secondary | ICD-10-CM | POA: Diagnosis not present

## 2017-12-19 DIAGNOSIS — S2241XA Multiple fractures of ribs, right side, initial encounter for closed fracture: Secondary | ICD-10-CM | POA: Insufficient documentation

## 2017-12-19 DIAGNOSIS — S299XXA Unspecified injury of thorax, initial encounter: Secondary | ICD-10-CM | POA: Diagnosis present

## 2017-12-19 MED ORDER — DICLOFENAC SODIUM 1 % TD GEL: 4.0000 g | Freq: Four times a day (QID) | TRANSDERMAL | 1 refills | Status: DC

## 2017-12-19 MED ORDER — LIDOCAINE 5 % EX PTCH
1.0000 | MEDICATED_PATCH | CUTANEOUS | Status: DC
  Administered 2017-12-19: 1 via TRANSDERMAL
  Filled 2017-12-19: qty 1

## 2017-12-19 MED ORDER — LIDOCAINE 5 % EX PTCH: 1.0000 | MEDICATED_PATCH | CUTANEOUS | 0 refills | Status: DC

## 2017-12-19 MED ORDER — HYDROCODONE-ACETAMINOPHEN 5-325 MG PO TABS: 1.0000 | ORAL_TABLET | Freq: Four times a day (QID) | ORAL | 0 refills | Status: DC | PRN

## 2017-12-19 NOTE — ED Triage Notes (Signed)
He states he tripped at home while ambulating with assist from his rolator-type walker. He c/o "small bump on my head; but mainly pain in my (right) ribs". He is ambulatory in no distress. He tells me he has M.S.

## 2017-12-19 NOTE — ED Provider Notes (Signed)
[facility] DEPT Provider Note   CSN: [id number] Arrival date & time: 12/19/17  0906     History   Chief Complaint Chief Complaint  Patient presents with  . Fall  . Rib Injury    HPI [name] is a 72 y.o. male.  Patient is a 72 year old male with a history of aortic dissection status post emergent repair, MS, CHF who is presenting today after a mechanical fall yesterday.  Because of patient's MS he has to use a walker and he states he was trying to hurry and he lost his footing and fell onto his right side on a brick floor.  He hit his right arm and chest on the floor as well as his head.  He denies any loss of consciousness.  Initially he states it did not hurt that badly but throughout the night he has to get up and down on his right side and the pain became worse.  He denies any shortness of breath but pain is worse with taking a deep breath.  It is sharp pain that does not radiate.  He denies any abdominal discomfort or nausea or vomiting.  The history is provided by the patient.  Fall  This is a new problem. The current episode started yesterday. The problem occurs constantly. The problem has been gradually worsening. Associated symptoms include chest pain. Pertinent negatives include no abdominal pain, no headaches and no shortness of breath. Associated symptoms comments: Did hit his head and on xarelto.. The symptoms are aggravated by walking and bending. The symptoms are relieved by rest. He has tried nothing for the symptoms. The treatment provided no relief.    Past Medical History:  Diagnosis Date  . Anemia   . Aneurysm of aortic arch ([place]) 04/04/2012   Chronic aneurysmal dilatation of aortic arch with chronic type A aortic dissection, s/p replacement of ascending thoracic aorta  . Aortic dissection ([place]) 11/05/1998   S/P emergency repair of acute type A aortic dissection with resuspension of native aortic valve  . Aortic dissection,  thoracic ([place])   . Aortic root aneurysm ([place])   . Atrial fibrillation ([place]) 01/30/2013   Atrial fibrillation   . Bilateral lower extremity edema   . BPH (benign prostatic hyperplasia)   . CHF (congestive heart failure) ([place]) 06-27-1946   2D Echo - EF 50-55%, mild-moderately dilated right ventricle, mild-moderate tricuspid valve regurgitation, moderately dilated right atrium  . Depression   . Dyslipidemia   . Exertional shortness of breath    "for awhile now" (09/11/2013)  . Hemorrhoids   . History of blood transfusion   . Hypertension   . Mitral regurgitation 02/20/2013  . Multiple sclerosis ([place])   . Neuromuscular disorder ([place])   . Pleural effusion, right, large 01/30/2013  . Severe tricuspid valve regurgitation 01/30/2013   Severe tricuspid regurg by recent 2-D echo with a dilated tricuspid annulus, moderate pulmonary hypertension and biatrial enlargement   . Thrombocytopenia [facility])     Patient Active Problem List   Diagnosis Date Noted  . Aortic root aneurysm ([place])   . Aneurysm of aortic arch ([place]) 12/31/2014  . Tricuspid regurgitation 10/08/2014  . RVF (right ventricular failure) ([place]) 10/08/2014  . Chronic diastolic congestive heart failure ([place]) 10/09/2013  . Sinus pause 09/16/2013  . Anemia due to blood loss, acute in setting of supratheraputic INR 09/11/13 09/12/2013  . 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 Acute renal insufficiency 09/12/2013  . Anasarca 09/12/2013  .  Anemia 09/12/2013  . Bilateral leg edema 09/11/2013  . Acute right-sided congestive heart failure ([place]) 09/11/2013  . Unstable gait 09/11/2013  . Long term (current) use of anticoagulants 04/11/2013  . Dissection of aorta, thoracic- surgery Feb 2000 02/20/2013  . Mitral regurgitation 02/20/2013  . Lower extremity edema 02/20/2013  . Persistent atrial fibrillation ([place]) 01/30/2013  . Volume overload 01/30/2013  . Severe tricuspid valve regurgitation 01/30/2013  . Pleural  effusion, right, large 01/30/2013  . MS (multiple sclerosis) ([place]) 01/13/2013  . Thrombocytopenia ([place]) 07/26/2011  . Aortic dissection- chronic abd dissection  11/05/1998    Past Surgical History:  Procedure Laterality Date  . HEMORRHOID SURGERY  2009   Internal, external hemorrhoidectomy, general anesthesia,prone position. [Other]  . Open reduction and internal fixation of right distal radius fracture using [employer] distal radius volar locking plate.  07/2005   Dr Ninfa Linden  . Repair of acute type A aortic dissection with resuspension of native aortic valve  10/15/1998   Dr Roxy Manns  . TEE WITHOUT CARDIOVERSION N/A 02/17/2013   Procedure: TRANSESOPHAGEAL ECHOCARDIOGRAM (TEE);  Surgeon: [name], MD;  Location: [facility] ENDOSCOPY;  Service: Cardiovascular;  Laterality: N/A;        Home Medications    Prior to Admission medications   Medication Sig Start Date End Date Taking? Authorizing Provider  carvedilol (COREG) 3.125 MG tablet Take 1 tablet (3.125 mg total) by mouth 2 (two) times daily. Patient not taking: Reported on 08/18/2017 06/03/17 09/01/17  Bensimhon, Shaune Pascal, MD  Cholecalciferol (VITAMIN D3) 2000 UNITS TABS Take 1 capsule by mouth daily.    [provider]  dalfampridine 10 MG TB12 TAKE ONE TABLET BY MOUTH TWICE DAILY (APPROXIMATELY 12 HOURS APART). MAY BE TAKEN WITH OR WITHOUT FOOD. DO NOT CUT OR CRUSH. STORE AT ROOM T 07/27/17   Penumalli, Earlean Polka, MD  ELIQUIS 5 MG TABS tablet TAKE 1 TABLET BY MOUTH TWICE DAILY. 12/06/17   [name], MD  ferrous sulfate 325 (65 FE) MG tablet Take 1 tablet (325 mg total) by mouth 3 (three) times daily with meals. 09/18/13   Caren Griffins, MD  folic acid (FOLVITE) 1 MG tablet Take 1 mg by mouth daily.      [provider]  furosemide (LASIX) 40 MG tablet Take 1 tablet (40 mg total) by mouth 2 (two) times daily. May take an additional tab in the PM as needed for weight gain 01/14/17   Bensimhon, Shaune Pascal, MD    losartan (COZAAR) 100 MG tablet TAKE 1 TABLET ONCE DAILY. 10/18/17   Bensimhon, Shaune Pascal, MD  NON FORMULARY Take 2 tablets by mouth daily. OTC Laxative    [provider]  QUEtiapine (SEROQUEL) 25 MG tablet Take 25 mg by mouth at bedtime as needed.     [provider]  spironolactone (ALDACTONE) 25 MG tablet TAKE 1 TABLET EACH DAY. 05/06/17   Bensimhon, Shaune Pascal, MD  TECFIDERA 240 MG CPDR TAKE ONE CAPSULE (240 MG) BY MOUTH TWICE DAILY. STORE IN ORIGINAL CONTAINER AT ROOM TEMPERATURE. 07/27/17   Penumalli, Earlean Polka, MD  triamcinolone cream (KENALOG) 0.1 %  11/06/14   [provider]    Family History Family History  Problem Relation Age of Onset  . Thyroid cancer Father        smoked  . Heart attack Mother   . Lung cancer Brother        smoked  . Emphysema Brother        smoked  Social History Social History   Tobacco Use  . Smoking status: Never Smoker  . Smokeless tobacco: Never Used  Substance Use Topics  . Alcohol use: Yes    Comment: 09/11/2013 "might have a drink once/month"  . Drug use: No     Allergies   Patient has no known allergies.   Review of Systems Review of Systems  Respiratory: Negative for shortness of breath.   Cardiovascular: Positive for chest pain.  Gastrointestinal: Negative for abdominal pain.  Neurological: Negative for headaches.  All other systems reviewed and are negative.    Physical Exam Updated Vital Signs BP (!) 164/69   Pulse (!) 116   Temp (!) 97.5 F (36.4 C) (Oral)   Resp 18   SpO2 100%   Physical Exam  Constitutional: He is oriented to person, place, and time. He appears well-developed and well-nourished. No distress.  HENT:  Head: Normocephalic. Head is with contusion.    Mouth/Throat: Oropharynx is clear and moist.  Eyes: Pupils are equal, round, and reactive to light. Conjunctivae and EOM are normal.  Neck: Normal range of motion. Neck supple.  Cardiovascular: Regular rhythm and intact  distal pulses. Tachycardia present.  No murmur heard. Pulmonary/Chest: Effort normal and breath sounds normal. No respiratory distress. He has no wheezes. He has no rales.     He exhibits tenderness. He exhibits no crepitus, no deformity and no swelling.  Abdominal: Soft. He exhibits no distension. There is no tenderness. There is no rebound and no guarding.  Musculoskeletal: Normal range of motion. He exhibits edema. He exhibits no tenderness.       Arms: Neurological: He is alert and oriented to person, place, and time.  Skin: Skin is warm and dry. No rash noted. No erythema.  Psychiatric: He has a normal mood and affect. His behavior is normal.  Nursing note and vitals reviewed.    ED Treatments / Results  Labs (all labs ordered are listed, but only abnormal results are displayed) Labs Reviewed - No data to display  EKG None  Radiology Dg Ribs Unilateral W/chest Right  Result Date: 12/19/2017 CLINICAL DATA:  72 year old male with right-sided rib pain after falling EXAM: RIGHT RIBS AND CHEST - 3+ VIEW COMPARISON:  Prior chest x-ray 06/18/2017 FINDINGS: Acute minimally displaced fracture of the lateral aspect of the right sixth rib. Acute nondisplaced fracture of the lateral aspect of the right seventh rib. Cardiomegaly. Patient is status post median sternotomy. The lungs are clear. IMPRESSION: 1. Acute mildly displaced fracture of the right sixth rib and acute nondisplaced fracture of the right seventh rib. 2. Stable cardiomegaly. Electronically Signed   By: Jacqulynn Cadet M.D.   On: 12/19/2017 10:24   Ct Head Wo Contrast  Result Date: 12/19/2017 CLINICAL DATA:  72 year old male with a palpable knot posterior to the left ear after falling EXAM: CT HEAD WITHOUT CONTRAST TECHNIQUE: Contiguous axial images were obtained from the base of the skull through the vertex without intravenous contrast. COMPARISON:  Brain MRI 02/24/2015 FINDINGS: Brain: No evidence of acute infarction,  hemorrhage, hydrocephalus, extra-axial collection or mass lesion/mass effect. Moderate cerebral and cerebellar cortical atrophy. Prior right cerebellar infarct. Moderately advanced chronic microvascular ischemic white matter disease. Vascular: No hyperdense vessel or unexpected calcification. Skull: Normal. Negative for fracture or focal lesion. Sinuses/Orbits: The mastoid air cells are clear. There are a few small mucous retention cysts versus polyps in the maxillary sinuses. The frontal and ethmoid sinuses are clear. Other: The palpable abnormality posterior to the left  ear corresponds with a calcified mass in the superficial subcutaneous fat. This likely represents a calcified sebaceous cyst. IMPRESSION: 1. No acute intracranial abnormality. 2. Cerebral atrophy, remote right cerebellar infarct and chronic microvascular ischemic white matter disease. 3. Palpable abnormality posterior to the left ear corresponds with a calcified soft tissue cyst. No acute hematoma. Electronically Signed   By: Jacqulynn Cadet M.D.   On: 12/19/2017 10:22    Procedures Procedures (including critical care time)  Medications Ordered in ED Medications  lidocaine (LIDODERM) 5 % 1 patch (has no administration in time range)     Initial Impression / Assessment and Plan / ED Course  I have reviewed the triage vital signs and the nursing notes.  Pertinent labs & imaging results that were available during my care of the patient were reviewed by me and considered in my medical decision making (see chart for details).     Elderly male with multiple medical problems on anticoagulation who had a mechanical fall yesterday.  Patient did hit his head but did not lose consciousness.  He currently denies headache or new neurologic issues.  His biggest complaint is of right rib pain.  He denies any shortness of breath and the pain is exacerbated by movement.  Head CT and right rib films and chest x-ray is pending.  Patient refuses  pain medication at this time.  10:52 AM Head CT is negative.  Patient's chest x-ray shows right sixth and seventh rib fractures.  Patient remains comfortable with no significant shortness of breath satting 100% on room air.  Will be given Lidoderm patches, Voltaren gel and to use Vicodin sparingly if the pain is uncontrollable Final Clinical Impressions(s) / ED Diagnoses   Final diagnoses:  Closed fracture of multiple ribs of right side, initial encounter    ED Discharge Orders        Ordered    lidocaine (LIDODERM) 5 %  Every 24 hours     12/19/17 1057    HYDROcodone-acetaminophen (NORCO/VICODIN) 5-325 MG tablet  Every 6 hours PRN     12/19/17 1057    diclofenac sodium (VOLTAREN) 1 % GEL  4 times daily     12/19/17 1057       Blanchie Dessert, MD 12/19/17 1058

## 2017-12-27 ENCOUNTER — Encounter: Payer: Self-pay | Admitting: Thoracic Surgery (Cardiothoracic Vascular Surgery)

## 2017-12-27 ENCOUNTER — Ambulatory Visit
Admission: RE | Admit: 2017-12-27 | Discharge: 2017-12-27 | Disposition: A | Discharge status: Home / Self Care | Payer: Medicare Other | Source: Ambulatory Visit | Attending: Thoracic Surgery (Cardiothoracic Vascular Surgery) | Admitting: Thoracic Surgery (Cardiothoracic Vascular Surgery)

## 2017-12-27 ENCOUNTER — Other Ambulatory Visit: Payer: Self-pay

## 2017-12-27 ENCOUNTER — Ambulatory Visit (INDEPENDENT_AMBULATORY_CARE_PROVIDER_SITE_OTHER): Payer: Medicare Other | Admitting: Thoracic Surgery (Cardiothoracic Vascular Surgery)

## 2017-12-27 VITALS — BP 147/65 | HR 79 | Resp 16 | Ht 68.0 in | Wt 155.0 lb

## 2017-12-27 DIAGNOSIS — I71011 Dissection of aortic arch: Secondary | ICD-10-CM

## 2017-12-27 DIAGNOSIS — I719 Aortic aneurysm of unspecified site, without rupture: Secondary | ICD-10-CM

## 2017-12-27 DIAGNOSIS — I7122 Aneurysm of the aortic arch, without rupture: Secondary | ICD-10-CM

## 2017-12-27 DIAGNOSIS — I71019 Dissection of thoracic aorta, unspecified: Secondary | ICD-10-CM

## 2017-12-27 DIAGNOSIS — I712 Thoracic aortic aneurysm, without rupture: Secondary | ICD-10-CM | POA: Diagnosis not present

## 2017-12-27 DIAGNOSIS — I7101 Dissection of thoracic aorta: Secondary | ICD-10-CM

## 2017-12-27 DIAGNOSIS — I7121 Aneurysm of the ascending aorta, without rupture: Secondary | ICD-10-CM

## 2017-12-27 DIAGNOSIS — Z09 Encounter for follow-up examination after completed treatment for conditions other than malignant neoplasm: Secondary | ICD-10-CM

## 2017-12-27 MED ORDER — IOPAMIDOL (ISOVUE-370) INJECTION 76%
75.0000 mL | Freq: Once | INTRAVENOUS | Status: AC | PRN
  Administered 2017-12-27: 75 mL via INTRAVENOUS

## 2017-12-27 NOTE — Progress Notes (Signed)
[address]       [place] [address]             [facility] OFFICE NOTE  Referring Provider is [name], MD  [facility] Cardiologist is Glori Bickers, MD PCP is Levin Erp, MD   HPI:  Patient is a 72 year old male with complex past medical history including severe multiple sclerosis who returns to the office today for routine follow-up of chronic type A aortic dissection status post repair in 2000 with progressive aneurysmal enlargement of the remaining dilated aortic root and chronically dissected transverse aortic arch. The patient also has chronic diastolic and right sided congestive heart failure, tricuspid regurgitation, and atrial fibrillation.  He is not felt to be surgical candidate for surgical treatment of his slowly enlarging aortic root aneurysm nor his chronically dissected transverse and descending thoracic aorta.  He was last seen in follow-up in our office on January 04, 2017.  Since then he has remained clinically stable.  His multiple sclerosis is also remained reasonably stable.  He still remains remarkably consistent with his efforts to maintain his mobility.  He swims at least 3 days a week, although 2 weeks ago he suffered a mechanical fall and broke 3 ribs.   His congestive heart failure has remained remarkably stable on medical therapy.  The patient also reports that his blood pressure has remained under good control.  Current Outpatient Medications  Medication Sig Dispense Refill  . Cholecalciferol (VITAMIN D3) 2000 UNITS TABS Take 1 capsule by mouth daily.    Marland Kitchen dalfampridine 10 MG TB12 TAKE ONE TABLET BY MOUTH TWICE DAILY (APPROXIMATELY 12 HOURS APART). MAY BE TAKEN WITH OR WITHOUT FOOD. DO NOT CUT OR CRUSH. STORE AT ROOM T 180 tablet 4  . docusate sodium (COLACE) 100 MG capsule Take 100 mg by mouth daily as needed for mild constipation.    Marland Kitchen ELIQUIS 5 MG TABS tablet TAKE 1 TABLET BY MOUTH TWICE DAILY. 180  tablet 1  . ferrous sulfate 325 (65 FE) MG tablet Take 1 tablet (325 mg total) by mouth 3 (three) times daily with meals. 90 tablet 1  . folic acid (FOLVITE) 1 MG tablet Take 1 mg by mouth daily.      . furosemide (LASIX) 40 MG tablet Take 1 tablet (40 mg total) by mouth 2 (two) times daily. May take an additional tab in the PM as needed for weight gain 270 tablet 3  . lidocaine (LIDODERM) 5 % Place 1 patch onto the skin daily. Remove & Discard patch within 12 hours or as directed by MD 30 patch 0  . losartan (COZAAR) 100 MG tablet TAKE 1 TABLET ONCE DAILY. 30 tablet 3  . Multiple Vitamin (MULTIVITAMIN WITH MINERALS) TABS tablet Take 1 tablet by mouth daily. Centrum for men.    Marland Kitchen QUEtiapine (SEROQUEL) 25 MG tablet Take 25 mg by mouth at bedtime as needed.     Marland Kitchen spironolactone (ALDACTONE) 25 MG tablet TAKE 1 TABLET EACH DAY. 30 tablet 2  . TECFIDERA 240 MG CPDR TAKE ONE CAPSULE (240 MG) BY MOUTH TWICE DAILY. STORE IN ORIGINAL CONTAINER AT ROOM TEMPERATURE. 180 capsule 4  . triamcinolone cream (KENALOG) 0.1 % Apply 1 application topically daily as needed (rash).      No current facility-administered medications for this visit.       Physical Exam:   BP (!) 147/65 (BP Location: Left Arm, Patient Position: Sitting, Cuff Size:  Normal)   Pulse 79   Resp 16   Ht 5\' 8"  (1.727 m)   Wt 155 lb (70.3 kg)   SpO2 99% Comment: ON RA  BMI 23.57 kg/m   General:  Chronically ill appearing in good spirits  Chest:   Clear to auscultation  CV:   Regular rate and rhythm without murmur  Incisions:  n/a  Abdomen:  Soft nontender  Extremities:  Warm and well-perfused, no edema  Diagnostic Tests:  CT ANGIOGRAPHY CHEST, ABDOMEN AND PELVIS  TECHNIQUE: Multidetector CT imaging through the chest, abdomen and pelvis was performed using the standard protocol during bolus administration of intravenous contrast. Multiplanar reconstructed images and MIPs were obtained and reviewed to evaluate the vascular  anatomy.  Creatinine was obtained on site at [facility] at [address]  Results: Creatinine 1.4 mg/dL.  CONTRAST:  83mL ISOVUE-370 IOPAMIDOL (ISOVUE-370) INJECTION 76%  COMPARISON:  Prior CTA of the chest abdomen and pelvis 01/04/2017  FINDINGS: CTA CHEST FINDINGS  Cardiovascular: Surgical changes of prior open repair of an ascending thoracic aortic aneurysm with tube graft replacement of the tubular portion of the ascending thoracic aorta. There has been further dilatation of the non coronary sinus which now measures 4.9 cm compared to 4.5 cm previously. The aortic root now measures up to 5.9 cm compared to 5.7 cm previously. Chronic aneurysmal dilatation of the aortic arch is stable in size at 5.6 cm. The dissection flap again extends through the right brachiocephalic artery and into the right common carotid artery without evidence of flow limitation. The left vertebral artery is dominant. The dissection extends throughout the descending thoracic aorta which remains normal in caliber. Stable cardiomegaly. Calcifications present along the coronary arteries. No pericardial effusion.  Mediastinum/Nodes: Unremarkable CT appearance of the thyroid gland. No suspicious mediastinal or hilar adenopathy. No soft tissue mediastinal mass. The thoracic esophagus is unremarkable.  Lungs/Pleura: Lungs are clear. No pleural effusion or pneumothorax. Small subpleural lymph node in the posterior aspect of the right lower lobe without significant interval change compared to 01/07/2016.  Musculoskeletal: No acute fracture or aggressive appearing lytic or blastic osseous lesion. Healed median sternotomy.  Review of the MIP images confirms the above findings.  CTA ABDOMEN AND PELVIS FINDINGS  VASCULAR  Aorta: The dissection extends throughout the abdominal aorta and into the right external iliac artery. The true lumen is significantly compressed throughout  the right iliac system resulting in significant differential contrast enhancement of the common femoral arteries.  Celiac: Arises from the true lumen. No dissection, stenosis, aneurysm.  SMA: Arises from the true lumen. No dissection, aneurysm or significant stenosis.  Renals: Solitary renal arteries bilaterally, both of which arise from the true lumen although there appears to be a fenestration of the dissection flap at the origin of the right renal artery. Heterogeneous atherosclerotic plaque results in moderate focal stenosis at the origin of the left renal artery.  IMA: Arises from the true lumen.  Patent.  Inflow: The dissection flap extends throughout the right common and external iliac arteries resulting in significant compression of the true lumen. The right internal iliac artery arises from the true lumen and remains patent. There is significant contrast enhancement differential between the right and left common femoral arteries with the right demonstrating significantly less enhancement consistent with slower flow. Small penetrating atherosclerotic ulcers are present arising from the distal common iliac arteries bilaterally. The left internal iliac artery remains patent. The left external iliac, common femoral, and the visualized branches of  the proximal superficial and profunda femoral arteries are all widely patent.  Veins: No acute venous abnormality.  Review of the MIP images confirms the above findings.  NON-VASCULAR  Hepatobiliary: Similar appearance of the liver with what appears to be venovenous shunting in the superior aspect of hepatic segment 4 a. stable small transient hepatic attenuation difference in hepatic segment 5/6, unchanged. Enlarging nodular enhancement in the region of the gallbladder fundus now measures 1.8 x 1.6 cm compared to 1.4 x 1.0 cm previously.  Pancreas: Unremarkable. No pancreatic ductal dilatation or surrounding  inflammatory changes.  Spleen: Normal in size without focal abnormality.  Adrenals/Urinary Tract: Normal adrenal glands. No evidence of enhancing renal mass, hydronephrosis or nephrolithiasis. Multiple bilateral simple renal cysts are again noted without significant interval change. Unremarkable ureters and bladder.  Stomach/Bowel: No focal bowel wall thickening or evidence of obstruction.  Lymphatic: No suspicious lymphadenopathy.  Reproductive: Prostate is unremarkable.  Other: Small omental fat containing umbilical hernia. Irregularly-shaped approximately 3.3 x 1.1 cm high attenuation soft tissue mass in the superficial subcutaneous fat overlying the right upper quadrant abdominal wall. No evidence of enhancement when compared with the pre contrast images. There is slight overlying skin thickening. This appearance is most suggestive of a focal superficial hematoma. No abdominopelvic ascites.  Musculoskeletal: No acute fracture or aggressive appearing lytic or blastic osseous lesion.  Review of the MIP images confirms the above findings.  IMPRESSION: CTA CHEST  1. Continued slight interval enlargement of the aneurysmal non coronary sinus of Valsalva which now measures up to 4.7 cm in diameter compared to 4.5 cm previously. 2. Slight interval enlargement of the aortic root due primarily to the enlarging non coronary sinus. The maximal root diameter is now 5.9 cm compared to 5.7 cm previously. 3. Stable postsurgical changes of open tube graft repair of an ascending thoracic aortic dissection without evidence of complication. 4. Stable chronic dissecting thoracic aortic aneurysm extending from the distal anastomosis of the tube graft repair throughout the thoracic and abdominal aorta terminating in the distal most right external iliac artery. The maximal diameter of the aneurysm in the transverse aorta remains unchanged at 5.6 cm. 5. Stable chronic non flow  limiting dissection flap in the right brachiocephalic and right common carotid arteries. No evidence of propagation. 6. Cardiomegaly. 7. Coronary artery calcifications. 8. The previously noted focus of ground-glass attenuation airspace opacity in the right upper lobe has resolved.  CTA ABD/PELVIS  1. Continued enlargement of an enhancing soft tissue nodule in the region of the gallbladder fundus which now measures 1.8 x 1.6 cm compared to 1.4 x 1.0 cm on 01/04/2017. This remains concerning for an enlarging gallbladder polyp, or possibly gallbladder carcinoma. If further imaging is clinically warranted, consider either right upper quadrant ultrasound or gadolinium-enhanced MRI of the abdomen. 2. Stable chronic dissection extending throughout the abdominal aorta and into the distal most aspect of the right external iliac artery. On today's examination, there is a more conspicuous differential between the degree of contrast enhancement of the femoral arteries with the right femoral artery enhancing much less than the left. This indicates relatively slower transit within the false lumen in the chronically dissected right iliac system. The difference between today's examination and previous examinations may be more reflective of differences in contrast bolus timing rather than a true physiologic change. Does the patient have clinical symptoms of new or progressive right lower extremity claudication? 3. Small nonenhancing but high attenuation subcutaneous mass within the anterior abdominal wall of the right  upper quadrant overlying the liver. This is favored to reflect a focal subcutaneous hematoma. Abscess, or focal cellulitis are possible but considered less likely. This should be amenable to direct inspection. 4. Additional ancillary findings without significant interval change.  Signed,  Criselda Peaches, MD  [facility]  [facility]   Electronically Signed   By: Jacqulynn Cadet M.D.   On: 12/27/2017 13:48    Impression:  Patient underwent emergent repair of acute type a aortic dissection with resuspension of the native aortic valve almost 20 years ago.  He now has slow gradual enlargement of the aortic root with somewhat asymmetric dilatation of the noncoronary sinus of Valsalva with relatively stable aneurysmal enlargement of the chronically dissected transverse aortic arch and descending thoracic aorta.  He has numerous other medical problems including multiple sclerosis which severely limits his mobility.  He is not a surgical candidate.  I have personally reviewed the patient's recent follow-up CT angiogram.  Over the past year there appears to be slight interval enlargement of the aortic root, particularly involving the noncoronary sinus of Valsalva.  Cardiac gated CT angiography would be better to specifically evaluate the size of the aortic root and interval change over time, but given the fact that the patient is not a surgical candidate it seems unnecessary to investigate any further.  The remainder of the patient's chronically dissected aorta remains essentially stable in size.  Patient also was noted to have a small soft tissue nodule in the fundus of the gallbladder which may represent a benign gallbladder polyp.  Surgical removal of the gallbladder would likely be necessary to definitively rule out malignancy.   Plan:  I have discussed the results of the patient's recent follow-up CT angiogram at length with the patient in the office today.  He specifically denies any symptoms which could be related to his gallbladder and he is not interested in pursuing the small soft tissue nodule appearing in the fundus of the gallbladder at this time.  We plan follow-up CT angiogram of the aorta in 1 year.  We have not recommended any change the patient's current medications.  All  questions answered.  I spent in excess of 15 minutes during the conduct of this office consultation and >50% of this time involved direct face-to-face encounter with the patient for counseling and/or coordination of their care.   Valentina Gu. Roxy Manns, MD 12/27/2017 2:01 PM

## 2017-12-27 NOTE — Patient Instructions (Signed)
Continue all previous medications without any changes at this time  

## 2018-01-05 ENCOUNTER — Encounter: Payer: Self-pay | Admitting: Diagnostic Neuroimaging

## 2018-02-03 ENCOUNTER — Ambulatory Visit: Payer: Self-pay | Admitting: Pharmacist Clinician (PhC)/ Clinical Pharmacy Specialist

## 2018-02-03 DIAGNOSIS — Z7901 Long term (current) use of anticoagulants: Secondary | ICD-10-CM

## 2018-02-03 DIAGNOSIS — I4819 Other persistent atrial fibrillation: Secondary | ICD-10-CM

## 2018-02-08 ENCOUNTER — Other Ambulatory Visit (HOSPITAL_COMMUNITY): Payer: Self-pay | Admitting: Internal Medicine

## 2018-02-12 ENCOUNTER — Other Ambulatory Visit (HOSPITAL_COMMUNITY): Payer: Self-pay | Admitting: Internal Medicine

## 2018-02-16 ENCOUNTER — Telehealth (HOSPITAL_COMMUNITY): Payer: Self-pay | Admitting: *Deleted

## 2018-02-16 MED ORDER — LOSARTAN POTASSIUM 100 MG PO TABS: 100.0000 mg | ORAL_TABLET | Freq: Every day | ORAL | 3 refills | Status: DC

## 2018-02-16 NOTE — Telephone Encounter (Signed)
Refill encounter ?

## 2018-02-17 ENCOUNTER — Telehealth: Payer: Self-pay | Admitting: *Deleted

## 2018-02-17 NOTE — Telephone Encounter (Addendum)
Received notice: Ampyra 10 mg shipped on 02/03/18., Tecfidera DR 240 mg shipped on 02/03/18

## 2018-03-29 ENCOUNTER — Telehealth: Payer: Self-pay | Admitting: *Deleted

## 2018-03-29 ENCOUNTER — Other Ambulatory Visit (HOSPITAL_COMMUNITY): Payer: Self-pay | Admitting: Internal Medicine

## 2018-03-29 NOTE — Telephone Encounter (Signed)
[facility].  Tecfidera PA,  Attempted Cover My Meds and was not able to finish, had to call [phone].  Spoke to [place] and [place] for tecfidera 240mg  po BID initiated for continuation of MS therapy (pt on since 2014).  To hear back in 24-72 hours by fax.

## 2018-03-30 ENCOUNTER — Other Ambulatory Visit (HOSPITAL_COMMUNITY): Payer: Self-pay | Admitting: *Deleted

## 2018-03-30 NOTE — Telephone Encounter (Signed)
Received fax that [place] approved from 03-29-18 thru 03-29-2020 [place]. [facility]  [phone].

## 2018-03-31 ENCOUNTER — Other Ambulatory Visit (HOSPITAL_COMMUNITY): Payer: Self-pay | Admitting: Cardiology

## 2018-03-31 MED ORDER — FUROSEMIDE 40 MG PO TABS: 40.0000 mg | ORAL_TABLET | Freq: Two times a day (BID) | ORAL | 0 refills | Status: DC

## 2018-04-28 ENCOUNTER — Other Ambulatory Visit: Payer: Self-pay | Admitting: Cardiovascular Disease

## 2018-05-05 ENCOUNTER — Telehealth: Payer: Self-pay | Admitting: *Deleted

## 2018-05-05 NOTE — Telephone Encounter (Signed)
Received fax from [place] re: Ampyra shipped 04/30/18; Tecfidera shipped 04/30/18.

## 2018-05-09 ENCOUNTER — Encounter: Payer: Self-pay | Admitting: Thoracic Surgery (Cardiothoracic Vascular Surgery)

## 2018-05-10 ENCOUNTER — Encounter: Payer: Self-pay | Admitting: Thoracic Surgery (Cardiothoracic Vascular Surgery)

## 2018-06-11 ENCOUNTER — Other Ambulatory Visit (HOSPITAL_COMMUNITY): Payer: Self-pay | Admitting: Internal Medicine

## 2018-06-17 ENCOUNTER — Other Ambulatory Visit (HOSPITAL_COMMUNITY): Payer: Self-pay | Admitting: Internal Medicine

## 2018-06-17 ENCOUNTER — Other Ambulatory Visit (HOSPITAL_COMMUNITY): Payer: Self-pay

## 2018-06-17 MED ORDER — FUROSEMIDE 40 MG PO TABS: 40.0000 mg | ORAL_TABLET | Freq: Two times a day (BID) | ORAL | 0 refills | Status: DC

## 2018-07-06 ENCOUNTER — Encounter (HOSPITAL_COMMUNITY): Payer: Self-pay

## 2018-07-06 ENCOUNTER — Ambulatory Visit (HOSPITAL_COMMUNITY)
Admission: RE | Admit: 2018-07-06 | Discharge: 2018-07-06 | Disposition: A | Discharge status: Home / Self Care | Payer: Medicare Other | Source: Ambulatory Visit | Attending: Internal Medicine | Admitting: Internal Medicine

## 2018-07-06 VITALS — BP 148/92 | HR 60 | Wt 161.0 lb

## 2018-07-06 DIAGNOSIS — I7101 Dissection of thoracic aorta: Secondary | ICD-10-CM | POA: Diagnosis not present

## 2018-07-06 DIAGNOSIS — I110 Hypertensive heart disease with heart failure: Secondary | ICD-10-CM | POA: Diagnosis not present

## 2018-07-06 DIAGNOSIS — I071 Rheumatic tricuspid insufficiency: Secondary | ICD-10-CM | POA: Diagnosis not present

## 2018-07-06 DIAGNOSIS — G35 Multiple sclerosis: Secondary | ICD-10-CM | POA: Insufficient documentation

## 2018-07-06 DIAGNOSIS — I509 Heart failure, unspecified: Secondary | ICD-10-CM | POA: Diagnosis present

## 2018-07-06 DIAGNOSIS — I50810 Right heart failure, unspecified: Secondary | ICD-10-CM | POA: Insufficient documentation

## 2018-07-06 DIAGNOSIS — I5032 Chronic diastolic (congestive) heart failure: Secondary | ICD-10-CM

## 2018-07-06 DIAGNOSIS — Z7901 Long term (current) use of anticoagulants: Secondary | ICD-10-CM | POA: Insufficient documentation

## 2018-07-06 DIAGNOSIS — I482 Chronic atrial fibrillation, unspecified: Secondary | ICD-10-CM | POA: Diagnosis not present

## 2018-07-06 DIAGNOSIS — D649 Anemia, unspecified: Secondary | ICD-10-CM

## 2018-07-06 DIAGNOSIS — Z79899 Other long term (current) drug therapy: Secondary | ICD-10-CM | POA: Insufficient documentation

## 2018-07-06 DIAGNOSIS — I4819 Other persistent atrial fibrillation: Secondary | ICD-10-CM | POA: Insufficient documentation

## 2018-07-06 LAB — BASIC METABOLIC PANEL
Anion gap: 6 (ref 5–15)
BUN: 36 mg/dL — ABNORMAL HIGH (ref 8–23)
CO2: 29 mmol/L (ref 22–32)
Calcium: 9.9 mg/dL (ref 8.9–10.3)
Chloride: 106 mmol/L (ref 98–111)
Creatinine, Ser: 1.52 mg/dL — ABNORMAL HIGH (ref 0.61–1.24)
GFR calc Af Amer: 51 mL/min — ABNORMAL LOW (ref >60)
GFR calc non Af Amer: 44 mL/min — ABNORMAL LOW (ref >60)
Glucose, Bld: 96 mg/dL (ref 70–99)
Potassium: 4.1 mmol/L (ref 3.5–5.1)
Sodium: 141 mmol/L (ref 135–145)

## 2018-07-06 NOTE — Progress Notes (Signed)
ADVANCED HEART FAILURE CLINIC NOTE  Patient ID: [name], male   DOB: November 06, 1945, 72 y.o.   MRN: [id number] Referring MD : Dr Roxy Manns Cardiologist: Dr [name]  PCP: Dr Zada Girt   HPI: [name] is a 72 year old retired Engineer, production professor with a history of advanced multiple sclerosis, Type A aortic dissection 2000, right heart failure with severe TR and chronic AF referred by [name] for further evaluation for possible TV repair.   He presented acutely on October 15, 1998 with acute type A aortic dissection. He underwent emergency surgical repair with hemi-arch replacement of the ascending thoracic aorta and resuspension of the native aortic valve. 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 He has been followed closely in [facility] office due to interval increase size in the patient's aortic root aneurysm as well as the aneurysm of the chronically dissected aortic arch (last seen earlier this week). Because of the patient's underlying multiple sclerosis with severe generalized weakness patient was felt to be candidate for surgical intervention. He was referred to [facility] for a second opinion and again not felt to be candidate for elective surgery  Admitted in December 2014 with right heart failure including severe bilateral lower extremity edema and generalized anasarca. Diuresed well and was started on Coumadin for long-term anticoagulation because of chronic persistent atrial fibrillation.  Echo in 3/15 actually showed that tricuspid regurgitation looked more moderate; EF 55%, mild AI, aortic root at sinuses of valsalva 4.8 cm, moderate Timothy, mildly dilated RV moderate TR, PA systolic pressure 46 mmHg.   Seen in [facility] office 4/18 and CT showed No significant interval change in aneurysmal dilatation of the aortic root, the non coronary sinus  of Valsalva, and the residual tubular and proximal transverse thoracic aorta.   Last seen by Dr Roxy Manns 12/27/2017/ At that time he was stable. Plans to follow yearly with CT angiogram of aorta.   Today he returns for HF follow up. Overall feeling fine. Denies SOB/PND/Orthopnea.  No chest pain.  Appetite ok. No fever or chills. Weight at home has been stable. He has been taking an extra lasix when he goes out to eat. He continues to swim 1/4 mile three times a week. Taking all medications.  Echo 4/18  LVEF 60-65% RV dilated with normal function. Moderate to severe TR. AoRoot dilated 6.0 cm with mild AI   Current Outpatient Medications on File Prior to Encounter  Medication Sig Dispense Refill  . Cholecalciferol (VITAMIN D3) 2000 UNITS TABS Take 1 capsule by mouth daily.    Marland Kitchen docusate sodium (COLACE) 100 MG capsule Take 100 mg by mouth daily as needed for mild constipation.    Marland Kitchen ELIQUIS 5 MG TABS tablet TAKE 1 TABLET BY MOUTH TWICE DAILY. 180 tablet 1  . ferrous sulfate 325 (65 FE) MG tablet Take 1 tablet (325 mg total) by mouth 3 (three) times daily with meals. 90 tablet 1  . folic acid (FOLVITE) 1 MG tablet Take 1 mg by mouth daily.      . furosemide (LASIX) 40 MG tablet Take 1 tablet (40 mg total) by mouth 2 (two) times daily. May take an add'l tab in the PM as needed. Please call for follow up [phone] 120 tablet 0  . losartan (COZAAR) 100 MG tablet TAKE 1 TABLET ONCE DAILY. 30 tablet 2  . Multiple Vitamin (MULTIVITAMIN  WITH MINERALS) TABS tablet Take 1 tablet by mouth daily. Centrum for men.    Marland Kitchen QUEtiapine (SEROQUEL) 25 MG tablet Take 25 mg by mouth at bedtime as needed.     Marland Kitchen spironolactone (ALDACTONE) 25 MG tablet TAKE 1 TABLET EACH DAY. 30 tablet 2  . TECFIDERA 240 MG CPDR TAKE ONE CAPSULE (240 MG) BY MOUTH TWICE DAILY. STORE IN ORIGINAL CONTAINER AT ROOM TEMPERATURE. 180 capsule 4  . triamcinolone cream (KENALOG) 0.1 % Apply 1 application topically daily as needed (rash).      No  current facility-administered medications on file prior to encounter.       PHYSICAL EXAM: Vitals:   07/06/18 1403  BP: (!) 148/92  Pulse: 60  SpO2: 100%   Filed Weights   07/06/18 1403  Weight: 73 kg (161 lb)   General:  Well appearing. No resp difficulty. Ambulated in the clinic with rolling walker.  HEENT: normal anicteric  Neck: supple. no JVD. Carotids 2+ bilat; no bruits. No lymphadenopathy or thryomegaly appreciated. Cor: PMI nondisplaced. Irregular rate & rhythm. No rubs, gallops. 2/6 TR  Lungs: clear no wheeze Abdomen: soft, nontender, nondistended. No hepatosplenomegaly. No bruits or masses. Good bowel sounds. Extremities: no cyanosis, clubbing, rash, compression stockings R and LLE.  + RLE brace Neuro: alert & oriented x 3, cranial nerves grossly intact. Affect pleasant    ASSESSMENT & PLAN: 1. Right heart failure with moderate to severe TR  - Last echo 4/18 LV/RV function stable. TR remains moderate to severe - Repeat ECHO  - Volume status stable. Continue lasix 40 mg twice a day.  -Intolerant carvedilol - He is not a candidate for a tricuspid valve operation.  2. Aortic dissection:   - s/p hemi-arch replacement of the ascending thoracic aorta and resuspension of the native aortic valve 2000  - now with chronic dissection and enlarging aneurysm involving sinus of Valsalva and aortic arch  - CTA 4/18 was stable. Continue to follow with [facility] -Intolerant bb.  3. HTN   - .Higher today but at home SBP 110-120s.   4. Chronic AF:  -Rate controlled. Continue Eliquis 5. Severe multiple sclerosis    --continue to follow with Neuro   Follow up in 12 months. Check BMET .   Timothy Grinder, NP-C  07/06/2018  Patient seen and examined with the above-signed Advanced Practice Provider and/or Housestaff. I personally reviewed laboratory data, imaging studies and relevant notes. I independently examined the patient and formulated the important aspects of the plan. I have edited  the note to reflect any of my changes or salient points. I have personally discussed the plan with the patient and/or family.  Overall doing very well. Continues to swim on a regular basis. RHF/volume status well controlled with sliding-scale lasix. Toelerating AF well. No significant bleeding with Eliquis. Agree with repeat echo. Aortic dissection being followed by [name]. Not good candidate for surgical repair.   Glori Bickers, MD  5:51 PM

## 2018-07-06 NOTE — Patient Instructions (Signed)
It was great to see you today! No medication changes are needed at this time.  Labs today We will only contact you if something comes back abnormal or we need to make some changes. Otherwise no news is good news!  Your physician has requested that you have an echocardiogram. Echocardiography is a painless test that uses sound waves to create images of your heart. It provides your doctor with information about the size and shape of your heart and how well your heart's chambers and valves are working. This procedure takes approximately one hour. There are no restrictions for this procedure.  Your physician recommends that you schedule a follow-up appointment in: 12 months with Dr Haroldine Laws  Do the following things EVERYDAY: 1) Weigh yourself in the morning before breakfast. Write it down and keep it in a log. 2) Take your medicines as prescribed 3) Eat low salt foods-Limit salt (sodium) to 2000 mg per day.  4) Stay as active as you can everyday 5) Limit all fluids for the day to less than 2 liters

## 2018-07-07 ENCOUNTER — Encounter (HOSPITAL_COMMUNITY): Payer: Self-pay

## 2018-07-07 NOTE — Addendum Note (Signed)
Encounter addended by: Jorge Ny, LCSW on: 07/07/2018 9:13 AM  Actions taken: Visit Navigator Flowsheet section accepted, Sign clinical note

## 2018-07-07 NOTE — Progress Notes (Signed)
Pt completed SDOH survey during clinic visit- no concerns identified at this time.  CSW will continue to follow in clinic as needed.  Jorge Ny, LCSW Clinical Social Worker [facility] [phone]

## 2018-07-18 ENCOUNTER — Other Ambulatory Visit: Payer: Self-pay | Admitting: Diagnostic Neuroimaging

## 2018-07-25 ENCOUNTER — Ambulatory Visit (INDEPENDENT_AMBULATORY_CARE_PROVIDER_SITE_OTHER): Payer: Medicare Other | Admitting: Psychology

## 2018-07-25 DIAGNOSIS — F4323 Adjustment disorder with mixed anxiety and depressed mood: Secondary | ICD-10-CM

## 2018-07-27 ENCOUNTER — Other Ambulatory Visit (HOSPITAL_COMMUNITY): Payer: Self-pay | Admitting: Internal Medicine

## 2018-08-03 ENCOUNTER — Ambulatory Visit (INDEPENDENT_AMBULATORY_CARE_PROVIDER_SITE_OTHER): Payer: Medicare Other | Admitting: Psychology

## 2018-08-03 DIAGNOSIS — F4323 Adjustment disorder with mixed anxiety and depressed mood: Secondary | ICD-10-CM | POA: Diagnosis not present

## 2018-08-08 NOTE — Telephone Encounter (Signed)
[facility] that tecfidera and ampyra shipped on 08-02-18.

## 2018-08-16 ENCOUNTER — Ambulatory Visit (INDEPENDENT_AMBULATORY_CARE_PROVIDER_SITE_OTHER): Payer: Medicare Other | Admitting: Psychology

## 2018-08-16 DIAGNOSIS — F4323 Adjustment disorder with mixed anxiety and depressed mood: Secondary | ICD-10-CM | POA: Diagnosis not present

## 2018-08-22 ENCOUNTER — Encounter: Payer: Self-pay | Admitting: Diagnostic Neuroimaging

## 2018-08-22 ENCOUNTER — Ambulatory Visit (INDEPENDENT_AMBULATORY_CARE_PROVIDER_SITE_OTHER): Payer: Medicare Other | Admitting: Diagnostic Neuroimaging

## 2018-08-22 VITALS — BP 144/83 | HR 85 | Ht 68.0 in | Wt 163.0 lb

## 2018-08-22 DIAGNOSIS — G35 Multiple sclerosis: Secondary | ICD-10-CM

## 2018-08-22 DIAGNOSIS — R269 Unspecified abnormalities of gait and mobility: Secondary | ICD-10-CM

## 2018-08-22 NOTE — Patient Instructions (Addendum)
-   recommend to stop tecfidera; consider to switch to [place], ocrevus or copaxone

## 2018-08-22 NOTE — Progress Notes (Signed)
PATIENT: [name] DOB: [birth year]   REASON FOR VISIT: follow up for MS HISTORY FROM: patient  Chief Complaint  Patient presents with  . Follow-up    1 year follow up. Alone. Rm 6. No new concerns at this time.      HISTORY OF PRESENT ILLNESS:  UPDATE (08/22/18, VRP): Since last visit, symptoms are slightly progressed (gait issues). No alleviating or aggravating factors. Tolerating tecfidera and ampyra. Staying active with swimming.  UPDATE (08/18/17, VRP): Since last visit, tolerating meds. No alleviating or aggravating factors. Ampyra helping somewhat. Having to lean on walker more.   UPDATE 08/18/16: Since last visit, tolerating meds. No new issues. Some slight subjective progression of symptoms. Had a fall recently with subsequent right forearm bruising.   UPDATE 02/11/16: Since last visit, tolerating tecfidera. Some slow progression. Asking about new MS medications.  UPDATE 08/13/15: Tolerating meds. More urinary issues. Using walker mainly.   UPDATE 04/16/15: Since last visit, patient is stable. Getting home visits with palliative care.   UPDATE 02/05/15: Discussed palliative care consult and options.   UPDATE 08/02/14: Since last visit, feels stable. Tolerating tecfidera and ampyra. No new events. Uses cane and walker.  Swimming more. Wife notes memory issues slightly worse.   UPDATE 01/31/14: Since last visit, continues on tecfidera. No progression of neuro sxs or MS sxs. Asking about driving again. Has not driven in last 3-4 months. Ongoing medical mgmt of his cardiac issues, and he is not a surgical candidate.  UPDATE 10/25/13 (LL): Patient comes in for revisit, since last visit he had worsening right-sided HF and was hospitalized.  He was seen by [name] for surgical consultation to consider tricuspid valve repair and possible Maze procedure.  He was referred to the advanced heart failure clinic who has been adjusting his lasix to manage his lower extremity edema  and shortness of breath.  He is to be seen by [name] again for follow up in another month, and says [name] felt that surgery was needed.  He comes in today to discuss with [name] how his MS would be affected by having the surgery.  His wife feels like he is having more difficulty walking since last visit, but he has not had any falls.  He is tolerating Tecfidera and Ampyra well.  UPDATE 07/19/13 (VP): Patient continues to have progressive deterioration in cognitive ability, memory, mood lability, gait stability. Patient is on tecfidera now, and tolerating without side effects. Patient feels when he came off of Betaseron he had some accelerated progression of MS that this has slowed down since starting tecfidera. Unclear whether patient's current level of functioning is on the same trajectory as the decline that was noted while patient was on Betaseron, or whether there has been some acceleration of deterioration. Other factors include some marital discord and comorbid depression. Other contributing factors include his cardiac issues including atrial fibrillation, on Coumadin, as well as severe tricuspid valve regurgitation leading to peripheral edema. Unfortunately patient is not felt to be a good surgical candidate.   UPDATE 01/13/13: 72 yo Caucasian gentleman who was previously [name] patient comes in today for follow up of MS. The patient is accompanied by his wife. Pt. States he is doing worse since his last visit in Feb. He is walking slower and having more difficulty standing from a seated position. 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 [name] had talked to him about changing MS medications and he thinks he wants  to. Having more memory problems. Continuing on betaseron right now.   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 with a history of multiple sclerosis  diagnosed in September 1996. He has been on Betaseron since 1996 and has right leg spasticity requiring him to use a cane in his left hand.He does not have any significant side effects from the Betaseron. Blood studies 02/10/2012 are normal CBC and CMP except for a low platelet count of 63K, Hgb 12.1 and alk Phos of 253. This is being followed by [name] and [name]. He has never had lymphopenia or neutropenia. There is no change in his symptoms. He denies bowel or bladder incontinence, weakness, Lhermitte's sign, or double vision. He swims 3 times per week for 10 laps which is a little over a quarter of a mile, claims he is winded afterwards. He has not fallen, uses a cane in his left hand. MRI of the brain and cervical spine with and without contrast enhancement 03/26/2010 showed multiple subcortical, periventricular, and brainstem white matter hyperintensities without enhancing lesions present. There were T1 black holes and atrophy present. The cervical MRI showed a large central disc protrusion at C5-6 and spinal cord hyperintensities at C4 and brain stem representing remote demyelinating plaques without enhancing lesions present. Ampyra was started November 2012 with improvement in gait. He was pleased with the addition of the drug and his response. Pt denies injection site problems however he says he is tired of shots. He has been on [employer] since 1996 and we have talked about whether changing to an oral agent should be considered. He had several falls with his right leg giving way on standing. He has right knee pain. He states his memory is worse. He denies numbness delusions or depression. 07/11/2012= (MMSE29/30. Clock drawing task4/4. Animal fluency test 17). His wife has noted more problems walking which the patient is hesitant to discuss. He does admit since calling me for a work in appointment , that he believes he is having more difficulty walking. He uses a walker at home and is noticeably  slower. He uses the walker to get out of a bed that is low and out of a chair. He notices shortness of breath on swimming. His swallowing is okay except for occasional difficulty with liquids. He has right knee pain when walking. Betaseron seems to help his knee pain, walking, and his eyes after an injection. He is hesitant to go off of the medication for 3 weeks. He has increased spasms in his right foot and leg that are also moving to the left leg. He has swelling in his legs treated with furosemide.    REVIEW OF SYSTEMS: Full 14 system review of systems performed and negative except: as per HPI.   ALLERGIES: No Known Allergies  HOME MEDICATIONS: Outpatient Medications Prior to Visit  Medication Sig Dispense Refill  . AMPYRA 10 MG TB12 TAKE ONE TABLET BY MOUTH TWICE DAILY (APPROXIMATELY 12 HOURS APART). MAY BE TAKEN WITH OR WITHOUT FOOD. DO NOT CUT OR CRUSH. STORE AT ROOM T 180 tablet 3  . Cholecalciferol (VITAMIN D3) 2000 UNITS TABS Take 1 capsule by mouth daily.    Marland Kitchen docusate sodium (COLACE) 100 MG capsule Take 100 mg by mouth daily as needed for mild constipation.    Marland Kitchen ELIQUIS 5 MG TABS tablet TAKE 1 TABLET BY MOUTH TWICE DAILY. 180 tablet 1  . ferrous sulfate  325 (65 FE) MG tablet Take 1 tablet (325 mg total) by mouth 3 (three) times daily with meals. 90 tablet 1  . folic acid (FOLVITE) 1 MG tablet Take 1 mg by mouth daily.      . furosemide (LASIX) 40 MG tablet TAKE 1 TABLET BY MOUTH TWICE DAILY. MAY TAKE AN ADDITIONAL TABLETS INTHE P.M. AS NEEDED. 180 tablet 3  . losartan (COZAAR) 100 MG tablet TAKE 1 TABLET ONCE DAILY. 30 tablet 2  . Multiple Vitamin (MULTIVITAMIN WITH MINERALS) TABS tablet Take 1 tablet by mouth daily. Centrum for men.    Marland Kitchen QUEtiapine (SEROQUEL) 25 MG tablet Take 25 mg by mouth at bedtime as needed.     Marland Kitchen spironolactone (ALDACTONE) 25 MG tablet TAKE 1 TABLET EACH DAY. 30 tablet 2  . TECFIDERA 240 MG CPDR TAKE ONE CAPSULE BY MOUTH TWICE DAILY. STORE IN ORIGINAL  CONTAINER ATROOM TEMPERATURE. 180 capsule 4  . triamcinolone cream (KENALOG) 0.1 % Apply 1 application topically daily as needed (rash).      No facility-administered medications prior to visit.     PAST MEDICAL HISTORY: Past Medical History:  Diagnosis Date  . Anemia   . Aneurysm of aortic arch ([place]) 04/04/2012   Chronic aneurysmal dilatation of aortic arch with chronic type A aortic dissection, s/p replacement of ascending thoracic aorta  . Aortic dissection ([place]) 11/05/1998   S/P emergency repair of acute type A aortic dissection with resuspension of native aortic valve  . Aortic dissection, thoracic ([place])   . Aortic root aneurysm ([place])   . Atrial fibrillation ([place]) 01/30/2013   Atrial fibrillation   . Bilateral lower extremity edema   . BPH (benign prostatic hyperplasia)   . CHF (congestive heart failure) ([place]) [birth year]24[birth year]   2D Echo - EF 50-55%, mild-moderately dilated right ventricle, mild-moderate tricuspid valve regurgitation, moderately dilated right atrium  . Depression   . Dyslipidemia   . Exertional shortness of breath    "for awhile now" (09/11/2013)  . Hemorrhoids   . History of blood transfusion   . Hypertension   . Mitral regurgitation 02/20/2013  . Multiple sclerosis ([place])   . Neuromuscular disorder ([place])   . Pleural effusion, right, large 01/30/2013  . Severe tricuspid valve regurgitation 01/30/2013   Severe tricuspid regurg by recent 2-D echo with a dilated tricuspid annulus, moderate pulmonary hypertension and biatrial enlargement   . Thrombocytopenia ([place])     PAST SURGICAL HISTORY: Past Surgical History:  Procedure Laterality Date  . HEMORRHOID SURGERY  2009   Internal, external hemorrhoidectomy, general anesthesia,prone position. [Other]  . Open reduction and internal fixation of right distal radius fracture using [employer] distal radius volar locking plate.  07/2005   Dr Ninfa Linden  . Repair of acute type A aortic dissection with resuspension of native  aortic valve  10/15/1998   Dr [name]  . TEE WITHOUT CARDIOVERSION N/A 02/17/2013   Procedure: TRANSESOPHAGEAL ECHOCARDIOGRAM (TEE);  Surgeon: [name], MD;  Location: [facility] ENDOSCOPY;  Service: Cardiovascular;  Laterality: N/A;    FAMILY HISTORY: Family History  Problem Relation Age of Onset  . Thyroid cancer Father        smoked  . Heart attack Mother   . Lung cancer Brother        smoked  . Emphysema Brother        smoked    SOCIAL HISTORY: Social History   Socioeconomic History  . Marital status: Married    Spouse name: Jackelyn Poling  . Number of children: 2  .  Years of education: Ph.D  . Highest education level: Not on file  Occupational History  . 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 Financial resource strain: Not hard at all  . Food insecurity:    Worry: Never true    Inability: Never true  . Transportation needs:    Medical: No    Non-medical: No  Tobacco Use  . Smoking status: Never Smoker  . Smokeless tobacco: Never Used  Substance and Sexual Activity  . Alcohol use: Yes    Comment: 09/11/2013 "might have a drink once/month"  . Drug use: No  . Sexual activity: Not Currently  Lifestyle  . Physical activity:    Days per week: Not on file    Minutes per session: Not on file  . Stress: Not on file  Relationships  . Social connections:    Talks on phone: Not on file    Gets together: Not on file    Attends religious service: Not on file    Active member of club or organization: Not on file    Attends meetings of clubs or organizations: Not on file    Relationship status: Not on file  . Intimate partner violence:    Fear of current or ex partner: Not on file    Emotionally abused: Not on file    Physically abused: Not on file    Forced sexual activity: Not on file  Other Topics Concern  . Not on file  Social History Narrative   Patient lives at home with spouse.   Caffeine Use: eat a lot  of chocolate, sodas, coffee and tea occasionally     PHYSICAL EXAM  Vitals:   08/22/18 1407  BP: (!) 144/83  Pulse: 85  Weight: 163 lb (73.9 kg)  Height: 5' 8" (1.727 m)   Body mass index is 24.78 kg/m.  Generalized: Well developed, in no acute distress  Neck: Supple, no carotid bruits  Cardiac: REG RATE RHYTHM, SYS MURMUR  Neurological examination   MENTAL STATUS: awake, alert, language fluent, comprehension intact, naming intact   CRANIAL NERVE: pupils equal and reactive to light, visual fields full to confrontation, extraocular muscles: SACCADIC BREAKDOWN OF SMOOTH PURSUIT; END GAZE NYSTAGMUS; PAST POINTING ON SACCADES. Facial sensation and WITH DECR RIGHT EYE CLOSURE WEAKNESS AND RIGHT LOWER FACIAL STRENGTH. SYNKINESES ON RIGHT FACE. Uvula midline, shoulder shrug symmetric, tongue midline.   MOTOR: INCREASED TONE IN BLE > BUE (RIGHT > LEFT). BUE (DELTOID 4+, TRICEP 4, BICEP 5, GRIP 4+), RIGHT LEG (HF 3, KE 3, KF 2, DF 1-2), LEFT LEG (HF 3, KE/KF 3, DF 3)  SENSORY: DECR IN RLE TO ALL MODALITIES.   COORDINATION: finger-nose-finger, fine finger movements normal   REFLEXES: BUE 3 (R>L), RLE 3, LLE 2.   GAIT/STATION: DIFF STANDING FROM CHAIR. USES A WALKER. SPASTIC GAIT. RIGHT CIRCUMDUCTION. UNSTEADY.   DIAGNOSTIC DATA (LABS, IMAGING, TESTING) - I reviewed patient records, labs, notes, testing and imaging myself where available.  Lab Results  Component Value Date   WBC 5.7 06/18/2017   HGB 11.2 (L) 06/18/2017   HCT 34.4 (L) 06/18/2017   MCV 87.8 06/18/2017   PLT 97 (L) 06/18/2017   CMP Latest Ref Rng & Units 07/06/2018 06/18/2017 02/04/2017  Glucose 70 - 99 mg/dL 96 105(H) 95  BUN 8 - 23 mg/dL 36(H) 39(H) 40(H)  Creatinine 0.61 - 1.24 mg/dL 1.52(H) 1.53(H) 1.44(H)  Sodium 135 -  145 mmol/L 141 140 141  Potassium 3.5 - 5.1 mmol/L 4.1 3.6 4.1  Chloride 98 - 111 mmol/L 106 108 105  CO2 22 - 32 mmol/L _0 Calcium 8.9 - 10.3 mg/dL 9.9 9.1 9.8  Total Protein 6.5 -  8.1 g/dL - 6.2(L) -  Total Bilirubin 0.3 - 1.2 mg/dL - 0.6 -  Alkaline Phos 38 - 126 U/L - 96 -  AST 15 - 41 U/L - 23 -  ALT 17 - 63 U/L - 20 -   lymph#  Date Value Ref Range Status  02/10/2012 0.9 0.9 - 3.3 10e3/uL Final  08/10/2011 1.1 0.9 - 3.3 10e3/uL Final  03/12/2010 0.8 (L) 0.9 - 3.3 10e3/uL Final   Lymphocytes Absolute  Date Value Ref Range Status  02/11/2016 0.5 (L) 0.7 - 3.1 x10E3/uL Final  08/02/2014 0.5 (L) 0.7 - 3.1 x10E3/uL Final  01/31/2014 0.3 (L) 0.7 - 3.1 x10E3/uL Final   Lymphs Abs  Date Value Ref Range Status  06/18/2017 0.5 (L) 0.7 - 4.0 K/uL Final  02/08/2015 0.5 (L) 0.7 - 4.0 K/uL Final    11/16/12 MRI brain - There are multiple periventricular and subcortical and pontine chronic demyelinating plaques. There are several T1 black holes. Mild diffuse atrophy. No acute plaques. In comparison to MRI from 03/26/10, there is no significant change.   08/02/13 MRI brain - multiple brainstem, periventricular, corpus callosal and subcortical white matter hyperintensities compatible with chronic lesions of multiple sclerosis. No enhancing lesions are noted. The presence of T1 black holes and significant atrophy of corpus callosum and cortex indicates chronic disease.  11/16/12 MRI cervical spine - Multiple chronic demyelinating plaques at C2-3, C3-4 and C5-6. No acute plaques. At C5-6: Disc bulging with facet hypertrophy with moderate spinal stenosis, severe right and moderate left foraminal stenosis. At C3-4, C4-5: Disc bulging with mild spinal stenosis and biforaminal stenosis. In comparison to MRI from 03/26/10 there is no significant change.   01/20/13 JCV antibody - positive, index 3.37 (H)    ASSESSMENT AND PLAN  71 y.o. old male with Hypertension; Anemia; Depression; Multiple sclerosis; Thrombocytopenia; Dyslipidemia; Aortic dissection, thoracic; Aortic dissection (11/05/1998); Aneurysm of aortic arch (04/04/2012); Chest pain (11/18/2009); CHF (congestive heart failure),  Atrial fibrillation (01/30/2013); Pleural effusion, right, large (01/30/2013); Severe tricuspid valve regurgitation (01/30/2013); Mitral regurgitation (02/20/2013); and Lower extremity edema (02/20/2013) here with multiple sclerosis since 1996. Initially on betaseron, now on tecfidera.   Dx:  MS (multiple sclerosis) ([place]) - Plan: CBC with Differential/Platelet, Comprehensive metabolic panel, Stratify JCV(TM) Ab w/Index  Abnormality of gait  Multiple sclerosis ([place]) - Plan: MR BRAIN W WO CONTRAST    PLAN:  MULTIPLE SCLEROSIS - recommend to stop tecfidera; has had persistently low absolute lymphocytes, consider to switch to aubagio, ocrevus or copaxone; patient will think about it and return in 6 weeks to discuss - continue ampyra - continue swimming and PT exercises - high fall risk and on anti-coagulation for atrial fibrillation; precautions reviewed with patient  Orders Placed This Encounter  Procedures  . MR BRAIN W WO CONTRAST  . CBC with Differential/Platelet  . Comprehensive metabolic panel  . Stratify JCV(TM) Ab w/Index   Return in about 6 weeks (around 10/03/2018).     Penni Bombard, MD 23/11/74, 2:26 PM Certified in Neurology, Neurophysiology and Neuroimaging  [facility] [address] [place], [place] [address] [phone]

## 2018-08-22 NOTE — Progress Notes (Signed)
JCV lab specimen placed in [facility] lock box at 4:23 pm.

## 2018-08-23 ENCOUNTER — Ambulatory Visit (INDEPENDENT_AMBULATORY_CARE_PROVIDER_SITE_OTHER): Payer: Medicare Other | Admitting: Psychology

## 2018-08-23 ENCOUNTER — Telehealth: Payer: Self-pay | Admitting: Diagnostic Neuroimaging

## 2018-08-23 DIAGNOSIS — F4323 Adjustment disorder with mixed anxiety and depressed mood: Secondary | ICD-10-CM

## 2018-08-23 LAB — COMPREHENSIVE METABOLIC PANEL
ALT: 29 IU/L (ref 0–44)
AST: 25 IU/L (ref 0–40)
Albumin/Globulin Ratio: 1.7 (ref 1.2–2.2)
Albumin: 4.5 g/dL (ref 3.5–4.8)
Alkaline Phosphatase: 178 IU/L — ABNORMAL HIGH (ref 39–117)
BUN/Creatinine Ratio: 32 — ABNORMAL HIGH (ref 10–24)
BUN: 47 mg/dL — ABNORMAL HIGH (ref 8–27)
Bilirubin Total: 0.4 mg/dL (ref 0.0–1.2)
CO2: 24 mmol/L (ref 20–29)
Calcium: 9.9 mg/dL (ref 8.6–10.2)
Chloride: 101 mmol/L (ref 96–106)
Creatinine, Ser: 1.49 mg/dL — ABNORMAL HIGH (ref 0.76–1.27)
GFR calc Af Amer: 54 mL/min/{1.73_m2} — ABNORMAL LOW (ref >59)
GFR calc non Af Amer: 47 mL/min/{1.73_m2} — ABNORMAL LOW (ref >59)
Globulin, Total: 2.6 g/dL (ref 1.5–4.5)
Glucose: 103 mg/dL — ABNORMAL HIGH (ref 65–99)
Potassium: 4.7 mmol/L (ref 3.5–5.2)
Sodium: 141 mmol/L (ref 134–144)
Total Protein: 7.1 g/dL (ref 6.0–8.5)

## 2018-08-23 LAB — CBC WITH DIFFERENTIAL/PLATELET
Basophils Absolute: 0 10*3/uL (ref 0.0–0.2)
Basos: 1 %
EOS (ABSOLUTE): 0.1 10*3/uL (ref 0.0–0.4)
Eos: 1 %
Hematocrit: 37 % — ABNORMAL LOW (ref 37.5–51.0)
Hemoglobin: 12 g/dL — ABNORMAL LOW (ref 13.0–17.7)
Immature Grans (Abs): 0 10*3/uL (ref 0.0–0.1)
Immature Granulocytes: 0 %
Lymphocytes Absolute: 0.4 10*3/uL — ABNORMAL LOW (ref 0.7–3.1)
Lymphs: 8 %
MCH: 28.4 pg (ref 26.6–33.0)
MCHC: 32.4 g/dL (ref 31.5–35.7)
MCV: 88 fL (ref 79–97)
Monocytes Absolute: 0.5 10*3/uL (ref 0.1–0.9)
Monocytes: 12 %
Neutrophils Absolute: 3.5 10*3/uL (ref 1.4–7.0)
Neutrophils: 78 %
Platelets: 119 10*3/uL — ABNORMAL LOW (ref 150–450)
RBC: 4.23 x10E6/uL (ref 4.14–5.80)
RDW: 12.6 % (ref 12.3–15.4)
WBC: 4.5 10*3/uL (ref 3.4–10.8)

## 2018-08-23 NOTE — Telephone Encounter (Signed)
Medicare/BCBS Auth: [id number] (exp. 08/23/18 to 09/21/18) order sent to GI. They will reach out to the pt to schedule.

## 2018-08-23 NOTE — Telephone Encounter (Signed)
Spoke to the patient he is aware of this.

## 2018-08-24 ENCOUNTER — Telehealth: Payer: Self-pay | Admitting: *Deleted

## 2018-08-24 NOTE — Telephone Encounter (Signed)
LVM informing the patient that his labs show his lymphocytes are still low. Informed him Dr [name] recommends he stop taking Tecfidera. Reminded him of his 6 week FU and left number for any questions.

## 2018-08-26 NOTE — Progress Notes (Signed)
JCV resulted: POSITIVE Index 3.76 08-25-18 sy

## 2018-08-29 ENCOUNTER — Ambulatory Visit (INDEPENDENT_AMBULATORY_CARE_PROVIDER_SITE_OTHER): Payer: Medicare Other | Admitting: Psychology

## 2018-08-29 DIAGNOSIS — F4323 Adjustment disorder with mixed anxiety and depressed mood: Secondary | ICD-10-CM

## 2018-09-02 ENCOUNTER — Other Ambulatory Visit: Payer: Self-pay | Admitting: Diagnostic Neuroimaging

## 2018-09-02 NOTE — Progress Notes (Signed)
Stopping tecfidera due to persistent low lymphocytes. -VRP

## 2018-09-12 ENCOUNTER — Other Ambulatory Visit (HOSPITAL_COMMUNITY): Payer: Self-pay | Admitting: Internal Medicine

## 2018-09-19 NOTE — Telephone Encounter (Signed)
Updated [name]: [id number] (exp. 09/19/18 to 10/18/18) patient is scheduled at GI for 09/22/18

## 2018-09-22 ENCOUNTER — Ambulatory Visit
Admission: RE | Admit: 2018-09-22 | Discharge: 2018-09-22 | Disposition: A | Discharge status: Home / Self Care | Payer: Medicare Other | Source: Ambulatory Visit | Attending: Diagnostic Neuroimaging | Admitting: Diagnostic Neuroimaging

## 2018-09-22 DIAGNOSIS — G35 Multiple sclerosis: Secondary | ICD-10-CM

## 2018-09-22 MED ORDER — GADOBENATE DIMEGLUMINE 529 MG/ML IV SOLN
15.0000 mL | Freq: Once | INTRAVENOUS | Status: AC | PRN
  Administered 2018-09-22: 15 mL via INTRAVENOUS

## 2018-09-28 ENCOUNTER — Telehealth: Payer: Self-pay | Admitting: Neurology

## 2018-09-28 ENCOUNTER — Encounter: Payer: Self-pay | Admitting: Neurology

## 2018-09-28 NOTE — Telephone Encounter (Signed)
Called the patient and there was no answer. LVM informing the patient the MRI images were stable and there was nothing new present. Advised the patient to call if there were any questions. Will also send a my chart message.

## 2018-09-28 NOTE — Telephone Encounter (Signed)
-----   Message from Penni Bombard, MD sent at 09/28/2018  1:18 PM EST ----- Stable imaging. Please call patient. Continue current plan. -VRP

## 2018-09-28 NOTE — Telephone Encounter (Signed)
Pt returned call and I was able to review with him the mri results. Pt verbalized understanding. Pt had no questions at this time but was encouraged to call back if questions arise.

## 2018-10-05 ENCOUNTER — Ambulatory Visit (INDEPENDENT_AMBULATORY_CARE_PROVIDER_SITE_OTHER): Payer: Medicare Other | Admitting: Diagnostic Neuroimaging

## 2018-10-05 ENCOUNTER — Ambulatory Visit (HOSPITAL_COMMUNITY)
Admission: RE | Admit: 2018-10-05 | Discharge: 2018-10-05 | Disposition: A | Discharge status: Home / Self Care | Payer: Medicare Other | Source: Ambulatory Visit | Attending: Cardiology | Admitting: Cardiology

## 2018-10-05 ENCOUNTER — Encounter: Payer: Self-pay | Admitting: Diagnostic Neuroimaging

## 2018-10-05 VITALS — BP 128/73 | HR 75 | Ht 68.0 in | Wt 165.0 lb

## 2018-10-05 DIAGNOSIS — R269 Unspecified abnormalities of gait and mobility: Secondary | ICD-10-CM | POA: Diagnosis not present

## 2018-10-05 DIAGNOSIS — I5032 Chronic diastolic (congestive) heart failure: Secondary | ICD-10-CM

## 2018-10-05 DIAGNOSIS — I083 Combined rheumatic disorders of mitral, aortic and tricuspid valves: Secondary | ICD-10-CM | POA: Insufficient documentation

## 2018-10-05 DIAGNOSIS — G35 Multiple sclerosis: Secondary | ICD-10-CM | POA: Insufficient documentation

## 2018-10-05 LAB — ECHOCARDIOGRAM COMPLETE
Height: 68 in
Weight: 2640 oz

## 2018-10-05 NOTE — Progress Notes (Signed)
PATIENT: [name] DOB: [birth year]   REASON FOR VISIT: follow up for MS HISTORY FROM: patient  Chief Complaint  Patient presents with  . Follow-up    Rm 7, alone  . Multiple Sclerosis    discuss stopping tecfidera. Using walker.       HISTORY OF PRESENT ILLNESS:  UPDATE (10/05/18, VRP): Since last visit, doing about the same. Symptoms are stable. Patient wants to consider staying on tecfidera, in spite of low lymphocyte count and risk of PML. No alleviating or aggravating factors.  UPDATE (08/22/18, VRP): Since last visit, symptoms are slightly progressed (gait issues). No alleviating or aggravating factors. Tolerating tecfidera and ampyra. Staying active with swimming.  UPDATE (08/18/17, VRP): Since last visit, tolerating meds. No alleviating or aggravating factors. Ampyra helping somewhat. Having to lean on walker more.   UPDATE 08/18/16: Since last visit, tolerating meds. No new issues. Some slight subjective progression of symptoms. Had a fall recently with subsequent right forearm bruising.   UPDATE 02/11/16: Since last visit, tolerating tecfidera. Some slow progression. Asking about new MS medications.  UPDATE 08/13/15: Tolerating meds. More urinary issues. Using walker mainly.   UPDATE 04/16/15: Since last visit, patient is stable. Getting home visits with palliative care.   UPDATE 02/05/15: Discussed palliative care consult and options.   UPDATE 08/02/14: Since last visit, feels stable. Tolerating tecfidera and ampyra. No new events. Uses cane and walker.  Swimming more. Wife notes memory issues slightly worse.   UPDATE 01/31/14: Since last visit, continues on tecfidera. No progression of neuro sxs or MS sxs. Asking about driving again. Has not driven in last 3-4 months. Ongoing medical mgmt of his cardiac issues, and he is not a surgical candidate.  UPDATE 10/25/13 (LL): Patient comes in for revisit, since last visit he had worsening right-sided HF and was  hospitalized.  He was seen by [name] for surgical consultation to consider tricuspid valve repair and possible Maze procedure.  He was referred to the advanced heart failure clinic who has been adjusting his lasix to manage his lower extremity edema and shortness of breath.  He is to be seen by [name] again for follow up in another month, and says [name] felt that surgery was needed.  He comes in today to discuss with [name] how his MS would be affected by having the surgery.  His wife feels like he is having more difficulty walking since last visit, but he has not had any falls.  He is tolerating Tecfidera and Ampyra well.  UPDATE 07/19/13 (VP): Patient continues to have progressive deterioration in cognitive ability, memory, mood lability, gait stability. Patient is on tecfidera now, and tolerating without side effects. Patient feels when he came off of Betaseron he had some accelerated progression of MS that this has slowed down since starting tecfidera. Unclear whether patient's current level of functioning is on the same trajectory as the decline that was noted while patient was on Betaseron, or whether there has been some acceleration of deterioration. Other factors include some marital discord and comorbid depression. Other contributing factors include his cardiac issues including atrial fibrillation, on Coumadin, as well as severe tricuspid valve regurgitation leading to peripheral edema. Unfortunately patient is not felt to be a good surgical candidate.   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 The patient is accompanied by his wife. Pt. States he is doing worse since his last  visit in 72. He is walking slower and having more difficulty standing from a seated position. 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 [name] had talked to him about changing MS medications and he thinks he wants to. Having more  memory problems. Continuing on betaseron right now.   PRIOR HPI (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 with a history of multiple sclerosis diagnosed in September 1996. He has been on Betaseron since 1996 and has right leg spasticity requiring him to use a cane in his left hand.He does not have any significant side effects from the Betaseron. Blood studies 02/10/2012 are normal CBC and CMP except for a low platelet count of 63K, Hgb 12.1 and alk Phos of 253. This is being followed by [name] and [name]. He has never had lymphopenia or neutropenia. There is no change in his symptoms. He denies bowel or bladder incontinence, weakness, Lhermitte's sign, or double vision. He swims 3 times per week for 10 laps which is a little over a quarter of a mile, claims he is winded afterwards. He has not fallen, uses a cane in his left hand. MRI of the brain and cervical spine with and without contrast enhancement 03/26/2010 showed multiple subcortical, periventricular, and brainstem white matter hyperintensities without enhancing lesions present. There were T1 black holes and atrophy present. The cervical MRI showed a large central disc protrusion at C5-6 and spinal cord hyperintensities at C4 and brain stem representing remote demyelinating plaques without enhancing lesions present. Ampyra was started November 2012 with improvement in gait. He was pleased with the addition of the drug and his response. Pt denies injection site problems however he says he is tired of shots. He has been on [employer] since 1996 and we have talked about whether changing to an oral agent should be considered. He had several falls with his right leg giving way on standing. He has right knee pain. He states his memory is worse. He denies numbness delusions or depression. 07/11/2012= (MMSE29/30. Clock drawing task4/4. Animal fluency test  17). His wife has noted more problems walking which the patient is hesitant to discuss. He does admit since calling me for a work in appointment , that he believes he is having more difficulty walking. He uses a walker at home and is noticeably slower. He uses the walker to get out of a bed that is low and out of a chair. He notices shortness of breath on swimming. His swallowing is okay except for occasional difficulty with liquids. He has right knee pain when walking. Betaseron seems to help his knee pain, walking, and his eyes after an injection. He is hesitant to go off of the medication for 3 weeks. He has increased spasms in his right foot and leg that are also moving to the left leg. He has swelling in his legs treated with furosemide.    REVIEW OF SYSTEMS: Full 14 system review of systems performed and negative except: as per HPI.   ALLERGIES: No Known Allergies  HOME MEDICATIONS: Outpatient Medications Prior to Visit  Medication Sig Dispense Refill  . AMPYRA 10 MG TB12 TAKE ONE TABLET BY MOUTH TWICE DAILY (APPROXIMATELY 12 HOURS APART). MAY BE TAKEN WITH OR WITHOUT FOOD. DO NOT CUT OR CRUSH. STORE AT ROOM T 180 tablet 3  . Cholecalciferol (VITAMIN D3) 2000 UNITS TABS Take 1 capsule by mouth daily.    . Dimethyl  Fumarate (TECFIDERA) 240 MG CPDR Take 1 capsule by mouth 2 (two) times daily.    Marland Kitchen docusate sodium (COLACE) 100 MG capsule Take 100 mg by mouth daily as needed for mild constipation.    Marland Kitchen ELIQUIS 5 MG TABS tablet TAKE 1 TABLET BY MOUTH TWICE DAILY. 180 tablet 1  . ferrous sulfate 325 (65 FE) MG tablet Take 1 tablet (325 mg total) by mouth 3 (three) times daily with meals. 90 tablet 1  . folic acid (FOLVITE) 1 MG tablet Take 1 mg by mouth daily.      . furosemide (LASIX) 40 MG tablet TAKE 1 TABLET BY MOUTH TWICE DAILY. MAY TAKE AN ADDITIONAL TABLETS INTHE P.M. AS NEEDED. 180 tablet 3  . losartan (COZAAR) 100 MG tablet TAKE 1 TABLET ONCE DAILY. 30 tablet 11  . Multiple Vitamin  (MULTIVITAMIN WITH MINERALS) TABS tablet Take 1 tablet by mouth daily. Centrum for men.    Marland Kitchen QUEtiapine (SEROQUEL) 25 MG tablet Take 25 mg by mouth at bedtime as needed.     Marland Kitchen spironolactone (ALDACTONE) 25 MG tablet TAKE 1 TABLET EACH DAY. 30 tablet 2  . triamcinolone cream (KENALOG) 0.1 % Apply 1 application topically daily as needed (rash).      No facility-administered medications prior to visit.     PAST MEDICAL HISTORY: Past Medical History:  Diagnosis Date  . Anemia   . Aneurysm of aortic arch ([place]) 04/04/2012   Chronic aneurysmal dilatation of aortic arch with chronic type A aortic dissection, s/p replacement of ascending thoracic aorta  . Aortic dissection ([place]) 11/05/1998   S/P emergency repair of acute type A aortic dissection with resuspension of native aortic valve  . Aortic dissection, thoracic ([place])   . Aortic root aneurysm ([place])   . Atrial fibrillation ([place]) 01/30/2013   Atrial fibrillation   . Bilateral lower extremity edema   . BPH (benign prostatic hyperplasia)   . CHF (congestive heart failure) ([place]) 12-24-1945   2D Echo - EF 50-55%, mild-moderately dilated right ventricle, mild-moderate tricuspid valve regurgitation, moderately dilated right atrium  . Depression   . Dyslipidemia   . Exertional shortness of breath    "for awhile now" (09/11/2013)  . Hemorrhoids   . History of blood transfusion   . Hypertension   . Mitral regurgitation 02/20/2013  . Multiple sclerosis ([place])   . Neuromuscular disorder ([place])   . Pleural effusion, right, large 01/30/2013  . Severe tricuspid valve regurgitation 01/30/2013   Severe tricuspid regurg by recent 2-D echo with a dilated tricuspid annulus, moderate pulmonary hypertension and biatrial enlargement   . Thrombocytopenia ([place])     PAST SURGICAL HISTORY: Past Surgical History:  Procedure Laterality Date  . HEMORRHOID SURGERY  2009   Internal, external hemorrhoidectomy, general anesthesia,prone position. [Other]  . Open reduction  and internal fixation of right distal radius fracture using [employer] distal radius volar locking plate.  07/2005   Dr Ninfa Linden  . Repair of acute type A aortic dissection with resuspension of native aortic valve  10/15/1998   Dr Roxy Manns  . TEE WITHOUT CARDIOVERSION N/A 02/17/2013   Procedure: TRANSESOPHAGEAL ECHOCARDIOGRAM (TEE);  Surgeon: [name], MD;  Location: [facility] ENDOSCOPY;  Service: Cardiovascular;  Laterality: N/A;    FAMILY HISTORY: Family History  Problem Relation Age of Onset  . Thyroid cancer Father        smoked  . Heart attack Mother   . Lung cancer Brother        smoked  . Emphysema Brother  smoked    SOCIAL HISTORY: Social History   Socioeconomic History  . Marital status: Married    Spouse name: Jackelyn Poling  . Number of children: 2  . Years of education: Ph.D  . Highest education level: Not on file  Occupational History  . 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 Financial resource strain: Not hard at all  . Food insecurity:    Worry: Never true    Inability: Never true  . Transportation needs:    Medical: No    Non-medical: No  Tobacco Use  . Smoking status: Never Smoker  . Smokeless tobacco: Never Used  Substance and Sexual Activity  . Alcohol use: Yes    Comment: 09/11/2013 "might have a drink once/month"  . Drug use: No  . Sexual activity: Not Currently  Lifestyle  . Physical activity:    Days per week: Not on file    Minutes per session: Not on file  . Stress: Not on file  Relationships  . Social connections:    Talks on phone: Not on file    Gets together: Not on file    Attends religious service: Not on file    Active member of club or organization: Not on file    Attends meetings of clubs or organizations: Not on file    Relationship status: Not on file  . Intimate partner violence:    Fear of current or ex partner: Not on file    Emotionally abused: Not on  file    Physically abused: Not on file    Forced sexual activity: Not on file  Other Topics Concern  . Not on file  Social History Narrative   Patient lives at home with spouse.   Caffeine Use: eat a lot of chocolate, sodas, coffee and tea occasionally     PHYSICAL EXAM  Vitals:   10/05/18 1253  BP: 128/73  Pulse: 75  Weight: 165 lb (74.8 kg)  Height: _0  (1.727 m)   Body mass index is 25.09 kg/m.  Generalized: Well developed, in no acute distress  Neck: Supple, no carotid bruits  Cardiac: REG RATE RHYTHM, SYS MURMUR  Neurological examination   MENTAL STATUS: awake, alert, language fluent, comprehension intact, naming intact   CRANIAL NERVE: pupils equal and reactive to light, visual fields full to confrontation, extraocular muscles: SACCADIC BREAKDOWN OF SMOOTH PURSUIT; END GAZE NYSTAGMUS; PAST POINTING ON SACCADES. Facial sensation and WITH DECR RIGHT EYE CLOSURE WEAKNESS AND RIGHT LOWER FACIAL STRENGTH. SYNKINESES ON RIGHT FACE. Uvula midline, shoulder shrug symmetric, tongue midline.   MOTOR: INCREASED TONE IN BLE > BUE (RIGHT > LEFT). BUE (DELTOID 4+, TRICEP 4, BICEP 5, GRIP 4+), RIGHT LEG (HF 3, KE 3, KF 2, DF 1-2), LEFT LEG (HF 3, KE/KF 3, DF 3)  SENSORY: DECR IN RLE TO ALL MODALITIES.   COORDINATION: finger-nose-finger, fine finger movements normal   REFLEXES: BUE 3 (R>L), RLE 3, LLE 2.   GAIT/STATION: DIFF STANDING FROM CHAIR. USES A WALKER. SPASTIC GAIT. RIGHT CIRCUMDUCTION. UNSTEADY.   DIAGNOSTIC DATA (LABS, IMAGING, TESTING) - I reviewed patient records, labs, notes, testing and imaging myself where available.  Lab Results  Component Value Date   WBC 4.5 08/22/2018   HGB 12.0 (L) 08/22/2018   HCT 37.0 (L) 08/22/2018   MCV 88 08/22/2018   PLT 119 (L) 08/22/2018   CMP Latest Ref Rng & Units 08/22/2018 07/06/2018 06/18/2017  Glucose 65 - 99 mg/dL 103(H) 96 105(H)  BUN 8 - 27 mg/dL 47(H) 36(H) 39(H)  Creatinine 0.76 - 1.27 mg/dL 1.49(H) 1.52(H)  1.53(H)  Sodium 134 - 144 mmol/L 141 141 140  Potassium 3.5 - 5.2 mmol/L 4.7 4.1 3.6  Chloride 96 - 106 mmol/L 101 106 108  CO2 20 - 29 mmol/L _0 Calcium 8.6 - 10.2 mg/dL 9.9 9.9 9.1  Total Protein 6.0 - 8.5 g/dL 7.1 - 6.2(L)  Total Bilirubin 0.0 - 1.2 mg/dL 0.4 - 0.6  Alkaline Phos 39 - 117 IU/L 178(H) - 96  AST 0 - 40 IU/L 25 - 23  ALT 0 - 44 IU/L 29 - 20   lymph#  Date Value Ref Range Status  02/10/2012 0.9 0.9 - 3.3 10e3/uL Final  08/10/2011 1.1 0.9 - 3.3 10e3/uL Final  03/12/2010 0.8 (L) 0.9 - 3.3 10e3/uL Final   Lymphocytes Absolute  Date Value Ref Range Status  08/22/2018 0.4 (L) 0.7 - 3.1 x10E3/uL Final  02/11/2016 0.5 (L) 0.7 - 3.1 x10E3/uL Final  08/02/2014 0.5 (L) 0.7 - 3.1 x10E3/uL Final   Lymphs Abs  Date Value Ref Range Status  06/18/2017 0.5 (L) 0.7 - 4.0 K/uL Final  02/08/2015 0.5 (L) 0.7 - 4.0 K/uL Final    11/16/12 MRI brain - There are multiple periventricular and subcortical and pontine chronic demyelinating plaques. There are several T1 black holes. Mild diffuse atrophy. No acute plaques. In comparison to MRI from 03/26/10, there is no significant change.   08/02/13 MRI brain - multiple brainstem, periventricular, corpus callosal and subcortical white matter hyperintensities compatible with chronic lesions of multiple sclerosis. No enhancing lesions are noted. The presence of T1 black holes and significant atrophy of corpus callosum and cortex indicates chronic disease.  11/16/12 MRI cervical spine - Multiple chronic demyelinating plaques at C2-3, C3-4 and C5-6. No acute plaques. At C5-6: Disc bulging with facet hypertrophy with moderate spinal stenosis, severe right and moderate left foraminal stenosis. At C3-4, C4-5: Disc bulging with mild spinal stenosis and biforaminal stenosis. In comparison to MRI from 03/26/10 there is no significant change.   01/20/13 JCV antibody - positive, index 3.37 (H)    ASSESSMENT AND PLAN  73 y.o. old male with  Hypertension; Anemia; Depression; Multiple sclerosis; Thrombocytopenia; Dyslipidemia; Aortic dissection, thoracic; Aortic dissection (11/05/1998); Aneurysm of aortic arch (04/04/2012); Chest pain (11/18/2009); CHF (congestive heart failure), Atrial fibrillation (01/30/2013); Pleural effusion, right, large (01/30/2013); Severe tricuspid valve regurgitation (01/30/2013); Mitral regurgitation (02/20/2013); and Lower extremity edema (02/20/2013) here with multiple sclerosis since 1996. Initially on betaseron, now on tecfidera.   Dx:  MS (multiple sclerosis) ([place])  Abnormality of gait    PLAN:  MULTIPLE SCLEROSIS - I recommend to stop tecfidera; has had persistently low absolute lymphocytes; he will switch to [place] - continue ampyra - continue swimming and PT exercises - high fall risk and on anti-coagulation for atrial fibrillation; precautions reviewed with patient   Return in about 6 months (around 04/05/2019).     Penni Bombard, MD 8/97/8478, 4:12 PM Certified in Neurology, Neurophysiology and Neuroimaging  [facility] [address], [address] [place], [place] [address] [phone]

## 2018-10-05 NOTE — Progress Notes (Signed)
  Echocardiogram 2D Echocardiogram has been performed.  [name] 10/05/2018, 3:16 PM

## 2018-10-07 ENCOUNTER — Encounter: Payer: Self-pay | Admitting: Diagnostic Neuroimaging

## 2018-10-14 ENCOUNTER — Telehealth: Payer: Self-pay | Admitting: *Deleted

## 2018-10-14 NOTE — Telephone Encounter (Signed)
Received fax from [place]: Aubagio approved 10/13/18 - 10/04/2019, [place]  health plan, [id number]. Approval letter faxed to [place] one to one.

## 2018-10-14 NOTE — Telephone Encounter (Signed)
[facility] re: [name] referral received. Benefits being verified.

## 2018-10-18 ENCOUNTER — Ambulatory Visit (INDEPENDENT_AMBULATORY_CARE_PROVIDER_SITE_OTHER): Payer: Medicare Other | Admitting: Psychology

## 2018-10-18 DIAGNOSIS — F4323 Adjustment disorder with mixed anxiety and depressed mood: Secondary | ICD-10-CM

## 2018-10-20 ENCOUNTER — Telehealth: Payer: Self-pay | Admitting: *Deleted

## 2018-10-20 NOTE — Telephone Encounter (Signed)
Called patient and spoke with wife, [name]. Advised her Dr [name] wants patient to get repeat lab in 3 weeks before he starts Aubagio. I advised no appointment necessary and gave her office lab hours. She stated she would let her husband know, verbalized understanding.

## 2018-10-25 ENCOUNTER — Telehealth: Payer: Self-pay | Admitting: *Deleted

## 2018-10-25 MED ORDER — AMPYRA 10 MG PO TB12: ORAL_TABLET | ORAL | 3 refills | Status: DC

## 2018-10-25 NOTE — Telephone Encounter (Signed)
Received Ampyra PA request from [place] .  Per Dr [name], patent may take generic form. Will send in [facility] since patient can be switched to generic. PA form completed, faxed to [place].  [facility].

## 2018-10-26 NOTE — Telephone Encounter (Addendum)
Received fax from [facility]: dalfampridine (Ampyra) approved 10/25/18 - 10/26/2019. Approval letter [facility]ed [facility].

## 2018-10-31 ENCOUNTER — Other Ambulatory Visit: Payer: Self-pay | Admitting: *Deleted

## 2018-10-31 DIAGNOSIS — G35 Multiple sclerosis: Secondary | ICD-10-CM

## 2018-10-31 DIAGNOSIS — Z79899 Other long term (current) drug therapy: Secondary | ICD-10-CM

## 2018-10-31 NOTE — Progress Notes (Signed)
b

## 2018-10-31 NOTE — Progress Notes (Signed)
C-

## 2018-11-01 ENCOUNTER — Telehealth: Payer: Self-pay | Admitting: Diagnostic Neuroimaging

## 2018-11-01 NOTE — Telephone Encounter (Signed)
[facility] [phone] needs to know if TB test was done and what the results were prior to starting aubagio. Please call to advise

## 2018-11-01 NOTE — Telephone Encounter (Signed)
Called [facility], [phone] spoke with Andee Poles and advised the patient will have CBC W/diff/platelets, hepatic function panel, TB test drawn on 11/10/18. Patient understands he will not start Aubagio until after lab results are in, and Dr [name] advises he can start.  She stated she made notes and to fax all lab results to  [phone].

## 2018-11-07 ENCOUNTER — Other Ambulatory Visit (INDEPENDENT_AMBULATORY_CARE_PROVIDER_SITE_OTHER): Payer: Self-pay

## 2018-11-07 ENCOUNTER — Other Ambulatory Visit: Payer: Self-pay | Admitting: Diagnostic Neuroimaging

## 2018-11-07 DIAGNOSIS — Z79899 Other long term (current) drug therapy: Secondary | ICD-10-CM

## 2018-11-07 DIAGNOSIS — G35 Multiple sclerosis: Secondary | ICD-10-CM

## 2018-11-07 DIAGNOSIS — Z0289 Encounter for other administrative examinations: Secondary | ICD-10-CM

## 2018-11-08 ENCOUNTER — Other Ambulatory Visit: Payer: Self-pay | Admitting: *Deleted

## 2018-11-08 ENCOUNTER — Telehealth: Payer: Self-pay | Admitting: *Deleted

## 2018-11-08 DIAGNOSIS — I71019 Dissection of thoracic aorta, unspecified: Secondary | ICD-10-CM

## 2018-11-08 DIAGNOSIS — I7101 Dissection of thoracic aorta: Secondary | ICD-10-CM

## 2018-11-08 NOTE — Telephone Encounter (Signed)
Spoke with patient and informed him his labs show his lymphocytes are still low. Dr [name] will repeat labs in 2-4 weeks. I advised the patient his count is 0.3 and needs to be >0.5 before starting aubagio. I advised will call and remind him of repeat lab in 2-4 weeks. Patient verbalized understanding, appreciation.

## 2018-11-08 NOTE — Addendum Note (Signed)
Addended by: Andrey Spearman R on: 11/08/2018 10:31 AM   Modules accepted: Orders

## 2018-11-09 ENCOUNTER — Ambulatory Visit (INDEPENDENT_AMBULATORY_CARE_PROVIDER_SITE_OTHER): Payer: Medicare Other | Admitting: Psychology

## 2018-11-09 DIAGNOSIS — F4323 Adjustment disorder with mixed anxiety and depressed mood: Secondary | ICD-10-CM

## 2018-11-09 LAB — CBC WITH DIFFERENTIAL/PLATELET
Basophils Absolute: 0 10*3/uL (ref 0.0–0.2)
Basos: 1 %
EOS (ABSOLUTE): 0.1 10*3/uL (ref 0.0–0.4)
Eos: 2 %
Hematocrit: 36 % — ABNORMAL LOW (ref 37.5–51.0)
Hemoglobin: 12.2 g/dL — ABNORMAL LOW (ref 13.0–17.7)
Immature Grans (Abs): 0 10*3/uL (ref 0.0–0.1)
Immature Granulocytes: 0 %
Lymphocytes Absolute: 0.3 10*3/uL — ABNORMAL LOW (ref 0.7–3.1)
Lymphs: 7 %
MCH: 28.8 pg (ref 26.6–33.0)
MCHC: 33.9 g/dL (ref 31.5–35.7)
MCV: 85 fL (ref 79–97)
Monocytes Absolute: 0.4 10*3/uL (ref 0.1–0.9)
Monocytes: 9 %
Neutrophils Absolute: 3.2 10*3/uL (ref 1.4–7.0)
Neutrophils: 81 %
Platelets: 132 10*3/uL — ABNORMAL LOW (ref 150–450)
RBC: 4.24 x10E6/uL (ref 4.14–5.80)
RDW: 12.6 % (ref 11.6–15.4)
WBC: 4 10*3/uL (ref 3.4–10.8)

## 2018-11-09 LAB — HEPATIC FUNCTION PANEL
ALT: 33 IU/L (ref 0–44)
AST: 30 IU/L (ref 0–40)
Albumin: 4.6 g/dL (ref 3.7–4.7)
Alkaline Phosphatase: 185 IU/L — ABNORMAL HIGH (ref 39–117)
Bilirubin Total: 0.6 mg/dL (ref 0.0–1.2)
Bilirubin, Direct: 0.17 mg/dL (ref 0.00–0.40)
Total Protein: 7.6 g/dL (ref 6.0–8.5)

## 2018-11-09 LAB — QUANTIFERON-TB GOLD PLUS
QuantiFERON Mitogen Value: 0.27 IU/mL
QuantiFERON Nil Value: 0.03 IU/mL
QuantiFERON TB1 Ag Value: 0.03 IU/mL
QuantiFERON TB2 Ag Value: 0.03 IU/mL
QuantiFERON-TB Gold Plus: UNDETERMINED — AB

## 2018-11-11 ENCOUNTER — Telehealth: Payer: Self-pay | Admitting: Diagnostic Neuroimaging

## 2018-11-11 NOTE — Telephone Encounter (Signed)
[facility] [phone] to be transferred to pharmacist) called inquiring if TB were done on 2/27. Please call to advise

## 2018-11-14 ENCOUNTER — Other Ambulatory Visit: Payer: Self-pay | Admitting: *Deleted

## 2018-11-14 ENCOUNTER — Encounter: Payer: Self-pay | Admitting: *Deleted

## 2018-11-14 DIAGNOSIS — G35 Multiple sclerosis: Secondary | ICD-10-CM

## 2018-11-14 DIAGNOSIS — Z79899 Other long term (current) drug therapy: Secondary | ICD-10-CM

## 2018-11-14 NOTE — Telephone Encounter (Signed)
Repeat TB lab order entered, to be done when patient gets repeat CBC, 2-4 weeks form previous lab draw on 11/07/18.

## 2018-11-14 NOTE — Telephone Encounter (Addendum)
Received call from Daniel ,[place] one to one Aubagio site coordinator. She stated a nurse spoke with patient today and he said he hadn't started Aubagio, didn't know if he ever would. I advised her that his lymphocyte count is too low, and Dr [name] also wants to repeat TB test when he repeats CBC in 2-4 weeks. I advised her I spoke [facility] today and advised them of same. She stated the Aubagio new start form will be good for 3 months, will wait to hear back from me after repeat labs are done.  My chart sent to patient to advise him of repeat TB lab when he gets CBC. Will advise he come the week of March 16th if possible.

## 2018-11-14 NOTE — Telephone Encounter (Signed)
Repeat test with next CBC. -VRP

## 2018-11-14 NOTE — Telephone Encounter (Signed)
Received call from Apolonio Schneiders, [facility] pharmacy asking if patient had TB test done. I advised her did on 11/07/18, results not released by provider. I advised her the patient cannot start aubagio because his lymphocytes are 0.3. Per Dr [name] they must be > 0.5. I advised Apolonio Schneiders the patient is aware and is to return in 2-4 weeks for repeat CBC to check lymphocytes again.  She will flag his chart and aubagio Rx to wait on repeat CBC and TB test results. She verbalized understanding, appreciation.

## 2018-11-18 ENCOUNTER — Other Ambulatory Visit: Payer: Self-pay | Admitting: Cardiovascular Disease

## 2018-11-21 ENCOUNTER — Other Ambulatory Visit (INDEPENDENT_AMBULATORY_CARE_PROVIDER_SITE_OTHER): Payer: Self-pay

## 2018-11-21 DIAGNOSIS — Z0289 Encounter for other administrative examinations: Secondary | ICD-10-CM

## 2018-11-21 DIAGNOSIS — Z79899 Other long term (current) drug therapy: Secondary | ICD-10-CM

## 2018-11-21 DIAGNOSIS — G35 Multiple sclerosis: Secondary | ICD-10-CM

## 2018-11-24 ENCOUNTER — Telehealth: Payer: Self-pay | Admitting: *Deleted

## 2018-11-24 LAB — CBC WITH DIFFERENTIAL/PLATELET
Basophils Absolute: 0 10*3/uL (ref 0.0–0.2)
Basos: 1 %
EOS (ABSOLUTE): 0.1 10*3/uL (ref 0.0–0.4)
Eos: 2 %
Hematocrit: 37.8 % (ref 37.5–51.0)
Hemoglobin: 12.4 g/dL — ABNORMAL LOW (ref 13.0–17.7)
Immature Grans (Abs): 0 10*3/uL (ref 0.0–0.1)
Immature Granulocytes: 0 %
Lymphocytes Absolute: 0.3 10*3/uL — ABNORMAL LOW (ref 0.7–3.1)
Lymphs: 6 %
MCH: 28.4 pg (ref 26.6–33.0)
MCHC: 32.8 g/dL (ref 31.5–35.7)
MCV: 87 fL (ref 79–97)
Monocytes Absolute: 0.6 10*3/uL (ref 0.1–0.9)
Monocytes: 11 %
Neutrophils Absolute: 4.2 10*3/uL (ref 1.4–7.0)
Neutrophils: 80 %
Platelets: 124 10*3/uL — ABNORMAL LOW (ref 150–450)
RBC: 4.36 x10E6/uL (ref 4.14–5.80)
RDW: 13 % (ref 11.6–15.4)
WBC: 5.2 10*3/uL (ref 3.4–10.8)

## 2018-11-24 LAB — QUANTIFERON-TB GOLD PLUS
QuantiFERON Mitogen Value: 0.46 IU/mL
QuantiFERON Nil Value: 0.03 IU/mL
QuantiFERON TB1 Ag Value: 0.03 IU/mL
QuantiFERON TB2 Ag Value: 0.03 IU/mL
QuantiFERON-TB Gold Plus: UNDETERMINED — AB

## 2018-11-24 NOTE — Telephone Encounter (Signed)
Called wife on DPR and advised her the patient's labs are still not within normal range to begin new medication. Dr [name] will repeat CBC in 2 weeks, the week of March 23rd. She asked how long it will take to improve; I advised was unable to answer specifically, but he needs to try to stay healthy, eat nutritiously. She stated "he is".  She stated she would let him know verbalized understanding, appreciation.

## 2018-11-24 NOTE — Progress Notes (Signed)
Repeat CBC in 2 weeks. -VRP

## 2018-11-25 NOTE — Telephone Encounter (Signed)
Returned patient's call. He reported he is a little weaker, eyesight blurrier. I advised him that Dr [name] feels the best action is a repeat CBC in 2 weeks. He cannot take new medication with persistent low lymphocyte count. He asked if coming in was a good idea with corona virus.  I advised him of precautions to best avoid corona virus including not going into public places unless wearing a mask and advised he call our office before coming in for lab. He stated he goes to [facility] to swim to keep him feeling his best. I advised that is probably not a good idea and asked if he could do some home exercises. He stated he would think about it. He thanked me for call back, verbalized understanding.

## 2018-11-25 NOTE — Telephone Encounter (Signed)
Called patient who had sent my chart asking for a call. Wife stated he couldn't come to phone at this time. I advised her we close at noon, but to have him call back before then if he is able. She verbalized understanding, appreciation.

## 2018-11-25 NOTE — Telephone Encounter (Signed)
Pt has returned call to [place]. He is asking for a call back

## 2018-11-29 ENCOUNTER — Ambulatory Visit (INDEPENDENT_AMBULATORY_CARE_PROVIDER_SITE_OTHER): Payer: Medicare Other | Admitting: Psychology

## 2018-11-29 DIAGNOSIS — F4323 Adjustment disorder with mixed anxiety and depressed mood: Secondary | ICD-10-CM

## 2018-12-01 ENCOUNTER — Telehealth: Payer: Self-pay | Admitting: *Deleted

## 2018-12-01 NOTE — Telephone Encounter (Signed)
Received fax from [place] re: patient reported adverse event- low lymphocytes while on Ampyra. Form placed on Dr [employer] desk for completion, signature.

## 2018-12-05 NOTE — Telephone Encounter (Signed)
Form completed, faxed back to [facility].

## 2018-12-06 ENCOUNTER — Ambulatory Visit (INDEPENDENT_AMBULATORY_CARE_PROVIDER_SITE_OTHER): Payer: Medicare Other | Admitting: Psychology

## 2018-12-06 DIAGNOSIS — F4323 Adjustment disorder with mixed anxiety and depressed mood: Secondary | ICD-10-CM | POA: Diagnosis not present

## 2018-12-06 NOTE — Addendum Note (Signed)
Addended by: Inis Sizer D on: 12/06/2018 04:25 PM   Modules accepted: Orders

## 2018-12-07 ENCOUNTER — Telehealth: Payer: Self-pay | Admitting: *Deleted

## 2018-12-07 DIAGNOSIS — Z79899 Other long term (current) drug therapy: Secondary | ICD-10-CM

## 2018-12-07 DIAGNOSIS — G35 Multiple sclerosis: Secondary | ICD-10-CM

## 2018-12-07 LAB — CBC WITH DIFFERENTIAL/PLATELET
Basophils Absolute: 0 10*3/uL (ref 0.0–0.2)
Basos: 1 %
EOS (ABSOLUTE): 0.1 10*3/uL (ref 0.0–0.4)
Eos: 2 %
Hematocrit: 38.3 % (ref 37.5–51.0)
Hemoglobin: 13.1 g/dL (ref 13.0–17.7)
Immature Grans (Abs): 0 10*3/uL (ref 0.0–0.1)
Immature Granulocytes: 0 %
Lymphocytes Absolute: 0.5 10*3/uL — ABNORMAL LOW (ref 0.7–3.1)
Lymphs: 10 %
MCH: 28.6 pg (ref 26.6–33.0)
MCHC: 34.2 g/dL (ref 31.5–35.7)
MCV: 84 fL (ref 79–97)
Monocytes Absolute: 0.5 10*3/uL (ref 0.1–0.9)
Monocytes: 10 %
Neutrophils Absolute: 3.8 10*3/uL (ref 1.4–7.0)
Neutrophils: 77 %
Platelets: 134 10*3/uL — ABNORMAL LOW (ref 150–450)
RBC: 4.58 x10E6/uL (ref 4.14–5.80)
RDW: 12.9 % (ref 11.6–15.4)
WBC: 4.9 10*3/uL (ref 3.4–10.8)

## 2018-12-07 NOTE — Telephone Encounter (Signed)
Spoke with patient and informed him his lymphocytes are slowly improving, but still low. Dr [name] will repeat the CBC in 1 month. He asked if they can be checked in 2 weeks. I advised that if Dr [name] had felt there'd be significant improvement in two weeks he most likely would have had the lab repeated in two weeks. Patient verbalized understanding, appreciation.

## 2018-12-13 ENCOUNTER — Ambulatory Visit (INDEPENDENT_AMBULATORY_CARE_PROVIDER_SITE_OTHER): Payer: Medicare Other | Admitting: Psychology

## 2018-12-13 DIAGNOSIS — F4323 Adjustment disorder with mixed anxiety and depressed mood: Secondary | ICD-10-CM

## 2018-12-15 NOTE — Telephone Encounter (Signed)
[name] [phone] ext [phone] is wanting and update on status of treatment plan for the patient. She also said it can be faxed to her [phone]

## 2018-12-15 NOTE — Telephone Encounter (Signed)
Returned call to [place] with [facility], advised her that the patient's labs are still not acceptable for his to begin Aubagio. I advised he is to repeat them on 01/06/2019.  She stated she will hold his new start information another month, will call back to check status at that time. She verbalized understanding, appreciation of call back.

## 2018-12-19 ENCOUNTER — Ambulatory Visit (INDEPENDENT_AMBULATORY_CARE_PROVIDER_SITE_OTHER): Payer: Medicare Other | Admitting: Psychology

## 2018-12-19 DIAGNOSIS — F4323 Adjustment disorder with mixed anxiety and depressed mood: Secondary | ICD-10-CM

## 2018-12-26 ENCOUNTER — Encounter: Payer: Self-pay | Admitting: Thoracic Surgery (Cardiothoracic Vascular Surgery)

## 2018-12-26 ENCOUNTER — Other Ambulatory Visit: Payer: Self-pay

## 2019-01-02 ENCOUNTER — Telehealth: Payer: Self-pay | Admitting: *Deleted

## 2019-01-02 ENCOUNTER — Other Ambulatory Visit (INDEPENDENT_AMBULATORY_CARE_PROVIDER_SITE_OTHER): Payer: Self-pay

## 2019-01-02 ENCOUNTER — Other Ambulatory Visit: Payer: Self-pay

## 2019-01-02 ENCOUNTER — Ambulatory Visit (INDEPENDENT_AMBULATORY_CARE_PROVIDER_SITE_OTHER): Payer: Medicare Other | Admitting: Psychology

## 2019-01-02 DIAGNOSIS — F4323 Adjustment disorder with mixed anxiety and depressed mood: Secondary | ICD-10-CM

## 2019-01-02 DIAGNOSIS — Z79899 Other long term (current) drug therapy: Secondary | ICD-10-CM

## 2019-01-02 DIAGNOSIS — G35 Multiple sclerosis: Secondary | ICD-10-CM

## 2019-01-02 DIAGNOSIS — Z0289 Encounter for other administrative examinations: Secondary | ICD-10-CM

## 2019-01-02 NOTE — Addendum Note (Signed)
Addended by: Minna Antis on: 01/02/2019 01:03 PM   Modules accepted: Orders

## 2019-01-02 NOTE — Telephone Encounter (Signed)
Called patient and reminded him of one month repeat CBC. I asked if he would like to come into office or [place] office.  He stated he preferred to come into [place] office. I advised him the order will be placed and advised he wear a mask into our office. He verbalized understanding, appreciation, stated he would probably come today.

## 2019-01-02 NOTE — Telephone Encounter (Signed)
Received fax from [place] re: adverse event on Tecfidera., low lymphocytes in blood. Form signed, faxed back with last four lab results and note: tecfidera stopped Dec 2019.

## 2019-01-03 ENCOUNTER — Telehealth: Payer: Self-pay | Admitting: *Deleted

## 2019-01-03 DIAGNOSIS — D696 Thrombocytopenia, unspecified: Secondary | ICD-10-CM

## 2019-01-03 DIAGNOSIS — R7989 Other specified abnormal findings of blood chemistry: Secondary | ICD-10-CM

## 2019-01-03 LAB — CBC WITH DIFFERENTIAL/PLATELET
Basophils Absolute: 0 10*3/uL (ref 0.0–0.2)
Basos: 1 %
EOS (ABSOLUTE): 0.1 10*3/uL (ref 0.0–0.4)
Eos: 1 %
Hematocrit: 39.2 % (ref 37.5–51.0)
Hemoglobin: 12.9 g/dL — ABNORMAL LOW (ref 13.0–17.7)
Immature Grans (Abs): 0 10*3/uL (ref 0.0–0.1)
Immature Granulocytes: 0 %
Lymphocytes Absolute: 0.4 10*3/uL — ABNORMAL LOW (ref 0.7–3.1)
Lymphs: 7 %
MCH: 27.9 pg (ref 26.6–33.0)
MCHC: 32.9 g/dL (ref 31.5–35.7)
MCV: 85 fL (ref 79–97)
Monocytes Absolute: 0.6 10*3/uL (ref 0.1–0.9)
Monocytes: 11 %
Neutrophils Absolute: 4.4 10*3/uL (ref 1.4–7.0)
Neutrophils: 80 %
Platelets: 144 10*3/uL — ABNORMAL LOW (ref 150–450)
RBC: 4.63 x10E6/uL (ref 4.14–5.80)
RDW: 13.1 % (ref 11.6–15.4)
WBC: 5.5 10*3/uL (ref 3.4–10.8)

## 2019-01-03 NOTE — Telephone Encounter (Signed)
Will place consult to [name]. -VRP

## 2019-01-03 NOTE — Telephone Encounter (Signed)
Spoke with patient and informed him that his labs showed lymphocytes are still low in spite of stopping tecfidera. Platelets are also low. Dr [name] recommends a heme/onc consult. The patient stated he saw Dr Curt Bears years ago at [facility].  EMR shows office visit in 2012, 2013 for thrombocytopenia. Patient asked to be refered back to Dr [name]. He asked if he should get repeat CBC in another month. I advised he should. He  verbalized understanding, appreciation of call. Referral placed.

## 2019-01-04 ENCOUNTER — Telehealth: Payer: Self-pay | Admitting: Hematology

## 2019-01-04 NOTE — Telephone Encounter (Signed)
A new hem appt has been scheduled for the pt to see Dr. Irene Limbo on 4/27 at 11am. Pt wanted to be seen sooner than later. Aware to arrive 15 minutes early.

## 2019-01-06 NOTE — Progress Notes (Signed)
HEMATOLOGY/ONCOLOGY CONSULTATION NOTE  Date of Service: 01/09/2019  Patient Care Team: Levin Erp, MD as PCP - General (Internal Medicine) Rexene Alberts, MD (Cardiothoracic Surgery) Curt Bears, MD (Hematology and Oncology) [name], MD as Attending Physician (Cardiology) Tanda Rockers, MD as Attending Physician (Pulmonary Disease) Penni Bombard, MD as Consulting Physician (Neurology)  CHIEF COMPLAINTS/PURPOSE OF CONSULTATION:  Lymphopenia  HISTORY OF PRESENTING ILLNESS:   [name] is a wonderful 73 y.o. male who has been referred to [place] by Dr. Andrey Spearman for evaluation and management of Lymphopenia. The pt reports that he is doing well overall.   The pt notes that he has a long history of Multiple sclerosis. The pt notes that he took Tecfidera for 3-6 years. He stopped Tecfidera 10/05/18 with Dr. Andrey Spearman in Neurology, and is intending to switch to Aubagio. He also takes Ampyra. The pt notes that he tolerated Tecfidera well and without difficulty when he took the medication on a full stomach.  In 2012/2013 the patient's PLT were lower in 60k range, and saw my colleague [name]. At that time, the pt stopped his statin. He denies a concern for frequent infections nor recent infections. He began MS treatment with Betaseron many years ago in the 1990's. He notes that he has been on several kinds of immunomodulators. The pt also has a history of a dissected aorta and CHF. He has had shingles before and has not had the Shingrix vaccine.  The pt notes that he does not get MS flares, and has always been primary progressive. He has not had relapsing remitting course.  Most recent lab results (01/02/19) of CBC is as follows: all values are WNL except for HGB at 12.9, PLT at 144k, Lymphocytes abs at 400.  On review of systems, pt reports stable energy levels, stable weight, eating well, and denies mouth sores, frequent infections, recent  infections, fevers, chills, abdominal pains, and any other symptoms.   On PMHx the pt reports dissected aorta, CHF, Multiple Sclerosis. 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   MEDICAL HISTORY:  Past Medical History:  Diagnosis Date  . Anemia   . Aneurysm of aortic arch ([place]) 04/04/2012   Chronic aneurysmal dilatation of aortic arch with chronic type A aortic dissection, s/p replacement of ascending thoracic aorta  . Aortic dissection ([place]) 11/05/1998   S/P emergency repair of acute type A aortic dissection with resuspension of native aortic valve  . Aortic dissection, thoracic ([place])   . Aortic root aneurysm ([place])   . Atrial fibrillation ([place]) 01/30/2013   Atrial fibrillation   . Bilateral lower extremity edema   . BPH (benign prostatic hyperplasia)   . CHF (congestive heart failure) ([place]) 1946/04/26   2D Echo - EF 50-55%, mild-moderately dilated right ventricle, mild-moderate tricuspid valve regurgitation, moderately dilated right atrium  . Depression   . Dyslipidemia   . Exertional shortness of breath    "for awhile now" (09/11/2013)  . Hemorrhoids   . History of blood transfusion   . Hypertension   . Mitral regurgitation 02/20/2013  . Multiple sclerosis ([place])   . Neuromuscular disorder ([place])   . Pleural effusion, right, large 01/30/2013  . Severe tricuspid valve regurgitation 01/30/2013   Severe tricuspid regurg by recent 2-D echo with a dilated tricuspid annulus, moderate pulmonary hypertension and biatrial enlargement   . Thrombocytopenia ([place])     SURGICAL HISTORY: Past Surgical History:  Procedure Laterality Date  . HEMORRHOID SURGERY  2009   Internal, external hemorrhoidectomy, general anesthesia,prone position. [Other]  . Open reduction and internal fixation of right distal radius fracture using [employer] distal radius volar locking plate.  07/2005   Dr Ninfa Linden  . Repair of acute type A aortic dissection with  resuspension of native aortic valve  10/15/1998   Dr Roxy Manns  . TEE WITHOUT CARDIOVERSION N/A 02/17/2013   Procedure: TRANSESOPHAGEAL ECHOCARDIOGRAM (TEE);  Surgeon: [name], MD;  Location: [facility] ENDOSCOPY;  Service: Cardiovascular;  Laterality: N/A;    SOCIAL HISTORY: Social History   Socioeconomic History  . Marital status: Married    Spouse name: Jackelyn Poling  . Number of children: 2  . Years of education: Ph.D  . Highest education level: Not on file  Occupational History  . 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 Financial resource strain: Not hard at all  . Food insecurity:    Worry: Never true    Inability: Never true  . Transportation needs:    Medical: No    Non-medical: No  Tobacco Use  . Smoking status: Never Smoker  . Smokeless tobacco: Never Used  Substance and Sexual Activity  . Alcohol use: Yes    Comment: 09/11/2013 "might have a drink once/month"  . Drug use: No  . Sexual activity: Not Currently  Lifestyle  . Physical activity:    Days per week: Not on file    Minutes per session: Not on file  . Stress: Not on file  Relationships  . Social connections:    Talks on phone: Not on file    Gets together: Not on file    Attends religious service: Not on file    Active member of club or organization: Not on file    Attends meetings of clubs or organizations: Not on file    Relationship status: Not on file  . Intimate partner violence:    Fear of current or ex partner: Not on file    Emotionally abused: Not on file    Physically abused: Not on file    Forced sexual activity: Not on file  Other Topics Concern  . Not on file  Social History Narrative   Patient lives at home with spouse.   Caffeine Use: eat a lot of chocolate, sodas, coffee and tea occasionally    FAMILY HISTORY: Family History  Problem Relation Age of Onset  . Thyroid cancer Father        smoked  . Heart attack Mother   .  Lung cancer Brother        smoked  . Emphysema Brother        smoked    ALLERGIES:  has No Known Allergies.  MEDICATIONS:  Current Outpatient Medications  Medication Sig Dispense Refill  . AMPYRA 10 MG TB12 TAKE ONE TABLET BY MOUTH TWICE DAILY (APPROXIMATELY 12 HOURS APART). MAY BE TAKEN WITH OR WITHOUT FOOD. DO NOT CUT OR CRUSH. STORE AT ROOM T 180 tablet 3  . Cholecalciferol (VITAMIN D3) 2000 UNITS TABS Take 1 capsule by mouth daily.    . Dimethyl Fumarate (TECFIDERA) 240 MG CPDR Take 1 capsule by mouth 2 (two) times daily.    Marland Kitchen docusate sodium (COLACE) 100 MG capsule Take 100 mg by mouth daily as needed for mild constipation.    Marland Kitchen ELIQUIS 5 MG TABS tablet TAKE 1 TABLET BY MOUTH TWICE  DAILY. 180 tablet 1  . ferrous sulfate 325 (65 FE) MG tablet Take 1 tablet (325 mg total) by mouth 3 (three) times daily with meals. 90 tablet 1  . folic acid (FOLVITE) 1 MG tablet Take 1 mg by mouth daily.      . furosemide (LASIX) 40 MG tablet TAKE 1 TABLET BY MOUTH TWICE DAILY. MAY TAKE AN ADDITIONAL TABLETS INTHE P.M. AS NEEDED. 180 tablet 3  . losartan (COZAAR) 100 MG tablet TAKE 1 TABLET ONCE DAILY. 30 tablet 11  . Multiple Vitamin (MULTIVITAMIN WITH MINERALS) TABS tablet Take 1 tablet by mouth daily. Centrum for men.    Marland Kitchen QUEtiapine (SEROQUEL) 25 MG tablet Take 25 mg by mouth at bedtime as needed.     Marland Kitchen spironolactone (ALDACTONE) 25 MG tablet TAKE 1 TABLET EACH DAY. 30 tablet 2  . triamcinolone cream (KENALOG) 0.1 % Apply 1 application topically daily as needed (rash).      No current facility-administered medications for this visit.     REVIEW OF SYSTEMS:    10 Point review of Systems was done is negative except as noted above.  PHYSICAL EXAMINATION:  . Vitals:   01/09/19 1136  BP: 122/76  Pulse: 80  Resp: 18  Temp: (!) 97.4 F (36.3 C)  SpO2: 100%   Filed Weights   01/09/19 1136  Weight: 164 lb 11.2 oz (74.7 kg)   .Body mass index is 25.04 kg/m.  GENERAL:alert, in no acute  distress and comfortable SKIN: no acute rashes, no significant lesions EYES: conjunctiva are pink and non-injected, sclera anicteric OROPHARYNX: MMM, no exudates, no oropharyngeal erythema or ulceration NECK: supple, no JVD LYMPH:  no palpable lymphadenopathy in the cervical, axillary or inguinal regions LUNGS: clear to auscultation b/l with normal respiratory effort HEART: regular rate & rhythm ABDOMEN:  normoactive bowel sounds , non tender, not distended. Extremity: no pedal edema PSYCH: alert & oriented x 3 with fluent speech NEURO: no focal motor/sensory deficits  LABORATORY DATA:  I have reviewed the data as listed  . CBC Latest Ref Rng & Units 01/02/2019 12/06/2018 11/21/2018  WBC 3.4 - 10.8 x10E3/uL 5.5 4.9 5.2  Hemoglobin 13.0 - 17.7 g/dL 12.9(L) 13.1 12.4(L)  Hematocrit 37.5 - 51.0 % 39.2 38.3 37.8  Platelets 150 - 450 x10E3/uL 144(L) 134(L) 124(L)   . CBC    Component Value Date/Time   WBC 5.5 01/02/2019 1438   WBC 5.7 06/18/2017 1627   RBC 4.63 01/02/2019 1438   RBC 3.92 (L) 06/18/2017 1627   HGB 12.9 (L) 01/02/2019 1438   HGB 12.1 (L) 02/10/2012 1110   HCT 39.2 01/02/2019 1438   HCT 37.5 (L) 02/10/2012 1110   PLT 144 (L) 01/02/2019 1438   MCV 85 01/02/2019 1438   MCV 84.0 02/10/2012 1110   MCH 27.9 01/02/2019 1438   MCH 28.6 06/18/2017 1627   MCHC 32.9 01/02/2019 1438   MCHC 32.6 06/18/2017 1627   RDW 13.1 01/02/2019 1438   RDW 14.7 (H) 02/10/2012 1110   LYMPHSABS 0.4 (L) 01/02/2019 1438   LYMPHSABS 0.9 02/10/2012 1110   MONOABS 0.1 06/18/2017 1627   MONOABS 0.4 02/10/2012 1110   EOSABS 0.1 01/02/2019 1438   BASOSABS 0.0 01/02/2019 1438   BASOSABS 0.0 02/10/2012 1110    . CMP Latest Ref Rng & Units 11/07/2018 08/22/2018 07/06/2018  Glucose 65 - 99 mg/dL - 103(H) 96  BUN 8 - 27 mg/dL - 47(H) 36(H)  Creatinine 0.76 - 1.27 mg/dL - 1.49(H) 1.52(H)  Sodium 134 -  144 mmol/L - 141 141  Potassium 3.5 - 5.2 mmol/L - 4.7 4.1  Chloride 96 - 106 mmol/L - 101 106   CO2 20 - 29 mmol/L - 24 29  Calcium 8.6 - 10.2 mg/dL - 9.9 9.9  Total Protein 6.0 - 8.5 g/dL 7.6 7.1 -  Total Bilirubin 0.0 - 1.2 mg/dL 0.6 0.4 -  Alkaline Phos 39 - 117 IU/L 185(H) 178(H) -  AST 0 - 40 IU/L 30 25 -  ALT 0 - 44 IU/L 33 29 -     RADIOGRAPHIC STUDIES: I have personally reviewed the radiological images as listed and agreed with the findings in the report. No results found.  ASSESSMENT & PLAN:   73 y.o. male with  1. Chronic Lymphopenia Likely related to his immunomodulatory drug therapy. Patient was last on tecfidera. PLAN -Discussed patient's most recent labs from 01/02/19, PLT borderline low at 144k. Lymphocytes abs at 400. WBC normal at 5.5k and HGB normal at 12.9. -Discussed that patient's lymphocytes have been low for several years, since at least 2009. Earliest available lab from 01/09/08 revealed Lymphocytes at 300. -it appears that his lymphocyte counts might have been low even prior to use of tecfidera from other previous treatments. -Discussed that any immuno-modulating agent which controls MS,  And certainly the likeliest explanation for his suppressed lymphocyte counts. -with tecfidera lymphocyte counts can show prolonged and long term suppress even off the medication. -Discussed that delayed lymphocyte recovery can be observed after use of Tecfidera and other immuno-modulators, and his lymphocytes may not fully recover after many years of suppression -Discussed that the the patient's lymphopenia has importantly not translated into concerns for frequent infections, nor recent infections, though there is certainly an increased risk of severe infections including CMV, fungal infections etc. -Discussed that the potential risk of infections must be weighed against the potential risk of progressing MS, when deciding which MS medication to employ and is a decision to be made by his neurologist .-I do not feel that invasive bone marrow testing is necessary, as I do not  suspect a primary bone marrow problem given the normalcy of other blood counts and the chronic non progressive nature of his lymphopenia and obvious etiology for this I.e his immunomodulatory medications. -Recommend appropriate anti-microbials prophylaxis as indicated and appropriate vaccinations including flu, pneumonia (Prevnar and pneumovax), and shingerix with Neurology -Offered to obtain labs today, but pt prefers to hold off on this and continue follow up with PCP and Neurology -Will see the pt back as needed if other progressive cytopenias develop that are not explained by his immunosuppressants.   All of the patients questions were answered with apparent satisfaction. The patient knows to call the clinic with any problems, questions or concerns.  The total time spent in the appt was 45 minutes and more than 50% was on counseling and direct patient cares.    Sullivan Lone MD MS AAHIVMS [facility] Hematology/Oncology Physician [facility]  (Office):       [phone] (Work cell):  [phone] (Fax):           [phone]  01/09/2019 12:31 PM  I, Baldwin [place], am acting as a scribe for Dr. Sullivan Lone.   .I have reviewed the above documentation for accuracy and completeness, and I agree with the above. Brunetta Genera MD

## 2019-01-09 ENCOUNTER — Telehealth: Payer: Self-pay | Admitting: Hematology

## 2019-01-09 ENCOUNTER — Inpatient Hospital Stay: Payer: Medicare Other

## 2019-01-09 ENCOUNTER — Other Ambulatory Visit: Payer: Self-pay

## 2019-01-09 ENCOUNTER — Inpatient Hospital Stay: Payer: Medicare Other | Attending: Hematology | Admitting: Hematology

## 2019-01-09 ENCOUNTER — Telehealth: Payer: Self-pay | Admitting: *Deleted

## 2019-01-09 ENCOUNTER — Telehealth: Payer: Self-pay | Admitting: Oncology

## 2019-01-09 VITALS — BP 122/76 | HR 80 | Temp 97.4°F | Resp 18 | Ht 68.0 in | Wt 164.7 lb

## 2019-01-09 DIAGNOSIS — G35 Multiple sclerosis: Secondary | ICD-10-CM | POA: Diagnosis not present

## 2019-01-09 DIAGNOSIS — D696 Thrombocytopenia, unspecified: Secondary | ICD-10-CM

## 2019-01-09 DIAGNOSIS — I110 Hypertensive heart disease with heart failure: Secondary | ICD-10-CM

## 2019-01-09 DIAGNOSIS — I509 Heart failure, unspecified: Secondary | ICD-10-CM | POA: Diagnosis not present

## 2019-01-09 DIAGNOSIS — D72810 Lymphocytopenia: Secondary | ICD-10-CM

## 2019-01-09 NOTE — Telephone Encounter (Signed)
No los per 4/27. °

## 2019-01-09 NOTE — Telephone Encounter (Signed)
Opened in [place]

## 2019-01-09 NOTE — Telephone Encounter (Signed)
I called patient. Will plan to start copaxone due to persistent low lymphocytes. -VRP

## 2019-01-09 NOTE — Telephone Encounter (Signed)
Re: my chart messages regarding restarting Tecfidera. I called the patient and advised him that Dr [name] reviewed the notes. Dr [name] will not restart [name] due to persistent low lymphocytes. Lymphs need to be > 0.5 before starting new therapy with Aubagio. The patient stated his lymphs have been that low since long before starting Tecfidera and will always be low. He stated he is unhappy about being off MS therapy x 3 months, has "suffered" and will take the risk. He is asking to speak directly to Dr [name]. I advised him will let Dr Randel Pigg know he is requesting a call. He remained adamant that he get a call.

## 2019-01-10 NOTE — Telephone Encounter (Signed)
LVM requesting a call back to discuss Copaxone start form.

## 2019-01-10 NOTE — Telephone Encounter (Signed)
Attempted to call patient x 2 to discuss Copaxone start form. Phone has immediately rang busy both times.

## 2019-01-10 NOTE — Telephone Encounter (Signed)
Received call back from patient. I advised him I was calling to go over Copaxone start form, answer questions and get a verbal signature from him if he agrees to start. He agreed to go over consent, and we did. He had no questions. I  put call on speaker. Liane Comber RN and I listened as he gave verbal consent to sign form on his behalf, due to Covid 19. I gave him [employer] #, advised he should get a call after they have received form and his insurance information.  He  verbalized understanding, appreciation. Form signed by both RNs, placed on Dr [employer] desk for completion and signature.

## 2019-01-12 ENCOUNTER — Telehealth: Payer: Self-pay | Admitting: *Deleted

## 2019-01-12 NOTE — Telephone Encounter (Signed)
Received PA form for Ampyra for [facility]. Form filled out, signed and faxed back to [place].

## 2019-01-16 ENCOUNTER — Ambulatory Visit (INDEPENDENT_AMBULATORY_CARE_PROVIDER_SITE_OTHER): Payer: Medicare Other | Admitting: Psychology

## 2019-01-16 DIAGNOSIS — F4323 Adjustment disorder with mixed anxiety and depressed mood: Secondary | ICD-10-CM

## 2019-01-16 NOTE — Telephone Encounter (Signed)
Copaxone start form, insurance information, demographics, med/allergies list faxed to [employer] [phone].

## 2019-01-16 NOTE — Telephone Encounter (Addendum)
Received fax from [facility]: Ampyra (dalfampridine ER)  approved 01/12/2019 - 01/12/2020. [place] # [phone]

## 2019-01-16 NOTE — Telephone Encounter (Signed)
Received fax from [employer] re:  A prescription and service request form has been received.

## 2019-01-18 NOTE — Telephone Encounter (Signed)
[facility] re: they've received enrollment for patient for Copaxone therapy. Office will be notified in 48 hrs with status of benefit verification.

## 2019-01-23 NOTE — Telephone Encounter (Signed)
Received fax from [place], re: Copaxone 40 mg approved 01/20/2019 - 01/20/2020.

## 2019-01-26 ENCOUNTER — Telehealth: Payer: Self-pay | Admitting: Diagnostic Neuroimaging

## 2019-01-26 NOTE — Telephone Encounter (Signed)
Patient called stated he needs a new reauthorization for his medicine Ampyra.

## 2019-01-26 NOTE — Telephone Encounter (Addendum)
Called patient and advised him I got authorization on 01/12/19 and faxed it to pharmacy. He stated he got letter stating the med needed PA. I advised him I will fax again and call them later to confirm. Patient verbalized understanding, appreciation.  Timothy [place] PA [facility], [phone]. Received call back from patient who stated he called pharmacy and again was told Ampyra needed PA.  I advised him I have just faxed PA [facility] and will call them in a few minutes ot be sure they got Fax. He  verbalized understanding, appreciation. [facility], spoke with Aliya and advised her med was approved, letter faxed to them x 2. She stated she can't process Rx. She called their PA dept, came back on line and stated Rx was for Ampyra not generic form. Spoke with pharmacist, Sonia Baller who stated they need new Rx specifically for generic because previous Rx was in Feb and for brand. Per Dr [name], gave her Rx exactly as previus Rx but for generic. She stated his next refill is due in 19 days,  and he'll be notified. She verbalized understanding, appreciation. Called patient and informed him of above conversation.  He  verbalized understanding, appreciation.

## 2019-01-30 ENCOUNTER — Encounter: Payer: Self-pay | Admitting: *Deleted

## 2019-01-30 ENCOUNTER — Ambulatory Visit (INDEPENDENT_AMBULATORY_CARE_PROVIDER_SITE_OTHER): Payer: Medicare Other | Admitting: Psychology

## 2019-01-30 DIAGNOSIS — F4323 Adjustment disorder with mixed anxiety and depressed mood: Secondary | ICD-10-CM | POA: Diagnosis not present

## 2019-01-30 NOTE — Telephone Encounter (Addendum)
Received my chart from patient:  Please send material to get Ampyra prescription approved quickly, Not the generic. Replied and advised him there wasn't note indicating he needed brand only, apologized and advised will call today to make change. [facility] [facility], Mamie Nick [phone], spoke with [place], and she'll fax forms for PA for brand Ampyra.  Received forms for Ampyra PA. Called patient to discuss, LVM.

## 2019-01-30 NOTE — Telephone Encounter (Addendum)
Called patient and asked if he has ever taken generic, dalfampridine. He stated he has not. Dr [name] started him on brand Amypra in 2012.  He stated he has been paying $60 a month with patient assistance. Dalfampridine doesn't have PA with the pharmacy he uses, and he stated Amprya is now going to cost much more unless he can get PA.  He stated he will research for better costs. I sent him list of [place] drug assistance resources via my chart. I advised him I can add note on Amypyra PA form stating he has been on brand only x 8 years with improvement and tolerating med well. He verbalized understanding, appreciation. Ampyra PA form completed, signed and faxed to [place], [phone].

## 2019-01-30 NOTE — Telephone Encounter (Signed)
Pt called in returning call

## 2019-01-31 ENCOUNTER — Encounter: Payer: Self-pay | Admitting: *Deleted

## 2019-01-31 NOTE — Telephone Encounter (Signed)
Received [facility]: Copaxone PFS 40 mg/mL (12 x 45mL)  shipped on 01/26/2019.

## 2019-01-31 NOTE — Telephone Encounter (Signed)
Received 2 fax from [facility]: request for completion of question #2 on PA form sent in yesterday and letter of denial of [place]. Question 2 on form answered, letter of expedited appeal written and signed, and these were faxed back to [facility].

## 2019-02-02 ENCOUNTER — Telehealth: Payer: Self-pay | Admitting: Diagnostic Neuroimaging

## 2019-02-02 NOTE — Telephone Encounter (Signed)
Called patient and advised him of call from [facility] w/[employer]. He stated they called him yesterday and "basically forced him to go on dalfampridine". He received a bottle of it today. He stated he will try it fo r a month or so and see how he does. He verbalized appreciation for efforts towards PA of Ampyra.

## 2019-02-02 NOTE — Telephone Encounter (Signed)
See tele note dated today: appeal for Ampyra denied. Will inform patient.

## 2019-02-02 NOTE — Telephone Encounter (Signed)
Kayla from [employer] [place] called to inform us that the [facility] 10 MG TB12 appeal has been denied and the decision of the denial has been faxed to [phone]

## 2019-02-07 ENCOUNTER — Telehealth: Payer: Self-pay | Admitting: *Deleted

## 2019-02-07 NOTE — Telephone Encounter (Signed)
Received [facility] from [facility] re: ampyra shipped 02/01/19 and Copaxone shipped 01/26/19.

## 2019-02-13 ENCOUNTER — Encounter: Payer: Medicare Other | Admitting: Thoracic Surgery (Cardiothoracic Vascular Surgery)

## 2019-02-13 ENCOUNTER — Other Ambulatory Visit: Payer: Self-pay

## 2019-02-13 ENCOUNTER — Ambulatory Visit: Payer: BC Managed Care – PPO | Admitting: Psychology

## 2019-02-13 ENCOUNTER — Ambulatory Visit (INDEPENDENT_AMBULATORY_CARE_PROVIDER_SITE_OTHER): Payer: Medicare Other | Admitting: Psychology

## 2019-02-13 DIAGNOSIS — F4324 Adjustment disorder with disturbance of conduct: Secondary | ICD-10-CM | POA: Diagnosis not present

## 2019-02-20 ENCOUNTER — Ambulatory Visit (INDEPENDENT_AMBULATORY_CARE_PROVIDER_SITE_OTHER): Payer: Medicare Other | Admitting: Psychology

## 2019-02-20 DIAGNOSIS — F4323 Adjustment disorder with mixed anxiety and depressed mood: Secondary | ICD-10-CM | POA: Diagnosis not present

## 2019-03-06 ENCOUNTER — Other Ambulatory Visit (HOSPITAL_COMMUNITY): Payer: Self-pay | Admitting: Internal Medicine

## 2019-03-06 ENCOUNTER — Ambulatory Visit (INDEPENDENT_AMBULATORY_CARE_PROVIDER_SITE_OTHER): Payer: Medicare Other | Admitting: Psychology

## 2019-03-06 ENCOUNTER — Telehealth: Payer: Self-pay | Admitting: *Deleted

## 2019-03-06 DIAGNOSIS — F4323 Adjustment disorder with mixed anxiety and depressed mood: Secondary | ICD-10-CM | POA: Diagnosis not present

## 2019-03-06 NOTE — Telephone Encounter (Signed)
[facility]: [place] shipped 03/01/2019.

## 2019-03-20 ENCOUNTER — Ambulatory Visit (INDEPENDENT_AMBULATORY_CARE_PROVIDER_SITE_OTHER): Payer: Medicare Other | Admitting: Psychology

## 2019-03-20 DIAGNOSIS — F4323 Adjustment disorder with mixed anxiety and depressed mood: Secondary | ICD-10-CM | POA: Diagnosis not present

## 2019-04-03 ENCOUNTER — Ambulatory Visit (INDEPENDENT_AMBULATORY_CARE_PROVIDER_SITE_OTHER): Payer: Medicare Other | Admitting: Psychology

## 2019-04-03 DIAGNOSIS — F4323 Adjustment disorder with mixed anxiety and depressed mood: Secondary | ICD-10-CM | POA: Diagnosis not present

## 2019-04-09 ENCOUNTER — Other Ambulatory Visit (HOSPITAL_COMMUNITY): Payer: Self-pay | Admitting: Internal Medicine

## 2019-04-10 ENCOUNTER — Ambulatory Visit: Payer: Medicare Other | Admitting: Diagnostic Neuroimaging

## 2019-04-10 ENCOUNTER — Telehealth: Payer: Self-pay | Admitting: *Deleted

## 2019-04-10 NOTE — Telephone Encounter (Signed)
Patient was no show for follow up today. 

## 2019-04-11 ENCOUNTER — Encounter: Payer: Self-pay | Admitting: Diagnostic Neuroimaging

## 2019-04-17 ENCOUNTER — Ambulatory Visit: Payer: BC Managed Care – PPO | Admitting: Psychology

## 2019-04-18 ENCOUNTER — Encounter: Payer: Self-pay | Admitting: Diagnostic Neuroimaging

## 2019-04-18 ENCOUNTER — Other Ambulatory Visit: Payer: Self-pay

## 2019-04-18 ENCOUNTER — Ambulatory Visit (INDEPENDENT_AMBULATORY_CARE_PROVIDER_SITE_OTHER): Payer: Medicare Other | Admitting: Diagnostic Neuroimaging

## 2019-04-18 VITALS — BP 142/67 | HR 79 | Temp 97.8°F | Ht 68.0 in | Wt 159.0 lb

## 2019-04-18 DIAGNOSIS — D696 Thrombocytopenia, unspecified: Secondary | ICD-10-CM

## 2019-04-18 DIAGNOSIS — G35 Multiple sclerosis: Secondary | ICD-10-CM

## 2019-04-18 NOTE — Progress Notes (Signed)
PATIENT: [name] DOB: Oct 05, 1945   REASON FOR VISIT: follow up for MS HISTORY FROM: patient  Chief Complaint  Patient presents with   Multiple Sclerosis    rm 6, 6 month FU, "swimming 3 days a week; tolerating Copaxone and noticed some improvements"     HISTORY OF PRESENT ILLNESS:  UPDATE (04/18/19, VRP): Since last visit, doing WELL. Symptoms are stable / improved slightly. No alleviating or aggravating factors. Tolerating copaxone.  Now back to swimming exercises.  UPDATE (10/05/18, VRP): Since last visit, doing about the same. Symptoms are stable. Patient wants to consider staying on tecfidera, in spite of low lymphocyte count and risk of PML. No alleviating or aggravating factors.  UPDATE (08/22/18, VRP): Since last visit, symptoms are slightly progressed (gait issues). No alleviating or aggravating factors. Tolerating tecfidera and ampyra. Staying active with swimming.  UPDATE (08/18/17, VRP): Since last visit, tolerating meds. No alleviating or aggravating factors. Ampyra helping somewhat. Having to lean on walker more.   UPDATE 08/18/16: Since last visit, tolerating meds. No new issues. Some slight subjective progression of symptoms. Had a fall recently with subsequent right forearm bruising.   UPDATE 02/11/16: Since last visit, tolerating tecfidera. Some slow progression. Asking about new MS medications.  UPDATE 08/13/15: Tolerating meds. More urinary issues. Using walker mainly.   UPDATE 04/16/15: Since last visit, patient is stable. Getting home visits with palliative care.   UPDATE 02/05/15: Discussed palliative care consult and options.   UPDATE 08/02/14: Since last visit, feels stable. Tolerating tecfidera and ampyra. No new events. Uses cane and walker.  Swimming more. Wife notes memory issues slightly worse.   UPDATE 01/31/14: Since last visit, continues on tecfidera. No progression of neuro sxs or MS sxs. Asking about driving again. Has not driven in last 3-4  months. Ongoing medical mgmt of his cardiac issues, and he is not a surgical candidate.  UPDATE 10/25/13 (LL): Patient comes in for revisit, since last visit he had worsening right-sided HF and was hospitalized.  He was seen by [name] for surgical consultation to consider tricuspid valve repair and possible Maze procedure.  He was referred to the advanced heart failure clinic who has been adjusting his lasix to manage his lower extremity edema and shortness of breath.  He is to be seen by [name] again for follow up in another month, and says [name] felt that surgery was needed.  He comes in today to discuss with [name] how his MS would be affected by having the surgery.  His wife feels like he is having more difficulty walking since last visit, but he has not had any falls.  He is tolerating Tecfidera and Ampyra well.  UPDATE 07/19/13 (VP): Patient continues to have progressive deterioration in cognitive ability, memory, mood lability, gait stability. Patient is on tecfidera now, and tolerating without side effects. Patient feels when he came off of Betaseron he had some accelerated progression of MS that this has slowed down since starting tecfidera. Unclear whether patient's current level of functioning is on the same trajectory as the decline that was noted while patient was on Betaseron, or whether there has been some acceleration of deterioration. Other factors include some marital discord and comorbid depression. Other contributing factors include his cardiac issues including atrial fibrillation, on Coumadin, as well as severe tricuspid valve regurgitation leading to peripheral edema. Unfortunately patient is not felt to be a good surgical candidate.   UPDATE 01/13/13: [name] who was  previously [name] patient comes in today for follow up of MS. The patient is accompanied by his wife. Pt. States he is doing worse since his last visit in Feb. He is walking slower and  having more difficulty standing from a seated position. 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 [name] had talked to him about changing MS medications and he thinks he wants to. Having more memory problems. Continuing on betaseron right now.   PRIOR HPI (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 with a history of multiple sclerosis diagnosed in September 1996. He has been on Betaseron since 1996 and has right leg spasticity requiring him to use a cane in his left hand.He does not have any significant side effects from the Betaseron. Blood studies 02/10/2012 are normal CBC and CMP except for a low platelet count of 63K, Hgb 12.1 and alk Phos of 253. This is being followed by [name] and [name]. He has never had lymphopenia or neutropenia. There is no change in his symptoms. He denies bowel or bladder incontinence, weakness, Lhermitte's sign, or double vision. He swims 3 times per week for 10 laps which is a little over a quarter of a mile, claims he is winded afterwards. He has not fallen, uses a cane in his left hand. MRI of the brain and cervical spine with and without contrast enhancement 03/26/2010 showed multiple subcortical, periventricular, and brainstem white matter hyperintensities without enhancing lesions present. There were T1 black holes and atrophy present. The cervical MRI showed a large central disc protrusion at C5-6 and spinal cord hyperintensities at C4 and brain stem representing remote demyelinating plaques without enhancing lesions present. Ampyra was started November 2012 with improvement in gait. He was pleased with the addition of the drug and his response. Pt denies injection site problems however he says he is tired of shots. He has been on [employer] since 1996 and we have talked about whether changing to an oral agent should be considered. He had several falls with his  right leg giving way on standing. He has right knee pain. He states his memory is worse. He denies numbness delusions or depression. 07/11/2012= (MMSE29/30. Clock drawing task4/4. Animal fluency test 17). His wife has noted more problems walking which the patient is hesitant to discuss. He does admit since calling me for a work in appointment , that he believes he is having more difficulty walking. He uses a walker at home and is noticeably slower. He uses the walker to get out of a bed that is low and out of a chair. He notices shortness of breath on swimming. His swallowing is okay except for occasional difficulty with liquids. He has right knee pain when walking. Betaseron seems to help his knee pain, walking, and his eyes after an injection. He is hesitant to go off of the medication for 3 weeks. He has increased spasms in his right foot and leg that are also moving to the left leg. He has swelling in his legs treated with furosemide.    REVIEW OF SYSTEMS: Full 14 system review of systems performed and negative except: as per HPI.  ALLERGIES: No Known Allergies  HOME MEDICATIONS: Outpatient Medications Prior to Visit  Medication Sig Dispense Refill   AMPYRA 10 MG TB12 TAKE ONE TABLET BY MOUTH TWICE DAILY (APPROXIMATELY 12 HOURS APART). MAY BE TAKEN WITH OR WITHOUT FOOD. DO NOT CUT OR  CRUSH. STORE AT ROOM T 180 tablet 3   Cholecalciferol (VITAMIN D3) 2000 UNITS TABS Take 1 capsule by mouth daily.     COPAXONE 40 MG/ML SOSY 40 mg.     docusate sodium (COLACE) 100 MG capsule Take 100 mg by mouth daily as needed for mild constipation.     ELIQUIS 5 MG TABS tablet TAKE 1 TABLET BY MOUTH TWICE DAILY. 180 tablet 0   ferrous sulfate 325 (65 FE) MG tablet Take 1 tablet (325 mg total) by mouth 3 (three) times daily with meals. 90 tablet 1   folic acid (FOLVITE) 1 MG tablet Take 1 mg by mouth daily.       furosemide (LASIX) 40 MG tablet TAKE 1 TABLET BY MOUTH TWICE DAILY. MAY TAKE AN ADDITIONAL  TABLETS INTHE P.M. AS NEEDED. 180 tablet 0   losartan (COZAAR) 100 MG tablet TAKE 1 TABLET ONCE DAILY. 30 tablet 11   Multiple Vitamin (MULTIVITAMIN WITH MINERALS) TABS tablet Take 1 tablet by mouth daily. Centrum for men.     QUEtiapine (SEROQUEL) 25 MG tablet Take 25 mg by mouth at bedtime as needed.      spironolactone (ALDACTONE) 25 MG tablet TAKE 1 TABLET EACH DAY. 30 tablet 2   triamcinolone cream (KENALOG) 0.1 % Apply 1 application topically daily as needed (rash).      Dimethyl Fumarate (TECFIDERA) 240 MG CPDR Take 1 capsule by mouth 2 (two) times daily.     No facility-administered medications prior to visit.     PAST MEDICAL HISTORY: Past Medical History:  Diagnosis Date   Anemia    Aneurysm of aortic arch ([place]) 04/04/2012   Chronic aneurysmal dilatation of aortic arch with chronic type A aortic dissection, s/p replacement of ascending thoracic aorta   Aortic dissection ([place]) 11/05/1998   S/P emergency repair of acute type A aortic dissection with resuspension of native aortic valve   Aortic dissection, thoracic (HCC)    Aortic root aneurysm (HCC)    Atrial fibrillation ([place]) 01/30/2013   Atrial fibrillation    Bilateral lower extremity edema    BPH (benign prostatic hyperplasia)    CHF (congestive heart failure) ([place]) 02-11-1947   2D Echo - EF 50-55%, mild-moderately dilated right ventricle, mild-moderate tricuspid valve regurgitation, moderately dilated right atrium   Depression    Dyslipidemia    Exertional shortness of breath    "for awhile now" (09/11/2013)   Hemorrhoids    History of blood transfusion    Hypertension    Mitral regurgitation 02/20/2013   Multiple sclerosis (HCC)    Neuromuscular disorder (HCC)    Pleural effusion, right, large 01/30/2013   Severe tricuspid valve regurgitation 01/30/2013   Severe tricuspid regurg by recent 2-D echo with a dilated tricuspid annulus, moderate pulmonary hypertension and biatrial enlargement     Thrombocytopenia ([place])     PAST SURGICAL HISTORY: Past Surgical History:  Procedure Laterality Date   HEMORRHOID SURGERY  2009   Internal, external hemorrhoidectomy, general anesthesia,prone position. [Other]   Open reduction and internal fixation of right distal radius fracture using [employer] distal radius volar locking plate.  07/2005   Dr Ninfa Linden   Repair of acute type A aortic dissection with resuspension of native aortic valve  10/15/1998   Dr Roxy Manns   TEE WITHOUT CARDIOVERSION N/A 02/17/2013   Procedure: TRANSESOPHAGEAL ECHOCARDIOGRAM (TEE);  Surgeon: [name], MD;  Location: [facility] ENDOSCOPY;  Service: Cardiovascular;  Laterality: N/A;    FAMILY HISTORY: Family History  Problem Relation Age of  Onset   Thyroid cancer Father        smoked   Heart attack Mother    Lung cancer Brother        smoked   Emphysema Brother        smoked    SOCIAL HISTORY: Social History   Socioeconomic History   Marital status: Married    Spouse name: Debbie   Number of children: 2   Years of education: Ph.D   Highest education level: Not on file  Occupational History   Occupation: Retired Professor    Fish farm manager: [employer]    Comment: Water engineer strain: Not hard at all   Food insecurity    Worry: Never true    Inability: Never true   Transportation needs    Medical: No    Non-medical: No  Tobacco Use   Smoking status: Never Smoker   Smokeless tobacco: Never Used  Substance and Sexual Activity   Alcohol use: Yes    Comment: 09/11/2013 "might have a drink once/month"   Drug use: No   Sexual activity: Not Currently  Lifestyle   Physical activity    Days per week: Not on file    Minutes per session: Not on file   Stress: Not on file  Relationships   Social connections    Talks on phone: Not on file    Gets together: Not on file    Attends religious service: Not on file    Active member of club or  organization: Not on file    Attends meetings of clubs or organizations: Not on file    Relationship status: Not on file   Intimate partner violence    Fear of current or ex partner: Not on file    Emotionally abused: Not on file    Physically abused: Not on file    Forced sexual activity: Not on file  Other Topics Concern   Not on file  Social History Narrative   Patient lives at home with spouse.   Caffeine Use: eat a lot of chocolate, sodas, coffee and tea occasionally     PHYSICAL EXAM  Vitals:   04/18/19 1137  BP: (!) 142/67  Pulse: 79  Temp: 97.8 F (36.6 C)  Weight: 159 lb (72.1 kg)  Height: '5\' 8"'  (1.727 m)   Body mass index is 24.18 kg/m.  Generalized: Well developed, in no acute distress  Neck: Supple, no carotid bruits  Cardiac: IRREG RATE RHYTHM, SYS MURMUR  Neurological examination   MENTAL STATUS: awake, alert, language fluent, comprehension intact, naming intact   CRANIAL NERVE: pupils equal and reactive to light, visual fields full to confrontation, extraocular muscles: SACCADIC BREAKDOWN OF SMOOTH PURSUIT; END GAZE NYSTAGMUS; PAST POINTING ON SACCADES. Facial sensation and WITH DECR RIGHT EYE CLOSURE WEAKNESS AND RIGHT LOWER FACIAL STRENGTH. SYNKINESES ON RIGHT FACE. Uvula midline, shoulder shrug symmetric, tongue midline.   MOTOR: INCREASED TONE IN BLE > BUE (RIGHT > LEFT). BUE (DELTOID 4+, TRICEP 4, BICEP 5, GRIP 4+), RIGHT LEG (HF 2, KE 3, KF 2, DF 1-2), LEFT LEG (HF 3, KE/KF 3, DF 3)  SENSORY: DECR IN RLE TO ALL MODALITIES.   COORDINATION: finger-nose-finger, fine finger movements normal   REFLEXES: BUE 3 (R>L), RLE 3, LLE 2.   GAIT/STATION: DIFF STANDING FROM CHAIR. USES A WALKER. SPASTIC GAIT. RIGHT CIRCUMDUCTION. UNSTEADY.   DIAGNOSTIC DATA (LABS, IMAGING, TESTING) - I reviewed patient records, labs, notes, testing and  imaging myself where available.  Lab Results  Component Value Date   WBC 5.5 01/02/2019   HGB 12.9 (L) 01/02/2019    HCT 39.2 01/02/2019   MCV 85 01/02/2019   PLT 144 (L) 01/02/2019   CMP Latest Ref Rng & Units 11/07/2018 08/22/2018 07/06/2018  Glucose 65 - 99 mg/dL - 103(H) 96  BUN 8 - 27 mg/dL - 47(H) 36(H)  Creatinine 0.76 - 1.27 mg/dL - 1.49(H) 1.52(H)  Sodium 134 - 144 mmol/L - 141 141  Potassium 3.5 - 5.2 mmol/L - 4.7 4.1  Chloride 96 - 106 mmol/L - 101 106  CO2 20 - 29 mmol/L - 24 29  Calcium 8.6 - 10.2 mg/dL - 9.9 9.9  Total Protein 6.0 - 8.5 g/dL 7.6 7.1 -  Total Bilirubin 0.0 - 1.2 mg/dL 0.6 0.4 -  Alkaline Phos 39 - 117 IU/L 185(H) 178(H) -  AST 0 - 40 IU/L 30 25 -  ALT 0 - 44 IU/L 33 29 -   lymph#  Date Value Ref Range Status  02/10/2012 0.9 0.9 - 3.3 10e3/uL Final  08/10/2011 1.1 0.9 - 3.3 10e3/uL Final  03/12/2010 0.8 (L) 0.9 - 3.3 10e3/uL Final   Lymphocytes Absolute  Date Value Ref Range Status  01/02/2019 0.4 (L) 0.7 - 3.1 x10E3/uL Final  12/06/2018 0.5 (L) 0.7 - 3.1 x10E3/uL Final  11/21/2018 0.3 (L) 0.7 - 3.1 x10E3/uL Final    11/16/12 MRI brain - There are multiple periventricular and subcortical and pontine chronic demyelinating plaques. There are several T1 black holes. Mild diffuse atrophy. No acute plaques. In comparison to MRI from 03/26/10, there is no significant change.   08/02/13 MRI brain - multiple brainstem, periventricular, corpus callosal and subcortical white matter hyperintensities compatible with chronic lesions of multiple sclerosis. No enhancing lesions are noted. The presence of T1 black holes and significant atrophy of corpus callosum and cortex indicates chronic disease.  11/16/12 MRI cervical spine - Multiple chronic demyelinating plaques at C2-3, C3-4 and C5-6. No acute plaques. At C5-6: Disc bulging with facet hypertrophy with moderate spinal stenosis, severe right and moderate left foraminal stenosis. At C3-4, C4-5: Disc bulging with mild spinal stenosis and biforaminal stenosis. In comparison to MRI from 03/26/10 there is no significant change.   01/20/13  JCV antibody - positive, index 3.37 (H)    ASSESSMENT AND PLAN  73 y.o. old male with Hypertension; Anemia; Depression; Multiple sclerosis; Thrombocytopenia; Dyslipidemia; Aortic dissection, thoracic; Aortic dissection (11/05/1998); Aneurysm of aortic arch (04/04/2012); Chest pain (11/18/2009); CHF (congestive heart failure), Atrial fibrillation (01/30/2013); Pleural effusion, right, large (01/30/2013); Severe tricuspid valve regurgitation (01/30/2013); Mitral regurgitation (02/20/2013); and Lower extremity edema (02/20/2013) here with multiple sclerosis since 1996. Initially on betaseron, now on tecfidera.  Dx:  1. MS (multiple sclerosis) ([place])   2. Thrombocytopenia ([place])      PLAN:  MULTIPLE SCLEROSIS - continue copaxone - continue ampyra - continue swimming and PT exercises - high fall risk and on anti-coagulation for atrial fibrillation; precautions reviewed with patient   Orders Placed This Encounter  Procedures   CBC with Differential/Platelet   Return in about 8 months (around 12/17/2019).     Penni Bombard, MD 12/15/5954, 38:75 AM Certified in Neurology, Neurophysiology and Neuroimaging  [facility] [address] [place], [place] [address] [phone]

## 2019-04-19 LAB — CBC WITH DIFFERENTIAL/PLATELET
Basophils Absolute: 0.1 10*3/uL (ref 0.0–0.2)
Basos: 1 %
EOS (ABSOLUTE): 0.2 10*3/uL (ref 0.0–0.4)
Eos: 4 %
Hematocrit: 37.7 % (ref 37.5–51.0)
Hemoglobin: 12.6 g/dL — ABNORMAL LOW (ref 13.0–17.7)
Immature Grans (Abs): 0 10*3/uL (ref 0.0–0.1)
Immature Granulocytes: 0 %
Lymphocytes Absolute: 0.3 10*3/uL — ABNORMAL LOW (ref 0.7–3.1)
Lymphs: 6 %
MCH: 28.4 pg (ref 26.6–33.0)
MCHC: 33.4 g/dL (ref 31.5–35.7)
MCV: 85 fL (ref 79–97)
Monocytes Absolute: 0.4 10*3/uL (ref 0.1–0.9)
Monocytes: 7 %
Neutrophils Absolute: 4.5 10*3/uL (ref 1.4–7.0)
Neutrophils: 82 %
Platelets: 136 10*3/uL — ABNORMAL LOW (ref 150–450)
RBC: 4.43 x10E6/uL (ref 4.14–5.80)
RDW: 13.1 % (ref 11.6–15.4)
WBC: 5.6 10*3/uL (ref 3.4–10.8)

## 2019-04-20 ENCOUNTER — Telehealth: Payer: Self-pay | Admitting: *Deleted

## 2019-04-20 NOTE — Telephone Encounter (Signed)
Spoke with patient and informed his labs are stable. He asked if lymphocytes were improved, and I advised they are lower. He had no questions, verbalized understanding, appreciation.

## 2019-04-24 ENCOUNTER — Telehealth: Payer: Self-pay | Admitting: *Deleted

## 2019-04-24 NOTE — Telephone Encounter (Signed)
Received [facility] from [facility]: Copaxone 40 mg/mL shipped on 04/18/2019.

## 2019-05-01 ENCOUNTER — Ambulatory Visit (INDEPENDENT_AMBULATORY_CARE_PROVIDER_SITE_OTHER): Payer: Medicare Other | Admitting: Psychology

## 2019-05-01 DIAGNOSIS — F4323 Adjustment disorder with mixed anxiety and depressed mood: Secondary | ICD-10-CM

## 2019-05-03 ENCOUNTER — Other Ambulatory Visit (HOSPITAL_COMMUNITY): Payer: Self-pay | Admitting: Internal Medicine

## 2019-05-08 NOTE — Telephone Encounter (Signed)
Received [facility]: Ampyra 10 mg shipped on 05/03/19.

## 2019-05-15 ENCOUNTER — Ambulatory Visit: Payer: BC Managed Care – PPO | Admitting: Psychology

## 2019-05-17 ENCOUNTER — Encounter: Payer: Self-pay | Admitting: Orthopaedic Surgery

## 2019-05-17 ENCOUNTER — Ambulatory Visit (INDEPENDENT_AMBULATORY_CARE_PROVIDER_SITE_OTHER): Payer: Medicare Other

## 2019-05-17 ENCOUNTER — Ambulatory Visit (INDEPENDENT_AMBULATORY_CARE_PROVIDER_SITE_OTHER): Payer: Medicare Other | Admitting: Orthopaedic Surgery

## 2019-05-17 DIAGNOSIS — M79641 Pain in right hand: Secondary | ICD-10-CM | POA: Diagnosis not present

## 2019-05-17 DIAGNOSIS — M65331 Trigger finger, right middle finger: Secondary | ICD-10-CM

## 2019-05-17 MED ORDER — METHYLPREDNISOLONE ACETATE 40 MG/ML IJ SUSP
40.0000 mg | INTRAMUSCULAR | Status: AC | PRN
  Administered 2019-05-17: 40 mg

## 2019-05-17 MED ORDER — LIDOCAINE HCL 1 % IJ SOLN
1.0000 mL | INTRAMUSCULAR | Status: AC | PRN
  Administered 2019-05-17: 1 mL

## 2019-05-17 NOTE — Progress Notes (Signed)
Office Visit Note   Patient: [name]           Date of Birth: 1945-11-08           MRN: [id number] Visit Date: 05/17/2019              Requested by: Levin Erp, MD [place], [address],  [place] [address] PCP: Levin Erp, MD   Assessment & Plan: Visit Diagnoses:  1. Pain in right hand   2. Trigger finger, right middle finger     Plan: He reports that he does have Voltaren gel at home.  I want him to place a small amount over the A1 pulley of his middle finger on the right side at least twice daily and massages in the area.  I did offer him a steroid injection over the A1 pulley and explained the risks and benefits of this injection.  He agreed to try this and he tolerated well.  Follow-up will be as needed.  All question concerns were answered and addressed.  If the triggering persists or gets worse or does come back he can always come in for repeat injection in at least 4 to 6 weeks if needed.  Follow-Up Instructions: Return if symptoms worsen or fail to improve.   Orders:  Orders Placed This Encounter  Procedures  . Hand/UE Inj  . XR Hand Complete Right   No orders of the defined types were placed in this encounter.     Procedures: Hand/UE Inj for trigger finger on 05/17/2019 4:12 PM Medications: 1 mL lidocaine 1 %; 40 mg methylPREDNISolone acetate 40 MG/ML      Clinical Data: No additional findings.   Subjective: Chief Complaint  Patient presents with  . Right Hand - Pain  The patient is well-known to me.  He does use a rolling walker to get around is been developing right dominant hand pain.  He points the middle finger source of his pain and he states this is in the palm of his hand.  There is been some triggering as well.  He denies any numbness and tingling or any injury.  He has remote history of a distal radius fracture that we had a plate with a volar plate.  He is not a diabetic.  HPI  Review of Systems He currently denies any  headache, chest pain, shortness of breath, fever, chills, nausea, vomiting  Objective: Vital Signs: There were no vitals taken for this visit.  Physical Exam He is alert and orient x3 and in no acute distress Ortho Exam Examination of his right hand does show pain over the middle finger A1 pulley and active triggering.  The remainder of his hand exam is normal.  There is no numbness and tingling in his hand and his hand is well-perfused.  There is a well-healed volar surgical incision. Specialty Comments:  No specialty comments available.  Imaging: Xr Hand Complete Right  Result Date: 05/17/2019 3 views of the right hand showed no acute injuries or findings.  This specifically shows no acute findings around the middle finger where the patient is having triggering and pain.  There is previous surgery of the distal radius fracture with a volar plate that is completely healed with good alignment.    PMFS History: Patient Active Problem List   Diagnosis Date Noted  . Aortic root aneurysm ([place])   . Aneurysm of aortic arch ([place]) 12/31/2014  . Tricuspid regurgitation 10/08/2014  . RVF (right ventricular  failure) ([place]) 10/08/2014  . Chronic diastolic congestive heart failure ([place]) 10/09/2013  . Sinus pause 09/16/2013  . Anemia due to blood loss, acute in setting of supratheraputic INR 09/11/13 09/12/2013  . 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 Acute renal insufficiency 09/12/2013  . Anasarca 09/12/2013  . Anemia 09/12/2013  . Bilateral leg edema 09/11/2013  . Acute right-sided congestive heart failure ([place]) 09/11/2013  . Unstable gait 09/11/2013  . Dissection of aorta, thoracic- surgery Feb 2000 02/20/2013  . Mitral regurgitation 02/20/2013  . Lower extremity edema 02/20/2013  . Volume overload 01/30/2013  . Severe tricuspid valve regurgitation 01/30/2013  . Pleural effusion, right, large 01/30/2013  . MS (multiple sclerosis) ([place]) 01/13/2013  .  Thrombocytopenia ([place]) 07/26/2011  . Aortic dissection- chronic abd dissection  11/05/1998   Past Medical History:  Diagnosis Date  . Anemia   . Aneurysm of aortic arch ([place]) 04/04/2012   Chronic aneurysmal dilatation of aortic arch with chronic type A aortic dissection, s/p replacement of ascending thoracic aorta  . Aortic dissection ([place]) 11/05/1998   S/P emergency repair of acute type A aortic dissection with resuspension of native aortic valve  . Aortic dissection, thoracic ([place])   . Aortic root aneurysm ([place])   . Atrial fibrillation ([place]) 01/30/2013   Atrial fibrillation   . Bilateral lower extremity edema   . BPH (benign prostatic hyperplasia)   . CHF (congestive heart failure) ([place]) 04-25-47   2D Echo - EF 50-55%, mild-moderately dilated right ventricle, mild-moderate tricuspid valve regurgitation, moderately dilated right atrium  . Depression   . Dyslipidemia   . Exertional shortness of breath    "for awhile now" (09/11/2013)  . Hemorrhoids   . History of blood transfusion   . Hypertension   . Mitral regurgitation 02/20/2013  . Multiple sclerosis ([place])   . Neuromuscular disorder ([place])   . Pleural effusion, right, large 01/30/2013  . Severe tricuspid valve regurgitation 01/30/2013   Severe tricuspid regurg by recent 2-D echo with a dilated tricuspid annulus, moderate pulmonary hypertension and biatrial enlargement   . Thrombocytopenia ([place])     Family History  Problem Relation Age of Onset  . Thyroid cancer Father        smoked  . Heart attack Mother   . Lung cancer Brother        smoked  . Emphysema Brother        smoked    Past Surgical History:  Procedure Laterality Date  . HEMORRHOID SURGERY  2009   Internal, external hemorrhoidectomy, general anesthesia,prone position. [Other]  . Open reduction and internal fixation of right distal radius fracture using [employer] distal radius volar locking plate.  07/2005   Dr Ninfa Linden  . Repair of acute type A aortic  dissection with resuspension of native aortic valve  10/15/1998   Dr Roxy Manns  . TEE WITHOUT CARDIOVERSION N/A 02/17/2013   Procedure: TRANSESOPHAGEAL ECHOCARDIOGRAM (TEE);  Surgeon: [name], MD;  Location: [facility] ENDOSCOPY;  Service: Cardiovascular;  Laterality: N/A;   Social History   Occupational History  . Occupation: Retired Professor    Fish farm manager: [employer]    Comment: [employer]  Tobacco Use  . Smoking status: Never Smoker  . Smokeless tobacco: Never Used  Substance and Sexual Activity  . Alcohol use: Yes    Comment: 09/11/2013 "might have a drink once/month"  . Drug use: No  . Sexual activity: Not Currently

## 2019-05-18 ENCOUNTER — Ambulatory Visit (INDEPENDENT_AMBULATORY_CARE_PROVIDER_SITE_OTHER): Payer: Medicare Other | Admitting: Psychology

## 2019-05-18 DIAGNOSIS — F4323 Adjustment disorder with mixed anxiety and depressed mood: Secondary | ICD-10-CM | POA: Diagnosis not present

## 2019-05-29 ENCOUNTER — Ambulatory Visit (INDEPENDENT_AMBULATORY_CARE_PROVIDER_SITE_OTHER): Payer: Medicare Other | Admitting: Psychology

## 2019-05-29 DIAGNOSIS — F4323 Adjustment disorder with mixed anxiety and depressed mood: Secondary | ICD-10-CM

## 2019-06-12 ENCOUNTER — Ambulatory Visit: Payer: Medicare Other | Admitting: Psychology

## 2019-06-26 ENCOUNTER — Ambulatory Visit (INDEPENDENT_AMBULATORY_CARE_PROVIDER_SITE_OTHER): Payer: Medicare Other | Admitting: Psychology

## 2019-06-26 DIAGNOSIS — F4323 Adjustment disorder with mixed anxiety and depressed mood: Secondary | ICD-10-CM

## 2019-07-10 ENCOUNTER — Ambulatory Visit (INDEPENDENT_AMBULATORY_CARE_PROVIDER_SITE_OTHER): Payer: Medicare Other | Admitting: Psychology

## 2019-07-10 DIAGNOSIS — F4323 Adjustment disorder with mixed anxiety and depressed mood: Secondary | ICD-10-CM

## 2019-07-24 ENCOUNTER — Telehealth: Payer: Self-pay | Admitting: *Deleted

## 2019-07-24 ENCOUNTER — Ambulatory Visit (INDEPENDENT_AMBULATORY_CARE_PROVIDER_SITE_OTHER): Payer: Medicare Other | Admitting: Psychology

## 2019-07-24 DIAGNOSIS — F4323 Adjustment disorder with mixed anxiety and depressed mood: Secondary | ICD-10-CM

## 2019-07-24 NOTE — Telephone Encounter (Signed)
[facility], re: Co[paxone PFS 40 mg/mL shipped on 07/17/2019.

## 2019-08-03 ENCOUNTER — Other Ambulatory Visit (HOSPITAL_COMMUNITY): Payer: Self-pay | Admitting: Internal Medicine

## 2019-08-07 ENCOUNTER — Ambulatory Visit (INDEPENDENT_AMBULATORY_CARE_PROVIDER_SITE_OTHER): Payer: Medicare Other | Admitting: Psychology

## 2019-08-07 ENCOUNTER — Telehealth: Payer: Self-pay | Admitting: *Deleted

## 2019-08-07 DIAGNOSIS — F4323 Adjustment disorder with mixed anxiety and depressed mood: Secondary | ICD-10-CM

## 2019-08-07 NOTE — Telephone Encounter (Signed)
Received [facility]: Ampyra 10 mg shipped on 07/31/19.

## 2019-08-21 ENCOUNTER — Ambulatory Visit (INDEPENDENT_AMBULATORY_CARE_PROVIDER_SITE_OTHER): Payer: Medicare Other | Admitting: Psychology

## 2019-08-21 DIAGNOSIS — F4323 Adjustment disorder with mixed anxiety and depressed mood: Secondary | ICD-10-CM | POA: Diagnosis not present

## 2019-09-01 ENCOUNTER — Other Ambulatory Visit (HOSPITAL_COMMUNITY): Payer: Self-pay | Admitting: Internal Medicine

## 2019-09-04 ENCOUNTER — Ambulatory Visit: Payer: Medicare Other | Admitting: Psychology

## 2019-09-05 ENCOUNTER — Ambulatory Visit: Payer: Medicare Other | Admitting: Family Medicine

## 2019-09-05 ENCOUNTER — Telehealth: Payer: Self-pay | Admitting: Family Medicine

## 2019-09-05 NOTE — Telephone Encounter (Signed)
Patient called requesting Dr. Junius Roads contact [place] for patient to be transported to our [facility] office. Patient states he is in severe pain and wish not to miss appointment. Patient states this is of high importance. Patient states Dr. Junius Roads can fax authorization at [phone] or call authorization in at [phone]. Patient appointment is at 4 pm. Patient placed call to request at 3:10 pm. Patient also requested if appointment is missed can a prescription for pain be called in at [facility] in [place]. Patient asked to be called back at [phone].

## 2019-09-06 ENCOUNTER — Ambulatory Visit: Payer: Medicare Other | Admitting: Orthopaedic Surgery

## 2019-09-06 ENCOUNTER — Ambulatory Visit (INDEPENDENT_AMBULATORY_CARE_PROVIDER_SITE_OTHER): Payer: Medicare Other | Admitting: Family Medicine

## 2019-09-06 ENCOUNTER — Encounter: Payer: Self-pay | Admitting: Family Medicine

## 2019-09-06 ENCOUNTER — Other Ambulatory Visit: Payer: Self-pay

## 2019-09-06 DIAGNOSIS — M79604 Pain in right leg: Secondary | ICD-10-CM | POA: Diagnosis not present

## 2019-09-06 DIAGNOSIS — M25561 Pain in right knee: Secondary | ICD-10-CM

## 2019-09-06 MED ORDER — TRAMADOL HCL 50 MG PO TABS: 50.0000 mg | ORAL_TABLET | Freq: Four times a day (QID) | ORAL | 0 refills | Status: DC | PRN

## 2019-09-06 NOTE — Telephone Encounter (Signed)
Fax sent to [facility] stating that I called [employer] and they do NOT require auth/precert for non-emergent transport; there should be no issue with transportation today

## 2019-09-06 NOTE — Progress Notes (Signed)
Office Visit Note   Patient: [name]           Date of Birth: 11-04-1945           MRN: [id number] Visit Date: 09/06/2019 Requested by: Levin Erp, MD [address], [address],  [place] [address] PCP: Levin Erp, MD  Subjective: Chief Complaint  Patient presents with  . Right Leg - Pain    Spasms and pain in the right leg, with a a flare up of MS. Has not been able to bear weight on that leg - today is a bit better. Could not straighten the leg yesterday - better today.    HPI: He is here with right leg pain.  Symptoms started a couple days ago.  He thinks it is from a flareup of MS.  He has had some spasms in his leg, unable to bear full weight.  Today it feels a little bit better.  He had to call EMS to carry him downstairs, and to bring him here.  No history of gout.  Denies fevers or chills.              ROS:   All other systems were reviewed and are negative.  Objective: Vital Signs: There were no vitals taken for this visit.  Physical Exam:  General:  Alert and oriented, in no acute distress. Pulm:  Breathing unlabored. Psy:  Normal mood, congruent affect. Skin: No rash, slight erythema around the knee. Right knee: 1-2+ effusion with no warmth.  Full active extension, flexion of about 90 degrees.  No significant joint line tenderness.  Imaging: None today  Assessment & Plan: 1.  Right knee pain and effusion, possibly DJD. -Discussed with patient, he has had good response to steroid injections in the past and would like to try 1 today.  He will come back if symptoms persist.  Tramadol as needed for pain.     Procedures: Right knee steroid injection: After sterile prep with Betadine, injected 5 cc 1% lidocaine without epinephrine and 40 mg methylprednisolone from superolateral approach, a flash of clear yellow synovial fluid was obtained prior to injection.    PMFS History: Patient Active Problem List   Diagnosis Date Noted  . Aortic root  aneurysm ([place])   . Aneurysm of aortic arch ([place]) 12/31/2014  . Tricuspid regurgitation 10/08/2014  . RVF (right ventricular failure) ([place]) 10/08/2014  . Chronic diastolic congestive heart failure ([place]) 10/09/2013  . Sinus pause 09/16/2013  . Anemia due to blood loss, acute in setting of supratheraputic INR 09/11/13 09/12/2013  . 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 Acute renal insufficiency 09/12/2013  . Anasarca 09/12/2013  . Anemia 09/12/2013  . Bilateral leg edema 09/11/2013  . Acute right-sided congestive heart failure ([place]) 09/11/2013  . Unstable gait 09/11/2013  . Dissection of aorta, thoracic- surgery Feb 2000 02/20/2013  . Mitral regurgitation 02/20/2013  . Lower extremity edema 02/20/2013  . Volume overload 01/30/2013  . Severe tricuspid valve regurgitation 01/30/2013  . Pleural effusion, right, large 01/30/2013  . MS (multiple sclerosis) ([place]) 01/13/2013  . Thrombocytopenia ([place]) 07/26/2011  . Aortic dissection- chronic abd dissection  11/05/1998   Past Medical History:  Diagnosis Date  . Anemia   . Aneurysm of aortic arch ([place]) 04/04/2012   Chronic aneurysmal dilatation of aortic arch with chronic type A aortic dissection, s/p replacement of ascending thoracic aorta  . Aortic dissection ([place]) 11/05/1998   S/P emergency repair of acute  type A aortic dissection with resuspension of native aortic valve  . Aortic dissection, thoracic ([place])   . Aortic root aneurysm ([place])   . Atrial fibrillation ([place]) 01/30/2013   Atrial fibrillation   . Bilateral lower extremity edema   . BPH (benign prostatic hyperplasia)   . CHF (congestive heart failure) ([place]) [birth year]   2D Echo - EF 50-55%, mild-moderately dilated right ventricle, mild-moderate tricuspid valve regurgitation, moderately dilated right atrium  . Depression   . Dyslipidemia   . Exertional shortness of breath    "for awhile now" (09/11/2013)  . Hemorrhoids   . History of blood transfusion   .  Hypertension   . Mitral regurgitation 02/20/2013  . Multiple sclerosis ([place])   . Neuromuscular disorder ([place])   . Pleural effusion, right, large 01/30/2013  . Severe tricuspid valve regurgitation 01/30/2013   Severe tricuspid regurg by recent 2-D echo with a dilated tricuspid annulus, moderate pulmonary hypertension and biatrial enlargement   . Thrombocytopenia ([place])     Family History  Problem Relation Age of Onset  . Thyroid cancer Father        smoked  . Heart attack Mother   . Lung cancer Brother        smoked  . Emphysema Brother        smoked    Past Surgical History:  Procedure Laterality Date  . HEMORRHOID SURGERY  2009   Internal, external hemorrhoidectomy, general anesthesia,prone position. [Other]  . Open reduction and internal fixation of right distal radius fracture using [employer] distal radius volar locking plate.  07/2005   Dr Ninfa Linden  . Repair of acute type A aortic dissection with resuspension of native aortic valve  10/15/1998   Dr Roxy Manns  . TEE WITHOUT CARDIOVERSION N/A 02/17/2013   Procedure: TRANSESOPHAGEAL ECHOCARDIOGRAM (TEE);  Surgeon: [name], MD;  Location: [facility] ENDOSCOPY;  Service: Cardiovascular;  Laterality: N/A;   Social History   Occupational History  . Occupation: Retired Professor    Fish farm manager: [employer]    Comment: Statistics  Tobacco Use  . Smoking status: Never Smoker  . Smokeless tobacco: Never Used  Substance and Sexual Activity  . Alcohol use: Yes    Comment: 09/11/2013 "might have a drink once/month"  . Drug use: No  . Sexual activity: Not Currently

## 2019-09-11 ENCOUNTER — Telehealth: Payer: Self-pay | Admitting: *Deleted

## 2019-09-11 NOTE — Telephone Encounter (Signed)
Received call from patient who stated he misunderstood my message and thought he was being worked in today. He stated that 911 was going to take him to ED but he thought he could be seen today. I advised him of my messages that clearly stated Dr [name] isn't in office this week, and that he agreed to be scheduled Jan 4th. He stated EMS told him the ED "was dangerous" for him. I advised he call his PCP to be seen emergently or go to ED to be evaluated. He asked if any other MD could see him now. I advised him that they cannot. I advised I can send a message to work in MD and call him with the reply. I advised he may need imaging which cannot be done in our office. He verbalized understanding, agreement to plan.

## 2019-09-12 NOTE — Telephone Encounter (Signed)
Called patient to check on him. He didn't go to PCP or ED yesterday. He stated he is changing PCP (PCP retiring) , and he has appointment with new PCP 09/14/19. He stated he still wants to see Dr [name]; I advised his appointment we scheduled yesterday had been changed. He agreed to come in for 3 pm FU next Mon, stated that time is better for him. Patient verbalized understanding, appreciation.

## 2019-09-18 ENCOUNTER — Ambulatory Visit: Payer: Medicare Other | Admitting: Psychology

## 2019-09-18 ENCOUNTER — Other Ambulatory Visit: Payer: Self-pay

## 2019-09-18 ENCOUNTER — Ambulatory Visit: Payer: Self-pay | Admitting: Diagnostic Neuroimaging

## 2019-09-18 ENCOUNTER — Ambulatory Visit (INDEPENDENT_AMBULATORY_CARE_PROVIDER_SITE_OTHER): Payer: Medicare Other | Admitting: Diagnostic Neuroimaging

## 2019-09-18 ENCOUNTER — Telehealth (HOSPITAL_COMMUNITY): Payer: Self-pay | Admitting: *Deleted

## 2019-09-18 ENCOUNTER — Encounter: Payer: Self-pay | Admitting: Diagnostic Neuroimaging

## 2019-09-18 VITALS — BP 125/63 | HR 70 | Temp 97.8°F | Ht 68.0 in | Wt 169.0 lb

## 2019-09-18 DIAGNOSIS — D696 Thrombocytopenia, unspecified: Secondary | ICD-10-CM

## 2019-09-18 DIAGNOSIS — G35 Multiple sclerosis: Secondary | ICD-10-CM

## 2019-09-18 DIAGNOSIS — R7989 Other specified abnormal findings of blood chemistry: Secondary | ICD-10-CM

## 2019-09-18 MED ORDER — BACLOFEN 10 MG PO TABS: 5.0000 mg | ORAL_TABLET | Freq: Two times a day (BID) | ORAL | 6 refills | Status: DC

## 2019-09-18 NOTE — Patient Instructions (Addendum)
MULTIPLE SCLEROSIS  - continue copaxone  - continue ampyra  - start baclofen 5mg  twice a day; may increase to 10mg  twice a day for muscle spasms  - continue swimming and PT exercises

## 2019-09-18 NOTE — Progress Notes (Signed)
PATIENT: [name] DOB: Jan 30, 1946   REASON FOR VISIT: follow up for MS HISTORY FROM: patient and wife  Chief Complaint  Patient presents with  . Multiple Sclerosis    rm 8, wife- [name], sooner FU req due to increased weakness in past 2 weeks, episode affected my knee, couldn't straighten it, was so painful, saw orthopedic who drained knee- improved but still difficulty walking"     HISTORY OF PRESENT ILLNESS:  UPDATE (09/18/19, VRP): Since last visit, doing well until 2 weeks ago (mild cold sxs x 1 day; then generalized weakness). Right knee locked up; saw ortho and it was drained and injected --> now better.   UPDATE (04/18/19, VRP): Since last visit, doing WELL. Symptoms are stable / improved slightly. No alleviating or aggravating factors. Tolerating copaxone.  Now back to swimming exercises.  UPDATE (10/05/18, VRP): Since last visit, doing about the same. Symptoms are stable. Patient wants to consider staying on tecfidera, in spite of low lymphocyte count and risk of PML. No alleviating or aggravating factors.  UPDATE (08/22/18, VRP): Since last visit, symptoms are slightly progressed (gait issues). No alleviating or aggravating factors. Tolerating tecfidera and ampyra. Staying active with swimming.  UPDATE (08/18/17, VRP): Since last visit, tolerating meds. No alleviating or aggravating factors. Ampyra helping somewhat. Having to lean on walker more.   UPDATE 08/18/16: Since last visit, tolerating meds. No new issues. Some slight subjective progression of symptoms. Had a fall recently with subsequent right forearm bruising.   UPDATE 02/11/16: Since last visit, tolerating tecfidera. Some slow progression. Asking about new MS medications.  UPDATE 08/13/15: Tolerating meds. More urinary issues. Using walker mainly.   UPDATE 04/16/15: Since last visit, patient is stable. Getting home visits with palliative care.   UPDATE 02/05/15: Discussed palliative care consult and options.    UPDATE 08/02/14: Since last visit, feels stable. Tolerating tecfidera and ampyra. No new events. Uses cane and walker.  Swimming more. Wife notes memory issues slightly worse.   UPDATE 01/31/14: Since last visit, continues on tecfidera. No progression of neuro sxs or MS sxs. Asking about driving again. Has not driven in last 3-4 months. Ongoing medical mgmt of his cardiac issues, and he is not a surgical candidate.  UPDATE 10/25/13 (LL): Patient comes in for revisit, since last visit he had worsening right-sided HF and was hospitalized.  He was seen by [name] for surgical consultation to consider tricuspid valve repair and possible Maze procedure.  He was referred to the advanced heart failure clinic who has been adjusting his lasix to manage his lower extremity edema and shortness of breath.  He is to be seen by [name] again for follow up in another month, and says [name] felt that surgery was needed.  He comes in today to discuss with [name] how his MS would be affected by having the surgery.  His wife feels like he is having more difficulty walking since last visit, but he has not had any falls.  He is tolerating Tecfidera and Ampyra well.  UPDATE 07/19/13 (VP): Patient continues to have progressive deterioration in cognitive ability, memory, mood lability, gait stability. Patient is on tecfidera now, and tolerating without side effects. Patient feels when he came off of Betaseron he had some accelerated progression of MS that this has slowed down since starting tecfidera. Unclear whether patient's current level of functioning is on the same trajectory as the decline that was noted while patient was on Betaseron, or whether there  has been some acceleration of deterioration. Other factors include some marital discord and comorbid depression. Other contributing factors include his cardiac issues including atrial fibrillation, on Coumadin, as well as severe tricuspid valve regurgitation  leading to peripheral edema. Unfortunately patient is not felt to be a good surgical candidate.   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 The patient is accompanied by his wife. Pt. States he is doing worse since his last visit in Feb. He is walking slower and having more difficulty standing from a seated position. 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 [name] had talked to him about changing MS medications and he thinks he wants to. Having more memory problems. Continuing on betaseron right now.   PRIOR HPI (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 with a history of multiple sclerosis diagnosed in September 1996. He has been on Betaseron since 1996 and has right leg spasticity requiring him to use a cane in his left hand.He does not have any significant side effects from the Betaseron. Blood studies 02/10/2012 are normal CBC and CMP except for a low platelet count of 63K, Hgb 12.1 and alk Phos of 253. This is being followed by [name] and [name]. He has never had lymphopenia or neutropenia. There is no change in his symptoms. He denies bowel or bladder incontinence, weakness, Lhermitte's sign, or double vision. He swims 3 times per week for 10 laps which is a little over a quarter of a mile, claims he is winded afterwards. He has not fallen, uses a cane in his left hand. MRI of the brain and cervical spine with and without contrast enhancement 03/26/2010 showed multiple subcortical, periventricular, and brainstem white matter hyperintensities without enhancing lesions present. There were T1 black holes and atrophy present. The cervical MRI showed a large central disc protrusion at C5-6 and spinal cord hyperintensities at C4 and brain stem representing remote demyelinating plaques without enhancing lesions present.  Ampyra was started November 2012 with improvement in gait. He was pleased with the addition of the drug and his response. Pt denies injection site problems however he says he is tired of shots. He has been on [employer] since 1996 and we have talked about whether changing to an oral agent should be considered. He had several falls with his right leg giving way on standing. He has right knee pain. He states his memory is worse. He denies numbness delusions or depression. 07/11/2012= (MMSE29/30. Clock drawing task4/4. Animal fluency test 17). His wife has noted more problems walking which the patient is hesitant to discuss. He does admit since calling me for a work in appointment , that he believes he is having more difficulty walking. He uses a walker at home and is noticeably slower. He uses the walker to get out of a bed that is low and out of a chair. He notices shortness of breath on swimming. His swallowing is okay except for occasional difficulty with liquids. He has right knee pain when walking. Betaseron seems to help his knee pain, walking, and his eyes after an injection. He is hesitant to go off of the medication for 3 weeks. He has increased spasms in his right foot and leg that are also moving to the left leg. He has swelling in his legs treated with furosemide.    REVIEW OF SYSTEMS:  Full 14 system review of systems performed and negative except: as per HPI.    ALLERGIES: No Known Allergies  HOME MEDICATIONS: Outpatient Medications Prior to Visit  Medication Sig Dispense Refill  . AMPYRA 10 MG TB12 TAKE ONE TABLET BY MOUTH TWICE DAILY (APPROXIMATELY 12 HOURS APART). MAY BE TAKEN WITH OR WITHOUT FOOD. DO NOT CUT OR CRUSH. STORE AT ROOM T 180 tablet 3  . Cholecalciferol (VITAMIN D3) 2000 UNITS TABS Take 1 capsule by mouth daily.    Marland Kitchen COPAXONE 40 MG/ML SOSY 40 mg.    . docusate sodium (COLACE) 100 MG capsule Take 100 mg by mouth daily as needed for mild constipation.    Marland Kitchen ELIQUIS 5 MG TABS  tablet TAKE 1 TABLET BY MOUTH TWICE DAILY. 180 tablet 0  . ferrous sulfate 325 (65 FE) MG tablet Take 1 tablet (325 mg total) by mouth 3 (three) times daily with meals. 90 tablet 1  . folic acid (FOLVITE) 1 MG tablet Take 1 mg by mouth daily.      . furosemide (LASIX) 40 MG tablet TAKE 1 TABLET BY MOUTH TWICE DAILY. MAY TAKE AN ADDITIONAL TABLETS INTHE P.M. AS NEEDED. 180 tablet 3  . losartan (COZAAR) 100 MG tablet TAKE 1 TABLET ONCE DAILY. 30 tablet 0  . Multiple Vitamin (MULTIVITAMIN WITH MINERALS) TABS tablet Take 1 tablet by mouth daily. Centrum for men.    Marland Kitchen QUEtiapine (SEROQUEL) 25 MG tablet Take 25 mg by mouth at bedtime as needed.     Marland Kitchen spironolactone (ALDACTONE) 25 MG tablet TAKE 1 TABLET EACH DAY. 30 tablet 2  . traMADol (ULTRAM) 50 MG tablet Take 1 tablet (50 mg total) by mouth every 6 (six) hours as needed. 30 tablet 0  . triamcinolone cream (KENALOG) 0.1 % Apply 1 application topically daily as needed (rash).      No facility-administered medications prior to visit.    PAST MEDICAL HISTORY: Past Medical History:  Diagnosis Date  . Anemia   . Aneurysm of aortic arch ([place]) 04/04/2012   Chronic aneurysmal dilatation of aortic arch with chronic type A aortic dissection, s/p replacement of ascending thoracic aorta  . Aortic dissection ([place]) 11/05/1998   S/P emergency repair of acute type A aortic dissection with resuspension of native aortic valve  . Aortic dissection, thoracic ([place])   . Aortic root aneurysm ([place])   . Atrial fibrillation ([place]) 01/30/2013   Atrial fibrillation   . Bilateral lower extremity edema   . BPH (benign prostatic hyperplasia)   . CHF (congestive heart failure) ([place]) 1946/08/01   2D Echo - EF 50-55%, mild-moderately dilated right ventricle, mild-moderate tricuspid valve regurgitation, moderately dilated right atrium  . Depression   . Dyslipidemia   . Exertional shortness of breath    "for awhile now" (09/11/2013)  . Hemorrhoids   . History of blood  transfusion   . Hypertension   . Mitral regurgitation 02/20/2013  . Multiple sclerosis ([place])   . Neuromuscular disorder ([place])   . Pleural effusion, right, large 01/30/2013  . Severe tricuspid valve regurgitation 01/30/2013   Severe tricuspid regurg by recent 2-D echo with a dilated tricuspid annulus, moderate pulmonary hypertension and biatrial enlargement   . Thrombocytopenia ([place])     PAST SURGICAL HISTORY: Past Surgical History:  Procedure Laterality Date  . HEMORRHOID SURGERY  2009   Internal, external hemorrhoidectomy, general anesthesia,prone position. [Other]  . Open reduction and internal fixation of right distal radius fracture using [employer] distal radius volar locking plate.  07/2005  Dr Ninfa Linden  . Repair of acute type A aortic dissection with resuspension of native aortic valve  10/15/1998   Dr Roxy Manns  . TEE WITHOUT CARDIOVERSION N/A 02/17/2013   Procedure: TRANSESOPHAGEAL ECHOCARDIOGRAM (TEE);  Surgeon: [name], MD;  Location: [facility] ENDOSCOPY;  Service: Cardiovascular;  Laterality: N/A;    FAMILY HISTORY: Family History  Problem Relation Age of Onset  . Thyroid cancer Father        smoked  . Heart attack Mother   . Lung cancer Brother        smoked  . Emphysema Brother        smoked    SOCIAL HISTORY: Social History   Socioeconomic History  . Marital status: Married    Spouse name: Jackelyn Poling  . Number of children: 2  . Years of education: Ph.D  . Highest education level: Not on file  Occupational History  . Occupation: Retired Professor    Fish farm manager: [employer]    Comment: Statistics  Tobacco Use  . Smoking status: Never Smoker  . Smokeless tobacco: Never Used  Substance and Sexual Activity  . Alcohol use: Yes    Comment: 09/11/2013 "might have a drink once/month"  . Drug use: No  . Sexual activity: Not Currently  Other Topics Concern  . Not on file  Social History Narrative   Patient lives at home with spouse.   Caffeine Use: eat a  lot of chocolate, sodas, coffee and tea occasionally   Social Determinants of Health   Financial Resource Strain:   . Difficulty of Paying Living Expenses: Not on file  Food Insecurity:   . Worried About Charity fundraiser in the Last Year: Not on file  . Ran Out of Food in the Last Year: Not on file  Transportation Needs:   . Lack of Transportation (Medical): Not on file  . Lack of Transportation (Non-Medical): Not on file  Physical Activity:   . Days of Exercise per Week: Not on file  . Minutes of Exercise per Session: Not on file  Stress:   . Feeling of Stress : Not on file  Social Connections:   . Frequency of Communication with Friends and Family: Not on file  . Frequency of Social Gatherings with Friends and Family: Not on file  . Attends Religious Services: Not on file  . Active Member of Clubs or Organizations: Not on file  . Attends Archivist Meetings: Not on file  . Marital Status: Not on file  Intimate Partner Violence:   . Fear of Current or Ex-Partner: Not on file  . Emotionally Abused: Not on file  . Physically Abused: Not on file  . Sexually Abused: Not on file     PHYSICAL EXAM  Vitals:   09/18/19 1458  BP: 125/63  Pulse: 70  Temp: 97.8 F (36.6 C)  Weight: 169 lb (76.7 kg)  Height: '5\' 8"'  (1.727 m)   Body mass index is 25.7 kg/m.  Generalized: Well developed, in no acute distress  Neck: Supple, no carotid bruits  Cardiac: IRREG RATE RHYTHM, SYS MURMUR  Neurological examination   MENTAL STATUS: awake, alert, language fluent, comprehension intact, naming intact   CRANIAL NERVE: pupils equal and reactive to light, visual fields full to confrontation, extraocular muscles: SACCADIC BREAKDOWN OF SMOOTH PURSUIT; END GAZE NYSTAGMUS; PAST POINTING ON SACCADES. Facial sensation and WITH DECR RIGHT EYE CLOSURE WEAKNESS AND RIGHT LOWER FACIAL STRENGTH. SYNKINESES ON RIGHT FACE. Uvula midline, shoulder shrug symmetric, tongue  midline.   MOTOR:  INCREASED TONE IN BLE > BUE (RIGHT > LEFT). BUE (DELTOID 4, TRICEP 4, BICEP 5, GRIP 4+), RIGHT LEG (HF 2, KE 3, KF 2, DF 1-2), LEFT LEG (HF 3, KE/KF 3, DF 3)  SENSORY: DECR IN RLE TO ALL MODALITIES.   COORDINATION: finger-nose-finger, fine finger movements normal   REFLEXES: BUE 3 (R>L), RLE 3, LLE 2.   GAIT/STATION: IN WHEELCHAIR   DIAGNOSTIC DATA (LABS, IMAGING, TESTING) - I reviewed patient records, labs, notes, testing and imaging myself where available.  Lab Results  Component Value Date   WBC 5.6 04/18/2019   HGB 12.6 (L) 04/18/2019   HCT 37.7 04/18/2019   MCV 85 04/18/2019   PLT 136 (L) 04/18/2019   CMP Latest Ref Rng & Units 11/07/2018 08/22/2018 07/06/2018  Glucose 65 - 99 mg/dL - 103(H) 96  BUN 8 - 27 mg/dL - 47(H) 36(H)  Creatinine 0.76 - 1.27 mg/dL - 1.49(H) 1.52(H)  Sodium 134 - 144 mmol/L - 141 141  Potassium 3.5 - 5.2 mmol/L - 4.7 4.1  Chloride 96 - 106 mmol/L - 101 106  CO2 20 - 29 mmol/L - 24 29  Calcium 8.6 - 10.2 mg/dL - 9.9 9.9  Total Protein 6.0 - 8.5 g/dL 7.6 7.1 -  Total Bilirubin 0.0 - 1.2 mg/dL 0.6 0.4 -  Alkaline Phos 39 - 117 IU/L 185(H) 178(H) -  AST 0 - 40 IU/L 30 25 -  ALT 0 - 44 IU/L 33 29 -   lymph#  Date Value Ref Range Status  02/10/2012 0.9 0.9 - 3.3 10e3/uL Final  08/10/2011 1.1 0.9 - 3.3 10e3/uL Final  03/12/2010 0.8 (L) 0.9 - 3.3 10e3/uL Final   Lymphocytes Absolute  Date Value Ref Range Status  04/18/2019 0.3 (L) 0.7 - 3.1 x10E3/uL Final  01/02/2019 0.4 (L) 0.7 - 3.1 x10E3/uL Final  12/06/2018 0.5 (L) 0.7 - 3.1 x10E3/uL Final    11/16/12 MRI brain - There are multiple periventricular and subcortical and pontine chronic demyelinating plaques. There are several T1 black holes. Mild diffuse atrophy. No acute plaques. In comparison to MRI from 03/26/10, there is no significant change.   08/02/13 MRI brain - multiple brainstem, periventricular, corpus callosal and subcortical white matter hyperintensities compatible with chronic lesions  of multiple sclerosis. No enhancing lesions are noted. The presence of T1 black holes and significant atrophy of corpus callosum and cortex indicates chronic disease.  11/16/12 MRI cervical spine - Multiple chronic demyelinating plaques at C2-3, C3-4 and C5-6. No acute plaques. At C5-6: Disc bulging with facet hypertrophy with moderate spinal stenosis, severe right and moderate left foraminal stenosis. At C3-4, C4-5: Disc bulging with mild spinal stenosis and biforaminal stenosis. In comparison to MRI from 03/26/10 there is no significant change.   01/20/13 JCV antibody - positive, index 3.37 (H)    ASSESSMENT AND PLAN  74 y.o. old male with Hypertension; Anemia; Depression; Multiple sclerosis; Thrombocytopenia; Dyslipidemia; Aortic dissection, thoracic; Aortic dissection (11/05/1998); Aneurysm of aortic arch (04/04/2012); Chest pain (11/18/2009); CHF (congestive heart failure), Atrial fibrillation (01/30/2013); Pleural effusion, right, large (01/30/2013); Severe tricuspid valve regurgitation (01/30/2013); Mitral regurgitation (02/20/2013); and Lower extremity edema (02/20/2013) here with multiple sclerosis since 1996. Initially on betaseron, now on tecfidera.  Dx:  1. MS (multiple sclerosis) ([place])   2. Thrombocytopenia ([place])   3. Abnormal CBC      PLAN:  MULTIPLE SCLEROSIS - continue copaxone - continue ampyra - start baclofen 60m three times a day  - continue swimming and  PT exercises - high fall risk and on anti-coagulation for atrial fibrillation; precautions reviewed with patient   Meds ordered this encounter  Medications  . baclofen (LIORESAL) 10 MG tablet    Sig: Take 0.5-1 tablets (5-10 mg total) by mouth 2 (two) times daily.    Dispense:  60 tablet    Refill:  6   Return in about 9 months (around 06/17/2020).     Penni Bombard, MD 11/20/4663, 9:93 PM Certified in Neurology, Neurophysiology and Neuroimaging  [facility] [address], [place],  [place] [address] [phone]

## 2019-09-18 NOTE — Telephone Encounter (Signed)
Pt left VM stating his weight/fluid were up and requested a call back at [phone].  Called pt VM full could not leave a VM.

## 2019-09-21 ENCOUNTER — Ambulatory Visit (INDEPENDENT_AMBULATORY_CARE_PROVIDER_SITE_OTHER): Payer: Medicare Other | Admitting: Psychology

## 2019-09-21 DIAGNOSIS — F4323 Adjustment disorder with mixed anxiety and depressed mood: Secondary | ICD-10-CM | POA: Diagnosis not present

## 2019-09-29 ENCOUNTER — Other Ambulatory Visit (HOSPITAL_COMMUNITY): Payer: Self-pay | Admitting: Internal Medicine

## 2019-10-01 ENCOUNTER — Other Ambulatory Visit (HOSPITAL_COMMUNITY): Payer: Self-pay | Admitting: Internal Medicine

## 2019-10-02 ENCOUNTER — Ambulatory Visit (INDEPENDENT_AMBULATORY_CARE_PROVIDER_SITE_OTHER): Payer: Medicare Other | Admitting: Psychology

## 2019-10-02 DIAGNOSIS — F4323 Adjustment disorder with mixed anxiety and depressed mood: Secondary | ICD-10-CM | POA: Diagnosis not present

## 2019-10-05 ENCOUNTER — Ambulatory Visit: Payer: Self-pay

## 2019-10-05 ENCOUNTER — Ambulatory Visit: Payer: Medicare Other | Attending: Internal Medicine

## 2019-10-05 DIAGNOSIS — Z23 Encounter for immunization: Secondary | ICD-10-CM | POA: Insufficient documentation

## 2019-10-05 NOTE — Progress Notes (Signed)
   Covid-19 Vaccination Clinic  Name:  [name]    MRN: [id number] DOB: 28-Aug-1946  10/05/2019  [name] was observed post Covid-19 immunization for 15 minutes without incidence. He was provided with Vaccine Information Sheet and instruction to access the V-Safe system.   [name] was instructed to call 911 with any severe reactions post vaccine: Marland Kitchen Difficulty breathing  . Swelling of your face and throat  . A fast heartbeat  . A bad rash all over your body  . Dizziness and weakness    Immunizations Administered    Name Date Dose VIS Date Route   Pfizer COVID-19 Vaccine 10/05/2019 11:24 AM 0.3 mL 08/25/2019 Intramuscular   Manufacturer: [place]   Lot: [id number]   [place]: [id number]

## 2019-10-13 ENCOUNTER — Other Ambulatory Visit: Payer: Self-pay | Admitting: Diagnostic Neuroimaging

## 2019-10-16 ENCOUNTER — Ambulatory Visit (INDEPENDENT_AMBULATORY_CARE_PROVIDER_SITE_OTHER): Payer: Medicare Other | Admitting: Psychology

## 2019-10-16 ENCOUNTER — Other Ambulatory Visit: Payer: Self-pay | Admitting: *Deleted

## 2019-10-16 DIAGNOSIS — F4323 Adjustment disorder with mixed anxiety and depressed mood: Secondary | ICD-10-CM | POA: Diagnosis not present

## 2019-10-16 MED ORDER — AMPYRA 10 MG PO TB12: ORAL_TABLET | ORAL | 3 refills | Status: DC

## 2019-10-20 ENCOUNTER — Other Ambulatory Visit: Payer: Self-pay

## 2019-10-20 ENCOUNTER — Encounter: Payer: Self-pay | Admitting: Family Medicine

## 2019-10-20 ENCOUNTER — Ambulatory Visit (INDEPENDENT_AMBULATORY_CARE_PROVIDER_SITE_OTHER): Payer: Medicare Other | Admitting: Family Medicine

## 2019-10-20 DIAGNOSIS — M25561 Pain in right knee: Secondary | ICD-10-CM | POA: Diagnosis not present

## 2019-10-20 NOTE — Progress Notes (Signed)
Office Visit Note    Patient: [name]           Date of Birth: 08-25-1946           MRN: [id number] Visit Date: 10/20/2019 Requested by: Sueanne Margarita, [address] [place],  [place] [address] PCP: Sueanne Margarita, DO  Subjective: Chief Complaint  Patient presents with  . Right Knee - Pain    Pain returned in the right knee over the last few days. Unsure of any specific incident/injury but has been getting in/out of bed a lot, and it is a high bed.    HPI: He is here with recurrent right knee pain.  Injection in December helped until the past week.  The pain is not as bad as before, but it is starting to bother him when getting in and out of a bed.  It hurts to walk with his walker.  Last time he was having a lot of spasms in his leg which have improved with baclofen.  His wife goes to [facility] physical therapy and he would like to know if that would help him as well.              ROS:   All other systems were reviewed and are negative.  Objective: Vital Signs: There were no vitals taken for this visit.  Physical Exam:  General:  Alert and oriented, in no acute distress. Pulm:  Breathing unlabored. Psy:  Normal mood, congruent affect. Skin: No warmth or erythema. Right knee: Ligaments feel stable.  He has 1+ effusion.  He is tender along the medial and lateral joint line.  Imaging: None today  Assessment & Plan: 1.  Recurrent right knee pain, probably due to underlying DJD. -Discussed options with him, he wants to try 1 more injection followed by physical therapy.  If symptoms persist we will order x-rays and possibly MRI scan.     Procedures: Right knee steroid injection: After sterile prep with Betadine, injected 5 cc 1% lidocaine without epinephrine and 40 mg methylprednisolone from superolateral approach.  A flash of clear yellow synovial fluid was obtained prior to injection.   PMFS History: Patient Active Problem List   Diagnosis Date Noted  .  Aortic root aneurysm ([place])   . H/O aortic dissection 02/13/2016  . Aneurysm of aortic arch ([place]) 12/31/2014  . Tricuspid regurgitation 10/08/2014  . RVF (right ventricular failure) ([place]) 10/08/2014  . Chronic diastolic congestive heart failure ([place]) 10/09/2013  . Sinus pause 09/16/2013  . Anemia due to blood loss, acute in setting of supratheraputic INR 09/11/13 09/12/2013  . 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 Acute renal insufficiency 09/12/2013  . Anasarca 09/12/2013  . Anemia 09/12/2013  . Bilateral leg edema 09/11/2013  . Acute right-sided congestive heart failure ([place]) 09/11/2013  . Unstable gait 09/11/2013  . Dissection of aorta, thoracic- surgery Feb 2000 02/20/2013  . Mitral regurgitation 02/20/2013  . Lower extremity edema 02/20/2013  . Volume overload 01/30/2013  . Severe tricuspid valve regurgitation 01/30/2013  . Pleural effusion, right, large 01/30/2013  . MS (multiple sclerosis) ([place]) 01/13/2013  . Thrombocytopenia ([place]) 07/26/2011  . Aortic dissection- chronic abd dissection  11/05/1998   Past Medical History:  Diagnosis Date  . Anemia   . Aneurysm of aortic arch ([place]) 04/04/2012   Chronic aneurysmal dilatation of aortic arch with chronic type A aortic dissection, s/p replacement of ascending thoracic aorta  . Aortic dissection ([place]) 11/05/1998   S/P emergency  repair of acute type A aortic dissection with resuspension of native aortic valve  . Aortic dissection, thoracic ([place])   . Aortic root aneurysm ([place])   . Atrial fibrillation ([place]) 01/30/2013   Atrial fibrillation   . Bilateral lower extremity edema   . BPH (benign prostatic hyperplasia)   . CHF (congestive heart failure) ([place]) 07-07-1946   2D Echo - EF 50-55%, mild-moderately dilated right ventricle, mild-moderate tricuspid valve regurgitation, moderately dilated right atrium  . Depression   . Dyslipidemia   . Exertional shortness of breath    "for awhile now" (09/11/2013)  .  Hemorrhoids   . History of blood transfusion   . Hypertension   . Mitral regurgitation 02/20/2013  . Multiple sclerosis ([place])   . Neuromuscular disorder ([place])   . Pleural effusion, right, large 01/30/2013  . Severe tricuspid valve regurgitation 01/30/2013   Severe tricuspid regurg by recent 2-D echo with a dilated tricuspid annulus, moderate pulmonary hypertension and biatrial enlargement   . Thrombocytopenia ([place])     Family History  Problem Relation Age of Onset  . Thyroid cancer Father        smoked  . Heart attack Mother   . Lung cancer Brother        smoked  . Emphysema Brother        smoked    Past Surgical History:  Procedure Laterality Date  . HEMORRHOID SURGERY  2009   Internal, external hemorrhoidectomy, general anesthesia,prone position. [Other]  . Open reduction and internal fixation of right distal radius fracture using [employer] distal radius volar locking plate.  07/2005   Dr Ninfa Linden  . Repair of acute type A aortic dissection with resuspension of native aortic valve  10/15/1998   Dr Roxy Manns  . TEE WITHOUT CARDIOVERSION N/A 02/17/2013   Procedure: TRANSESOPHAGEAL ECHOCARDIOGRAM (TEE);  Surgeon: [name], MD;  Location[facility] ENDOSCOPY;  Service: Cardiovascular;  Laterality: N/A;   Social History   Occupational History  . Occupation: Retired Professor    Fish farm manager: [employer]    Comment: Statistics  Tobacco Use  . Smoking status: Never Smoker  . Smokeless tobacco: Never Used  Substance and Sexual Activity  . Alcohol use: Yes    Comment: 09/11/2013 "might have a drink once/month"  . Drug use: No  . Sexual activity: Not Currently

## 2019-10-26 ENCOUNTER — Ambulatory Visit: Payer: Medicare Other | Attending: Internal Medicine

## 2019-10-26 DIAGNOSIS — Z23 Encounter for immunization: Secondary | ICD-10-CM | POA: Insufficient documentation

## 2019-10-26 NOTE — Progress Notes (Signed)
   Covid-19 Vaccination Clinic  Name:  [name]    MRN: [id number] DOB: [birth year]  10/26/2019  [name] was observed post Covid-19 immunization for 15 minutes without incidence. He was provided with Vaccine Information Sheet and instruction to access the V-Safe system.   [name] was instructed to call 911 with any severe reactions post vaccine: Marland Kitchen Difficulty breathing  . Swelling of your face and throat  . A fast heartbeat  . A bad rash all over your body  . Dizziness and weakness    Immunizations Administered    Name Date Dose VIS Date Route   Pfizer COVID-19 Vaccine 10/26/2019 11:35 AM 0.3 mL 08/25/2019 Intramuscular   Manufacturer: [place]   Lot: [id number]   [place]: [id number]

## 2019-10-29 ENCOUNTER — Other Ambulatory Visit (HOSPITAL_COMMUNITY): Payer: Self-pay | Admitting: Internal Medicine

## 2019-10-30 ENCOUNTER — Telehealth (HOSPITAL_COMMUNITY): Payer: Self-pay | Admitting: Cardiology

## 2019-10-30 ENCOUNTER — Ambulatory Visit (INDEPENDENT_AMBULATORY_CARE_PROVIDER_SITE_OTHER): Payer: Medicare Other | Admitting: Psychology

## 2019-10-30 DIAGNOSIS — F4323 Adjustment disorder with mixed anxiety and depressed mood: Secondary | ICD-10-CM

## 2019-10-30 NOTE — Telephone Encounter (Signed)
Please contact patient for office visit

## 2019-11-01 ENCOUNTER — Telehealth: Payer: Self-pay | Admitting: Radiology

## 2019-11-01 ENCOUNTER — Telehealth: Payer: Self-pay | Admitting: *Deleted

## 2019-11-01 DIAGNOSIS — R269 Unspecified abnormalities of gait and mobility: Secondary | ICD-10-CM

## 2019-11-01 DIAGNOSIS — G35 Multiple sclerosis: Secondary | ICD-10-CM

## 2019-11-01 DIAGNOSIS — M25561 Pain in right knee: Secondary | ICD-10-CM

## 2019-11-01 MED ORDER — DICLOFENAC POTASSIUM 50 MG PO TABS: 50.0000 mg | ORAL_TABLET | Freq: Two times a day (BID) | ORAL | 3 refills | Status: DC | PRN

## 2019-11-01 MED ORDER — DICLOFENAC SODIUM 1 % EX GEL: 4.0000 g | Freq: Four times a day (QID) | CUTANEOUS | 6 refills | Status: DC | PRN

## 2019-11-01 MED ORDER — DALFAMPRIDINE ER 10 MG PO TB12: ORAL_TABLET | ORAL | 3 refills | Status: DC

## 2019-11-01 NOTE — Telephone Encounter (Signed)
Please advise 

## 2019-11-01 NOTE — Telephone Encounter (Signed)
Voltaren Gel Rx sent.   Orders placed for X-Rays and MRI scan of knee to be done at [facility].

## 2019-11-01 NOTE — Telephone Encounter (Signed)
Received fax from [facility], need new Rx for dalfampridine ER 10 mg and ICD 10 code. Rx sent on 10/16/19 edited to include ICD 10- G35, R26.9.

## 2019-11-01 NOTE — Addendum Note (Signed)
Addended by: Hortencia Pilar on: 11/01/2019 04:54 PM   Modules accepted: Orders

## 2019-11-01 NOTE — Telephone Encounter (Signed)
I called and advised the patient of the plan. He has voltaren gel already. He went to PT today, and they recommended he ice the knee 15 minutes/hour for 3 hours. It did help. He said he feels the PT will help him, as they will try to correct the way he has been walking. Advised the patient [place] will be calling him to set up an appointment for the imaging tests.

## 2019-11-01 NOTE — Telephone Encounter (Signed)
Patient called and would like call back from Dr. Junius Roads or his assistant. He states that he went for a walk yesterday and his right knee is bothering him again. It did get better after the appointment on 10/19/2018.  He thought that maybe you would be able to recommend something that he could do at home?  Please call [phone]

## 2019-11-13 ENCOUNTER — Ambulatory Visit (INDEPENDENT_AMBULATORY_CARE_PROVIDER_SITE_OTHER): Payer: Medicare Other | Admitting: Psychology

## 2019-11-13 DIAGNOSIS — F4323 Adjustment disorder with mixed anxiety and depressed mood: Secondary | ICD-10-CM | POA: Diagnosis not present

## 2019-11-21 ENCOUNTER — Ambulatory Visit: Payer: Medicare Other | Admitting: Family Medicine

## 2019-11-27 ENCOUNTER — Ambulatory Visit (INDEPENDENT_AMBULATORY_CARE_PROVIDER_SITE_OTHER): Payer: Medicare Other | Admitting: Psychology

## 2019-11-27 DIAGNOSIS — F4323 Adjustment disorder with mixed anxiety and depressed mood: Secondary | ICD-10-CM

## 2019-11-29 ENCOUNTER — Other Ambulatory Visit (HOSPITAL_COMMUNITY): Payer: Self-pay | Admitting: Internal Medicine

## 2019-11-30 ENCOUNTER — Other Ambulatory Visit: Payer: Self-pay

## 2019-11-30 ENCOUNTER — Encounter: Payer: Self-pay | Admitting: Family Medicine

## 2019-11-30 ENCOUNTER — Ambulatory Visit
Admission: RE | Admit: 2019-11-30 | Discharge: 2019-11-30 | Disposition: A | Discharge status: Home / Self Care | Payer: Medicare Other | Source: Ambulatory Visit | Attending: Family Medicine | Admitting: Family Medicine

## 2019-11-30 DIAGNOSIS — M25561 Pain in right knee: Secondary | ICD-10-CM

## 2019-12-04 ENCOUNTER — Telehealth (HOSPITAL_COMMUNITY): Payer: Self-pay | Admitting: *Deleted

## 2019-12-04 NOTE — Telephone Encounter (Signed)
Pt left VM stating his weight was going up he did not think lasix is working as well as before. Called pt to schedule follow up as patient has not been seen in office since 2019. Pt did not answer/left vm.

## 2019-12-06 ENCOUNTER — Other Ambulatory Visit: Payer: Self-pay | Admitting: Family Medicine

## 2019-12-06 ENCOUNTER — Telehealth: Payer: Self-pay | Admitting: Family Medicine

## 2019-12-06 MED ORDER — DIAZEPAM 5 MG PO TABS: ORAL_TABLET | ORAL | 0 refills | Status: DC

## 2019-12-06 NOTE — Telephone Encounter (Signed)
Valium Rx sent

## 2019-12-06 NOTE — Telephone Encounter (Signed)
There's nothing in the system yet.

## 2019-12-06 NOTE — Telephone Encounter (Signed)
Patient returned my call and told me his weight is up 10lbs over several months. Pt denies shortness of breath, fatigue, chest pain, no swelling in feet/ankles. I asked patient if he had swelling in his abdomen he told me it was harder to button his pants but his stomach was not hard or distended.  Patient has been taking Lasix 80mg  bid (pt said his pcp was ok with this change). Per Adline Potter patient needs next available appt with APP clinic until then monitor symptoms if he develops any swelling, shortness of breath, or weight continues to increase he needs to call our office. Called patient back to advise no answer/left message requesting return call.

## 2019-12-06 NOTE — Telephone Encounter (Signed)
Looks like patient was in too much pain at MRI visit and was moving too much, may need to give him something to take and we can reschedule him

## 2019-12-06 NOTE — Telephone Encounter (Signed)
Patient called.   He is requesting the status of his MRI results.   Call back: [phone]

## 2019-12-07 ENCOUNTER — Telehealth: Payer: Self-pay | Admitting: Family Medicine

## 2019-12-07 NOTE — Telephone Encounter (Signed)
Patient called.   He is concerned about being prescribed Valium. He feels he should be getting a muscle relaxer that he is more familiar with.   Call back: [phone]

## 2019-12-07 NOTE — Telephone Encounter (Signed)
Patient aware this was sent in for him and to call facility and reschedule his appt for MRI

## 2019-12-07 NOTE — Telephone Encounter (Signed)
Pls advise.  

## 2019-12-08 MED ORDER — BACLOFEN 10 MG PO TABS: 10.0000 mg | ORAL_TABLET | Freq: Three times a day (TID) | ORAL | 3 refills | Status: DC | PRN

## 2019-12-08 NOTE — Telephone Encounter (Signed)
Valium is actually used as a muscle relaxant sometimes.  But if he'd prefer something else, that's fine.  Baclofen is on his med list, so I just called in a refill.  If he wants something different, let me know.

## 2019-12-08 NOTE — Addendum Note (Signed)
Addended by: Hortencia Pilar on: 12/08/2019 08:01 AM   Modules accepted: Orders

## 2019-12-08 NOTE — Telephone Encounter (Signed)
Tried calling patient. No answer. LMVM advising per Dr Junius Roads.

## 2019-12-11 ENCOUNTER — Ambulatory Visit (INDEPENDENT_AMBULATORY_CARE_PROVIDER_SITE_OTHER): Payer: Medicare Other | Admitting: Psychology

## 2019-12-11 DIAGNOSIS — F4323 Adjustment disorder with mixed anxiety and depressed mood: Secondary | ICD-10-CM | POA: Diagnosis not present

## 2019-12-12 ENCOUNTER — Other Ambulatory Visit: Payer: Self-pay | Admitting: Family Medicine

## 2019-12-12 ENCOUNTER — Other Ambulatory Visit (HOSPITAL_COMMUNITY): Payer: Self-pay | Admitting: Internal Medicine

## 2019-12-12 DIAGNOSIS — M25561 Pain in right knee: Secondary | ICD-10-CM

## 2019-12-14 ENCOUNTER — Other Ambulatory Visit (HOSPITAL_COMMUNITY): Payer: Self-pay | Admitting: *Deleted

## 2019-12-14 ENCOUNTER — Other Ambulatory Visit: Payer: Self-pay | Admitting: Diagnostic Neuroimaging

## 2019-12-15 ENCOUNTER — Other Ambulatory Visit (HOSPITAL_COMMUNITY): Payer: Self-pay | Admitting: Internal Medicine

## 2019-12-18 ENCOUNTER — Other Ambulatory Visit (HOSPITAL_COMMUNITY): Payer: Self-pay | Admitting: *Deleted

## 2019-12-18 ENCOUNTER — Ambulatory Visit (INDEPENDENT_AMBULATORY_CARE_PROVIDER_SITE_OTHER): Payer: Medicare Other | Admitting: Diagnostic Neuroimaging

## 2019-12-18 ENCOUNTER — Other Ambulatory Visit: Payer: Self-pay

## 2019-12-18 ENCOUNTER — Encounter: Payer: Self-pay | Admitting: Diagnostic Neuroimaging

## 2019-12-18 VITALS — BP 112/62 | HR 74 | Temp 97.4°F | Ht 68.0 in | Wt 168.0 lb

## 2019-12-18 DIAGNOSIS — G35 Multiple sclerosis: Secondary | ICD-10-CM

## 2019-12-18 MED ORDER — LOSARTAN POTASSIUM 100 MG PO TABS: 100.0000 mg | ORAL_TABLET | Freq: Every day | ORAL | 0 refills | Status: DC

## 2019-12-18 NOTE — Progress Notes (Signed)
PATIENT: [name] DOB: 1946/04/30   REASON FOR VISIT: follow up for MS HISTORY FROM: patient  Chief Complaint  Patient presents with  . Multiple Sclerosis    rm 7, FU  "working with ortho and PT, using stationary bike which has helped"     HISTORY OF PRESENT ILLNESS:  UPDATE (12/18/19, VRP): Since last visit, doing well. Symptoms are stable. Still working on right knee issues. Has stationary bike at home now. No alleviating or aggravating factors. Tolerating meds. Playing chess a lot nowadays.   UPDATE (09/18/19, VRP): Since last visit, doing well until 2 weeks ago (mild cold sxs x 1 day; then generalized weakness). Right knee locked up; saw ortho and it was drained and injected --> now better.   UPDATE (04/18/19, VRP): Since last visit, doing WELL. Symptoms are stable / improved slightly. No alleviating or aggravating factors. Tolerating copaxone.  Now back to swimming exercises.  UPDATE (10/05/18, VRP): Since last visit, doing about the same. Symptoms are stable. Patient wants to consider staying on tecfidera, in spite of low lymphocyte count and risk of PML. No alleviating or aggravating factors.  UPDATE (08/22/18, VRP): Since last visit, symptoms are slightly progressed (gait issues). No alleviating or aggravating factors. Tolerating tecfidera and ampyra. Staying active with swimming.  UPDATE (08/18/17, VRP): Since last visit, tolerating meds. No alleviating or aggravating factors. Ampyra helping somewhat. Having to lean on walker more.   UPDATE 08/18/16: Since last visit, tolerating meds. No new issues. Some slight subjective progression of symptoms. Had a fall recently with subsequent right forearm bruising.   UPDATE 02/11/16: Since last visit, tolerating tecfidera. Some slow progression. Asking about new MS medications.  UPDATE 08/13/15: Tolerating meds. More urinary issues. Using walker mainly.   UPDATE 04/16/15: Since last visit, patient is stable. Getting home visits with  palliative care.   UPDATE 02/05/15: Discussed palliative care consult and options.   UPDATE 08/02/14: Since last visit, feels stable. Tolerating tecfidera and ampyra. No new events. Uses cane and walker.  Swimming more. Wife notes memory issues slightly worse.   UPDATE 01/31/14: Since last visit, continues on tecfidera. No progression of neuro sxs or MS sxs. Asking about driving again. Has not driven in last 3-4 months. Ongoing medical mgmt of his cardiac issues, and he is not a surgical candidate.  UPDATE 10/25/13 (LL): Patient comes in for revisit, since last visit he had worsening right-sided HF and was hospitalized.  He was seen by [name] for surgical consultation to consider tricuspid valve repair and possible Maze procedure.  He was referred to the advanced heart failure clinic who has been adjusting his lasix to manage his lower extremity edema and shortness of breath.  He is to be seen by [name] again for follow up in another month, and says [name] felt that surgery was needed.  He comes in today to discuss with [name] how his MS would be affected by having the surgery.  His wife feels like he is having more difficulty walking since last visit, but he has not had any falls.  He is tolerating Tecfidera and Ampyra well.  UPDATE 07/19/13 (VP): Patient continues to have progressive deterioration in cognitive ability, memory, mood lability, gait stability. Patient is on tecfidera now, and tolerating without side effects. Patient feels when he came off of Betaseron he had some accelerated progression of MS that this has slowed down since starting tecfidera. Unclear whether patient's current level of functioning is on the same  trajectory as the decline that was noted while patient was on Betaseron, or whether there has been some acceleration of deterioration. Other factors include some marital discord and comorbid depression. Other contributing factors include his cardiac issues including  atrial fibrillation, on Coumadin, as well as severe tricuspid valve regurgitation leading to peripheral edema. Unfortunately patient is not felt to be a good surgical candidate.   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 The patient is accompanied by his wife. Pt. States he is doing worse since his last visit in Feb. He is walking slower and having more difficulty standing from a seated position. 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 [name] had talked to him about changing MS medications and he thinks he wants to. Having more memory problems. Continuing on betaseron right now.   PRIOR HPI (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 with a history of multiple sclerosis diagnosed in September 1996. He has been on Betaseron since 1996 and has right leg spasticity requiring him to use a cane in his left hand.He does not have any significant side effects from the Betaseron. Blood studies 02/10/2012 are normal CBC and CMP except for a low platelet count of 63K, Hgb 12.1 and alk Phos of 253. This is being followed by [name] and [name]. He has never had lymphopenia or neutropenia. There is no change in his symptoms. He denies bowel or bladder incontinence, weakness, Lhermitte's sign, or double vision. He swims 3 times per week for 10 laps which is a little over a quarter of a mile, claims he is winded afterwards. He has not fallen, uses a cane in his left hand. MRI of the brain and cervical spine with and without contrast enhancement 03/26/2010 showed multiple subcortical, periventricular, and brainstem white matter hyperintensities without enhancing lesions present. There were T1 black holes and atrophy present. The cervical MRI showed a large central disc protrusion at C5-6 and spinal cord hyperintensities at C4 and brain stem  representing remote demyelinating plaques without enhancing lesions present. Ampyra was started November 2012 with improvement in gait. He was pleased with the addition of the drug and his response. Pt denies injection site problems however he says he is tired of shots. He has been on [employer] since 1996 and we have talked about whether changing to an oral agent should be considered. He had several falls with his right leg giving way on standing. He has right knee pain. He states his memory is worse. He denies numbness delusions or depression. 07/11/2012= (MMSE29/30. Clock drawing task4/4. Animal fluency test 17). His wife has noted more problems walking which the patient is hesitant to discuss. He does admit since calling me for a work in appointment , that he believes he is having more difficulty walking. He uses a walker at home and is noticeably slower. He uses the walker to get out of a bed that is low and out of a chair. He notices shortness of breath on swimming. His swallowing is okay except for occasional difficulty with liquids. He has right knee pain when walking. Betaseron seems to help his knee pain, walking, and his eyes after an injection. He is hesitant to go off of the medication for 3 weeks. He has increased spasms in his right foot and leg that are also moving to the left leg.  He has swelling in his legs treated with furosemide.    REVIEW OF SYSTEMS: Full 14 system review of systems performed and negative except: as per HPI.    ALLERGIES: No Known Allergies  HOME MEDICATIONS: Outpatient Medications Prior to Visit  Medication Sig Dispense Refill  . apixaban (ELIQUIS) 5 MG TABS tablet Take 1 tablet (5 mg total) by mouth 2 (two) times daily. Needs appt for further refills 180 tablet 0  . baclofen (LIORESAL) 10 MG tablet Take 1-2 tablets (10-20 mg total) by mouth 3 (three) times daily as needed for muscle spasms. 90 tablet 3  . Cholecalciferol (VITAMIN D3) 2000 UNITS TABS Take 1  capsule by mouth daily.    Marland Kitchen COPAXONE 40 MG/ML SOSY INJECT ONE SYRINGE SUBCUTANEOUSLY 3 TIMES A WEEK AT LEAST 48 HOURS APART. ALLOW TO WARM TO ROOM TEMP FOR 20 MINUTES. REFRIGERATE. 36 mL 2  . dalfampridine (AMPYRA) 10 MG TB12 TAKE ONE TABLET BY MOUTH TWICE DAILY (APPROXIMATELY 12 HOURS APART). MAY BE TAKEN WITH OR WITHOUT FOOD. DO NOT CUT OR CRUSH. STORE AT ROOM T 180 tablet 3  . diazepam (VALIUM) 5 MG tablet 1-2 PO 1 hour before MRI, repeat prn 5 tablet 0  . diclofenac Sodium (VOLTAREN) 1 % GEL Apply 4 g topically 4 (four) times daily as needed. 500 g 6  . docusate sodium (COLACE) 100 MG capsule Take 100 mg by mouth daily as needed for mild constipation.    . ferrous sulfate 325 (65 FE) MG tablet Take 1 tablet (325 mg total) by mouth 3 (three) times daily with meals. 90 tablet 1  . folic acid (FOLVITE) 1 MG tablet Take 1 mg by mouth daily.      . furosemide (LASIX) 40 MG tablet Take 1 tablet (40 mg total) by mouth 2 (two) times daily. Last refill without office visit please call [phone] to schedule 60 tablet 0  . losartan (COZAAR) 100 MG tablet Take 1 tablet (100 mg total) by mouth daily. Please call the office at [phone] to schedule an appointment for further refills. 15 tablet 0  . Multiple Vitamin (MULTIVITAMIN WITH MINERALS) TABS tablet Take 1 tablet by mouth daily. Centrum for men.    Marland Kitchen QUEtiapine (SEROQUEL) 25 MG tablet Take 25 mg by mouth at bedtime as needed.     Marland Kitchen spironolactone (ALDACTONE) 25 MG tablet TAKE 1 TABLET EACH DAY. 30 tablet 2  . traMADol (ULTRAM) 50 MG tablet Take 1 tablet (50 mg total) by mouth every 6 (six) hours as needed. 30 tablet 0  . triamcinolone cream (KENALOG) 0.1 % Apply 1 application topically daily as needed (rash).      No facility-administered medications prior to visit.    PAST MEDICAL HISTORY: Past Medical History:  Diagnosis Date  . Anemia   . Aneurysm of aortic arch ([place]) 04/04/2012   Chronic aneurysmal dilatation of aortic arch with chronic  type A aortic dissection, s/p replacement of ascending thoracic aorta  . Aortic dissection ([place]) 11/05/1998   S/P emergency repair of acute type A aortic dissection with resuspension of native aortic valve  . Aortic dissection, thoracic ([place])   . Aortic root aneurysm ([place])   . Atrial fibrillation ([place]) 01/30/2013   Atrial fibrillation   . Bilateral lower extremity edema   . BPH (benign prostatic hyperplasia)   . CHF (congestive heart failure) ([place]) 01-14-1946   2D Echo - EF 50-55%, mild-moderately dilated right ventricle, mild-moderate tricuspid valve regurgitation, moderately dilated right atrium  . Depression   .  Dyslipidemia   . Exertional shortness of breath    "for awhile now" (09/11/2013)  . Hemorrhoids   . History of blood transfusion   . Hypertension   . Mitral regurgitation 02/20/2013  . Multiple sclerosis ([place])   . Neuromuscular disorder ([place])   . Pleural effusion, right, large 01/30/2013  . Severe tricuspid valve regurgitation 01/30/2013   Severe tricuspid regurg by recent 2-D echo with a dilated tricuspid annulus, moderate pulmonary hypertension and biatrial enlargement   . Thrombocytopenia ([place])     PAST SURGICAL HISTORY: Past Surgical History:  Procedure Laterality Date  . HEMORRHOID SURGERY  2009   Internal, external hemorrhoidectomy, general anesthesia,prone position. [Other]  . Open reduction and internal fixation of right distal radius fracture using [employer] distal radius volar locking plate.  07/2005   Dr Ninfa Linden  . Repair of acute type A aortic dissection with resuspension of native aortic valve  10/15/1998   Dr Roxy Manns  . TEE WITHOUT CARDIOVERSION N/A 02/17/2013   Procedure: TRANSESOPHAGEAL ECHOCARDIOGRAM (TEE);  Surgeon: [name], MD;  Location: [facility];  Service: Cardiovascular;  Laterality: N/A;    FAMILY HISTORY: Family History  Problem Relation Age of Onset  . Thyroid cancer Father        smoked  . Heart attack Mother   . Lung cancer  Brother        smoked  . Emphysema Brother        smoked    SOCIAL HISTORY: Social History   Socioeconomic History  . Marital status: Married    Spouse name: Jackelyn Poling  . Number of children: 2  . Years of education: Ph.D  . Highest education level: Not on file  Occupational History  . Occupation: Retired Professor    Fish farm manager: [employer]    Comment: Statistics  Tobacco Use  . Smoking status: Never Smoker  . Smokeless tobacco: Never Used  Substance and Sexual Activity  . Alcohol use: Yes    Comment: 09/11/2013 "might have a drink once/month"  . Drug use: No  . Sexual activity: Not Currently  Other Topics Concern  . Not on file  Social History Narrative   Patient lives at home with spouse.   Caffeine Use: eat a lot of chocolate, sodas, coffee and tea occasionally   Social Determinants of Health   Financial Resource Strain:   . Difficulty of Paying Living Expenses:   Food Insecurity:   . Worried About Charity fundraiser in the Last Year:   . Arboriculturist in the Last Year:   Transportation Needs:   . Film/video editor (Medical):   Marland Kitchen Lack of Transportation (Non-Medical):   Physical Activity:   . Days of Exercise per Week:   . Minutes of Exercise per Session:   Stress:   . Feeling of Stress :   Social Connections:   . Frequency of Communication with Friends and Family:   . Frequency of Social Gatherings with Friends and Family:   . Attends Religious Services:   . Active Member of Clubs or Organizations:   . Attends Archivist Meetings:   Marland Kitchen Marital Status:   Intimate Partner Violence:   . Fear of Current or Ex-Partner:   . Emotionally Abused:   Marland Kitchen Physically Abused:   . Sexually Abused:      PHYSICAL EXAM  Vitals:   12/18/19 1411  BP: 112/62  Pulse: 74  Temp: (!) 97.4 F (36.3 C)  Weight: 168 lb (76.2  kg)  Height: '5\' 8"'  (1.727 m)   Body mass index is 25.54 kg/m.  Generalized: Well developed, in no acute distress  Neck:  Supple, no carotid bruits  Cardiac: IRREG RATE RHYTHM, SYS MURMUR  Neurological examination   MENTAL STATUS: awake, alert, language fluent, comprehension intact, naming intact   CRANIAL NERVE: pupils equal and reactive to light, visual fields full to confrontation, extraocular muscles: SACCADIC BREAKDOWN OF SMOOTH PURSUIT; END GAZE NYSTAGMUS; PAST POINTING ON SACCADES. Facial sensation and WITH DECR RIGHT EYE CLOSURE WEAKNESS AND RIGHT LOWER FACIAL STRENGTH. SYNKINESES ON RIGHT FACE. Uvula midline, shoulder shrug symmetric, tongue midline.   MOTOR: INCREASED TONE IN BLE > BUE (RIGHT > LEFT). BUE (DELTOID 4, TRICEP 4, BICEP 5, GRIP 4+), RIGHT LEG (HF 2, KE 3, KF 2, DF 1-2), LEFT LEG (HF 3, KE/KF 3, DF 3)  SENSORY: DECR IN RLE TO ALL MODALITIES.   COORDINATION: finger-nose-finger, fine finger movements normal   REFLEXES: BUE 3 (R>L), RLE 3, LLE 2.   GAIT/STATION: using rollator   DIAGNOSTIC DATA (LABS, IMAGING, TESTING) - I reviewed patient records, labs, notes, testing and imaging myself where available.  Lab Results  Component Value Date   WBC 5.6 04/18/2019   HGB 12.6 (L) 04/18/2019   HCT 37.7 04/18/2019   MCV 85 04/18/2019   PLT 136 (L) 04/18/2019   CMP Latest Ref Rng & Units 11/07/2018 08/22/2018 07/06/2018  Glucose 65 - 99 mg/dL - 103(H) 96  BUN 8 - 27 mg/dL - 47(H) 36(H)  Creatinine 0.76 - 1.27 mg/dL - 1.49(H) 1.52(H)  Sodium 134 - 144 mmol/L - 141 141  Potassium 3.5 - 5.2 mmol/L - 4.7 4.1  Chloride 96 - 106 mmol/L - 101 106  CO2 20 - 29 mmol/L - 24 29  Calcium 8.6 - 10.2 mg/dL - 9.9 9.9  Total Protein 6.0 - 8.5 g/dL 7.6 7.1 -  Total Bilirubin 0.0 - 1.2 mg/dL 0.6 0.4 -  Alkaline Phos 39 - 117 IU/L 185(H) 178(H) -  AST 0 - 40 IU/L 30 25 -  ALT 0 - 44 IU/L 33 29 -   lymph#  Date Value Ref Range Status  02/10/2012 0.9 0.9 - 3.3 10e3/uL Final  08/10/2011 1.1 0.9 - 3.3 10e3/uL Final  03/12/2010 0.8 (L) 0.9 - 3.3 10e3/uL Final   Lymphocytes Absolute  Date Value Ref  Range Status  04/18/2019 0.3 (L) 0.7 - 3.1 x10E3/uL Final  01/02/2019 0.4 (L) 0.7 - 3.1 x10E3/uL Final  12/06/2018 0.5 (L) 0.7 - 3.1 x10E3/uL Final    11/16/12 MRI brain - There are multiple periventricular and subcortical and pontine chronic demyelinating plaques. There are several T1 black holes. Mild diffuse atrophy. No acute plaques. In comparison to MRI from 03/26/10, there is no significant change.   08/02/13 MRI brain - multiple brainstem, periventricular, corpus callosal and subcortical white matter hyperintensities compatible with chronic lesions of multiple sclerosis. No enhancing lesions are noted. The presence of T1 black holes and significant atrophy of corpus callosum and cortex indicates chronic disease.  11/16/12 MRI cervical spine - Multiple chronic demyelinating plaques at C2-3, C3-4 and C5-6. No acute plaques. At C5-6: Disc bulging with facet hypertrophy with moderate spinal stenosis, severe right and moderate left foraminal stenosis. At C3-4, C4-5: Disc bulging with mild spinal stenosis and biforaminal stenosis. In comparison to MRI from 03/26/10 there is no significant change.   01/20/13 JCV antibody - positive, index 3.37 (H)    ASSESSMENT AND PLAN  74 y.o. old male with  Hypertension; Anemia; Depression; Multiple sclerosis; Thrombocytopenia; Dyslipidemia; Aortic dissection, thoracic; Aortic dissection (11/05/1998); Aneurysm of aortic arch (04/04/2012); Chest pain (11/18/2009); CHF (congestive heart failure), Atrial fibrillation (01/30/2013); Pleural effusion, right, large (01/30/2013); Severe tricuspid valve regurgitation (01/30/2013); Mitral regurgitation (02/20/2013); and Lower extremity edema (02/20/2013) here with multiple sclerosis since 1996. Initially on betaseron, now on tecfidera.  Dx:  1. MS (multiple sclerosis) ([place])      PLAN:  MULTIPLE SCLEROSIS - continue copaxone - continue ampyra - continue baclofen 60m three times a day  - continue swimming and PT exercises - high  fall risk and on anti-coagulation for atrial fibrillation; precautions reviewed with patient  Return in about 9 months (around 09/18/2020).     VPenni Bombard MD 40/09/5613 23:79PM Certified in Neurology, Neurophysiology and Neuroimaging  [facility] [address] [place] [phone]

## 2019-12-19 ENCOUNTER — Other Ambulatory Visit: Payer: Self-pay

## 2019-12-19 ENCOUNTER — Ambulatory Visit (HOSPITAL_COMMUNITY)
Admission: EM | Admit: 2019-12-19 | Discharge: 2019-12-19 | Disposition: A | Discharge status: Home / Self Care | Payer: Medicare Other

## 2019-12-19 NOTE — Telephone Encounter (Signed)
Pt called back stating his weight has gone up again and he does not want an in office visit he only wants a virtual visit with Dr.Bensimhon. virtual visit scheduled.

## 2019-12-19 NOTE — ED Triage Notes (Signed)
Patient came to [place] from his PCP, where he was x-rayed following a fall. PCP reports his left ring finger was dislocated. Pulled Dr. Lanny Cramp in to evaluate patient. Patient referred to [facility].

## 2019-12-20 ENCOUNTER — Other Ambulatory Visit: Payer: Self-pay

## 2019-12-20 ENCOUNTER — Encounter (HOSPITAL_COMMUNITY): Payer: Self-pay | Admitting: *Deleted

## 2019-12-20 ENCOUNTER — Ambulatory Visit (HOSPITAL_COMMUNITY)
Admission: RE | Admit: 2019-12-20 | Discharge: 2019-12-20 | Disposition: A | Discharge status: Home / Self Care | Payer: Medicare Other | Source: Ambulatory Visit | Attending: Internal Medicine | Admitting: Internal Medicine

## 2019-12-20 DIAGNOSIS — I7101 Dissection of thoracic aorta: Secondary | ICD-10-CM

## 2019-12-20 DIAGNOSIS — G35 Multiple sclerosis: Secondary | ICD-10-CM

## 2019-12-20 DIAGNOSIS — I5032 Chronic diastolic (congestive) heart failure: Secondary | ICD-10-CM

## 2019-12-20 DIAGNOSIS — I7121 Aneurysm of the ascending aorta, without rupture: Secondary | ICD-10-CM

## 2019-12-20 DIAGNOSIS — I071 Rheumatic tricuspid insufficiency: Secondary | ICD-10-CM

## 2019-12-20 DIAGNOSIS — I50810 Right heart failure, unspecified: Secondary | ICD-10-CM | POA: Diagnosis not present

## 2019-12-20 DIAGNOSIS — I482 Chronic atrial fibrillation, unspecified: Secondary | ICD-10-CM | POA: Diagnosis not present

## 2019-12-20 DIAGNOSIS — I110 Hypertensive heart disease with heart failure: Secondary | ICD-10-CM | POA: Diagnosis not present

## 2019-12-20 DIAGNOSIS — I719 Aortic aneurysm of unspecified site, without rupture: Secondary | ICD-10-CM

## 2019-12-20 DIAGNOSIS — Z7901 Long term (current) use of anticoagulants: Secondary | ICD-10-CM

## 2019-12-20 MED ORDER — POTASSIUM CHLORIDE CRYS ER 20 MEQ PO TBCR: 40.0000 meq | EXTENDED_RELEASE_TABLET | ORAL | 3 refills | Status: DC

## 2019-12-20 MED ORDER — METOLAZONE 2.5 MG PO TABS: 2.5000 mg | ORAL_TABLET | ORAL | 3 refills | Status: DC

## 2019-12-20 NOTE — Patient Instructions (Signed)
Take Metolazone 2.5 mg TODAY ONLY  Take Potassium 40 meq (2 tabs) TODAY ONLY  Prescriptions for both medications have been sent into [facility] for you, we gave you 5 tabs of each so you will have some extra on hand, DO NOT TAKE IT unless directed to do so by our office  Please call us or send a mychart message a day or 2 to let us know if the Metolazone helped or not  Lab work needed soon  Your physician recommends that you schedule a follow-up appointment in: 2-3 weeks with echocardiogram  OUR OFFICE WILL CALL YOU TO [place]  If you have any questions or concerns before your next appointment please send [place] a message through [place] or call our office at [phone].

## 2019-12-20 NOTE — Addendum Note (Signed)
Encounter addended by: Scarlette Calico, RN on: 12/20/2019 2:25 PM  Actions taken: Pharmacy for encounter modified, Order list changed, Diagnosis association updated, Clinical Note Signed

## 2019-12-20 NOTE — Progress Notes (Signed)
Bensimhon, Shaune Pascal, MD  Scarlette Calico, RN  - give metolazone 2.5 x 1 with 40K and assess response.  - check BMET and BNP  - see in 2-3 weeks in office with repeat echo    Orders placed, message sent to schedulers to arrange appts, AVS sent via mychart along with message to let us know if Metolazone worked or not

## 2019-12-20 NOTE — Progress Notes (Signed)
Heart Failure TeleHealth Note  Due to national recommendations of social distancing due to [place] 19, Audio/video telehealth visit is felt to be most appropriate for this patient at this time.  See MyChart message from today for patient consent regarding telehealth for [facility]. The patient was identified personally using two identifiers.   Date:  12/20/2019   ID:  [name], DOB 29-May-1946, MRN [id number]  Location: Home  Provider location: [facility] Type of Visit: Established patient  PCP:  Sueanne Margarita, DO  Cardiologist:  No primary care provider on file. Primary HF: Alfhild Partch  Chief Complaint: Heart Failure follow-up   History of Present Illness:  [name] is a 74 year old retired Engineer, production professor with a history of advanced multiple sclerosis, Type A aortic dissection 2000, right heart failure with severe TR and chronic AF referred by [name] for further evaluation for possible TV repair.   He presented acutely on October 15, 1998 with acute type A aortic dissection. He underwent emergency surgical repair with hemi-arch replacement of the ascending thoracic aorta and resuspension of the native aortic valve. 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 Because of the patient's underlying multiple sclerosis with severe generalized weakness patient was not felt to be candidate for surgical intervention. He was referred to [facility] for a second opinion and again not felt to be candidate for elective surgery  Admitted in December 2014 with right heart failure including severe bilateral lower extremity edema and generalized anasarca. Diuresed well and was started on Coumadin for long-term anticoagulation because of chronic persistent atrial fibrillation.  Echo in 3/15 actually  showed that tricuspid regurgitation looked more moderate; EF 55%, mild AI, aortic root at sinuses of valsalva 4.8 cm, moderate Timothy, mildly dilated RV moderate TR, PA systolic pressure 46 mmHg.    He presents via Engineer, civil (consulting) for a telehealth visit today due to increasing weight gain. We last saw him in 10/19. Says he has been gaining weight and fluid. Also says SBP has dropped some. Previously ~130. Now 115-120. Has had steady weight gain with swelling in his stomach. Weight up about 10 pounds. No LE edema. Has not been swimming as much due to [place] and also recently had MS flare. Now riding exercise bike. No significant exertional dyspnea. About 1 month ago increased lasix from 80 daily to 80 bid. At first weight came down but now back up and stabilized.    Echo 4/18  LVEF 60-65% RV dilated with normal function. Moderate to severe TR. AoRoot dilated 6.0 cm with mild AI  Echo 1/20 LVEF 55-60% moderate Timothy. RV severely dilated. Function normal severe TR. AoRoot dilated 6.0 cm with mild AI   [name] denies symptoms worrisome for COVID 19.   Past Medical History:  Diagnosis Date  . Anemia   . Aneurysm of aortic arch ([place]) 04/04/2012   Chronic aneurysmal dilatation of aortic arch with chronic type A aortic dissection, s/p replacement of ascending thoracic aorta  . Aortic dissection ([place]) 11/05/1998   S/P emergency repair of acute type A aortic dissection with resuspension of native aortic valve  . Aortic dissection, thoracic ([place])   . Aortic root aneurysm ([place])   . Atrial fibrillation ([place]) 01/30/2013   Atrial fibrillation   . Bilateral lower extremity edema   . BPH (benign prostatic  hyperplasia)   . CHF (congestive heart failure) ([place]) 74   2D Echo - EF 50-55%, mild-moderately dilated right ventricle, mild-moderate tricuspid valve regurgitation, moderately dilated right atrium  . Depression   . Dyslipidemia   . Exertional shortness of breath    "for awhile now"  (09/11/2013)  . Hemorrhoids   . History of blood transfusion   . Hypertension   . Mitral regurgitation 02/20/2013  . Multiple sclerosis ([place])   . Neuromuscular disorder ([place])   . Pleural effusion, right, large 01/30/2013  . Severe tricuspid valve regurgitation 01/30/2013   Severe tricuspid regurg by recent 2-D echo with a dilated tricuspid annulus, moderate pulmonary hypertension and biatrial enlargement   . Thrombocytopenia ([place])    Past Surgical History:  Procedure Laterality Date  . HEMORRHOID SURGERY  2009   Internal, external hemorrhoidectomy, general anesthesia,prone position. [Other]  . Open reduction and internal fixation of right distal radius fracture using [employer] distal radius volar locking plate.  07/2005   Dr Ninfa Linden  . Repair of acute type A aortic dissection with resuspension of native aortic valve  10/15/1998   Dr Roxy Manns  . TEE WITHOUT CARDIOVERSION N/A 02/17/2013   Procedure: TRANSESOPHAGEAL ECHOCARDIOGRAM (TEE);  Surgeon: [name], MD;  Location: [facility] ENDOSCOPY;  Service: Cardiovascular;  Laterality: N/A;     Current Outpatient Medications  Medication Sig Dispense Refill  . apixaban (ELIQUIS) 5 MG TABS tablet Take 1 tablet (5 mg total) by mouth 2 (two) times daily. Needs appt for further refills 180 tablet 0  . baclofen (LIORESAL) 10 MG tablet Take 1-2 tablets (10-20 mg total) by mouth 3 (three) times daily as needed for muscle spasms. 90 tablet 3  . Cholecalciferol (VITAMIN D3) 2000 UNITS TABS Take 1 capsule by mouth daily.    Marland Kitchen COPAXONE 40 MG/ML SOSY INJECT ONE SYRINGE SUBCUTANEOUSLY 3 TIMES A WEEK AT LEAST 48 HOURS APART. ALLOW TO WARM TO ROOM TEMP FOR 20 MINUTES. REFRIGERATE. 36 mL 2  . dalfampridine (AMPYRA) 10 MG TB12 TAKE ONE TABLET BY MOUTH TWICE DAILY (APPROXIMATELY 12 HOURS APART). MAY BE TAKEN WITH OR WITHOUT FOOD. DO NOT CUT OR CRUSH. STORE AT ROOM T 180 tablet 3  . diazepam (VALIUM) 5 MG tablet 1-2 PO 1 hour before MRI, repeat prn 5 tablet 0  .  diclofenac Sodium (VOLTAREN) 1 % GEL Apply 4 g topically 4 (four) times daily as needed. 500 g 6  . docusate sodium (COLACE) 100 MG capsule Take 100 mg by mouth daily as needed for mild constipation.    . ferrous sulfate 325 (65 FE) MG tablet Take 1 tablet (325 mg total) by mouth 3 (three) times daily with meals. 90 tablet 1  . folic acid (FOLVITE) 1 MG tablet Take 1 mg by mouth daily.      . furosemide (LASIX) 40 MG tablet Take 1 tablet (40 mg total) by mouth 2 (two) times daily. Last refill without office visit please call [phone] to schedule 60 tablet 0  . losartan (COZAAR) 100 MG tablet Take 1 tablet (100 mg total) by mouth daily. Please call the office at [phone] to schedule an appointment for further refills. 15 tablet 0  . Multiple Vitamin (MULTIVITAMIN WITH MINERALS) TABS tablet Take 1 tablet by mouth daily. Centrum for men.    Marland Kitchen QUEtiapine (SEROQUEL) 25 MG tablet Take 25 mg by mouth at bedtime as needed.     Marland Kitchen spironolactone (ALDACTONE) 25 MG tablet TAKE 1 TABLET EACH DAY. 30 tablet 2  .  traMADol (ULTRAM) 50 MG tablet Take 1 tablet (50 mg total) by mouth every 6 (six) hours as needed. 30 tablet 0  . triamcinolone cream (KENALOG) 0.1 % Apply 1 application topically daily as needed (rash).      No current facility-administered medications for this encounter.    Allergies:   Patient has no known allergies.   Social History:  The patient  reports that he has never smoked. He has never used smokeless tobacco. He reports current alcohol use. He reports that he does not use drugs.   Family History:  The patient's family history includes Emphysema in his brother; Heart attack in his mother; Lung cancer in his brother; Thyroid cancer in his father.   ROS:  Please see the history of present illness.   All other systems are personally reviewed and negative.   Exam:  (Video/Tele Health Call; Exam is subjective and or/visual.) General:  Speaks in full sentences. No resp  difficulty. Lungs: Normal respiratory effort with conversation.  Abdomen: Distended per patient report Extremities: Pt denies edema. Neuro: Alert & oriented x 3.   Recent Labs: 04/18/2019: Hemoglobin 12.6; Platelets 136  Personally reviewed   Wt Readings from Last 3 Encounters:  12/18/19 76.2 kg (168 lb)  09/18/19 76.7 kg (169 lb)  04/18/19 72.1 kg (159 lb)      ASSESSMENT AND PLAN:  1. Right heart failure with severe TR  - Last echo 4/18 LV/RV function stable. TR remains moderate to severe - Echo 1/20 LVEF 55-60% moderate Timothy. RV severely dilated. Function normal severe TR. AoRoot dilated 6.0 cm with mild AI - Reports 10 pound weight gain in his abdomen without LE edema (which he had before). He had initial response to increased lasix but not responding any more. I am not sure that weight gain is fluid (with cardiorenal syndrome) vs adipose tissue - will give metolazone 2.5 x 1 with 40K and assess response.  - also check labs - see in 2 weeks with repeat echo -Intolerant carvedilol - He is not a candidate for a tricuspid valve operation.  2. Aortic dissection:   - s/p hemi-arch replacement of the ascending thoracic aorta and resuspension of the native aortic valve 2000  - now with chronic dissection and enlarging aneurysm involving sinus of Valsalva and aortic arch  - CTA 4/18 was stable. Continue to follow with TCTS -Intolerant bb.  -echo 1/20 AoRoot stable at 6.0 cm 3. HTN  -  SBP running  110-120s at home   4. Chronic AF:  - Rate controlled. Continue Eliquis - Denies bleeding 5. Severe multiple sclerosis    --continue to follow with Neuro   COVID screen The patient does not have any symptoms that suggest any further testing/ screening at this time.  Social distancing reinforced today.  Recommended follow-up:  As above  Relevant cardiac medications were reviewed at length with the patient today.   The patient does not have concerns regarding their medications at this  time.   The following changes were made today:  As above  Today, I have spent 19 minutes with the patient with telehealth technology discussing the above issues .    Signed, Glori Bickers, MD  12/20/2019 1:00 PM  Advanced Heart Failure [place] [address] [phone] (office) [phone] (fax)

## 2019-12-25 ENCOUNTER — Ambulatory Visit (INDEPENDENT_AMBULATORY_CARE_PROVIDER_SITE_OTHER): Payer: Medicare Other | Admitting: Psychology

## 2019-12-25 DIAGNOSIS — F4323 Adjustment disorder with mixed anxiety and depressed mood: Secondary | ICD-10-CM

## 2019-12-27 ENCOUNTER — Ambulatory Visit: Payer: Medicare Other | Admitting: Family Medicine

## 2019-12-28 ENCOUNTER — Telehealth: Payer: Self-pay | Admitting: *Deleted

## 2019-12-28 ENCOUNTER — Other Ambulatory Visit (HOSPITAL_COMMUNITY): Payer: Self-pay | Admitting: Internal Medicine

## 2019-12-28 NOTE — Telephone Encounter (Signed)
Received fax form, PA for Dalfampridine (Ampyra) from [place]. Form completed, signed, faxed to [place], f [phone].

## 2019-12-29 ENCOUNTER — Other Ambulatory Visit (HOSPITAL_COMMUNITY): Payer: Self-pay | Admitting: Internal Medicine

## 2020-01-03 NOTE — Telephone Encounter (Signed)
Haven't received decision on PA for dalfampridine ER. Called [facility], spoke with Destiny who stated the drug was approved until 12/27/20.

## 2020-01-05 ENCOUNTER — Ambulatory Visit (HOSPITAL_COMMUNITY)
Admission: RE | Admit: 2020-01-05 | Discharge: 2020-01-05 | Disposition: A | Discharge status: Home / Self Care | Payer: Medicare Other | Source: Ambulatory Visit | Attending: Internal Medicine | Admitting: Internal Medicine

## 2020-01-05 ENCOUNTER — Encounter (HOSPITAL_COMMUNITY): Payer: Self-pay | Admitting: Internal Medicine

## 2020-01-05 ENCOUNTER — Ambulatory Visit
Admission: RE | Admit: 2020-01-05 | Discharge: 2020-01-05 | Disposition: A | Discharge status: Home / Self Care | Payer: Medicare Other | Source: Ambulatory Visit | Attending: Family Medicine | Admitting: Family Medicine

## 2020-01-05 ENCOUNTER — Other Ambulatory Visit: Payer: Self-pay

## 2020-01-05 ENCOUNTER — Ambulatory Visit (HOSPITAL_BASED_OUTPATIENT_CLINIC_OR_DEPARTMENT_OTHER)
Admission: RE | Admit: 2020-01-05 | Discharge: 2020-01-05 | Disposition: A | Discharge status: Home / Self Care | Payer: Medicare Other | Source: Ambulatory Visit | Attending: Internal Medicine | Admitting: Internal Medicine

## 2020-01-05 VITALS — BP 124/58 | HR 71 | Wt 174.0 lb

## 2020-01-05 DIAGNOSIS — I071 Rheumatic tricuspid insufficiency: Secondary | ICD-10-CM

## 2020-01-05 DIAGNOSIS — I50810 Right heart failure, unspecified: Secondary | ICD-10-CM | POA: Insufficient documentation

## 2020-01-05 DIAGNOSIS — I110 Hypertensive heart disease with heart failure: Secondary | ICD-10-CM | POA: Diagnosis not present

## 2020-01-05 DIAGNOSIS — I5032 Chronic diastolic (congestive) heart failure: Secondary | ICD-10-CM | POA: Diagnosis present

## 2020-01-05 DIAGNOSIS — I719 Aortic aneurysm of unspecified site, without rupture: Secondary | ICD-10-CM | POA: Diagnosis not present

## 2020-01-05 DIAGNOSIS — M25561 Pain in right knee: Secondary | ICD-10-CM

## 2020-01-05 DIAGNOSIS — I083 Combined rheumatic disorders of mitral, aortic and tricuspid valves: Secondary | ICD-10-CM | POA: Insufficient documentation

## 2020-01-05 DIAGNOSIS — Z8249 Family history of ischemic heart disease and other diseases of the circulatory system: Secondary | ICD-10-CM | POA: Diagnosis not present

## 2020-01-05 DIAGNOSIS — Z79899 Other long term (current) drug therapy: Secondary | ICD-10-CM | POA: Insufficient documentation

## 2020-01-05 DIAGNOSIS — I482 Chronic atrial fibrillation, unspecified: Secondary | ICD-10-CM

## 2020-01-05 DIAGNOSIS — I7121 Aneurysm of the ascending aorta, without rupture: Secondary | ICD-10-CM

## 2020-01-05 DIAGNOSIS — Z825 Family history of asthma and other chronic lower respiratory diseases: Secondary | ICD-10-CM | POA: Insufficient documentation

## 2020-01-05 DIAGNOSIS — G35 Multiple sclerosis: Secondary | ICD-10-CM | POA: Insufficient documentation

## 2020-01-05 DIAGNOSIS — Z808 Family history of malignant neoplasm of other organs or systems: Secondary | ICD-10-CM | POA: Diagnosis not present

## 2020-01-05 DIAGNOSIS — Z7901 Long term (current) use of anticoagulants: Secondary | ICD-10-CM | POA: Diagnosis not present

## 2020-01-05 DIAGNOSIS — Z801 Family history of malignant neoplasm of trachea, bronchus and lung: Secondary | ICD-10-CM | POA: Insufficient documentation

## 2020-01-05 LAB — BASIC METABOLIC PANEL
Anion gap: 10 (ref 5–15)
BUN: 89 mg/dL — ABNORMAL HIGH (ref 8–23)
CO2: 26 mmol/L (ref 22–32)
Calcium: 10 mg/dL (ref 8.9–10.3)
Chloride: 103 mmol/L (ref 98–111)
Creatinine, Ser: 2.08 mg/dL — ABNORMAL HIGH (ref 0.61–1.24)
GFR calc Af Amer: 36 mL/min — ABNORMAL LOW (ref >60)
GFR calc non Af Amer: 31 mL/min — ABNORMAL LOW (ref >60)
Glucose, Bld: 104 mg/dL — ABNORMAL HIGH (ref 70–99)
Potassium: 4.5 mmol/L (ref 3.5–5.1)
Sodium: 139 mmol/L (ref 135–145)

## 2020-01-05 LAB — BRAIN NATRIURETIC PEPTIDE: B Natriuretic Peptide: 153.8 pg/mL — ABNORMAL HIGH (ref 0.0–100.0)

## 2020-01-05 NOTE — Patient Instructions (Signed)
No medication changes today!  Labs today We will only contact you if something comes back abnormal or we need to make some changes. Otherwise no news is good news!  Your physician wants you to follow-up in: 1 year. You will receive a reminder letter in the mail two months in advance. If you don't receive a letter, please call our office to schedule the follow-up appointment.   Please call office at [phone] option 2 if you have any questions or concerns.   At the [facility], you and your health needs are our priority. As part of our continuing mission to provide you with exceptional heart care, we have created designated Provider Care Teams. These Care Teams include your primary Cardiologist (physician) and Advanced Practice Providers (APPs- Physician Assistants and Nurse Practitioners) who all work together to provide you with the care you need, when you need it.   You may see any of the following providers on your designated Care Team at your next follow up: Marland Kitchen Dr Glori Bickers . Dr Loralie Champagne . Darrick Grinder, NP . Lyda Jester, PA . Audry Riles, PharmD   Please be sure to bring in all your medications bottles to every appointment.

## 2020-01-05 NOTE — Progress Notes (Signed)
  Echocardiogram 2D Echocardiogram has been performed.  [name] A [name] 01/05/2020, 2:58 PM

## 2020-01-05 NOTE — Progress Notes (Signed)
Advanced Heart Failure Clinic Note   Date:  01/05/2020   ID:  Alson, Mcpheeters 30-Sep-1945, MRN [id number]  Location: Home  Provider location: [facility] Type of Visit: Established patient  PCP:  Sueanne Margarita, DO  Cardiologist:  No primary care provider on file. 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 for further evaluation for possible TV repair.   He presented acutely on October 15, 1998 with acute type A aortic dissection. He underwent emergency surgical repair with hemi-arch replacement of the ascending thoracic aorta and resuspension of the native aortic valve. 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 Because of the patient's underlying multiple sclerosis with severe generalized weakness patient was not felt to be candidate for surgical intervention. He was referred to [facility] for a second opinion and again not felt to be candidate for elective surgery  Admitted in December 2014 with right heart failure including severe bilateral lower extremity edema and generalized anasarca. Diuresed well and was started on Coumadin for long-term anticoagulation because of chronic persistent atrial fibrillation.  Echo in 3/15 actually showed that tricuspid regurgitation looked more moderate; EF 55%, mild AI, aortic root at sinuses of valsalva 4.8 cm, moderate MR, mildly dilated RV moderate TR, PA systolic pressure 46 mmHg.   We had a telehealth visit a few weeks ago due to increasing weight. He felt like he was gaining fluid but also felt BP  was down. About 1 month ago increased lasix from 80 daily to 80 bid. At first weight came down but now back up and stabilized. I felt he was gaining weight.  Here for f/u. Says he feels fine. Now riding the stationary bike 25mins/day. He has been cutting calories. Denies edema, orthopnea or PND.    Echo today 01/05/20 EF 55-60% RV mildly dilated with normal function. AoRoot 5.8 cm mild AI  Mild to moderate TR  Echo 4/18  LVEF 60-65% RV dilated with normal function. Moderate to severe TR. AoRoot dilated 6.0 cm with mild AI  Echo 1/20 LVEF 55-60% moderate MR. RV severely dilated. Function normal severe TR. AoRoot dilated 6.0 cm with mild AI   Past Medical History:  Diagnosis Date  . Anemia   . Aneurysm of aortic arch ([place]) 04/04/2012   Chronic aneurysmal dilatation of aortic arch with chronic type A aortic dissection, s/p replacement of ascending thoracic aorta  . Aortic dissection ([place]) 11/05/1998   S/P emergency repair of acute type A aortic dissection with resuspension of native aortic valve  . Aortic dissection, thoracic ([place])   . Aortic root aneurysm ([place])   . Atrial fibrillation ([place]) 01/30/2013   Atrial fibrillation   . Bilateral lower extremity edema   . BPH (benign prostatic hyperplasia)   . CHF (congestive heart failure) ([place]) August 08, 1946   2D Echo - EF 50-55%, mild-moderately dilated right ventricle, mild-moderate tricuspid valve regurgitation, moderately dilated right atrium  . Depression   . Dyslipidemia   . Exertional shortness of breath    "for awhile now" (09/11/2013)  . Hemorrhoids   . History of blood transfusion   .  Hypertension   . Mitral regurgitation 02/20/2013  . Multiple sclerosis ([place])   . Neuromuscular disorder ([place])   . Pleural effusion, right, large 01/30/2013  . Severe tricuspid valve regurgitation 01/30/2013   Severe tricuspid regurg by recent 2-D echo with a dilated tricuspid annulus, moderate pulmonary hypertension and biatrial enlargement   .  Thrombocytopenia ([place])    Past Surgical History:  Procedure Laterality Date  . HEMORRHOID SURGERY  2009   Internal, external hemorrhoidectomy, general anesthesia,prone position. [Other]  . Open reduction and internal fixation of right distal radius fracture using [employer] distal radius volar locking plate.  07/2005   Dr Ninfa Linden  . Repair of acute type A aortic dissection with resuspension of native aortic valve  10/15/1998   Dr Roxy Manns  . TEE WITHOUT CARDIOVERSION N/A 02/17/2013   Procedure: TRANSESOPHAGEAL ECHOCARDIOGRAM (TEE);  Surgeon: [name], MD;  Location: [facility] ENDOSCOPY;  Service: Cardiovascular;  Laterality: N/A;     Current Outpatient Medications  Medication Sig Dispense Refill  . apixaban (ELIQUIS) 5 MG TABS tablet Take 1 tablet (5 mg total) by mouth 2 (two) times daily. Needs appt for further refills 180 tablet 0  . baclofen (LIORESAL) 10 MG tablet Take 1-2 tablets (10-20 mg total) by mouth 3 (three) times daily as needed for muscle spasms. 90 tablet 3  . Cholecalciferol (VITAMIN D3) 2000 UNITS TABS Take 1 capsule by mouth daily.    Marland Kitchen COPAXONE 40 MG/ML SOSY INJECT ONE SYRINGE SUBCUTANEOUSLY 3 TIMES A WEEK AT LEAST 48 HOURS APART. ALLOW TO WARM TO ROOM TEMP FOR 20 MINUTES. REFRIGERATE. 36 mL 2  . dalfampridine (AMPYRA) 10 MG TB12 TAKE ONE TABLET BY MOUTH TWICE DAILY (APPROXIMATELY 12 HOURS APART). MAY BE TAKEN WITH OR WITHOUT FOOD. DO NOT CUT OR CRUSH. STORE AT ROOM T 180 tablet 3  . diazepam (VALIUM) 5 MG tablet 1-2 PO 1 hour before MRI, repeat prn 5 tablet 0  . diclofenac Sodium (VOLTAREN) 1 % GEL Apply 4 g topically 4 (four) times daily as needed. 500 g 6  . docusate sodium (COLACE) 100 MG capsule Take 100 mg by mouth daily as needed for mild constipation.    . ferrous sulfate 325 (65 FE) MG tablet Take 1 tablet (325 mg total) by mouth 3 (three) times daily with meals. 90 tablet 1  . folic acid (FOLVITE) 1 MG tablet Take 1 mg by mouth daily.      . furosemide (LASIX)  40 MG tablet Take 80 mg by mouth 2 (two) times daily.    Marland Kitchen losartan (COZAAR) 100 MG tablet TAKE 1 TABLET ONCE DAILY. 15 tablet 0  . Multiple Vitamin (MULTIVITAMIN WITH MINERALS) TABS tablet Take 1 tablet by mouth daily. Centrum for men.    Marland Kitchen QUEtiapine (SEROQUEL) 25 MG tablet Take 25 mg by mouth at bedtime as needed.     Marland Kitchen spironolactone (ALDACTONE) 25 MG tablet TAKE 1 TABLET EACH DAY. 30 tablet 2  . traMADol (ULTRAM) 50 MG tablet Take 1 tablet (50 mg total) by mouth every 6 (six) hours as needed. 30 tablet 0  . triamcinolone cream (KENALOG) 0.1 % Apply 1 application topically daily as needed (rash).     . metolazone (ZAROXOLYN) 2.5 MG tablet Take 1 tablet (2.5 mg total) by mouth as directed. By HF clinic (Patient not taking: Reported on 01/05/2020) 5 tablet 3  . potassium chloride SA (KLOR-CON) 20 MEQ tablet Take 2 tablets (40 mEq total) by mouth as directed. ONLY WHEN YOU TAKE METOLAZONE (Patient not  taking: Reported on 01/05/2020) 10 tablet 3   No current facility-administered medications for this encounter.    Allergies:   Patient has no known allergies.   Social History:  The patient  reports that he has never smoked. He has never used smokeless tobacco. He reports current alcohol use. He reports that he does not use drugs.   Family History:  The patient's family history includes Emphysema in his brother; Heart attack in his mother; Lung cancer in his brother; Thyroid cancer in his father.   ROS:  Please see the history of present illness.   All other systems are personally reviewed and negative.   Vitals:   01/05/20 1508  BP: (!) 124/58  Pulse: 71  SpO2: 100%  Weight: 78.9 kg (174 lb)   Wt Readings from Last 3 Encounters:  01/05/20 78.9 kg (174 lb)  12/18/19 76.2 kg (168 lb)  09/18/19 76.7 kg (169 lb)   Exam:   General:  Well appearing. No resp difficulty HEENT: normal Neck: supple. no JVD. Carotids 2+ bilat; no bruits. No lymphadenopathy or thryomegaly appreciated. Cor: PMI  nondisplaced. Irregular rate & rhythm. 2/6 TR Lungs: clear Abdomen: soft, nontender, nondistended. No hepatosplenomegaly. No bruits or masses. Good bowel sounds. Extremities: no cyanosis, clubbing, rash, edema brace on right ankle Neuro: alert & orientedx3, cranial nerves grossly intact. moves all 4 extremities w/o difficulty. Affect pleasant   Recent Labs: 04/18/2019: Hemoglobin 12.6; Platelets 136  Personally reviewed   Wt Readings from Last 3 Encounters:  01/05/20 78.9 kg (174 lb)  12/18/19 76.2 kg (168 lb)  09/18/19 76.7 kg (169 lb)      ASSESSMENT AND PLAN:  1. Right heart failure with severe TR  - Last echo 4/18 LV/RV function stable. TR remains moderate to severe - Echo 1/20 LVEF 55-60% moderate MR. RV severely dilated. Function normal severe TR. AoRoot dilated 6.0 cm with mild AI - Echo today (01/04/20) EF 55-60% RV mildly dilated with mildly reduced function. AoRoot 5.8 cm mild AI  Mild to moderate TR - NYHA II (despite MS limitations). Volume status looks low. Suspect weight gain due to excess calories.  - Drop lasix back to 80 daily - Intolerant carvedilol - He is not a candidate for a tricuspid valve operation due to MS (TR looks better on echo today)  2. Aortic dissection:   - s/p hemi-arch replacement of the ascending thoracic aorta and resuspension of the native aortic valve 2000  - now with chronic dissection and aneurysm involving sinus of Valsalva and aortic arch  - CTA 4/18 was stable. Continue to follow with TCTS -Intolerant bb.  -echo 1/20 AoRoot stable at 6.0 cm - echo today AoRoot stable at 5.8cm. not candidate for surgery 3. HTN  -  Blood pressure well controlled. Continue current regimen. 4. Chronic AF:  - Rate controlled. Continue Eliquis - Denies bleeding 5. Severe multiple sclerosis - continue to follow with Neuro    Signed, Glori Bickers, MD  01/05/2020 3:27 PM  Advanced Heart Failure [place] [address] [facility] and  [place] [address] [phone] (office) [phone] (fax)

## 2020-01-08 ENCOUNTER — Ambulatory Visit (INDEPENDENT_AMBULATORY_CARE_PROVIDER_SITE_OTHER): Payer: Medicare Other | Admitting: Psychology

## 2020-01-08 ENCOUNTER — Telehealth: Payer: Self-pay | Admitting: Family Medicine

## 2020-01-08 DIAGNOSIS — F4323 Adjustment disorder with mixed anxiety and depressed mood: Secondary | ICD-10-CM

## 2020-01-08 NOTE — Telephone Encounter (Signed)
MRI scan shows some degenerative changes inside the meniscus cartilage but no meniscus tears.  No indication for surgery based on the MRI results.  The calf muscle and back of the knee has a strain pattern.  This could certainly be a source of pain.

## 2020-01-09 ENCOUNTER — Encounter (HOSPITAL_COMMUNITY): Payer: Self-pay

## 2020-01-09 ENCOUNTER — Telehealth (HOSPITAL_COMMUNITY): Payer: Self-pay

## 2020-01-09 MED ORDER — FUROSEMIDE 40 MG PO TABS: 80.0000 mg | ORAL_TABLET | Freq: Every day | ORAL | 4 refills | Status: DC

## 2020-01-09 NOTE — Telephone Encounter (Signed)
-----   Message from Jolaine Artist, MD sent at 01/08/2020  7:55 PM EDT ----- He is dry. Hold lasix for 2 days then cut back to once a day.

## 2020-01-09 NOTE — Telephone Encounter (Signed)
Pt aware of results. Medication changes reviewed. Pt advised if he started to gain weight, edema, or shortness of breath to contact office.  Pt verbalized understanding.

## 2020-01-11 ENCOUNTER — Telehealth: Payer: Self-pay | Admitting: *Deleted

## 2020-01-11 NOTE — Telephone Encounter (Signed)
Received fax from [facility]: Copaxone 40 mg PA. questions answered, signed by MD and faxed to [place].

## 2020-01-15 NOTE — Telephone Encounter (Signed)
Received fax from [place]: Copaxone approved 01/11/20 - 01/10/21

## 2020-01-16 ENCOUNTER — Other Ambulatory Visit (HOSPITAL_COMMUNITY): Payer: Self-pay | Admitting: Internal Medicine

## 2020-01-16 ENCOUNTER — Encounter: Payer: Self-pay | Admitting: Family Medicine

## 2020-01-22 ENCOUNTER — Ambulatory Visit (INDEPENDENT_AMBULATORY_CARE_PROVIDER_SITE_OTHER): Payer: Medicare Other | Admitting: Psychology

## 2020-01-22 DIAGNOSIS — F4323 Adjustment disorder with mixed anxiety and depressed mood: Secondary | ICD-10-CM | POA: Diagnosis not present

## 2020-01-26 ENCOUNTER — Other Ambulatory Visit (HOSPITAL_COMMUNITY): Payer: Self-pay | Admitting: Internal Medicine

## 2020-02-05 ENCOUNTER — Ambulatory Visit (INDEPENDENT_AMBULATORY_CARE_PROVIDER_SITE_OTHER): Payer: Medicare Other | Admitting: Psychology

## 2020-02-05 DIAGNOSIS — F4323 Adjustment disorder with mixed anxiety and depressed mood: Secondary | ICD-10-CM

## 2020-02-19 ENCOUNTER — Ambulatory Visit (INDEPENDENT_AMBULATORY_CARE_PROVIDER_SITE_OTHER): Payer: Medicare Other | Admitting: Psychology

## 2020-02-19 DIAGNOSIS — F4323 Adjustment disorder with mixed anxiety and depressed mood: Secondary | ICD-10-CM

## 2020-02-25 ENCOUNTER — Other Ambulatory Visit (HOSPITAL_COMMUNITY): Payer: Self-pay | Admitting: Internal Medicine

## 2020-03-01 ENCOUNTER — Other Ambulatory Visit: Payer: Self-pay | Admitting: *Deleted

## 2020-03-01 ENCOUNTER — Emergency Department (HOSPITAL_COMMUNITY): Payer: Medicare Other

## 2020-03-01 ENCOUNTER — Encounter (HOSPITAL_COMMUNITY): Payer: Self-pay

## 2020-03-01 ENCOUNTER — Other Ambulatory Visit: Payer: Self-pay

## 2020-03-01 ENCOUNTER — Emergency Department (HOSPITAL_COMMUNITY)
Admission: EM | Admit: 2020-03-01 | Discharge: 2020-03-01 | Disposition: A | Discharge status: Home / Self Care | Payer: Medicare Other | Attending: Emergency Medicine | Admitting: Emergency Medicine

## 2020-03-01 DIAGNOSIS — I5032 Chronic diastolic (congestive) heart failure: Secondary | ICD-10-CM | POA: Insufficient documentation

## 2020-03-01 DIAGNOSIS — R079 Chest pain, unspecified: Secondary | ICD-10-CM

## 2020-03-01 DIAGNOSIS — Z79899 Other long term (current) drug therapy: Secondary | ICD-10-CM | POA: Diagnosis not present

## 2020-03-01 DIAGNOSIS — I110 Hypertensive heart disease with heart failure: Secondary | ICD-10-CM | POA: Diagnosis not present

## 2020-03-01 DIAGNOSIS — R0789 Other chest pain: Secondary | ICD-10-CM | POA: Insufficient documentation

## 2020-03-01 DIAGNOSIS — R0782 Intercostal pain: Secondary | ICD-10-CM

## 2020-03-01 DIAGNOSIS — R69 Illness, unspecified: Secondary | ICD-10-CM

## 2020-03-01 LAB — CBC
HCT: 37 % — ABNORMAL LOW (ref 39.0–52.0)
Hemoglobin: 11.6 g/dL — ABNORMAL LOW (ref 13.0–17.0)
MCH: 28.3 pg (ref 26.0–34.0)
MCHC: 31.4 g/dL (ref 30.0–36.0)
MCV: 90.2 fL (ref 80.0–100.0)
Platelets: 123 10*3/uL — ABNORMAL LOW (ref 150–400)
RBC: 4.1 MIL/uL — ABNORMAL LOW (ref 4.22–5.81)
RDW: 13.4 % (ref 11.5–15.5)
WBC: 6.7 10*3/uL (ref 4.0–10.5)
nRBC: 0 % (ref 0.0–0.2)

## 2020-03-01 LAB — HEPATIC FUNCTION PANEL
ALT: 27 U/L (ref 0–44)
AST: 22 U/L (ref 15–41)
Albumin: 3.8 g/dL (ref 3.5–5.0)
Alkaline Phosphatase: 138 U/L — ABNORMAL HIGH (ref 38–126)
Bilirubin, Direct: <0.1 mg/dL (ref 0.0–0.2)
Total Bilirubin: 0.6 mg/dL (ref 0.3–1.2)
Total Protein: 7 g/dL (ref 6.5–8.1)

## 2020-03-01 LAB — BASIC METABOLIC PANEL
Anion gap: 10 (ref 5–15)
BUN: 78 mg/dL — ABNORMAL HIGH (ref 8–23)
CO2: 22 mmol/L (ref 22–32)
Calcium: 9.3 mg/dL (ref 8.9–10.3)
Chloride: 106 mmol/L (ref 98–111)
Creatinine, Ser: 2.43 mg/dL — ABNORMAL HIGH (ref 0.61–1.24)
GFR calc Af Amer: 30 mL/min — ABNORMAL LOW (ref >60)
GFR calc non Af Amer: 26 mL/min — ABNORMAL LOW (ref >60)
Glucose, Bld: 104 mg/dL — ABNORMAL HIGH (ref 70–99)
Potassium: 4.8 mmol/L (ref 3.5–5.1)
Sodium: 138 mmol/L (ref 135–145)

## 2020-03-01 LAB — TROPONIN I (HIGH SENSITIVITY)
Troponin I (High Sensitivity): 10 ng/L (ref <18)
Troponin I (High Sensitivity): 9 ng/L (ref <18)

## 2020-03-01 LAB — LIPASE, BLOOD: Lipase: 47 U/L (ref 11–51)

## 2020-03-01 MED ORDER — ASPIRIN 81 MG PO CHEW: 324.0000 mg | CHEWABLE_TABLET | Freq: Once | ORAL | Status: DC

## 2020-03-01 NOTE — ED Notes (Signed)
Remains pain free, wife at bedside.

## 2020-03-01 NOTE — ED Provider Notes (Signed)
[facility] EMERGENCY DEPARTMENT Provider Note   CSN: [id number] Arrival date & time: 03/01/20  8469     History No chief complaint on file.   [name] is a 74 y.o. male.  HPI Patient has complex medical history including advanced MS, chronic atrial fibrillation and history of aortic dissection with repair.  He has had normal baseline function for the past several weeks.  This morning, while at rest in the bed, patient experienced a sudden and severe pain that went from his chest down to the mid abdomen area.  Pain was a deep aching and sharp quality.  He did feel nauseated and became diaphoretic in association with this pain.  No associated shortness of breath.  No syncope.  No radiation to the arm or the legs.  Within about 10 to 15 minutes, the pain spontaneously resolved.  Patient reports he feels back to baseline.  He has not been having recent problems with vomiting or diarrhea.  No recent problems with persistent abdominal pain.  He does have persistent problems with volume overload and lower extremity swelling.  Patient takes Lasix for this and sees Dr. Maryland Pink for congestive heart failure management.    Past Medical History:  Diagnosis Date  . Anemia   . Aneurysm of aortic arch ([place]) 04/04/2012   Chronic aneurysmal dilatation of aortic arch with chronic type A aortic dissection, s/p replacement of ascending thoracic aorta  . Aortic dissection ([place]) 11/05/1998   S/P emergency repair of acute type A aortic dissection with resuspension of native aortic valve  . Aortic dissection, thoracic ([place])   . Aortic root aneurysm ([place])   . Atrial fibrillation ([place]) 01/30/2013   Atrial fibrillation   . Bilateral lower extremity edema   . BPH (benign prostatic hyperplasia)   . CHF (congestive heart failure) ([place]) 01/24/1946   2D Echo - EF 50-55%, mild-moderately dilated right ventricle, mild-moderate tricuspid valve regurgitation, moderately dilated right atrium  .  Depression   . Dyslipidemia   . Exertional shortness of breath    "for awhile now" (09/11/2013)  . Hemorrhoids   . History of blood transfusion   . Hypertension   . Mitral regurgitation 02/20/2013  . Multiple sclerosis ([place])   . Neuromuscular disorder ([place])   . Pleural effusion, right, large 01/30/2013  . Severe tricuspid valve regurgitation 01/30/2013   Severe tricuspid regurg by recent 2-D echo with a dilated tricuspid annulus, moderate pulmonary hypertension and biatrial enlargement   . Thrombocytopenia [facility])     Patient Active Problem List   Diagnosis Date Noted  . Aortic root aneurysm ([place])   . H/O aortic dissection 02/13/2016  . Aneurysm of aortic arch ([place]) 12/31/2014  . Tricuspid regurgitation 10/08/2014  . RVF (right ventricular failure) ([place]) 10/08/2014  . Chronic diastolic congestive heart failure ([place]) 10/09/2013  . Sinus pause 09/16/2013  . Anemia due to blood loss, acute in setting of supratheraputic INR 09/11/13 09/12/2013  . 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 Acute renal insufficiency 09/12/2013  . Anasarca 09/12/2013  . Anemia 09/12/2013  . Bilateral leg edema 09/11/2013  . Acute right-sided congestive heart failure ([place]) 09/11/2013  . Unstable gait 09/11/2013  . Dissection of aorta, thoracic- surgery Feb 2000 02/20/2013  . Mitral regurgitation 02/20/2013  . Lower extremity edema 02/20/2013  . Volume overload 01/30/2013  . Severe tricuspid valve regurgitation 01/30/2013  . Pleural effusion, right, large 01/30/2013  . MS (multiple sclerosis) ([place]) 01/13/2013  . Thrombocytopenia ([place]) 07/26/2011  .  Aortic dissection- chronic abd dissection  11/05/1998    Past Surgical History:  Procedure Laterality Date  . HEMORRHOID SURGERY  2009   Internal, external hemorrhoidectomy, general anesthesia,prone position. [Other]  . Open reduction and internal fixation of right distal radius fracture using [employer] distal radius volar  locking plate.  07/2005   Dr Ninfa Linden  . Repair of acute type A aortic dissection with resuspension of native aortic valve  10/15/1998   Dr Roxy Manns  . TEE WITHOUT CARDIOVERSION N/A 02/17/2013   Procedure: TRANSESOPHAGEAL ECHOCARDIOGRAM (TEE);  Surgeon: [name], MD;  Location: [facility] ENDOSCOPY;  Service: Cardiovascular;  Laterality: N/A;       Family History  Problem Relation Age of Onset  . Thyroid cancer Father        smoked  . Heart attack Mother   . Lung cancer Brother        smoked  . Emphysema Brother        smoked    Social History   Tobacco Use  . Smoking status: Never Smoker  . Smokeless tobacco: Never Used  Substance Use Topics  . Alcohol use: Yes    Comment: 09/11/2013 "might have a drink once/month"  . Drug use: No    Home Medications Prior to Admission medications   Medication Sig Start Date End Date Taking? Authorizing Provider  baclofen (LIORESAL) 10 MG tablet Take 1-2 tablets (10-20 mg total) by mouth 3 (three) times daily as needed for muscle spasms. 12/08/19   Hilts, Legrand Como, MD  Cholecalciferol (VITAMIN D3) 2000 UNITS TABS Take 1 capsule by mouth daily.    [provider]  COPAXONE 40 MG/ML SOSY INJECT ONE SYRINGE SUBCUTANEOUSLY 3 TIMES A WEEK AT LEAST 48 HOURS APART. ALLOW TO WARM TO ROOM TEMP FOR 20 MINUTES. REFRIGERATE. 12/14/19   Penumalli, Earlean Polka, MD  dalfampridine (AMPYRA) 10 MG TB12 TAKE ONE TABLET BY MOUTH TWICE DAILY (APPROXIMATELY 12 HOURS APART). MAY BE TAKEN WITH OR WITHOUT FOOD. DO NOT CUT OR CRUSH. STORE AT ROOM T 11/01/19   Penumalli, Earlean Polka, MD  diazepam (VALIUM) 5 MG tablet 1-2 PO 1 hour before MRI, repeat prn 12/06/19   Hilts, Legrand Como, MD  diclofenac Sodium (VOLTAREN) 1 % GEL Apply 4 g topically 4 (four) times daily as needed. 11/01/19   Hilts, Legrand Como, MD  docusate sodium (COLACE) 100 MG capsule Take 100 mg by mouth daily as needed for mild constipation.    [provider]  ELIQUIS 5 MG TABS tablet TAKE 1 TABLET BY MOUTH  TWICE DAILY. 02/26/20   Bensimhon, Shaune Pascal, MD  ferrous sulfate 325 (65 FE) MG tablet Take 1 tablet (325 mg total) by mouth 3 (three) times daily with meals. 09/18/13   Caren Griffins, MD  folic acid (FOLVITE) 1 MG tablet Take 1 mg by mouth daily.      [provider]  furosemide (LASIX) 40 MG tablet Take 2 tablets (80 mg total) by mouth daily. 01/09/20   Bensimhon, Shaune Pascal, MD  losartan (COZAAR) 100 MG tablet TAKE 1 TABLET ONCE DAILY. 01/16/20   Bensimhon, Shaune Pascal, MD  metolazone (ZAROXOLYN) 2.5 MG tablet Take 1 tablet (2.5 mg total) by mouth as directed. By HF clinic Patient not taking: Reported on 01/05/2020 12/20/19   Bensimhon, Shaune Pascal, MD  Multiple Vitamin (MULTIVITAMIN WITH MINERALS) TABS tablet Take 1 tablet by mouth daily. Centrum for men.    [provider]  potassium chloride SA (KLOR-CON) 20 MEQ tablet Take 2 tablets (40 mEq  total) by mouth as directed. ONLY WHEN YOU TAKE METOLAZONE Patient not taking: Reported on 01/05/2020 12/20/19   Bensimhon, Shaune Pascal, MD  QUEtiapine (SEROQUEL) 25 MG tablet Take 25 mg by mouth at bedtime as needed.     [provider]  spironolactone (ALDACTONE) 25 MG tablet TAKE 1 TABLET EACH DAY. 05/06/17   Bensimhon, Shaune Pascal, MD  traMADol (ULTRAM) 50 MG tablet Take 1 tablet (50 mg total) by mouth every 6 (six) hours as needed. 09/06/19   Hilts, Legrand Como, MD  triamcinolone cream (KENALOG) 0.1 % Apply 1 application topically daily as needed (rash).  11/06/14   [provider]    Allergies    Patient has no known allergies.  Review of Systems   Review of Systems 10 systems reviewed and negative except as per HPI Physical Exam Updated Vital Signs BP (!) 115/51   Pulse 79   Temp 98.7 F (37.1 C) (Oral)   Resp (!) 23   Ht 5\' 9"  (1.753 m)   Wt 76.2 kg   SpO2 97%   BMI 24.81 kg/m   Physical Exam Constitutional:      Comments: Alert and nontoxic.  Mental status clear.  No respiratory distress at rest.  HENT:     Head:  Normocephalic and atraumatic.  Eyes:     Extraocular Movements: Extraocular movements intact.  Cardiovascular:     Rate and Rhythm: Normal rate.     Comments: Irregularly irregular, rate controlled.   Pulmonary:     Effort: Pulmonary effort is normal.     Breath sounds: Normal breath sounds.  Abdominal:     General: There is no distension.     Palpations: Abdomen is soft.     Tenderness: There is no abdominal tenderness. There is no guarding.     Comments: No epigastric or central abdominal pain to deep palpation.  Musculoskeletal:     Comments: 2+ edema bilateral ankles and feet.  Calves nontender.  Skin:    General: Skin is warm and dry.  Neurological:     Comments: Patient is alert and appropriate.  Cognitive function intact.  Occasional spastic tremor.  General weakness for sitting forward in the stretcher without assistance.  Psychiatric:        Mood and Affect: Mood normal.     ED Results / Procedures / Treatments   Labs (all labs ordered are listed, but only abnormal results are displayed) Labs Reviewed  BASIC METABOLIC PANEL - Abnormal; Notable for the following components:      Result Value   Glucose, Bld 104 (*)    BUN 78 (*)    Creatinine, Ser 2.43 (*)    GFR calc non Af Amer 26 (*)    GFR calc Af Amer 30 (*)    All other components within normal limits  CBC - Abnormal; Notable for the following components:   RBC 4.10 (*)    Hemoglobin 11.6 (*)    HCT 37.0 (*)    Platelets 123 (*)    All other components within normal limits  HEPATIC FUNCTION PANEL - Abnormal; Notable for the following components:   Alkaline Phosphatase 138 (*)    All other components within normal limits  LIPASE, BLOOD  TROPONIN I (HIGH SENSITIVITY)  TROPONIN I (HIGH SENSITIVITY)    EKG EKG Interpretation  Date/Time:  Friday March 01 2020 16:10:96 EDT Ventricular Rate:  73 PR Interval:    QRS Duration: 121 QT Interval:  482 QTC Calculation: 532 R Axis:  74 Text  Interpretation: Atrial fibrillation IVCD, consider atypical RBBB agree, no sig change from previous Confirmed by Charlesetta Shanks [phone]) on 03/01/2020 10:50:09 AM   Radiology DG Chest Portable 1 View  Result Date: 03/01/2020 CLINICAL DATA:  Chest pain EXAM: PORTABLE CHEST 1 VIEW COMPARISON:  12/19/2017 FINDINGS: Cardiac shadow is enlarged but stable. Postsurgical changes are noted. The lungs are well aerated without focal infiltrate or sizable effusion. No acute bony abnormality is noted. IMPRESSION: Stable cardiomegaly. No acute abnormality noted. Electronically Signed   By: Inez Catalina M.D.   On: 03/01/2020 09:36    Procedures Procedures (including critical care time)  Medications Ordered in ED Medications  aspirin chewable tablet 324 mg (324 mg Oral Not Given 03/01/20 6503)    ED Course  I have reviewed the triage vital signs and the nursing notes.  Pertinent labs & imaging results that were available during my care of the patient were reviewed by me and considered in my medical decision making (see chart for details).  Clinical Course as of Mar 01 1700  Fri Mar 01, 2020  1311 Consult: Cardiothoracic.  Ryan, Dr. Ricard Dillon nurse has reviewed the case with him.  He does not recommend CT with contrast for the patient.  Advises patient is not a candidate for additional repair.  If needed, MRI could be obtained.  Patient can be seen in the outpatient clinic.  Thurmond Butts will set up follow-up appointment.   [MP]    Clinical Course User Index [MP] Charlesetta Shanks, MD   MDM Rules/Calculators/A&P                          Patient has self-limited episode of pain in the chest that radiates to the abdomen.  This resolved within 15 minutes.  Patient has been well since arrival to the emergency department.  He is been able to take his medications and eat.  Vital signs have remained stable.  Patient is not hypertensive.  Patient has significant risk factors with underlying aortic dissection with repair and  partially unrepaired segment.  Patient however has renal insufficiency, increasing risk of kidney failure with dissection study.  Risk-benefit at this time is in favor of not proceeding with the study.  This has been reviewed with [name] who is familiar with the patient's case.  Patient will be seen in follow-up for continued monitoring.  Troponins have not elevated, no sign of NSTEMI.  Patient stable for discharge at this time. Final Clinical Impression(s) / ED Diagnoses Final diagnoses:  Chest pain, unspecified type  Severe comorbid illness    Rx / DC Orders ED Discharge Orders    None       Charlesetta Shanks, MD 03/01/20 [phone]

## 2020-03-01 NOTE — ED Triage Notes (Signed)
While resting had sudden severe CP without radiation  noted diaphoresis and nausea.  Wife called EMS thinking he was having a heart attack.  Hx of MS and Afib and slow response.  On arrival states pain resolved without nausea, no resp distress.  On arrival alert and oriented with no pain responses noted.  Calm and pleasant.

## 2020-03-01 NOTE — Discharge Instructions (Addendum)
1.  Continue your regular medications. 2.  [name] will see you in follow-up at the outpatient clinic.

## 2020-03-04 ENCOUNTER — Ambulatory Visit: Payer: Medicare Other | Admitting: Psychology

## 2020-03-06 ENCOUNTER — Encounter: Payer: Self-pay | Admitting: Thoracic Surgery (Cardiothoracic Vascular Surgery)

## 2020-03-07 ENCOUNTER — Other Ambulatory Visit: Payer: Self-pay | Admitting: *Deleted

## 2020-03-07 ENCOUNTER — Telehealth (HOSPITAL_COMMUNITY): Payer: Self-pay | Admitting: *Deleted

## 2020-03-07 NOTE — Telephone Encounter (Signed)
I saw him recently and felt he was very stable. Happy to see him if [name] feels it is needed.

## 2020-03-07 NOTE — Telephone Encounter (Signed)
Pt called requesting an office visit after being seen in the ED for chest pain on 8/18. Per ED note pt was seen by Dr.Owen and will have a MRA and then follow up with cardiac thoracic surgery see below.   "Patient has self-limited episode of pain in the chest that radiates to the abdomen.  This resolved within 15 minutes.  Patient has been well since arrival to the emergency department.  He is been able to take his medications and eat.  Vital signs have remained stable.  Patient is not hypertensive.  Patient has significant risk factors with underlying aortic dissection with repair and partially unrepaired segment.  Patient however has renal insufficiency, increasing risk of kidney failure with dissection study.  Risk-benefit at this time is in favor of not proceeding with the study.  This has been reviewed with [name] who is familiar with the patient's case.  Patient will be seen in follow-up for continued monitoring. "  Routed to Dr.Bensimhon to see if patient also needs to be seen in our clinic.

## 2020-03-08 ENCOUNTER — Encounter: Payer: Self-pay | Admitting: Thoracic Surgery (Cardiothoracic Vascular Surgery)

## 2020-03-15 ENCOUNTER — Other Ambulatory Visit: Payer: Self-pay | Admitting: Diagnostic Neuroimaging

## 2020-03-18 ENCOUNTER — Ambulatory Visit (INDEPENDENT_AMBULATORY_CARE_PROVIDER_SITE_OTHER): Payer: Medicare Other | Admitting: Psychology

## 2020-03-18 DIAGNOSIS — F4323 Adjustment disorder with mixed anxiety and depressed mood: Secondary | ICD-10-CM

## 2020-03-28 ENCOUNTER — Ambulatory Visit
Admission: RE | Admit: 2020-03-28 | Discharge: 2020-03-28 | Disposition: A | Discharge status: Home / Self Care | Payer: Medicare Other | Source: Ambulatory Visit | Attending: Thoracic Surgery (Cardiothoracic Vascular Surgery) | Admitting: Thoracic Surgery (Cardiothoracic Vascular Surgery)

## 2020-03-28 ENCOUNTER — Other Ambulatory Visit: Payer: Self-pay

## 2020-03-28 DIAGNOSIS — R0782 Intercostal pain: Secondary | ICD-10-CM

## 2020-03-28 MED ORDER — GADOBENATE DIMEGLUMINE 529 MG/ML IV SOLN
10.0000 mL | Freq: Once | INTRAVENOUS | Status: AC | PRN
  Administered 2020-03-28: 10 mL via INTRAVENOUS

## 2020-04-01 ENCOUNTER — Ambulatory Visit (INDEPENDENT_AMBULATORY_CARE_PROVIDER_SITE_OTHER): Payer: Medicare Other | Admitting: Psychology

## 2020-04-01 ENCOUNTER — Ambulatory Visit: Payer: Medicare Other | Admitting: Thoracic Surgery (Cardiothoracic Vascular Surgery)

## 2020-04-01 DIAGNOSIS — F4323 Adjustment disorder with mixed anxiety and depressed mood: Secondary | ICD-10-CM

## 2020-04-11 ENCOUNTER — Other Ambulatory Visit: Payer: Medicare Other

## 2020-04-15 ENCOUNTER — Ambulatory Visit (INDEPENDENT_AMBULATORY_CARE_PROVIDER_SITE_OTHER): Payer: Medicare Other | Admitting: Psychology

## 2020-04-15 ENCOUNTER — Ambulatory Visit: Payer: Medicare Other | Admitting: Thoracic Surgery (Cardiothoracic Vascular Surgery)

## 2020-04-15 DIAGNOSIS — F4323 Adjustment disorder with mixed anxiety and depressed mood: Secondary | ICD-10-CM | POA: Diagnosis not present

## 2020-04-18 ENCOUNTER — Other Ambulatory Visit (HOSPITAL_COMMUNITY): Payer: Self-pay | Admitting: Internal Medicine

## 2020-04-22 ENCOUNTER — Other Ambulatory Visit: Payer: Self-pay

## 2020-04-22 ENCOUNTER — Ambulatory Visit (INDEPENDENT_AMBULATORY_CARE_PROVIDER_SITE_OTHER): Payer: Medicare Other | Admitting: Thoracic Surgery (Cardiothoracic Vascular Surgery)

## 2020-04-22 ENCOUNTER — Encounter: Payer: Self-pay | Admitting: Thoracic Surgery (Cardiothoracic Vascular Surgery)

## 2020-04-22 VITALS — BP 111/70 | HR 70 | Temp 97.8°F | Resp 20 | Ht 69.0 in | Wt 170.0 lb

## 2020-04-22 DIAGNOSIS — I71019 Dissection of thoracic aorta, unspecified: Secondary | ICD-10-CM

## 2020-04-22 DIAGNOSIS — I7101 Dissection of thoracic aorta: Secondary | ICD-10-CM | POA: Diagnosis not present

## 2020-04-22 DIAGNOSIS — I719 Aortic aneurysm of unspecified site, without rupture: Secondary | ICD-10-CM

## 2020-04-22 DIAGNOSIS — Q2543 Congenital aneurysm of aorta: Secondary | ICD-10-CM

## 2020-04-22 DIAGNOSIS — I71011 Dissection of aortic arch: Secondary | ICD-10-CM

## 2020-04-22 DIAGNOSIS — I7121 Aneurysm of the ascending aorta, without rupture: Secondary | ICD-10-CM

## 2020-04-22 NOTE — Patient Instructions (Signed)
Continue all previous medications without any changes at this time  

## 2020-04-22 NOTE — Progress Notes (Addendum)
[address]       [place], [address]             [phone]     CARDIOTHORACIC SURGERY OFFICE NOTE  Referring Provider is [name], MD Advanced Heart Failure Cardiologist is Bensimhon, Shaune Pascal, MD Primary Neurologist is Penni Bombard, MD PCP is Sueanne Margarita, DO   HPI:  Patient is a 74 year old male with complex past medical history including severe multiple sclerosis who returns to the office today for routine follow-up of chronic type A aortic dissection status post repair in 2000 with progressive aneurysmal enlargement of the remaining dilated aortic root and chronically dissected transverse aortic arch. The patient also has chronic diastolic and right sided congestive heart failure, tricuspid regurgitation, and atrial fibrillation.  He has not felt to be surgical candidate under any circumstances for surgical treatment of his slowly enlarging aortic root aneurysm nor his chronically dissected transverse and descending thoracic aorta.  He was last seen in follow-up in our office on 12/27/2017.  Patient reports that recently he has been doing okay.  He was last seen in follow-up by [name] in April of this year at which time follow-up echocardiogram revealed ejection fraction 55 to 60% with mildly dilated right ventricle and normal right ventricular function.  There was mild to moderate tricuspid regurgitation, notably decreased in comparison with previous echocardiograms.    In June of this year the patient was seen in the emergency department for an episode of chest pain that radiated down his left side into his abdomen but spontaneously resolved within 10 to 15 minutes.  At the time his BUN and creatinine were elevated, raising concerns for possible dehydration.  CT angiogram of the chest, abdomen, and pelvis was not performed due to concerns regarding risk of nephrotoxicity and follow-up in our office was recommended.  The patient recently  underwent MR angiography and returns to our office for follow-up.  He states that he has not had any further episodes of pain in his chest or abdomen such as that which occurred in June.  He states that approximately 2 weeks ago he was seen briefly in his primary care physician's office.  Since then he has been back to his normal activity.  He has been swimming recently and denies any associated symptoms of chest discomfort or shortness of breath.   Current Outpatient Medications  Medication Sig Dispense Refill  . baclofen (LIORESAL) 10 MG tablet Take 1-2 tablets (10-20 mg total) by mouth 3 (three) times daily as needed for muscle spasms. 90 tablet 3  . Cholecalciferol (VITAMIN D3) 2000 UNITS TABS Take 1 capsule by mouth daily.    Marland Kitchen COPAXONE 40 MG/ML SOSY INJECT ONE SYRINGE SUBCUTANEOUSLY 3 TIMES A WEEK AT LEAST 48 HOURS APART. ALLOW TO WARM TO ROOM TEMP FOR 20 MINUTES. REFRIGERATE. 36 mL 2  . dalfampridine (AMPYRA) 10 MG TB12 TAKE ONE TABLET BY MOUTH TWICE DAILY (APPROXIMATELY 12 HOURS APART). MAY BE TAKEN WITH OR WITHOUT FOOD. DO NOT CUT OR CRUSH. STORE AT ROOM T 180 tablet 3  . diclofenac Sodium (VOLTAREN) 1 % GEL Apply 4 g topically 4 (four) times daily as needed. 500 g 6  . docusate sodium (COLACE) 100 MG capsule Take 100 mg by mouth daily as needed for mild constipation.    Marland Kitchen ELIQUIS 5 MG TABS tablet TAKE 1 TABLET BY MOUTH TWICE DAILY. 180 tablet 3  . ferrous sulfate 325 (65 FE) MG tablet Take  1 tablet (325 mg total) by mouth 3 (three) times daily with meals. 90 tablet 1  . folic acid (FOLVITE) 1 MG tablet Take 1 mg by mouth daily.      . furosemide (LASIX) 40 MG tablet Take 2 tablets (80 mg total) by mouth daily. 60 tablet 4  . losartan (COZAAR) 100 MG tablet TAKE 1 TABLET ONCE DAILY. 90 tablet 3  . Multiple Vitamin (MULTIVITAMIN WITH MINERALS) TABS tablet Take 1 tablet by mouth daily. Centrum for men.    Marland Kitchen QUEtiapine (SEROQUEL) 25 MG tablet Take 25 mg by mouth at bedtime as needed.     .  diazepam (VALIUM) 5 MG tablet 1-2 PO 1 hour before MRI, repeat prn (Patient not taking: Reported on 04/22/2020) 5 tablet 0  . triamcinolone cream (KENALOG) 0.1 % Apply 1 application topically daily as needed (rash).  (Patient not taking: Reported on 04/22/2020)     No current facility-administered medications for this visit.      Physical Exam:   BP 111/70   Pulse 70   Temp 97.8 F (36.6 C) (Skin)   Resp 20   Ht 5\' 9"  (1.753 m)   Wt 170 lb (77.1 kg)   SpO2 98% Comment: RA  BMI 25.10 kg/m   General:  Weak and frail appearing  Chest:   Clear to auscultation  CV:   Regular rate and rhythm  Incisions:  n/a  Abdomen:  Soft nontender  Extremities:  Warm and well-perfused  Diagnostic Tests:  MRA CHEST WITH OR WITHOUT CONTRAST  TECHNIQUE: Angiographic images of the chest were obtained using MRA technique without and with intravenous contrast.  CONTRAST:  77mL MULTIHANCE GADOBENATE DIMEGLUMINE 529 MG/ML IV SOLN  COMPARISON:  CTA 12/27/2017  FINDINGS: VASCULAR  Aorta: Postsurgical changes related to replacement of the ascending thoracic aorta. Again noted is enlargement of the aortic root due to dilatation of the non coronary sinus. Non coronary sinus measures up to 5.0 cm and previously measured 4.7 cm. Resolution on the study is limited compared to the previous CTA examination. Overall diameter of the aortic root on sequence 12, image 68 measures roughly 5.8 cm and previously measured 5.7 cm at a similar level. The replaced ascending thoracic aorta is patent. Again noted is an aortic dissection involving the proximal aortic arch just beyond the surgical anastomosis. Chronic aneurysmal dilatation of the aortic arch measuring up to 5.6 cm and stable. Configuration of the aortic dissection is unchanged. Again noted is dissection extending into the brachiocephalic artery. Left common carotid artery and left subclavian artery are patent without dissection. Bilateral  vertebral arteries are patent, left vertebral artery is dominant. Proximal descending thoracic aorta measures 3.4 cm and stable. Mid descending thoracic aorta measures 2.8 cm and stable. Descending thoracic aorta is tortuous. Distal descending thoracic aorta measures 2.8 cm and stable. Aortic dissection extends into the abdominal aorta. There is flow in the celiac trunk and SMA.  Heart: Heart is large for size. No significant pericardial effusion.  Pulmonary Arteries: Normal caliber of the pulmonary arteries. No large filling defects in the main pulmonary arteries.  NON-VASCULAR  Mediastinal structures are unremarkable. No large pleural effusion. No gross abnormality in the lungs.  IMPRESSION: 1. 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 This sinus now measures up to 5.0 cm and previously measured 4.7 cm. 2. Stable appearance of the replaced ascending thoracic aorta. 3. Stable size and appearance of the aortic dissection involving the aortic arch and descending thoracic aorta. Dissection extends  into the abdominal aorta and involves the brachiocephalic artery. Stable aneurysmal dilatation of the aortic arch measuring up to 5.6 cm. 4. Cardiomegaly.   Electronically Signed   By: Markus Daft M.D.   On: 03/29/2020 10:18    Impression:  I have personally reviewed the patient's recent MR angiogram of the aorta which reveals stable appearance of the previously replaced ascending thoracic aorta, stable size and appearance of the chronic dissection involving the transverse aortic arch and descending thoracic aorta.  Maximum diameter of the aortic arch remains approximately 5.6 cm, unchanged from April 2019.  There is chronic aneurysmal enlargement of the aortic root which may be slightly increasing in size in comparison with previous scans.  Maximum diameter of the aortic root is now reported 5.0 cm, up from 4.7 cm.  I doubt that the patient's brief episode of  transient chest pain for which he was seen in the emergency department last June was related to his aortic dissection or chronic dilatation of the aortic root.  Moreover, the relative stability of the patient's MRA and symptoms congestive heart failure are encouraging.  Nevertheless, the patient would not be considered a candidate for either elective or emergent salvage surgical treatment of his aortic root aneurysm and/or his chronically dissected thoracic aorta under any circumstances.    Plan:  The patient will undergo follow-up surveillance MR angiography in approximately 1 year.  We have not made any recommendations regarding changes to the patient's current medications.  All questions answered.   I spent in excess of 30 minutes during the conduct of this office consultation and >50% of this time involved direct face-to-face encounter with the patient for counseling and/or coordination of their care.    Valentina Gu. Roxy Manns, MD 04/22/2020 2:32 PM

## 2020-04-29 ENCOUNTER — Ambulatory Visit: Payer: BC Managed Care – PPO | Admitting: Psychology

## 2020-04-29 ENCOUNTER — Ambulatory Visit (INDEPENDENT_AMBULATORY_CARE_PROVIDER_SITE_OTHER): Payer: Medicare Other | Admitting: Psychology

## 2020-04-29 DIAGNOSIS — F4323 Adjustment disorder with mixed anxiety and depressed mood: Secondary | ICD-10-CM | POA: Diagnosis not present

## 2020-05-13 ENCOUNTER — Ambulatory Visit (INDEPENDENT_AMBULATORY_CARE_PROVIDER_SITE_OTHER): Payer: Medicare Other | Admitting: Psychology

## 2020-05-13 DIAGNOSIS — F4323 Adjustment disorder with mixed anxiety and depressed mood: Secondary | ICD-10-CM

## 2020-05-14 ENCOUNTER — Ambulatory Visit: Payer: Self-pay | Attending: Critical Care Medicine

## 2020-05-14 DIAGNOSIS — Z23 Encounter for immunization: Secondary | ICD-10-CM

## 2020-05-14 NOTE — Progress Notes (Signed)
   Covid-19 Vaccination Clinic  Name:  [name]    MRN: [id number] DOB: 06/08/1946  05/14/2020  [name] was observed post Covid-19 immunization for 15 minutes without incident. He was provided with Vaccine Information Sheet and instruction to access the V-Safe system.   [name] was instructed to call 911 with any severe reactions post vaccine: Marland Kitchen Difficulty breathing  . Swelling of face and throat  . A fast heartbeat  . A bad rash all over body  . Dizziness and weakness

## 2020-05-24 ENCOUNTER — Other Ambulatory Visit (HOSPITAL_COMMUNITY): Payer: Self-pay | Admitting: Internal Medicine

## 2020-05-27 ENCOUNTER — Ambulatory Visit (INDEPENDENT_AMBULATORY_CARE_PROVIDER_SITE_OTHER): Payer: Medicare Other | Admitting: Psychology

## 2020-05-27 DIAGNOSIS — F4323 Adjustment disorder with mixed anxiety and depressed mood: Secondary | ICD-10-CM | POA: Diagnosis not present

## 2020-06-10 ENCOUNTER — Ambulatory Visit (INDEPENDENT_AMBULATORY_CARE_PROVIDER_SITE_OTHER): Payer: Medicare Other | Admitting: Psychology

## 2020-06-10 DIAGNOSIS — F4323 Adjustment disorder with mixed anxiety and depressed mood: Secondary | ICD-10-CM

## 2020-06-24 ENCOUNTER — Ambulatory Visit (INDEPENDENT_AMBULATORY_CARE_PROVIDER_SITE_OTHER): Payer: Medicare Other | Admitting: Psychology

## 2020-06-24 DIAGNOSIS — F4323 Adjustment disorder with mixed anxiety and depressed mood: Secondary | ICD-10-CM | POA: Diagnosis not present

## 2020-07-08 ENCOUNTER — Ambulatory Visit (INDEPENDENT_AMBULATORY_CARE_PROVIDER_SITE_OTHER): Payer: Medicare Other | Admitting: Psychology

## 2020-07-08 DIAGNOSIS — F4323 Adjustment disorder with mixed anxiety and depressed mood: Secondary | ICD-10-CM

## 2020-07-14 ENCOUNTER — Encounter: Payer: Self-pay | Admitting: Thoracic Surgery (Cardiothoracic Vascular Surgery)

## 2020-07-22 ENCOUNTER — Ambulatory Visit (INDEPENDENT_AMBULATORY_CARE_PROVIDER_SITE_OTHER): Payer: Medicare Other | Admitting: Psychology

## 2020-07-22 DIAGNOSIS — F4323 Adjustment disorder with mixed anxiety and depressed mood: Secondary | ICD-10-CM | POA: Diagnosis not present

## 2020-08-05 ENCOUNTER — Ambulatory Visit (INDEPENDENT_AMBULATORY_CARE_PROVIDER_SITE_OTHER): Payer: Medicare Other | Admitting: Psychology

## 2020-08-05 DIAGNOSIS — F4323 Adjustment disorder with mixed anxiety and depressed mood: Secondary | ICD-10-CM

## 2020-08-19 ENCOUNTER — Ambulatory Visit (INDEPENDENT_AMBULATORY_CARE_PROVIDER_SITE_OTHER): Payer: Medicare Other | Admitting: Psychology

## 2020-08-19 DIAGNOSIS — F4323 Adjustment disorder with mixed anxiety and depressed mood: Secondary | ICD-10-CM

## 2020-09-02 ENCOUNTER — Ambulatory Visit: Payer: Medicare Other | Admitting: Psychology

## 2020-09-16 ENCOUNTER — Ambulatory Visit: Payer: Medicare Other | Admitting: Psychology

## 2020-09-19 ENCOUNTER — Other Ambulatory Visit (HOSPITAL_COMMUNITY): Payer: Self-pay | Admitting: Internal Medicine

## 2020-09-23 ENCOUNTER — Ambulatory Visit (INDEPENDENT_AMBULATORY_CARE_PROVIDER_SITE_OTHER): Payer: Medicare Other | Admitting: Diagnostic Neuroimaging

## 2020-09-23 ENCOUNTER — Encounter: Payer: Self-pay | Admitting: Diagnostic Neuroimaging

## 2020-09-23 VITALS — BP 126/74 | HR 84 | Ht 68.0 in | Wt 166.0 lb

## 2020-09-23 DIAGNOSIS — G35 Multiple sclerosis: Secondary | ICD-10-CM | POA: Diagnosis not present

## 2020-09-23 DIAGNOSIS — R269 Unspecified abnormalities of gait and mobility: Secondary | ICD-10-CM

## 2020-09-23 NOTE — Progress Notes (Signed)
PATIENT: [name] DOB: Jan 29, 1946   REASON FOR VISIT: follow up for MS HISTORY FROM: patient  Chief Complaint  Patient presents with  . Follow-up    "I think MS has kicked up faster/everything is harder to do? Room 6,alone in room     HISTORY OF PRESENT ILLNESS:  UPDATE (09/23/20, VRP): Since last visit, doing slightly worse with MS symptoms, fatigue, memory weakness. Symptoms are progressive. Severity is moderate. No alleviating or aggravating factors.   UPDATE (12/18/19, VRP): Since last visit, doing well. Symptoms are stable. Still working on right knee issues. Has stationary bike at home now. No alleviating or aggravating factors. Tolerating meds. Playing chess a lot nowadays.   UPDATE (09/18/19, VRP): Since last visit, doing well until 2 weeks ago (mild cold sxs x 1 day; then generalized weakness). Right knee locked up; saw ortho and it was drained and injected --> now better.   UPDATE (04/18/19, VRP): Since last visit, doing WELL. Symptoms are stable / improved slightly. No alleviating or aggravating factors. Tolerating copaxone.  Now back to swimming exercises.  UPDATE (10/05/18, VRP): Since last visit, doing about the same. Symptoms are stable. Patient wants to consider staying on tecfidera, in spite of low lymphocyte count and risk of PML. No alleviating or aggravating factors.  UPDATE (08/22/18, VRP): Since last visit, symptoms are slightly progressed (gait issues). No alleviating or aggravating factors. Tolerating tecfidera and ampyra. Staying active with swimming.  UPDATE (08/18/17, VRP): Since last visit, tolerating meds. No alleviating or aggravating factors. Ampyra helping somewhat. Having to lean on walker more.   UPDATE 08/18/16: Since last visit, tolerating meds. No new issues. Some slight subjective progression of symptoms. Had a fall recently with subsequent right forearm bruising.   UPDATE 02/11/16: Since last visit, tolerating tecfidera. Some slow progression.  Asking about new MS medications.  UPDATE 08/13/15: Tolerating meds. More urinary issues. Using walker mainly.   UPDATE 04/16/15: Since last visit, patient is stable. Getting home visits with palliative care.   UPDATE 02/05/15: Discussed palliative care consult and options.   UPDATE 08/02/14: Since last visit, feels stable. Tolerating tecfidera and ampyra. No new events. Uses cane and walker.  Swimming more. Wife notes memory issues slightly worse.   UPDATE 01/31/14: Since last visit, continues on tecfidera. No progression of neuro sxs or MS sxs. Asking about driving again. Has not driven in last 3-4 months. Ongoing medical mgmt of his cardiac issues, and he is not a surgical candidate.  UPDATE 10/25/13 (LL): Patient comes in for revisit, since last visit he had worsening right-sided HF and was hospitalized.  He was seen by [name] for surgical consultation to consider tricuspid valve repair and possible Maze procedure.  He was referred to the advanced heart failure clinic who has been adjusting his lasix to manage his lower extremity edema and shortness of breath.  He is to be seen by [name] again for follow up in another month, and says [name] felt that surgery was needed.  He comes in today to discuss with [name] how his MS would be affected by having the surgery.  His wife feels like he is having more difficulty walking since last visit, but he has not had any falls.  He is tolerating Tecfidera and Ampyra well.  UPDATE 07/19/13 (VP): Patient continues to have progressive deterioration in cognitive ability, memory, mood lability, gait stability. Patient is on tecfidera now, and tolerating without side effects. Patient feels when he came off of  Betaseron he had some accelerated progression of MS that this has slowed down since starting tecfidera. Unclear whether patient's current level of functioning is on the same trajectory as the decline that was noted while patient was on Betaseron, or  whether there has been some acceleration of deterioration. Other factors include some marital discord and comorbid depression. Other contributing factors include his cardiac issues including atrial fibrillation, on Coumadin, as well as severe tricuspid valve regurgitation leading to peripheral edema. Unfortunately patient is not felt to be a good surgical candidate.   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 The patient is accompanied by his wife. Pt. States he is doing worse since his last visit in Feb. He is walking slower and having more difficulty standing from a seated position. 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 [name] had talked to him about changing MS medications and he thinks he wants to. Having more memory problems. Continuing on betaseron right now.   PRIOR HPI (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 with a history of multiple sclerosis diagnosed in September 1996. He has been on Betaseron since 1996 and has right leg spasticity requiring him to use a cane in his left hand.He does not have any significant side effects from the Betaseron. Blood studies 02/10/2012 are normal CBC and CMP except for a low platelet count of 63K, Hgb 12.1 and alk Phos of 253. This is being followed by [name] and [name]. He has never had lymphopenia or neutropenia. There is no change in his symptoms. He denies bowel or bladder incontinence, weakness, Lhermitte's sign, or double vision. He swims 3 times per week for 10 laps which is a little over a quarter of a mile, claims he is winded afterwards. He has not fallen, uses a cane in his left hand. MRI of the brain and cervical spine with and without contrast enhancement 03/26/2010 showed multiple subcortical, periventricular, and brainstem white matter hyperintensities without  enhancing lesions present. There were T1 black holes and atrophy present. The cervical MRI showed a large central disc protrusion at C5-6 and spinal cord hyperintensities at C4 and brain stem representing remote demyelinating plaques without enhancing lesions present. Ampyra was started November 2012 with improvement in gait. He was pleased with the addition of the drug and his response. Pt denies injection site problems however he says he is tired of shots. He has been on [employer] since 1996 and we have talked about whether changing to an oral agent should be considered. He had several falls with his right leg giving way on standing. He has right knee pain. He states his memory is worse. He denies numbness delusions or depression. 07/11/2012= (MMSE29/30. Clock drawing task4/4. Animal fluency test 17). His wife has noted more problems walking which the patient is hesitant to discuss. He does admit since calling me for a work in appointment , that he believes he is having more difficulty walking. He uses a walker at home and is noticeably slower. He uses the walker to get out of a bed that is low and out of a chair. He notices shortness of breath on swimming. His swallowing is okay except for occasional difficulty with liquids. He has right knee pain when walking. Betaseron seems to help his knee pain, walking, and his eyes after an injection. He is hesitant  to go off of the medication for 3 weeks. He has increased spasms in his right foot and leg that are also moving to the left leg. He has swelling in his legs treated with furosemide.    REVIEW OF SYSTEMS: Full 14 system review of systems performed and negative except: as per HPI.    ALLERGIES: No Known Allergies  HOME MEDICATIONS: Outpatient Medications Prior to Visit  Medication Sig Dispense Refill  . baclofen (LIORESAL) 10 MG tablet Take 1-2 tablets (10-20 mg total) by mouth 3 (three) times daily as needed for muscle spasms. 90 tablet 3  .  Cholecalciferol (VITAMIN D3) 2000 UNITS TABS Take 1 capsule by mouth daily.    Marland Kitchen COPAXONE 40 MG/ML SOSY INJECT ONE SYRINGE SUBCUTANEOUSLY 3 TIMES A WEEK AT LEAST 48 HOURS APART. ALLOW TO WARM TO ROOM TEMP FOR 20 MINUTES. REFRIGERATE. 36 mL 2  . dalfampridine (AMPYRA) 10 MG TB12 TAKE ONE TABLET BY MOUTH TWICE DAILY (APPROXIMATELY 12 HOURS APART). MAY BE TAKEN WITH OR WITHOUT FOOD. DO NOT CUT OR CRUSH. STORE AT ROOM T 180 tablet 3  . ELIQUIS 5 MG TABS tablet TAKE 1 TABLET BY MOUTH TWICE DAILY. 180 tablet 3  . ferrous sulfate 325 (65 FE) MG tablet Take 1 tablet (325 mg total) by mouth 3 (three) times daily with meals. 90 tablet 1  . folic acid (FOLVITE) 1 MG tablet Take 1 mg by mouth daily.    . furosemide (LASIX) 40 MG tablet TAKE 2 TABLETS DAILY. 60 tablet 0  . losartan (COZAAR) 100 MG tablet TAKE 1 TABLET ONCE DAILY. 90 tablet 3  . Multiple Vitamin (MULTIVITAMIN WITH MINERALS) TABS tablet Take 1 tablet by mouth daily. Centrum for men.    Marland Kitchen QUEtiapine (SEROQUEL) 25 MG tablet Take 25 mg by mouth at bedtime as needed.    . diazepam (VALIUM) 5 MG tablet 1-2 PO 1 hour before MRI, repeat prn 5 tablet 0  . diclofenac Sodium (VOLTAREN) 1 % GEL Apply 4 g topically 4 (four) times daily as needed. 500 g 6  . docusate sodium (COLACE) 100 MG capsule Take 100 mg by mouth daily as needed for mild constipation.    . triamcinolone cream (KENALOG) 0.1 % Apply 1 application topically daily as needed (rash).     No facility-administered medications prior to visit.    PAST MEDICAL HISTORY: Past Medical History:  Diagnosis Date  . Anemia   . Aneurysm of aortic arch ([place]) 04/04/2012   Chronic aneurysmal dilatation of aortic arch with chronic type A aortic dissection, s/p replacement of ascending thoracic aorta  . Aortic dissection ([place]) 11/05/1998   S/P emergency repair of acute type A aortic dissection with resuspension of native aortic valve  . Aortic dissection, thoracic ([place])   . Aortic root aneurysm ([place])    . Atrial fibrillation ([place]) 01/30/2013   Atrial fibrillation   . Bilateral lower extremity edema   . BPH (benign prostatic hyperplasia)   . CHF (congestive heart failure) ([place]) October 19, 1946   2D Echo - EF 50-55%, mild-moderately dilated right ventricle, mild-moderate tricuspid valve regurgitation, moderately dilated right atrium  . Depression   . Dyslipidemia   . Exertional shortness of breath    "for awhile now" (09/11/2013)  . Hemorrhoids   . History of blood transfusion   . Hypertension   . Mitral regurgitation 02/20/2013  . Multiple sclerosis ([place])   . Neuromuscular disorder ([place])   . Pleural effusion, right, large 01/30/2013  . Severe tricuspid valve regurgitation 01/30/2013  Severe tricuspid regurg by recent 2-D echo with a dilated tricuspid annulus, moderate pulmonary hypertension and biatrial enlargement   . Thrombocytopenia ([place])     PAST SURGICAL HISTORY: Past Surgical History:  Procedure Laterality Date  . HEMORRHOID SURGERY  2009   Internal, external hemorrhoidectomy, general anesthesia,prone position. [Other]  . Open reduction and internal fixation of right distal radius fracture using [employer] distal radius volar locking plate.  07/2005   Dr Ninfa Linden  . Repair of acute type A aortic dissection with resuspension of native aortic valve  10/15/1998   Dr Roxy Manns  . TEE WITHOUT CARDIOVERSION N/A 02/17/2013   Procedure: TRANSESOPHAGEAL ECHOCARDIOGRAM (TEE);  Surgeon: [name], MD;  [facility]COPY;  Service: Cardiovascular;  Laterality: N/A;    FAMILY HISTORY: Family History  Problem Relation Age of Onset  . Thyroid cancer Father        smoked  . Heart attack Mother   . Lung cancer Brother        smoked  . Emphysema Brother        smoked    SOCIAL HISTORY: Social History   Socioeconomic History  . Marital status: Married    Spouse name: Jackelyn Poling  . Number of children: 2  . Years of education: Ph.D  . Highest education level: Not on file   Occupational History  . Occupation: Retired Professor    Fish farm manager: [employer]    Comment: Statistics  Tobacco Use  . Smoking status: Never Smoker  . Smokeless tobacco: Never Used  Substance and Sexual Activity  . Alcohol use: Yes    Comment: 09/11/2013 "might have a drink once/month"  . Drug use: No  . Sexual activity: Not Currently  Other Topics Concern  . Not on file  Social History Narrative   Patient lives at home with spouse.    Caffeine Use: eat a lot of chocolate, sodas, coffee and tea occasionally   Right Handed   Social Determinants of Health   Financial Resource Strain: Not on file  Food Insecurity: Not on file  Transportation Needs: Not on file  Physical Activity: Not on file  Stress: Not on file  Social Connections: Not on file  Intimate Partner Violence: Not on file     PHYSICAL EXAM  Vitals:   09/23/20 1452  BP: 126/74  Pulse: 84  Weight: 166 lb (75.3 kg)  Height: '5\' 8"'  (1.727 m)   Body mass index is 25.24 kg/m.  Generalized: Well developed, in no acute distress  Neck: Supple, no carotid bruits  Cardiac: IRREG RATE RHYTHM, SYS MURMUR  Neurological examination   MENTAL STATUS: awake, alert, language fluent, comprehension intact, naming intact   CRANIAL NERVE: pupils equal and reactive to light, visual fields full to confrontation, extraocular muscles: SACCADIC BREAKDOWN OF SMOOTH PURSUIT; END GAZE NYSTAGMUS; PAST POINTING ON SACCADES. Facial sensation and WITH DECR RIGHT EYE CLOSURE WEAKNESS AND RIGHT LOWER FACIAL STRENGTH. SYNKINESES ON RIGHT FACE. Uvula midline, shoulder shrug symmetric, tongue midline.   MOTOR: INCREASED TONE IN BLE > BUE (RIGHT > LEFT). BUE (DELTOID 4, TRICEP 4, BICEP 5, GRIP 4+), RIGHT LEG (HF 2, KE 3, KF 2, DF 1-2), LEFT LEG (HF 3, KE/KF 3, DF 3)  SENSORY: DECR IN RLE TO ALL MODALITIES.   COORDINATION: finger-nose-finger, fine finger movements normal   REFLEXES: BUE 3 (R>L), RLE 3, LLE 2.   GAIT/STATION: using  rollator   DIAGNOSTIC DATA (LABS, IMAGING, TESTING) - I reviewed patient records, labs, notes, testing and  imaging myself where available.  Lab Results  Component Value Date   WBC 6.7 03/01/2020   HGB 11.6 (L) 03/01/2020   HCT 37.0 (L) 03/01/2020   MCV 90.2 03/01/2020   PLT 123 (L) 03/01/2020   CMP Latest Ref Rng & Units 03/01/2020 01/05/2020 11/07/2018  Glucose 70 - 99 mg/dL 104(H) 104(H) -  BUN 8 - 23 mg/dL 78(H) 89(H) -  Creatinine 0.61 - 1.24 mg/dL 2.43(H) 2.08(H) -  Sodium 135 - 145 mmol/L 138 139 -  Potassium 3.5 - 5.1 mmol/L 4.8 4.5 -  Chloride 98 - 111 mmol/L 106 103 -  CO2 22 - 32 mmol/L 22 26 -  Calcium 8.9 - 10.3 mg/dL 9.3 10.0 -  Total Protein 6.5 - 8.1 g/dL 7.0 - 7.6  Total Bilirubin 0.3 - 1.2 mg/dL 0.6 - 0.6  Alkaline Phos 38 - 126 U/L 138(H) - 185(H)  AST 15 - 41 U/L 22 - 30  ALT 0 - 44 U/L 27 - 33   lymph#  Date Value Ref Range Status  02/10/2012 0.9 0.9 - 3.3 10e3/uL Final  08/10/2011 1.1 0.9 - 3.3 10e3/uL Final  03/12/2010 0.8 (L) 0.9 - 3.3 10e3/uL Final   Lymphocytes Absolute  Date Value Ref Range Status  04/18/2019 0.3 (L) 0.7 - 3.1 x10E3/uL Final  01/02/2019 0.4 (L) 0.7 - 3.1 x10E3/uL Final  12/06/2018 0.5 (L) 0.7 - 3.1 x10E3/uL Final    11/16/12 MRI brain - There are multiple periventricular and subcortical and pontine chronic demyelinating plaques. There are several T1 black holes. Mild diffuse atrophy. No acute plaques. In comparison to MRI from 03/26/10, there is no significant change.   08/02/13 MRI brain - multiple brainstem, periventricular, corpus callosal and subcortical white matter hyperintensities compatible with chronic lesions of multiple sclerosis. No enhancing lesions are noted. The presence of T1 black holes and significant atrophy of corpus callosum and cortex indicates chronic disease.  11/16/12 MRI cervical spine - Multiple chronic demyelinating plaques at C2-3, C3-4 and C5-6. No acute plaques. At C5-6: Disc bulging with facet hypertrophy  with moderate spinal stenosis, severe right and moderate left foraminal stenosis. At C3-4, C4-5: Disc bulging with mild spinal stenosis and biforaminal stenosis. In comparison to MRI from 03/26/10 there is no significant change.   01/20/13 JCV antibody - positive, index 3.37 (H)    ASSESSMENT AND PLAN  75 y.o. old male with Hypertension; Anemia; Depression; Multiple sclerosis; Thrombocytopenia; Dyslipidemia; Aortic dissection, thoracic; Aortic dissection (11/05/1998); Aneurysm of aortic arch (04/04/2012); Chest pain (11/18/2009); CHF (congestive heart failure), Atrial fibrillation (01/30/2013); Pleural effusion, right, large (01/30/2013); Severe tricuspid valve regurgitation (01/30/2013); Mitral regurgitation (02/20/2013); and Lower extremity edema (02/20/2013) here with multiple sclerosis since 1996. Initially on betaseron, now on tecfidera.  Dx:  1. MS (multiple sclerosis) ([place])   2. Abnormality of gait and mobility      PLAN:  MULTIPLE SCLEROSIS - continue copaxone - continue ampyra - continue baclofen 49m three times a day  - continue swimming and PT exercises - high fall risk and on anti-coagulation for atrial fibrillation; precautions reviewed with patient  Orders Placed This Encounter  Procedures  . CBC with Differential/Platelet   Return in about 1 year (around 09/23/2021).     VPenni Bombard MD 15/91/6384 26:65PM Certified in Neurology, Neurophysiology and Neuroimaging  [facility] [address] [place] [phone]

## 2020-09-24 LAB — CBC WITH DIFFERENTIAL/PLATELET
Basophils Absolute: 0 10*3/uL (ref 0.0–0.2)
Basos: 1 %
EOS (ABSOLUTE): 0.1 10*3/uL (ref 0.0–0.4)
Eos: 2 %
Hematocrit: 36.6 % — ABNORMAL LOW (ref 37.5–51.0)
Hemoglobin: 12.2 g/dL — ABNORMAL LOW (ref 13.0–17.7)
Immature Grans (Abs): 0 10*3/uL (ref 0.0–0.1)
Immature Granulocytes: 0 %
Lymphocytes Absolute: 0.5 10*3/uL — ABNORMAL LOW (ref 0.7–3.1)
Lymphs: 9 %
MCH: 28.7 pg (ref 26.6–33.0)
MCHC: 33.3 g/dL (ref 31.5–35.7)
MCV: 86 fL (ref 79–97)
Monocytes Absolute: 0.5 10*3/uL (ref 0.1–0.9)
Monocytes: 10 %
Neutrophils Absolute: 3.8 10*3/uL (ref 1.4–7.0)
Neutrophils: 78 %
Platelets: 135 10*3/uL — ABNORMAL LOW (ref 150–450)
RBC: 4.25 x10E6/uL (ref 4.14–5.80)
RDW: 13.5 % (ref 11.6–15.4)
WBC: 4.9 10*3/uL (ref 3.4–10.8)

## 2020-09-25 ENCOUNTER — Other Ambulatory Visit: Payer: Self-pay | Admitting: Diagnostic Neuroimaging

## 2020-09-25 DIAGNOSIS — G35 Multiple sclerosis: Secondary | ICD-10-CM

## 2020-09-25 DIAGNOSIS — R269 Unspecified abnormalities of gait and mobility: Secondary | ICD-10-CM

## 2020-09-30 ENCOUNTER — Ambulatory Visit (INDEPENDENT_AMBULATORY_CARE_PROVIDER_SITE_OTHER): Payer: Medicare Other | Admitting: Psychology

## 2020-09-30 DIAGNOSIS — F4323 Adjustment disorder with mixed anxiety and depressed mood: Secondary | ICD-10-CM

## 2020-10-14 ENCOUNTER — Ambulatory Visit (INDEPENDENT_AMBULATORY_CARE_PROVIDER_SITE_OTHER): Payer: Medicare Other | Admitting: Psychology

## 2020-10-14 DIAGNOSIS — F4323 Adjustment disorder with mixed anxiety and depressed mood: Secondary | ICD-10-CM | POA: Diagnosis not present

## 2020-10-18 ENCOUNTER — Telehealth (HOSPITAL_COMMUNITY): Payer: Self-pay | Admitting: Pharmacy Technician

## 2020-10-18 NOTE — Telephone Encounter (Signed)
Received a PA request for Eliquis for this patient. Upon further review, the patient would need this request to be completed by Dr. Ricard Dillon office.   Called and let his pharmacy know where to send the PA to.  Charlann Boxer, CPhT

## 2020-10-22 ENCOUNTER — Telehealth (HOSPITAL_COMMUNITY): Payer: Self-pay | Admitting: *Deleted

## 2020-10-22 NOTE — Telephone Encounter (Signed)
Pt called to enquire about his PA for his eliquis, he states he took his last pill this morning. Discussed w/Elizabeth Derrel Nip, CPhT she will work on PA, samples at front desk for pt to p/u, he is thankful  Medication Samples have been provided to the patient.  Drug name: Eliquis       Strength: 5 mg        Qty: 2  LOT: [id number]  Exp.Date: 4/24  Dosing instructions: take 1 tab Twice daily   The patient has been instructed regarding the correct time, dose, and frequency of taking this medication, including desired effects and most common side effects.   Estelle Skibicki 11:00 AM 10/22/2020

## 2020-10-23 ENCOUNTER — Telehealth (HOSPITAL_COMMUNITY): Payer: Self-pay | Admitting: Pharmacist

## 2020-10-23 ENCOUNTER — Other Ambulatory Visit (HOSPITAL_COMMUNITY): Payer: Self-pay | Admitting: Internal Medicine

## 2020-10-23 NOTE — Telephone Encounter (Addendum)
Patient Advocate Encounter   Received notification from [facility] that prior authorization for Eliquis is required.   PA submitted on CoverMyMeds Key [id number] Status is pending   Will continue to follow.   Audry Riles, PharmD, BCPS, BCCP, CPP Heart Failure Clinic Pharmacist [phone]

## 2020-10-23 NOTE — Telephone Encounter (Signed)
Advanced Heart Failure Patient Advocate Encounter  Prior Authorization for Eliquis has been approved.    Effective dates: 10/23/20 through 10/23/21  Audry Riles, PharmD, BCPS, BCCP, CPP Heart Failure Clinic Pharmacist [phone]

## 2020-10-28 ENCOUNTER — Ambulatory Visit (INDEPENDENT_AMBULATORY_CARE_PROVIDER_SITE_OTHER): Payer: Medicare Other | Admitting: Psychology

## 2020-10-28 DIAGNOSIS — F4323 Adjustment disorder with mixed anxiety and depressed mood: Secondary | ICD-10-CM

## 2020-11-04 ENCOUNTER — Other Ambulatory Visit (HOSPITAL_COMMUNITY): Payer: Self-pay | Admitting: Internal Medicine

## 2020-11-07 ENCOUNTER — Ambulatory Visit (INDEPENDENT_AMBULATORY_CARE_PROVIDER_SITE_OTHER): Payer: Medicare Other | Admitting: Psychology

## 2020-11-07 DIAGNOSIS — F4323 Adjustment disorder with mixed anxiety and depressed mood: Secondary | ICD-10-CM

## 2020-11-11 ENCOUNTER — Ambulatory Visit (INDEPENDENT_AMBULATORY_CARE_PROVIDER_SITE_OTHER): Payer: Medicare Other | Admitting: Psychology

## 2020-11-11 DIAGNOSIS — F4323 Adjustment disorder with mixed anxiety and depressed mood: Secondary | ICD-10-CM

## 2020-11-23 ENCOUNTER — Other Ambulatory Visit (HOSPITAL_COMMUNITY): Payer: Self-pay | Admitting: Internal Medicine

## 2020-11-25 ENCOUNTER — Ambulatory Visit (INDEPENDENT_AMBULATORY_CARE_PROVIDER_SITE_OTHER): Payer: Medicare Other | Admitting: Psychology

## 2020-11-25 DIAGNOSIS — F4323 Adjustment disorder with mixed anxiety and depressed mood: Secondary | ICD-10-CM

## 2020-12-09 ENCOUNTER — Ambulatory Visit: Payer: Medicare Other | Admitting: Psychology

## 2020-12-11 ENCOUNTER — Other Ambulatory Visit: Payer: Self-pay | Admitting: Diagnostic Neuroimaging

## 2020-12-12 ENCOUNTER — Telehealth: Payer: Self-pay | Admitting: *Deleted

## 2020-12-12 NOTE — Telephone Encounter (Signed)
Received fax form [facility], re: dalfampridine ER PA. Clinical quesitons answered, form on MD's desk for signature. Signed and faxed to [place].

## 2020-12-17 ENCOUNTER — Encounter: Payer: Self-pay | Admitting: *Deleted

## 2020-12-17 NOTE — Telephone Encounter (Signed)
Called [facility] PA dept, spoke with Terri who stated dalfampridine was Approved  12/12/20 -12/12/21. Sent patient my chart to advise.

## 2020-12-23 ENCOUNTER — Ambulatory Visit: Payer: Medicare Other | Admitting: Psychology

## 2020-12-24 ENCOUNTER — Other Ambulatory Visit: Payer: Self-pay | Admitting: Family Medicine

## 2020-12-30 ENCOUNTER — Ambulatory Visit (INDEPENDENT_AMBULATORY_CARE_PROVIDER_SITE_OTHER): Payer: Medicare Other | Admitting: Psychology

## 2020-12-30 DIAGNOSIS — F0631 Mood disorder due to known physiological condition with depressive features: Secondary | ICD-10-CM | POA: Diagnosis not present

## 2021-01-03 ENCOUNTER — Other Ambulatory Visit (HOSPITAL_COMMUNITY): Payer: Self-pay | Admitting: Internal Medicine

## 2021-01-20 ENCOUNTER — Ambulatory Visit (INDEPENDENT_AMBULATORY_CARE_PROVIDER_SITE_OTHER): Payer: Medicare Other | Admitting: Psychology

## 2021-01-20 DIAGNOSIS — F0631 Mood disorder due to known physiological condition with depressive features: Secondary | ICD-10-CM

## 2021-01-22 ENCOUNTER — Ambulatory Visit: Payer: Medicare Other | Attending: Internal Medicine

## 2021-01-22 DIAGNOSIS — Z23 Encounter for immunization: Secondary | ICD-10-CM

## 2021-01-22 NOTE — Progress Notes (Signed)
   Covid-19 Vaccination Clinic  Name:  [name]    MRN: [id number] DOB: 27-May-1946  01/22/2021  [name] was observed post Covid-19 immunization for 15 minutes without incident. He was provided with Vaccine Information Sheet and instruction to access the V-Safe system.   [name] was instructed to call 911 with any severe reactions post vaccine: Marland Kitchen Difficulty breathing  . Swelling of face and throat  . A fast heartbeat  . A bad rash all over body  . Dizziness and weakness   Immunizations Administered    Name Date Dose VIS Date Route   PFIZER Comrnaty(Gray TOP) Covid-19 Vaccine 01/22/2021 12:01 PM 0.3 mL 08/22/2020 Intramuscular   Manufacturer: [employer]   Lot: [id number]   NDC: [phone]

## 2021-01-27 ENCOUNTER — Other Ambulatory Visit (HOSPITAL_COMMUNITY): Payer: Self-pay

## 2021-01-27 MED ORDER — COVID-19 MRNA VAC-TRIS(PFIZER) 30 MCG/0.3ML IM SUSP
INTRAMUSCULAR | 0 refills | Status: DC
  Filled 2021-01-27: qty 0.3, 17d supply, fill #0

## 2021-02-05 ENCOUNTER — Other Ambulatory Visit (HOSPITAL_COMMUNITY): Payer: Self-pay | Admitting: Internal Medicine

## 2021-02-06 ENCOUNTER — Ambulatory Visit: Payer: Medicare Other | Admitting: Psychology

## 2021-02-25 ENCOUNTER — Ambulatory Visit (INDEPENDENT_AMBULATORY_CARE_PROVIDER_SITE_OTHER): Payer: Medicare Other | Admitting: Psychology

## 2021-02-25 DIAGNOSIS — F0631 Mood disorder due to known physiological condition with depressive features: Secondary | ICD-10-CM

## 2021-03-06 ENCOUNTER — Other Ambulatory Visit (HOSPITAL_COMMUNITY): Payer: Self-pay | Admitting: Internal Medicine

## 2021-03-06 ENCOUNTER — Other Ambulatory Visit: Payer: Medicare Other

## 2021-03-10 ENCOUNTER — Ambulatory Visit (INDEPENDENT_AMBULATORY_CARE_PROVIDER_SITE_OTHER): Payer: Medicare Other | Admitting: Psychology

## 2021-03-10 DIAGNOSIS — F0631 Mood disorder due to known physiological condition with depressive features: Secondary | ICD-10-CM | POA: Diagnosis not present

## 2021-03-19 ENCOUNTER — Other Ambulatory Visit: Payer: Self-pay | Admitting: Cardiothoracic Surgery

## 2021-03-19 DIAGNOSIS — I712 Thoracic aortic aneurysm, without rupture, unspecified: Secondary | ICD-10-CM

## 2021-03-24 ENCOUNTER — Ambulatory Visit (INDEPENDENT_AMBULATORY_CARE_PROVIDER_SITE_OTHER): Payer: Medicare Other | Admitting: Psychology

## 2021-03-24 DIAGNOSIS — F0631 Mood disorder due to known physiological condition with depressive features: Secondary | ICD-10-CM | POA: Diagnosis not present

## 2021-03-31 ENCOUNTER — Ambulatory Visit: Payer: Medicare Other | Admitting: Psychology

## 2021-04-06 ENCOUNTER — Other Ambulatory Visit (HOSPITAL_COMMUNITY): Payer: Self-pay | Admitting: Internal Medicine

## 2021-04-07 ENCOUNTER — Telehealth (HOSPITAL_COMMUNITY): Payer: Self-pay | Admitting: Internal Medicine

## 2021-04-07 ENCOUNTER — Telehealth: Payer: Self-pay | Admitting: *Deleted

## 2021-04-07 NOTE — Telephone Encounter (Signed)
Received fax from [facility] re: Copaxone 40 mg PA.  Copaxone is preferred drug. PA form completed, on MD desk for review, signature.

## 2021-04-08 ENCOUNTER — Ambulatory Visit (INDEPENDENT_AMBULATORY_CARE_PROVIDER_SITE_OTHER): Payer: Medicare Other

## 2021-04-08 ENCOUNTER — Other Ambulatory Visit: Payer: Self-pay

## 2021-04-08 ENCOUNTER — Ambulatory Visit (INDEPENDENT_AMBULATORY_CARE_PROVIDER_SITE_OTHER): Payer: Medicare Other | Admitting: Family Medicine

## 2021-04-08 ENCOUNTER — Encounter: Payer: Self-pay | Admitting: Family Medicine

## 2021-04-08 DIAGNOSIS — S8002XA Contusion of left knee, initial encounter: Secondary | ICD-10-CM | POA: Diagnosis not present
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
## 2021-04-09 NOTE — Telephone Encounter (Signed)
Copaxone PA signed, faxed to [facility]. Received confirmation.

## 2021-04-11 ENCOUNTER — Other Ambulatory Visit: Payer: Self-pay | Admitting: Diagnostic Neuroimaging

## 2021-04-13 ENCOUNTER — Ambulatory Visit (INDEPENDENT_AMBULATORY_CARE_PROVIDER_SITE_OTHER): Payer: Medicare Other | Admitting: Psychology

## 2021-04-13 ENCOUNTER — Other Ambulatory Visit: Payer: Self-pay

## 2021-04-13 DIAGNOSIS — F0631 Mood disorder due to known physiological condition with depressive features: Secondary | ICD-10-CM | POA: Diagnosis not present

## 2021-04-14 ENCOUNTER — Telehealth: Payer: Self-pay | Admitting: *Deleted

## 2021-04-14 NOTE — Telephone Encounter (Signed)
Received fax from [facility]: Copaxone PFS 40 mg/mL shipped 04/09/21. Dalfampridine ER 10 mg shipped 04/09/21.

## 2021-04-18 ENCOUNTER — Other Ambulatory Visit (HOSPITAL_COMMUNITY): Payer: Self-pay | Admitting: Internal Medicine

## 2021-04-24 ENCOUNTER — Ambulatory Visit: Payer: Medicare Other | Admitting: Cardiothoracic Surgery

## 2021-04-27 ENCOUNTER — Ambulatory Visit (INDEPENDENT_AMBULATORY_CARE_PROVIDER_SITE_OTHER): Payer: Medicare Other | Admitting: Psychology

## 2021-04-27 DIAGNOSIS — F0631 Mood disorder due to known physiological condition with depressive features: Secondary | ICD-10-CM | POA: Diagnosis not present

## 2021-05-11 ENCOUNTER — Ambulatory Visit (INDEPENDENT_AMBULATORY_CARE_PROVIDER_SITE_OTHER): Payer: Medicare Other | Admitting: Psychology

## 2021-05-11 DIAGNOSIS — F0631 Mood disorder due to known physiological condition with depressive features: Secondary | ICD-10-CM

## 2021-05-26 ENCOUNTER — Other Ambulatory Visit (HOSPITAL_BASED_OUTPATIENT_CLINIC_OR_DEPARTMENT_OTHER): Payer: Self-pay | Admitting: Orthopaedic Surgery

## 2021-05-26 DIAGNOSIS — M25561 Pain in right knee: Secondary | ICD-10-CM

## 2021-05-27 ENCOUNTER — Encounter (HOSPITAL_BASED_OUTPATIENT_CLINIC_OR_DEPARTMENT_OTHER): Payer: Self-pay | Admitting: Orthopaedic Surgery

## 2021-05-27 ENCOUNTER — Other Ambulatory Visit: Payer: Self-pay

## 2021-05-27 ENCOUNTER — Ambulatory Visit (INDEPENDENT_AMBULATORY_CARE_PROVIDER_SITE_OTHER): Payer: Medicare Other | Admitting: Orthopaedic Surgery

## 2021-05-27 VITALS — BP 119/54 | Ht 68.0 in | Wt 169.0 lb

## 2021-05-27 DIAGNOSIS — M1711 Unilateral primary osteoarthritis, right knee: Secondary | ICD-10-CM | POA: Diagnosis not present

## 2021-05-27 MED ORDER — TRIAMCINOLONE ACETONIDE 40 MG/ML IJ SUSP
80.0000 mg | INTRAMUSCULAR | Status: AC | PRN
  Administered 2021-05-27: 80 mg via INTRA_ARTICULAR

## 2021-05-27 MED ORDER — LIDOCAINE HCL 1 % IJ SOLN
4.0000 mL | INTRAMUSCULAR | Status: AC | PRN
  Administered 2021-05-27: 4 mL

## 2021-05-27 NOTE — Progress Notes (Signed)
Chief Complaint: right knee pain     History of Present Illness:   Pain Score: 8/10 SANE: 60/100  [name] is a 75 y.o. male  with right knee pain for several years.  He states that this has worsened increasingly over the last several days at which point he noticed a sharp pain while getting out of the shower.  He describes the pain is on the lateral aspect of the knee and sharp.  He has not been taking any anti-inflammatories.  Denies any clicking or popping.  He has previously been treated by Dr. Derry Lory who performed an aspiration in 2021 with several weeks of relief.  He has previously gone to physical therapy who recommended a cycling program which she has done well with and continues to do routinely.  He does have a history of MS for which she uses bilateral AFOs.  Surgical History:   None  PMH/PSH/Family History/Social History/Meds/Allergies:    Past Medical History:  Diagnosis Date  . Anemia   . Aneurysm of aortic arch ([place]) 04/04/2012   Chronic aneurysmal dilatation of aortic arch with chronic type A aortic dissection, s/p replacement of ascending thoracic aorta  . Aortic dissection ([place]) 11/05/1998   S/P emergency repair of acute type A aortic dissection with resuspension of native aortic valve  . Aortic dissection, thoracic ([place])   . Aortic root aneurysm ([place])   . Atrial fibrillation ([place]) 01/30/2013   Atrial fibrillation   . Bilateral lower extremity edema   . BPH (benign prostatic hyperplasia)   . CHF (congestive heart failure) ([place]) 1946/12/29   2D Echo - EF 50-55%, mild-moderately dilated right ventricle, mild-moderate tricuspid valve regurgitation, moderately dilated right atrium  . Depression   . Dyslipidemia   . Exertional shortness of breath    "for awhile now" (09/11/2013)  . Hemorrhoids   . History of blood transfusion   . Hypertension   . Mitral regurgitation 02/20/2013  . Multiple sclerosis ([place])   . Neuromuscular  disorder ([place])   . Pleural effusion, right, large 01/30/2013  . Severe tricuspid valve regurgitation 01/30/2013   Severe tricuspid regurg by recent 2-D echo with a dilated tricuspid annulus, moderate pulmonary hypertension and biatrial enlargement   . Thrombocytopenia ([place])    Past Surgical History:  Procedure Laterality Date  . HEMORRHOID SURGERY  2009   Internal, external hemorrhoidectomy, general anesthesia,prone position. [Other]  . Open reduction and internal fixation of right distal radius fracture using [employer] distal radius volar locking plate.  07/2005   Dr Ninfa Linden  . Repair of acute type A aortic dissection with resuspension of native aortic valve  10/15/1998   Dr Roxy Manns  . TEE WITHOUT CARDIOVERSION N/A 02/17/2013   Procedure: TRANSESOPHAGEAL ECHOCARDIOGRAM (TEE);  Surgeon: [name], MD;  Location: [facility] ENDOSCOPY;  Service: Cardiovascular;  Laterality: N/A;   Social History   Socioeconomic History  . Marital status: Married    Spouse name: Jackelyn Poling  . Number of children: 2  . Years of education: Ph.D  . Highest education level: Not on file  Occupational History  . Occupation: Retired Professor    Fish farm manager: [employer]    Comment: Statistics  Tobacco Use  . Smoking status: Never  . Smokeless tobacco: Never  Substance and Sexual Activity  . Alcohol use: Yes  Comment: 09/11/2013 "might have a drink once/month"  . Drug use: No  . Sexual activity: Not Currently  Other Topics Concern  . Not on file  Social History Narrative   Patient lives at home with spouse.    Caffeine Use: eat a lot of chocolate, sodas, coffee and tea occasionally   Right Handed   Social Determinants of Health   Financial Resource Strain: Not on file  Food Insecurity: Not on file  Transportation Needs: Not on file  Physical Activity: Not on file  Stress: Not on file  Social Connections: Not on file   Family History  Problem Relation Age of Onset  . Thyroid cancer Father         smoked  . Heart attack Mother   . Lung cancer Brother        smoked  . Emphysema Brother        smoked   No Known Allergies Current Outpatient Medications  Medication Sig Dispense Refill  . baclofen (LIORESAL) 10 MG tablet TAKE 1 OR 2 TABLETS THREE TIMES DAILY AS NEEDED FOR MUSCLE SPASM. 90 tablet 3  . Cholecalciferol (VITAMIN D3) 2000 UNITS TABS Take 1 capsule by mouth daily.    Marland Kitchen COPAXONE 40 MG/ML SOSY INJECT ONE SYRINGE SUBCUTANEOUSLY 3 TIMES A WEEK AT LEAST 48 HOURS APART. ALLOW TO WARM TO ROOM TEMP FOR 20 MINUTES. REFRIGERATE. 36 mL 2  . COVID-19 mRNA Vac-TriS, Pfizer, SUSP injection Inject into the muscle. 0.3 mL 0  . dalfampridine 10 MG TB12 TAKE ONE TABLET BY MOUTH TWICE DAILY (APPROXIMATELY 12 HOURS APART). MAY BE TAKEN WITH OR WITHOUT FOOD. DO NOT CUT OR CRUSH. STORE AT ROOM TEMPERATURE. 180 tablet 2  . diazepam (VALIUM) 5 MG tablet 1-2 PO 1 hour before MRI, repeat prn 5 tablet 0  . diclofenac Sodium (VOLTAREN) 1 % GEL Apply 4 g topically 4 (four) times daily as needed. 500 g 6  . docusate sodium (COLACE) 100 MG capsule Take 100 mg by mouth daily as needed for mild constipation.    Marland Kitchen ELIQUIS 5 MG TABS tablet TAKE 1 TABLET BY MOUTH TWICE DAILY. 120 tablet 0  . ferrous sulfate 325 (65 FE) MG tablet Take 1 tablet (325 mg total) by mouth 3 (three) times daily with meals. 90 tablet 1  . folic acid (FOLVITE) 1 MG tablet Take 1 mg by mouth daily.    . furosemide (LASIX) 40 MG tablet TAKE 2 TABLETS DAILY. 60 tablet 5  . losartan (COZAAR) 100 MG tablet Take 1 tablet (100 mg total) by mouth daily. Must keep further appointments for refills 60 tablet 0  . Multiple Vitamin (MULTIVITAMIN WITH MINERALS) TABS tablet Take 1 tablet by mouth daily. Centrum for men.    Marland Kitchen QUEtiapine (SEROQUEL) 25 MG tablet Take 25 mg by mouth at bedtime as needed.    . triamcinolone cream (KENALOG) 0.1 % Apply 1 application topically daily as needed (rash).     No current facility-administered medications for  this visit.   No results found.  Review of Systems:   A ROS was performed including pertinent positives and negatives as documented in the HPI.  Physical Exam :   Constitutional: NAD and appears stated age Neurological: Alert and oriented Psych: Appropriate affect and cooperative Blood pressure (!) 119/54, height '5\' 8"'$  (1.727 m), weight 169 lb (76.7 kg).   Comprehensive Musculoskeletal Exam:     Musculoskeletal Exam  Gait Normal  Alignment Normal   Right Left  Inspection AFO in place  AFO in place  Palpation    Tenderness Lateral joint line none  Crepitus None None  Effusion None None              Strength    Extension 5/5 5/5  Flexion 5/5 5/5  Ligament Exam     Generalized Laxity No No  Lachman Negative Negative   Pivot Shift Negative Negative  Anterior Drawer Negative Negative  Valgus at 0 Negative Negative  Valgus at 20 Negative Negative  Varus at 0 0 0  Varus at 20   0 0  Posterior Drawer at 90 0 0  Vascular/Lymphatic Exam    Edema None None  Venous Stasis Changes No No  Distal Circulation Normal Normal  Neurologic    Light Touch Sensation Intact Intact  Special Tests:      Imaging:     MRI (right knee): There is mild lateral joint space osteoarthritis  I personally reviewed and interpreted the radiographs.   Assessment:   75 year old male with mild right knee osteoarthritis.  He has been performing a home cycling program which I have encouraged.  Also plan for a right knee steroid injection at today's visit in order to provide pain relief.  Plan :    -He will see [place] back on an as-needed basis    Procedure Note  Patient: DANYEL GALLMAN             Date of Birth: 09-01-1946           MRN: [id number]             Visit Date: 05/27/2021  Procedures: Visit Diagnoses:  1. Unilateral primary osteoarthritis, right knee     Large Joint Inj on 05/27/2021 12:54 PM Indications: pain Details: 22 G 1.5 in needle, anterior approach  Arthrogram:  No  Medications: 4 mL lidocaine 1 %; 80 mg triamcinolone acetonide 40 MG/ML Outcome: tolerated well, no immediate complications Procedure, treatment alternatives, risks and benefits explained, specific risks discussed. Consent was given by the patient. Immediately prior to procedure a time out was called to verify the correct patient, procedure, equipment, support staff and site/side marked as required. Patient was prepped and draped in the usual sterile fashion.        I personally saw and evaluated the patient, and participated in the management and treatment plan.  Vanetta Mulders, MD Attending Physician, Orthopedic Surgery  This document was dictated using Dragon voice recognition software. A reasonable attempt at proof reading has been made to minimize errors.

## 2021-06-02 ENCOUNTER — Other Ambulatory Visit (HOSPITAL_COMMUNITY): Payer: Self-pay | Admitting: Internal Medicine

## 2021-06-04 ENCOUNTER — Other Ambulatory Visit (HOSPITAL_COMMUNITY): Payer: Self-pay | Admitting: Internal Medicine

## 2021-06-04 MED ORDER — LOSARTAN POTASSIUM 100 MG PO TABS
100.0000 mg | ORAL_TABLET | Freq: Every day | ORAL | 3 refills | Status: DC
Start: 2021-06-04 — End: 2022-02-02

## 2021-06-20 ENCOUNTER — Encounter (HOSPITAL_COMMUNITY): Payer: Self-pay | Admitting: Internal Medicine

## 2021-06-20 ENCOUNTER — Ambulatory Visit (HOSPITAL_COMMUNITY)
Admission: RE | Admit: 2021-06-20 | Discharge: 2021-06-20 | Disposition: A | Discharge status: Home / Self Care | Payer: Medicare Other | Source: Ambulatory Visit | Attending: Internal Medicine | Admitting: Internal Medicine

## 2021-06-20 ENCOUNTER — Other Ambulatory Visit: Payer: Self-pay

## 2021-06-20 VITALS — BP 122/80 | HR 73 | Wt 180.4 lb

## 2021-06-20 DIAGNOSIS — I5032 Chronic diastolic (congestive) heart failure: Secondary | ICD-10-CM | POA: Diagnosis not present

## 2021-06-20 DIAGNOSIS — Z7901 Long term (current) use of anticoagulants: Secondary | ICD-10-CM | POA: Diagnosis not present

## 2021-06-20 DIAGNOSIS — I7122 Aneurysm of the aortic arch, without rupture: Secondary | ICD-10-CM | POA: Insufficient documentation

## 2021-06-20 DIAGNOSIS — Z8249 Family history of ischemic heart disease and other diseases of the circulatory system: Secondary | ICD-10-CM | POA: Insufficient documentation

## 2021-06-20 DIAGNOSIS — Z79899 Other long term (current) drug therapy: Secondary | ICD-10-CM | POA: Diagnosis not present

## 2021-06-20 DIAGNOSIS — I7121 Aneurysm of the ascending aorta, without rupture: Secondary | ICD-10-CM | POA: Diagnosis not present

## 2021-06-20 DIAGNOSIS — I50810 Right heart failure, unspecified: Secondary | ICD-10-CM | POA: Diagnosis not present

## 2021-06-20 DIAGNOSIS — N184 Chronic kidney disease, stage 4 (severe): Secondary | ICD-10-CM | POA: Diagnosis not present

## 2021-06-20 DIAGNOSIS — I071 Rheumatic tricuspid insufficiency: Secondary | ICD-10-CM | POA: Diagnosis not present

## 2021-06-20 DIAGNOSIS — I482 Chronic atrial fibrillation, unspecified: Secondary | ICD-10-CM | POA: Insufficient documentation

## 2021-06-20 DIAGNOSIS — I130 Hypertensive heart and chronic kidney disease with heart failure and stage 1 through stage 4 chronic kidney disease, or unspecified chronic kidney disease: Secondary | ICD-10-CM | POA: Diagnosis not present

## 2021-06-20 DIAGNOSIS — I2729 Other secondary pulmonary hypertension: Secondary | ICD-10-CM | POA: Diagnosis not present

## 2021-06-20 DIAGNOSIS — G35 Multiple sclerosis: Secondary | ICD-10-CM | POA: Insufficient documentation

## 2021-06-20 LAB — COMPREHENSIVE METABOLIC PANEL
ALT: 39 U/L (ref 0–44)
AST: 30 U/L (ref 15–41)
Albumin: 4 g/dL (ref 3.5–5.0)
Alkaline Phosphatase: 166 U/L — ABNORMAL HIGH (ref 38–126)
Anion gap: 8 (ref 5–15)
BUN: 77 mg/dL — ABNORMAL HIGH (ref 8–23)
CO2: 20 mmol/L — ABNORMAL LOW (ref 22–32)
Calcium: 9.7 mg/dL (ref 8.9–10.3)
Chloride: 106 mmol/L (ref 98–111)
Creatinine, Ser: 1.9 mg/dL — ABNORMAL HIGH (ref 0.61–1.24)
GFR, Estimated: 37 mL/min — ABNORMAL LOW (ref >60)
Glucose, Bld: 95 mg/dL (ref 70–99)
Potassium: 5.3 mmol/L — ABNORMAL HIGH (ref 3.5–5.1)
Sodium: 134 mmol/L — ABNORMAL LOW (ref 135–145)
Total Bilirubin: 0.7 mg/dL (ref 0.3–1.2)
Total Protein: 7.3 g/dL (ref 6.5–8.1)

## 2021-06-20 LAB — CBC
HCT: 37.8 % — ABNORMAL LOW (ref 39.0–52.0)
Hemoglobin: 11.6 g/dL — ABNORMAL LOW (ref 13.0–17.0)
MCH: 28 pg (ref 26.0–34.0)
MCHC: 30.7 g/dL (ref 30.0–36.0)
MCV: 91.1 fL (ref 80.0–100.0)
Platelets: 143 10*3/uL — ABNORMAL LOW (ref 150–400)
RBC: 4.15 MIL/uL — ABNORMAL LOW (ref 4.22–5.81)
RDW: 14.1 % (ref 11.5–15.5)
WBC: 6.9 10*3/uL (ref 4.0–10.5)
nRBC: 0 % (ref 0.0–0.2)

## 2021-06-20 LAB — BRAIN NATRIURETIC PEPTIDE: B Natriuretic Peptide: 261.4 pg/mL — ABNORMAL HIGH (ref 0.0–100.0)

## 2021-06-20 MED ORDER — FUROSEMIDE 40 MG PO TABS: 40.0000 mg | ORAL_TABLET | Freq: Every day | ORAL | 6 refills | Status: DC

## 2021-06-20 NOTE — Progress Notes (Signed)
Advanced Heart Failure Clinic Note   Date:  06/20/2021   ID:  [name], DOB 16-Nov-1945, MRN [id number]  Location: Home  Provider location: [facility] Type of Visit: Established patient  PCP:  Timothy Low, MD  Cardiologist:  None Primary HF: [name]  Chief Complaint: Heart Failure follow-up   History of Present Illness:  [name] is a 75 year old retired Engineer, production professor with a history of advanced multiple sclerosis, Type A aortic dissection 2000, right heart failure with severe TR and chronic AF referred by Timothy. Roxy Henry for further evaluation for possible TV repair.    He presented acutely on October 15, 1998 with acute type A aortic dissection. He underwent emergency surgical repair with hemi-arch replacement of the ascending thoracic aorta and resuspension of the native aortic valve. 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 Because of the patient's underlying multiple sclerosis with severe generalized weakness patient was not felt to be candidate for surgical intervention. He was referred to [facility] for a second opinion and again not felt to be candidate for elective surgery   Admitted in December 2014 with right heart failure including severe bilateral lower extremity edema and generalized anasarca. Diuresed well and was started on Coumadin for long-term anticoagulation because of chronic persistent atrial fibrillation.  Echo in 3/15 actually showed that tricuspid regurgitation looked more moderate; EF 55%, mild AI, aortic root at sinuses of valsalva 4.8 cm, moderate Timothy, mildly dilated RV moderate TR, PA systolic pressure 46 mmHg.   Seen by Timothy. Roxy Henry 04/2020 [name] and metolazone d/ced.    Here for FU.  Feels well.  Denies dyspnea or chest pain.  Still swimming 3x per week, other  nights uses stationary bike.  Playing chess.  Wearing compression hose daily.  Weight has been stable.  Lasix cut back to '40mg'$  daily from 80 two weeks ago due to renal function apparently BUN up to 80, he is not sure of cr level.    Echo today 01/05/20 EF 55-60% RV mildly dilated with normal function. AoRoot 5.8 cm mild AI  Mild to moderate TR   Echo 4/18  LVEF 60-65% RV dilated with normal function. Moderate to severe TR. AoRoot dilated 6.0 cm with mild AI  Echo 1/20 LVEF 55-60% moderate Timothy. RV severely dilated. Function normal severe TR. AoRoot dilated 6.0 cm with mild AI   Past Medical History:  Diagnosis Date   Anemia    Aneurysm of aortic arch 04/04/2012   Chronic aneurysmal dilatation of aortic arch with chronic type A aortic dissection, s/p replacement of ascending thoracic aorta   Aortic dissection ([place]) 11/05/1998   S/P emergency repair of acute type A aortic dissection with resuspension of native aortic valve   Aortic dissection, thoracic    Aortic root aneurysm    Atrial fibrillation ([place]) 01/30/2013   Atrial fibrillation    Bilateral lower extremity edema    BPH (benign prostatic hyperplasia)    CHF (congestive heart failure) ([place]) 1945-10-07   2D Echo - EF 50-55%, mild-moderately dilated right ventricle, mild-moderate tricuspid valve regurgitation, moderately dilated right atrium   Depression    Dyslipidemia    Exertional shortness of breath    "for awhile now" (09/11/2013)   Hemorrhoids    History of blood transfusion    Hypertension    Mitral regurgitation 02/20/2013  Multiple sclerosis (HCC)    Neuromuscular disorder (HCC)    Pleural effusion, right, large 01/30/2013   Severe tricuspid valve regurgitation 01/30/2013   Severe tricuspid regurg by recent 2-D echo with a dilated tricuspid annulus, moderate pulmonary hypertension and biatrial enlargement    Thrombocytopenia [facility])    Past Surgical History:  Procedure Laterality Date   HEMORRHOID SURGERY  2009   Internal,  external hemorrhoidectomy, general anesthesia,prone position. [Other]   Open reduction and internal fixation of right distal radius fracture using [employer] distal radius volar locking plate.  07/2005   [name]   Repair of acute type A aortic dissection with resuspension of native aortic valve  10/15/1998   Timothy [name]   TEE WITHOUT CARDIOVERSION N/A 02/17/2013   Procedure: TRANSESOPHAGEAL ECHOCARDIOGRAM (TEE);  Surgeon: Timothy Klein, MD;  Location: [facility] ENDOSCOPY;  Service: Cardiovascular;  Laterality: N/A;     Current Outpatient Medications  Medication Sig Dispense Refill   baclofen (LIORESAL) 10 MG tablet Take 10 mg by mouth at bedtime.     Cholecalciferol (VITAMIN D3) 2000 UNITS TABS Take 1 capsule by mouth daily.     COPAXONE 40 MG/ML SOSY INJECT ONE SYRINGE SUBCUTANEOUSLY 3 TIMES A WEEK AT LEAST 48 HOURS APART. ALLOW TO WARM TO ROOM TEMP FOR 20 MINUTES. REFRIGERATE. 36 mL 2   dalfampridine 10 MG TB12 TAKE ONE TABLET BY MOUTH TWICE DAILY (APPROXIMATELY 12 HOURS APART). MAY BE TAKEN WITH OR WITHOUT FOOD. DO NOT CUT OR CRUSH. STORE AT ROOM TEMPERATURE. 180 tablet 2   ELIQUIS 5 MG TABS tablet TAKE 1 TABLET BY MOUTH TWICE DAILY. 120 tablet 0   ferrous sulfate 325 (65 FE) MG tablet Take 325 mg by mouth 2 (two) times daily with a meal.     folic acid (FOLVITE) 1 MG tablet Take 1 mg by mouth daily.     furosemide (LASIX) 40 MG tablet Take 40 mg by mouth daily.     losartan (COZAAR) 100 MG tablet Take 1 tablet (100 mg total) by mouth daily. 60 tablet 3   Multiple Vitamin (MULTIVITAMIN WITH MINERALS) TABS tablet Take 1 tablet by mouth daily. Centrum for men.     QUEtiapine (SEROQUEL) 25 MG tablet Take 25 mg by mouth at bedtime as needed.     No current facility-administered medications for this encounter.    Allergies:   Patient has no known allergies.   Social History:  The patient  reports that he has never smoked. He has never used smokeless tobacco. He reports current alcohol use.  He reports that he does not use drugs.   Family History:  The patient's family history includes Emphysema in his brother; Heart attack in his mother; Lung cancer in his brother; Thyroid cancer in his father.   ROS:  Please see the history of present illness.   All other systems are personally reviewed and negative.   Vitals:   06/20/21 1455  BP: 122/80  Pulse: 73  SpO2: 100%  Weight: 81.8 kg (180 lb 6.4 oz)   Wt Readings from Last 3 Encounters:  06/20/21 81.8 kg (180 lb 6.4 oz)  05/27/21 76.7 kg (169 lb)  09/23/20 75.3 kg (166 lb)   Exam:   General:  Well appearing. No resp difficulty HEENT: normal Neck: supple. JVD 9-10, prominent C-V wave. Carotids 2+ bilat; no bruits. No lymphadenopathy or thryomegaly appreciated. Cor: PMI nondisplaced. Irregular rate & rhythm. 2/6 TR Lungs: clear Abdomen: soft, nontender, increase in abdominal girth. No hepatosplenomegaly. No bruits or  masses. Good bowel sounds. Extremities: no cyanosis, clubbing, rash, edema brace on right ankle Neuro: alert & orientedx3, cranial nerves grossly intact. moves all 4 extremities w/o difficulty. Affect pleasant   Recent Labs: 09/23/2020: Hemoglobin 12.2; Platelets 135  Personally reviewed   Wt Readings from Last 3 Encounters:  06/20/21 81.8 kg (180 lb 6.4 oz)  05/27/21 76.7 kg (169 lb)  09/23/20 75.3 kg (166 lb)    Ecg: Afib rate 80, LBBB  ASSESSMENT AND PLAN:  1. Right heart failure with severe TR  - Last echo 4/18 LV/RV function stable. TR remains moderate to severe - Echo 1/20 LVEF 55-60% moderate Timothy. RV severely dilated. Function normal severe TR. AoRoot dilated 6.0 cm with mild AI - Echo today (01/04/20) EF 55-60% RV mildly dilated with mildly reduced function. AoRoot 5.8 cm mild AI  Mild to moderate TR - NYHA II (despite MS limitations). Weight is up a bit and starting to retain fluid - Continue lasix '40mg'$  daily but increase to '80mg'$  on Monday and Fridays - Intolerant carvedilol - He is not a  candidate for a tricuspid valve operation due to MS  2. Aortic dissection:  - s/p hemi-arch replacement of the ascending thoracic aorta and resuspension of the native aortic valve 2000 - now with chronic dissection and aneurysm involving sinus of Valsalva and aortic arch - CTA 4/18 was stable. Continue to follow with TCTS - Intolerant bb.  - echo 1/20 AoRoot stable at 6.0 cm - echo 4/21 AoRoot stable at 5.8cm. not candidate for surgery 3. HTN  -  Blood pressure well controlled. Continue current regimen. 4. Chronic AF:  - Rate controlled. Continue Eliquis - Denies bleeding 5. Severe multiple sclerosis - continue to follow with Neuro  6. CKD 4 -apparently renal fx sig worse 2 weeks ago with BUN up to 80 (doesn't remember cr) and lasix decreased -repeat labs today -referral to nephrology  Signed, Katherine Roan, MD  06/20/2021 3:04 PM  [facility] [address] [facility] [address] [place] [address] [phone] (office) [phone] (fax)   Patient seen and examined with the above-signed Advanced Practice Provider and/or Housestaff. I personally reviewed laboratory data, imaging studies and relevant notes. I independently examined the patient and formulated the important aspects of the plan. I have edited the note to reflect any of my changes or salient points. I have personally discussed the plan with the patient and/or family.  Lasix recently cut back by PCP due to worsening renal function. Weight trending up slowly. Still swimming regularly but feels he is not able to do as mucha s he could previously  General:  Sitting in chair  No resp difficulty HEENT: normal Neck: supple. JVP 9 + prominent v waves Carotids 2+ bilat; no bruits. No lymphadenopathy or thryomegaly appreciated. Cor: PMI nondisplaced. Regular rate & rhythm. 2/6 TR Lungs: clear Abdomen: soft, nontender, + distended. No hepatosplenomegaly. No bruits or masses.  Good bowel sounds. Extremities: no cyanosis, clubbing, rash, trace edema Neuro: alert & orientedx3, cranial nerves grossly intact. moves all 4 extremities w/o difficulty. Affect pleasant  He has mild volume overload in setting of recent decrease in lasix dosing due to worsening renal function. Will have him double lasix on Monday and Friday to see if this strikes a balance. Check labs today. We discussed likelihood of possible need for HD if renal function continues to deteriorate. Not sure if he would tolerate. Repeat echo at next visit. Doubt he would qualify for  percutaneous TV trials.   Glori Bickers, MD  12:02 AM

## 2021-06-20 NOTE — Patient Instructions (Signed)
Take Furosemide 80 mg (2 tabs) on Mondays and Friday, all other days take 40 mg (1 tab) daily  Labs done today, your results will be available in MyChart, we will contact you for abnormal readings.  You have been referred to [facility], they will call you with an appointment  Your physician recommends that you schedule a follow-up appointment in: 6 months with an echocardiogram (April 2023), **PLEASE CALL OUR OFFICE IN February TO SCHEDULE THESE APPOINTMENTS  If you have any questions or concerns before your next appointment please send [place] a message through [place] or call our office at [phone].    TO LEAVE A MESSAGE FOR THE NURSE SELECT OPTION 2, PLEASE LEAVE A MESSAGE INCLUDING: YOUR NAME DATE OF BIRTH CALL BACK NUMBER REASON FOR CALL**this is important as we prioritize the call backs  YOU WILL RECEIVE A CALL BACK THE SAME DAY AS LONG AS YOU CALL BEFORE 4:00 PM  At the [facility], you and your health needs are our priority. As part of our continuing mission to provide you with exceptional heart care, we have created designated Provider Care Teams. These Care Teams include your primary Cardiologist (physician) and Advanced Practice Providers (APPs- Physician Assistants and Nurse Practitioners) who all work together to provide you with the care you need, when you need it.   You may see any of the following providers on your designated Care Team at your next follow up: Dr Glori Bickers Dr Loralie Champagne Dr Patrice Paradise, NP Lyda Jester, [place] Ginnie Smart Audry Riles, PharmD   Please be sure to bring in all your medications bottles to every appointment.

## 2021-06-21 ENCOUNTER — Other Ambulatory Visit (HOSPITAL_COMMUNITY): Payer: Self-pay | Admitting: Internal Medicine

## 2021-07-15 ENCOUNTER — Telehealth (HOSPITAL_COMMUNITY): Payer: Self-pay

## 2021-07-15 DIAGNOSIS — I5032 Chronic diastolic (congestive) heart failure: Secondary | ICD-10-CM

## 2021-07-15 NOTE — Telephone Encounter (Signed)
-----   Message from Conrad Condon, NP sent at 07/14/2021  8:48 AM EDT ----- Please set up for repeat BMET. K running a little high.

## 2021-07-15 NOTE — Telephone Encounter (Signed)
Patient advised will repeat blood work next Wednesday,lab order entered, lab appt scheduled  Orders Placed This Encounter  Procedures   Basic metabolic panel    Standing Status:   Future    Standing Expiration Date:   07/15/2022    Order Specific Question:   Release to patient    Answer:   Immediate   .

## 2021-07-23 ENCOUNTER — Ambulatory Visit (HOSPITAL_COMMUNITY)
Admission: RE | Admit: 2021-07-23 | Discharge: 2021-07-23 | Disposition: A | Discharge status: Home / Self Care | Payer: Medicare Other | Source: Ambulatory Visit | Attending: Internal Medicine | Admitting: Internal Medicine

## 2021-07-23 ENCOUNTER — Other Ambulatory Visit: Payer: Self-pay

## 2021-07-23 DIAGNOSIS — I5032 Chronic diastolic (congestive) heart failure: Secondary | ICD-10-CM | POA: Diagnosis present

## 2021-07-23 LAB — BASIC METABOLIC PANEL
Anion gap: 11 (ref 5–15)
BUN: 86 mg/dL — ABNORMAL HIGH (ref 8–23)
CO2: 19 mmol/L — ABNORMAL LOW (ref 22–32)
Calcium: 9.2 mg/dL (ref 8.9–10.3)
Chloride: 106 mmol/L (ref 98–111)
Creatinine, Ser: 2.03 mg/dL — ABNORMAL HIGH (ref 0.61–1.24)
GFR, Estimated: 34 mL/min — ABNORMAL LOW (ref >60)
Glucose, Bld: 98 mg/dL (ref 70–99)
Potassium: 4.8 mmol/L (ref 3.5–5.1)
Sodium: 136 mmol/L (ref 135–145)

## 2021-07-31 ENCOUNTER — Telehealth (HOSPITAL_COMMUNITY): Payer: Self-pay

## 2021-07-31 NOTE — Telephone Encounter (Signed)
Referral to [facility] succesfully faxed to [phone]

## 2021-08-15 NOTE — Telephone Encounter (Signed)
Received fax from [facility], pt is sch to be seen there on 08/19/21 at 4 pm

## 2021-08-20 ENCOUNTER — Other Ambulatory Visit: Payer: Self-pay | Admitting: Nephrology

## 2021-08-20 DIAGNOSIS — N1832 Chronic kidney disease, stage 3b: Secondary | ICD-10-CM

## 2021-08-20 DIAGNOSIS — I129 Hypertensive chronic kidney disease with stage 1 through stage 4 chronic kidney disease, or unspecified chronic kidney disease: Secondary | ICD-10-CM

## 2021-08-24 ENCOUNTER — Other Ambulatory Visit: Payer: Self-pay

## 2021-08-24 ENCOUNTER — Emergency Department (HOSPITAL_COMMUNITY)
Admission: EM | Admit: 2021-08-24 | Discharge: 2021-08-24 | Disposition: A | Discharge status: Home / Self Care | Payer: Medicare Other | Attending: Emergency Medicine | Admitting: Emergency Medicine

## 2021-08-24 DIAGNOSIS — Z5321 Procedure and treatment not carried out due to patient leaving prior to being seen by health care provider: Secondary | ICD-10-CM | POA: Insufficient documentation

## 2021-08-24 DIAGNOSIS — T169XXA Foreign body in ear, unspecified ear, initial encounter: Secondary | ICD-10-CM | POA: Insufficient documentation

## 2021-08-24 DIAGNOSIS — X58XXXA Exposure to other specified factors, initial encounter: Secondary | ICD-10-CM | POA: Insufficient documentation

## 2021-08-24 NOTE — ED Notes (Signed)
Pt reports he is leaving and going to follow-up with primary care prior to triage.

## 2021-09-02 ENCOUNTER — Other Ambulatory Visit (HOSPITAL_COMMUNITY): Payer: Self-pay | Admitting: *Deleted

## 2021-09-02 ENCOUNTER — Other Ambulatory Visit: Payer: Self-pay | Admitting: Diagnostic Neuroimaging

## 2021-09-02 DIAGNOSIS — G35 Multiple sclerosis: Secondary | ICD-10-CM

## 2021-09-02 DIAGNOSIS — R269 Unspecified abnormalities of gait and mobility: Secondary | ICD-10-CM

## 2021-09-02 MED ORDER — FUROSEMIDE 40 MG PO TABS: 40.0000 mg | ORAL_TABLET | Freq: Every day | ORAL | 6 refills | Status: DC

## 2021-09-03 ENCOUNTER — Ambulatory Visit
Admission: RE | Admit: 2021-09-03 | Discharge: 2021-09-03 | Disposition: A | Discharge status: Home / Self Care | Payer: Medicare Other | Source: Ambulatory Visit | Attending: Nephrology | Admitting: Nephrology

## 2021-09-03 DIAGNOSIS — N1832 Chronic kidney disease, stage 3b: Secondary | ICD-10-CM

## 2021-09-03 DIAGNOSIS — I129 Hypertensive chronic kidney disease with stage 1 through stage 4 chronic kidney disease, or unspecified chronic kidney disease: Secondary | ICD-10-CM

## 2021-09-05 NOTE — Telephone Encounter (Signed)
Encounter open in error 

## 2021-09-29 ENCOUNTER — Ambulatory Visit (INDEPENDENT_AMBULATORY_CARE_PROVIDER_SITE_OTHER): Payer: Medicare Other | Admitting: Diagnostic Neuroimaging

## 2021-09-29 ENCOUNTER — Encounter: Payer: Self-pay | Admitting: Diagnostic Neuroimaging

## 2021-09-29 VITALS — BP 125/71 | HR 71

## 2021-09-29 DIAGNOSIS — R269 Unspecified abnormalities of gait and mobility: Secondary | ICD-10-CM | POA: Diagnosis not present

## 2021-09-29 DIAGNOSIS — G35 Multiple sclerosis: Secondary | ICD-10-CM

## 2021-09-29 NOTE — Progress Notes (Signed)
PATIENT: [name] DOB: 04-Jul-1946   REASON FOR VISIT: follow up for MS HISTORY FROM: patient  Chief Complaint  Patient presents with   Follow-up    RM 7 with spouse Debra Pt states MS has progressed and worsen since last visit. He uses a wheelchair to get around most of the time, if not a rolling walker     HISTORY OF PRESENT ILLNESS:  UPDATE (09/29/21, VRP): Since last visit, symptoms and dz are progressing. More gait and balance issues.  UPDATE (09/23/20, VRP): Since last visit, doing slightly worse with MS symptoms, fatigue, memory weakness. Symptoms are progressive. Severity is moderate. No alleviating or aggravating factors.   UPDATE (12/18/19, VRP): Since last visit, doing well. Symptoms are stable. Still working on right knee issues. Has stationary bike at home now. No alleviating or aggravating factors. Tolerating meds. Playing chess a lot nowadays.   UPDATE (09/18/19, VRP): Since last visit, doing well until 2 weeks ago (mild cold sxs x 1 day; then generalized weakness). Right knee locked up; saw ortho and it was drained and injected --> now better.   UPDATE (04/18/19, VRP): Since last visit, doing WELL. Symptoms are stable / improved slightly. No alleviating or aggravating factors. Tolerating copaxone.  Now back to swimming exercises.  UPDATE (10/05/18, VRP): Since last visit, doing about the same. Symptoms are stable. Patient wants to consider staying on tecfidera, in spite of low lymphocyte count and risk of PML. No alleviating or aggravating factors.  UPDATE (08/22/18, VRP): Since last visit, symptoms are slightly progressed (gait issues). No alleviating or aggravating factors. Tolerating tecfidera and ampyra. Staying active with swimming.  UPDATE (08/18/17, VRP): Since last visit, tolerating meds. No alleviating or aggravating factors. Ampyra helping somewhat. Having to lean on walker more.   UPDATE 08/18/16: Since last visit, tolerating meds. No new issues. Some  slight subjective progression of symptoms. Had a fall recently with subsequent right forearm bruising.   UPDATE 02/11/16: Since last visit, tolerating tecfidera. Some slow progression. Asking about new MS medications.  UPDATE 08/13/15: Tolerating meds. More urinary issues. Using walker mainly.   UPDATE 04/16/15: Since last visit, patient is stable. Getting home visits with palliative care.   UPDATE 02/05/15: Discussed palliative care consult and options.   UPDATE 08/02/14: Since last visit, feels stable. Tolerating tecfidera and ampyra. No new events. Uses cane and walker.  Swimming more. Wife notes memory issues slightly worse.   UPDATE 01/31/14: Since last visit, continues on tecfidera. No progression of neuro sxs or MS sxs. Asking about driving again. Has not driven in last 3-4 months. Ongoing medical mgmt of his cardiac issues, and he is not a surgical candidate.  UPDATE 10/25/13 (LL): Patient comes in for revisit, since last visit he had worsening right-sided HF and was hospitalized.  He was seen by [name] for surgical consultation to consider tricuspid valve repair and possible Maze procedure.  He was referred to the advanced heart failure clinic who has been adjusting his lasix to manage his lower extremity edema and shortness of breath.  He is to be seen by [name] again for follow up in another month, and says [name] felt that surgery was needed.  He comes in today to discuss with [name] how his MS would be affected by having the surgery.  His wife feels like he is having more difficulty walking since last visit, but he has not had any falls.  He is tolerating Tecfidera and Ampyra well.  UPDATE  07/19/13 (VP): Patient continues to have progressive deterioration in cognitive ability, memory, mood lability, gait stability. Patient is on tecfidera now, and tolerating without side effects. Patient feels when he came off of Betaseron he had some accelerated progression of MS that this  has slowed down since starting tecfidera. Unclear whether patient's current level of functioning is on the same trajectory as the decline that was noted while patient was on Betaseron, or whether there has been some acceleration of deterioration. Other factors include some marital discord and comorbid depression. Other contributing factors include his cardiac issues including atrial fibrillation, on Coumadin, as well as severe tricuspid valve regurgitation leading to peripheral edema. Unfortunately patient is not felt to be a good surgical candidate.   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 The patient is accompanied by his wife. Pt. States he is doing worse since his last visit in Feb. He is walking slower and having more difficulty standing from a seated position. 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 [name] had talked to him about changing MS medications and he thinks he wants to. Having more memory problems. Continuing on betaseron right now.   PRIOR HPI (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 with a history of multiple sclerosis diagnosed in September 1996. He has been on Betaseron since 1996 and has right leg spasticity requiring him to use a cane in his left hand.He does not have any significant side effects from the Betaseron. Blood studies 02/10/2012 are normal CBC and CMP except for a low platelet count of 63K, Hgb 12.1 and alk Phos of 253. This is being followed by [name] and [name]. He has never had lymphopenia or neutropenia. There is no change in his symptoms. He denies bowel or bladder incontinence, weakness, Lhermitte's sign, or double vision. He swims 3 times per week for 10 laps which is a little over a quarter of a mile, claims he is winded afterwards. He has not fallen, uses a cane in his left  hand. MRI of the brain and cervical spine with and without contrast enhancement 03/26/2010 showed multiple subcortical, periventricular, and brainstem white matter hyperintensities without enhancing lesions present. There were T1 black holes and atrophy present. The cervical MRI showed a large central disc protrusion at C5-6 and spinal cord hyperintensities at C4 and brain stem representing remote demyelinating plaques without enhancing lesions present. Ampyra was started November 2012 with improvement in gait. He was pleased with the addition of the drug and his response. Pt denies injection site problems however he says he is tired of shots. He has been on [employer] since 1996 and we have talked about whether changing to an oral agent should be considered. He had several falls with his right leg giving way on standing. He has right knee pain. He states his memory is worse. He denies numbness delusions or depression. 07/11/2012= (MMSE29/30. Clock drawing task4/4. Animal fluency test 17). His wife has noted more problems walking which the patient is hesitant to discuss. He does admit since calling me for a work in appointment , that he believes he is having more difficulty walking. He uses a walker at home and is noticeably slower. He uses the walker to get out of a bed that is low and out of a chair. He notices shortness of breath on swimming. His  swallowing is okay except for occasional difficulty with liquids. He has right knee pain when walking. Betaseron seems to help his knee pain, walking, and his eyes after an injection. He is hesitant to go off of the medication for 3 weeks. He has increased spasms in his right foot and leg that are also moving to the left leg. He has swelling in his legs treated with furosemide.    REVIEW OF SYSTEMS: Full 14 system review of systems performed and negative except: as per HPI.    ALLERGIES: No Known Allergies  HOME MEDICATIONS: Outpatient Medications Prior to Visit   Medication Sig Dispense Refill   apixaban (ELIQUIS) 5 MG TABS tablet Take 1 tablet (5 mg total) by mouth 2 (two) times daily. 180 tablet 3   baclofen (LIORESAL) 10 MG tablet Take 10 mg by mouth at bedtime.     Cholecalciferol (VITAMIN D3) 2000 UNITS TABS Take 1 capsule by mouth daily.     COPAXONE 40 MG/ML SOSY INJECT ONE SYRINGE SUBCUTANEOUSLY 3 TIMES A WEEK AT LEAST 48 HOURS APART. ALLOW TO WARM TO ROOM TEMP FOR 20 MINUTES. REFRIGERATE. 36 mL 2   dalfampridine 10 MG TB12 TAKE 1 TABLET BY MOUTH 2 TIMES A DAY (APPROXIMATELY 12 HOURS APART). MAY BE TAKEN WITH OR WITHOUT FOOD. DO NOT CUT OR CRUSH. STORE AT ROOM TEMPERATURE. 180 tablet 1   ferrous sulfate 325 (65 FE) MG tablet Take 325 mg by mouth 2 (two) times daily with a meal.     folic acid (FOLVITE) 1 MG tablet Take 1 mg by mouth daily.     furosemide (LASIX) 40 MG tablet Take 1 tablet (40 mg total) by mouth daily. Except on Mondays and Fridays take 80 mg daily 60 tablet 6   losartan (COZAAR) 100 MG tablet Take 1 tablet (100 mg total) by mouth daily. 60 tablet 3   Multiple Vitamin (MULTIVITAMIN WITH MINERALS) TABS tablet Take 1 tablet by mouth daily. Centrum for men.     QUEtiapine (SEROQUEL) 25 MG tablet Take 25 mg by mouth at bedtime as needed.     No facility-administered medications prior to visit.    PAST MEDICAL HISTORY: Past Medical History:  Diagnosis Date   Anemia    Aneurysm of aortic arch 04/04/2012   Chronic aneurysmal dilatation of aortic arch with chronic type A aortic dissection, s/p replacement of ascending thoracic aorta   Aortic dissection ([place]) 11/05/1998   S/P emergency repair of acute type A aortic dissection with resuspension of native aortic valve   Aortic dissection, thoracic    Aortic root aneurysm    Atrial fibrillation ([place]) 01/30/2013   Atrial fibrillation    Bilateral lower extremity edema    BPH (benign prostatic hyperplasia)    CHF (congestive heart failure) ([place]) March 12, 1946   2D Echo - EF 50-55%,  mild-moderately dilated right ventricle, mild-moderate tricuspid valve regurgitation, moderately dilated right atrium   Depression    Dyslipidemia    Exertional shortness of breath    "for awhile now" (09/11/2013)   Hemorrhoids    History of blood transfusion    Hypertension    Mitral regurgitation 02/20/2013   Multiple sclerosis (H[facility])    Neuromuscular disorder (H[facility])    Pleural effusion, right, large 01/30/2013   Severe tricuspid valve regurgitation 01/30/2013   Severe tricuspid regurg by recent 2-D echo with a dilated tricuspid annulus, moderate pulmonary hypertension and biatrial enlargement    Thrombocytopenia ([place])     PAST SURGICAL HISTORY: Past Surgical History:  Procedure Laterality Date   HEMORRHOID SURGERY  2009   Internal, external hemorrhoidectomy, general anesthesia,prone position. [Other]   Open reduction and internal fixation of right distal radius fracture using [employer] distal radius volar locking plate.  07/2005   Dr Ninfa Linden   Repair of acute type A aortic dissection with resuspension of native aortic valve  10/15/1998   Dr Roxy Manns   TEE WITHOUT CARDIOVERSION N/A 02/17/2013   Procedure: TRANSESOPHAGEAL ECHOCARDIOGRAM (TEE);  Surgeon: [name], MD;  Location: [facility] ENDOSCOPY;  Service: Cardiovascular;  Laterality: N/A;    FAMILY HISTORY: Family History  Problem Relation Age of Onset   Thyroid cancer Father        smoked   Heart attack Mother    Lung cancer Brother        smoked   Emphysema Brother        smoked    SOCIAL HISTORY: Social History   Socioeconomic History   Marital status: Married    Spouse name: Debbie   Number of children: 2   Years of education: Ph.D   Highest education level: Not on file  Occupational History   Occupation: Retired Professor    Fish farm manager: [employer]    Comment: Statistics  Tobacco Use   Smoking status: Never   Smokeless tobacco: Never  Substance and Sexual Activity   Alcohol use: Yes    Comment:  09/11/2013 "might have a drink once/month"   Drug use: No   Sexual activity: Not Currently  Other Topics Concern   Not on file  Social History Narrative   Patient lives at home with spouse.    Caffeine Use: eat a lot of chocolate, sodas, coffee and tea occasionally   Right Handed   Social Determinants of Health   Financial Resource Strain: Not on file  Food Insecurity: Not on file  Transportation Needs: Not on file  Physical Activity: Not on file  Stress: Not on file  Social Connections: Not on file  Intimate Partner Violence: Not on file     PHYSICAL EXAM  Vitals:   09/29/21 1454  BP: 125/71  Pulse: 71   There is no height or weight on file to calculate BMI.  Generalized: Well developed, in no acute distress  Neck: Supple, no carotid bruits  Cardiac: IRREG RATE RHYTHM, SYS MURMUR  Neurological examination   MENTAL STATUS: awake, alert, language fluent, comprehension intact, naming intact   CRANIAL NERVE: pupils equal and reactive to light, visual fields full to confrontation, extraocular muscles: SACCADIC BREAKDOWN OF SMOOTH PURSUIT; END GAZE NYSTAGMUS; PAST POINTING ON SACCADES. Facial sensation and WITH DECR RIGHT EYE CLOSURE WEAKNESS AND RIGHT LOWER FACIAL STRENGTH. SYNKINESES ON RIGHT FACE. Uvula midline, shoulder shrug symmetric, tongue midline.   MOTOR: INCREASED TONE IN BLE > BUE (RIGHT > LEFT). BUE (DELTOID 4, TRICEP 4, BICEP 5, GRIP 4+), RIGHT LEG (HF 2, KE 3, KF 2, DF 1-2), LEFT LEG (HF 3, KE/KF 3, DF 3)  SENSORY: DECR IN RLE TO ALL MODALITIES.   COORDINATION: finger-nose-finger, fine finger movements normal   REFLEXES: BUE 3 (R>L), RLE 3, LLE 2.   GAIT/STATION: using rollator   DIAGNOSTIC DATA (LABS, IMAGING, TESTING) - I reviewed patient records, labs, notes, testing and imaging myself where available.  Lab Results  Component Value Date   WBC 6.9 06/20/2021   HGB 11.6 (L) 06/20/2021   HCT 37.8 (L) 06/20/2021   MCV 91.1 06/20/2021   PLT 143 (L)  06/20/2021   CMP Latest  Ref Rng & Units 07/23/2021 06/20/2021 03/01/2020  Glucose 70 - 99 mg/dL 98 95 104(H)  BUN 8 - 23 mg/dL 86(H) 77(H) 78(H)  Creatinine 0.61 - 1.24 mg/dL 2.03(H) 1.90(H) 2.43(H)  Sodium 135 - 145 mmol/L 136 134(L) 138  Potassium 3.5 - 5.1 mmol/L 4.8 5.3(H) 4.8  Chloride 98 - 111 mmol/L 106 106 106  CO2 22 - 32 mmol/L 19(L) 20(L) 22  Calcium 8.9 - 10.3 mg/dL 9.2 9.7 9.3  Total Protein 6.5 - 8.1 g/dL - 7.3 7.0  Total Bilirubin 0.3 - 1.2 mg/dL - 0.7 0.6  Alkaline Phos 38 - 126 U/L - 166(H) 138(H)  AST 15 - 41 U/L - 30 22  ALT 0 - 44 U/L - 39 27   lymph#  Date Value Ref Range Status  02/10/2012 0.9 0.9 - 3.3 10e3/uL Final  08/10/2011 1.1 0.9 - 3.3 10e3/uL Final  03/12/2010 0.8 (L) 0.9 - 3.3 10e3/uL Final   Lymphocytes Absolute  Date Value Ref Range Status  09/23/2020 0.5 (L) 0.7 - 3.1 x10E3/uL Final  04/18/2019 0.3 (L) 0.7 - 3.1 x10E3/uL Final  01/02/2019 0.4 (L) 0.7 - 3.1 x10E3/uL Final    11/16/12 MRI brain - There are multiple periventricular and subcortical and pontine chronic demyelinating plaques. There are several T1 black holes. Mild diffuse atrophy. No acute plaques. In comparison to MRI from 03/26/10, there is no significant change.   08/02/13 MRI brain - multiple brainstem, periventricular, corpus callosal and subcortical white matter hyperintensities compatible with chronic lesions of multiple sclerosis. No enhancing lesions are noted. The presence of T1 black holes and significant atrophy of corpus callosum and cortex indicates chronic disease.  11/16/12 MRI cervical spine - Multiple chronic demyelinating plaques at C2-3, C3-4 and C5-6. No acute plaques. At C5-6: Disc bulging with facet hypertrophy with moderate spinal stenosis, severe right and moderate left foraminal stenosis. At C3-4, C4-5: Disc bulging with mild spinal stenosis and biforaminal stenosis. In comparison to MRI from 03/26/10 there is no significant change.   01/20/13 JCV antibody - positive,  index 3.37 (H)    ASSESSMENT AND PLAN  76 y.o. old male with Hypertension; Anemia; Depression; Multiple sclerosis; Thrombocytopenia; Dyslipidemia; Aortic dissection, thoracic; Aortic dissection (11/05/1998); Aneurysm of aortic arch (04/04/2012); Chest pain (11/18/2009); CHF (congestive heart failure), Atrial fibrillation (01/30/2013); Pleural effusion, right, large (01/30/2013); Severe tricuspid valve regurgitation (01/30/2013); Mitral regurgitation (02/20/2013); and Lower extremity edema (02/20/2013) here with multiple sclerosis since 1996. Initially on betaseron, now on tecfidera.  Dx:  1. MS (multiple sclerosis) ([place])   2. Abnormality of gait and mobility      PLAN:  MULTIPLE SCLEROSIS - continue copaxone - continue ampyra - continue baclofen 30m at bedtime - continue swimming and PT exercises - high fall risk and on anti-coagulation (eliquis) for atrial fibrillation; precautions reviewed with patient  Return in about 1 year (around 09/29/2022).     VPenni Bombard MD 17/48/2707 38:67PM Certified in Neurology, Neurophysiology and Neuroimaging  [facility] [address] [place] [phone]

## 2021-12-03 ENCOUNTER — Other Ambulatory Visit: Payer: Self-pay | Admitting: Diagnostic Neuroimaging

## 2022-01-01 ENCOUNTER — Telehealth: Payer: Self-pay

## 2022-01-01 ENCOUNTER — Ambulatory Visit (HOSPITAL_BASED_OUTPATIENT_CLINIC_OR_DEPARTMENT_OTHER): Payer: Medicare Other | Admitting: Orthopaedic Surgery

## 2022-01-01 NOTE — Telephone Encounter (Signed)
Received PA for Dalfampridine fax form to complete and send back [facility]. Form completed and placed on MD's desk for review.  ?

## 2022-01-05 NOTE — Telephone Encounter (Signed)
Notice of approval received via fax from 01/05/2022-01/06/2023.  ?

## 2022-01-05 NOTE — Telephone Encounter (Signed)
Dalfampridine PA form completed, signed and faxed to [place] with last office note. Received confirmation. ?

## 2022-02-02 ENCOUNTER — Other Ambulatory Visit (HOSPITAL_COMMUNITY): Payer: Self-pay | Admitting: Internal Medicine

## 2022-02-26 ENCOUNTER — Encounter: Payer: Self-pay | Admitting: Diagnostic Neuroimaging

## 2022-04-03 ENCOUNTER — Other Ambulatory Visit (HOSPITAL_COMMUNITY): Payer: Self-pay | Admitting: Internal Medicine

## 2022-04-04 ENCOUNTER — Encounter (HOSPITAL_COMMUNITY): Payer: Self-pay | Admitting: Emergency Medicine

## 2022-04-04 ENCOUNTER — Other Ambulatory Visit: Payer: Self-pay

## 2022-04-04 ENCOUNTER — Emergency Department (HOSPITAL_COMMUNITY)
Admission: EM | Admit: 2022-04-04 | Discharge: 2022-04-04 | Disposition: A | Discharge status: Home / Self Care | Payer: Medicare Other | Attending: Emergency Medicine | Admitting: Emergency Medicine

## 2022-04-04 DIAGNOSIS — Z7901 Long term (current) use of anticoagulants: Secondary | ICD-10-CM | POA: Diagnosis not present

## 2022-04-04 DIAGNOSIS — U071 COVID-19: Secondary | ICD-10-CM | POA: Diagnosis present

## 2022-04-04 LAB — SARS CORONAVIRUS 2 BY RT PCR: SARS Coronavirus 2 by RT PCR: POSITIVE — AB

## 2022-04-04 NOTE — ED Provider Notes (Signed)
[facility] DEPT Provider Note   CSN: [id number] Arrival date & time: 04/04/22  1503     History  Chief Complaint  Patient presents with   Covid Exposure    [name] is a 76 y.o. male with a history of thrombocytopenia, MS, aortic dissection 20 years ago presenting today with concern for COVID-19.  Reports that his son tested positive this morning.  They were all on vacation together.  His wife has COVID symptoms.  He is asymptomatic.  HPI     Home Medications Prior to Admission medications   Medication Sig Start Date End Date Taking? Authorizing Provider  apixaban (ELIQUIS) 5 MG TABS tablet Take 1 tablet (5 mg total) by mouth 2 (two) times daily. 06/24/21   Bensimhon, Shaune Pascal, MD  baclofen (LIORESAL) 10 MG tablet Take 10 mg by mouth at bedtime.    [provider]  Cholecalciferol (VITAMIN D3) 2000 UNITS TABS Take 1 capsule by mouth daily.    [provider]  COPAXONE 40 MG/ML SOSY INJECT ONE SYRINGE SUBCUTANEOUSLY 3 TIMES A WEEK AT LEAST 48 HOURS APART. ALLOW TO WARM TO ROOM TEMP FOR 20 MINUTES. REFRIGERATE. 12/03/21   Penumalli, Earlean Polka, MD  dalfampridine 10 MG TB12 TAKE 1 TABLET BY MOUTH 2 TIMES A DAY (APPROXIMATELY 12 HOURS APART). MAY BE TAKEN WITH OR WITHOUT FOOD. DO NOT CUT OR CRUSH. STORE AT ROOM TEMPERATURE. 09/02/21   Penumalli, Earlean Polka, MD  ferrous sulfate 325 (65 FE) MG tablet Take 325 mg by mouth 2 (two) times daily with a meal.    [provider]  folic acid (FOLVITE) 1 MG tablet Take 1 mg by mouth daily.    [provider]  furosemide (LASIX) 40 MG tablet Take 1 tablet (40 mg total) by mouth daily. Except on Mondays and Fridays take 80 mg daily 09/02/21   Bensimhon, Shaune Pascal, MD  losartan (COZAAR) 100 MG tablet Take 1 tablet (100 mg total) by mouth daily. Please make follow-up appt for further refills. [phone] 04/03/22   Bensimhon, Shaune Pascal, MD  Multiple Vitamin (MULTIVITAMIN WITH  MINERALS) TABS tablet Take 1 tablet by mouth daily. Centrum for men.    [provider]  QUEtiapine (SEROQUEL) 25 MG tablet Take 25 mg by mouth at bedtime as needed.    [provider]      Allergies    Patient has no known allergies.    Review of Systems   Review of Systems  Physical Exam Updated Vital Signs BP (!) 111/55 (BP Location: Left Arm)   Pulse (!) 105   Temp 99 F (37.2 C) (Oral)   Resp 18   SpO2 99%  Physical Exam Vitals and nursing note reviewed.  Constitutional:      Appearance: Normal appearance. He is not ill-appearing.  HENT:     Head: Normocephalic and atraumatic.     Mouth/Throat:     Mouth: Mucous membranes are moist.     Pharynx: Oropharynx is clear.  Eyes:     General: No scleral icterus.    Conjunctiva/sclera: Conjunctivae normal.  Cardiovascular:     Rate and Rhythm: Normal rate and regular rhythm.  Pulmonary:     Effort: Pulmonary effort is normal. No respiratory distress.     Breath sounds: No wheezing or rales.     Comments: Lung sounds clear Skin:    General: Skin is warm and dry.     Findings: No rash.  Neurological:  Mental Status: He is alert.  Psychiatric:        Mood and Affect: Mood normal.     ED Results / Procedures / Treatments   Labs (all labs ordered are listed, but only abnormal results are displayed) Labs Reviewed - No data to display  EKG None  Radiology No results found.  Procedures Procedures   Medications Ordered in ED Medications - No data to display  ED Course/ Medical Decision Making/ A&P                           Medical Decision Making  76 year old male presenting with a COVID exposure.  Entire family is sick and 1 son tested positive for COVID.  He is asymptomatic.  COVID test: Positive  MDM/disposition: Patient is asymptomatic COVID.  PE WNL. Does have risk factors for poor COVID infection including previous aortic dissection, MS and CHF.  I reviewed his medications and  cross checked it with NIH database.  His Seroquel and Eliquis need to be titrated and adjusted in order to qualify for Paxlovid.  He is asymptomatic and I do not believe the risks of changing his medications are worth treating his mild COVID infection.  He will use over-the-counter remedies and return with any worsening or severe symptoms.  Final Clinical Impression(s) / ED Diagnoses Final diagnoses:  COVID-19    Rx / DC Orders   Results and diagnoses were explained to the patient. Return precautions discussed in full. Patient had no additional questions and expressed complete understanding.   This chart was dictated using voice recognition software.  Despite best efforts to proofread,  errors can occur which can change the documentation meaning.    Darliss Ridgel 04/04/22 1853    Pattricia Boss, MD 04/04/22 2240

## 2022-04-04 NOTE — Discharge Instructions (Addendum)
You have COVID-19.  Use over-the-counter medications such as Tylenol or ibuprofen for any fevers or pain.  You may use any flu medication if you develop other symptoms.

## 2022-04-04 NOTE — ED Triage Notes (Signed)
Patient BIB family with positive Covid exposure. He denies any symptoms. Requesting Covid test.

## 2022-04-28 ENCOUNTER — Telehealth: Payer: Self-pay | Admitting: *Deleted

## 2022-04-28 NOTE — Telephone Encounter (Signed)
Received [facility]one [facility]. Completed form placed on MD desk for review, signature.

## 2022-04-29 NOTE — Telephone Encounter (Signed)
Copaxone PA form signed, [facility]. Received confirmation.

## 2022-04-30 ENCOUNTER — Other Ambulatory Visit (HOSPITAL_COMMUNITY): Payer: Self-pay | Admitting: Internal Medicine

## 2022-04-30 NOTE — Telephone Encounter (Signed)
Copaxone approved 04/29/22-04/30/23.

## 2022-06-02 ENCOUNTER — Other Ambulatory Visit (HOSPITAL_COMMUNITY): Payer: Self-pay | Admitting: Internal Medicine

## 2022-06-02 ENCOUNTER — Other Ambulatory Visit: Payer: Self-pay | Admitting: Diagnostic Neuroimaging

## 2022-06-02 DIAGNOSIS — G35D Multiple sclerosis, unspecified: Secondary | ICD-10-CM

## 2022-06-02 DIAGNOSIS — R269 Unspecified abnormalities of gait and mobility: Secondary | ICD-10-CM

## 2022-06-02 DIAGNOSIS — G35 Multiple sclerosis: Secondary | ICD-10-CM

## 2022-06-04 ENCOUNTER — Telehealth (HOSPITAL_COMMUNITY): Payer: Self-pay | Admitting: Vascular Surgery

## 2022-06-04 MED ORDER — LOSARTAN POTASSIUM 100 MG PO TABS: 100.0000 mg | ORAL_TABLET | Freq: Every day | ORAL | 0 refills | Status: DC

## 2022-06-04 NOTE — Telephone Encounter (Signed)
Rx sent 

## 2022-06-04 NOTE — Telephone Encounter (Signed)
Refill Losartan pt made appt for Oct

## 2022-06-12 ENCOUNTER — Other Ambulatory Visit (HOSPITAL_COMMUNITY): Payer: Self-pay | Admitting: Internal Medicine

## 2022-06-25 ENCOUNTER — Ambulatory Visit (HOSPITAL_COMMUNITY)
Admission: RE | Admit: 2022-06-25 | Discharge: 2022-06-25 | Disposition: A | Discharge status: Home / Self Care | Payer: Medicare Other | Source: Ambulatory Visit | Attending: Internal Medicine | Admitting: Internal Medicine

## 2022-06-25 ENCOUNTER — Encounter (HOSPITAL_COMMUNITY): Payer: Self-pay | Admitting: Internal Medicine

## 2022-06-25 VITALS — BP 118/70 | HR 61

## 2022-06-25 DIAGNOSIS — I081 Rheumatic disorders of both mitral and tricuspid valves: Secondary | ICD-10-CM | POA: Diagnosis not present

## 2022-06-25 DIAGNOSIS — I7122 Aneurysm of the aortic arch, without rupture: Secondary | ICD-10-CM | POA: Insufficient documentation

## 2022-06-25 DIAGNOSIS — N184 Chronic kidney disease, stage 4 (severe): Secondary | ICD-10-CM | POA: Insufficient documentation

## 2022-06-25 DIAGNOSIS — Z7901 Long term (current) use of anticoagulants: Secondary | ICD-10-CM | POA: Insufficient documentation

## 2022-06-25 DIAGNOSIS — I480 Paroxysmal atrial fibrillation: Secondary | ICD-10-CM

## 2022-06-25 DIAGNOSIS — Z7984 Long term (current) use of oral hypoglycemic drugs: Secondary | ICD-10-CM | POA: Diagnosis not present

## 2022-06-25 DIAGNOSIS — G35 Multiple sclerosis: Secondary | ICD-10-CM | POA: Insufficient documentation

## 2022-06-25 DIAGNOSIS — I4819 Other persistent atrial fibrillation: Secondary | ICD-10-CM | POA: Diagnosis not present

## 2022-06-25 DIAGNOSIS — I5032 Chronic diastolic (congestive) heart failure: Secondary | ICD-10-CM | POA: Diagnosis present

## 2022-06-25 DIAGNOSIS — I7121 Aneurysm of the ascending aorta, without rupture: Secondary | ICD-10-CM | POA: Diagnosis not present

## 2022-06-25 DIAGNOSIS — Q2543 Congenital aneurysm of aorta: Secondary | ICD-10-CM

## 2022-06-25 DIAGNOSIS — Z79899 Other long term (current) drug therapy: Secondary | ICD-10-CM | POA: Diagnosis not present

## 2022-06-25 DIAGNOSIS — I130 Hypertensive heart and chronic kidney disease with heart failure and stage 1 through stage 4 chronic kidney disease, or unspecified chronic kidney disease: Secondary | ICD-10-CM | POA: Diagnosis not present

## 2022-06-25 DIAGNOSIS — I2729 Other secondary pulmonary hypertension: Secondary | ICD-10-CM | POA: Insufficient documentation

## 2022-06-25 DIAGNOSIS — I071 Rheumatic tricuspid insufficiency: Secondary | ICD-10-CM | POA: Diagnosis not present

## 2022-06-25 LAB — BASIC METABOLIC PANEL
Anion gap: 9 (ref 5–15)
BUN: 74 mg/dL — ABNORMAL HIGH (ref 8–23)
CO2: 25 mmol/L (ref 22–32)
Calcium: 10 mg/dL (ref 8.9–10.3)
Chloride: 100 mmol/L (ref 98–111)
Creatinine, Ser: 2.3 mg/dL — ABNORMAL HIGH (ref 0.61–1.24)
GFR, Estimated: 29 mL/min — ABNORMAL LOW (ref >60)
Glucose, Bld: 99 mg/dL (ref 70–99)
Potassium: 5 mmol/L (ref 3.5–5.1)
Sodium: 134 mmol/L — ABNORMAL LOW (ref 135–145)

## 2022-06-25 LAB — BRAIN NATRIURETIC PEPTIDE: B Natriuretic Peptide: 221.5 pg/mL — ABNORMAL HIGH (ref 0.0–100.0)

## 2022-06-25 MED ORDER — DAPAGLIFLOZIN PROPANEDIOL 10 MG PO TABS: 10.0000 mg | ORAL_TABLET | Freq: Every day | ORAL | 11 refills | Status: DC

## 2022-06-25 MED ORDER — FUROSEMIDE 40 MG PO TABS: 40.0000 mg | ORAL_TABLET | Freq: Every day | ORAL | 3 refills | Status: DC

## 2022-06-25 NOTE — Patient Instructions (Signed)
Start Farxiga 10 mg daily  Change lasix to 40mg  daily.  Labs done today, your results will be available in MyChart, we will contact you for abnormal readings.  Repeat blood work in 3 weeks   Your physician has requested that you have an echocardiogram. Echocardiography is a painless test that uses sound waves to create images of your heart. It provides your doctor with information about the size and shape of your heart and how well your heart's chambers and valves are working. This procedure takes approximately one hour. There are no restrictions for this procedure.  Your physician recommends that you schedule a follow-up appointment in: 1 year ( October 2024)  **please call the office in July 2024 to arrange your follow up appointment **  If you have any questions or concerns before your next appointment please send [place] a message through [place] or call our office at [phone].    TO LEAVE A MESSAGE FOR THE NURSE SELECT OPTION 2, PLEASE LEAVE A MESSAGE INCLUDING: YOUR NAME DATE OF BIRTH CALL BACK NUMBER REASON FOR CALL**this is important as we prioritize the call backs  YOU WILL RECEIVE A CALL BACK THE SAME DAY AS LONG AS YOU CALL BEFORE 4:00 PM  At the [facility], you and your health needs are our priority. As part of our continuing mission to provide you with exceptional heart care, we have created designated Provider Care Teams. These Care Teams include your primary Cardiologist (physician) and Advanced Practice Providers (APPs- Physician Assistants and Nurse Practitioners) who all work together to provide you with the care you need, when you need it.   You may see any of the following providers on your designated Care Team at your next follow up: Dr Glori Bickers Dr Loralie Champagne Dr. Roxana Hires, NP Lyda Jester, [place] [facility] Jette, [place] Forestine Na, NP Audry Riles, PharmD   Please be sure to bring in all your  medications bottles to every appointment.

## 2022-06-25 NOTE — Progress Notes (Signed)
Advanced Heart Failure Clinic Note   Date:  06/25/2022   ID:  [name], DOB [birth year], MRN [id number]  Location: Home  Provider location:  Advanced Heart Failure Clinic Type of Visit: Established patient  PCP:  [name], MD  Cardiologist:  None Primary HF: [name]  Chief Complaint: Heart Failure follow-up   History of Present Illness:  [name] is a 76 year old retired Engineer, production professor with a history of advanced multiple sclerosis, Type A aortic dissection 2000, right heart failure with severe TR and chronic AF referred by [name] for further evaluation for possible TV repair.    He presented acutely on October 15, 1998 with acute type A aortic dissection. He underwent emergency surgical repair with hemi-arch replacement of the ascending thoracic aorta and resuspension of the native aortic valve. 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 Because of the patient's underlying multiple sclerosis with severe generalized weakness patient was not felt to be candidate for surgical intervention. He was referred to [facility] for a second opinion and again not felt to be candidate for elective surgery   Admitted in December 2014 with right heart failure including severe bilateral lower extremity edema and generalized anasarca. Diuresed well and was started on Coumadin for long-term anticoagulation because of chronic persistent atrial fibrillation.  Echo in 3/15 actually showed that tricuspid regurgitation looked more moderate; EF 55%, mild AI, aortic root at sinuses of valsalva 4.8 cm, moderate MR, mildly dilated RV moderate TR, PA systolic pressure 46 mmHg.   Seen by [name] 04/2020 [name] and metolazone d/ced.    Here for f/u. MS has been progressing. Now can't swim laps. Can only walk away from wall  and swim back.   Echo 01/05/20 EF 55-60% RV mildly dilated with normal function. AoRoot 5.8 cm mild AI  Mild to moderate TR   Echo 4/18  LVEF 60-65% RV dilated with normal function. Moderate to severe TR. AoRoot dilated 6.0 cm with mild AI  Echo 1/20 LVEF 55-60% moderate MR. RV severely dilated. Function normal severe TR. AoRoot dilated 6.0 cm with mild AI   Past Medical History:  Diagnosis Date   Anemia    Aneurysm of aortic arch ([place]) 04/04/2012   Chronic aneurysmal dilatation of aortic arch with chronic type A aortic dissection, s/p replacement of ascending thoracic aorta   Aortic dissection ([facility]) 11/05/1998   S/P emergency repair of acute type A aortic dissection with resuspension of native aortic valve   Aortic dissection, thoracic (HCC)    Aortic root aneurysm (HCC)    Atrial fibrillation ([place]) 01/30/2013   Atrial fibrillation    Bilateral lower extremity edema    BPH (benign prostatic hyperplasia)    CHF (congestive heart failure) ([place]) 1946-04-11   2D Echo - EF 50-55%, mild-moderately dilated right ventricle, mild-moderate tricuspid valve regurgitation, moderately dilated right atrium   Depression    Dyslipidemia    Exertional shortness of breath    "for awhile now" (09/11/2013)   Hemorrhoids    History of blood transfusion    Hypertension    Mitral regurgitation 02/20/2013   Multiple sclerosis (HCC)    Neuromuscular disorder (HCC)    Pleural effusion, right, large 01/30/2013   Severe tricuspid valve regurgitation 01/30/2013   Severe tricuspid regurg by recent 2-D echo with a dilated tricuspid annulus, moderate pulmonary hypertension  and biatrial enlargement    Thrombocytopenia [facility])    Past Surgical History:  Procedure Laterality Date   HEMORRHOID SURGERY  2009   Internal, external hemorrhoidectomy, general anesthesia,prone position. [Other]   Open reduction and internal fixation of right distal radius fracture using [employer] distal radius volar locking plate.   07/2005   Dr Ninfa Linden   Repair of acute type A aortic dissection with resuspension of native aortic valve  10/15/1998   Dr Roxy Manns   TEE WITHOUT CARDIOVERSION N/A 02/17/2013   Procedure: TRANSESOPHAGEAL ECHOCARDIOGRAM (TEE);  Surgeon: [name], MD;  Location: [facility] ENDOSCOPY;  Service: Cardiovascular;  Laterality: N/A;     Current Outpatient Medications  Medication Sig Dispense Refill   baclofen (LIORESAL) 10 MG tablet Take 5 mg by mouth at bedtime.     Cholecalciferol (VITAMIN D3) 2000 UNITS TABS Take 1 capsule by mouth daily.     COPAXONE 40 MG/ML SOSY INJECT ONE SYRINGE SUBCUTANEOUSLY 3 TIMES A WEEK AT LEAST 48 HOURS APART. ALLOW TO WARM TO ROOM TEMP FOR 20 MINUTES. REFRIGERATE. 36 mL 2   dalfampridine 10 MG TB12 TAKE 1 TABLET BY MOUTH 2 TIMES A DAY (APPROXIMATELY 12 HOURS APART). MAY BE TAKEN WITH OR WITHOUT FOOD. DO NOT CUT OR CRUSH. STORE AT ROOM T 180 tablet 2   ELIQUIS 5 MG TABS tablet Take 1 tablet (5 mg total) by mouth 2 (two) times daily. 60 tablet 0   folic acid (FOLVITE) 1 MG tablet Take 1 mg by mouth daily.     furosemide (LASIX) 40 MG tablet Take 40 mg by mouth every other day. Alternating with 80 mg     losartan (COZAAR) 100 MG tablet Take 1 tablet (100 mg total) by mouth daily. 30 tablet 0   Multiple Vitamin (MULTIVITAMIN WITH MINERALS) TABS tablet Take 1 tablet by mouth daily. Centrum for men.     QUEtiapine (SEROQUEL) 25 MG tablet Take 25 mg by mouth at bedtime as needed.     No current facility-administered medications for this encounter.    Allergies:   Patient has no known allergies.   Social History:  The patient  reports that he has never smoked. He has never used smokeless tobacco. He reports current alcohol use. He reports that he does not use drugs.   Family History:  The patient's family history includes Emphysema in his brother; Heart attack in his mother; Lung cancer in his brother; Thyroid cancer in his father.   ROS:  Please see the history of present  illness.   All other systems are personally reviewed and negative.   Vitals:   06/25/22 1524  BP: 118/70  Pulse: 61  SpO2: 100%   Wt Readings from Last 3 Encounters:  06/20/21 81.8 kg (180 lb 6.4 oz)  05/27/21 76.7 kg (169 lb)  09/23/20 75.3 kg (166 lb)   Exam:   General:  Weak appearing. No resp difficulty HEENT: normal Neck: supple. JVP 7-8 with prominent v-waves  Carotids 2+ bilat; no bruits. No lymphadenopathy or thryomegaly appreciated. Cor: PMI nondisplaced. Irregular rate & rhythm. 2/6 TR Lungs: clear Abdomen: soft, nontender, nondistended. No hepatosplenomegaly. No bruits or masses. Good bowel sounds. Extremities: no cyanosis, clubbing, rash, edema brace on RLE Neuro: alert & orientedx3, cranial nerves grossly intact. moves all 4 extremities w/o difficulty. Affect pleasant  Recent Labs: 07/23/2021: BUN 86; Creatinine, Ser 2.03; Potassium 4.8; Sodium 136  Personally reviewed   Wt Readings from Last 3 Encounters:  06/20/21 81.8 kg (180 lb 6.4 oz)  05/27/21 76.7 kg (169 lb)  09/23/20 75.3 kg (166 lb)     ASSESSMENT AND PLAN:  1. Chronic diastolic/Right heart failure with severe TR  - Last echo 4/18 LV/RV function stable. TR remains moderate to severe - Echo 1/20 LVEF 55-60% moderate MR. RV severely dilated. Function normal severe TR. AoRoot dilated 6.0 cm with mild AI - Echo today (01/04/20) EF 55-60% RV mildly dilated with mildly reduced function. AoRoot 5.8 cm mild AI  Mild to moderate TR - Symptomatically worse due to progressive MS. NYHA III. Volume status ok - Will add Farxiga 10 daily - Decrease lasix to 40 daily  - He is not a candidate for a tricuspid valve operation due to MS   2. Aortic dissection:  - s/p hemi-arch replacement of the ascending thoracic aorta and resuspension of the native aortic valve 2000 - now with chronic dissection and aneurysm involving sinus of Valsalva and aortic arch - CTA 4/18 was stable. Continue to follow with TCTS - Continue  Toprol 25  - echo 1/20 AoRoot stable at 6.0 cm - echo 4/21 AoRoot stable at 5.8cm. not candidate for surgery - repeat echo   3. HTN  -  Blood pressure well controlled. Continue current regimen.  4. Chronic AF:  - Rate controlled. Continue Eliquis and Toprol - Denies bleeding  5. Severe multiple sclerosis - continue to follow with Neuro  - has been progressive unfortunately  6. CKD 4 - Wann stable 1.9-2.0 - Followed by Dr. Carolynne Edouard - Will add Wilder Glade for nephroprotection - d/w Dr. Carolynne Edouard by phone - check labs today and in 2-3 weeks   Signed, Glori Bickers, MD  06/25/2022 4:16 PM  [facility] [address] [facility] [place] [address] [phone] (office) [phone] (fax)

## 2022-06-26 ENCOUNTER — Other Ambulatory Visit (HOSPITAL_COMMUNITY): Payer: Self-pay

## 2022-06-28 ENCOUNTER — Encounter (HOSPITAL_COMMUNITY): Payer: Self-pay

## 2022-06-29 ENCOUNTER — Encounter (HOSPITAL_COMMUNITY): Payer: Self-pay

## 2022-07-12 ENCOUNTER — Other Ambulatory Visit (HOSPITAL_COMMUNITY): Payer: Self-pay | Admitting: Internal Medicine

## 2022-07-16 ENCOUNTER — Ambulatory Visit (HOSPITAL_COMMUNITY)
Admission: RE | Admit: 2022-07-16 | Discharge: 2022-07-16 | Disposition: A | Discharge status: Home / Self Care | Payer: Medicare Other | Source: Ambulatory Visit | Attending: Internal Medicine | Admitting: Internal Medicine

## 2022-07-16 ENCOUNTER — Ambulatory Visit (HOSPITAL_BASED_OUTPATIENT_CLINIC_OR_DEPARTMENT_OTHER)
Admission: RE | Admit: 2022-07-16 | Discharge: 2022-07-16 | Disposition: A | Discharge status: Home / Self Care | Payer: Medicare Other | Source: Ambulatory Visit

## 2022-07-16 DIAGNOSIS — I5032 Chronic diastolic (congestive) heart failure: Secondary | ICD-10-CM

## 2022-07-16 DIAGNOSIS — I083 Combined rheumatic disorders of mitral, aortic and tricuspid valves: Secondary | ICD-10-CM | POA: Insufficient documentation

## 2022-07-16 LAB — BASIC METABOLIC PANEL
Anion gap: 9 (ref 5–15)
BUN: 62 mg/dL — ABNORMAL HIGH (ref 8–23)
CO2: 24 mmol/L (ref 22–32)
Calcium: 9.7 mg/dL (ref 8.9–10.3)
Chloride: 104 mmol/L (ref 98–111)
Creatinine, Ser: 2.18 mg/dL — ABNORMAL HIGH (ref 0.61–1.24)
GFR, Estimated: 31 mL/min — ABNORMAL LOW (ref >60)
Glucose, Bld: 90 mg/dL (ref 70–99)
Potassium: 5.2 mmol/L — ABNORMAL HIGH (ref 3.5–5.1)
Sodium: 137 mmol/L (ref 135–145)

## 2022-07-16 LAB — ECHOCARDIOGRAM COMPLETE
Area-P 1/2: 4.19 cm2
MV M vel: 4.84 m/s
MV Peak grad: 93.7 mmHg
P 1/2 time: 582 msec
S' Lateral: 3.3 cm

## 2022-07-17 ENCOUNTER — Other Ambulatory Visit (HOSPITAL_COMMUNITY): Payer: Self-pay | Admitting: Internal Medicine

## 2022-07-27 ENCOUNTER — Telehealth (HOSPITAL_COMMUNITY): Payer: Self-pay

## 2022-07-27 NOTE — Telephone Encounter (Signed)
Patient aware of results.

## 2022-07-28 ENCOUNTER — Other Ambulatory Visit (HOSPITAL_COMMUNITY): Payer: Self-pay | Admitting: Internal Medicine

## 2022-08-07 ENCOUNTER — Other Ambulatory Visit (HOSPITAL_COMMUNITY): Payer: Self-pay | Admitting: Internal Medicine

## 2022-08-15 ENCOUNTER — Other Ambulatory Visit (HOSPITAL_COMMUNITY): Payer: Self-pay | Admitting: Internal Medicine

## 2022-08-24 ENCOUNTER — Other Ambulatory Visit: Payer: Self-pay | Admitting: Diagnostic Neuroimaging

## 2022-09-16 ENCOUNTER — Telehealth: Payer: Self-pay | Admitting: Diagnostic Neuroimaging

## 2022-09-16 ENCOUNTER — Other Ambulatory Visit: Payer: Self-pay | Admitting: Diagnostic Neuroimaging

## 2022-09-16 ENCOUNTER — Other Ambulatory Visit: Payer: Self-pay

## 2022-09-16 DIAGNOSIS — G35 Multiple sclerosis: Secondary | ICD-10-CM

## 2022-09-16 DIAGNOSIS — G35D Multiple sclerosis, unspecified: Secondary | ICD-10-CM

## 2022-09-16 DIAGNOSIS — R269 Unspecified abnormalities of gait and mobility: Secondary | ICD-10-CM

## 2022-09-16 MED ORDER — COPAXONE 40 MG/ML ~~LOC~~ SOSY: PREFILLED_SYRINGE | SUBCUTANEOUS | 2 refills | Status: DC

## 2022-09-16 MED ORDER — DALFAMPRIDINE ER 10 MG PO TB12
ORAL_TABLET | ORAL | 2 refills | Status: DC
Start: 2022-09-16 — End: 2023-03-02

## 2022-09-16 NOTE — Telephone Encounter (Signed)
Pt said, [facility] informed pt received a prescription for dalfampridine 10 MG TB12 and they can not fill it. Prescription should be send to [facility].

## 2022-09-20 ENCOUNTER — Other Ambulatory Visit (HOSPITAL_COMMUNITY): Payer: Self-pay | Admitting: Internal Medicine

## 2022-09-23 ENCOUNTER — Emergency Department (HOSPITAL_COMMUNITY): Payer: Medicare Other

## 2022-09-23 ENCOUNTER — Encounter (HOSPITAL_COMMUNITY): Payer: Self-pay

## 2022-09-23 ENCOUNTER — Emergency Department (HOSPITAL_COMMUNITY)
Admission: EM | Admit: 2022-09-23 | Discharge: 2022-09-23 | Disposition: A | Discharge status: Home / Self Care | Payer: Medicare Other | Source: Home / Self Care | Attending: Emergency Medicine | Admitting: Emergency Medicine

## 2022-09-23 DIAGNOSIS — N186 End stage renal disease: Secondary | ICD-10-CM | POA: Insufficient documentation

## 2022-09-23 DIAGNOSIS — I495 Sick sinus syndrome: Secondary | ICD-10-CM | POA: Diagnosis not present

## 2022-09-23 DIAGNOSIS — N179 Acute kidney failure, unspecified: Secondary | ICD-10-CM | POA: Insufficient documentation

## 2022-09-23 DIAGNOSIS — I5030 Unspecified diastolic (congestive) heart failure: Secondary | ICD-10-CM | POA: Insufficient documentation

## 2022-09-23 DIAGNOSIS — M7989 Other specified soft tissue disorders: Secondary | ICD-10-CM

## 2022-09-23 DIAGNOSIS — Z7901 Long term (current) use of anticoagulants: Secondary | ICD-10-CM | POA: Insufficient documentation

## 2022-09-23 DIAGNOSIS — R34 Anuria and oliguria: Secondary | ICD-10-CM | POA: Diagnosis not present

## 2022-09-23 LAB — CBC WITH DIFFERENTIAL/PLATELET
Abs Immature Granulocytes: 0.02 10*3/uL (ref 0.00–0.07)
Basophils Absolute: 0 10*3/uL (ref 0.0–0.1)
Basophils Relative: 1 %
Eosinophils Absolute: 0 10*3/uL (ref 0.0–0.5)
Eosinophils Relative: 1 %
HCT: 36.6 % — ABNORMAL LOW (ref 39.0–52.0)
Hemoglobin: 11.5 g/dL — ABNORMAL LOW (ref 13.0–17.0)
Immature Granulocytes: 0 %
Lymphocytes Relative: 9 %
Lymphs Abs: 0.5 10*3/uL — ABNORMAL LOW (ref 0.7–4.0)
MCH: 28.3 pg (ref 26.0–34.0)
MCHC: 31.4 g/dL (ref 30.0–36.0)
MCV: 90.1 fL (ref 80.0–100.0)
Monocytes Absolute: 0.4 10*3/uL (ref 0.1–1.0)
Monocytes Relative: 8 %
Neutro Abs: 4.3 10*3/uL (ref 1.7–7.7)
Neutrophils Relative %: 81 %
Platelets: 137 10*3/uL — ABNORMAL LOW (ref 150–400)
RBC: 4.06 MIL/uL — ABNORMAL LOW (ref 4.22–5.81)
RDW: 13.9 % (ref 11.5–15.5)
WBC: 5.3 10*3/uL (ref 4.0–10.5)
nRBC: 0 % (ref 0.0–0.2)

## 2022-09-23 LAB — URINALYSIS, ROUTINE W REFLEX MICROSCOPIC
Bilirubin Urine: NEGATIVE
Glucose, UA: NEGATIVE mg/dL
Hgb urine dipstick: NEGATIVE
Ketones, ur: NEGATIVE mg/dL
Leukocytes,Ua: NEGATIVE
Nitrite: NEGATIVE
Protein, ur: NEGATIVE mg/dL
Specific Gravity, Urine: 1.006 (ref 1.005–1.030)
pH: 6 (ref 5.0–8.0)

## 2022-09-23 LAB — COMPREHENSIVE METABOLIC PANEL
ALT: 62 U/L — ABNORMAL HIGH (ref 0–44)
AST: 54 U/L — ABNORMAL HIGH (ref 15–41)
Albumin: 4.3 g/dL (ref 3.5–5.0)
Alkaline Phosphatase: 267 U/L — ABNORMAL HIGH (ref 38–126)
Anion gap: 9 (ref 5–15)
BUN: 82 mg/dL — ABNORMAL HIGH (ref 8–23)
CO2: 23 mmol/L (ref 22–32)
Calcium: 9.4 mg/dL (ref 8.9–10.3)
Chloride: 103 mmol/L (ref 98–111)
Creatinine, Ser: 3.14 mg/dL — ABNORMAL HIGH (ref 0.61–1.24)
GFR, Estimated: 20 mL/min — ABNORMAL LOW (ref >60)
Glucose, Bld: 102 mg/dL — ABNORMAL HIGH (ref 70–99)
Potassium: 5.6 mmol/L — ABNORMAL HIGH (ref 3.5–5.1)
Sodium: 135 mmol/L (ref 135–145)
Total Bilirubin: 0.5 mg/dL (ref 0.3–1.2)
Total Protein: 7.5 g/dL (ref 6.5–8.1)

## 2022-09-23 LAB — BRAIN NATRIURETIC PEPTIDE: B Natriuretic Peptide: 220.4 pg/mL — ABNORMAL HIGH (ref 0.0–100.0)

## 2022-09-23 MED ORDER — FUROSEMIDE 10 MG/ML IJ SOLN
40.0000 mg | Freq: Once | INTRAMUSCULAR | Status: AC
  Administered 2022-09-23: 40 mg via INTRAVENOUS
  Filled 2022-09-23: qty 4

## 2022-09-23 MED ORDER — FUROSEMIDE 80 MG PO TABS: 80.0000 mg | ORAL_TABLET | Freq: Two times a day (BID) | ORAL | 0 refills | Status: DC

## 2022-09-23 MED ORDER — SODIUM ZIRCONIUM CYCLOSILICATE 10 G PO PACK
10.0000 g | PACK | Freq: Once | ORAL | Status: AC
  Administered 2022-09-23: 10 g via ORAL
  Filled 2022-09-23: qty 1

## 2022-09-23 NOTE — ED Provider Triage Note (Signed)
Emergency Medicine Provider Triage Evaluation Note  DAISY MCNEEL , a 77 y.o. male  was evaluated in triage.  Pt complains of decreased urination xseveral days. Has increased furosemide without increased urination. States has HF and renal issues but is not on dialysis. Has gained weight and feels SOB. No chest pain, back pain, or abdominal pain.  Review of Systems  Positive: Urinary retention Negative: Chest pain  Physical Exam  BP (!) 145/85 (BP Location: Left Arm)   Pulse 80   Temp (!) 97.5 F (36.4 C) (Oral)   Resp 16   SpO2 100%  Gen:   Awake, no distress   Resp:  Normal effort  MSK:   Moves extremities without difficulty  Other:  +ascites, no edema of BLE  Medical Decision Making  Medically screening exam initiated at 12:14 PM.  Appropriate orders placed.  JAKEB LAMPING was informed that the remainder of the evaluation will be completed by another provider, this initial triage assessment does not replace that evaluation, and the importance of remaining in the ED until their evaluation is complete.     Osvaldo Shipper, [place] 09/23/22 1217

## 2022-09-23 NOTE — Discharge Instructions (Addendum)
I am increasing her Lasix to 80 mg twice daily for the next 3 days to help you with swelling.  You need to call cardiology or your primary care doctor tomorrow to get your kidney function rechecked on Friday.  Please weigh yourself daily  If you have worsening shortness of breath or chest pain or dizziness or unable to walk please return to the ER and you will likely need admission at that time.

## 2022-09-23 NOTE — ED Triage Notes (Addendum)
Pt presents via EMS with home with c/o decreased urinary output for approx 4-5 days. Pt reports he is in end stage kidney failure.

## 2022-09-23 NOTE — ED Provider Notes (Signed)
[facility] DEPT Provider Note   CSN: [id number] Arrival date & time: 09/23/22  1135     History  Chief Complaint  Patient presents with   Decreased Urinary Output    [name] is a 77 y.o. male history of diastolic heart failure, A-fib on Eliquis, multiple sclerosis and incontinent at baseline, here presenting with decreased urine output and weight gain.  Patient states that he was told to increase his Lasix to 80 mg daily due to worsening swelling.  He has been on 80 mg daily for the last 3 days.  He states that he took 80 mg today and his weight actually went up and he has trouble urinating.  He states that he never had a Foley catheter and does not straight cath at baseline.  He denies any shortness of breath.   The history is provided by the patient.       Home Medications Prior to Admission medications   Medication Sig Start Date End Date Taking? Authorizing Provider  apixaban (ELIQUIS) 5 MG TABS tablet Take 1 tablet (5 mg total) by mouth 2 (two) times daily. 08/10/22  Yes Bensimhon, Shaune Pascal, MD  baclofen (LIORESAL) 10 MG tablet Take 5 mg by mouth at bedtime.   Yes [provider]  COPAXONE 40 MG/ML SOSY INJECT ONE SYRINGE SUBCUTANEOUSLY 3 TIMES A WEEK AT LEAST 48 HOURS APART. ALLOW TO WARM TO ROOM TEMP FOR 20 MINUTES. REFRIGERATE. Patient taking differently: Inject 1 Syringe into the skin See admin instructions. INJECT ONE SYRINGE SUBCUTANEOUSLY 3 TIMES A WEEK (Mon/Wed/Fri) AT LEAST 48 HOURS APART. ALLOW TO WARM TO ROOM TEMP FOR 20 MINUTES. REFRIGERATE. 09/16/22  Yes Penumalli, Earlean Polka, MD  dalfampridine 10 MG TB12 TAKE 1 TABLET BY MOUTH 2 TIMES A DAY (APPROXIMATELY 12 HOURS APART). MAY BE TAKEN WITH OR WITHOUT FOOD. DO NOT CUT OR CRUSH. STORE AT ROOM T Patient taking differently: 10 mg See admin instructions. TAKE 10 MG (1 TABLET) BY MOUTH 2 TIMES A DAY (APPROXIMATELY 12 HOURS APART). MAY BE TAKEN WITH OR WITHOUT FOOD. DO NOT CUT OR  CRUSH. STORE AT ROOM TEMP. 09/16/22  Yes McCue, Janett Billow, NP  Cholecalciferol (VITAMIN D3) 2000 UNITS TABS Take 2,000 Units by mouth daily.    [provider]  COPAXONE 40 MG/ML SOSY INJECT ONE SYRINGE SUBCUTANEOUSLY 3 TIMES A WEEK AT LEAST 48 HOURS APART. ALLOW TO WARM TO ROOM TEMP FOR 20 MINUTES. REFRIGERATE. Patient not taking: Reported on 09/23/2022 08/24/22   Penumalli, Earlean Polka, MD  dapagliflozin propanediol (FARXIGA) 10 MG TABS tablet Take 1 tablet (10 mg total) by mouth daily before breakfast. 06/25/22   Bensimhon, Shaune Pascal, MD  folic acid (FOLVITE) 1 MG tablet Take 1 mg by mouth daily.    [provider]  furosemide (LASIX) 40 MG tablet TAKE ONE TABLET BY MOUTH DAILY EXCEPT ON MONDAYS AND FRIDAYS TAKE TWO TABLETS DAILY Patient taking differently: Take 20-40 mg by mouth See admin instructions. 09/21/22   Bensimhon, Shaune Pascal, MD  losartan (COZAAR) 100 MG tablet Take 1 tablet (100 mg total) by mouth daily. 08/17/22   Bensimhon, Shaune Pascal, MD  Multiple Vitamin (MULTIVITAMIN WITH MINERALS) TABS tablet Take 1 tablet by mouth daily. Centrum for men.    [provider]  QUEtiapine (SEROQUEL) 25 MG tablet Take 25 mg by mouth at bedtime as needed.    [provider]      Allergies    Patient has no known allergies.  Review of Systems   Review of Systems  Cardiovascular:  Positive for leg swelling.  All other systems reviewed and are negative.   Physical Exam Updated Vital Signs BP 114/68   Pulse 66   Temp 97.6 F (36.4 C)   Resp 16   SpO2 100%  Physical Exam Vitals and nursing note reviewed.  Constitutional:      Comments: Chronically ill  HENT:     Head: Normocephalic.     Nose: Nose normal.     Mouth/Throat:     Mouth: Mucous membranes are moist.  Eyes:     Extraocular Movements: Extraocular movements intact.     Pupils: Pupils are equal, round, and reactive to light.  Cardiovascular:     Rate and Rhythm: Normal rate and regular rhythm.      Pulses: Normal pulses.     Heart sounds: Normal heart sounds.  Pulmonary:     Effort: Pulmonary effort is normal.     Breath sounds: Normal breath sounds.  Abdominal:     General: Abdomen is flat.     Palpations: Abdomen is soft.  Musculoskeletal:     Cervical back: Normal range of motion and neck supple.     Comments: Strength is 2 out of 5 bilateral lower extremities which is chronic.  Patient has 1+ pitting edema bilaterally.  Skin:    General: Skin is warm.     Capillary Refill: Capillary refill takes less than 2 seconds.  Neurological:     General: No focal deficit present.     Comments: Strength is 3 out of 5 bilateral lower extremities.  Psychiatric:        Mood and Affect: Mood normal.        Behavior: Behavior normal.     ED Results / Procedures / Treatments   Labs (all labs ordered are listed, but only abnormal results are displayed) Labs Reviewed  COMPREHENSIVE METABOLIC PANEL - Abnormal; Notable for the following components:      Result Value   Potassium 5.6 (*)    Glucose, Bld 102 (*)    BUN 82 (*)    Creatinine, Ser 3.14 (*)    AST 54 (*)    ALT 62 (*)    Alkaline Phosphatase 267 (*)    GFR, Estimated 20 (*)    All other components within normal limits  BRAIN NATRIURETIC PEPTIDE - Abnormal; Notable for the following components:   B Natriuretic Peptide 220.4 (*)    All other components within normal limits  CBC WITH DIFFERENTIAL/PLATELET - Abnormal; Notable for the following components:   RBC 4.06 (*)    Hemoglobin 11.5 (*)    HCT 36.6 (*)    Platelets 137 (*)    Lymphs Abs 0.5 (*)    All other components within normal limits  URINALYSIS, ROUTINE W REFLEX MICROSCOPIC    EKG None  Radiology DG Chest 2 View  Result Date: 09/23/2022 CLINICAL DATA:  Weight gain. Congestive heart failure. Shortness of breath. End-stage renal failure. EXAM: CHEST - 2 VIEW COMPARISON:  AP chest 03/01/2020 FINDINGS: Status post median sternotomy. Cardiac silhouette is  again mildly to moderately enlarged. Mediastinal contours are within normal limits. The lungs are clear. No pleural effusion or pneumothorax. Mild lateral lower lung horizontal linear subsegmental atelectasis versus scarring. No pleural effusion or pneumothorax. Mild multilevel degenerative disc changes of the thoracic spine. IMPRESSION: 1. No acute lung process. 2. Mild to moderate cardiomegaly. Electronically Signed   By: Viann Fish.D.  On: 09/23/2022 12:36    Procedures Procedures    Medications Ordered in ED Medications  furosemide (LASIX) injection 40 mg (40 mg Intravenous Given 09/23/22 1607)    ED Course/ Medical Decision Making/ A&P                           Medical Decision Making [name] is a 77 y.o. male here presenting with leg swelling and weight gain.  Patient has history of renal insufficiency and also diastolic heart failure.  Consider worsening renal failure versus heart failure.  Consider also urine retention.  Patient's bladder scan showed 160 cc on arrival.  Plan to get CBC and CMP and BNP and chest x-ray.  7:35 PM Patient's creatinine went up to 3.  His potassium went up to 5.6.  He is not in retention and his ultrasound showed no hydro.  Patient urinated about 400 cc after 40 mg IV Lasix.  I offered admission since he has worsening kidney function.  However patient states that he has been in the hospital so many times for his multiple sclerosis and would like to go home when possible.  I told him that I would recommend admission given worsening kidney function.  He does have good follow-up with cardiology.  I told him that given his situation I would increase his Lasix to 80 mg twice daily for 3 days.  He will need to see cardiology in the next 2 days to recheck his kidney function.  If his kidney function is worsening, patient will need admission.  Problems Addressed: AKI (acute kidney injury) [facility]): acute illness or injury Leg swelling: acute illness or  injury  Amount and/or Complexity of Data Reviewed Labs: ordered. Decision-making details documented in ED Course. Radiology: ordered and independent interpretation performed. Decision-making details documented in ED Course.  Risk Prescription drug management.    Final Clinical Impression(s) / ED Diagnoses Final diagnoses:  None    Rx / DC Orders ED Discharge Orders     None         Drenda Freeze, MD 09/23/22 [phone]

## 2022-09-25 ENCOUNTER — Other Ambulatory Visit: Payer: Self-pay

## 2022-09-25 ENCOUNTER — Encounter (HOSPITAL_COMMUNITY): Payer: Self-pay

## 2022-09-25 ENCOUNTER — Emergency Department (HOSPITAL_COMMUNITY): Payer: Medicare Other

## 2022-09-25 ENCOUNTER — Inpatient Hospital Stay (HOSPITAL_COMMUNITY)
Admission: EM | Admit: 2022-09-25 | Discharge: 2022-10-02 | DRG: 242 | Disposition: A | Discharge status: Skilled Nursing Facility | Payer: Medicare Other | Attending: Internal Medicine | Admitting: Internal Medicine

## 2022-09-25 DIAGNOSIS — N401 Enlarged prostate with lower urinary tract symptoms: Secondary | ICD-10-CM | POA: Diagnosis present

## 2022-09-25 DIAGNOSIS — R338 Other retention of urine: Secondary | ICD-10-CM | POA: Diagnosis present

## 2022-09-25 DIAGNOSIS — D631 Anemia in chronic kidney disease: Secondary | ICD-10-CM | POA: Diagnosis not present

## 2022-09-25 DIAGNOSIS — I50812 Chronic right heart failure: Secondary | ICD-10-CM | POA: Diagnosis not present

## 2022-09-25 DIAGNOSIS — I071 Rheumatic tricuspid insufficiency: Secondary | ICD-10-CM | POA: Diagnosis present

## 2022-09-25 DIAGNOSIS — I77819 Aortic ectasia, unspecified site: Secondary | ICD-10-CM | POA: Diagnosis present

## 2022-09-25 DIAGNOSIS — G35 Multiple sclerosis: Secondary | ICD-10-CM | POA: Diagnosis present

## 2022-09-25 DIAGNOSIS — I495 Sick sinus syndrome: Principal | ICD-10-CM | POA: Diagnosis present

## 2022-09-25 DIAGNOSIS — N179 Acute kidney failure, unspecified: Secondary | ICD-10-CM

## 2022-09-25 DIAGNOSIS — Z808 Family history of malignant neoplasm of other organs or systems: Secondary | ICD-10-CM | POA: Diagnosis not present

## 2022-09-25 DIAGNOSIS — I5082 Biventricular heart failure: Secondary | ICD-10-CM | POA: Diagnosis present

## 2022-09-25 DIAGNOSIS — I132 Hypertensive heart and chronic kidney disease with heart failure and with stage 5 chronic kidney disease, or end stage renal disease: Secondary | ICD-10-CM | POA: Diagnosis not present

## 2022-09-25 DIAGNOSIS — Z801 Family history of malignant neoplasm of trachea, bronchus and lung: Secondary | ICD-10-CM | POA: Diagnosis not present

## 2022-09-25 DIAGNOSIS — F05 Delirium due to known physiological condition: Secondary | ICD-10-CM | POA: Diagnosis not present

## 2022-09-25 DIAGNOSIS — I213 ST elevation (STEMI) myocardial infarction of unspecified site: Secondary | ICD-10-CM

## 2022-09-25 DIAGNOSIS — E785 Hyperlipidemia, unspecified: Secondary | ICD-10-CM | POA: Diagnosis present

## 2022-09-25 DIAGNOSIS — R34 Anuria and oliguria: Principal | ICD-10-CM

## 2022-09-25 DIAGNOSIS — D638 Anemia in other chronic diseases classified elsewhere: Secondary | ICD-10-CM

## 2022-09-25 DIAGNOSIS — I272 Pulmonary hypertension, unspecified: Secondary | ICD-10-CM | POA: Diagnosis not present

## 2022-09-25 DIAGNOSIS — N1832 Chronic kidney disease, stage 3b: Secondary | ICD-10-CM | POA: Diagnosis present

## 2022-09-25 DIAGNOSIS — I10 Essential (primary) hypertension: Secondary | ICD-10-CM | POA: Diagnosis not present

## 2022-09-25 DIAGNOSIS — D696 Thrombocytopenia, unspecified: Secondary | ICD-10-CM | POA: Diagnosis present

## 2022-09-25 DIAGNOSIS — Z7901 Long term (current) use of anticoagulants: Secondary | ICD-10-CM

## 2022-09-25 DIAGNOSIS — Z950 Presence of cardiac pacemaker: Secondary | ICD-10-CM

## 2022-09-25 DIAGNOSIS — Z8249 Family history of ischemic heart disease and other diseases of the circulatory system: Secondary | ICD-10-CM

## 2022-09-25 DIAGNOSIS — I4891 Unspecified atrial fibrillation: Secondary | ICD-10-CM | POA: Diagnosis not present

## 2022-09-25 DIAGNOSIS — I482 Chronic atrial fibrillation, unspecified: Secondary | ICD-10-CM | POA: Diagnosis not present

## 2022-09-25 DIAGNOSIS — I5032 Chronic diastolic (congestive) heart failure: Secondary | ICD-10-CM | POA: Diagnosis present

## 2022-09-25 DIAGNOSIS — Z888 Allergy status to other drugs, medicaments and biological substances status: Secondary | ICD-10-CM | POA: Diagnosis not present

## 2022-09-25 DIAGNOSIS — Z825 Family history of asthma and other chronic lower respiratory diseases: Secondary | ICD-10-CM

## 2022-09-25 DIAGNOSIS — I442 Atrioventricular block, complete: Secondary | ICD-10-CM | POA: Diagnosis present

## 2022-09-25 DIAGNOSIS — I480 Paroxysmal atrial fibrillation: Secondary | ICD-10-CM | POA: Diagnosis present

## 2022-09-25 DIAGNOSIS — I493 Ventricular premature depolarization: Secondary | ICD-10-CM | POA: Diagnosis not present

## 2022-09-25 DIAGNOSIS — M62838 Other muscle spasm: Secondary | ICD-10-CM | POA: Diagnosis present

## 2022-09-25 DIAGNOSIS — R001 Bradycardia, unspecified: Secondary | ICD-10-CM | POA: Diagnosis not present

## 2022-09-25 DIAGNOSIS — G9341 Metabolic encephalopathy: Secondary | ICD-10-CM | POA: Diagnosis not present

## 2022-09-25 DIAGNOSIS — I4510 Unspecified right bundle-branch block: Secondary | ICD-10-CM | POA: Diagnosis present

## 2022-09-25 DIAGNOSIS — Z79899 Other long term (current) drug therapy: Secondary | ICD-10-CM

## 2022-09-25 LAB — CBC WITH DIFFERENTIAL/PLATELET
Abs Immature Granulocytes: 0.01 10*3/uL (ref 0.00–0.07)
Basophils Absolute: 0.1 10*3/uL (ref 0.0–0.1)
Basophils Relative: 1 %
Eosinophils Absolute: 0.1 10*3/uL (ref 0.0–0.5)
Eosinophils Relative: 1 %
HCT: 34.1 % — ABNORMAL LOW (ref 39.0–52.0)
Hemoglobin: 10.7 g/dL — ABNORMAL LOW (ref 13.0–17.0)
Immature Granulocytes: 0 %
Lymphocytes Relative: 11 %
Lymphs Abs: 0.6 10*3/uL — ABNORMAL LOW (ref 0.7–4.0)
MCH: 28.4 pg (ref 26.0–34.0)
MCHC: 31.4 g/dL (ref 30.0–36.0)
MCV: 90.5 fL (ref 80.0–100.0)
Monocytes Absolute: 0.7 10*3/uL (ref 0.1–1.0)
Monocytes Relative: 13 %
Neutro Abs: 3.9 10*3/uL (ref 1.7–7.7)
Neutrophils Relative %: 74 %
Platelets: 113 10*3/uL — ABNORMAL LOW (ref 150–400)
RBC: 3.77 MIL/uL — ABNORMAL LOW (ref 4.22–5.81)
RDW: 14 % (ref 11.5–15.5)
WBC: 5.2 10*3/uL (ref 4.0–10.5)
nRBC: 0 % (ref 0.0–0.2)

## 2022-09-25 LAB — BASIC METABOLIC PANEL
Anion gap: 12 (ref 5–15)
BUN: 86 mg/dL — ABNORMAL HIGH (ref 8–23)
CO2: 22 mmol/L (ref 22–32)
Calcium: 9.6 mg/dL (ref 8.9–10.3)
Chloride: 105 mmol/L (ref 98–111)
Creatinine, Ser: 3.41 mg/dL — ABNORMAL HIGH (ref 0.61–1.24)
GFR, Estimated: 18 mL/min — ABNORMAL LOW (ref >60)
Glucose, Bld: 105 mg/dL — ABNORMAL HIGH (ref 70–99)
Potassium: 4.8 mmol/L (ref 3.5–5.1)
Sodium: 139 mmol/L (ref 135–145)

## 2022-09-25 MED ORDER — MELATONIN 3 MG PO TABS
3.0000 mg | ORAL_TABLET | Freq: Every evening | ORAL | Status: DC | PRN
  Administered 2022-09-28: 3 mg via ORAL
  Filled 2022-09-25 (×2): qty 1

## 2022-09-25 MED ORDER — ACETAMINOPHEN 325 MG PO TABS
650.0000 mg | ORAL_TABLET | Freq: Four times a day (QID) | ORAL | Status: DC | PRN
  Administered 2022-09-29 – 2022-10-01 (×4): 650 mg via ORAL
  Filled 2022-09-25 (×5): qty 2

## 2022-09-25 MED ORDER — ACETAMINOPHEN 650 MG RE SUPP: 650.0000 mg | Freq: Four times a day (QID) | RECTAL | Status: DC | PRN

## 2022-09-25 NOTE — ED Notes (Signed)
Patient given urinal; requested he provide a urine sample when able.

## 2022-09-25 NOTE — ED Notes (Signed)
Patient took daily night medication (from home); patient took Eliquis 5 mg for his Afib and dalfampridine ER 10 mg for his MS.

## 2022-09-25 NOTE — ED Provider Notes (Signed)
[facility] DEPT Provider Note   CSN: [id number] Arrival date & time: 09/25/22  1148     History  Chief Complaint  Patient presents with   Urinary Retention    [name] is a 77 y.o. male with diastolic heart failure, A-fib on Eliquis, multiple sclerosis and incontinent at baseline, who presents with decreased urinary output.    Pt complains of urinary retention.  States he has end-stage renal disease but does not go for dialysis.  Still produces urine.  Is typically able to urinate daily, however has not urinated today.  He did urinate yesterday.  He took to 40 mg Lasix tablets prior to going to bed and then 2 more tablets 6 hours later.  Has still not urinated.  States he feels lightheaded when he walks, denies other symptoms.  Denies lower abdominal pain.  States he was here 2 days ago.  Was given IV Lasix eventually did urinate   Per chart review, recent ED note reads: Patient's creatinine went up to 3. His potassium went up to 5.6. He is not in retention and his ultrasound showed no hydro. Patient urinated about 400 cc after 40 mg IV Lasix. I offered admission since he has worsening kidney function. However patient states that he has been in the hospital so many times for his multiple sclerosis and would like to go home when possible. I told him that I would recommend admission given worsening kidney function. He does have good follow-up with cardiology. I told him that given his situation I would increase his Lasix to 80 mg twice daily for 3 days. He will need to see cardiology in the next 2 days to recheck his kidney function. If his kidney function is worsening, patient will need admission.    HPI     Home Medications Prior to Admission medications   Medication Sig Start Date End Date Taking? Authorizing Provider  apixaban (ELIQUIS) 5 MG TABS tablet Take 1 tablet (5 mg total) by mouth 2 (two) times daily. 08/10/22   Bensimhon, Shaune Pascal, MD   baclofen (LIORESAL) 10 MG tablet Take 5 mg by mouth at bedtime.    [provider]  Cholecalciferol (VITAMIN D3) 2000 UNITS TABS Take 2,000 Units by mouth daily.    [provider]  COPAXONE 40 MG/ML SOSY INJECT ONE SYRINGE SUBCUTANEOUSLY 3 TIMES A WEEK AT LEAST 48 HOURS APART. ALLOW TO WARM TO ROOM TEMP FOR 20 MINUTES. REFRIGERATE. Patient not taking: Reported on 09/23/2022 08/24/22   Penumalli, Earlean Polka, MD  COPAXONE 40 MG/ML SOSY INJECT ONE SYRINGE SUBCUTANEOUSLY 3 TIMES A WEEK AT LEAST 48 HOURS APART. ALLOW TO WARM TO ROOM TEMP FOR 20 MINUTES. REFRIGERATE. Patient taking differently: Inject 1 Syringe into the skin See admin instructions. INJECT ONE SYRINGE SUBCUTANEOUSLY 3 TIMES A WEEK (Mon/Wed/Fri) AT LEAST 48 HOURS APART. ALLOW TO WARM TO ROOM TEMP FOR 20 MINUTES. REFRIGERATE. 09/16/22   Penumalli, Earlean Polka, MD  dalfampridine 10 MG TB12 TAKE 1 TABLET BY MOUTH 2 TIMES A DAY (APPROXIMATELY 12 HOURS APART). MAY BE TAKEN WITH OR WITHOUT FOOD. DO NOT CUT OR CRUSH. STORE AT ROOM T Patient taking differently: 10 mg See admin instructions. TAKE 10 MG (1 TABLET) BY MOUTH 2 TIMES A DAY (APPROXIMATELY 12 HOURS APART). MAY BE TAKEN WITH OR WITHOUT FOOD. DO NOT CUT OR CRUSH. STORE AT ROOM TEMP. 09/16/22   Frann Rider, NP  dapagliflozin propanediol (FARXIGA) 10 MG TABS tablet Take 1 tablet (10 mg total)  by mouth daily before breakfast. Patient not taking: Reported on 09/23/2022 06/25/22   Bensimhon, Shaune Pascal, MD  folic acid (FOLVITE) 1 MG tablet Take 1 mg by mouth daily.    [provider]  furosemide (LASIX) 40 MG tablet TAKE ONE TABLET BY MOUTH DAILY EXCEPT ON MONDAYS AND FRIDAYS TAKE TWO TABLETS DAILY Patient taking differently: Take 20-40 mg by mouth See admin instructions. Take 40 mg by mouth every other day, rotating with 20 mg. May take an additional 20 mg once a day as needed for unresolved fluid or edema. 09/21/22   Bensimhon, Shaune Pascal, MD  furosemide (LASIX) 80 MG tablet Take 1  tablet (80 mg total) by mouth 2 (two) times daily for 3 days. 09/23/22 09/26/22  Drenda Freeze, MD  losartan (COZAAR) 100 MG tablet Take 1 tablet (100 mg total) by mouth daily. 08/17/22   Bensimhon, Shaune Pascal, MD  metoprolol succinate (TOPROL-XL) 25 MG 24 hr tablet Take 25 mg by mouth daily.    [provider]  Multiple Vitamins-Minerals (MULTIVITAMIN MEN) TABS Take 1 tablet by mouth daily.    [provider]  QUEtiapine (SEROQUEL) 25 MG tablet Take 25-50 mg by mouth at bedtime.    [provider]  spironolactone (ALDACTONE) 25 MG tablet Take 25 mg by mouth in the morning.    [provider]      Allergies    Farxiga [dapagliflozin]    Review of Systems   Review of Systems Review of systems Negative for f/c, abdominal/flank pain.  A 10 point review of systems was performed and is negative unless otherwise reported in HPI.  Physical Exam Updated Vital Signs BP (!) 125/59   Pulse 92   Temp 97.7 F (36.5 C) (Oral)   Resp 13   Ht 5\' 8"  (1.727 m)   Wt 76.7 kg   SpO2 100%   BMI 25.70 kg/m  Physical Exam General: Normal appearing male, lying in bed.  HEENT: PERRLA, Sclera anicteric, dry mucous membranes, trachea midline.  Cardiology: Bradycardic regular rhythm, no murmurs/rubs/gallops. BL radial and DP pulses equal bilaterally.  Resp: Normal respiratory rate and effort. CTAB, no wheezes, rhonchi, crackles.  Abd: Soft, non-tender, non-distended. No rebound tenderness or guarding.  GU: Deferred. MSK: No peripheral edema or signs of trauma. Extremities without deformity or TTP. No cyanosis or clubbing. Skin: warm, dry. No rashes or lesions. Back: No CVA tenderness Neuro: A&Ox4, CNs II-XII grossly intact. MAEs. Sensation grossly intact.  Psych: Normal mood and affect.   ED Results / Procedures / Treatments   Labs (all labs ordered are listed, but only abnormal results are displayed) Labs Reviewed  BASIC METABOLIC PANEL - Abnormal; Notable for the  following components:      Result Value   Glucose, Bld 105 (*)    BUN 86 (*)    Creatinine, Ser 3.41 (*)    GFR, Estimated 18 (*)    All other components within normal limits  CBC WITH DIFFERENTIAL/PLATELET - Abnormal; Notable for the following components:   RBC 3.77 (*)    Hemoglobin 10.7 (*)    HCT 34.1 (*)    Platelets 113 (*)    Lymphs Abs 0.6 (*)    All other components within normal limits  URINALYSIS, ROUTINE W REFLEX MICROSCOPIC  CREATININE, URINE, RANDOM  SODIUM, URINE, RANDOM  MAGNESIUM    EKG EKG Interpretation  Date/Time:  Friday September 25 2022 18:43:24 EST Ventricular Rate:  35 PR Interval:    QRS Duration: 174  QT Interval:  611 QTC Calculation: 467 R Axis:   77 Text Interpretation: Slow Atrial fibrillation Right bundle branch block Confirmed by Cindee Lame [phone]) on 09/25/2022 8:06:07 PM  Radiology US Renal  Result Date: 09/25/2022 CLINICAL DATA:  Urinary retention. EXAM: RENAL / URINARY TRACT ULTRASOUND COMPLETE COMPARISON:  09/23/2022 ultrasound, 12/27/2017 CT FINDINGS: Right Kidney: Renal measurements: 9.6 x 6.0 x 6.4 centimeters = volume: 108.9 mL. Simple cyst in the mid to UPPER pole region is 5.5 centimeters. Simple cyst in the midpole region is 1.3 centimeters. No solid mass. No hydronephrosis. Renal parenchymal echogenicity is normal. Left Kidney: Renal measurements: 10.0 x 4.6 x 4.9 centimeters = volume: 123.4 mL. Multiple cysts are present, largest measuring 4.9 centimeters. No solid mass. No hydronephrosis. Normal renal echogenicity. Bladder: Appears normal for degree of bladder distention. Other: Study quality is degraded by overlying bowel gas, patient body habitus, and limited patient positioning. IMPRESSION: Bilateral renal cysts. No hydronephrosis. Electronically Signed   By: Nolon Nations M.D.   On: 09/25/2022 13:07    Procedures Procedures    Medications Ordered in ED Medications - No data to display  ED Course/ Medical Decision  Making/ A&P                          Medical Decision Making Amount and/or Complexity of Data Reviewed Labs: ordered. Decision-making details documented in ED Course. Radiology:  Decision-making details documented in ED Course.  Risk Decision regarding hospitalization.    This patient presents to the ED for concern of decreased urinary output, this involves an extensive number of treatment options, and is a complaint that carries with it a high risk of complications and morbidity.  I considered the following differential and admission for this acute, potentially life threatening condition.   MDM:    Consider acute urinary retention or obstruction, though patient has no abdominal/flank pain. US renal ordered from triage to look for bladder distension or hydronephrosis. Also consider renal injury esp given recent known worsening renal function. Increased lasix in ED the other day to encourage urine output.  Consider electrolyte abnormalities, such as hypokalemia/hypomagnesemia due to increased Lasix or hyperkalemia due to worsening renal function. Has h/o HF but does not appear significantly volume overloaded, indeed appears dry.  On exam, patient also noted to be bradycardic into the 30s and 40s with regular rhythm.  He states his heart rate is normally in the 60s.  He denies any current lightheadedness but consider arrhythmia, bradycardia, sick sinus syndrome, and will obtain EKG.  Will also place patient on cardiac monitoring. Discussed with patient that he had been recommended admission other day but stated that he had elected to go home instead and try increasing his Lasix.  He relates that he does not think it worked.   Clinical Course as of 09/25/22 2007  Fri Sep 25, 2022  1817 Creatinine(!): 3.41 Recent baseline seems to be 2-2.3, 1/10 was 3.1, so new AKI on CKD [HN]  1817 Hemoglobin(!): 10.7 Down from baseline 11.5-13 [HN]  1817 WBC: 5.2 No leukoctyosis [HN]  1818 Platelets(!):  113 Slightly lower than baseline ~130 [HN]  1818 US Renal IMPRESSION: Bilateral renal cysts.  No hydronephrosis.   [HN]  1842 Potassium: 4.8 No hyperkalemia to cause significant arrhythami/bradycardia [HN]  1842 CO2: 22 No significant metabolic acidosis [HN]  2119 Consulted to hospitalist for admission [HN]  1846 EKG and telemetry demonstrates slow afib rate in 30s. He is alert, normotensive, does not  feel lightheaded currently. Will check Mg as well and CTM. [HN]  2006 Pt admitted to hospitalist. UA, urine lytes, and Mg pending. [HN]    Clinical Course User Index [HN] Audley Hose, MD    Labs: I Ordered, and personally interpreted labs.  The pertinent results include:  those listed above  Imaging Studies ordered: Triage provider ordered imaging studies including US renal I independently visualized and interpreted imaging. I agree with the radiologist interpretation  Additional history obtained from chart review  Cardiac Monitoring: The patient was maintained on a cardiac monitor.  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, s/p replacement of ascending thoracic aorta   Aortic dissection ([place]) 11/05/1998   S/P emergency repair of acute type A aortic dissection with resuspension of native aortic valve   Aortic dissection, thoracic ([facility])    Aortic root aneurysm ([facility])    Atrial fibrillation ([place]) 01/30/2013   Atrial fibrillation    Bilateral lower extremity edema    BPH (benign  prostatic hyperplasia)    CHF (congestive heart failure) ([place]) [birth year]/1947   2D Echo - EF 50-55%, mild-moderately dilated right ventricle, mild-moderate tricuspid valve regurgitation, moderately dilated right atrium   Depression    Dyslipidemia    Exertional shortness of breath    "for awhile now" (09/11/2013)   Hemorrhoids    History of blood transfusion    Hypertension    Mitral regurgitation 02/20/2013   Multiple sclerosis ([facility])    Neuromuscular disorder ([facility])    Pleural effusion, right, large 01/30/2013   Severe tricuspid valve regurgitation 01/30/2013   Severe tricuspid regurg by recent 2-D echo with a dilated tricuspid annulus, moderate pulmonary hypertension and biatrial enlargement    Thrombocytopenia ([place])      Medicines No orders of the defined types were placed in this encounter.   I have reviewed the patients home medicines and have made adjustments as needed  Problem List / ED Course: Problem List Items Addressed This Visit   None Visit Diagnoses     Oliguria    -  Primary   AKI (acute kidney injury) ([place])       Bradycardia                       This note was created using dictation software, which may contain spelling or grammatical errors.    Audley Hose, MD 09/25/22 2007

## 2022-09-25 NOTE — ED Notes (Signed)
Provider is aware that patient heart rate keeps dropping in the 30s. We have obtained 2 Ivs and patient is hooked up to the cardiac monitor as well  as the [facility].

## 2022-09-25 NOTE — ED Triage Notes (Signed)
Patient reports that he has end stage kidney failure.  Patient decreased urinary output since yesterday afternoon. Patient states he took his Furosemide as prescribed and it made things worse today.

## 2022-09-25 NOTE — ED Provider Triage Note (Signed)
Emergency Medicine Provider Triage Evaluation Note  [name] , a 77 y.o. male  was evaluated in triage.  Pt complains of urinary retention.  States he has end-stage renal disease but does not go for dialysis.  Still produces urine.  Is typically able to urinate daily, however has not urinated today.  He did urinate yesterday.  He took to 40 mg Lasix tablets prior to going to bed and then 2 more tablets 6 hours later.  Has still not urinated.  States he feels lightheaded when he walks, denies other symptoms.  Denies lower abdominal pain.  States he was here 2 days ago.  Was given IV Lasix eventually did urinate  Review of Systems  Positive: As above Negative: As above  Physical Exam  BP (!) 116/49 (BP Location: Left Arm)   Pulse 80   Temp 97.9 F (36.6 C) (Oral)   Resp 16   Ht 5\' 8"  (1.727 m)   Wt 76.7 kg   SpO2 100%   BMI 25.70 kg/m  Gen:   Awake, no distress   Resp:  Normal effort  MSK:   Moves extremities without difficulty  Other:    Medical Decision Making  Medically screening exam initiated at 12:20 PM.  Appropriate orders placed.  [name] was informed that the remainder of the evaluation will be completed by another provider, this initial triage assessment does not replace that evaluation, and the importance of remaining in the ED until their evaluation is complete.  Bladder scan   [name], [name] 09/25/22 1221

## 2022-09-25 NOTE — H&P (Signed)
History and Physical      [name]:[id number] DOB: 1946/03/13 DOA: 09/25/2022  PCP: Timothy Low, MD *** Patient coming from: home ***  I have personally briefly reviewed patient's old medical records in [place]  Chief Complaint: ***  HPI: [name] is a 77 y.o. male with medical history significant for *** who is admitted to [facility] on 09/25/2022 with *** after presenting from home*** to [facility] ED complaining of ***.   ***        ***  ED Course:  Vital signs in the ED were notable for the following: ***  Labs were notable for the following: ***  Per my interpretation, EKG in ED demonstrated the following:  ***  Imaging and additional notable ED work-up: ***  While in the ED, the following were administered: ***  Subsequently, the patient was admitted  ***  ***red   Review of Systems: As per HPI otherwise 10 point review of systems negative.   Past Medical History:  Diagnosis Date   Anemia    Aneurysm of aortic arch ([place]) 04/04/2012   Chronic aneurysmal dilatation of aortic arch with chronic type A aortic dissection, s/p replacement of ascending thoracic aorta   Aortic dissection ([place]) 11/05/1998   S/P emergency repair of acute type A aortic dissection with resuspension of native aortic valve   Aortic dissection, thoracic (HCC)    Aortic root aneurysm (HCC)    Atrial fibrillation ([place]) 01/30/2013   Atrial fibrillation    Bilateral lower extremity edema    BPH (benign prostatic hyperplasia)    CHF (congestive heart failure) ([place]) 10/09/46   2D Echo - EF 50-55%, mild-moderately dilated right ventricle, mild-moderate tricuspid valve regurgitation, moderately dilated right atrium   Depression    Dyslipidemia    Exertional shortness of breath    "for awhile now" (09/11/2013)   Hemorrhoids    History of blood transfusion    Hypertension    Mitral regurgitation 02/20/2013   Multiple sclerosis (HCC)    Neuromuscular  disorder (HCC)    Pleural effusion, right, large 01/30/2013   Severe tricuspid valve regurgitation 01/30/2013   Severe tricuspid regurg by recent 2-D echo with a dilated tricuspid annulus, moderate pulmonary hypertension and biatrial enlargement    Thrombocytopenia ([place])     Past Surgical History:  Procedure Laterality Date   HEMORRHOID SURGERY  2009   Internal, external hemorrhoidectomy, general anesthesia,prone position. [Other]   Open reduction and internal fixation of right distal radius fracture using [employer] distal radius volar locking plate.  07/2005   Dr Ninfa Linden   Repair of acute type A aortic dissection with resuspension of native aortic valve  10/15/1998   Dr Roxy Manns   TEE WITHOUT CARDIOVERSION N/A 02/17/2013   Procedure: TRANSESOPHAGEAL ECHOCARDIOGRAM (TEE);  Surgeon: [name], MD;  Location: [facility] ENDOSCOPY;  Service: Cardiovascular;  Laterality: N/A;    Social History:  reports that he has never smoked. He has never used smokeless tobacco. He reports current alcohol use. He reports that he does not use drugs.   Allergies  Allergen Reactions   Farxiga [Dapagliflozin] Other (See Comments)    B/P dropped too Henry and extreme vertigo    Family History  Problem Relation Age of Onset   Thyroid cancer Father        smoked   Heart attack Mother    Lung cancer Brother        smoked   Emphysema Brother  smoked    Family history reviewed and not pertinent ***   Prior to Admission medications   Medication Sig Start Date End Date Taking? Authorizing Provider  apixaban (ELIQUIS) 5 MG TABS tablet Take 1 tablet (5 mg total) by mouth 2 (two) times daily. 08/10/22   Bensimhon, Shaune Pascal, MD  baclofen (LIORESAL) 10 MG tablet Take 5 mg by mouth at bedtime.    [provider]  Cholecalciferol (VITAMIN D3) 2000 UNITS TABS Take 2,000 Units by mouth daily.    [provider]  COPAXONE 40 MG/ML SOSY INJECT ONE SYRINGE SUBCUTANEOUSLY 3 TIMES A WEEK AT  LEAST 48 HOURS APART. ALLOW TO WARM TO ROOM TEMP FOR 20 MINUTES. REFRIGERATE. Patient not taking: Reported on 09/23/2022 08/24/22   Penumalli, Earlean Polka, MD  COPAXONE 40 MG/ML SOSY INJECT ONE SYRINGE SUBCUTANEOUSLY 3 TIMES A WEEK AT LEAST 48 HOURS APART. ALLOW TO WARM TO ROOM TEMP FOR 20 MINUTES. REFRIGERATE. Patient taking differently: Inject 1 Syringe into the skin See admin instructions. INJECT ONE SYRINGE SUBCUTANEOUSLY 3 TIMES A WEEK (Mon/Wed/Fri) AT LEAST 48 HOURS APART. ALLOW TO WARM TO ROOM TEMP FOR 20 MINUTES. REFRIGERATE. 09/16/22   Penumalli, Earlean Polka, MD  dalfampridine 10 MG TB12 TAKE 1 TABLET BY MOUTH 2 TIMES A DAY (APPROXIMATELY 12 HOURS APART). MAY BE TAKEN WITH OR WITHOUT FOOD. DO NOT CUT OR CRUSH. STORE AT ROOM T Patient taking differently: 10 mg See admin instructions. TAKE 10 MG (1 TABLET) BY MOUTH 2 TIMES A DAY (APPROXIMATELY 12 HOURS APART). MAY BE TAKEN WITH OR WITHOUT FOOD. DO NOT CUT OR CRUSH. STORE AT ROOM TEMP. 09/16/22   Frann Rider, NP  dapagliflozin propanediol (FARXIGA) 10 MG TABS tablet Take 1 tablet (10 mg total) by mouth daily before breakfast. Patient not taking: Reported on 09/23/2022 06/25/22   Bensimhon, Shaune Pascal, MD  folic acid (FOLVITE) 1 MG tablet Take 1 mg by mouth daily.    [provider]  furosemide (LASIX) 40 MG tablet TAKE ONE TABLET BY MOUTH DAILY EXCEPT ON MONDAYS AND FRIDAYS TAKE TWO TABLETS DAILY Patient taking differently: Take 20-40 mg by mouth See admin instructions. Take 40 mg by mouth every other day, rotating with 20 mg. May take an additional 20 mg once a day as needed for unresolved fluid or edema. 09/21/22   Bensimhon, Shaune Pascal, MD  furosemide (LASIX) 80 MG tablet Take 1 tablet (80 mg total) by mouth 2 (two) times daily for 3 days. 09/23/22 09/26/22  Drenda Freeze, MD  losartan (COZAAR) 100 MG tablet Take 1 tablet (100 mg total) by mouth daily. 08/17/22   Bensimhon, Shaune Pascal, MD  metoprolol succinate (TOPROL-XL) 25 MG 24 hr tablet Take 25  mg by mouth daily.    [provider]  Multiple Vitamins-Minerals (MULTIVITAMIN MEN) TABS Take 1 tablet by mouth daily.    [provider]  QUEtiapine (SEROQUEL) 25 MG tablet Take 25-50 mg by mouth at bedtime.    [provider]  spironolactone (ALDACTONE) 25 MG tablet Take 25 mg by mouth in the morning.    [provider]     Objective    Physical Exam: Vitals:   09/25/22 1800 09/25/22 1830 09/25/22 1920 09/25/22 1930  BP: (!) 107/56 128/67 129/62 (!) 125/59  Pulse: (!) 36 (!) 40 (!) 42 92  Resp:   14 13  Temp:      TempSrc:      SpO2: 100% 100% 96% 100%  Weight:  Height:        General: appears to be stated age; alert, oriented Skin: warm, dry, no rash Head:  AT/[place] Mouth:  Oral mucosa membranes appear moist, normal dentition Neck: supple; trachea midline Heart:  RRR; did not appreciate any M/R/G Lungs: CTAB, did not appreciate any wheezes, rales, or rhonchi Abdomen: + BS; soft, ND, NT Vascular: 2+ pedal pulses b/l; 2+ radial pulses b/l Extremities: no peripheral edema, no muscle wasting Neuro: strength and sensation intact in upper and lower extremities b/l    *** Neuro: 5/5 strength of the proximal and distal flexors and extensors of the upper and lower extremities bilaterally; sensation intact in upper and lower extremities b/l; cranial nerves II through XII grossly intact; no pronator drift; no evidence suggestive of slurred speech, dysarthria, or facial droop; Normal muscle tone. No tremors. *** Neuro: In the setting of the patient's current mental status and associated inability to follow instructions, unable to perform full neurologic exam at this time.  As such, assessment of strength, sensation, and cranial nerves is limited at this time. Patient noted to spontaneously move all 4 extremities. No tremors.  ***    Labs on Admission: I have personally reviewed following labs and imaging studies  CBC: Recent Labs  Lab  09/23/22 1402 09/25/22 1223  WBC 5.3 5.2  NEUTROABS 4.3 3.9  HGB 11.5* 10.7*  HCT 36.6* 34.1*  MCV 90.1 90.5  PLT 137* 573*   Basic Metabolic Panel: Recent Labs  Lab 09/23/22 1402 09/25/22 1223  NA 135 139  K 5.6* 4.8  CL 103 105  CO2 23 22  GLUCOSE 102* 105*  BUN 82* 86*  CREATININE 3.14* 3.41*  CALCIUM 9.4 9.6   GFR: Estimated Creatinine Clearance: 18.1 mL/min (A) (by C-G formula based on SCr of 3.41 mg/dL (H)). Liver Function Tests: Recent Labs  Lab 09/23/22 1402  AST 54*  ALT 62*  ALKPHOS 267*  BILITOT 0.5  PROT 7.5  ALBUMIN 4.3   No results for input(s): "LIPASE", "AMYLASE" in the last 168 hours. No results for input(s): "AMMONIA" in the last 168 hours. Coagulation Profile: No results for input(s): "INR", "PROTIME" in the last 168 hours. Cardiac Enzymes: No results for input(s): "CKTOTAL", "CKMB", "CKMBINDEX", "TROPONINI" in the last 168 hours. BNP (last 3 results) No results for input(s): "PROBNP" in the last 8760 hours. HbA1C: No results for input(s): "HGBA1C" in the last 72 hours. CBG: No results for input(s): "GLUCAP" in the last 168 hours. Lipid Profile: No results for input(s): "CHOL", "HDL", "LDLCALC", "TRIG", "CHOLHDL", "LDLDIRECT" in the last 72 hours. Thyroid Function Tests: No results for input(s): "TSH", "T4TOTAL", "FREET4", "T3FREE", "THYROIDAB" in the last 72 hours. Anemia Panel: No results for input(s): "VITAMINB12", "FOLATE", "FERRITIN", "TIBC", "IRON", "RETICCTPCT" in the last 72 hours. Urine analysis:    Component Value Date/Time   COLORURINE STRAW (A) 09/23/2022 1742   APPEARANCEUR CLEAR 09/23/2022 1742   LABSPEC 1.006 09/23/2022 1742   PHURINE 6.0 09/23/2022 1742   GLUCOSEU NEGATIVE 09/23/2022 1742   HGBUR NEGATIVE 09/23/2022 1742   BILIRUBINUR NEGATIVE 09/23/2022 1742   KETONESUR NEGATIVE 09/23/2022 1742   PROTEINUR NEGATIVE 09/23/2022 1742   UROBILINOGEN 0.2 02/16/2008 1150   NITRITE NEGATIVE 09/23/2022 1742    LEUKOCYTESUR NEGATIVE 09/23/2022 1742    Radiological Exams on Admission: US Renal  Result Date: 09/25/2022 CLINICAL DATA:  Urinary retention. EXAM: RENAL / URINARY TRACT ULTRASOUND COMPLETE COMPARISON:  09/23/2022 ultrasound, 12/27/2017 CT FINDINGS: Right Kidney: Renal measurements: 9.6 x 6.0 x 6.4 centimeters =  volume: 108.9 mL. Simple cyst in the mid to UPPER pole region is 5.5 centimeters. Simple cyst in the midpole region is 1.3 centimeters. No solid mass. No hydronephrosis. Renal parenchymal echogenicity is normal. Left Kidney: Renal measurements: 10.0 x 4.6 x 4.9 centimeters = volume: 123.4 mL. Multiple cysts are present, largest measuring 4.9 centimeters. No solid mass. No hydronephrosis. Normal renal echogenicity. Bladder: Appears normal for degree of bladder distention. Other: Study quality is degraded by overlying bowel gas, patient body habitus, and limited patient positioning. IMPRESSION: Bilateral renal cysts. No hydronephrosis. Electronically Signed   By: Nolon Nations M.D.   On: 09/25/2022 13:07      Assessment/Plan    Principal Problem:   Acute renal failure (ARF) (HCC)  ***      ***          ***           ***          ***          ***          ***          ***          ***          ***          ***     ***  DVT prophylaxis: SCD's ***  Code Status: Full code*** Family Communication: none*** Disposition Plan: Per Rounding Team Consults called: none***;  Admission status: ***    I SPENT GREATER THAN 75 *** MINUTES IN CLINICAL CARE TIME/MEDICAL DECISION-MAKING IN COMPLETING THIS ADMISSION.     Martin DO [facility] [place]   09/25/2022, 8:11 PM   ***

## 2022-09-25 NOTE — ED Notes (Signed)
This RN updated patient's wife on patient status.

## 2022-09-26 ENCOUNTER — Inpatient Hospital Stay (HOSPITAL_COMMUNITY): Payer: Medicare Other

## 2022-09-26 ENCOUNTER — Encounter (HOSPITAL_COMMUNITY): Payer: Self-pay | Admitting: Internal Medicine

## 2022-09-26 ENCOUNTER — Encounter (HOSPITAL_COMMUNITY)
Admission: EM | Disposition: A | Discharge status: Skilled Nursing Facility | Payer: Self-pay | Source: Home / Self Care | Attending: Internal Medicine

## 2022-09-26 DIAGNOSIS — I50812 Chronic right heart failure: Secondary | ICD-10-CM | POA: Diagnosis not present

## 2022-09-26 DIAGNOSIS — I10 Essential (primary) hypertension: Secondary | ICD-10-CM | POA: Diagnosis present

## 2022-09-26 DIAGNOSIS — I4891 Unspecified atrial fibrillation: Secondary | ICD-10-CM

## 2022-09-26 DIAGNOSIS — N179 Acute kidney failure, unspecified: Secondary | ICD-10-CM | POA: Diagnosis not present

## 2022-09-26 DIAGNOSIS — I495 Sick sinus syndrome: Secondary | ICD-10-CM | POA: Diagnosis not present

## 2022-09-26 DIAGNOSIS — G9341 Metabolic encephalopathy: Secondary | ICD-10-CM | POA: Diagnosis not present

## 2022-09-26 DIAGNOSIS — R001 Bradycardia, unspecified: Secondary | ICD-10-CM | POA: Diagnosis not present

## 2022-09-26 DIAGNOSIS — I5032 Chronic diastolic (congestive) heart failure: Secondary | ICD-10-CM | POA: Diagnosis not present

## 2022-09-26 DIAGNOSIS — D696 Thrombocytopenia, unspecified: Secondary | ICD-10-CM | POA: Diagnosis not present

## 2022-09-26 DIAGNOSIS — D638 Anemia in other chronic diseases classified elsewhere: Secondary | ICD-10-CM | POA: Diagnosis not present

## 2022-09-26 DIAGNOSIS — I480 Paroxysmal atrial fibrillation: Secondary | ICD-10-CM | POA: Diagnosis present

## 2022-09-26 DIAGNOSIS — I071 Rheumatic tricuspid insufficiency: Secondary | ICD-10-CM

## 2022-09-26 DIAGNOSIS — I442 Atrioventricular block, complete: Secondary | ICD-10-CM | POA: Diagnosis not present

## 2022-09-26 HISTORY — PX: TEMPORARY PACEMAKER: CATH118268

## 2022-09-26 LAB — COMPREHENSIVE METABOLIC PANEL
ALT: 59 U/L — ABNORMAL HIGH (ref 0–44)
AST: 42 U/L — ABNORMAL HIGH (ref 15–41)
Albumin: 3.6 g/dL (ref 3.5–5.0)
Alkaline Phosphatase: 214 U/L — ABNORMAL HIGH (ref 38–126)
Anion gap: 11 (ref 5–15)
BUN: 94 mg/dL — ABNORMAL HIGH (ref 8–23)
CO2: 20 mmol/L — ABNORMAL LOW (ref 22–32)
Calcium: 9.6 mg/dL (ref 8.9–10.3)
Chloride: 106 mmol/L (ref 98–111)
Creatinine, Ser: 3.3 mg/dL — ABNORMAL HIGH (ref 0.61–1.24)
GFR, Estimated: 19 mL/min — ABNORMAL LOW (ref >60)
Glucose, Bld: 106 mg/dL — ABNORMAL HIGH (ref 70–99)
Potassium: 4.8 mmol/L (ref 3.5–5.1)
Sodium: 137 mmol/L (ref 135–145)
Total Bilirubin: 0.7 mg/dL (ref 0.3–1.2)
Total Protein: 6.6 g/dL (ref 6.5–8.1)

## 2022-09-26 LAB — CBC WITH DIFFERENTIAL/PLATELET
Abs Immature Granulocytes: 0.03 10*3/uL (ref 0.00–0.07)
Basophils Absolute: 0 10*3/uL (ref 0.0–0.1)
Basophils Relative: 1 %
Eosinophils Absolute: 0.1 10*3/uL (ref 0.0–0.5)
Eosinophils Relative: 1 %
HCT: 35.7 % — ABNORMAL LOW (ref 39.0–52.0)
Hemoglobin: 11.2 g/dL — ABNORMAL LOW (ref 13.0–17.0)
Immature Granulocytes: 1 %
Lymphocytes Relative: 10 %
Lymphs Abs: 0.6 10*3/uL — ABNORMAL LOW (ref 0.7–4.0)
MCH: 28.4 pg (ref 26.0–34.0)
MCHC: 31.4 g/dL (ref 30.0–36.0)
MCV: 90.6 fL (ref 80.0–100.0)
Monocytes Absolute: 0.7 10*3/uL (ref 0.1–1.0)
Monocytes Relative: 12 %
Neutro Abs: 4.9 10*3/uL (ref 1.7–7.7)
Neutrophils Relative %: 75 %
Platelets: 119 10*3/uL — ABNORMAL LOW (ref 150–400)
RBC: 3.94 MIL/uL — ABNORMAL LOW (ref 4.22–5.81)
RDW: 14.1 % (ref 11.5–15.5)
WBC: 6.4 10*3/uL (ref 4.0–10.5)
nRBC: 0 % (ref 0.0–0.2)

## 2022-09-26 LAB — MRSA NEXT GEN BY PCR, NASAL: MRSA by PCR Next Gen: NOT DETECTED

## 2022-09-26 LAB — BASIC METABOLIC PANEL
Anion gap: 11 (ref 5–15)
BUN: 90 mg/dL — ABNORMAL HIGH (ref 8–23)
CO2: 21 mmol/L — ABNORMAL LOW (ref 22–32)
Calcium: 9.3 mg/dL (ref 8.9–10.3)
Chloride: 105 mmol/L (ref 98–111)
Creatinine, Ser: 3.27 mg/dL — ABNORMAL HIGH (ref 0.61–1.24)
GFR, Estimated: 19 mL/min — ABNORMAL LOW (ref >60)
Glucose, Bld: 103 mg/dL — ABNORMAL HIGH (ref 70–99)
Potassium: 5.2 mmol/L — ABNORMAL HIGH (ref 3.5–5.1)
Sodium: 137 mmol/L (ref 135–145)

## 2022-09-26 LAB — MAGNESIUM
Magnesium: 2.5 mg/dL — ABNORMAL HIGH (ref 1.7–2.4)
Magnesium: 2.4 mg/dL (ref 1.7–2.4)

## 2022-09-26 LAB — CK: Total CK: 37 U/L — ABNORMAL LOW (ref 49–397)

## 2022-09-26 LAB — BRAIN NATRIURETIC PEPTIDE: B Natriuretic Peptide: 418.7 pg/mL — ABNORMAL HIGH (ref 0.0–100.0)

## 2022-09-26 LAB — TSH: TSH: 3.238 u[IU]/mL (ref 0.350–4.500)

## 2022-09-26 LAB — APTT: aPTT: 128 seconds — ABNORMAL HIGH (ref 24–36)

## 2022-09-26 LAB — PHOSPHORUS: Phosphorus: 5 mg/dL — ABNORMAL HIGH (ref 2.5–4.6)

## 2022-09-26 SURGERY — TEMPORARY PACEMAKER
Anesthesia: LOCAL

## 2022-09-26 MED ORDER — SODIUM CHLORIDE 0.9 % IV BOLUS
1000.0000 mL | Freq: Once | INTRAVENOUS | Status: AC
  Administered 2022-09-26: 1000 mL via INTRAVENOUS

## 2022-09-26 MED ORDER — FENTANYL CITRATE (PF) 100 MCG/2ML IJ SOLN
INTRAMUSCULAR | Status: DC | PRN
  Administered 2022-09-26: 25 ug via INTRAVENOUS

## 2022-09-26 MED ORDER — DALFAMPRIDINE ER 10 MG PO TB12
10.0000 mg | ORAL_TABLET | Freq: Once | ORAL | Status: AC
  Administered 2022-09-26: 10 mg via ORAL
  Filled 2022-09-26: qty 1

## 2022-09-26 MED ORDER — DALFAMPRIDINE ER 10 MG PO TB12: 10.0000 mg | ORAL_TABLET | Freq: Two times a day (BID) | ORAL | Status: DC

## 2022-09-26 MED ORDER — FENTANYL CITRATE (PF) 100 MCG/2ML IJ SOLN
INTRAMUSCULAR | Status: AC
  Filled 2022-09-26: qty 2

## 2022-09-26 MED ORDER — QUETIAPINE FUMARATE 25 MG PO TABS
25.0000 mg | ORAL_TABLET | Freq: Every day | ORAL | Status: DC
  Administered 2022-09-26 – 2022-09-29 (×4): 25 mg via ORAL
  Filled 2022-09-26 (×4): qty 1

## 2022-09-26 MED ORDER — LACTATED RINGERS IV SOLN: INTRAVENOUS | Status: AC

## 2022-09-26 MED ORDER — MIDAZOLAM HCL 2 MG/2ML IJ SOLN
INTRAMUSCULAR | Status: DC | PRN
  Administered 2022-09-26: 1 mg via INTRAVENOUS

## 2022-09-26 MED ORDER — LIDOCAINE HCL (PF) 1 % IJ SOLN
INTRAMUSCULAR | Status: DC | PRN
  Administered 2022-09-26: 5 mL

## 2022-09-26 MED ORDER — APIXABAN 5 MG PO TABS: 5.0000 mg | ORAL_TABLET | Freq: Two times a day (BID) | ORAL | Status: DC

## 2022-09-26 MED ORDER — BACLOFEN 5 MG HALF TABLET
5.0000 mg | ORAL_TABLET | Freq: Every day | ORAL | Status: DC
  Administered 2022-09-26: 5 mg via ORAL
  Filled 2022-09-26: qty 1

## 2022-09-26 MED ORDER — HEPARIN (PORCINE) IN NACL 1000-0.9 UT/500ML-% IV SOLN
INTRAVENOUS | Status: AC
  Filled 2022-09-26: qty 500

## 2022-09-26 MED ORDER — HEPARIN (PORCINE) 25000 UT/250ML-% IV SOLN
950.0000 [IU]/h | INTRAVENOUS | Status: DC
  Administered 2022-09-26: 1100 [IU]/h via INTRAVENOUS
  Filled 2022-09-26: qty 250

## 2022-09-26 MED ORDER — CHLORHEXIDINE GLUCONATE CLOTH 2 % EX PADS
6.0000 | MEDICATED_PAD | Freq: Every day | CUTANEOUS | Status: DC
  Administered 2022-09-26 – 2022-10-01 (×6): 6 via TOPICAL

## 2022-09-26 MED ORDER — MIDAZOLAM HCL 2 MG/2ML IJ SOLN
INTRAMUSCULAR | Status: AC
  Filled 2022-09-26: qty 2

## 2022-09-26 MED ORDER — HEPARIN (PORCINE) IN NACL 1000-0.9 UT/500ML-% IV SOLN
INTRAVENOUS | Status: DC | PRN
  Administered 2022-09-26: 500 mL

## 2022-09-26 MED ORDER — LIDOCAINE HCL (PF) 1 % IJ SOLN
INTRAMUSCULAR | Status: AC
  Filled 2022-09-26: qty 30

## 2022-09-26 SURGICAL SUPPLY — 11 items
CABLE ADAPT PACING TEMP 12FT (ADAPTER) IMPLANT
CATH S G BIP PACING (CATHETERS) IMPLANT
PACK CARDIAC CATHETERIZATION (CUSTOM PROCEDURE TRAY) ×2 IMPLANT
PINNACLE LONG 6F 25CM (SHEATH) ×1
SHEATH INTRO PINNACLE 6F 25CM (SHEATH) IMPLANT
SHEATH PINNACLE 6F 10CM (SHEATH) IMPLANT
SHEATH PROBE COVER 6X72 (BAG) IMPLANT
SLEEVE REPOSITIONING LENGTH 30 (MISCELLANEOUS) IMPLANT
TUBING CIL FLEX 10 FLL-RA (TUBING) ×2 IMPLANT
WIRE MICRO SET SILHO 5FR 7 (SHEATH) IMPLANT
WIRE PACING TEMP ST TIP 5 (CATHETERS) IMPLANT

## 2022-09-26 NOTE — ED Notes (Signed)
Cardiology paged by provider

## 2022-09-26 NOTE — Consult Note (Signed)
Renal Service Consult Note [name]  [name] 09/26/2022 Timothy Blazing, MD Requesting Physician: Timothy. Sloan Henry, [name].   Reason for Consult: Renal failure HPI: The patient is a 77 y.o. year-old w/ PMH as below who presented 1/12 w/ c/o decreased UOP, taking home lasix and it was "making things worse". In ED creat was 3.1 (baseline 2.1), HR was 30s- 40s, BP in the 100s- 120s, RR 14-18 and no hypoxia. HB 10.7. EKG showed atrial fib w/ HR 35.  Pt seen by cardiology this am who dx'd atrial fib w/ slow VR in pt taking home metoprolol xl. Pt was tx'd from [name] ED to [name] for urgent temporary pacemaker wire placement. We are asked to see for renal failure.   Pt w/ hx of R HF, chronic diast HF and severe TR, also progressive MS and hx surgical repair of ascending TAA done in year 2000.    Pt seen in room, sp pacer placement, feeling better. No SOB or cough or fever or CP.    ROS - denies CP, no joint pain, no HA, no blurry vision, no rash, no diarrhea, no nausea/ vomiting, no dysuria, no difficulty voiding   Past Medical History  Past Medical History:  Diagnosis Date   Anemia    Aneurysm of aortic arch ([place]) 04/04/2012   Chronic aneurysmal dilatation of aortic arch with chronic type A aortic dissection, s/p replacement of ascending thoracic aorta   Aortic dissection ([place]) 11/05/1998   S/P emergency repair of acute type A aortic dissection with resuspension of native aortic valve   Aortic dissection, thoracic (HCC)    Aortic root aneurysm (HCC)    Atrial fibrillation ([place]) 01/30/2013   Atrial fibrillation    Bilateral lower extremity edema    BPH (benign prostatic hyperplasia)    CHF (congestive heart failure) ([place]) 1947/02/12   2D Echo - EF 50-55%, mild-moderately dilated right ventricle, mild-moderate tricuspid valve regurgitation, moderately dilated right atrium   Depression    Dyslipidemia    Exertional shortness of breath    "for awhile now" (09/11/2013)    Hemorrhoids    History of blood transfusion    Hypertension    Mitral regurgitation 02/20/2013   Multiple sclerosis (HCC)    Neuromuscular disorder (HCC)    Pleural effusion, right, large 01/30/2013   Severe tricuspid valve regurgitation 01/30/2013   Severe tricuspid regurg by recent 2-D echo with a dilated tricuspid annulus, moderate pulmonary hypertension and biatrial enlargement    Thrombocytopenia [facility])    Past Surgical History  Past Surgical History:  Procedure Laterality Date   HEMORRHOID SURGERY  2009   Internal, external hemorrhoidectomy, general anesthesia,prone position. [Other]   Open reduction and internal fixation of right distal radius fracture using [employer] distal radius volar locking plate.  07/2005   [name]   Repair of acute type A aortic dissection with resuspension of native aortic valve  10/15/1998   [name]   TEE WITHOUT CARDIOVERSION N/A 02/17/2013   Procedure: TRANSESOPHAGEAL ECHOCARDIOGRAM (TEE);  Surgeon: Timothy Klein, MD;  Location: [facility] ENDOSCOPY;  Service: Cardiovascular;  Laterality: N/A;   Family History  Family History  Problem Relation Age of Onset   Thyroid cancer Father        smoked   Heart attack Mother    Lung cancer Brother        smoked   Emphysema Brother        smoked   Social History  reports that he has never  smoked. He has never used smokeless tobacco. He reports current alcohol use. He reports that he does not use drugs. Allergies  Allergies  Allergen Reactions   Farxiga [Dapagliflozin] Other (See Comments)    B/P dropped too low and extreme vertigo   Home medications Prior to Admission medications   Medication Sig Start Date End Date Taking? Authorizing Provider  apixaban (ELIQUIS) 5 MG TABS tablet Take 1 tablet (5 mg total) by mouth 2 (two) times daily. 08/10/22  Yes [name], Timothy Pascal, MD  baclofen (LIORESAL) 10 MG tablet Take 5 mg by mouth at bedtime.    [provider]  Cholecalciferol (VITAMIN D3) 2000  UNITS TABS Take 2,000 Units by mouth daily.    [provider]  COPAXONE 40 MG/ML SOSY INJECT ONE SYRINGE SUBCUTANEOUSLY 3 TIMES A WEEK AT LEAST 48 HOURS APART. ALLOW TO WARM TO ROOM TEMP FOR 20 MINUTES. REFRIGERATE. Patient not taking: Reported on 09/23/2022 08/24/22   [name], Timothy Polka, MD  COPAXONE 40 MG/ML SOSY INJECT ONE SYRINGE SUBCUTANEOUSLY 3 TIMES A WEEK AT LEAST 48 HOURS APART. ALLOW TO WARM TO ROOM TEMP FOR 20 MINUTES. REFRIGERATE. Patient taking differently: Inject 1 Syringe into the skin See admin instructions. INJECT ONE SYRINGE SUBCUTANEOUSLY 3 TIMES A WEEK (Mon/Wed/Fri) AT LEAST 48 HOURS APART. ALLOW TO WARM TO ROOM TEMP FOR 20 MINUTES. REFRIGERATE. 09/16/22   [name], Timothy Polka, MD  dalfampridine 10 MG TB12 TAKE 1 TABLET BY MOUTH 2 TIMES A DAY (APPROXIMATELY 12 HOURS APART). MAY BE TAKEN WITH OR WITHOUT FOOD. DO NOT CUT OR CRUSH. STORE AT ROOM T Patient taking differently: 10 mg See admin instructions. TAKE 10 MG (1 TABLET) BY MOUTH 2 TIMES A DAY (APPROXIMATELY 12 HOURS APART). MAY BE TAKEN WITH OR WITHOUT FOOD. DO NOT CUT OR CRUSH. STORE AT ROOM TEMP. 09/16/22   Timothy Rider, [name]  dapagliflozin propanediol (FARXIGA) 10 MG TABS tablet Take 1 tablet (10 mg total) by mouth daily before breakfast. Patient not taking: Reported on 09/23/2022 06/25/22   [name], Timothy Pascal, MD  folic acid (FOLVITE) 1 MG tablet Take 1 mg by mouth daily.    [provider]  furosemide (LASIX) 40 MG tablet TAKE ONE TABLET BY MOUTH DAILY EXCEPT ON MONDAYS AND FRIDAYS TAKE TWO TABLETS DAILY Patient taking differently: Take 20-40 mg by mouth See admin instructions. Take 40 mg by mouth every other day, rotating with 20 mg. May take an additional 20 mg once a day as needed for unresolved fluid or edema. 09/21/22   [name], Timothy Pascal, MD  furosemide (LASIX) 80 MG tablet Take 1 tablet (80 mg total) by mouth 2 (two) times daily for 3 days. 09/23/22 09/26/22  Timothy Freeze, MD  losartan (COZAAR) 100 MG  tablet Take 1 tablet (100 mg total) by mouth daily. 08/17/22   [name], Timothy Pascal, MD  metoprolol succinate (TOPROL-XL) 25 MG 24 hr tablet Take 25 mg by mouth daily.    [provider]  Multiple Vitamins-Minerals (MULTIVITAMIN MEN) TABS Take 1 tablet by mouth daily.    [provider]  QUEtiapine (SEROQUEL) 25 MG tablet Take 25-50 mg by mouth at bedtime.    [provider]  spironolactone (ALDACTONE) 25 MG tablet Take 25 mg by mouth in the morning.    [provider]     Vitals:   09/26/22 1400 09/26/22 1500 09/26/22 1600 09/26/22 1611  BP: 130/68 137/82 (!) 120/58   Pulse: 70 70 70   Resp: 11 18 13  Temp:    97.9 F (36.6 C)  TempSrc:    Oral  SpO2: 93% 96% 100%   Weight:      Height:       Exam Gen alert, no distress, [place] O2 , eating dinner  R neck CVC introducer No rash, cyanosis or gangrene Sclera anicteric, throat clear  No jvd or bruits Chest clear bilat to bases, no rales/ wheezing RRR no RG Abd soft ntnd no mass or ascites +bs GU normal male MS no joint effusions or deformity Ext trace LE edema, no wounds or ulcers Neuro is alert, Ox 3 , nf       Home meds include - eliquis, baclofen, copaxone, dalfampridine, dapagliflozin, lasix 40-80mg  , losartan 100 qd, metoprolol xl 25 qd, seroquel, aldactone 25 qd, prns/ vits/ supps      Date   Creat  eGFR     2009- 2013  1.05- 1.41     2014  4.26 >> 3.46 AKI episode     2015  2.31 >> 1.47 AKI episode     2016  1.14- 1.40     2018  1.23- 1.53     2019  1.49- 1.52     2021  2.08- 2.43     Oct -nov 2022 1.90- 2.03 34-37 ml/min     07/16/22  2.18  31 ml/min          09/23/22  3.14  20 ml/min            09/25/22  3.41       09/26/21  3.30  19 ml/min        UA 1/10 - negative       CXR - no active disease       Renal US - 9.6/ 10 cm kidneys w/o hydro, mild ^echo      Na 137  K 4.8  CO2 20  BUN 94  Creat 3.30   Assessment/ Plan: AKI on CKD 3b - b/l creatinine 2.2 from Nov 2023, eGFR 31  ml/min. F/b [facility] at [facility]. Creat 3.1 here on admission in setting of bradycardia and decreased UOP. Pt seen by cardiology and is now SP temp pacer placement. Pt is euvolemic. Renal US neg for obstruction and UA is negative. HR's are normal now. Pt got 1 L bolus NS and some IVF"s earlier today. Home ARB is on hold appropriately w/ AKI.  Agree w/ current mgmnt. Will follow.  Severe bradycardia - medication related vs primary. Is now sp temp pacer by cardiology.  HTN - BP's stable, no BP lowering meds at this time needed Chronic diast CHF / severe TR / RHF - CXR clear, euvolemic on exam Multiple sclerosis    Kelly Splinter  MD 09/26/2022, 4:33 PM Recent Labs  Lab 09/23/22 1402 09/25/22 1223 09/26/22 0529  HGB 11.5* 10.7* 11.2*  ALBUMIN 4.3  --  3.6  CALCIUM 9.4 9.6 9.6  PHOS  --   --  5.0*  CREATININE 3.14* 3.41* 3.30*  K 5.6* 4.8 4.8   Inpatient medications:  baclofen  5 mg Oral QHS   Chlorhexidine Gluconate Cloth  6 each Topical Daily   QUEtiapine  25 mg Oral QHS    heparin 1,100 Units/hr (09/26/22 1621)   acetaminophen **OR** acetaminophen, melatonin

## 2022-09-26 NOTE — ED Notes (Signed)
[facility] at pt bedside for transfer to [facility]

## 2022-09-26 NOTE — Progress Notes (Signed)
[facility] for heparin Indication: atrial fibrillation  Allergies  Allergen Reactions   Farxiga [Dapagliflozin] Other (See Comments)    B/P dropped too low and extreme vertigo    Patient Measurements: Height: 5\' 8"  (172.7 cm) Weight: 76.7 kg (169 lb) IBW/kg (Calculated) : 68.4 Heparin Dosing Weight: 76kg  Vital Signs: Temp: 97.8 F (36.6 C) (01/13 2011) Temp Source: Oral (01/13 2011) BP: 133/62 (01/13 2000) Pulse Rate: 69 (01/13 2011)  Labs: Recent Labs    09/25/22 1223 09/26/22 0529 09/26/22 2119  HGB 10.7* 11.2*  --   HCT 34.1* 35.7*  --   PLT 113* 119*  --   APTT  --   --  128*  CREATININE 3.41* 3.30*  --   CKTOTAL  --  37*  --      Estimated Creatinine Clearance: 18.7 mL/min (A) (by C-G formula based on SCr of 3.3 mg/dL (H)).   Medical History: Past Medical History:  Diagnosis Date   Anemia    Aneurysm of aortic arch ([place]) 04/04/2012   Chronic aneurysmal dilatation of aortic arch with chronic type A aortic dissection, s/p replacement of ascending thoracic aorta   Aortic dissection ([place]) 11/05/1998   S/P emergency repair of acute type A aortic dissection with resuspension of native aortic valve   Aortic dissection, thoracic (HCC)    Aortic root aneurysm (HCC)    Atrial fibrillation ([place]) 01/30/2013   Atrial fibrillation    Bilateral lower extremity edema    BPH (benign prostatic hyperplasia)    CHF (congestive heart failure) ([place]) [birth year] 1946   2D Echo - EF 50-55%, mild-moderately dilated right ventricle, mild-moderate tricuspid valve regurgitation, moderately dilated right atrium   Depression    Dyslipidemia    Exertional shortness of breath    "for awhile now" (09/11/2013)   Hemorrhoids    History of blood transfusion    Hypertension    Mitral regurgitation 02/20/2013   Multiple sclerosis (HCC)    Neuromuscular disorder (HCC)    Pleural effusion, right, large 01/30/2013   Severe tricuspid valve regurgitation  01/30/2013   Severe tricuspid regurg by recent 2-D echo with a dilated tricuspid annulus, moderate pulmonary hypertension and biatrial enlargement    Thrombocytopenia ([place])      Assessment: 60 yoM admitted with bradycardia s/p temp pacer. Pt is on apixaban PTA for hx AF, pharmacy to dose IV heparin.  aPTT 128 (supratherapeutic)  - D/w RN - some slight oozing around line after movement but controlled   Goal of Therapy:  Heparin level 0.3-0.7 units/ml aPTT 66-102 seconds Monitor platelets by anticoagulation protocol: Yes   Plan:  Reduce heparin to 950 u/hr  Check aPTT in 8h  -f/u s/sx bleeding   Wilson Singer, PharmD Clinical Pharmacist 09/26/2022 10:16 PM

## 2022-09-26 NOTE — Consult Note (Incomplete)
Electrophysiology Consultation   Patient ID: [name] MRN: [id number]; DOB: Mar 09, 1946  Admit date: 09/25/2022 Date of Consult: 09/27/2022  PCP:  [name], [place] [facility] Cardiologist:  None   {  Patient Profile:   [name] is a 77 y.o. male with a hx of Type A Aortic dissection in 2000 s/p hemiarch replacement, severe multiple sclerosis, chronic diastolic HF and RV failure with severe TR, chronic AF on Eliquis who is being seen 09/27/2022 for the evaluation of AF with slow ventricular response at the request of Dr Johney Frame.  History of Present Illness:   [name] presented to [facility] with a slow ventricular response in the 20s associated with poor urine output. In the ER, his CR was significantly elevated with a HR 33. He was transferred to [facility] for temp wire placement in an effort to improve his renal function.   Overnight a TVP was placed. His kidney function has improved after TVP and patient reports significant diuresis.  He did have some oozing around the TVP.   Past Medical History:  Diagnosis Date   Anemia    Aneurysm of aortic arch ([place]) 04/04/2012   Chronic aneurysmal dilatation of aortic arch with chronic type A aortic dissection, s/p replacement of ascending thoracic aorta   Aortic dissection () 11/05/1998   S/P emergency repair of acute type A aortic dissection with resuspension of native aortic valve   Aortic dissection, thoracic (HCC)    Aortic root aneurysm (HCC)    Atrial fibrillation ([place]) 01/30/2013   Atrial fibrillation    Bilateral lower extremity edema    BPH (benign prostatic hyperplasia)    CHF (congestive heart failure) ([place]) 02/12/47   2D Echo - EF 50-55%, mild-moderately dilated right ventricle, mild-moderate tricuspid valve regurgitation, moderately dilated right atrium   Depression    Dyslipidemia    Exertional shortness of breath    "for awhile now" (09/11/2013)   Hemorrhoids    History  of blood transfusion    Hypertension    Mitral regurgitation 02/20/2013   Multiple sclerosis (HCC)    Neuromuscular disorder (HCC)    Pleural effusion, right, large 01/30/2013   Severe tricuspid valve regurgitation 01/30/2013   Severe tricuspid regurg by recent 2-D echo with a dilated tricuspid annulus, moderate pulmonary hypertension and biatrial enlargement    Thrombocytopenia ([place])     Past Surgical History:  Procedure Laterality Date   HEMORRHOID SURGERY  2009   Internal, external hemorrhoidectomy, general anesthesia,prone position. [Other]   Open reduction and internal fixation of right distal radius fracture using [employer] distal radius volar locking plate.  07/2005   Dr Ninfa Linden   Repair of acute type A aortic dissection with resuspension of native aortic valve  10/15/1998   Dr Roxy Manns   TEE WITHOUT CARDIOVERSION N/A 02/17/2013   Procedure: TRANSESOPHAGEAL ECHOCARDIOGRAM (TEE);  Surgeon: [name], MD;  Location: [facility] ENDOSCOPY;  Service: Cardiovascular;  Laterality: N/A;       Inpatient Medications: Scheduled Meds:  Chlorhexidine Gluconate Cloth  6 each Topical Daily   cholecalciferol  2,000 Units Oral Daily   folic acid  1 mg Oral Daily   QUEtiapine  25 mg Oral QHS   Continuous Infusions:  heparin Stopped (09/27/22 0057)   PRN Meds: acetaminophen **OR** acetaminophen, baclofen, melatonin  Allergies:    Allergies  Allergen Reactions   Farxiga [Dapagliflozin] Other (See Comments)    B/P dropped too low and extreme vertigo    Social  History:   Social History   Socioeconomic History   Marital status: Married    Spouse name: Debbie   Number of children: 2   Years of education: Ph.D   Highest education level: Not on file  Occupational History   Occupation: Retired Professor    Fish farm manager: [employer]    Comment: Statistics  Tobacco Use   Smoking status: Never   Smokeless tobacco: Never  Vaping Use   Vaping Use: Never used  Substance and Sexual  Activity   Alcohol use: Yes    Comment: 09/11/2013 "might have a drink once/month"   Drug use: No   Sexual activity: Not Currently  Other Topics Concern   Not on file  Social History Narrative   Patient lives at home with spouse.    Caffeine Use: eat a lot of chocolate, sodas, coffee and tea occasionally   Right Handed   Social Determinants of Health   Financial Resource Strain: Low Risk  (07/07/2018)   Overall Financial Resource Strain (CARDIA)    Difficulty of Paying Living Expenses: Not hard at all  Food Insecurity: No Food Insecurity (07/07/2018)   Hunger Vital Sign    Worried About Running Out of Food in the Last Year: Never true    Ran Out of Food in the Last Year: Never true  Transportation Needs: No Transportation Needs (07/07/2018)   PRAPARE - Hydrologist (Medical): No    Lack of Transportation (Non-Medical): No  Physical Activity: Not on file  Stress: Not on file  Social Connections: Not on file  Intimate Partner Violence: Not on file    Family History:    Family History  Problem Relation Age of Onset   Thyroid cancer Father        smoked   Heart attack Mother    Lung cancer Brother        smoked   Emphysema Brother        smoked     ROS:  Please see the history of present illness.   All other ROS reviewed and negative.     Physical Exam/Data:   Vitals:   09/27/22 0600 09/27/22 0700 09/27/22 0752 09/27/22 0800  BP: 134/67 (!) 120/56  135/82  Pulse: 70 70  70  Resp: 17 15  18   Temp:   98.6 F (37 C)   TempSrc:   Oral   SpO2: 100% 100%  99%  Weight:      Height:        Intake/Output Summary (Last 24 hours) at 09/27/2022 0917 Last data filed at 09/27/2022 0600 Gross per 24 hour  Intake 806.09 ml  Output 1350 ml  Net -543.91 ml      09/25/2022   12:06 PM 06/20/2021    2:55 PM 05/27/2021   12:28 PM  Last 3 Weights  Weight (lbs) 169 lb 180 lb 6.4 oz 169 lb  Weight (kg) 76.658 kg 81.829 kg 76.658 kg     Body  mass index is 25.7 kg/m.    General:  Well nourished, well developed, in no acute distress HEENT: normal Neck: no JVD. TVP exiting R IJ with very minimal bleeding near venotomy.  Vascular: No carotid bruits; Distal pulses 2+ bilaterally Cardiac:  normal S1, S2; paced  Lungs:  clear to auscultation bilaterally, no wheezing, rhonchi or rales  Abd: soft, nontender, no hepatomegaly  Ext: no edema Musculoskeletal:  Some muscle spasticity in LE's. Skin: warm and dry  Neuro:  CNs 2-12 intact, no focal abnormalities noted Psych:  Normal affect    EKG:  The EKG was personally reviewed and demonstrates:  AF w/ V rate 33bpm.     Relevant CV Studies:  07/2022 Echo - EF 55  Laboratory Data:  High Sensitivity Troponin:  No results for input(s): "TROPONINIHS" in the last 720 hours.   Chemistry Recent Labs  Lab 09/25/22 1223 09/26/22 0529 09/27/22 0620  NA 139 137 140  K 4.8 4.8 4.7  CL 105 106 110  CO2 22 20* 18*  GLUCOSE 105* 106* 101*  BUN 86* 94* 76*  CREATININE 3.41* 3.30* 2.59*  CALCIUM 9.6 9.6 9.6  MG  --  2.5* 2.2  GFRNONAA 18* 19* 25*  ANIONGAP 12 11 12     Recent Labs  Lab 09/23/22 1402 09/26/22 0529  PROT 7.5 6.6  ALBUMIN 4.3 3.6  AST 54* 42*  ALT 62* 59*  ALKPHOS 267* 214*  BILITOT 0.5 0.7   Lipids No results for input(s): "CHOL", "TRIG", "HDL", "LABVLDL", "LDLCALC", "CHOLHDL" in the last 168 hours.  Hematology Recent Labs  Lab 09/25/22 1223 09/26/22 0529 09/27/22 0620  WBC 5.2 6.4 8.8  RBC 3.77* 3.94* 3.97*  HGB 10.7* 11.2* 11.3*  HCT 34.1* 35.7* 35.0*  MCV 90.5 90.6 88.2  MCH 28.4 28.4 28.5  MCHC 31.4 31.4 32.3  RDW 14.0 14.1 13.8  PLT 113* 119* 116*   Thyroid  Recent Labs  Lab 09/26/22 1136  TSH 3.238    BNP Recent Labs  Lab 09/23/22 1402 09/26/22 0529 09/27/22 0620  BNP 220.4* 418.7* 537.9*    DDimer No results for input(s): "DDIMER" in the last 168 hours.   Radiology/Studies:  CARDIAC CATHETERIZATION  Result Date:  09/26/2022 Successful placement of transvenous single lead pacemaker in RV apex via RIJ access   DG Chest Port 1 View  Result Date: 09/26/2022 CLINICAL DATA:  Urinary retention EXAM: PORTABLE CHEST 1 VIEW COMPARISON:  09/23/2022 FINDINGS: Cardiac shadow is enlarged. Postsurgical changes are again seen. Lungs are well aerated bilaterally. No bony abnormality is seen. IMPRESSION: No acute abnormality noted. Electronically Signed   By: Inez Catalina M.D.   On: 09/26/2022 03:44   US Renal  Result Date: 09/25/2022 CLINICAL DATA:  Urinary retention. EXAM: RENAL / URINARY TRACT ULTRASOUND COMPLETE COMPARISON:  09/23/2022 ultrasound, 12/27/2017 CT FINDINGS: Right Kidney: Renal measurements: 9.6 x 6.0 x 6.4 centimeters = volume: 108.9 mL. Simple cyst in the mid to UPPER pole region is 5.5 centimeters. Simple cyst in the midpole region is 1.3 centimeters. No solid mass. No hydronephrosis. Renal parenchymal echogenicity is normal. Left Kidney: Renal measurements: 10.0 x 4.6 x 4.9 centimeters = volume: 123.4 mL. Multiple cysts are present, largest measuring 4.9 centimeters. No solid mass. No hydronephrosis. Normal renal echogenicity. Bladder: Appears normal for degree of bladder distention. Other: Study quality is degraded by overlying bowel gas, patient body habitus, and limited patient positioning. IMPRESSION: Bilateral renal cysts. No hydronephrosis. Electronically Signed   By: Nolon Nations M.D.   On: 09/25/2022 13:07   US Renal  Result Date: 09/23/2022 CLINICAL DATA:  Acute kidney insufficiency EXAM: RENAL / URINARY TRACT ULTRASOUND COMPLETE COMPARISON:  09/03/2021 renal ultrasound. Older CT scans 12/27/2017 and older FINDINGS: Right Kidney: Renal measurements: 9.9 x 6.9 x 5.5 = volume: 198.7 mL. Mild global atrophy and thinning of the parenchyma. There is a exophytic midportion anechoic structure measuring 4.5 x 3.6 x 4.5 cm consistent with a benign cyst. This was seen previously. No significant  collecting  system dilatation or perinephric fluid. Left Kidney: Renal measurements: 12.5 x 4.8 x 4.7 = volume: 146.8 mL. Mild global parenchymal atrophy. No collecting system dilatation. Simple appearing cysts also seen in the left kidney measuring up to 4.9 x 4.9 x 4.0 cm and a second focus measuring 4.0 x 3.9 x 3.6 cm. This was seen previously. Bladder: Trabeculated thickened wall of the bladder. Bladder is mildly distended. Other: Limited visualization of the aorta and IVC. IMPRESSION: Mild renal atrophy. No collecting system dilatation. Bilateral renal cysts which appear simple. Electronically Signed   By: Jill Side M.D.   On: 09/23/2022 18:18   DG Chest 2 View  Result Date: 09/23/2022 CLINICAL DATA:  Weight gain. Congestive heart failure. Shortness of breath. End-stage renal failure. EXAM: CHEST - 2 VIEW COMPARISON:  AP chest 03/01/2020 FINDINGS: Status post median sternotomy. Cardiac silhouette is again mildly to moderately enlarged. Mediastinal contours are within normal limits. The lungs are clear. No pleural effusion or pneumothorax. Mild lateral lower lung horizontal linear subsegmental atelectasis versus scarring. No pleural effusion or pneumothorax. Mild multilevel degenerative disc changes of the thoracic spine. IMPRESSION: 1. No acute lung process. 2. Mild to moderate cardiomegaly. Electronically Signed   By: Yvonne Kendall M.D.   On: 09/23/2022 12:36     Assessment and Plan:   Mr Mctigue is a 77yo man with MS, type a dissection s/p repair, severe TR who presented with worsening renal function in setting of AF with a slow V response in the low 30s.  #AF w/ slow ventricular response Symptomatic with worsening renal function. This has now started to improve after TVP. He is dependent on the TVP this morning at VVI 30. Hold nodal blockers Heparin on hold until R IJ site stops bleeding He will likely require pacemaker implant. I would like his kidney function to completely stabilize prior to implant  to minimize bleeding risk associated with his uremia. Possibly a mid-week implant.   EP will follow along.  For questions or updates, please contact [place] Please consult [email or web address] for contact info under    Signed, Vickie Epley, MD  09/27/2022 9:17 AM

## 2022-09-26 NOTE — H&P (View-Only) (Signed)
Cardiology Consultation   Patient ID: [name] MRN: [id number]; DOB: May 11, 1946  Admit date: 09/25/2022 Date of Consult: 09/26/2022  PCP:  [name], [place] Providers Cardiologist:  None    Patient Profile:   [name] is a 77 y.o. male with a hx of advanced multiple sclerosis, type A aortic dissection, right heart failure with severe TR and chronic Afib who is being seen 09/26/2022 for the evaluation of bradycardia at the request of Dr. Karleen Hampshire.  History of Present Illness:   [name] is a 77 year old male with history as detailed above who is followed by [name] as an outpatient. He has a history of Type A aortic dissection in 2000 s/p emergency surgical repair with hemi-arch replacement of the ascending thoracic aorta and resuspension of the native aortic valve. 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 Because of the patient's underlying multiple sclerosis with severe generalized weakness patient was not felt to be candidate for surgical intervention. He was referred to [facility] for a second opinion and again not felt to be candidate for elective surgery. Also with history chronic diastolic heart failure and RV failure with severe TR as well as chronic Afib on apixaban and metoprolol. Deemed not to be a candidate for TV operative due to progressive multiple sclerosis. Has been managed with diuretics.   Was seen in the ER on 09/23/22 with diminished UOP and rising Cr. During thatER visit, he was given lasix 40mg  IV with improved UOP. He was offered admission at that time but declined. He was instructed to take lasix 80mg  BID for 3 days and then go back to his home dosing. After discharge, the patient was compliant with the increased dose of lasix but noted he had minimal UOP prompting  him to come back to the ER for evaluation.  Here, labs notable for Cr 3.4 from baseline 2.2-2.3. BNP 418. CXR with no acute pathology. He was incidentally noted to be profoundly bradycardic with HR 33 with underlying Afib with slow ventricular response. Blood pressure has been okay in 90-120s/40-50s. Takes metop 25mg  XL daily at home with last dose yesterday. Cardiology was asked to see due to significant bradycardia.  Currently, the patient is comfortable and sitting in bed. HR remain 20-30s. He denies any chest pain, lightheadedness, dizziness, syncope or SOB. States he has been swelling in his abdomen but no LE edema. He is warm on exam and mentating well. Given rising Cr and significant bradycardia, will transfer to [facility] for urgent temp wire.    Past Medical History:  Diagnosis Date   Anemia    Aneurysm of aortic arch ([place]) 04/04/2012   Chronic aneurysmal dilatation of aortic arch with chronic type A aortic dissection, s/p replacement of ascending thoracic aorta   Aortic dissection ([place]) 11/05/1998   S/P emergency repair of acute type A aortic dissection with resuspension of native aortic valve   Aortic dissection, thoracic (HCC)    Aortic root aneurysm (HCC)    Atrial fibrillation ([place]) 01/30/2013   Atrial fibrillation    Bilateral lower extremity edema    BPH (benign prostatic hyperplasia)    CHF (congestive heart failure) ([place]) Jan 19, 1946   2D Echo - EF 50-55%, mild-moderately dilated right ventricle, mild-moderate tricuspid valve regurgitation, moderately dilated right atrium   Depression    Dyslipidemia  Exertional shortness of breath    "for awhile now" (09/11/2013)   Hemorrhoids    History of blood transfusion    Hypertension    Mitral regurgitation 02/20/2013   Multiple sclerosis (HCC)    Neuromuscular disorder (HCC)    Pleural effusion, right, large 01/30/2013   Severe tricuspid valve regurgitation 01/30/2013   Severe tricuspid regurg by recent 2-D echo with a dilated  tricuspid annulus, moderate pulmonary hypertension and biatrial enlargement    Thrombocytopenia [name])     Past Surgical History:  Procedure Laterality Date   HEMORRHOID SURGERY  2009   Internal, external hemorrhoidectomy, general anesthesia,prone position. [Other]   Open reduction and internal fixation of right distal radius fracture using [employer] distal radius volar locking plate.  07/2005   Dr Ninfa Linden   Repair of acute type A aortic dissection with resuspension of native aortic valve  10/15/1998   Dr Roxy Manns   TEE WITHOUT CARDIOVERSION N/A 02/17/2013   Procedure: TRANSESOPHAGEAL ECHOCARDIOGRAM (TEE);  Surgeon: [name], MD;  Location: [facility] ENDOSCOPY;  Service: Cardiovascular;  Laterality: N/A;     Home Medications:  Prior to Admission medications   Medication Sig Start Date End Date Taking? Authorizing Provider  apixaban (ELIQUIS) 5 MG TABS tablet Take 1 tablet (5 mg total) by mouth 2 (two) times daily. 08/10/22   Bensimhon, Shaune Pascal, MD  baclofen (LIORESAL) 10 MG tablet Take 5 mg by mouth at bedtime.    [provider]  Cholecalciferol (VITAMIN D3) 2000 UNITS TABS Take 2,000 Units by mouth daily.    [provider]  COPAXONE 40 MG/ML SOSY INJECT ONE SYRINGE SUBCUTANEOUSLY 3 TIMES A WEEK AT LEAST 48 HOURS APART. ALLOW TO WARM TO ROOM TEMP FOR 20 MINUTES. REFRIGERATE. Patient not taking: Reported on 09/23/2022 08/24/22   Penumalli, Earlean Polka, MD  COPAXONE 40 MG/ML SOSY INJECT ONE SYRINGE SUBCUTANEOUSLY 3 TIMES A WEEK AT LEAST 48 HOURS APART. ALLOW TO WARM TO ROOM TEMP FOR 20 MINUTES. REFRIGERATE. Patient taking differently: Inject 1 Syringe into the skin See admin instructions. INJECT ONE SYRINGE SUBCUTANEOUSLY 3 TIMES A WEEK (Mon/Wed/Fri) AT LEAST 48 HOURS APART. ALLOW TO WARM TO ROOM TEMP FOR 20 MINUTES. REFRIGERATE. 09/16/22   Penumalli, Earlean Polka, MD  dalfampridine 10 MG TB12 TAKE 1 TABLET BY MOUTH 2 TIMES A DAY (APPROXIMATELY 12 HOURS APART). MAY BE TAKEN WITH OR  WITHOUT FOOD. DO NOT CUT OR CRUSH. STORE AT ROOM T Patient taking differently: 10 mg See admin instructions. TAKE 10 MG (1 TABLET) BY MOUTH 2 TIMES A DAY (APPROXIMATELY 12 HOURS APART). MAY BE TAKEN WITH OR WITHOUT FOOD. DO NOT CUT OR CRUSH. STORE AT ROOM TEMP. 09/16/22   Frann Rider, NP  dapagliflozin propanediol (FARXIGA) 10 MG TABS tablet Take 1 tablet (10 mg total) by mouth daily before breakfast. Patient not taking: Reported on 09/23/2022 06/25/22   Bensimhon, Shaune Pascal, MD  folic acid (FOLVITE) 1 MG tablet Take 1 mg by mouth daily.    [provider]  furosemide (LASIX) 40 MG tablet TAKE ONE TABLET BY MOUTH DAILY EXCEPT ON MONDAYS AND FRIDAYS TAKE TWO TABLETS DAILY Patient taking differently: Take 20-40 mg by mouth See admin instructions. Take 40 mg by mouth every other day, rotating with 20 mg. May take an additional 20 mg once a day as needed for unresolved fluid or edema. 09/21/22   Bensimhon, Shaune Pascal, MD  furosemide (LASIX) 80 MG tablet Take 1 tablet (80 mg total) by mouth 2 (two) times daily for 3  days. 09/23/22 09/26/22  Drenda Freeze, MD  losartan (COZAAR) 100 MG tablet Take 1 tablet (100 mg total) by mouth daily. 08/17/22   Bensimhon, Shaune Pascal, MD  metoprolol succinate (TOPROL-XL) 25 MG 24 hr tablet Take 25 mg by mouth daily.    [provider]  Multiple Vitamins-Minerals (MULTIVITAMIN MEN) TABS Take 1 tablet by mouth daily.    [provider]  QUEtiapine (SEROQUEL) 25 MG tablet Take 25-50 mg by mouth at bedtime.    [provider]  spironolactone (ALDACTONE) 25 MG tablet Take 25 mg by mouth in the morning.    [provider]    Inpatient Medications: Scheduled Meds:  [MAR Hold] baclofen  5 mg Oral QHS   [MAR Hold] QUEtiapine  25 mg Oral QHS   Continuous Infusions:  lactated ringers 75 mL/hr at 09/26/22 0359   PRN Meds: [MAR Hold] acetaminophen **OR** [MAR Hold] acetaminophen, [MAR Hold] melatonin  Allergies:    Allergies   Allergen Reactions   Farxiga [Dapagliflozin] Other (See Comments)    B/P dropped too Henry and extreme vertigo    Social History:   Social History   Socioeconomic History   Marital status: Married    Spouse name: Debbie   Number of children: 2   Years of education: Ph.D   Highest education level: Not on file  Occupational History   Occupation: Retired Professor    Fish farm manager: [employer]    Comment: Statistics  Tobacco Use   Smoking status: Never   Smokeless tobacco: Never  Vaping Use   Vaping Use: Never used  Substance and Sexual Activity   Alcohol use: Yes    Comment: 09/11/2013 "might have a drink once/month"   Drug use: No   Sexual activity: Not Currently  Other Topics Concern   Not on file  Social History Narrative   Patient lives at home with spouse.    Caffeine Use: eat a lot of chocolate, sodas, coffee and tea occasionally   Right Handed   Social Determinants of Health   Financial Resource Strain: Henry Risk  (07/07/2018)   Overall Financial Resource Strain (CARDIA)    Difficulty of Paying Living Expenses: Not hard at all  Food Insecurity: No Food Insecurity (07/07/2018)   Hunger Vital Sign    Worried About Running Out of Food in the Last Year: Never true    Ran Out of Food in the Last Year: Never true  Transportation Needs: No Transportation Needs (07/07/2018)   PRAPARE - Hydrologist (Medical): No    Lack of Transportation (Non-Medical): No  Physical Activity: Not on file  Stress: Not on file  Social Connections: Not on file  Intimate Partner Violence: Not on file    Family History:    Family History  Problem Relation Age of Onset   Thyroid cancer Father        smoked   Heart attack Mother    Lung cancer Brother        smoked   Emphysema Brother        smoked     ROS:  Please see the history of present illness.  All other ROS reviewed and negative.     Physical Exam/Data:   Vitals:   09/26/22 0530  09/26/22 0656 09/26/22 0700 09/26/22 0839  BP: (!) 100/44 (!) 110/45 (!) 111/51   Pulse: 62 (!) 39 (!) 33   Resp: 14 13 13    Temp:  97.6 F (36.4 C)  TempSrc:    Oral  SpO2: 100% 100% 100%   Weight:      Height:       No intake or output data in the 24 hours ending 09/26/22 1003    09/25/2022   12:06 PM 06/20/2021    2:55 PM 05/27/2021   12:28 PM  Last 3 Weights  Weight (lbs) 169 lb 180 lb 6.4 oz 169 lb  Weight (kg) 76.658 kg 81.829 kg 76.658 kg     Body mass index is 25.7 kg/m.  General:  Comfortable, NAD HEENT: normal Neck: JVD mid-neck Vascular: No carotid bruits; Distal pulses 2+ bilaterally Cardiac:  Profoundly bradycardic, 2/6 systolic murmur  Lungs:  clear to auscultation bilaterally, no wheezing, rhonchi or rales  Abd: soft, nontender, no hepatomegaly  Ext: no edema, warm Musculoskeletal:  No deformities Neuro:  CNs 2-12 intact, no focal abnormalities noted Psych:  Normal affect   EKG:  The EKG was personally reviewed and demonstrates:  Afib with slow ventricular response with HR 33, RBBB Telemetry:  Telemetry was personally reviewed and demonstrates:  Afib with HR 20-40s  Relevant CV Studies: TTE 07/2022: IMPRESSIONS     1. Left ventricular ejection fraction, by estimation, is 55 to 60%. The  left ventricle has normal function. The left ventricle has no regional  wall motion abnormalities. Left ventricular diastolic function could not  be evaluated. The average left  ventricular global longitudinal strain is -18.0 %. The global longitudinal  strain is normal.   2. Right ventricular systolic function is mildly reduced. The right  ventricular size is moderately enlarged. There is normal pulmonary artery  systolic pressure. The estimated right ventricular systolic pressure is  75.6 mmHg.   3. Left atrial size was moderately dilated.   4. Right atrial size was severely dilated.   5. The mitral valve is normal in structure. Mild mitral valve  regurgitation.  No evidence of mitral stenosis.   6. Tricuspid valve regurgitation is mild to moderate.   7. The aortic valve is tricuspid. Aortic valve regurgitation is mild.   8. Aortic dilatation noted. There is severe dilatation sinus of valsalva,  noncoronary, measuring 61 mm.   9. The inferior vena cava is dilated in size with <50% respiratory  variability, suggesting right atrial pressure of 15 mmHg.   Comparison(s): No significant change from prior study. Prior images  reviewed side by side.   Conclusion(s)/Recommendation(s): Severe dilation at the sinus of Valsalva  (noncoronary cusp) measuring 61 mm (prior 60 mm).    Laboratory Data:  High Sensitivity Troponin:  No results for input(s): "TROPONINIHS" in the last 720 hours.   Chemistry Recent Labs  Lab 09/23/22 1402 09/25/22 1223 09/26/22 0529  NA 135 139 137  K 5.6* 4.8 4.8  CL 103 105 106  CO2 23 22 20*  GLUCOSE 102* 105* 106*  BUN 82* 86* 94*  CREATININE 3.14* 3.41* 3.30*  CALCIUM 9.4 9.6 9.6  MG  --   --  2.5*  GFRNONAA 20* 18* 19*  ANIONGAP 9 12 11     Recent Labs  Lab 09/23/22 1402 09/26/22 0529  PROT 7.5 6.6  ALBUMIN 4.3 3.6  AST 54* 42*  ALT 62* 59*  ALKPHOS 267* 214*  BILITOT 0.5 0.7   Lipids No results for input(s): "CHOL", "TRIG", "HDL", "LABVLDL", "LDLCALC", "CHOLHDL" in the last 168 hours.  Hematology Recent Labs  Lab 09/23/22 1402 09/25/22 1223 09/26/22 0529  WBC 5.3 5.2 6.4  RBC 4.06* 3.77*  3.94*  HGB 11.5* 10.7* 11.2*  HCT 36.6* 34.1* 35.7*  MCV 90.1 90.5 90.6  MCH 28.3 28.4 28.4  MCHC 31.4 31.4 31.4  RDW 13.9 14.0 14.1  PLT 137* 113* 119*   Thyroid No results for input(s): "TSH", "FREET4" in the last 168 hours.  BNP Recent Labs  Lab 09/23/22 1402 09/26/22 0529  BNP 220.4* 418.7*    DDimer No results for input(s): "DDIMER" in the last 168 hours.   Radiology/Studies:  DG Chest Port 1 View  Result Date: 09/26/2022 CLINICAL DATA:  Urinary retention EXAM: PORTABLE CHEST 1 VIEW  COMPARISON:  09/23/2022 FINDINGS: Cardiac shadow is enlarged. Postsurgical changes are again seen. Lungs are well aerated bilaterally. No bony abnormality is seen. IMPRESSION: No acute abnormality noted. Electronically Signed   By: Inez Catalina M.D.   On: 09/26/2022 03:44   US Renal  Result Date: 09/25/2022 CLINICAL DATA:  Urinary retention. EXAM: RENAL / URINARY TRACT ULTRASOUND COMPLETE COMPARISON:  09/23/2022 ultrasound, 12/27/2017 CT FINDINGS: Right Kidney: Renal measurements: 9.6 x 6.0 x 6.4 centimeters = volume: 108.9 mL. Simple cyst in the mid to UPPER pole region is 5.5 centimeters. Simple cyst in the midpole region is 1.3 centimeters. No solid mass. No hydronephrosis. Renal parenchymal echogenicity is normal. Left Kidney: Renal measurements: 10.0 x 4.6 x 4.9 centimeters = volume: 123.4 mL. Multiple cysts are present, largest measuring 4.9 centimeters. No solid mass. No hydronephrosis. Normal renal echogenicity. Bladder: Appears normal for degree of bladder distention. Other: Study quality is degraded by overlying bowel gas, patient body habitus, and limited patient positioning. IMPRESSION: Bilateral renal cysts. No hydronephrosis. Electronically Signed   By: Nolon Nations M.D.   On: 09/25/2022 13:07   US Renal  Result Date: 09/23/2022 CLINICAL DATA:  Acute kidney insufficiency EXAM: RENAL / URINARY TRACT ULTRASOUND COMPLETE COMPARISON:  09/03/2021 renal ultrasound. Older CT scans 12/27/2017 and older FINDINGS: Right Kidney: Renal measurements: 9.9 x 6.9 x 5.5 = volume: 198.7 mL. Mild global atrophy and thinning of the parenchyma. There is a exophytic midportion anechoic structure measuring 4.5 x 3.6 x 4.5 cm consistent with a benign cyst. This was seen previously. No significant collecting system dilatation or perinephric fluid. Left Kidney: Renal measurements: 12.5 x 4.8 x 4.7 = volume: 146.8 mL. Mild global parenchymal atrophy. No collecting system dilatation. Simple appearing cysts also seen  in the left kidney measuring up to 4.9 x 4.9 x 4.0 cm and a second focus measuring 4.0 x 3.9 x 3.6 cm. This was seen previously. Bladder: Trabeculated thickened wall of the bladder. Bladder is mildly distended. Other: Limited visualization of the aorta and IVC. IMPRESSION: Mild renal atrophy. No collecting system dilatation. Bilateral renal cysts which appear simple. Electronically Signed   By: Jill Side M.D.   On: 09/23/2022 18:18   DG Chest 2 View  Result Date: 09/23/2022 CLINICAL DATA:  Weight gain. Congestive heart failure. Shortness of breath. End-stage renal failure. EXAM: CHEST - 2 VIEW COMPARISON:  AP chest 03/01/2020 FINDINGS: Status post median sternotomy. Cardiac silhouette is again mildly to moderately enlarged. Mediastinal contours are within normal limits. The lungs are clear. No pleural effusion or pneumothorax. Mild lateral lower lung horizontal linear subsegmental atelectasis versus scarring. No pleural effusion or pneumothorax. Mild multilevel degenerative disc changes of the thoracic spine. IMPRESSION: 1. No acute lung process. 2. Mild to moderate cardiomegaly. Electronically Signed   By: Yvonne Kendall M.D.   On: 09/23/2022 12:36     Assessment and Plan:   #Profound Bradycardia: #Afib  with slow ventricular response -Patient with HR 20-30s on admission in the setting of taking metop 25mg  XL daily -HD stable currently with SBP 90-120s; warm on exam -Likely profound bradycardia is contributing to worsening AKI -Will hold nodal agents and transfer to [facility] for urgent temp wire -Last dose of apixaban was yesterday; will change to heparin going forward in case need for PPM -Will need to reassess HR 48h after last metop dose; may ultimately need PPM placement and this was discussed with the patient today  #Chronic diastolic/Right heart failure with severe TR  - TTE 07/2022 with LVEF 88-11%, mild RV systolic dysfunction, moderate TR, RAP 15 - Symptomatically worse due to progressive  multiple sclerosis. NYHA III - BNP 400s on admission; holding diuretics and farxiga due to significant bradycardia with poor forward flow and rising Cr - Will resume home meds as able once bradycardia/Cr improves - Deemed not to be operative candidate for TR due to progressive MS   #History of Type A Aortic dissection s/p emergent surgical repair with chronic dissection and aneurysm:  - S/p hemi-arch replacement of the ascending thoracic aorta and resuspension of the native aortic valve 2000 - Now with chronic dissection and aneurysm involving sinus of Valsalva and aortic arch -Not a candidate for surgery due to progressive multiple sclerosis -On medical management and following with [facility]   #HTN  -  Hold BP agents given profound bradycardia as above   #Severe multiple sclerosis - Has had progressive MS unfortunately - Follows with Neurology - Resume home meds; need to ensure medications are not contributing to bradycardia  INFORMED CONSENT: I have reviewed the risks, indications, and alternatives to temporary pacemaker placement. Risks include but are not limited to bleeding, infection, vascular injury, stroke, myocardial infection, arrhythmia, kidney injury, radiation-related injury in the case of prolonged fluoroscopy use, emergency cardiac surgery, and death. The patient understands the risks and is willing to proceed.    Risk Assessment/Risk Scores:        New York Heart Association (NYHA) Functional Class NYHA Class III  CHA2DS2-VASc Score = 5   This indicates a 7.2% annual risk of stroke. The patient's score is based upon: CHF History: 1 HTN History: 1 Diabetes History: 0 Stroke History: 0 Vascular Disease History: 1 Age Score: 2 Gender Score: 0       For questions or updates, please contact [place] Please consult [email or web address] for contact info under    Signed, Freada Bergeron, MD  09/26/2022 10:03 AM

## 2022-09-26 NOTE — ED Notes (Signed)
[facility] called for transport ED to [facility] ED.

## 2022-09-26 NOTE — ED Notes (Signed)
Pt hr 27 at the current time. Pt is talking and appears in no distress. ED doc made aware. Hospitalist made aware and is in route

## 2022-09-26 NOTE — Progress Notes (Signed)
[place] for heparin Indication: atrial fibrillation  Allergies  Allergen Reactions   Farxiga [Dapagliflozin] Other (See Comments)    B/P dropped too low and extreme vertigo    Patient Measurements: Height: 5\' 8"  (172.7 cm) Weight: 76.7 kg (169 lb) IBW/kg (Calculated) : 68.4 Heparin Dosing Weight: 76kg  Vital Signs: Temp: 97.6 F (36.4 C) (01/13 0839) Temp Source: Oral (01/13 0839) BP: 111/51 (01/13 0700) Pulse Rate: 33 (01/13 0700)  Labs: Recent Labs    09/23/22 1402 09/25/22 1223 09/26/22 0529  HGB 11.5* 10.7* 11.2*  HCT 36.6* 34.1* 35.7*  PLT 137* 113* 119*  CREATININE 3.14* 3.41* 3.30*  CKTOTAL  --   --  37*    Estimated Creatinine Clearance: 18.7 mL/min (A) (by C-G formula based on SCr of 3.3 mg/dL (H)).   Medical History: Past Medical History:  Diagnosis Date   Anemia    Aneurysm of aortic arch ([place]) 04/04/2012   Chronic aneurysmal dilatation of aortic arch with chronic type A aortic dissection, s/p replacement of ascending thoracic aorta   Aortic dissection ([place]) 11/05/1998   S/P emergency repair of acute type A aortic dissection with resuspension of native aortic valve   Aortic dissection, thoracic (HCC)    Aortic root aneurysm (HCC)    Atrial fibrillation ([place]) 01/30/2013   Atrial fibrillation    Bilateral lower extremity edema    BPH (benign prostatic hyperplasia)    CHF (congestive heart failure) ([place]) 1946/01/24   2D Echo - EF 50-55%, mild-moderately dilated right ventricle, mild-moderate tricuspid valve regurgitation, moderately dilated right atrium   Depression    Dyslipidemia    Exertional shortness of breath    "for awhile now" (09/11/2013)   Hemorrhoids    History of blood transfusion    Hypertension    Mitral regurgitation 02/20/2013   Multiple sclerosis (HCC)    Neuromuscular disorder (HCC)    Pleural effusion, right, large 01/30/2013   Severe tricuspid valve regurgitation 01/30/2013   Severe  tricuspid regurg by recent 2-D echo with a dilated tricuspid annulus, moderate pulmonary hypertension and biatrial enlargement    Thrombocytopenia ([place])      Assessment: 24 yoM admitted with bradycardia s/p temp pacer. Pt is on apixaban PTA for hx AF, pharmacy to dose IV heparin.  Discussed with Dr. Burt Knack - will hold heparin x2h post temp pacer.  Goal of Therapy:  Heparin level 0.3-0.7 units/ml aPTT 66-102 seconds Monitor platelets by anticoagulation protocol: Yes   Plan:  Heparin 1100 units/h starting at 1300 Check aPTT 8h   Arrie Senate, PharmD, [place], [facility] Clinical Pharmacist [phone] Please check AMION for all [place] numbers 09/26/2022

## 2022-09-26 NOTE — Progress Notes (Signed)
Patient is seen by [name], Dr. Karleen Hampshire made aware.

## 2022-09-26 NOTE — ED Notes (Signed)
Paramedic notified hospitalist of pt being bradycardic. Hospitalist notified via message and paged.

## 2022-09-26 NOTE — ED Notes (Signed)
Pt states last home medication was taken at 10 am yesterday. He states he took metoprolol, folic acid, Eloquis, Dalfampridine. He also states that he took his spironolactone even though it was suggested to be discontinued by his nephrologist.

## 2022-09-26 NOTE — Interval H&P Note (Signed)
History and Physical Interval Note:  09/26/2022 10:13 AM  [name]  has presented today for surgery, with the diagnosis of STEMI.  The various methods of treatment have been discussed with the patient and family. After consideration of risks, benefits and other options for treatment, the patient has consented to  Procedure(s): Coronary/Graft Acute MI Revascularization (N/A) LEFT HEART CATH AND CORONARY ANGIOGRAPHY (N/A) as a surgical intervention.  The patient's history has been reviewed, patient examined, no change in status, stable for surgery.  I have reviewed the patient's chart and labs.  Questions were answered to the patient's satisfaction.     [name]

## 2022-09-26 NOTE — Consult Note (Addendum)
Cardiology Consultation   Patient ID: AXLE PARFAIT MRN: [id number]; DOB: [birth year]  Admit date: 09/25/2022 Date of Consult: 09/26/2022  PCP:  [name], [name] Providers Cardiologist:  None    Patient Profile:   [name] is a 77 y.o. male with a hx of advanced multiple sclerosis, type A aortic dissection, right heart failure with severe TR and chronic Afib who is being seen 09/26/2022 for the evaluation of bradycardia at the request of Timothy. Karleen Henry.  History of Present Illness:   [name] is a 77 year old male with history as detailed above who is followed by Timothy. Haroldine Henry as an outpatient. He has a history of Type A aortic dissection in 2000 s/p emergency surgical repair with hemi-arch replacement of the ascending thoracic aorta and resuspension of the native aortic valve. 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 Because of the patient's underlying multiple sclerosis with severe generalized weakness patient was not felt to be candidate for surgical intervention. He was referred to [facility] for a second opinion and again not felt to be candidate for elective surgery. Also with history chronic diastolic heart failure and RV failure with severe TR as well as chronic Afib on apixaban and metoprolol. Deemed not to be a candidate for TV operative due to progressive multiple sclerosis. Has been managed with diuretics.   Was seen in the ER on 09/23/22 with diminished UOP and rising Cr. During thatER visit, he was given lasix 40mg  IV with improved UOP. He was offered admission at that time but declined. He was instructed to take lasix 80mg  BID for 3 days and then go back to his home dosing. After discharge, the patient was compliant with the increased dose of lasix but noted he had minimal UOP prompting  him to come back to the ER for evaluation.  Here, labs notable for Cr 3.4 from baseline 2.2-2.3. BNP 418. CXR with no acute pathology. He was incidentally noted to be profoundly bradycardic with HR 33 with underlying Afib with slow ventricular response. Blood pressure has been okay in 90-120s/40-50s. Takes metop 25mg  XL daily at home with last dose yesterday. Cardiology was asked to see due to significant bradycardia.  Currently, the patient is comfortable and sitting in bed. HR remain 20-30s. He denies any chest pain, lightheadedness, dizziness, syncope or SOB. States he has been swelling in his abdomen but no LE edema. He is warm on exam and mentating well. Given rising Cr and significant bradycardia, will transfer to [facility] for urgent temp wire.    Past Medical History:  Diagnosis Date   Anemia    Aneurysm of aortic arch ([place]) 04/04/2012   Chronic aneurysmal dilatation of aortic arch with chronic type A aortic dissection, s/p replacement of ascending thoracic aorta   Aortic dissection ([place]) 11/05/1998   S/P emergency repair of acute type A aortic dissection with resuspension of native aortic valve   Aortic dissection, thoracic ([facility])    Aortic root aneurysm (HCC)    Atrial fibrillation ([place]) 01/30/2013   Atrial fibrillation    Bilateral lower extremity edema    BPH (benign prostatic hyperplasia)    CHF (congestive heart failure) ([place]) 03-07-1946   2D Echo - EF 50-55%, mild-moderately dilated right ventricle, mild-moderate tricuspid valve regurgitation, moderately dilated right atrium   Depression    Dyslipidemia  Exertional shortness of breath    "for awhile now" (09/11/2013)   Hemorrhoids    History of blood transfusion    Hypertension    Mitral regurgitation 02/20/2013   Multiple sclerosis (HCC)    Neuromuscular disorder (HCC)    Pleural effusion, right, large 01/30/2013   Severe tricuspid valve regurgitation 01/30/2013   Severe tricuspid regurg by recent 2-D echo with a dilated  tricuspid annulus, moderate pulmonary hypertension and biatrial enlargement    Thrombocytopenia [facility])     Past Surgical History:  Procedure Laterality Date   HEMORRHOID SURGERY  2009   Internal, external hemorrhoidectomy, general anesthesia,prone position. [Other]   Open reduction and internal fixation of right distal radius fracture using [employer] distal radius volar locking plate.  07/2005   [name]   Repair of acute type A aortic dissection with resuspension of native aortic valve  10/15/1998   [name]   TEE WITHOUT CARDIOVERSION N/A 02/17/2013   Procedure: TRANSESOPHAGEAL ECHOCARDIOGRAM (TEE);  Surgeon: Timothy Klein, MD;  Location: [name] ENDOSCOPY;  Service: Cardiovascular;  Laterality: N/A;     Home Medications:  Prior to Admission medications   Medication Sig Start Date End Date Taking? Authorizing Provider  apixaban (ELIQUIS) 5 MG TABS tablet Take 1 tablet (5 mg total) by mouth 2 (two) times daily. 08/10/22   Henry, Timothy Pascal, MD  baclofen (LIORESAL) 10 MG tablet Take 5 mg by mouth at bedtime.    [provider]  Cholecalciferol (VITAMIN D3) 2000 UNITS TABS Take 2,000 Units by mouth daily.    [provider]  COPAXONE 40 MG/ML SOSY INJECT ONE SYRINGE SUBCUTANEOUSLY 3 TIMES A WEEK AT LEAST 48 HOURS APART. ALLOW TO WARM TO ROOM TEMP FOR 20 MINUTES. REFRIGERATE. Patient not taking: Reported on 09/23/2022 08/24/22   [name], Timothy Polka, MD  COPAXONE 40 MG/ML SOSY INJECT ONE SYRINGE SUBCUTANEOUSLY 3 TIMES A WEEK AT LEAST 48 HOURS APART. ALLOW TO WARM TO ROOM TEMP FOR 20 MINUTES. REFRIGERATE. Patient taking differently: Inject 1 Syringe into the skin See admin instructions. INJECT ONE SYRINGE SUBCUTANEOUSLY 3 TIMES A WEEK (Mon/Wed/Fri) AT LEAST 48 HOURS APART. ALLOW TO WARM TO ROOM TEMP FOR 20 MINUTES. REFRIGERATE. 09/16/22   [name], Timothy Polka, MD  dalfampridine 10 MG TB12 TAKE 1 TABLET BY MOUTH 2 TIMES A DAY (APPROXIMATELY 12 HOURS APART). MAY BE TAKEN WITH OR  WITHOUT FOOD. DO NOT CUT OR CRUSH. STORE AT ROOM T Patient taking differently: 10 mg See admin instructions. TAKE 10 MG (1 TABLET) BY MOUTH 2 TIMES A DAY (APPROXIMATELY 12 HOURS APART). MAY BE TAKEN WITH OR WITHOUT FOOD. DO NOT CUT OR CRUSH. STORE AT ROOM TEMP. 09/16/22   Frann Rider, NP  dapagliflozin propanediol (FARXIGA) 10 MG TABS tablet Take 1 tablet (10 mg total) by mouth daily before breakfast. Patient not taking: Reported on 09/23/2022 06/25/22   Henry, Timothy Pascal, MD  folic acid (FOLVITE) 1 MG tablet Take 1 mg by mouth daily.    [provider]  furosemide (LASIX) 40 MG tablet TAKE ONE TABLET BY MOUTH DAILY EXCEPT ON MONDAYS AND FRIDAYS TAKE TWO TABLETS DAILY Patient taking differently: Take 20-40 mg by mouth See admin instructions. Take 40 mg by mouth every other day, rotating with 20 mg. May take an additional 20 mg once a day as needed for unresolved fluid or edema. 09/21/22   Henry, Timothy Pascal, MD  furosemide (LASIX) 80 MG tablet Take 1 tablet (80 mg total) by mouth 2 (two) times daily for 3  days. 09/23/22 09/26/22  Drenda Freeze, MD  losartan (COZAAR) 100 MG tablet Take 1 tablet (100 mg total) by mouth daily. 08/17/22   Henry, Timothy Pascal, MD  metoprolol succinate (TOPROL-XL) 25 MG 24 hr tablet Take 25 mg by mouth daily.    [provider]  Multiple Vitamins-Minerals (MULTIVITAMIN MEN) TABS Take 1 tablet by mouth daily.    [provider]  QUEtiapine (SEROQUEL) 25 MG tablet Take 25-50 mg by mouth at bedtime.    [provider]  spironolactone (ALDACTONE) 25 MG tablet Take 25 mg by mouth in the morning.    [provider]    Inpatient Medications: Scheduled Meds:  [MAR Hold] baclofen  5 mg Oral QHS   [MAR Hold] QUEtiapine  25 mg Oral QHS   Continuous Infusions:  lactated ringers 75 mL/hr at 09/26/22 0359   PRN Meds: [MAR Hold] acetaminophen **OR** [MAR Hold] acetaminophen, [MAR Hold] melatonin  Allergies:    Allergies   Allergen Reactions   Farxiga [Dapagliflozin] Other (See Comments)    B/P dropped too Henry and extreme vertigo    Social History:   Social History   Socioeconomic History   Marital status: Married    Spouse name: Debbie   Number of children: 2   Years of education: Ph.D   Highest education level: Not on file  Occupational History   Occupation: Retired Professor    Fish farm manager: [employer]    Comment: Statistics  Tobacco Use   Smoking status: Never   Smokeless tobacco: Never  Vaping Use   Vaping Use: Never used  Substance and Sexual Activity   Alcohol use: Yes    Comment: 09/11/2013 "might have a drink once/month"   Drug use: No   Sexual activity: Not Currently  Other Topics Concern   Not on file  Social History Narrative   Patient lives at home with spouse.    Caffeine Use: eat a lot of chocolate, sodas, coffee and tea occasionally   Right Handed   Social Determinants of Health   Financial Resource Strain: Henry Risk  (07/07/2018)   Overall Financial Resource Strain (CARDIA)    Difficulty of Paying Living Expenses: Not hard at all  Food Insecurity: No Food Insecurity (07/07/2018)   Hunger Vital Sign    Worried About Running Out of Food in the Last Year: Never true    Ran Out of Food in the Last Year: Never true  Transportation Needs: No Transportation Needs (07/07/2018)   PRAPARE - Hydrologist (Medical): No    Lack of Transportation (Non-Medical): No  Physical Activity: Not on file  Stress: Not on file  Social Connections: Not on file  Intimate Partner Violence: Not on file    Family History:    Family History  Problem Relation Age of Onset   Thyroid cancer Father        smoked   Heart attack Mother    Lung cancer Brother        smoked   Emphysema Brother        smoked     ROS:  Please see the history of present illness.  All other ROS reviewed and negative.     Physical Exam/Data:   Vitals:   09/26/22 0530  09/26/22 0656 09/26/22 0700 09/26/22 0839  BP: (!) 100/44 (!) 110/45 (!) 111/51   Pulse: 62 (!) 39 (!) 33   Resp: 14 13 13    Temp:  97.6 F (36.4 C)  TempSrc:    Oral  SpO2: 100% 100% 100%   Weight:      Height:       No intake or output data in the 24 hours ending 09/26/22 1003    09/25/2022   12:06 PM 06/20/2021    2:55 PM 05/27/2021   12:28 PM  Last 3 Weights  Weight (lbs) 169 lb 180 lb 6.4 oz 169 lb  Weight (kg) 76.658 kg 81.829 kg 76.658 kg     Body mass index is 25.7 kg/m.  General:  Comfortable, NAD HEENT: normal Neck: JVD mid-neck Vascular: No carotid bruits; Distal pulses 2+ bilaterally Cardiac:  Profoundly bradycardic, 2/6 systolic murmur  Lungs:  clear to auscultation bilaterally, no wheezing, rhonchi or rales  Abd: soft, nontender, no hepatomegaly  Ext: no edema, warm Musculoskeletal:  No deformities Neuro:  CNs 2-12 intact, no focal abnormalities noted Psych:  Normal affect   EKG:  The EKG was personally reviewed and demonstrates:  Afib with slow ventricular response with HR 33, RBBB Telemetry:  Telemetry was personally reviewed and demonstrates:  Afib with HR 20-40s  Relevant CV Studies: TTE 07/2022: IMPRESSIONS     1. Left ventricular ejection fraction, by estimation, is 55 to 60%. The  left ventricle has normal function. The left ventricle has no regional  wall motion abnormalities. Left ventricular diastolic function could not  be evaluated. The average left  ventricular global longitudinal strain is -18.0 %. The global longitudinal  strain is normal.   2. Right ventricular systolic function is mildly reduced. The right  ventricular size is moderately enlarged. There is normal pulmonary artery  systolic pressure. The estimated right ventricular systolic pressure is  96.7 mmHg.   3. Left atrial size was moderately dilated.   4. Right atrial size was severely dilated.   5. The mitral valve is normal in structure. Mild mitral valve  regurgitation.  No evidence of mitral stenosis.   6. Tricuspid valve regurgitation is mild to moderate.   7. The aortic valve is tricuspid. Aortic valve regurgitation is mild.   8. Aortic dilatation noted. There is severe dilatation sinus of valsalva,  noncoronary, measuring 61 mm.   9. The inferior vena cava is dilated in size with <50% respiratory  variability, suggesting right atrial pressure of 15 mmHg.   Comparison(s): No significant change from prior study. Prior images  reviewed side by side.   Conclusion(s)/Recommendation(s): Severe dilation at the sinus of Valsalva  (noncoronary cusp) measuring 61 mm (prior 60 mm).    Laboratory Data:  High Sensitivity Troponin:  No results for input(s): "TROPONINIHS" in the last 720 hours.   Chemistry Recent Labs  Lab 09/23/22 1402 09/25/22 1223 09/26/22 0529  NA 135 139 137  K 5.6* 4.8 4.8  CL 103 105 106  CO2 23 22 20*  GLUCOSE 102* 105* 106*  BUN 82* 86* 94*  CREATININE 3.14* 3.41* 3.30*  CALCIUM 9.4 9.6 9.6  MG  --   --  2.5*  GFRNONAA 20* 18* 19*  ANIONGAP 9 12 11     Recent Labs  Lab 09/23/22 1402 09/26/22 0529  PROT 7.5 6.6  ALBUMIN 4.3 3.6  AST 54* 42*  ALT 62* 59*  ALKPHOS 267* 214*  BILITOT 0.5 0.7   Lipids No results for input(s): "CHOL", "TRIG", "HDL", "LABVLDL", "LDLCALC", "CHOLHDL" in the last 168 hours.  Hematology Recent Labs  Lab 09/23/22 1402 09/25/22 1223 09/26/22 0529  WBC 5.3 5.2 6.4  RBC 4.06* 3.77*  3.94*  HGB 11.5* 10.7* 11.2*  HCT 36.6* 34.1* 35.7*  MCV 90.1 90.5 90.6  MCH 28.3 28.4 28.4  MCHC 31.4 31.4 31.4  RDW 13.9 14.0 14.1  PLT 137* 113* 119*   Thyroid No results for input(s): "TSH", "FREET4" in the last 168 hours.  BNP Recent Labs  Lab 09/23/22 1402 09/26/22 0529  BNP 220.4* 418.7*    DDimer No results for input(s): "DDIMER" in the last 168 hours.   Radiology/Studies:  DG Chest Port 1 View  Result Date: 09/26/2022 CLINICAL DATA:  Urinary retention EXAM: PORTABLE CHEST 1 VIEW  COMPARISON:  09/23/2022 FINDINGS: Cardiac shadow is enlarged. Postsurgical changes are again seen. Lungs are well aerated bilaterally. No bony abnormality is seen. IMPRESSION: No acute abnormality noted. Electronically Signed   By: Inez Catalina M.D.   On: 09/26/2022 03:44   US Renal  Result Date: 09/25/2022 CLINICAL DATA:  Urinary retention. EXAM: RENAL / URINARY TRACT ULTRASOUND COMPLETE COMPARISON:  09/23/2022 ultrasound, 12/27/2017 CT FINDINGS: Right Kidney: Renal measurements: 9.6 x 6.0 x 6.4 centimeters = volume: 108.9 mL. Simple cyst in the mid to UPPER pole region is 5.5 centimeters. Simple cyst in the midpole region is 1.3 centimeters. No solid mass. No hydronephrosis. Renal parenchymal echogenicity is normal. Left Kidney: Renal measurements: 10.0 x 4.6 x 4.9 centimeters = volume: 123.4 mL. Multiple cysts are present, largest measuring 4.9 centimeters. No solid mass. No hydronephrosis. Normal renal echogenicity. Bladder: Appears normal for degree of bladder distention. Other: Study quality is degraded by overlying bowel gas, patient body habitus, and limited patient positioning. IMPRESSION: Bilateral renal cysts. No hydronephrosis. Electronically Signed   By: Nolon Nations M.D.   On: 09/25/2022 13:07   US Renal  Result Date: 09/23/2022 CLINICAL DATA:  Acute kidney insufficiency EXAM: RENAL / URINARY TRACT ULTRASOUND COMPLETE COMPARISON:  09/03/2021 renal ultrasound. Older CT scans 12/27/2017 and older FINDINGS: Right Kidney: Renal measurements: 9.9 x 6.9 x 5.5 = volume: 198.7 mL. Mild global atrophy and thinning of the parenchyma. There is a exophytic midportion anechoic structure measuring 4.5 x 3.6 x 4.5 cm consistent with a benign cyst. This was seen previously. No significant collecting system dilatation or perinephric fluid. Left Kidney: Renal measurements: 12.5 x 4.8 x 4.7 = volume: 146.8 mL. Mild global parenchymal atrophy. No collecting system dilatation. Simple appearing cysts also seen  in the left kidney measuring up to 4.9 x 4.9 x 4.0 cm and a second focus measuring 4.0 x 3.9 x 3.6 cm. This was seen previously. Bladder: Trabeculated thickened wall of the bladder. Bladder is mildly distended. Other: Limited visualization of the aorta and IVC. IMPRESSION: Mild renal atrophy. No collecting system dilatation. Bilateral renal cysts which appear simple. Electronically Signed   By: Jill Side M.D.   On: 09/23/2022 18:18   DG Chest 2 View  Result Date: 09/23/2022 CLINICAL DATA:  Weight gain. Congestive heart failure. Shortness of breath. End-stage renal failure. EXAM: CHEST - 2 VIEW COMPARISON:  AP chest 03/01/2020 FINDINGS: Status post median sternotomy. Cardiac silhouette is again mildly to moderately enlarged. Mediastinal contours are within normal limits. The lungs are clear. No pleural effusion or pneumothorax. Mild lateral lower lung horizontal linear subsegmental atelectasis versus scarring. No pleural effusion or pneumothorax. Mild multilevel degenerative disc changes of the thoracic spine. IMPRESSION: 1. No acute lung process. 2. Mild to moderate cardiomegaly. Electronically Signed   By: Yvonne Kendall M.D.   On: 09/23/2022 12:36     Assessment and Plan:   #Profound Bradycardia: #Afib  with slow ventricular response -Patient with HR 20-30s on admission in the setting of taking metop 25mg  XL daily -HD stable currently with SBP 90-120s; warm on exam -Likely profound bradycardia is contributing to worsening AKI -Will hold nodal agents and transfer to [facility] for urgent temp wire -Last dose of apixaban was yesterday; will change to heparin going forward in case need for PPM -Will need to reassess HR 48h after last metop dose; may ultimately need PPM placement and this was discussed with the patient today  #Chronic diastolic/Right heart failure with severe TR  - TTE 07/2022 with LVEF 21-19%, mild RV systolic dysfunction, moderate TR, RAP 15 - Symptomatically worse due to progressive  multiple sclerosis. NYHA III - BNP 400s on admission; holding diuretics and farxiga due to significant bradycardia with poor forward flow and rising Cr - Will resume home meds as able once bradycardia/Cr improves - Deemed not to be operative candidate for TR due to progressive MS   #History of Type A Aortic dissection s/p emergent surgical repair with chronic dissection and aneurysm:  - S/p hemi-arch replacement of the ascending thoracic aorta and resuspension of the native aortic valve 2000 - Now with chronic dissection and aneurysm involving sinus of Valsalva and aortic arch -Not a candidate for surgery due to progressive multiple sclerosis -On medical management and following with [facility]   #HTN  -  Hold BP agents given profound bradycardia as above   #Severe multiple sclerosis - Has had progressive MS unfortunately - Follows with Neurology - Resume home meds; need to ensure medications are not contributing to bradycardia  INFORMED CONSENT: I have reviewed the risks, indications, and alternatives to temporary pacemaker placement. Risks include but are not limited to bleeding, infection, vascular injury, stroke, myocardial infection, arrhythmia, kidney injury, radiation-related injury in the case of prolonged fluoroscopy use, emergency cardiac surgery, and death. The patient understands the risks and is willing to proceed.    Risk Assessment/Risk Scores:        New York Heart Association (NYHA) Functional Class NYHA Class III  CHA2DS2-VASc Score = 5   This indicates a 7.2% annual risk of stroke. The patient's score is based upon: CHF History: 1 HTN History: 1 Diabetes History: 0 Stroke History: 0 Vascular Disease History: 1 Age Score: 2 Gender Score: 0       For questions or updates, please contact [place] Please consult [email or web address] for contact info under    Signed, Freada Bergeron, MD  09/26/2022 10:03 AM

## 2022-09-26 NOTE — Progress Notes (Signed)
[facility]                                                                               [name], is a 77 y.o. male, DOB - [birth year], XBD:[id number] Admit date - 09/25/2022    Outpatient Primary MD for the patient is [name], MD  LOS - 1  days    Brief summary    [name] is a 77 y.o. male with medical history significant for CKD 3B, chronic diastolic heart failure with chronic right-sided systolic heart failure, MS, paroxysmal atrial fibrillation chronically anticoagulated on Eliquis, anemia of chronic disease, who is admitted to [facility] on 09/25/2022 with acute kidney injury superimposed on CKD 3B after presenting from home to [facility] ED complaining of diminished urine output.   He was admitted for further evaluation.   Assessment & Plan    Assessment and Plan:   Severe Bradycardia in the setting of atrial fibrillation with slow ventricular response:  - Notified that patient's HR has been 20 to 30's/min. He took his last metoprolol and eliquis yesterday.  - holding metoprolol. Cardiology consulted and he was transferred to cath lab at [facility] for temporary pacing.  - TSH wnl.  - on IV heparin for anti coagulation.     Acute on stage 3 b CKD:  - unclear etiology. Bradycardia driven AKI? Baseline creatinine between 2 to 2.3. admitted with a creatinine of 3.41. Hold all diuretics and nephrotoxic medications.  Nephrology consulted for further recommendations.  US renal does not show hydronephrosis.   Chronic diastolic heart failure from severe TR:  Last echo from last month showing preserved LVEF.  Holding diuretics due to AKI.     Hypertension;  BP parameters are soft.  1 lit of NS given with improvements in parameters.    Severe Multiple Sclerosis:  - progressive MS.  Restart home meds.     Mild anemia and thrombocytopenia:  -monitor counts.     Estimated body mass index is 25.7 kg/m as calculated from the  following:   Height as of this encounter: 5\' 8"  (1.727 m).   Weight as of this encounter: 76.7 kg.  Code Status: FULL CODE.  DVT Prophylaxis:  SCDs Start: 09/25/22 2011   Level of Care: Level of care: Progressive Family Communication: none at bedside.   Disposition Plan:     Remains inpatient appropriate:  bradycardia. , AKI.   Procedures:  US renal   Consultants:   Nephrology Cardiology.   Antimicrobials:   Anti-infectives (From admission, onward)    None        Medications  Scheduled Meds:  baclofen  5 mg Oral QHS   Chlorhexidine Gluconate Cloth  6 each Topical Daily   dalfampridine  10 mg Oral BID   QUEtiapine  25 mg Oral QHS   Continuous Infusions:  heparin     lactated ringers 75 mL/hr at 09/26/22 0359   PRN Meds:.acetaminophen **OR** acetaminophen, melatonin    Subjective:   [name] was seen and examined today.  No chest pain or sob.   Objective:   Vitals:   09/26/22 1130 09/26/22 1140 09/26/22 1145 09/26/22 1200  BP:  Marland Kitchen)  134/57 (!) 141/62 125/63  Pulse: (!) 0 67 72 68  Resp:  12 18 (!) 26  Temp:      TempSrc:      SpO2:   99% 96%  Weight:      Height:       No intake or output data in the 24 hours ending 09/26/22 1224 Filed Weights   09/25/22 1206  Weight: 76.7 kg     Exam General exam: Appears calm and comfortable  Respiratory system: Clear to auscultation. Respiratory effort normal. Cardiovascular system: S1 & S2 heard, bradycardic.  No JVD,  Gastrointestinal system: Abdomen is nondistended, soft and nontender.  Central nervous system: Alert and oriented. No focal neurological deficits. Extremities: Symmetric 5 x 5 power. Skin: No rashes,  Psychiatry: J Mood & affect appropriate.    Data Reviewed:  I have personally reviewed following labs and imaging studies   CBC Lab Results  Component Value Date   WBC 6.4 09/26/2022   RBC 3.94 (L) 09/26/2022   HGB 11.2 (L) 09/26/2022   HCT 35.7 (L) 09/26/2022   MCV 90.6  09/26/2022   MCH 28.4 09/26/2022   PLT 119 (L) 09/26/2022   MCHC 31.4 09/26/2022   RDW 14.1 09/26/2022   LYMPHSABS 0.6 (L) 09/26/2022   MONOABS 0.7 09/26/2022   EOSABS 0.1 09/26/2022   BASOSABS 0.0 16/06/9603     Last metabolic panel Lab Results  Component Value Date   NA 137 09/26/2022   K 4.8 09/26/2022   CL 106 09/26/2022   CO2 20 (L) 09/26/2022   BUN 94 (H) 09/26/2022   CREATININE 3.30 (H) 09/26/2022   GLUCOSE 106 (H) 09/26/2022   GFRNONAA 19 (L) 09/26/2022   GFRAA 30 (L) 03/01/2020   CALCIUM 9.6 09/26/2022   PHOS 5.0 (H) 09/26/2022   PROT 6.6 09/26/2022   ALBUMIN 3.6 09/26/2022   LABGLOB 2.6 08/22/2018   AGRATIO 1.7 08/22/2018   BILITOT 0.7 09/26/2022   ALKPHOS 214 (H) 09/26/2022   AST 42 (H) 09/26/2022   ALT 59 (H) 09/26/2022   ANIONGAP 11 09/26/2022    CBG (last 3)  No results for input(s): "GLUCAP" in the last 72 hours.    Coagulation Profile: No results for input(s): "INR", "PROTIME" in the last 168 hours.   Radiology Studies: CARDIAC CATHETERIZATION  Result Date: 09/26/2022 Successful placement of transvenous single lead pacemaker in RV apex via RIJ access   DG Chest Port 1 View  Result Date: 09/26/2022 CLINICAL DATA:  Urinary retention EXAM: PORTABLE CHEST 1 VIEW COMPARISON:  09/23/2022 FINDINGS: Cardiac shadow is enlarged. Postsurgical changes are again seen. Lungs are well aerated bilaterally. No bony abnormality is seen. IMPRESSION: No acute abnormality noted. Electronically Signed   By: Inez Catalina M.D.   On: 09/26/2022 03:44   US Renal  Result Date: 09/25/2022 CLINICAL DATA:  Urinary retention. EXAM: RENAL / URINARY TRACT ULTRASOUND COMPLETE COMPARISON:  09/23/2022 ultrasound, 12/27/2017 CT FINDINGS: Right Kidney: Renal measurements: 9.6 x 6.0 x 6.4 centimeters = volume: 108.9 mL. Simple cyst in the mid to UPPER pole region is 5.5 centimeters. Simple cyst in the midpole region is 1.3 centimeters. No solid mass. No hydronephrosis. Renal  parenchymal echogenicity is normal. Left Kidney: Renal measurements: 10.0 x 4.6 x 4.9 centimeters = volume: 123.4 mL. Multiple cysts are present, largest measuring 4.9 centimeters. No solid mass. No hydronephrosis. Normal renal echogenicity. Bladder: Appears normal for degree of bladder distention. Other: Study quality is degraded by overlying bowel gas, patient body habitus, and limited  patient positioning. IMPRESSION: Bilateral renal cysts. No hydronephrosis. Electronically Signed   By: Nolon Nations M.D.   On: 09/25/2022 13:07       Hosie Poisson M.D. [facility] 09/26/2022, 12:24 PM  Available via Epic secure chat 7am-7pm After 7 pm, please refer to night coverage provider listed on amion.

## 2022-09-27 DIAGNOSIS — Z950 Presence of cardiac pacemaker: Secondary | ICD-10-CM | POA: Diagnosis not present

## 2022-09-27 DIAGNOSIS — N1832 Chronic kidney disease, stage 3b: Secondary | ICD-10-CM | POA: Diagnosis not present

## 2022-09-27 DIAGNOSIS — N179 Acute kidney failure, unspecified: Secondary | ICD-10-CM | POA: Diagnosis not present

## 2022-09-27 DIAGNOSIS — I4891 Unspecified atrial fibrillation: Secondary | ICD-10-CM | POA: Diagnosis not present

## 2022-09-27 DIAGNOSIS — R001 Bradycardia, unspecified: Secondary | ICD-10-CM

## 2022-09-27 LAB — BASIC METABOLIC PANEL
Anion gap: 12 (ref 5–15)
BUN: 76 mg/dL — ABNORMAL HIGH (ref 8–23)
CO2: 18 mmol/L — ABNORMAL LOW (ref 22–32)
Calcium: 9.6 mg/dL (ref 8.9–10.3)
Chloride: 110 mmol/L (ref 98–111)
Creatinine, Ser: 2.59 mg/dL — ABNORMAL HIGH (ref 0.61–1.24)
GFR, Estimated: 25 mL/min — ABNORMAL LOW (ref >60)
Glucose, Bld: 101 mg/dL — ABNORMAL HIGH (ref 70–99)
Potassium: 4.7 mmol/L (ref 3.5–5.1)
Sodium: 140 mmol/L (ref 135–145)

## 2022-09-27 LAB — APTT: aPTT: 38 seconds — ABNORMAL HIGH (ref 24–36)

## 2022-09-27 LAB — CBC
HCT: 35 % — ABNORMAL LOW (ref 39.0–52.0)
Hemoglobin: 11.3 g/dL — ABNORMAL LOW (ref 13.0–17.0)
MCH: 28.5 pg (ref 26.0–34.0)
MCHC: 32.3 g/dL (ref 30.0–36.0)
MCV: 88.2 fL (ref 80.0–100.0)
Platelets: 116 10*3/uL — ABNORMAL LOW (ref 150–400)
RBC: 3.97 MIL/uL — ABNORMAL LOW (ref 4.22–5.81)
RDW: 13.8 % (ref 11.5–15.5)
WBC: 8.8 10*3/uL (ref 4.0–10.5)
nRBC: 0 % (ref 0.0–0.2)

## 2022-09-27 LAB — HEPARIN LEVEL (UNFRACTIONATED): Heparin Unfractionated: >1.1 IU/mL — ABNORMAL HIGH (ref 0.30–0.70)

## 2022-09-27 LAB — MAGNESIUM: Magnesium: 2.2 mg/dL (ref 1.7–2.4)

## 2022-09-27 LAB — BRAIN NATRIURETIC PEPTIDE: B Natriuretic Peptide: 537.9 pg/mL — ABNORMAL HIGH (ref 0.0–100.0)

## 2022-09-27 MED ORDER — BACLOFEN 5 MG HALF TABLET
5.0000 mg | ORAL_TABLET | Freq: Once | ORAL | Status: AC
  Administered 2022-09-27: 5 mg via ORAL
  Filled 2022-09-27: qty 1

## 2022-09-27 MED ORDER — FOLIC ACID 1 MG PO TABS
1.0000 mg | ORAL_TABLET | Freq: Every day | ORAL | Status: DC
  Administered 2022-09-27 – 2022-10-02 (×6): 1 mg via ORAL
  Filled 2022-09-27 (×6): qty 1

## 2022-09-27 MED ORDER — "THROMBI-PAD 3""X3"" EX PADS": 1.0000 | MEDICATED_PAD | Freq: Once | CUTANEOUS | Status: DC

## 2022-09-27 MED ORDER — GLATIRAMER ACETATE 40 MG/ML ~~LOC~~ SOSY
40.0000 mg | PREFILLED_SYRINGE | Freq: Once | SUBCUTANEOUS | Status: AC
  Administered 2022-09-27: 40 mg via SUBCUTANEOUS
  Filled 2022-09-27: qty 1

## 2022-09-27 MED ORDER — BACLOFEN 10 MG PO TABS
10.0000 mg | ORAL_TABLET | Freq: Three times a day (TID) | ORAL | Status: DC | PRN
  Administered 2022-09-27 – 2022-09-28 (×4): 10 mg via ORAL
  Administered 2022-09-28: 5 mg via ORAL
  Administered 2022-09-29 (×2): 10 mg via ORAL
  Filled 2022-09-27 (×9): qty 1

## 2022-09-27 MED ORDER — GLATIRAMER ACETATE 40 MG/ML ~~LOC~~ SOSY: 40.0000 mg | PREFILLED_SYRINGE | SUBCUTANEOUS | Status: DC

## 2022-09-27 MED ORDER — "THROMBI-PAD 3""X3"" EX PADS"
1.0000 | MEDICATED_PAD | CUTANEOUS | Status: AC
  Administered 2022-09-27: 1 via TOPICAL
  Filled 2022-09-27: qty 1

## 2022-09-27 MED ORDER — GLATIRAMER ACETATE 40 MG/ML ~~LOC~~ SOSY
40.0000 mg | PREFILLED_SYRINGE | SUBCUTANEOUS | Status: DC
  Administered 2022-09-30 – 2022-10-02 (×2): 40 mg via SUBCUTANEOUS
  Filled 2022-09-27 (×2): qty 1

## 2022-09-27 MED ORDER — VITAMIN D 25 MCG (1000 UNIT) PO TABS
2000.0000 [IU] | ORAL_TABLET | Freq: Every day | ORAL | Status: DC
  Administered 2022-09-27 – 2022-10-02 (×6): 2000 [IU] via ORAL
  Filled 2022-09-27 (×6): qty 2

## 2022-09-27 NOTE — Progress Notes (Signed)
OT Cancellation Note  Patient Details Name: ABBIE JABLON MRN: [id number] DOB: 29-Mar-1946   Cancelled Treatment:    Reason Eval/Treat Not Completed: Patient not medically ready (Per cardiology, who asked to hold currently due to pt being transvenously paced. Will sign off for now; please re-consult once temp pacer is removed and pt appropriate for OT evaluation.)  Elliot Cousin 09/27/2022, 1:13 PM

## 2022-09-27 NOTE — Progress Notes (Addendum)
PT Cancellation Note  Patient Details Name: ETHIN DRUMMOND MRN: [id number] DOB: 22-Nov-1945   Cancelled Treatment:    Reason Eval/Treat Not Completed: Patient not medically ready Discussed with cardiology, who asked to hold currently due to pt being transvenously paced. Will sign off for now; please re-consult once temp pacer is removed and pt appropriate for PT evaluation.  Wyona Almas, PT, DPT Acute Rehabilitation Services Office [place] 09/27/2022, 12:40 PM

## 2022-09-27 NOTE — Progress Notes (Signed)
[facility] Progress Note  Subjective: BPs wnl, HR 70, [place] 100%. UOP 500 cc yest and 850 cc today. Creat is better at 2.5 today.   Vitals:   09/27/22 0300 09/27/22 0400 09/27/22 0500 09/27/22 0600  BP: 137/62 130/71 124/70 134/67  Pulse: 69 70 70 70  Resp: 15 18 16 17   Temp:      TempSrc:      SpO2: 99% 100% 100% 100%  Weight:      Height:        Exam: Gen alert, no distress, [place] O2   R neck CVC introducer No jvd or bruits Chest clear bilat to bases RRR no RG Abd soft ntnd no mass or ascites +bs Ext trace LE edema Neuro is alert, Ox 3 , nf        Home meds include - eliquis, baclofen, copaxone, dalfampridine, dapagliflozin, lasix 40-80mg  , losartan 100 qd, metoprolol xl 25 qd, seroquel, aldactone 25 qd, prns/ vits/ supps       Date                         Creat               eGFR     2009- 2013              1.05- 1.41     2014                        4.26 >> 3.46    AKI episode     2015                        2.31 >> 1.47    AKI episode     2016                        1.14- 1.40     2018                        1.23- 1.53     2019                        1.49- 1.52     2021                        2.08- 2.43     Oct -nov 2022         1.90- 2.03        34-37 ml/min     07/16/22                  2.18                 31 ml/min                 09/23/22                3.14                 20 ml/min                                                    09/25/22  3.41       09/26/21                  3.30                 19 ml/min; temp pacer placed        1/14   2.59         UA 1/10 - negative       CXR - no active disease       Renal US - 9.6/ 10 cm kidneys w/o hydro, mild [place] Pace        Assessment/ Plan: AKI on CKD 3b - b/l creatinine 2.2 from Nov 2023, eGFR 31 ml/min. F/b Dr Carolin Sicks at [facility]. Creat 3.1 here on admission in setting of bradycardia and decreased UOP. Pt seen by cardiology and underwent temporary pacer placement 1/13.  Pt is euvolemic on  exam. Renal US neg for obstruction and UA is negative. HR's are normal now after pacemaker placement. Also sp 1L bolus NS and some IVF"s initially. Home ARB is being hold. UOP is now good and creat today down to 2.59. Would hold ARB/ acei for 1-2 weeks. No other suggestions, will sign off. Pt will f/u w/ Dr Carolin Sicks at [facility]. Please call w/ any questions.  Severe bradycardia - sp temporary pacer placement 1/13, per cardiology HTN - BP's stable, no BP lowering meds or diuretics currently. Hold ARB 1-2 weeks Chronic diast CHF / severe TR / RHF - CXR clear, euvolemic on exam Multiple sclerosis       Rob Sparsh Callens 09/27/2022, 6:57 AM   Recent Labs  Lab 09/23/22 1402 09/25/22 1223 09/26/22 0529  HGB 11.5* 10.7* 11.2*  ALBUMIN 4.3  --  3.6  CALCIUM 9.4 9.6 9.6  PHOS  --   --  5.0*  CREATININE 3.14* 3.41* 3.30*  K 5.6* 4.8 4.8   No results for input(s): "IRON", "TIBC", "FERRITIN" in the last 168 hours. Inpatient medications:  baclofen  5 mg Oral QHS   Chlorhexidine Gluconate Cloth  6 each Topical Daily   QUEtiapine  25 mg Oral QHS    heparin Stopped (09/27/22 0057)   acetaminophen **OR** acetaminophen, melatonin

## 2022-09-27 NOTE — Progress Notes (Signed)
Rounding Note    Patient Name: [name] Date of Encounter: 09/27/2022  [place] Cardiologist: None   Subjective   He had some oozing from RIJ site overnight and IV Heparin was stopped. Main complaint this morning is muscle spasms from his multiple sclerosis. No chest pain, shortness of breath, lightheadedness, dizziness. Currently dependent on transvenous pacing.  Inpatient Medications    Scheduled Meds:  Chlorhexidine Gluconate Cloth  6 each Topical Daily   cholecalciferol  2,000 Units Oral Daily   folic acid  1 mg Oral Daily   [START ON 09/28/2022] Glatiramer Acetate  40 mg Subcutaneous Once per day on Mon Wed Fri   QUEtiapine  25 mg Oral QHS   Continuous Infusions:  PRN Meds: acetaminophen **OR** acetaminophen, baclofen, melatonin   Vital Signs    Vitals:   09/27/22 0600 09/27/22 0700 09/27/22 0752 09/27/22 0800  BP: 134/67 (!) 120/56  135/82  Pulse: 70 70  70  Resp: 17 15  18   Temp:   98.6 F (37 C)   TempSrc:   Oral   SpO2: 100% 100%  99%  Weight:      Height:        Intake/Output Summary (Last 24 hours) at 09/27/2022 0923 Last data filed at 09/27/2022 0600 Gross per 24 hour  Intake 806.09 ml  Output 1350 ml  Net -543.91 ml      09/25/2022   12:06 PM 06/20/2021    2:55 PM 05/27/2021   12:28 PM  Last 3 Weights  Weight (lbs) 169 lb 180 lb 6.4 oz 169 lb  Weight (kg) 76.658 kg 81.829 kg 76.658 kg      Telemetry    Ventricular paced with rates in the 60s to 70s. - Personally Reviewed  ECG    No new ECG tracing today. - Personally Reviewed  Physical Exam   GEN: No acute distress.  Neck: No JVD. Transvenous pacing exiting RIJ with minimal bleeding near venotomy. Cardiac: RRR. Soft II/VI systolic murmur.  Respiratory: No increased work of breathing. Clear to auscultation bilaterally. No wheezes, rhonchi, or rales. GI: Soft and non-tender. MS: No lower extremity edema. No deformity. Skin: Warm and dry. Neuro:  No focal  deficits. Psych: Normal affect. Responds appropriately.  Labs    High Sensitivity Troponin:  No results for input(s): "TROPONINIHS" in the last 720 hours.   Chemistry Recent Labs  Lab 09/23/22 1402 09/25/22 1223 09/26/22 0529 09/27/22 0620  NA 135 139 137 140  K 5.6* 4.8 4.8 4.7  CL 103 105 106 110  CO2 23 22 20* 18*  GLUCOSE 102* 105* 106* 101*  BUN 82* 86* 94* 76*  CREATININE 3.14* 3.41* 3.30* 2.59*  CALCIUM 9.4 9.6 9.6 9.6  MG  --   --  2.5* 2.2  PROT 7.5  --  6.6  --   ALBUMIN 4.3  --  3.6  --   AST 54*  --  42*  --   ALT 62*  --  59*  --   ALKPHOS 267*  --  214*  --   BILITOT 0.5  --  0.7  --   GFRNONAA 20* 18* 19* 25*  ANIONGAP 9 12 11 12     Lipids No results for input(s): "CHOL", "TRIG", "HDL", "LABVLDL", "LDLCALC", "CHOLHDL" in the last 168 hours.  Hematology Recent Labs  Lab 09/25/22 1223 09/26/22 0529 09/27/22 0620  WBC 5.2 6.4 8.8  RBC 3.77* 3.94* 3.97*  HGB 10.7* 11.2* 11.3*  HCT 34.1*  35.7* 35.0*  MCV 90.5 90.6 88.2  MCH 28.4 28.4 28.5  MCHC 31.4 31.4 32.3  RDW 14.0 14.1 13.8  PLT 113* 119* 116*   Thyroid  Recent Labs  Lab 09/26/22 1136  TSH 3.238    BNP Recent Labs  Lab 09/23/22 1402 09/26/22 0529 09/27/22 0620  BNP 220.4* 418.7* 537.9*    DDimer No results for input(s): "DDIMER" in the last 168 hours.   Radiology    CARDIAC CATHETERIZATION  Result Date: 09/26/2022 Successful placement of transvenous single lead pacemaker in RV apex via RIJ access   DG Chest Port 1 View  Result Date: 09/26/2022 CLINICAL DATA:  Urinary retention EXAM: PORTABLE CHEST 1 VIEW COMPARISON:  09/23/2022 FINDINGS: Cardiac shadow is enlarged. Postsurgical changes are again seen. Lungs are well aerated bilaterally. No bony abnormality is seen. IMPRESSION: No acute abnormality noted. Electronically Signed   By: Inez Catalina M.D.   On: 09/26/2022 03:44   US Renal  Result Date: 09/25/2022 CLINICAL DATA:  Urinary retention. EXAM: RENAL / URINARY TRACT  ULTRASOUND COMPLETE COMPARISON:  09/23/2022 ultrasound, 12/27/2017 CT FINDINGS: Right Kidney: Renal measurements: 9.6 x 6.0 x 6.4 centimeters = volume: 108.9 mL. Simple cyst in the mid to UPPER pole region is 5.5 centimeters. Simple cyst in the midpole region is 1.3 centimeters. No solid mass. No hydronephrosis. Renal parenchymal echogenicity is normal. Left Kidney: Renal measurements: 10.0 x 4.6 x 4.9 centimeters = volume: 123.4 mL. Multiple cysts are present, largest measuring 4.9 centimeters. No solid mass. No hydronephrosis. Normal renal echogenicity. Bladder: Appears normal for degree of bladder distention. Other: Study quality is degraded by overlying bowel gas, patient body habitus, and limited patient positioning. IMPRESSION: Bilateral renal cysts. No hydronephrosis. Electronically Signed   By: Nolon Nations M.D.   On: 09/25/2022 13:07    Cardiac Studies   Temporary Pacemaker 09/26/2022: Successful placement of transvenous single lead pacemaker in RV apex via RIJ access.  Patient Profile     77 y.o. male with a history of chronic atrial fibrillation on Eliquis, chronic diastolic CHF with right heart failure and severe TR, type A aortic dissection s/p emergent repair with hemi-arch replacement of the ascending thoracic aorta and resuspension of the native aortic valve in 2000 with chronic dissection of remaining aorta, hypertension, dyslipidemia, and advanced multiple sclerosis who presented to the [facility] ED on 09/26/2022 for further evaluation of decreased urinary output and was noted to be profoundly bradycardic and was transferred to [facility] for temporary pacemaker   Assessment & Plan    Profound Bradycardia Atrial Fibrillation with Slow Ventricular Response Patient found to have heart rates in the 20-30s on presentation. Home Metoprolol stopped and temporary pacemaker placed. Electrolytes OK.  - Currently dependent on transvenous pacing. Heart rates in the 60s to 70s. - He had  some bleeding from RIJ overnight and IV Heparin has been held. Dressing changed while I was in the room. Still slowly oozing but much improved from overnight per RN. Continue to hold until bleeding stops. Hemoglobin stable at 11.3. - Continue to hold AV nodal agents. - EP has seen. Will likely need PPM later this week after kidney function improves to minimize bleeding risk associated with his uremia.  Chronic HFpEF Right Heart Failure with Severe TR Recent Echo in 07/2022 showed LVEF of 18-84%, mild RV systolic function, moderate TR, and RAP of 15. Symptomatically worse due to progressive multiple sclerosis. NYHA III. BNP 418 on admission. Chest x-ray showed no acute finding. - Does not  appear significantly volume overloaded on exam.  - Home diuretics were held on admission due to significant bradycardia with poor forward flow and worsening renal function. Creatinine much better today at 2.59 but still above baseline. - Would continue to hold home Lasix and Spironolactone for now. - Continue to monitor daily weights, strict I/Os, and renal function.  History of Type A Aortic Dissection s/p Repair S/p emergent repair with hemi-arch replacement of the ascending thoracic aorta and resuspension of the native aortic valve in 2000. 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 Not felt to be a candidate for surgery due to progressive multiple sclerosis. - Continue strict BP control. - Followed by CT Surgery as an outpatient.  Hypertension BP well controlled.  - Home medications on hold due to renal function and profound bradycardia.  Acute on CKD Stage III Creatinine 3.41 on 09/25/2022. Baseline around 2.1 to 2.3. Likely secondary to profound bradycardia. - Creatinine improved to 2.59 today after transvenous pacing and IV fluids. - Continue to hold home Lasix, Losartan, and Spironolactone for now. - Nephrology following.  Multiple Sclerosis Patient's main  complaint this morning is muscle spasms from his MS. - Management per primary team.    For questions or updates, please contact [place] Please consult [email or web address] for contact info under        Signed, Darreld Mclean, PA-C  09/27/2022, 9:23 AM

## 2022-09-27 NOTE — Progress Notes (Signed)
Rounding Note    Patient Name: [name] Date of Encounter: 09/28/2022  [place] Cardiologist: None   Subjective   Feeling okay this morning. No chest pain or SOB.  Plan for PPM today  Inpatient Medications    Scheduled Meds:  Chlorhexidine Gluconate Cloth  6 each Topical Daily   cholecalciferol  2,000 Units Oral Daily   folic acid  1 mg Oral Daily   [START ON 09/30/2022] Glatiramer Acetate  40 mg Subcutaneous Once per day on Mon Wed Fri   QUEtiapine  25 mg Oral QHS   sodium zirconium cyclosilicate  10 g Oral Once   Continuous Infusions:  PRN Meds: acetaminophen **OR** acetaminophen, baclofen, melatonin   Vital Signs    Vitals:   09/28/22 0400 09/28/22 0500 09/28/22 0600 09/28/22 0700  BP: (!) 132/44 (!) 126/110 117/65 (!) 142/57  Pulse: 69 70 69 60  Resp: 17 (!) 27 14 15   Temp:      TempSrc:      SpO2: 97% 97% 98% 97%  Weight:      Height:        Intake/Output Summary (Last 24 hours) at 09/28/2022 0713 Last data filed at 09/28/2022 0400 Gross per 24 hour  Intake 840 ml  Output 1050 ml  Net -210 ml      09/25/2022   12:06 PM 06/20/2021    2:55 PM 05/27/2021   12:28 PM  Last 3 Weights  Weight (lbs) 169 lb 180 lb 6.4 oz 169 lb  Weight (kg) 76.658 kg 81.829 kg 76.658 kg      Telemetry    V-paced - Personally Reviewed  ECG    09/26/22: V-paced with PVCs - Personally Reviewed  Physical Exam   GEN: No acute distress.  Sitting up in bed Neck: Right TVP in place Cardiac: RRR, no murmurs, rubs, or gallops.  Respiratory: Clear to auscultation bilaterally. GI: Soft, nontender, non-distended  MS: No edema; No deformity. Neuro:  Nonfocal  Psych: Normal affect   Labs    High Sensitivity Troponin:  No results for input(s): "TROPONINIHS" in the last 720 hours.   Chemistry Recent Labs  Lab 09/23/22 1402 09/25/22 1223 09/26/22 0529 09/27/22 0620 09/28/22 0043  NA 135   < > 137 140 140  K 5.6*   < > 4.8 4.7 5.0  CL 103   < >  106 110 111  CO2 23   < > 20* 18* 21*  GLUCOSE 102*   < > 106* 101* 116*  BUN 82*   < > 94* 76* 66*  CREATININE 3.14*   < > 3.30* 2.59* 2.43*  CALCIUM 9.4   < > 9.6 9.6 9.2  MG  --   --  2.5* 2.2 2.2  PROT 7.5  --  6.6  --   --   ALBUMIN 4.3  --  3.6  --   --   AST 54*  --  42*  --   --   ALT 62*  --  59*  --   --   ALKPHOS 267*  --  214*  --   --   BILITOT 0.5  --  0.7  --   --   GFRNONAA 20*   < > 19* 25* 27*  ANIONGAP 9   < > 11 12 8    < > = values in this interval not displayed.    Lipids No results for input(s): "CHOL", "TRIG", "HDL", "LABVLDL", "LDLCALC", "CHOLHDL" in the last 168  hours.  Hematology Recent Labs  Lab 09/26/22 0529 09/27/22 0620 09/28/22 0043  WBC 6.4 8.8 8.2  RBC 3.94* 3.97* 3.66*  HGB 11.2* 11.3* 10.5*  HCT 35.7* 35.0* 32.6*  MCV 90.6 88.2 89.1  MCH 28.4 28.5 28.7  MCHC 31.4 32.3 32.2  RDW 14.1 13.8 13.9  PLT 119* 116* 119*   Thyroid  Recent Labs  Lab 09/26/22 1136  TSH 3.238    BNP Recent Labs  Lab 09/26/22 0529 09/27/22 0620 09/28/22 0043  BNP 418.7* 537.9* 416.3*    DDimer No results for input(s): "DDIMER" in the last 168 hours.   Radiology    DG Chest Port 1 View  Result Date: 09/28/2022 CLINICAL DATA:  Shortness of breath. EXAM: PORTABLE CHEST 1 VIEW COMPARISON:  09/26/2022 FINDINGS: There is been interval placement of a right IJ single lead pacer with tip of lead in the expected location of the right ventricle. Previous median sternotomy and CABG procedure. Stable cardiac enlargement. No pleural effusion or edema. No airspace opacities. IMPRESSION: 1. Stable cardiac enlargement. 2. No active lung disease. Electronically Signed   By: Kerby Moors M.D.   On: 09/28/2022 06:13   CARDIAC CATHETERIZATION  Result Date: 09/26/2022 Successful placement of transvenous single lead pacemaker in RV apex via RIJ access    Cardiac Studies   TTE 07/2022: IMPRESSIONS     1. Left ventricular ejection fraction, by estimation, is 55 to 60%.  The  left ventricle has normal function. The left ventricle has no regional  wall motion abnormalities. Left ventricular diastolic function could not  be evaluated. The average left  ventricular global longitudinal strain is -18.0 %. The global longitudinal  strain is normal.   2. Right ventricular systolic function is mildly reduced. The right  ventricular size is moderately enlarged. There is normal pulmonary artery  systolic pressure. The estimated right ventricular systolic pressure is  56.4 mmHg.   3. Left atrial size was moderately dilated.   4. Right atrial size was severely dilated.   5. The mitral valve is normal in structure. Mild mitral valve  regurgitation. No evidence of mitral stenosis.   6. Tricuspid valve regurgitation is mild to moderate.   7. The aortic valve is tricuspid. Aortic valve regurgitation is mild.   8. Aortic dilatation noted. There is severe dilatation sinus of valsalva,  noncoronary, measuring 61 mm.   9. The inferior vena cava is dilated in size with <50% respiratory  variability, suggesting right atrial pressure of 15 mmHg.   Comparison(s): No significant change from prior study. Prior images  reviewed side by side.   Conclusion(s)/Recommendation(s): Severe dilation at the sinus of Valsalva  (noncoronary cusp) measuring 61 mm (prior 60 mm).   Patient Profile     77 y.o. male with a hx of advanced multiple sclerosis, type A aortic dissection, right heart failure with severe TR and chronic Afib who presented with worsening AKI found to be in slow Afib with HR 20-40s for which Cardiology was consulted.  Assessment & Plan    #Profound Bradycardia: #Afib with slow ventricular response -Patient with HR 20-30s on admission in Afib with slow ventricular response; on metop 25mg  XL daily at that time -Suspect bradycardia was primary driver of worsening AKI and he is now s/p TVP placement on 1/13 -HR remain persistently in the 30s despite metop  washout -Plan for PPM today with EP -Apixaban has been held since 09/26/22 in anticipation for possible PPM   #Chronic diastolic/Right heart failure with severe TR  -  TTE 07/2022 with LVEF 66-06%, mild RV systolic dysfunction, moderate TR, RAP 15 - Symptomatically worse due to progressive multiple sclerosis. NYHA III - BNP 400s on admission; holding diuretics and farxiga due to significant bradycardia  - Will resume home meds following PPM given renal function back to baseline - Deemed not to be operative candidate for TR due to progressive MS   #History of Type A Aortic dissection s/p emergent surgical repair with chronic dissection and aneurysm:  - S/p hemi-arch replacement of the ascending thoracic aorta and resuspension of the native aortic valve 2000 - Now with chronic dissection and aneurysm involving sinus of Valsalva and aortic arch -Not a candidate for surgery due to progressive multiple sclerosis -On medical management and following with [facility]   #HTN  -  Resume home medications following PPM placement   #Severe multiple sclerosis - Has had progressive MS unfortunately - Follows with Neurology - Continue home meds      For questions or updates, please contact [place] Please consult [email or web address] for contact info under        Signed, Freada Bergeron, MD  09/28/2022, 7:13 AM

## 2022-09-27 NOTE — Progress Notes (Signed)
0220 CARDS FELLOW PAGED (DR GONZALEZ)CONCERNING BLEEDING RT IJ SHEATH SITE,ORDERS GIVEN TO HOLD HEPARIN FOR NOW(UNTIL MD ROUNDS IN AM)

## 2022-09-27 NOTE — Progress Notes (Signed)
PROGRESS NOTE                                                                                                                                                                                                             Patient Demographics:    [name], is a 77 y.o. male, DOB - [birth year], MVE:[id number]  Outpatient Primary MD for the patient is [name], MD    LOS - 2  Admit date - 09/25/2022    Chief Complaint  Patient presents with   Urinary Retention       Brief Narrative (HPI from H&P)   77 y.o. male with medical history significant for CKD 3B, chronic diastolic heart failure with chronic right-sided systolic heart failure, MS, paroxysmal atrial fibrillation chronically anticoagulated on Eliquis, anemia of chronic disease, who is admitted to [facility] on 09/25/2022 with acute kidney injury superimposed on CKD 3B after presenting from home to [facility] ED complaining of diminished urine output.   He was admitted for further evaluation.    During hospital stay he was found to have A-fib with slow ventricular response at times severe bradycardia suspicious for tachybradycardia, he was seen by cardiology underwent temporary pacemaker placement on 09/26/2022, he has been seen by cardiology and nephrology.  Under my care since 09/27/2022   Subjective:    [name] today has, No headache, No chest pain, No abdominal pain - No Nausea, No new weakness tingling or numbness, no SOB, has chronic spasms in both legs right worse than left.  Wants some baclofen for it.   Assessment  & Plan :   Atrial fibrillation with slow ventricular response, at times severe bradycardia.  Question tachybradycardia syndrome.  Was on metoprolol which has been held, TSH is normal, cardiology on board underwent temporary pacemaker placement on 09/26/2022 via right IJ.  Defer management of this issue to cardiology.  Chronic diastolic  heart failure EF 55% with history of underlying severe TR, history of type a aortic dissection s/p emergent surgical repair in 2000.  Currently stable, cardiology following.  As needed diuresis as required.  Beta-blocker held due to bradycardia.  MS with chronic bilateral lower extremity spasms and discomfort.  Home medication Copaxone shots per pharmacy, baclofen for supportive care.  PT OT.  Hypertension.  Blood pressure currently soft monitor off of medications.  Chronic  anemia and thrombocytopenia.  Mild and stable.  AKI on CKD 3A.  Baseline creatinine around 2.  Nephrology following.  Renal ultrasound nonacute.       Condition - Extremely Guarded  Family Communication  :  None  Code Status :  Full  Consults  :  Cards, Renal  PUD Prophylaxis :     Procedures  :     Temporary pacemaker placement via right IJ by cardiology on 09/26/2022.      Disposition Plan  :    Status is: Inpatient   DVT Prophylaxis  :    SCDs Start: 09/25/22 2011    Lab Results  Component Value Date   PLT 116 (L) 09/27/2022    Diet :  Diet Order             Diet regular Room service appropriate? Yes; Fluid consistency: Thin  Diet effective now                    Inpatient Medications  Scheduled Meds:  Chlorhexidine Gluconate Cloth  6 each Topical Daily   QUEtiapine  25 mg Oral QHS   Continuous Infusions:  heparin Stopped (09/27/22 0057)   PRN Meds:.acetaminophen **OR** acetaminophen, baclofen, melatonin  Antibiotics  :    Anti-infectives (From admission, onward)    None         Objective:   Vitals:   09/27/22 0600 09/27/22 0700 09/27/22 0752 09/27/22 0800  BP: 134/67 (!) 120/56  135/82  Pulse: 70 70  70  Resp: 17 15  18   Temp:   98.6 F (37 C)   TempSrc:   Oral   SpO2: 100% 100%  99%  Weight:      Height:        Wt Readings from Last 3 Encounters:  09/25/22 76.7 kg  06/20/21 81.8 kg  05/27/21 76.7 kg     Intake/Output Summary (Last 24 hours) at  09/27/2022 0849 Last data filed at 09/27/2022 0600 Gross per 24 hour  Intake 806.09 ml  Output 1350 ml  Net -543.91 ml     Physical Exam  Awake Alert, No new F.N deficits, Normal affect [place].AT,PERRAL Supple Neck, No JVD,   Symmetrical Chest wall movement, Good air movement bilaterally, CTAB RRR,No Gallops,Rubs or new Murmurs,  +ve B.Sounds, Abd Soft, No tenderness,   No Cyanosis, Clubbing or edema       Data Review:    Recent Labs  Lab 09/23/22 1402 09/25/22 1223 09/26/22 0529 09/27/22 0620  WBC 5.3 5.2 6.4 8.8  HGB 11.5* 10.7* 11.2* 11.3*  HCT 36.6* 34.1* 35.7* 35.0*  PLT 137* 113* 119* 116*  MCV 90.1 90.5 90.6 88.2  MCH 28.3 28.4 28.4 28.5  MCHC 31.4 31.4 31.4 32.3  RDW 13.9 14.0 14.1 13.8  LYMPHSABS 0.5* 0.6* 0.6*  --   MONOABS 0.4 0.7 0.7  --   EOSABS 0.0 0.1 0.1  --   BASOSABS 0.0 0.1 0.0  --     Recent Labs  Lab 09/23/22 1402 09/25/22 1223 09/26/22 0529 09/26/22 1136 09/27/22 0620  NA 135 139 137  --  140  K 5.6* 4.8 4.8  --  4.7  CL 103 105 106  --  110  CO2 23 22 20*  --  18*  ANIONGAP 9 12 11   --  12  GLUCOSE 102* 105* 106*  --  101*  BUN 82* 86* 94*  --  76*  CREATININE 3.14* 3.41* 3.30*  --  2.59*  AST 54*  --  42*  --   --   ALT 62*  --  59*  --   --   ALKPHOS 267*  --  214*  --   --   BILITOT 0.5  --  0.7  --   --   ALBUMIN 4.3  --  3.6  --   --   TSH  --   --   --  3.238  --   BNP 220.4*  --  418.7*  --  537.9*  MG  --   --  2.5*  --  2.2  CALCIUM 9.4 9.6 9.6  --  9.6    Recent Labs    09/26/22 1136  TSH 3.238     Radiology Reports CARDIAC CATHETERIZATION  Result Date: 09/26/2022 Successful placement of transvenous single lead pacemaker in RV apex via RIJ access   DG Chest Port 1 View  Result Date: 09/26/2022 CLINICAL DATA:  Urinary retention EXAM: PORTABLE CHEST 1 VIEW COMPARISON:  09/23/2022 FINDINGS: Cardiac shadow is enlarged. Postsurgical changes are again seen. Lungs are well aerated bilaterally. No bony abnormality is  seen. IMPRESSION: No acute abnormality noted. Electronically Signed   By: Inez Catalina M.D.   On: 09/26/2022 03:44   US Renal  Result Date: 09/25/2022 CLINICAL DATA:  Urinary retention. EXAM: RENAL / URINARY TRACT ULTRASOUND COMPLETE COMPARISON:  09/23/2022 ultrasound, 12/27/2017 CT FINDINGS: Right Kidney: Renal measurements: 9.6 x 6.0 x 6.4 centimeters = volume: 108.9 mL. Simple cyst in the mid to UPPER pole region is 5.5 centimeters. Simple cyst in the midpole region is 1.3 centimeters. No solid mass. No hydronephrosis. Renal parenchymal echogenicity is normal. Left Kidney: Renal measurements: 10.0 x 4.6 x 4.9 centimeters = volume: 123.4 mL. Multiple cysts are present, largest measuring 4.9 centimeters. No solid mass. No hydronephrosis. Normal renal echogenicity. Bladder: Appears normal for degree of bladder distention. Other: Study quality is degraded by overlying bowel gas, patient body habitus, and limited patient positioning. IMPRESSION: Bilateral renal cysts. No hydronephrosis. Electronically Signed   By: Nolon Nations M.D.   On: 09/25/2022 13:07   US Renal  Result Date: 09/23/2022 CLINICAL DATA:  Acute kidney insufficiency EXAM: RENAL / URINARY TRACT ULTRASOUND COMPLETE COMPARISON:  09/03/2021 renal ultrasound. Older CT scans 12/27/2017 and older FINDINGS: Right Kidney: Renal measurements: 9.9 x 6.9 x 5.5 = volume: 198.7 mL. Mild global atrophy and thinning of the parenchyma. There is a exophytic midportion anechoic structure measuring 4.5 x 3.6 x 4.5 cm consistent with a benign cyst. This was seen previously. No significant collecting system dilatation or perinephric fluid. Left Kidney: Renal measurements: 12.5 x 4.8 x 4.7 = volume: 146.8 mL. Mild global parenchymal atrophy. No collecting system dilatation. Simple appearing cysts also seen in the left kidney measuring up to 4.9 x 4.9 x 4.0 cm and a second focus measuring 4.0 x 3.9 x 3.6 cm. This was seen previously. Bladder: Trabeculated  thickened wall of the bladder. Bladder is mildly distended. Other: Limited visualization of the aorta and IVC. IMPRESSION: Mild renal atrophy. No collecting system dilatation. Bilateral renal cysts which appear simple. Electronically Signed   By: Jill Side M.D.   On: 09/23/2022 18:18   DG Chest 2 View  Result Date: 09/23/2022 CLINICAL DATA:  Weight gain. Congestive heart failure. Shortness of breath. End-stage renal failure. EXAM: CHEST - 2 VIEW COMPARISON:  AP chest 03/01/2020 FINDINGS: Status post median sternotomy. Cardiac silhouette is again mildly to moderately enlarged. Mediastinal contours are within normal limits.  The lungs are clear. No pleural effusion or pneumothorax. Mild lateral lower lung horizontal linear subsegmental atelectasis versus scarring. No pleural effusion or pneumothorax. Mild multilevel degenerative disc changes of the thoracic spine. IMPRESSION: 1. No acute lung process. 2. Mild to moderate cardiomegaly. Electronically Signed   By: Yvonne Kendall M.D.   On: 09/23/2022 12:36      Signature  -   Lala Lund M.D on 09/27/2022 at 8:49 AM   -  To page go to [email or web address]

## 2022-09-28 ENCOUNTER — Inpatient Hospital Stay (HOSPITAL_COMMUNITY): Payer: Medicare Other

## 2022-09-28 ENCOUNTER — Encounter (HOSPITAL_COMMUNITY)
Admission: EM | Disposition: A | Discharge status: Skilled Nursing Facility | Payer: Self-pay | Source: Home / Self Care | Attending: Internal Medicine

## 2022-09-28 ENCOUNTER — Other Ambulatory Visit: Payer: Self-pay

## 2022-09-28 ENCOUNTER — Encounter (HOSPITAL_COMMUNITY): Payer: Self-pay | Admitting: Cardiovascular Disease

## 2022-09-28 DIAGNOSIS — I442 Atrioventricular block, complete: Secondary | ICD-10-CM

## 2022-09-28 DIAGNOSIS — R001 Bradycardia, unspecified: Secondary | ICD-10-CM | POA: Diagnosis not present

## 2022-09-28 DIAGNOSIS — Z950 Presence of cardiac pacemaker: Secondary | ICD-10-CM | POA: Diagnosis not present

## 2022-09-28 DIAGNOSIS — N1832 Chronic kidney disease, stage 3b: Secondary | ICD-10-CM | POA: Diagnosis not present

## 2022-09-28 DIAGNOSIS — N179 Acute kidney failure, unspecified: Secondary | ICD-10-CM | POA: Diagnosis not present

## 2022-09-28 DIAGNOSIS — I482 Chronic atrial fibrillation, unspecified: Secondary | ICD-10-CM

## 2022-09-28 HISTORY — PX: PACEMAKER IMPLANT: EP1218

## 2022-09-28 LAB — CBC WITH DIFFERENTIAL/PLATELET
Abs Immature Granulocytes: 0.04 10*3/uL (ref 0.00–0.07)
Basophils Absolute: 0 10*3/uL (ref 0.0–0.1)
Basophils Relative: 0 %
Eosinophils Absolute: 0.1 10*3/uL (ref 0.0–0.5)
Eosinophils Relative: 1 %
HCT: 32.6 % — ABNORMAL LOW (ref 39.0–52.0)
Hemoglobin: 10.5 g/dL — ABNORMAL LOW (ref 13.0–17.0)
Immature Granulocytes: 1 %
Lymphocytes Relative: 5 %
Lymphs Abs: 0.4 10*3/uL — ABNORMAL LOW (ref 0.7–4.0)
MCH: 28.7 pg (ref 26.0–34.0)
MCHC: 32.2 g/dL (ref 30.0–36.0)
MCV: 89.1 fL (ref 80.0–100.0)
Monocytes Absolute: 1 10*3/uL (ref 0.1–1.0)
Monocytes Relative: 13 %
Neutro Abs: 6.6 10*3/uL (ref 1.7–7.7)
Neutrophils Relative %: 80 %
Platelets: 119 10*3/uL — ABNORMAL LOW (ref 150–400)
RBC: 3.66 MIL/uL — ABNORMAL LOW (ref 4.22–5.81)
RDW: 13.9 % (ref 11.5–15.5)
WBC: 8.2 10*3/uL (ref 4.0–10.5)
nRBC: 0 % (ref 0.0–0.2)

## 2022-09-28 LAB — BASIC METABOLIC PANEL
Anion gap: 8 (ref 5–15)
BUN: 66 mg/dL — ABNORMAL HIGH (ref 8–23)
CO2: 21 mmol/L — ABNORMAL LOW (ref 22–32)
Calcium: 9.2 mg/dL (ref 8.9–10.3)
Chloride: 111 mmol/L (ref 98–111)
Creatinine, Ser: 2.43 mg/dL — ABNORMAL HIGH (ref 0.61–1.24)
GFR, Estimated: 27 mL/min — ABNORMAL LOW (ref >60)
Glucose, Bld: 116 mg/dL — ABNORMAL HIGH (ref 70–99)
Potassium: 5 mmol/L (ref 3.5–5.1)
Sodium: 140 mmol/L (ref 135–145)

## 2022-09-28 LAB — SURGICAL PCR SCREEN
MRSA, PCR: NEGATIVE
Staphylococcus aureus: NEGATIVE

## 2022-09-28 LAB — TYPE AND SCREEN
ABO/RH(D): O POS
Antibody Screen: NEGATIVE

## 2022-09-28 LAB — BRAIN NATRIURETIC PEPTIDE: B Natriuretic Peptide: 416.3 pg/mL — ABNORMAL HIGH (ref 0.0–100.0)

## 2022-09-28 LAB — MAGNESIUM: Magnesium: 2.2 mg/dL (ref 1.7–2.4)

## 2022-09-28 SURGERY — PACEMAKER IMPLANT

## 2022-09-28 MED ORDER — SODIUM CHLORIDE 0.9 % IV SOLN
80.0000 mg | INTRAVENOUS | Status: AC
  Administered 2022-09-28: 80 mg

## 2022-09-28 MED ORDER — SODIUM ZIRCONIUM CYCLOSILICATE 10 G PO PACK
10.0000 g | PACK | Freq: Once | ORAL | Status: AC
  Administered 2022-09-28: 10 g via ORAL
  Filled 2022-09-28: qty 1

## 2022-09-28 MED ORDER — MIDAZOLAM HCL 5 MG/5ML IJ SOLN
INTRAMUSCULAR | Status: DC | PRN
  Administered 2022-09-28 (×2): 1 mg via INTRAVENOUS

## 2022-09-28 MED ORDER — LIDOCAINE HCL (PF) 1 % IJ SOLN
INTRAMUSCULAR | Status: DC | PRN
  Administered 2022-09-28: 60 mL

## 2022-09-28 MED ORDER — MIDAZOLAM HCL 5 MG/5ML IJ SOLN
INTRAMUSCULAR | Status: AC
  Filled 2022-09-28: qty 5

## 2022-09-28 MED ORDER — SODIUM CHLORIDE 0.9 % IV SOLN: INTRAVENOUS | Status: DC

## 2022-09-28 MED ORDER — FENTANYL CITRATE (PF) 100 MCG/2ML IJ SOLN
INTRAMUSCULAR | Status: DC | PRN
  Administered 2022-09-28: 12.5 ug via INTRAVENOUS

## 2022-09-28 MED ORDER — CHLORHEXIDINE GLUCONATE 4 % EX LIQD
60.0000 mL | Freq: Once | CUTANEOUS | Status: AC
  Administered 2022-09-28: 4 via TOPICAL
  Filled 2022-09-28: qty 60

## 2022-09-28 MED ORDER — IOHEXOL 350 MG/ML SOLN
INTRAVENOUS | Status: DC | PRN
  Administered 2022-09-28: 10 mL

## 2022-09-28 MED ORDER — CEFAZOLIN SODIUM-DEXTROSE 2-4 GM/100ML-% IV SOLN
2.0000 g | INTRAVENOUS | Status: AC
  Administered 2022-09-28: 2 g via INTRAVENOUS
  Filled 2022-09-28: qty 100

## 2022-09-28 MED ORDER — CEFAZOLIN SODIUM-DEXTROSE 2-4 GM/100ML-% IV SOLN
INTRAVENOUS | Status: AC
  Filled 2022-09-28: qty 100

## 2022-09-28 MED ORDER — ACETAMINOPHEN 325 MG PO TABS: 325.0000 mg | ORAL_TABLET | ORAL | Status: DC | PRN

## 2022-09-28 MED ORDER — SODIUM CHLORIDE 0.9 % IV SOLN
INTRAVENOUS | Status: AC
  Filled 2022-09-28: qty 2

## 2022-09-28 MED ORDER — CHLORHEXIDINE GLUCONATE 4 % EX LIQD: 60.0000 mL | Freq: Once | CUTANEOUS | Status: DC

## 2022-09-28 MED ORDER — CEFAZOLIN SODIUM-DEXTROSE 1-4 GM/50ML-% IV SOLN
1.0000 g | Freq: Three times a day (TID) | INTRAVENOUS | Status: AC
  Administered 2022-09-28 – 2022-09-29 (×2): 1 g via INTRAVENOUS
  Filled 2022-09-28 (×3): qty 50

## 2022-09-28 MED ORDER — FENTANYL CITRATE (PF) 100 MCG/2ML IJ SOLN
INTRAMUSCULAR | Status: AC
  Filled 2022-09-28: qty 2

## 2022-09-28 MED ORDER — ONDANSETRON HCL 4 MG/2ML IJ SOLN: 4.0000 mg | Freq: Four times a day (QID) | INTRAMUSCULAR | Status: DC | PRN

## 2022-09-28 MED ORDER — LIDOCAINE HCL 1 % IJ SOLN
INTRAMUSCULAR | Status: AC
  Filled 2022-09-28: qty 60

## 2022-09-28 SURGICAL SUPPLY — 8 items
CABLE SURGICAL S-101-97-12 (CABLE) ×2 IMPLANT
KIT ACCESSORY SELECTRA FIX CVD (MISCELLANEOUS) IMPLANT
LEAD SELECTRA 3D-65-42 (CATHETERS) IMPLANT
LEAD SOLIA S PRO MRI 60 (Lead) IMPLANT
PACEMAKER EDORA SR-T MRI (Pacemaker) IMPLANT
PAD DEFIB RADIO PHYSIO CONN (PAD) ×2 IMPLANT
SHEATH 9FR PRELUDE SNAP 13 (SHEATH) IMPLANT
TRAY PACEMAKER INSERTION (PACKS) ×2 IMPLANT

## 2022-09-28 NOTE — Progress Notes (Signed)
  Transition of Care [facility]) Screening Note   Patient Details  Name: [name] Date of Birth: 1945/12/24   Transition of [facility]) CM/SW Contact:    Ella Bodo, RN Phone Number: 09/28/2022, 4:50 PM    Transition of Care Department [facility]) has reviewed patient and no TOC needs have been identified at this time. We will continue to monitor patient advancement through interdisciplinary progression rounds. If new patient transition needs arise, please place a TOC consult.  Reinaldo Raddle, RN, BSN  Trauma/Neuro ICU Case Manager [phone]

## 2022-09-28 NOTE — Interval H&P Note (Signed)
History and Physical Interval Note:  09/28/2022 11:56 AM  [name]  has presented today for surgery, with the diagnosis of afib - slow avr.  The various methods of treatment have been discussed with the patient and family. After consideration of risks, benefits and other options for treatment, the patient has consented to  Procedure(s): PACEMAKER IMPLANT (N/A) as a surgical intervention.  The patient's history has been reviewed, patient examined, no change in status, stable for surgery.  I have reviewed the patient's chart and labs.  Questions were answered to the patient's satisfaction.     Cristopher Peru

## 2022-09-28 NOTE — Discharge Instructions (Addendum)
After Your Pacemaker   ACTIVITY Do not lift your arm above shoulder height for 1 week after your procedure. After 7 days, you may progress as below.  You should remove your sling 24 hours after your procedure, unless otherwise instructed by your provider.     Monday October 05, 2022  Tuesday October 06, 2022 Wednesday October 07, 2022 Thursday October 08, 2022   Do not lift, push, pull, or carry anything over 10 pounds with the affected arm until 6 weeks (Monday November 09, 2022 ) after your procedure.   You may drive AFTER your wound check, unless you have been told otherwise by your provider.   Ask your healthcare provider when you can go back to work   INCISION/Dressing If you are on a blood thinner such as Coumadin, Xarelto, Eliquis, Plavix, or Pradaxa please confirm with your provider when this should be resumed. (October 04, 2022 per Dr. Lovena Le)  If large square, outer bandage is left in place, this can be removed after 24 hours from your procedure. Do not remove steri-strips or glue as below.   Monitor your Pacemaker site for redness, swelling, and drainage. Call the device clinic at [phone] if you experience these symptoms or fever/chills.  If your incision is sealed with Steri-strips or staples, you may shower 7 days after your procedure or when told by your provider. Do not remove the steri-strips or let the shower hit directly on your site. You may wash around your site with soap and water.    If you were discharged in a sling, please do not wear this during the day more than 48 hours after your surgery unless otherwise instructed. This may increase the risk of stiffness and soreness in your shoulder.   Avoid lotions, ointments, or perfumes over your incision until it is well-healed.  You may use a hot tub or a pool AFTER your wound check appointment if the incision is completely closed.  Pacemaker Alerts:  Some alerts are vibratory and others beep. These are NOT  emergencies. Please call our office to let us know. If this occurs at night or on weekends, it can wait until the next business day. Send a remote transmission.  If your device is capable of reading fluid status (for heart failure), you will be offered monthly monitoring to review this with you.   DEVICE MANAGEMENT Remote monitoring is used to monitor your pacemaker from home. This monitoring is scheduled every 91 days by our office. It allows [place] to keep an eye on the functioning of your device to ensure it is working properly. You will routinely see your Electrophysiologist annually (more often if necessary).   You should receive your ID card for your new device in 4-8 weeks. Keep this card with you at all times once received. Consider wearing a medical alert bracelet or necklace.  Your Pacemaker may be MRI compatible. This will be discussed at your next office visit/wound check.  You should avoid contact with strong electric or magnetic fields.   Do not use amateur (ham) radio equipment or electric (arc) welding torches. MP3 player headphones with magnets should not be used. Some devices are safe to use if held at least 12 inches (30 cm) from your Pacemaker. These include power tools, lawn mowers, and speakers. If you are unsure if something is safe to use, ask your health care provider.  When using your cell phone, hold it to the ear that is on the opposite side from the Pacemaker.  Do not leave your cell phone in a pocket over the Pacemaker.  You may safely use electric blankets, heating pads, computers, and microwave ovens.  Call the office right away if: You have chest pain. You feel more short of breath than you have felt before. You feel more light-headed than you have felt before. Your incision starts to open up.  This information is not intended to replace advice given to you by your health care provider. Make sure you discuss any questions you have with your health care provider.     Follow with Primary MD Timothy Low, MD in 7 days   Get CBC, CMP, Magnesium, 2 view Chest X ray -  checked next visit with your primary MD or SNF MD    Activity: As tolerated with Full fall precautions use walker/cane & assistance as needed  Disposition SNF  Diet: Heart Healthy    Special Instructions: If you have smoked or chewed Tobacco  in the last 2 yrs please stop smoking, stop any regular Alcohol  and or any Recreational drug use.  On your next visit with your primary care physician please Get Medicines reviewed and adjusted.  Please request your Prim.MD to go over all Hospital Tests and Procedure/Radiological results at the follow up, please get all Hospital records sent to your Prim MD by signing hospital release before you go home.  If you experience worsening of your admission symptoms, develop shortness of breath, life threatening emergency, suicidal or homicidal thoughts you must seek medical attention immediately by calling 911 or calling your MD immediately  if symptoms less severe.  You Must read complete instructions/literature along with all the possible adverse reactions/side effects for all the Medicines you take and that have been prescribed to you. Take any new Medicines after you have completely understood and accpet all the possible adverse reactions/side effects.    =================================================================================================== Information on my medicine - ELIQUIS (apixaban) - To resume on October 04, 2022 per Dr. Lovena Le  This medication education was reviewed with me or my healthcare representative as part of my discharge preparation.   Why was Eliquis prescribed for you? Eliquis was prescribed for you to reduce the risk of a blood clot forming that can cause a stroke if you have a medical condition called atrial fibrillation (a type of irregular heartbeat).  What do You need to know about Eliquis ? Take your  Eliquis TWICE DAILY - one tablet in the morning and one tablet in the evening with or without food. If you have difficulty swallowing the tablet whole please discuss with your pharmacist how to take the medication safely.  Take Eliquis exactly as prescribed by your doctor and DO NOT stop taking Eliquis without talking to the doctor who prescribed the medication.  Stopping may increase your risk of developing a stroke.  Refill your prescription before you run out.  After discharge, you should have regular check-up appointments with your healthcare provider that is prescribing your Eliquis.  In the future your dose may need to be changed if your kidney function or weight changes by a significant amount or as you get older.  What do you do if you miss a dose? If you miss a dose, take it as soon as you remember on the same day and resume taking twice daily.  Do not take more than one dose of ELIQUIS at the same time to make up a missed dose.  Important Safety Information A possible side effect of Eliquis is bleeding.  You should call your healthcare provider right away if you experience any of the following: Bleeding from an injury or your nose that does not stop. Unusual colored urine (red or dark brown) or unusual colored stools (red or black). Unusual bruising for unknown reasons. A serious fall or if you hit your head (even if there is no bleeding).  Some medicines may interact with Eliquis and might increase your risk of bleeding or clotting while on Eliquis. To help avoid this, consult your healthcare provider or pharmacist prior to using any new prescription or non-prescription medications, including herbals, vitamins, non-steroidal anti-inflammatory drugs (NSAIDs) and supplements.  This website has more information on Eliquis (apixaban): http://www.eliquis.com/eliquis/home

## 2022-09-28 NOTE — H&P (View-Only) (Signed)
   Electrophysiology Rounding Note  Patient Name: [name] Date of Encounter: 09/28/2022  Primary Cardiologist: None Electrophysiologist: New   Subjective   The patient is doing OK this am.  At this time, the patient denies chest pain, shortness of breath, or any new concerns.  Inpatient Medications    Scheduled Meds:  Chlorhexidine Gluconate Cloth  6 each Topical Daily   cholecalciferol  2,000 Units Oral Daily   folic acid  1 mg Oral Daily   [START ON 09/30/2022] Glatiramer Acetate  40 mg Subcutaneous Once per day on Mon Wed Fri   QUEtiapine  25 mg Oral QHS   sodium zirconium cyclosilicate  10 g Oral Once   Continuous Infusions:  PRN Meds: acetaminophen **OR** acetaminophen, baclofen, melatonin   Vital Signs    Vitals:   09/28/22 0500 09/28/22 0600 09/28/22 0700 09/28/22 0731  BP: (!) 126/110 117/65 (!) 142/57   Pulse: 70 69 60 60  Resp: (!) 27 14 15 17   Temp:    97.9 F (36.6 C)  TempSrc:    Oral  SpO2: 97% 98% 97% 95%  Weight:      Height:        Intake/Output Summary (Last 24 hours) at 09/28/2022 0824 Last data filed at 09/28/2022 0400 Gross per 24 hour  Intake 720 ml  Output 1050 ml  Net -330 ml   Filed Weights   09/25/22 1206  Weight: 76.7 kg    Physical Exam    GEN- The patient is well appearing, alert and oriented x 3 today.   HEENT- No gross abnormality.  Lungs- Clear to ausculation bilaterally, normal work of breathing Heart- Irregularly irregular rate and rhythm, no murmurs, rubs or gallops GI- soft, NT, ND, + BS Extremities- no clubbing or cyanosis. No edema Neuro- No obvious focal abnormality.   Telemetry    AF with paced rates in 60-70s, 30s underlying (personally reviewed)   Patient Profile     [name] is a 77 y.o. male with a hx of Type A Aortic dissection in 2000 s/p hemiarch replacement, severe multiple sclerosis, chronic diastolic HF and RV failure with severe TR, chronic AF on Eliquis who is being seen  09/27/2022 for the evaluation of AF with slow ventricular response at the request of Dr Johney Frame.   Pt presented to [facility] with brady in the 20s and poor urine output, found to have marked AKI and AF with slow VR. Transferred to [facility] for TVP placement.   Assessment & Plan    AF w slow VR Remains pacer dependent this am with escape in 30s Avoid AV nodal agents.  Heparin on hold.  Explained risks, benefits, and alternatives to PPM implantation, including but not limited to bleeding, infection, pneumothorax, pericardial effusion, lead dislodgement, heart attack, stroke, or death.  Pt verbalized understanding and agrees to proceed.  2. AKI Creatinine, ser  2.43* (01/15 0043) Baseline appears ~2.0-2.3  For questions or updates, please contact [place] Please consult [email or web address] for contact info under Cardiology/STEMI.  Signed, Shirley Friar, PA-C  09/28/2022, 8:24 AM   EP Attending  Patient seen and examined. Agree with above. The patient is doing well and his temp pm is working normally. He is V paced at 80/min. I have discussed the indications/risks/benefits/goals/expectations of PPM insertion and he wishes to proceed.   Carleene Overlie Rollo Farquhar,MD

## 2022-09-28 NOTE — Progress Notes (Signed)
PROGRESS NOTE                                                                                                                                                                                                             Patient Demographics:    [name], is a 77 y.o. male, DOB - 05/23/1946, LDJ:[id number]  Outpatient Primary MD for the patient is [name], MD    LOS - 3  Admit date - 09/25/2022    Chief Complaint  Patient presents with   Urinary Retention       Brief Narrative (HPI from H&P)   77 y.o. male with medical history significant for CKD 3B, chronic diastolic heart failure with chronic right-sided systolic heart failure, MS, paroxysmal atrial fibrillation chronically anticoagulated on Eliquis, anemia of chronic disease, who is admitted to [facility] on 09/25/2022 with acute kidney injury superimposed on CKD 3B after presenting from home to [facility] ED complaining of diminished urine output.   He was admitted for further evaluation.    During hospital stay he was found to have A-fib with slow ventricular response at times severe bradycardia suspicious for tachybradycardia, he was seen by cardiology underwent temporary pacemaker placement on 09/26/2022, he has been seen by cardiology and nephrology.  Under my care since 09/27/2022   Subjective:   Patient in bed, appears comfortable, denies any headache, no fever, no chest pain or pressure, no shortness of breath , no abdominal pain. No new focal weakness.  Leg spasms are better.   Assessment  & Plan :   Atrial fibrillation with slow ventricular response, at times severe bradycardia.  Question tachybradycardia syndrome.  Was on metoprolol which has been held, TSH is normal, cardiology on board underwent temporary pacemaker placement on 09/26/2022 via right IJ.  Defer management of this issue to cardiology.  Likely for permanent pacemaker placement on  09/28/2022.  Chronic diastolic heart failure EF 55% with history of underlying severe TR, history of type a aortic dissection s/p emergent surgical repair in 2000.  Currently stable, cardiology following.  As needed diuresis as required.  Beta-blocker held due to bradycardia.  MS with chronic bilateral lower extremity spasms and discomfort.  Home medication Copaxone shots per pharmacy, baclofen for supportive care.  PT OT.  Hypertension.  Blood pressure currently soft monitor off of medications.  Chronic  anemia and thrombocytopenia.  Mild and stable.  Type screen done as well.  AKI on CKD 3A.  Baseline creatinine around 2.  Nephrology following.  Renal ultrasound nonacute.       Condition - Extremely Guarded  Family Communication  :  wife Neoma Laming [phone] on 09/28/2022 message left at 7:59 AM.  Code Status :  Full  Consults  :  Cards, Renal  PUD Prophylaxis :     Procedures  :     Temporary pacemaker placement via right IJ by cardiology on 09/26/2022.      Disposition Plan  :    Status is: Inpatient   DVT Prophylaxis  :    Place and maintain sequential compression device Start: 09/27/22 0854 SCDs Start: 09/25/22 2011    Lab Results  Component Value Date   PLT 119 (L) 09/28/2022    Diet :  Diet Order             Diet NPO time specified  Diet effective now                    Inpatient Medications  Scheduled Meds:  Chlorhexidine Gluconate Cloth  6 each Topical Daily   cholecalciferol  2,000 Units Oral Daily   folic acid  1 mg Oral Daily   [START ON 09/30/2022] Glatiramer Acetate  40 mg Subcutaneous Once per day on Mon Wed Fri   QUEtiapine  25 mg Oral QHS   sodium zirconium cyclosilicate  10 g Oral Once   Continuous Infusions:   PRN Meds:.acetaminophen **OR** acetaminophen, baclofen, melatonin  Antibiotics  :    Anti-infectives (From admission, onward)    None         Objective:   Vitals:   09/28/22 0500 09/28/22 0600 09/28/22 0700  09/28/22 0731  BP: (!) 126/110 117/65 (!) 142/57   Pulse: 70 69 60 60  Resp: (!) 27 14 15 17   Temp:    97.9 F (36.6 C)  TempSrc:    Oral  SpO2: 97% 98% 97% 95%  Weight:      Height:        Wt Readings from Last 3 Encounters:  09/25/22 76.7 kg  06/20/21 81.8 kg  05/27/21 76.7 kg     Intake/Output Summary (Last 24 hours) at 09/28/2022 0758 Last data filed at 09/28/2022 0400 Gross per 24 hour  Intake 840 ml  Output 1050 ml  Net -210 ml     Physical Exam  Awake, at times mildly confused no new F.N deficits, right IJ temporary pacemaker [place].AT,PERRAL Supple Neck, No JVD,   Symmetrical Chest wall movement, Good air movement bilaterally, CTAB RRR,No Gallops,Rubs or new Murmurs,  +ve B.Sounds, Abd Soft, No tenderness,   No Cyanosis, Clubbing or edema       Data Review:    Recent Labs  Lab 09/23/22 1402 09/25/22 1223 09/26/22 0529 09/27/22 0620 09/28/22 0043  WBC 5.3 5.2 6.4 8.8 8.2  HGB 11.5* 10.7* 11.2* 11.3* 10.5*  HCT 36.6* 34.1* 35.7* 35.0* 32.6*  PLT 137* 113* 119* 116* 119*  MCV 90.1 90.5 90.6 88.2 89.1  MCH 28.3 28.4 28.4 28.5 28.7  MCHC 31.4 31.4 31.4 32.3 32.2  RDW 13.9 14.0 14.1 13.8 13.9  LYMPHSABS 0.5* 0.6* 0.6*  --  0.4*  MONOABS 0.4 0.7 0.7  --  1.0  EOSABS 0.0 0.1 0.1  --  0.1  BASOSABS 0.0 0.1 0.0  --  0.0    Recent Labs  Lab 09/23/22 1402  09/25/22 1223 09/26/22 0529 09/26/22 1136 09/27/22 0620 09/28/22 0043  NA 135 139 137  --  140 140  K 5.6* 4.8 4.8  --  4.7 5.0  CL 103 105 106  --  110 111  CO2 23 22 20*  --  18* 21*  ANIONGAP 9 12 11   --  12 8  GLUCOSE 102* 105* 106*  --  101* 116*  BUN 82* 86* 94*  --  76* 66*  CREATININE 3.14* 3.41* 3.30*  --  2.59* 2.43*  AST 54*  --  42*  --   --   --   ALT 62*  --  59*  --   --   --   ALKPHOS 267*  --  214*  --   --   --   BILITOT 0.5  --  0.7  --   --   --   ALBUMIN 4.3  --  3.6  --   --   --   TSH  --   --   --  3.238  --   --   BNP 220.4*  --  418.7*  --  537.9* 416.3*  MG  --    --  2.5*  --  2.2 2.2  CALCIUM 9.4 9.6 9.6  --  9.6 9.2    Recent Labs    09/26/22 1136  TSH 3.238     Radiology Reports DG Chest Port 1 View  Result Date: 09/28/2022 CLINICAL DATA:  Shortness of breath. EXAM: PORTABLE CHEST 1 VIEW COMPARISON:  09/26/2022 FINDINGS: There is been interval placement of a right IJ single lead pacer with tip of lead in the expected location of the right ventricle. Previous median sternotomy and CABG procedure. Stable cardiac enlargement. No pleural effusion or edema. No airspace opacities. IMPRESSION: 1. Stable cardiac enlargement. 2. No active lung disease. Electronically Signed   By: Kerby Moors M.D.   On: 09/28/2022 06:13   CARDIAC CATHETERIZATION  Result Date: 09/26/2022 Successful placement of transvenous single lead pacemaker in RV apex via RIJ access   DG Chest Port 1 View  Result Date: 09/26/2022 CLINICAL DATA:  Urinary retention EXAM: PORTABLE CHEST 1 VIEW COMPARISON:  09/23/2022 FINDINGS: Cardiac shadow is enlarged. Postsurgical changes are again seen. Lungs are well aerated bilaterally. No bony abnormality is seen. IMPRESSION: No acute abnormality noted. Electronically Signed   By: Inez Catalina M.D.   On: 09/26/2022 03:44   US Renal  Result Date: 09/25/2022 CLINICAL DATA:  Urinary retention. EXAM: RENAL / URINARY TRACT ULTRASOUND COMPLETE COMPARISON:  09/23/2022 ultrasound, 12/27/2017 CT FINDINGS: Right Kidney: Renal measurements: 9.6 x 6.0 x 6.4 centimeters = volume: 108.9 mL. Simple cyst in the mid to UPPER pole region is 5.5 centimeters. Simple cyst in the midpole region is 1.3 centimeters. No solid mass. No hydronephrosis. Renal parenchymal echogenicity is normal. Left Kidney: Renal measurements: 10.0 x 4.6 x 4.9 centimeters = volume: 123.4 mL. Multiple cysts are present, largest measuring 4.9 centimeters. No solid mass. No hydronephrosis. Normal renal echogenicity. Bladder: Appears normal for degree of bladder distention. Other: Study quality  is degraded by overlying bowel gas, patient body habitus, and limited patient positioning. IMPRESSION: Bilateral renal cysts. No hydronephrosis. Electronically Signed   By: Nolon Nations M.D.   On: 09/25/2022 13:07      Signature  -   Lala Lund M.D on 09/28/2022 at 7:58 AM   -  To page go to [email or web address]

## 2022-09-28 NOTE — Progress Notes (Addendum)
   Electrophysiology Rounding Note  Patient Name: [name] Date of Encounter: 09/28/2022  Primary Cardiologist: None Electrophysiologist: New   Subjective   The patient is doing OK this am.  At this time, the patient denies chest pain, shortness of breath, or any new concerns.  Inpatient Medications    Scheduled Meds:  Chlorhexidine Gluconate Cloth  6 each Topical Daily   cholecalciferol  2,000 Units Oral Daily   folic acid  1 mg Oral Daily   [START ON 09/30/2022] Glatiramer Acetate  40 mg Subcutaneous Once per day on Mon Wed Fri   QUEtiapine  25 mg Oral QHS   sodium zirconium cyclosilicate  10 g Oral Once   Continuous Infusions:  PRN Meds: acetaminophen **OR** acetaminophen, baclofen, melatonin   Vital Signs    Vitals:   09/28/22 0500 09/28/22 0600 09/28/22 0700 09/28/22 0731  BP: (!) 126/110 117/65 (!) 142/57   Pulse: 70 69 60 60  Resp: (!) 27 14 15 17   Temp:    97.9 F (36.6 C)  TempSrc:    Oral  SpO2: 97% 98% 97% 95%  Weight:      Height:        Intake/Output Summary (Last 24 hours) at 09/28/2022 0824 Last data filed at 09/28/2022 0400 Gross per 24 hour  Intake 720 ml  Output 1050 ml  Net -330 ml   Filed Weights   09/25/22 1206  Weight: 76.7 kg    Physical Exam    GEN- The patient is well appearing, alert and oriented x 3 today.   HEENT- No gross abnormality.  Lungs- Clear to ausculation bilaterally, normal work of breathing Heart- Irregularly irregular rate and rhythm, no murmurs, rubs or gallops GI- soft, NT, ND, + BS Extremities- no clubbing or cyanosis. No edema Neuro- No obvious focal abnormality.   Telemetry    AF with paced rates in 60-70s, 30s underlying (personally reviewed)   Patient Profile     [name] is a 77 y.o. male with a hx of Type A Aortic dissection in 2000 s/p hemiarch replacement, severe multiple sclerosis, chronic diastolic HF and RV failure with severe TR, chronic AF on Eliquis who is being seen  09/27/2022 for the evaluation of AF with slow ventricular response at the request of Dr Johney Frame.   Pt presented to [facility] with brady in the 20s and poor urine output, found to have marked AKI and AF with slow VR. Transferred to [facility] for TVP placement.   Assessment & Plan    AF w slow VR Remains pacer dependent this am with escape in 30s Avoid AV nodal agents.  Heparin on hold.  Explained risks, benefits, and alternatives to PPM implantation, including but not limited to bleeding, infection, pneumothorax, pericardial effusion, lead dislodgement, heart attack, stroke, or death.  Pt verbalized understanding and agrees to proceed.  2. AKI Creatinine, ser  2.43* (01/15 0043) Baseline appears ~2.0-2.3  For questions or updates, please contact [place] Please consult [email or web address] for contact info under Cardiology/STEMI.  Signed, Shirley Friar, PA-C  09/28/2022, 8:24 AM   EP Attending  Patient seen and examined. Agree with above. The patient is doing well and his temp pm is working normally. He is V paced at 80/min. I have discussed the indications/risks/benefits/goals/expectations of PPM insertion and he wishes to proceed.   Carleene Overlie Leathia Farnell,MD

## 2022-09-29 ENCOUNTER — Inpatient Hospital Stay (HOSPITAL_COMMUNITY): Payer: Medicare Other

## 2022-09-29 ENCOUNTER — Telehealth: Payer: Medicare Other | Admitting: Diagnostic Neuroimaging

## 2022-09-29 ENCOUNTER — Encounter (HOSPITAL_COMMUNITY): Payer: Self-pay | Admitting: Internal Medicine

## 2022-09-29 DIAGNOSIS — G9341 Metabolic encephalopathy: Secondary | ICD-10-CM | POA: Diagnosis not present

## 2022-09-29 DIAGNOSIS — I5032 Chronic diastolic (congestive) heart failure: Secondary | ICD-10-CM | POA: Diagnosis not present

## 2022-09-29 DIAGNOSIS — I442 Atrioventricular block, complete: Secondary | ICD-10-CM | POA: Diagnosis not present

## 2022-09-29 DIAGNOSIS — R001 Bradycardia, unspecified: Secondary | ICD-10-CM | POA: Diagnosis not present

## 2022-09-29 DIAGNOSIS — I495 Sick sinus syndrome: Secondary | ICD-10-CM | POA: Diagnosis not present

## 2022-09-29 DIAGNOSIS — D696 Thrombocytopenia, unspecified: Secondary | ICD-10-CM | POA: Diagnosis not present

## 2022-09-29 DIAGNOSIS — N179 Acute kidney failure, unspecified: Secondary | ICD-10-CM | POA: Diagnosis not present

## 2022-09-29 DIAGNOSIS — Z950 Presence of cardiac pacemaker: Secondary | ICD-10-CM | POA: Diagnosis not present

## 2022-09-29 DIAGNOSIS — I10 Essential (primary) hypertension: Secondary | ICD-10-CM | POA: Diagnosis not present

## 2022-09-29 DIAGNOSIS — N1832 Chronic kidney disease, stage 3b: Secondary | ICD-10-CM | POA: Diagnosis not present

## 2022-09-29 LAB — CBC WITH DIFFERENTIAL/PLATELET
Abs Immature Granulocytes: 0.03 10*3/uL (ref 0.00–0.07)
Basophils Absolute: 0 10*3/uL (ref 0.0–0.1)
Basophils Relative: 0 %
Eosinophils Absolute: 0.1 10*3/uL (ref 0.0–0.5)
Eosinophils Relative: 1 %
HCT: 35.2 % — ABNORMAL LOW (ref 39.0–52.0)
Hemoglobin: 11.4 g/dL — ABNORMAL LOW (ref 13.0–17.0)
Immature Granulocytes: 0 %
Lymphocytes Relative: 5 %
Lymphs Abs: 0.5 10*3/uL — ABNORMAL LOW (ref 0.7–4.0)
MCH: 29.1 pg (ref 26.0–34.0)
MCHC: 32.4 g/dL (ref 30.0–36.0)
MCV: 89.8 fL (ref 80.0–100.0)
Monocytes Absolute: 1 10*3/uL (ref 0.1–1.0)
Monocytes Relative: 12 %
Neutro Abs: 7 10*3/uL (ref 1.7–7.7)
Neutrophils Relative %: 82 %
Platelets: 118 10*3/uL — ABNORMAL LOW (ref 150–400)
RBC: 3.92 MIL/uL — ABNORMAL LOW (ref 4.22–5.81)
RDW: 14.1 % (ref 11.5–15.5)
WBC: 8.6 10*3/uL (ref 4.0–10.5)
nRBC: 0 % (ref 0.0–0.2)

## 2022-09-29 LAB — BASIC METABOLIC PANEL
Anion gap: 10 (ref 5–15)
BUN: 57 mg/dL — ABNORMAL HIGH (ref 8–23)
CO2: 21 mmol/L — ABNORMAL LOW (ref 22–32)
Calcium: 10 mg/dL (ref 8.9–10.3)
Chloride: 111 mmol/L (ref 98–111)
Creatinine, Ser: 2.07 mg/dL — ABNORMAL HIGH (ref 0.61–1.24)
GFR, Estimated: 33 mL/min — ABNORMAL LOW (ref >60)
Glucose, Bld: 128 mg/dL — ABNORMAL HIGH (ref 70–99)
Potassium: 4.6 mmol/L (ref 3.5–5.1)
Sodium: 142 mmol/L (ref 135–145)

## 2022-09-29 LAB — BRAIN NATRIURETIC PEPTIDE: B Natriuretic Peptide: 471.8 pg/mL — ABNORMAL HIGH (ref 0.0–100.0)

## 2022-09-29 LAB — MAGNESIUM: Magnesium: 2.2 mg/dL (ref 1.7–2.4)

## 2022-09-29 LAB — GLUCOSE, CAPILLARY: Glucose-Capillary: 129 mg/dL — ABNORMAL HIGH (ref 70–99)

## 2022-09-29 MED ORDER — METOPROLOL SUCCINATE ER 25 MG PO TB24
25.0000 mg | ORAL_TABLET | Freq: Every day | ORAL | Status: DC
  Administered 2022-09-29 – 2022-10-02 (×4): 25 mg via ORAL
  Filled 2022-09-29 (×4): qty 1

## 2022-09-29 MED ORDER — BACLOFEN 5 MG HALF TABLET
5.0000 mg | ORAL_TABLET | Freq: Four times a day (QID) | ORAL | Status: DC | PRN
  Administered 2022-09-29 – 2022-09-30 (×4): 5 mg via ORAL
  Filled 2022-09-29 (×5): qty 1

## 2022-09-29 MED ORDER — LOSARTAN POTASSIUM 50 MG PO TABS: 100.0000 mg | ORAL_TABLET | Freq: Every day | ORAL | Status: DC

## 2022-09-29 MED ORDER — DIAZEPAM 5 MG/ML IJ SOLN
5.0000 mg | Freq: Once | INTRAMUSCULAR | Status: AC | PRN
  Administered 2022-09-29: 5 mg via INTRAVENOUS
  Filled 2022-09-29: qty 2

## 2022-09-29 MED ORDER — APIXABAN 5 MG PO TABS: 5.0000 mg | ORAL_TABLET | Freq: Two times a day (BID) | ORAL | Status: DC

## 2022-09-29 NOTE — Plan of Care (Signed)
  Problem: Education: Goal: Knowledge of General Education information will improve Description: Including pain rating scale, medication(s)/side effects and non-pharmacologic comfort measures Outcome: Progressing   Problem: Health Behavior/Discharge Planning: Goal: Ability to manage health-related needs will improve Outcome: Progressing   Problem: Clinical Measurements: Goal: Ability to maintain clinical measurements within normal limits will improve Outcome: Progressing Goal: Will remain free from infection Outcome: Progressing Goal: Diagnostic test results will improve Outcome: Progressing Goal: Respiratory complications will improve Outcome: Progressing Goal: Cardiovascular complication will be avoided Outcome: Progressing   Problem: Activity: Goal: Risk for activity intolerance will decrease Outcome: Progressing   Problem: Nutrition: Goal: Adequate nutrition will be maintained Outcome: Progressing   Problem: Coping: Goal: Level of anxiety will decrease Outcome: Progressing   Problem: Elimination: Goal: Will not experience complications related to bowel motility Outcome: Progressing Goal: Will not experience complications related to urinary retention Outcome: Progressing   Problem: Pain Managment: Goal: General experience of comfort will improve Outcome: Progressing   Problem: Safety: Goal: Ability to remain free from injury will improve Outcome: Progressing   Problem: Skin Integrity: Goal: Risk for impaired skin integrity will decrease Outcome: Progressing   Problem: Education: Goal: Understanding of CV disease, CV risk reduction, and recovery process will improve Outcome: Progressing Goal: Individualized Educational Video(s) Outcome: Progressing   Problem: Activity: Goal: Ability to return to baseline activity level will improve Outcome: Progressing   Problem: Cardiovascular: Goal: Ability to achieve and maintain adequate cardiovascular perfusion  will improve Outcome: Progressing Goal: Vascular access site(s) Level 0-1 will be maintained Outcome: Progressing   Problem: Health Behavior/Discharge Planning: Goal: Ability to safely manage health-related needs after discharge will improve Outcome: Progressing   Problem: Education: Goal: Knowledge of cardiac device and self-care will improve Outcome: Progressing Goal: Ability to safely manage health related needs after discharge will improve Outcome: Progressing Goal: Individualized Educational Video(s) Outcome: Progressing   Problem: Cardiac: Goal: Ability to achieve and maintain adequate cardiopulmonary perfusion will improve Outcome: Progressing   

## 2022-09-29 NOTE — Progress Notes (Addendum)
Patient complaining of severe muscle spasms despite baclofen and has become agitated, yelling at staff. Plan to give a dose of Valium for muscle spasms refractory to baclofen.   ADDENDUM: Pt remains severely agitated. Antipsychotics had been avoided initially d/t prolonged QT on recent EKG but he is noted to have normal QT interval on the monitor currently. Plan to give Haldol.

## 2022-09-29 NOTE — Progress Notes (Addendum)
Rounding Note    Patient Name: [name] Date of Encounter: 09/29/2022  [place] Cardiologist: None   Subjective   Ppm working well  He has delerium  Inpatient Medications    Scheduled Meds:  [START ON 10/04/2022] apixaban  5 mg Oral BID   Chlorhexidine Gluconate Cloth  6 each Topical Daily   cholecalciferol  2,000 Units Oral Daily   folic acid  1 mg Oral Daily   [START ON 09/30/2022] Glatiramer Acetate  40 mg Subcutaneous Once per day on Mon Wed Fri   metoprolol succinate  25 mg Oral Daily   QUEtiapine  25 mg Oral QHS   Continuous Infusions:  PRN Meds: acetaminophen **OR** acetaminophen, baclofen, melatonin, ondansetron (ZOFRAN) IV   Vital Signs    Vitals:   09/29/22 0500 09/29/22 0600 09/29/22 0700 09/29/22 0747  BP: (!) 109/54 135/70 130/60   Pulse: 75 74 78   Resp: 13 16 (!) 21   Temp:    98.2 F (36.8 C)  TempSrc:    Oral  SpO2: 97% 99% 92%   Weight:      Height:        Intake/Output Summary (Last 24 hours) at 09/29/2022 0838 Last data filed at 09/29/2022 0700 Gross per 24 hour  Intake 568.1 ml  Output 1050 ml  Net -481.9 ml      09/29/2022    3:00 AM 09/25/2022   12:06 PM 06/20/2021    2:55 PM  Last 3 Weights  Weight (lbs) 184 lb 4.9 oz 169 lb 180 lb 6.4 oz  Weight (kg) 83.6 kg 76.658 kg 81.829 kg      Telemetry    V-paced - Personally Reviewed  ECG    09/26/22: V-paced with PVCs - Personally Reviewed  Physical Exam   GEN: No acute distress.  Sitting up in bed. Alert. Drooling; delerious Cardiac: RRR, exam limited, patient agitated  Respiratory: nl wob GI: Soft, nontender, non-distended  MS: No edema; No deformity. Neuro:  Nonfocal  Psych: Normal affect   Labs    High Sensitivity Troponin:  No results for input(s): "TROPONINIHS" in the last 720 hours.   Chemistry Recent Labs  Lab 09/23/22 1402 09/25/22 1223 09/26/22 0529 09/26/22 1129 09/27/22 0620 09/28/22 0043 09/29/22 0405  NA 135   < > 137   < >  140 140 142  K 5.6*   < > 4.8   < > 4.7 5.0 4.6  CL 103   < > 106   < > 110 111 111  CO2 23   < > 20*   < > 18* 21* 21*  GLUCOSE 102*   < > 106*   < > 101* 116* 128*  BUN 82*   < > 94*   < > 76* 66* 57*  CREATININE 3.14*   < > 3.30*   < > 2.59* 2.43* 2.07*  CALCIUM 9.4   < > 9.6   < > 9.6 9.2 10.0  MG  --   --  2.5*   < > 2.2 2.2 2.2  PROT 7.5  --  6.6  --   --   --   --   ALBUMIN 4.3  --  3.6  --   --   --   --   AST 54*  --  42*  --   --   --   --   ALT 62*  --  59*  --   --   --   --  ALKPHOS 267*  --  214*  --   --   --   --   BILITOT 0.5  --  0.7  --   --   --   --   GFRNONAA 20*   < > 19*   < > 25* 27* 33*  ANIONGAP 9   < > 11   < > 12 8 10    < > = values in this interval not displayed.    Lipids No results for input(s): "CHOL", "TRIG", "HDL", "LABVLDL", "LDLCALC", "CHOLHDL" in the last 168 hours.  Hematology Recent Labs  Lab 09/27/22 0620 09/28/22 0043 09/29/22 0405  WBC 8.8 8.2 8.6  RBC 3.97* 3.66* 3.92*  HGB 11.3* 10.5* 11.4*  HCT 35.0* 32.6* 35.2*  MCV 88.2 89.1 89.8  MCH 28.5 28.7 29.1  MCHC 32.3 32.2 32.4  RDW 13.8 13.9 14.1  PLT 116* 119* 118*   Thyroid  Recent Labs  Lab 09/26/22 1136  TSH 3.238    BNP Recent Labs  Lab 09/27/22 0620 09/28/22 0043 09/29/22 0405  BNP 537.9* 416.3* 471.8*    DDimer No results for input(s): "DDIMER" in the last 168 hours.   Radiology    DG Chest Port 1 View  Result Date: 09/29/2022 CLINICAL DATA:  Pacemaker placement. EXAM: PORTABLE CHEST 1 VIEW COMPARISON:  09/28/2022 FINDINGS: 0523 hours. The cardio pericardial silhouette is enlarged. Bibasilar atelectasis noted with probable layering small effusions. Mild vascular congestion. No evidence for pneumothorax. Bones are diffusely demineralized. IMPRESSION: Bibasilar atelectasis with probable layering small effusions. No evidence of pneumothorax. Electronically Signed   By: Misty Stanley M.D.   On: 09/29/2022 08:12   EP PPM/ICD IMPLANT  Result Date:  09/28/2022 CONCLUSIONS:  1. Successful implantation of a Biotronik single-chamber pacemaker for symptomatic bradycardia due to complete heart block and atrial fib.  2. No early apparent complications.       Cristopher Peru, MD 09/28/2022 4:37 PM   DG Chest Port 1 View  Result Date: 09/28/2022 CLINICAL DATA:  Shortness of breath. EXAM: PORTABLE CHEST 1 VIEW COMPARISON:  09/26/2022 FINDINGS: There is been interval placement of a right IJ single lead pacer with tip of lead in the expected location of the right ventricle. Previous median sternotomy and CABG procedure. Stable cardiac enlargement. No pleural effusion or edema. No airspace opacities. IMPRESSION: 1. Stable cardiac enlargement. 2. No active lung disease. Electronically Signed   By: Kerby Moors M.D.   On: 09/28/2022 06:13    Cardiac Studies   TTE 07/2022: IMPRESSIONS     1. Left ventricular ejection fraction, by estimation, is 55 to 60%. The  left ventricle has normal function. The left ventricle has no regional  wall motion abnormalities. Left ventricular diastolic function could not  be evaluated. The average left  ventricular global longitudinal strain is -18.0 %. The global longitudinal  strain is normal.   2. Right ventricular systolic function is mildly reduced. The right  ventricular size is moderately enlarged. There is normal pulmonary artery  systolic pressure. The estimated right ventricular systolic pressure is  98.9 mmHg.   3. Left atrial size was moderately dilated.   4. Right atrial size was severely dilated.   5. The mitral valve is normal in structure. Mild mitral valve  regurgitation. No evidence of mitral stenosis.   6. Tricuspid valve regurgitation is mild to moderate.   7. The aortic valve is tricuspid. Aortic valve regurgitation is mild.   8. Aortic dilatation noted. There is severe dilatation sinus of  valsalva,  noncoronary, measuring 61 mm.   9. The inferior vena cava is dilated in size with <50%  respiratory  variability, suggesting right atrial pressure of 15 mmHg.   Comparison(s): No significant change from prior study. Prior images  reviewed side by side.   Conclusion(s)/Recommendation(s): Severe dilation at the sinus of Valsalva  (noncoronary cusp) measuring 61 mm (prior 60 mm).   Patient Profile     77 y.o. male with a hx of advanced multiple sclerosis, type A aortic dissection, right heart failure with severe TR and chronic Afib who presented with worsening AKI found to be in slow Afib with HR 20-40s for which Cardiology was consulted.  Assessment & Plan    #Profound Bradycardia: #Afib with slow ventricular response -Patient with HR 20-30s on admission in Afib with slow ventricular response; on metop 25mg  XL daily at that time -Suspect bradycardia was primary driver of worsening AKI and he is now s/p TVP placement on 1/13 -HR remain persistently in the 30s despite metop washout S/p PPM ; working well, pacing at the base rate -Apixaban has been held since 09/26/22 in anticipation for PPM; now resumed   #Chronic diastolic/Right heart failure with severe TR  - TTE 07/2022 with LVEF 78-93%, mild RV systolic dysfunction, moderate TR, RAP 15 - Symptomatically worse due to progressive multiple sclerosis. NYHA III - BNP 400s on admission; holding diuretics and farxiga due to significant bradycardia  - Noted low Bps and vertigo with farxiga, will hold - BB restarted per EP - hold losartan 100 mg daily - Deemed not to be operative candidate for TR due to progressive MS   #History of Type A Aortic dissection s/p emergent surgical repair with chronic dissection and aneurysm:  - S/p hemi-arch replacement of the ascending thoracic aorta and resuspension of the native aortic valve 2000 - Now with chronic dissection and aneurysm involving sinus of Valsalva and aortic arch -Not a candidate for surgery due to progressive multiple sclerosis -On medical management and following with  [facility]   #HTN  -  adding back BP meds. Restart just losartan for now.   #Severe multiple sclerosis - Has had progressive MS unfortunately - Follows with Neurology - Continue home meds    Time Spent Directly with Patient:  I have spent a total of 35 minutes with the patient reviewing hospital notes, telemetry, EKGs, labs and examining the patient as well as establishing an assessment and plan that was discussed personally with the patient.  > 50% of time was spent in direct patient care.   For questions or updates, please contact [place] Please consult [email or web address] for contact info under        Signed, Janina Mayo, MD  09/29/2022, 8:38 AM

## 2022-09-29 NOTE — Evaluation (Addendum)
Physical Therapy Evaluation Patient Details Name: [name] MRN: [id number] DOB: April 30, 1946 Today's Date: 09/29/2022  History of Present Illness  Pt is a 77 y.o. M who presents 09/25/22 with AKI superimposed on CKD 3B. During hospital stay he was found to have A-fib with slow ventricular response at times severe bradycardia suspicious for tachybradycardia, he was seen by cardiology underwent temporary pacemaker placement on 09/26/2022. Significant PMH: CKD, chronic diastolic heart failure, MS, paroxysmal atrial fibrillation.  Clinical Impression  PTA, pt lives with his spouse, has a caregiver 6 hours/day, and ambulates with a RW vs Rollator. Pt presents with a decline in comparison to his baseline with deficits in strength, tone, balance, and cognition. Pt requiring two person maximal assist for bed mobility and standing/lateral scooting at edge of bed. Requires max cues for maintaining pacemaker precautions and donned sling for improved adherence and compliance. Pt would benefit from AIR to address deficits, maximize functional mobility and decrease caregiver burden.     Recommendations for follow up therapy are one component of a multi-disciplinary discharge planning process, led by the attending physician.  Recommendations may be updated based on patient status, additional functional criteria and insurance authorization.  Follow Up Recommendations Acute inpatient rehab (3hours/day)      Assistance Recommended at Discharge Frequent or constant Supervision/Assistance  Patient can return home with the following  Two people to help with walking and/or transfers;Two people to help with bathing/dressing/bathroom    Equipment Recommendations Other (comment) (TBA)  Recommendations for Other Services  Rehab consult    Functional Status Assessment Patient has had a recent decline in their functional status and demonstrates the ability to make significant improvements in function in a  reasonable and predictable amount of time.     Precautions / Restrictions Precautions Precautions: Fall;Other (comment);ICD/Pacemaker Precaution Comments: BLE spasms (R>L) Required Braces or Orthoses: Sling (due to noncompliance with pacemaker precautions) Restrictions Weight Bearing Restrictions: No      Mobility  Bed Mobility Overal bed mobility: Needs Assistance Bed Mobility: Supine to Sit, Sit to Supine     Supine to sit: Max assist, +2 for physical assistance Sit to supine: Max assist, +2 for physical assistance   General bed mobility comments: MaxA + 2 for supine <> sit, cues for use of rail with RUE and minimal use of LUE due to pacemaker precautions    Transfers Overall transfer level: Needs assistance Equipment used: 2 person hand held assist Transfers: Sit to/from Stand Sit to Stand: Max assist, +2 physical assistance           General transfer comment: Pt requiring maxA + 2 for partial stand from edge of bed and multiple scoots towards HOB. Increased R lateral lean; external pressure/stretch applied to RLE due to increase tone    Ambulation/Gait                  Stairs            Wheelchair Mobility    Modified Rankin (Stroke Patients Only)       Balance Overall balance assessment: Needs assistance Sitting-balance support: Feet supported Sitting balance-Leahy Scale: Poor Sitting balance - Comments: right lateral and posterior lean, requiring minA for correction Postural control: Posterior lean, Right lateral lean Standing balance support: Bilateral upper extremity supported Standing balance-Leahy Scale: Zero                               Pertinent Vitals/Pain Pain  Assessment Pain Assessment: Faces Faces Pain Scale: Hurts a little bit    Home Living Family/patient expects to be discharged to:: Private residence Living Arrangements: Spouse/significant other Available Help at Discharge: Family;Available 24  hours/day Type of Home: House Home Access: Ramped entrance     Alternate Level Stairs-Number of Steps: stair lift Home Layout: Two level Home Equipment: Rolling Walker (2 wheels);Cane - single point;BSC/3in1;Transport chair      Prior Function Prior Level of Function : Needs assist             Mobility Comments: Pt uses a RW upstairs (has chair lift) and Rollator downstairs. Transport chair for community mobility ADLs Comments: Caregiver 3 hours AM, 3 hours PM; assists with ADL's     Hand Dominance        Extremity/Trunk Assessment   Upper Extremity Assessment Upper Extremity Assessment: Defer to OT evaluation    Lower Extremity Assessment Lower Extremity Assessment: Generalized weakness;RLE deficits/detail RLE Deficits / Details: Increased spasm sitting EOB resulting in excessive hip internal rotation, knee flexion, ankle plantarflexion    Cervical / Trunk Assessment Cervical / Trunk Assessment: Normal  Communication   Communication: No difficulties  Cognition Arousal/Alertness: Awake/alert Behavior During Therapy: Restless Overall Cognitive Status: Impaired/Different from baseline Area of Impairment: Attention, Following commands, Safety/judgement, Awareness, Problem solving                   Current Attention Level: Sustained (brief sustained attention)   Following Commands: Follows one step commands inconsistently ((due to decreased attention)) Safety/Judgement: Decreased awareness of safety, Decreased awareness of deficits Awareness: Emergent Problem Solving: Requires verbal cues General Comments: Pt talking animatedly upon entrance to family; family encouraged pt to work with therapist and stop talking, however, pt adamant to finish his story. Pt with seemingly manic like behavior, talking non stop and often off topic.        General Comments      Exercises Other Exercises Other Exercises: Supine: right hip/knee extension stretch    Assessment/Plan    PT Assessment Patient needs continued PT services  PT Problem List Decreased strength;Decreased range of motion;Decreased activity tolerance;Decreased balance;Decreased mobility;Decreased cognition;Decreased safety awareness       PT Treatment Interventions DME instruction;Gait training;Functional mobility training;Therapeutic activities;Therapeutic exercise;Balance training;Patient/family education    PT Goals (Current goals can be found in the Care Plan section)  Acute Rehab PT Goals Patient Stated Goal: did not state PT Goal Formulation: With patient Time For Goal Achievement: 10/13/22 Potential to Achieve Goals: Good    Frequency Min 3X/week     Co-evaluation PT/OT/SLP Co-Evaluation/Treatment: Yes Reason for Co-Treatment: For patient/therapist safety;To address functional/ADL transfers;Necessary to address cognition/behavior during functional activity PT goals addressed during session: Mobility/safety with mobility         AM-PAC PT "6 Clicks" Mobility  Outcome Measure Help needed turning from your back to your side while in a flat bed without using bedrails?: A Lot Help needed moving from lying on your back to sitting on the side of a flat bed without using bedrails?: A Lot Help needed moving to and from a bed to a chair (including a wheelchair)?: Total Help needed standing up from a chair using your arms (e.g., wheelchair or bedside chair)?: Total Help needed to walk in hospital room?: Total Help needed climbing 3-5 steps with a railing? : Total 6 Click Score: 8    End of Session Equipment Utilized During Treatment: Gait belt Activity Tolerance: Patient tolerated treatment well Patient left: in bed;with  call bell/phone within reach;Other (comment) (sling applied) Nurse Communication: Mobility status PT Visit Diagnosis: Other abnormalities of gait and mobility (R26.89);Muscle weakness (generalized) (M62.81)    Time: 9678-9381 PT Time  Calculation (min) (ACUTE ONLY): 30 min   Charges:   PT Evaluation $PT Eval Moderate Complexity: 1 Mod          Wyona Almas, PT, DPT Acute Rehabilitation Services Office [phone]   Deno Etienne 09/29/2022, 4:59 PM

## 2022-09-29 NOTE — Progress Notes (Signed)
PROGRESS NOTE                                                                                                                                                                                                             Patient Demographics:    [name], is a 77 y.o. male, DOB - [birth year], SLH:[id number]  Outpatient Primary MD for the patient is [name], MD    LOS - 4  Admit date - 09/25/2022    Chief Complaint  Patient presents with   Urinary Retention       Brief Narrative (HPI from H&P)   77 y.o. male with medical history significant for CKD 3B, chronic diastolic heart failure with chronic right-sided systolic heart failure, MS, paroxysmal atrial fibrillation chronically anticoagulated on Eliquis, anemia of chronic disease, who is admitted to [facility] on 09/25/2022 with acute kidney injury superimposed on CKD 3B after presenting from home to [facility] ED complaining of diminished urine output.   He was admitted for further evaluation.    During hospital stay he was found to have A-fib with slow ventricular response at times severe bradycardia suspicious for tachybradycardia, he was seen by cardiology underwent temporary pacemaker placement on 09/26/2022, he has been seen by cardiology and nephrology.  Under my care since 09/27/2022   Subjective:   Patient in bed, appears comfortable, denies any headache, no fever, no chest pain or pressure, no shortness of breath , no abdominal pain. No focal weakness.   Assessment  & Plan :   Atrial fibrillation with slow ventricular response, at times severe bradycardia.  Question tachybradycardia syndrome.  Was on metoprolol which has been held, TSH is normal, cardiology on board underwent temporary pacemaker placement on 09/26/2022 via right IJ, subsequently he was seen by EP and underwent permanent pacemaker placement on 09/28/2022 and temporary pacemaker was removed.   Device was checked by pacemaker tech on 09/29/2022 and working appropriately, advance activity initiate PT OT and move out of ICU.  Chronic diastolic heart failure EF 55% with history of underlying severe TR, history of type a aortic dissection s/p emergent surgical repair in 2000.  Currently stable, cardiology following, as needed diuretics, beta-blocker per cardiology after pacemaker placement.  MS with chronic bilateral lower extremity spasms and discomfort.  Home medication Copaxone shots per pharmacy, baclofen for supportive care.  PT OT.  Hypertension.  Blood pressure currently soft monitor off of medications.  Chronic anemia and thrombocytopenia.  Mild and stable.  Type screen done as well.  AKI on CKD 3A.  Baseline creatinine around 2.  Nephrology following.  Renal ultrasound nonacute.       Condition - Extremely Guarded  Family Communication  :  wife Neoma Laming [phone] on 09/28/2022 message left at 7:59 AM.  Code Status :  Full  Consults  :  Cards, Renal  PUD Prophylaxis :     Procedures  :     Temporary pacemaker placement via right IJ by cardiology on 09/26/2022.  Permanent pacemaker placed by EP on 09/28/2022 and temporary pacemaker removed.      Disposition Plan  :    Status is: Inpatient   DVT Prophylaxis  :    SCDs Start: 09/28/22 1524 Place and maintain sequential compression device Start: 09/27/22 0854 SCDs Start: 09/25/22 2011 apixaban (ELIQUIS) tablet 5 mg    Lab Results  Component Value Date   PLT 118 (L) 09/29/2022    Diet :  Diet Order             Diet Heart Room service appropriate? Yes; Fluid consistency: Thin  Diet effective now                    Inpatient Medications  Scheduled Meds:  [START ON 10/04/2022] apixaban  5 mg Oral BID   Chlorhexidine Gluconate Cloth  6 each Topical Daily   cholecalciferol  2,000 Units Oral Daily   folic acid  1 mg Oral Daily   [START ON 09/30/2022] Glatiramer Acetate  40 mg Subcutaneous Once per  day on Mon Wed Fri   losartan  100 mg Oral Daily   metoprolol succinate  25 mg Oral Daily   QUEtiapine  25 mg Oral QHS   Continuous Infusions:   PRN Meds:.acetaminophen **OR** acetaminophen, baclofen, melatonin, ondansetron (ZOFRAN) IV  Antibiotics  :    Anti-infectives (From admission, onward)    Start     Dose/Rate Route Frequency Ordered Stop   09/28/22 2200  ceFAZolin (ANCEF) IVPB 1 g/50 mL premix        1 g 100 mL/hr over 30 Minutes Intravenous Every 8 hours 09/28/22 1523 09/29/22 0606   09/28/22 0945  gentamicin (GARAMYCIN) 80 mg in sodium chloride 0.9 % 500 mL irrigation        80 mg Irrigation On call 09/28/22 0846 09/28/22 1443   09/28/22 0945  ceFAZolin (ANCEF) IVPB 2g/100 mL premix        2 g 200 mL/hr over 30 Minutes Intravenous On call 09/28/22 0846 09/28/22 1443         Objective:   Vitals:   09/29/22 0600 09/29/22 0700 09/29/22 0747 09/29/22 0800  BP: 135/70 130/60  (!) 117/55  Pulse: 74 78    Resp: 16 (!) 21  13  Temp:   98.2 F (36.8 C)   TempSrc:   Oral   SpO2: 99% 92%    Weight:      Height:        Wt Readings from Last 3 Encounters:  09/29/22 83.6 kg  06/20/21 81.8 kg  05/27/21 76.7 kg     Intake/Output Summary (Last 24 hours) at 09/29/2022 0929 Last data filed at 09/29/2022 0800 Gross per 24 hour  Intake 688.1 ml  Output 1050 ml  Net -361.9 ml     Physical Exam  Awake, at times mildly confused  no new F.N deficits, L.  Chest wall permanent catheter site under bandage [place].AT,PERRAL Supple Neck, No JVD,   Symmetrical Chest wall movement, Good air movement bilaterally, CTAB RRR,No Gallops,Rubs or new Murmurs,  +ve B.Sounds, Abd Soft, No tenderness,   No Cyanosis, Clubbing or edema       Data Review:    Recent Labs  Lab 09/23/22 1402 09/25/22 1223 09/26/22 0529 09/27/22 0620 09/28/22 0043 09/29/22 0405  WBC 5.3 5.2 6.4 8.8 8.2 8.6  HGB 11.5* 10.7* 11.2* 11.3* 10.5* 11.4*  HCT 36.6* 34.1* 35.7* 35.0* 32.6* 35.2*  PLT 137*  113* 119* 116* 119* 118*  MCV 90.1 90.5 90.6 88.2 89.1 89.8  MCH 28.3 28.4 28.4 28.5 28.7 29.1  MCHC 31.4 31.4 31.4 32.3 32.2 32.4  RDW 13.9 14.0 14.1 13.8 13.9 14.1  LYMPHSABS 0.5* 0.6* 0.6*  --  0.4* 0.5*  MONOABS 0.4 0.7 0.7  --  1.0 1.0  EOSABS 0.0 0.1 0.1  --  0.1 0.1  BASOSABS 0.0 0.1 0.0  --  0.0 0.0    Recent Labs  Lab 09/23/22 1402 09/25/22 1223 09/26/22 0529 09/26/22 1129 09/26/22 1136 09/27/22 0620 09/28/22 0043 09/29/22 0405  NA 135   < > 137 137  --  140 140 142  K 5.6*   < > 4.8 5.2*  --  4.7 5.0 4.6  CL 103   < > 106 105  --  110 111 111  CO2 23   < > 20* 21*  --  18* 21* 21*  ANIONGAP 9   < > 11 11  --  12 8 10   GLUCOSE 102*   < > 106* 103*  --  101* 116* 128*  BUN 82*   < > 94* 90*  --  76* 66* 57*  CREATININE 3.14*   < > 3.30* 3.27*  --  2.59* 2.43* 2.07*  AST 54*  --  42*  --   --   --   --   --   ALT 62*  --  59*  --   --   --   --   --   ALKPHOS 267*  --  214*  --   --   --   --   --   BILITOT 0.5  --  0.7  --   --   --   --   --   ALBUMIN 4.3  --  3.6  --   --   --   --   --   TSH  --   --   --   --  3.238  --   --   --   BNP 220.4*  --  418.7*  --   --  537.9* 416.3* 471.8*  MG  --   --  2.5* 2.4  --  2.2 2.2 2.2  CALCIUM 9.4   < > 9.6 9.3  --  9.6 9.2 10.0   < > = values in this interval not displayed.    Recent Labs    09/26/22 1136  TSH 3.238     Radiology Reports DG Chest Port 1 View  Result Date: 09/29/2022 CLINICAL DATA:  Pacemaker placement. EXAM: PORTABLE CHEST 1 VIEW COMPARISON:  09/28/2022 FINDINGS: 0523 hours. The cardio pericardial silhouette is enlarged. Bibasilar atelectasis noted with probable layering small effusions. Mild vascular congestion. No evidence for pneumothorax. Bones are diffusely demineralized. IMPRESSION: Bibasilar atelectasis with probable layering small effusions. No evidence of pneumothorax. Electronically Signed  By: Misty Stanley M.D.   On: 09/29/2022 08:12   EP PPM/ICD IMPLANT  Result Date:  09/28/2022 CONCLUSIONS:  1. Successful implantation of a Biotronik single-chamber pacemaker for symptomatic bradycardia due to complete heart block and atrial fib.  2. No early apparent complications.       Cristopher Peru, MD 09/28/2022 4:37 PM   DG Chest Port 1 View  Result Date: 09/28/2022 CLINICAL DATA:  Shortness of breath. EXAM: PORTABLE CHEST 1 VIEW COMPARISON:  09/26/2022 FINDINGS: There is been interval placement of a right IJ single lead pacer with tip of lead in the expected location of the right ventricle. Previous median sternotomy and CABG procedure. Stable cardiac enlargement. No pleural effusion or edema. No airspace opacities. IMPRESSION: 1. Stable cardiac enlargement. 2. No active lung disease. Electronically Signed   By: Kerby Moors M.D.   On: 09/28/2022 06:13   CARDIAC CATHETERIZATION  Result Date: 09/26/2022 Successful placement of transvenous single lead pacemaker in RV apex via RIJ access   DG Chest Port 1 View  Result Date: 09/26/2022 CLINICAL DATA:  Urinary retention EXAM: PORTABLE CHEST 1 VIEW COMPARISON:  09/23/2022 FINDINGS: Cardiac shadow is enlarged. Postsurgical changes are again seen. Lungs are well aerated bilaterally. No bony abnormality is seen. IMPRESSION: No acute abnormality noted. Electronically Signed   By: Inez Catalina M.D.   On: 09/26/2022 03:44   US Renal  Result Date: 09/25/2022 CLINICAL DATA:  Urinary retention. EXAM: RENAL / URINARY TRACT ULTRASOUND COMPLETE COMPARISON:  09/23/2022 ultrasound, 12/27/2017 CT FINDINGS: Right Kidney: Renal measurements: 9.6 x 6.0 x 6.4 centimeters = volume: 108.9 mL. Simple cyst in the mid to UPPER pole region is 5.5 centimeters. Simple cyst in the midpole region is 1.3 centimeters. No solid mass. No hydronephrosis. Renal parenchymal echogenicity is normal. Left Kidney: Renal measurements: 10.0 x 4.6 x 4.9 centimeters = volume: 123.4 mL. Multiple cysts are present, largest measuring 4.9 centimeters. No solid mass. No  hydronephrosis. Normal renal echogenicity. Bladder: Appears normal for degree of bladder distention. Other: Study quality is degraded by overlying bowel gas, patient body habitus, and limited patient positioning. IMPRESSION: Bilateral renal cysts. No hydronephrosis. Electronically Signed   By: Nolon Nations M.D.   On: 09/25/2022 13:07      Signature  -   Lala Lund M.D on 09/29/2022 at 9:29 AM   -  To page go to [email or web address]

## 2022-09-29 NOTE — Progress Notes (Addendum)
   Electrophysiology Rounding Note  Patient Name: [name] Date of Encounter: 09/29/2022  Primary Cardiologist: None Electrophysiologist: Dr. Lovena Le   Subjective   Feeling OK this am. NAEO.   Inpatient Medications    Scheduled Meds:  Chlorhexidine Gluconate Cloth  6 each Topical Daily   cholecalciferol  2,000 Units Oral Daily   folic acid  1 mg Oral Daily   [START ON 09/30/2022] Glatiramer Acetate  40 mg Subcutaneous Once per day on Mon Wed Fri   QUEtiapine  25 mg Oral QHS   Continuous Infusions:  PRN Meds: acetaminophen **OR** acetaminophen, baclofen, melatonin, ondansetron (ZOFRAN) IV   Vital Signs    Vitals:   09/29/22 0500 09/29/22 0600 09/29/22 0700 09/29/22 0747  BP: (!) 109/54 135/70 130/60   Pulse: 75 74 78   Resp: 13 16 (!) 21   Temp:    98.2 F (36.8 C)  TempSrc:    Oral  SpO2: 97% 99% 92%   Weight:      Height:        Intake/Output Summary (Last 24 hours) at 09/29/2022 0826 Last data filed at 09/29/2022 0700 Gross per 24 hour  Intake 568.1 ml  Output 1050 ml  Net -481.9 ml   Filed Weights   09/25/22 1206 09/29/22 0300  Weight: 76.7 kg 83.6 kg    Physical Exam    GEN- The patient is well appearing, alert and oriented x 3 today.   HEENT- No gross abnormality.  Lungs- Clear to ausculation bilaterally, normal work of breathing Heart- Irregularly irregular rate and rhythm, no murmurs, rubs or gallops GI- soft, NT, ND, + BS Extremities- no clubbing or cyanosis. No edema Neuro- No obvious focal abnormality.   Telemetry    AF with V pacing in 70s (personally reviewed)   Patient Profile     [name] is a 77 y.o. male with a hx of Type A Aortic dissection in 2000 s/p hemiarch replacement, severe multiple sclerosis, chronic diastolic HF and RV failure with severe TR, chronic AF on Eliquis who is being seen 09/27/2022 for the evaluation of AF with slow ventricular response at the request of Dr Johney Frame.    Pt presented to [facility]  with brady in the 20s and poor urine output, found to have marked AKI and AF with slow VR. Transferred to [facility] for TVP placement.   Assessment & Plan    AF w slow VR S/p Biotronik single chamber PPM 09/28/2022 by Dr. Lovena Le.  Reviewed wound care and arm restrictions.  Resume toprol. EF 55-60%.  Avoid AV nodal agents.  Would wait until 1/21 to resume Eliquis Usual follow up in place   2. AKI Creatinine, ser  2.07* (01/16 0405) Baseline appears ~2.0-2.3  For questions or updates, please contact [place] Please consult [email or web address] for contact info under Cardiology/STEMI.  Signed, Shirley Friar, PA-C  09/29/2022, 8:26 AM   EP Attending  Patient concerned about his neck being "strangled" by his left arm sling which I removed. His PPM is working normally and he is dependent this morning. PM interrogation under my direction demonstrates normal single chamber PM function and cxr is reassuring. He can be discharged from the EP perspective with usual followup. I would like his systemic anti-coagulation held for 4 more days.  Carleene Overlie Pepper Kerrick,MD

## 2022-09-30 ENCOUNTER — Encounter (HOSPITAL_COMMUNITY): Payer: Self-pay | Admitting: Internal Medicine

## 2022-09-30 DIAGNOSIS — N179 Acute kidney failure, unspecified: Secondary | ICD-10-CM | POA: Diagnosis not present

## 2022-09-30 DIAGNOSIS — Z950 Presence of cardiac pacemaker: Secondary | ICD-10-CM | POA: Diagnosis not present

## 2022-09-30 DIAGNOSIS — N1832 Chronic kidney disease, stage 3b: Secondary | ICD-10-CM | POA: Diagnosis not present

## 2022-09-30 DIAGNOSIS — R001 Bradycardia, unspecified: Secondary | ICD-10-CM | POA: Diagnosis not present

## 2022-09-30 LAB — CBC WITH DIFFERENTIAL/PLATELET
Abs Immature Granulocytes: 0.03 10*3/uL (ref 0.00–0.07)
Basophils Absolute: 0 10*3/uL (ref 0.0–0.1)
Basophils Relative: 1 %
Eosinophils Absolute: 0.3 10*3/uL (ref 0.0–0.5)
Eosinophils Relative: 5 %
HCT: 30.6 % — ABNORMAL LOW (ref 39.0–52.0)
Hemoglobin: 9.9 g/dL — ABNORMAL LOW (ref 13.0–17.0)
Immature Granulocytes: 1 %
Lymphocytes Relative: 7 %
Lymphs Abs: 0.5 10*3/uL — ABNORMAL LOW (ref 0.7–4.0)
MCH: 28.9 pg (ref 26.0–34.0)
MCHC: 32.4 g/dL (ref 30.0–36.0)
MCV: 89.5 fL (ref 80.0–100.0)
Monocytes Absolute: 0.8 10*3/uL (ref 0.1–1.0)
Monocytes Relative: 12 %
Neutro Abs: 4.8 10*3/uL (ref 1.7–7.7)
Neutrophils Relative %: 74 %
Platelets: 128 10*3/uL — ABNORMAL LOW (ref 150–400)
RBC: 3.42 MIL/uL — ABNORMAL LOW (ref 4.22–5.81)
RDW: 14.1 % (ref 11.5–15.5)
WBC: 6.4 10*3/uL (ref 4.0–10.5)
nRBC: 0 % (ref 0.0–0.2)

## 2022-09-30 LAB — BASIC METABOLIC PANEL
Anion gap: 8 (ref 5–15)
BUN: 58 mg/dL — ABNORMAL HIGH (ref 8–23)
CO2: 21 mmol/L — ABNORMAL LOW (ref 22–32)
Calcium: 9.1 mg/dL (ref 8.9–10.3)
Chloride: 112 mmol/L — ABNORMAL HIGH (ref 98–111)
Creatinine, Ser: 2.12 mg/dL — ABNORMAL HIGH (ref 0.61–1.24)
GFR, Estimated: 32 mL/min — ABNORMAL LOW (ref >60)
Glucose, Bld: 95 mg/dL (ref 70–99)
Potassium: 4.1 mmol/L (ref 3.5–5.1)
Sodium: 141 mmol/L (ref 135–145)

## 2022-09-30 LAB — MAGNESIUM: Magnesium: 2.2 mg/dL (ref 1.7–2.4)

## 2022-09-30 LAB — GLUCOSE, CAPILLARY: Glucose-Capillary: 86 mg/dL (ref 70–99)

## 2022-09-30 LAB — BRAIN NATRIURETIC PEPTIDE: B Natriuretic Peptide: 430.7 pg/mL — ABNORMAL HIGH (ref 0.0–100.0)

## 2022-09-30 MED ORDER — BACLOFEN 10 MG PO TABS
10.0000 mg | ORAL_TABLET | Freq: Four times a day (QID) | ORAL | Status: DC | PRN
  Administered 2022-09-30 – 2022-10-01 (×4): 10 mg via ORAL
  Filled 2022-09-30 (×6): qty 1

## 2022-09-30 MED ORDER — MELATONIN 3 MG PO TABS
3.0000 mg | ORAL_TABLET | Freq: Every day | ORAL | Status: DC
  Administered 2022-09-30 – 2022-10-01 (×2): 3 mg via ORAL
  Filled 2022-09-30 (×2): qty 1

## 2022-09-30 MED ORDER — HALOPERIDOL LACTATE 5 MG/ML IJ SOLN
5.0000 mg | Freq: Once | INTRAMUSCULAR | Status: AC | PRN
  Administered 2022-09-30: 5 mg via INTRAVENOUS
  Filled 2022-09-30: qty 1

## 2022-09-30 MED ORDER — DEXMEDETOMIDINE HCL IN NACL 400 MCG/100ML IV SOLN: 0.0000 ug/kg/h | INTRAVENOUS | Status: DC

## 2022-09-30 MED ORDER — QUETIAPINE FUMARATE 50 MG PO TABS
50.0000 mg | ORAL_TABLET | Freq: Every day | ORAL | Status: DC
  Administered 2022-09-30 – 2022-10-01 (×2): 50 mg via ORAL
  Filled 2022-09-30 (×2): qty 1

## 2022-09-30 MED ORDER — ORAL CARE MOUTH RINSE: 15.0000 mL | OROMUCOSAL | Status: DC | PRN

## 2022-09-30 MED ORDER — HALOPERIDOL LACTATE 5 MG/ML IJ SOLN: 2.0000 mg | Freq: Four times a day (QID) | INTRAMUSCULAR | Status: DC | PRN

## 2022-09-30 MED ORDER — DIAZEPAM 5 MG/ML IJ SOLN
5.0000 mg | Freq: Once | INTRAMUSCULAR | Status: AC | PRN
  Administered 2022-09-30: 5 mg via INTRAVENOUS
  Filled 2022-09-30: qty 2

## 2022-09-30 NOTE — Progress Notes (Signed)
Late entry  OT impression: Pt was evaluated s/p the above admission list, he requires assist for mobility and ADLs at baseline, he lives with his wife and has caregivers 3-6 hours per day. Upon evaluation pt had functional limitations due to impaired cognition, delirious behaviors with verbosity, generalized weakness, LE spasms with R>L, LUE precautions, poor activity tolerance, impaired balance and trunk control. Overall he required max A +2 for bed mobility with poor sitting balance, and was unable to stand despite total A +2. He requires total A for LB ADLs at bed level and up to mod A  for UB ADLs. LUE sling donned at the end of the session, as pt is unable to maintain LUE pacemaker precautions despite max cues and education. OT to continue to follow. Recommend d/c to AIR.      09/29/22 1700  OT Visit Information  Last OT Received On 09/29/22  Assistance Needed +2  PT/OT/SLP Co-Evaluation/Treatment Yes  Reason for Co-Treatment Complexity of the patient's impairments (multi-system involvement);For patient/therapist safety;To address functional/ADL transfers  OT goals addressed during session ADL's and self-care  History of Present Illness Pt is a 77 y.o. M who presents 09/25/22 with AKI superimposed on CKD 3B. During hospital stay he was found to have A-fib with slow ventricular response at times severe bradycardia suspicious for tachybradycardia, he was seen by cardiology underwent temporary pacemaker placement on 09/26/2022. Significant PMH: CKD, chronic diastolic heart failure, MS, paroxysmal atrial fibrillation.  Precautions  Precautions Fall;Other (comment);ICD/Pacemaker  Precaution Comments BLE spasms (R>L)  Required Braces or Orthoses Sling  Restrictions  Weight Bearing Restrictions No  Home Living  Family/patient expects to be discharged to: Private residence  Living Arrangements Spouse/significant other  Available Help at Discharge Family;Available 24 hours/day  Type of [place] entrance  Home Layout Two level  Alternate Level Stairs-Number of Steps stair lift  Bathroom Shower/Tub Walk-in [employer] operational [employer] (2 wheels);Cane - single point;BSC/3in1;Transport chair  Prior Function  Prior Level of Function  Needs assist  Mobility Comments Pt uses a RW upstairs (has chair lift) and Rollator downstairs. Transport chair for community mobility  ADLs Comments Caregiver 3 hours AM, 3 hours PM; assists with ADL's  Communication  Communication No difficulties  Pain Assessment  Pain Assessment Faces  Faces Pain Scale 2  Pain Location LEs with spasms  Pain Descriptors / Indicators Discomfort  Pain Intervention(s) Limited activity within patient's tolerance;Monitored during session  Cognition  Arousal/Alertness Awake/alert  Behavior During Therapy Restless  Overall Cognitive Status Impaired/Different from baseline  Area of Impairment Attention;Following commands;Safety/judgement;Awareness;Problem solving  Current Attention Level Sustained  Following Commands Follows one step commands inconsistently  Safety/Judgement Decreased awareness of safety;Decreased awareness of deficits  Awareness Emergent  Problem Solving Requires verbal cues  General Comments Pt talking animatedly upon entrance to family; family encouraged pt to work with therapist and stop talking, however, pt adamant to finish his story. Pt with seemingly manic like behavior, talking non stop and often off topic.  Upper Extremity Assessment  Upper Extremity Assessment LUE deficits/detail  LUE Deficits / Details pacemaker precautions, placed in sling. Pt moving at shoulder despite max efforts to educate and assist  Lower Extremity Assessment  Lower Extremity Assessment Defer to PT evaluation  Cervical / Trunk Assessment  Cervical / Trunk Assessment Kyphotic  Vision- History  Baseline Vision/History 1 Wears glasses  Vision- Assessment  Vision  Assessment? Vision impaired- to be further tested in functional context  Additional Comments unsure if MS has affected pt's vision, unable to assess and pt did not provide a clear answer  Perception  Perception Tested? No  Praxis  Praxis tested? Not tested  ADL  Overall ADL's  Needs assistance/impaired  Eating/Feeding Set up;Sitting  Grooming Set up;Sitting  Upper Body Bathing Moderate assistance;Sitting  Lower Body Bathing Total assistance;Bed level  Upper Body Dressing  Maximal assistance;Sitting  Lower Body Dressing Total assistance  Toilet Transfer Total assistance  Toileting- Clothing Manipulation and Hygiene Total assistance  Functional mobility during ADLs Maximal assistance;+2 for physical assistance;+2 for safety/equipment (bed level)  General ADL Comments very limited functional assessment due to verbose pt and difficult to redirect. LUE placed in sling. Transferred to EOB wtih exacerbation of spasms  Bed Mobility  Overal bed mobility Needs Assistance  Bed Mobility Supine to Sit;Sit to Supine  Supine to sit Max assist;+2 for physical assistance  Sit to supine Max assist;+2 for physical assistance  General bed mobility comments MaxA + 2 for supine <> sit, cues for use of rail with RUE and minimal use of LUE due to pacemaker precautions  Transfers  Overall transfer level Needs assistance  Equipment used 2 person hand held assist  Transfers Sit to/from Stand  Sit to Stand Max assist;+2 physical assistance  General transfer comment Pt requiring maxA + 2 for partial stand from edge of bed and multiple scoots towards HOB. Increased R lateral lean; external pressure/stretch applied to RLE due to increase tone  Balance  Overall balance assessment Needs assistance  Sitting-balance support Feet supported  Sitting balance-Leahy Scale Poor  Sitting balance - Comments right lateral and posterior lean, requiring minA for correction  Postural control Posterior lean;Right lateral lean   Standing balance support Bilateral upper extremity supported  Standing balance-Leahy Scale Zero  General Comments  General comments (skin integrity, edema, etc.) VSS. RN gave baclofen during session  OT - End of Session  Equipment Utilized During Treatment Gait belt  Activity Tolerance Patient tolerated treatment well  Patient left in bed;with call bell/phone within reach;with bed alarm set  Nurse Communication Mobility status  OT Assessment  OT Recommendation/Assessment Patient needs continued OT Services  OT Visit Diagnosis Unsteadiness on feet (R26.81);Other abnormalities of gait and mobility (R26.89);Muscle weakness (generalized) (M62.81)  OT Problem List Decreased strength;Decreased range of motion;Decreased activity tolerance;Impaired balance (sitting and/or standing);Decreased coordination;Decreased cognition;Decreased safety awareness;Decreased knowledge of use of DME or AE;Decreased knowledge of precautions;Impaired tone;Impaired UE functional use  OT Plan  OT Frequency (ACUTE ONLY) Min 2X/week  OT Treatment/Interventions (ACUTE ONLY) Self-care/ADL training;Therapeutic exercise;DME and/or AE instruction;Therapeutic activities;Balance training;Patient/family education  AM-PAC OT "6 Clicks" Daily Activity Outcome Measure (Version 2)  Help from another person eating meals? 3  Help from another person taking care of personal grooming? 3  Help from another person toileting, which includes using toliet, bedpan, or urinal? 1  Help from another person bathing (including washing, rinsing, drying)? 2  Help from another person to put on and taking off regular upper body clothing? 2  Help from another person to put on and taking off regular lower body clothing? 1  6 Click Score 12  Progressive Mobility  What is the highest level of mobility based on the progressive mobility assessment? Level 2 (Chairfast) - Balance while sitting on edge of bed and cannot stand  Activity Stood at bedside  OT  Recommendation  Recommendations for Other Services Rehab consult  Follow Up Recommendations Acute inpatient rehab (3hours/day)  Assistance recommended at discharge Frequent or  constant Supervision/Assistance  Patient can return home with the following A lot of help with walking and/or transfers;Two people to help with walking and/or transfers;A lot of help with bathing/dressing/bathroom;Two people to help with bathing/dressing/bathroom;Assistance with cooking/housework;Assistance with feeding;Direct supervision/assist for medications management;Direct supervision/assist for financial management;Assist for transportation;Help with stairs or ramp for entrance  Functional Status Assessent Patient has had a recent decline in their functional status and demonstrates the ability to make significant improvements in function in a reasonable and predictable amount of time.  OT Equipment Other (comment) (defer)  Individuals Consulted  Consulted and Agree with Results and Recommendations Patient  Acute Rehab OT Goals  Patient Stated Goal no more spasms  OT Goal Formulation With patient  Time For Goal Achievement 10/13/22  Potential to Achieve Goals Good  OT General Charges  $OT Visit 1 Visit  OT Evaluation  $OT Eval Moderate Complexity 1 Mod  Written Expression  Dominant Hand Right

## 2022-09-30 NOTE — TOC Initial Note (Signed)
Transition of Care [facility]) - Initial/Assessment Note    Patient Details  Name: [name] MRN: [id number] Date of Birth: 1946/07/20  Transition of Care [facility]) CM/SW Contact:    Erenest Rasher, RN Phone Number: [phone] 09/30/2022, 11:06 AM  Clinical Narrative:                 IP rehab is following CIR at dc. Pt lives in home with wife. Has caregiver 3 hour in am, and 3 hours in pm. Had RW and Rollator at home. Will continue to follow for dc needs.   Expected Discharge Plan: IP Rehab Facility Barriers to Discharge: Continued Medical Work up   Patient Goals and CMS Choice Patient states their goals for this hospitalization and ongoing recovery are:: wants pt to get better CMS Medicare.gov Compare Post Acute Care list provided to:: Patient Represenative (must comment) Choice offered to / list presented to : Spouse      Expected Discharge Plan and Services   Discharge Planning Services: CM Consult Post Acute Care Choice: IP Rehab Living arrangements for the past 2 months: Single Family Home                                      Prior Living Arrangements/Services Living arrangements for the past 2 months: Single Family Home Lives with:: Spouse Patient language and need for interpreter reviewed:: Yes Do you feel safe going back to the place where you live?: No   will need rehab  Need for Family Participation in Patient Care: Yes (Comment) Care giver support system in place?: Yes (comment) Current home services: DME (rolling walker, rollator, aide)    Activities of Daily Living      Permission Sought/Granted Permission sought to share information with : Case Manager, PCP, Family Supports Permission granted to share information with : Yes, Verbal Permission Granted  Share Information with NAME: [name]     Permission granted to share info w Relationship: wife  Permission granted to share info w Contact Information: [phone]  Emotional  Assessment Appearance:: Appears stated age Attitude/Demeanor/Rapport: Unable to Assess       Psych Involvement: No (comment)  Admission diagnosis:  Oliguria [R34] Bradycardia [R00.1] STEMI (ST elevation myocardial infarction) ([place]) [I21.3] Acute renal failure (ARF) ([place]) [N17.9] AKI (acute kidney injury) ([place]) [N17.9] Patient Active Problem List   Diagnosis Date Noted   Bradycardia 09/27/2022   Temporary transvenous cardiac pacemaker present 09/27/2022   Symptomatic bradycardia 09/26/2022   Paroxysmal atrial fibrillation ([place]) 09/26/2022   Essential hypertension 09/26/2022   Acute renal failure superimposed on stage 3b chronic kidney disease ([place]) 09/25/2022   Aortic root aneurysm ([place])    H/O aortic dissection 02/13/2016   Aneurysm of aortic arch ([place]) 12/31/2014   Tricuspid regurgitation 10/08/2014   RVF (right ventricular failure) ([place]) 10/08/2014   Chronic diastolic CHF (congestive heart failure) ([place]) 10/09/2013   Sinus pause 09/16/2013   Anemia due to blood loss, acute in setting of supratheraputic INR 09/11/13 09/12/2013   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 09/12/2013   Acute renal insufficiency 09/12/2013   Anasarca 09/12/2013   Anemia of chronic disease 09/12/2013   Bilateral leg edema 09/11/2013   Acute right-sided congestive heart failure ([place]) 09/11/2013   Unstable gait 09/11/2013   Dissection of aorta, thoracic- surgery Feb 2000 02/20/2013   Mitral regurgitation 02/20/2013   Lower extremity edema 02/20/2013  Volume overload 01/30/2013   Severe tricuspid valve regurgitation 01/30/2013   Pleural effusion, right, large 01/30/2013   Multiple sclerosis ([place]) 01/13/2013   Thrombocytopenia ([place]) 07/26/2011   Aortic dissection- chronic abd dissection  11/05/1998   PCP:  [name], MD Pharmacy:   [place], [place] [address] Phone: [phone] Fax:  [phone]     Social Determinants of Health (SDOH) Social History: [place]: No Food Insecurity (07/07/2018)  Housing: Medium Risk (07/07/2018)  Transportation Needs: No Transportation Needs (07/07/2018)  Financial Resource Strain: Low Risk  (07/07/2018)  Tobacco Use: Low Risk  (09/29/2022)   SDOH Interventions:     Readmission Risk Interventions     No data to display

## 2022-09-30 NOTE — Progress Notes (Signed)
PROGRESS NOTE                                                                                                                                                                                                             Patient Demographics:    [name], is a 77 y.o. male, DOB - Jun 24, 1946, QJF:[id number]  Outpatient Primary MD for the patient is [name], MD    LOS - 5  Admit date - 09/25/2022    Chief Complaint  Patient presents with   Urinary Retention       Brief Narrative (HPI from H&P)   77 y.o. male with medical history significant for CKD 3B, chronic diastolic heart failure with chronic right-sided systolic heart failure, MS, paroxysmal atrial fibrillation chronically anticoagulated on Eliquis, anemia of chronic disease, who is admitted to [facility] on 09/25/2022 with acute kidney injury superimposed on CKD 3B after presenting from home to [facility] ED complaining of diminished urine output.   He was admitted for further evaluation.    During hospital stay he was found to have A-fib with slow ventricular response at times severe bradycardia suspicious for tachybradycardia, he was seen by cardiology underwent temporary pacemaker placement on 09/26/2022, he has been seen by cardiology and nephrology.  Under my care since 09/27/2022   Subjective:   Patient in bed sleeping this morning apparently got quite agitated last night requiring some Ativan and Haldol, denies any headache or chest pain.  No cramps in the legs today.   Assessment  & Plan :   Atrial fibrillation with slow ventricular response, at times severe bradycardia.  Question tachybradycardia syndrome.  Was on metoprolol which has been held, TSH is normal, cardiology on board underwent temporary pacemaker placement on 09/26/2022 via right IJ, subsequently he was seen by EP and underwent permanent pacemaker placement on 09/28/2022 and temporary  pacemaker was removed.  Device was checked by pacemaker tech on 09/29/2022 and working appropriately, advance activity initiate PT OT and move out of ICU.  May require placement.  Chronic diastolic heart failure EF 55% with history of underlying severe TR, history of type a aortic dissection s/p emergent surgical repair in 2000.  Currently stable, cardiology following, as needed diuretics, beta-blocker per cardiology after pacemaker placement.  MS with chronic bilateral lower extremity spasms and discomfort.  Home medication Copaxone  shots per pharmacy, baclofen for supportive care.  PT OT.  Hypertension.  Blood pressure currently soft monitor off of medications.  Chronic anemia and thrombocytopenia.  Mild and stable.  Type screen done as well.  AKI on CKD 3A.  Baseline creatinine around 2.  Nephrology following.  Renal ultrasound nonacute.  Metabolic encephalopathy and hospital-acquired delirium.  Minimize narcotics and benzodiazepines, nighttime Seroquel dose and timing adjusted for better control, as needed Haldol in daytime.       Condition - Extremely Guarded  Family Communication  :  wife Neoma Laming [phone] on 09/28/2022 message left at 7:59 AM, 09/30/2022 message left at 8:42 AM  Code Status :  Full  Consults  :  Cards, Renal  PUD Prophylaxis :     Procedures  :     Temporary pacemaker placement via right IJ by cardiology on 09/26/2022.  Permanent pacemaker placed by EP on 09/28/2022 and temporary pacemaker removed.      Disposition Plan  :    Status is: Inpatient   DVT Prophylaxis  :    SCDs Start: 09/28/22 1524 Place and maintain sequential compression device Start: 09/27/22 0854 SCDs Start: 09/25/22 2011 apixaban (ELIQUIS) tablet 5 mg    Lab Results  Component Value Date   PLT 128 (L) 09/30/2022    Diet :  Diet Order             Diet Heart Room service appropriate? Yes; Fluid consistency: Thin  Diet effective now                    Inpatient  Medications  Scheduled Meds:  [START ON 10/04/2022] apixaban  5 mg Oral BID   Chlorhexidine Gluconate Cloth  6 each Topical Daily   cholecalciferol  2,000 Units Oral Daily   folic acid  1 mg Oral Daily   Glatiramer Acetate  40 mg Subcutaneous Once per day on Mon Wed Fri   metoprolol succinate  25 mg Oral Daily   QUEtiapine  25 mg Oral QHS   Continuous Infusions:   PRN Meds:.acetaminophen **OR** acetaminophen, baclofen, haloperidol lactate, melatonin, ondansetron (ZOFRAN) IV    Objective:   Vitals:   09/30/22 0400 09/30/22 0500 09/30/22 0700 09/30/22 0759  BP: 137/71 (!) 158/75 101/63   Pulse: 79 77 76   Resp: 14 (!) 22 12   Temp:    (!) 97.5 F (36.4 C)  TempSrc:    Oral  SpO2: 99% 99% 98%   Weight:  83.3 kg    Height:        Wt Readings from Last 3 Encounters:  09/30/22 83.3 kg  06/20/21 81.8 kg  05/27/21 76.7 kg     Intake/Output Summary (Last 24 hours) at 09/30/2022 0843 Last data filed at 09/30/2022 0500 Gross per 24 hour  Intake 570 ml  Output 400 ml  Net 170 ml     Physical Exam  Sleeping this morning, arousable, no new F.N deficits, L.  Chest wall permanent catheter site under bandage [place].AT,PERRAL Supple Neck, No JVD,   Symmetrical Chest wall movement, Good air movement bilaterally, CTAB RRR,No Gallops,Rubs or new Murmurs,  +ve B.Sounds, Abd Soft, No tenderness,   No Cyanosis, Clubbing or edema       Data Review:    Recent Labs  Lab 09/25/22 1223 09/26/22 0529 09/27/22 0620 09/28/22 0043 09/29/22 0405 09/30/22 0617  WBC 5.2 6.4 8.8 8.2 8.6 6.4  HGB 10.7* 11.2* 11.3* 10.5* 11.4* 9.9*  HCT 34.1* 35.7* 35.0* 32.6*  35.2* 30.6*  PLT 113* 119* 116* 119* 118* 128*  MCV 90.5 90.6 88.2 89.1 89.8 89.5  MCH 28.4 28.4 28.5 28.7 29.1 28.9  MCHC 31.4 31.4 32.3 32.2 32.4 32.4  RDW 14.0 14.1 13.8 13.9 14.1 14.1  LYMPHSABS 0.6* 0.6*  --  0.4* 0.5* 0.5*  MONOABS 0.7 0.7  --  1.0 1.0 0.8  EOSABS 0.1 0.1  --  0.1 0.1 0.3  BASOSABS 0.1 0.0  --  0.0 0.0  0.0    Recent Labs  Lab 09/23/22 1402 09/25/22 1223 09/26/22 0529 09/26/22 1129 09/26/22 1136 09/27/22 0620 09/28/22 0043 09/29/22 0405 09/30/22 0617  NA 135   < > 137 137  --  140 140 142 141  K 5.6*   < > 4.8 5.2*  --  4.7 5.0 4.6 4.1  CL 103   < > 106 105  --  110 111 111 112*  CO2 23   < > 20* 21*  --  18* 21* 21* 21*  ANIONGAP 9   < > 11 11  --  12 8 10 8   GLUCOSE 102*   < > 106* 103*  --  101* 116* 128* 95  BUN 82*   < > 94* 90*  --  76* 66* 57* 58*  CREATININE 3.14*   < > 3.30* 3.27*  --  2.59* 2.43* 2.07* 2.12*  AST 54*  --  42*  --   --   --   --   --   --   ALT 62*  --  59*  --   --   --   --   --   --   ALKPHOS 267*  --  214*  --   --   --   --   --   --   BILITOT 0.5  --  0.7  --   --   --   --   --   --   ALBUMIN 4.3  --  3.6  --   --   --   --   --   --   TSH  --   --   --   --  3.238  --   --   --   --   BNP 220.4*  --  418.7*  --   --  537.9* 416.3* 471.8* 430.7*  MG  --    < > 2.5* 2.4  --  2.2 2.2 2.2 2.2  CALCIUM 9.4   < > 9.6 9.3  --  9.6 9.2 10.0 9.1   < > = values in this interval not displayed.    No results for input(s): "TSH", "T4TOTAL", "T3FREE", "THYROIDAB" in the last 72 hours.  Invalid input(s): "FREET3"    Radiology Reports DG Chest Port 1 View  Result Date: 09/29/2022 CLINICAL DATA:  Pacemaker placement. EXAM: PORTABLE CHEST 1 VIEW COMPARISON:  09/28/2022 FINDINGS: 0523 hours. The cardio pericardial silhouette is enlarged. Bibasilar atelectasis noted with probable layering small effusions. Mild vascular congestion. No evidence for pneumothorax. Bones are diffusely demineralized. IMPRESSION: Bibasilar atelectasis with probable layering small effusions. No evidence of pneumothorax. Electronically Signed   By: Misty Stanley M.D.   On: 09/29/2022 08:12   EP PPM/ICD IMPLANT  Result Date: 09/28/2022 CONCLUSIONS:  1. Successful implantation of a Biotronik single-chamber pacemaker for symptomatic bradycardia due to complete heart block and atrial  fib.  2. No early apparent complications.       Cristopher Peru, MD 09/28/2022 4:37 PM  DG Chest Port 1 View  Result Date: 09/28/2022 CLINICAL DATA:  Shortness of breath. EXAM: PORTABLE CHEST 1 VIEW COMPARISON:  09/26/2022 FINDINGS: There is been interval placement of a right IJ single lead pacer with tip of lead in the expected location of the right ventricle. Previous median sternotomy and CABG procedure. Stable cardiac enlargement. No pleural effusion or edema. No airspace opacities. IMPRESSION: 1. Stable cardiac enlargement. 2. No active lung disease. Electronically Signed   By: Kerby Moors M.D.   On: 09/28/2022 06:13   CARDIAC CATHETERIZATION  Result Date: 09/26/2022 Successful placement of transvenous single lead pacemaker in RV apex via RIJ access      Signature  -   Lala Lund M.D on 09/30/2022 at 8:43 AM   -  To page go to [email or web address]

## 2022-09-30 NOTE — Progress Notes (Signed)
Inpatient Rehab Admissions Coordinator:   Per therapy recommendations, patient was screened for CIR candidacy by Sheila Gervasi, MS, CCC-SLP. At this time, Pt. is not yet at a level to tolerate the intensity of CIR.  Pt. may have potential to progress to becoming a potential CIR candidate, so CIR admissions team will follow and monitor for progress and participation with therapies and place consult order if Pt. appears to be an appropriate candidate. Please contact me with any questions.   Vara Mairena, MS, CCC-SLP Rehab Admissions Coordinator  [phone] (celll) [phone] (office)   

## 2022-10-01 DIAGNOSIS — N179 Acute kidney failure, unspecified: Secondary | ICD-10-CM | POA: Diagnosis not present

## 2022-10-01 DIAGNOSIS — Z950 Presence of cardiac pacemaker: Secondary | ICD-10-CM | POA: Diagnosis not present

## 2022-10-01 DIAGNOSIS — N1832 Chronic kidney disease, stage 3b: Secondary | ICD-10-CM | POA: Diagnosis not present

## 2022-10-01 DIAGNOSIS — R001 Bradycardia, unspecified: Secondary | ICD-10-CM | POA: Diagnosis not present

## 2022-10-01 LAB — CBC WITH DIFFERENTIAL/PLATELET
Abs Immature Granulocytes: 0.02 10*3/uL (ref 0.00–0.07)
Basophils Absolute: 0 10*3/uL (ref 0.0–0.1)
Basophils Relative: 1 %
Eosinophils Absolute: 0.3 10*3/uL (ref 0.0–0.5)
Eosinophils Relative: 5 %
HCT: 33.8 % — ABNORMAL LOW (ref 39.0–52.0)
Hemoglobin: 10.8 g/dL — ABNORMAL LOW (ref 13.0–17.0)
Immature Granulocytes: 0 %
Lymphocytes Relative: 8 %
Lymphs Abs: 0.5 10*3/uL — ABNORMAL LOW (ref 0.7–4.0)
MCH: 28.9 pg (ref 26.0–34.0)
MCHC: 32 g/dL (ref 30.0–36.0)
MCV: 90.4 fL (ref 80.0–100.0)
Monocytes Absolute: 0.7 10*3/uL (ref 0.1–1.0)
Monocytes Relative: 11 %
Neutro Abs: 4.8 10*3/uL (ref 1.7–7.7)
Neutrophils Relative %: 75 %
Platelets: 150 10*3/uL (ref 150–400)
RBC: 3.74 MIL/uL — ABNORMAL LOW (ref 4.22–5.81)
RDW: 14 % (ref 11.5–15.5)
WBC: 6.4 10*3/uL (ref 4.0–10.5)
nRBC: 0 % (ref 0.0–0.2)

## 2022-10-01 LAB — BRAIN NATRIURETIC PEPTIDE: B Natriuretic Peptide: 284.6 pg/mL — ABNORMAL HIGH (ref 0.0–100.0)

## 2022-10-01 LAB — BASIC METABOLIC PANEL
Anion gap: 5 (ref 5–15)
BUN: 60 mg/dL — ABNORMAL HIGH (ref 8–23)
CO2: 22 mmol/L (ref 22–32)
Calcium: 9.6 mg/dL (ref 8.9–10.3)
Chloride: 114 mmol/L — ABNORMAL HIGH (ref 98–111)
Creatinine, Ser: 2.22 mg/dL — ABNORMAL HIGH (ref 0.61–1.24)
GFR, Estimated: 30 mL/min — ABNORMAL LOW (ref >60)
Glucose, Bld: 105 mg/dL — ABNORMAL HIGH (ref 70–99)
Potassium: 4.7 mmol/L (ref 3.5–5.1)
Sodium: 141 mmol/L (ref 135–145)

## 2022-10-01 LAB — MAGNESIUM: Magnesium: 2.2 mg/dL (ref 1.7–2.4)

## 2022-10-01 MED ORDER — BACLOFEN 10 MG/20ML IT SOLN: 15.0000 mg | Freq: Once | INTRATHECAL | Status: DC

## 2022-10-01 MED ORDER — BACLOFEN 5 MG HALF TABLET
15.0000 mg | ORAL_TABLET | Freq: Once | ORAL | Status: AC
  Administered 2022-10-01: 15 mg via ORAL
  Filled 2022-10-01: qty 1

## 2022-10-01 NOTE — Progress Notes (Signed)
   No new recommendations at this time. EP notes reviewed. Pacer functioning well.  Will have follow up with device clinic.   Please let us know if we can be of further assistance.   Candee Furbish, MD

## 2022-10-01 NOTE — TOC Initial Note (Addendum)
Transition of Care [facility]) - Initial/Assessment Note    Patient Details  Name: [name] MRN: [id number] Date of Birth: Aug 28, 1946  Transition of Care [facility]) CM/SW Contact:    Erenest Rasher, RN Phone Number:  [phone] 10/01/2022, 2:45 PM  Clinical Narrative:          Provided wife with Medicare.gov list with ratings, (copy placed on chart). Wife is requesting [facility]. She does not want [facility].           HF TOC CM spoke to pt, wife and son. Spoke to wife and son about IP rehab. Wife states she wants him to go to IP rehab or SNF rehab. Explained after he completes his stay at rehab he would not have the option to go to SNF rehab if he is unable to ambulate. Wife agreeable to SNF for rehab.  Son lives out of state and is only here for a few more days. Attending updated.      Expected Discharge Plan: Skilled Nursing Facility Barriers to Discharge: Continued Medical Work up   Patient Goals and CMS Choice Patient states their goals for this hospitalization and ongoing recovery are:: wants pt to get better CMS Medicare.gov Compare Post Acute Care list provided to:: Patient Represenative (must comment) (wife) Choice offered to / list presented to : Spouse      Expected Discharge Plan and Services   Discharge Planning Services: CM Consult Post Acute Care Choice: [place] Living arrangements for the past 2 months: Single Family Home                                      Prior Living Arrangements/Services Living arrangements for the past 2 months: Single Family Home Lives with:: Spouse Patient language and need for interpreter reviewed:: Yes Do you feel safe going back to the place where you live?: No   will need rehab  Need for Family Participation in Patient Care: Yes (Comment) Care giver support system in place?: Yes (comment) Current home services: DME (rolling walker, rollator, aide)    Activities of Daily Living Home Assistive  Devices/Equipment: Brace (specify type), Cane (specify quad or straight), Walker (specify type) ADL Screening (condition at time of admission) Patient's cognitive ability adequate to safely complete daily activities?: Yes Is the patient deaf or have difficulty hearing?: No Does the patient have difficulty seeing, even when wearing glasses/contacts?: No Does the patient have difficulty concentrating, remembering, or making decisions?: Yes Patient able to express need for assistance with ADLs?: Yes Does the patient have difficulty dressing or bathing?: Yes Independently performs ADLs?: No Communication: Independent Dressing (OT): Needs assistance Is this a change from baseline?: Pre-admission baseline Grooming: Needs assistance Is this a change from baseline?: Pre-admission baseline Feeding: Independent Bathing: Needs assistance Is this a change from baseline?: Pre-admission baseline Toileting: Needs assistance Is this a change from baseline?: Pre-admission baseline In/Out Bed: Needs assistance Is this a change from baseline?: Pre-admission baseline Walks in Home: Needs assistance Is this a change from baseline?: Pre-admission baseline Does the patient have difficulty walking or climbing stairs?: Yes Weakness of Legs: Both Weakness of Arms/Hands: Both  Permission Sought/Granted Permission sought to share information with : Case Manager, PCP, Family Supports Permission granted to share information with : Yes, Verbal Permission Granted  Share Information with NAME: Brendan Gadson     Permission granted to share info w Relationship: wife  Permission granted to share info w Contact Information: [phone]  Emotional Assessment Appearance:: Appears stated age Attitude/Demeanor/Rapport: Unable to Assess       Psych Involvement: No (comment)  Admission diagnosis:  Oliguria [R34] Bradycardia [R00.1] STEMI (ST elevation myocardial infarction) ([place]) [I21.3] Acute renal failure  (ARF) ([facility]) [N17.9] AKI (acute kidney injury) ([place]) [N17.9] Patient Active Problem List   Diagnosis Date Noted   Bradycardia 09/27/2022   Temporary transvenous cardiac pacemaker present 09/27/2022   Symptomatic bradycardia 09/26/2022   Paroxysmal atrial fibrillation ([place]) 09/26/2022   Essential hypertension 09/26/2022   Acute renal failure superimposed on stage 3b chronic kidney disease ([place]) 09/25/2022   Aortic root aneurysm (H[facility])    H/O aortic dissection 02/13/2016   Aneurysm of aortic arch (HCC) 12/31/2014   Tricuspid regurgitation 10/08/2014   RVF (right ventricular failure) ([place]) 10/08/2014   Chronic diastolic CHF (congestive heart failure) ([place]) 10/09/2013   Sinus pause 09/16/2013   Anemia due to blood loss, acute in setting of supratheraputic INR 09/11/13 09/12/2013   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 09/12/2013   Acute renal insufficiency 09/12/2013   Anasarca 09/12/2013   Anemia of chronic disease 09/12/2013   Bilateral leg edema 09/11/2013   Acute right-sided congestive heart failure ([place]) 09/11/2013   Unstable gait 09/11/2013   Dissection of aorta, thoracic- surgery Feb 2000 02/20/2013   Mitral regurgitation 02/20/2013   Lower extremity edema 02/20/2013   Volume overload 01/30/2013   Severe tricuspid valve regurgitation 01/30/2013   Pleural effusion, right, large 01/30/2013   Multiple sclerosis ([place]) 01/13/2013   Thrombocytopenia ([place]) 07/26/2011   Aortic dissection- chronic abd dissection  11/05/1998   PCP:  [name], MD Pharmacy:   [place], [place] [address] Phone: [phone] Fax: [phone]     Social Determinants of Health (SDOH) Social History: SDOH Screenings   Food Insecurity: No Food Insecurity (09/30/2022)  Housing: Low Risk  (09/30/2022)  Transportation Needs: No Transportation Needs (09/30/2022)  Utilities: Not At Risk  (09/30/2022)  Financial Resource Strain: Low Risk  (07/07/2018)  Tobacco Use: Low Risk  (09/30/2022)   SDOH Interventions:     Readmission Risk Interventions     No data to display

## 2022-10-01 NOTE — Progress Notes (Addendum)
PROGRESS NOTE                                                                                                                                                                                                             Patient Demographics:    [name], is a 77 y.o. male, DOB - May 14, 1946, XBL:[id number]  Outpatient Primary MD for the patient is [name], MD    LOS - 6  Admit date - 09/25/2022    Chief Complaint  Patient presents with   Urinary Retention       Brief Narrative (HPI from H&P)   77 y.o. male with medical history significant for CKD 3B, chronic diastolic heart failure with chronic right-sided systolic heart failure, MS, paroxysmal atrial fibrillation chronically anticoagulated on Eliquis, anemia of chronic disease, who is admitted to [facility] on 09/25/2022 with acute kidney injury superimposed on CKD 3B after presenting from home to [facility] ED complaining of diminished urine output.   He was admitted for further evaluation.    During hospital stay he was found to have A-fib with slow ventricular response at times severe bradycardia suspicious for tachybradycardia, he was seen by cardiology underwent temporary pacemaker placement on 09/26/2022, he has been seen by cardiology and nephrology.  Under my care since 09/27/2022   Subjective:   Patient in bed, appears comfortable, denies any headache, no fever, no chest pain or pressure, no shortness of breath , no abdominal pain. No new focal weakness.   Assessment  & Plan :   Atrial fibrillation with slow ventricular response, at times severe bradycardia.  Question tachybradycardia syndrome.  Was on metoprolol which has been held, TSH is normal, cardiology on board underwent temporary pacemaker placement on 09/26/2022 via right IJ, subsequently he was seen by EP and underwent permanent pacemaker placement on 09/28/2022 and temporary pacemaker was removed.   Device was checked by pacemaker tech on 09/29/2022 and working appropriately, advance activity initiate PT OT and move out of ICU.  May require placement.  Chronic diastolic heart failure EF 55% with history of underlying severe TR, history of type a aortic dissection s/p emergent surgical repair in 2000.  Currently stable, cardiology following, as needed diuretics, beta-blocker per cardiology after pacemaker placement.  MS with chronic bilateral lower extremity spasms and discomfort.  Home medication Copaxone shots per pharmacy,  baclofen for supportive care.  PT OT.  Hypertension.  Blood pressure currently soft monitor off of medications.  Chronic anemia and thrombocytopenia.  Mild and stable.  Type screen done as well.  AKI on CKD 3A.  Baseline creatinine around 2.  Nephrology following.  Renal ultrasound nonacute.  Metabolic encephalopathy and hospital-acquired delirium.  Minimize narcotics and benzodiazepines, nighttime Seroquel dose and timing adjusted for better control, as needed Haldol in daytime.  Improved with that we will continue to monitor.       Condition - Extremely Guarded  Family Communication  :  wife Neoma Laming [phone] on 09/28/2022 message left at 7:59 AM, 09/30/2022 message left at 8:42 AM, and both updated over the phone on 09/30/2022, [phone] wife updated 10/01/2022.  Code Status :  Full  Consults  :  Cards, Renal  PUD Prophylaxis :     Procedures  :     Temporary pacemaker placement via right IJ by cardiology on 09/26/2022.  Permanent pacemaker placed by EP on 09/28/2022 and temporary pacemaker removed.      Disposition Plan  :    Status is: Inpatient   DVT Prophylaxis  :    SCDs Start: 09/28/22 1524 Place and maintain sequential compression device Start: 09/27/22 0854 SCDs Start: 09/25/22 2011 apixaban (ELIQUIS) tablet 5 mg    Lab Results  Component Value Date   PLT 150 10/01/2022    Diet :  Diet Order             Diet regular Room  service appropriate? Yes; Fluid consistency: Thin  Diet effective now                    Inpatient Medications  Scheduled Meds:  [START ON 10/04/2022] apixaban  5 mg Oral BID   Chlorhexidine Gluconate Cloth  6 each Topical Daily   cholecalciferol  2,000 Units Oral Daily   folic acid  1 mg Oral Daily   Glatiramer Acetate  40 mg Subcutaneous Once per day on Mon Wed Fri   melatonin  3 mg Oral QHS   metoprolol succinate  25 mg Oral Daily   QUEtiapine  50 mg Oral QHS   Continuous Infusions:   PRN Meds:.acetaminophen **OR** acetaminophen, baclofen, haloperidol lactate, ondansetron (ZOFRAN) IV, mouth rinse    Objective:   Vitals:   10/01/22 0500 10/01/22 0600 10/01/22 0700 10/01/22 0802  BP: 129/65 111/61 126/78   Pulse: 78 87 75   Resp: 10 (!) 9 11   Temp:    (!) 97.5 F (36.4 C)  TempSrc:    Oral  SpO2: 99% 99% 100%   Weight:      Height:        Wt Readings from Last 3 Encounters:  10/01/22 82 kg  06/20/21 81.8 kg  05/27/21 76.7 kg     Intake/Output Summary (Last 24 hours) at 10/01/2022 0939 Last data filed at 10/01/2022 0545 Gross per 24 hour  Intake 840 ml  Output 1100 ml  Net -260 ml     Physical Exam  Sleeping this morning, arousable, no new F.N deficits, L.  Chest wall permanent catheter site under bandage [place].AT,PERRAL Supple Neck, No JVD,   Symmetrical Chest wall movement, Good air movement bilaterally, CTAB RRR,No Gallops,Rubs or new Murmurs,  +ve B.Sounds, Abd Soft, No tenderness,   No Cyanosis, Clubbing or edema       Data Review:    Recent Labs  Lab 09/26/22 0529 09/27/22 0737 09/28/22 0043 09/29/22 0405 09/30/22 0617  10/01/22 0311  WBC 6.4 8.8 8.2 8.6 6.4 6.4  HGB 11.2* 11.3* 10.5* 11.4* 9.9* 10.8*  HCT 35.7* 35.0* 32.6* 35.2* 30.6* 33.8*  PLT 119* 116* 119* 118* 128* 150  MCV 90.6 88.2 89.1 89.8 89.5 90.4  MCH 28.4 28.5 28.7 29.1 28.9 28.9  MCHC 31.4 32.3 32.2 32.4 32.4 32.0  RDW 14.1 13.8 13.9 14.1 14.1 14.0  LYMPHSABS 0.6*   --  0.4* 0.5* 0.5* 0.5*  MONOABS 0.7  --  1.0 1.0 0.8 0.7  EOSABS 0.1  --  0.1 0.1 0.3 0.3  BASOSABS 0.0  --  0.0 0.0 0.0 0.0    Recent Labs  Lab 09/26/22 0529 09/26/22 1129 09/26/22 1136 09/27/22 0620 09/28/22 0043 09/29/22 0405 09/30/22 0617 10/01/22 0311  NA 137   < >  --  140 140 142 141 141  K 4.8   < >  --  4.7 5.0 4.6 4.1 4.7  CL 106   < >  --  110 111 111 112* 114*  CO2 20*   < >  --  18* 21* 21* 21* 22  ANIONGAP 11   < >  --  12 8 10 8 5   GLUCOSE 106*   < >  --  101* 116* 128* 95 105*  BUN 94*   < >  --  76* 66* 57* 58* 60*  CREATININE 3.30*   < >  --  2.59* 2.43* 2.07* 2.12* 2.22*  AST 42*  --   --   --   --   --   --   --   ALT 59*  --   --   --   --   --   --   --   ALKPHOS 214*  --   --   --   --   --   --   --   BILITOT 0.7  --   --   --   --   --   --   --   ALBUMIN 3.6  --   --   --   --   --   --   --   TSH  --   --  3.238  --   --   --   --   --   BNP 418.7*  --   --  537.9* 416.3* 471.8* 430.7* 284.6*  MG 2.5*   < >  --  2.2 2.2 2.2 2.2 2.2  CALCIUM 9.6   < >  --  9.6 9.2 10.0 9.1 9.6   < > = values in this interval not displayed.    No results for input(s): "TSH", "T4TOTAL", "T3FREE", "THYROIDAB" in the last 72 hours.  Invalid input(s): "FREET3"    Radiology Reports DG Chest Port 1 View  Result Date: 09/29/2022 CLINICAL DATA:  Pacemaker placement. EXAM: PORTABLE CHEST 1 VIEW COMPARISON:  09/28/2022 FINDINGS: 0523 hours. The cardio pericardial silhouette is enlarged. Bibasilar atelectasis noted with probable layering small effusions. Mild vascular congestion. No evidence for pneumothorax. Bones are diffusely demineralized. IMPRESSION: Bibasilar atelectasis with probable layering small effusions. No evidence of pneumothorax. Electronically Signed   By: Misty Stanley M.D.   On: 09/29/2022 08:12   EP PPM/ICD IMPLANT  Result Date: 09/28/2022 CONCLUSIONS:  1. Successful implantation of a Biotronik single-chamber pacemaker for symptomatic bradycardia due  to complete heart block and atrial fib.  2. No early apparent complications.       Cristopher Peru, MD 09/28/2022 4:37 PM  DG Chest Port 1 View  Result Date: 09/28/2022 CLINICAL DATA:  Shortness of breath. EXAM: PORTABLE CHEST 1 VIEW COMPARISON:  09/26/2022 FINDINGS: There is been interval placement of a right IJ single lead pacer with tip of lead in the expected location of the right ventricle. Previous median sternotomy and CABG procedure. Stable cardiac enlargement. No pleural effusion or edema. No airspace opacities. IMPRESSION: 1. Stable cardiac enlargement. 2. No active lung disease. Electronically Signed   By: Kerby Moors M.D.   On: 09/28/2022 06:13      Signature  -   Lala Lund M.D on 10/01/2022 at 9:39 AM   -  To page go to [email or web address]

## 2022-10-01 NOTE — Progress Notes (Signed)
Physical Therapy Treatment Patient Details Name: [name] MRN: [id number] DOB: July 11, 1946 Today's Date: 10/01/2022   History of Present Illness Pt is a 77 y.o. M who presents 09/25/22 with AKI superimposed on CKD 3B. During hospital stay he was found to have A-fib with slow ventricular response at times severe bradycardia suspicious for tachybradycardia, he was seen by cardiology underwent temporary pacemaker placement on 09/26/2022. Significant PMH: CKD, chronic diastolic heart failure, MS, paroxysmal atrial fibrillation.    PT Comments    Pt was able to sustain his attention longer today to follow simple commands, but he continues to display poor recall and compliance with his pacemaker precautions. L UE sling donned to prevent pt from breaking his precautions with mobility this session. Pt continues to display spasticity in his lower extremities that impact his ability to keep his feet on the floor to transfer. Unfortunately, pt was needing to be transferred to his new bed to transfer to another floor and thus did not get to coordinate this session around his baclofen. Pt did display improved initiation to assist with mobility, but still required maxA for bed mobility, to scoot laterally along EOB to transfer to other bed, and to minimally clear his buttocks with attempts to stand. As pt was able to ambulate PTA and has had a drastic functional decline with noted improvements in his cognition with willingness and motivation to participate and improve, continue to recommend AIR. Will continue to follow acutely.      Recommendations for follow up therapy are one component of a multi-disciplinary discharge planning process, led by the attending physician.  Recommendations may be updated based on patient status, additional functional criteria and insurance authorization.  Follow Up Recommendations  Acute inpatient rehab (3hours/day)     Assistance Recommended at Discharge Frequent or  constant Supervision/Assistance  Patient can return home with the following Two people to help with walking and/or transfers;Two people to help with bathing/dressing/bathroom;Direct supervision/assist for medications management;Direct supervision/assist for financial management;Assist for transportation;Help with stairs or ramp for entrance   Equipment Recommendations  Other (comment) (TBA)    Recommendations for Other Services       Precautions / Restrictions Precautions Precautions: Fall;Other (comment);ICD/Pacemaker Precaution Comments: BLE spasms (R>L) Required Braces or Orthoses: Sling (due to noncompliance with pacemaker precautions) Restrictions Other Position/Activity Restrictions: L UE pacemaker precautions     Mobility  Bed Mobility Overal bed mobility: Needs Assistance Bed Mobility: Supine to Sit     Supine to sit: Max assist, HOB elevated     General bed mobility comments: Cues to bring each leg off EOB, modA initially. MaxA to complete leg transition off EOB and pivot hips using bed pad to sit up R EOB from elevated HOB. L UE in sling to prevent breaking precautions.    Transfers Overall transfer level: Needs assistance Equipment used: 1 person hand held assist Transfers: Sit to/from Stand, Bed to chair/wheelchair/BSC Sit to Stand: Max assist          Lateral/Scoot Transfers: Max assist General transfer comment: Bil knees blocked and L UE in sling during transfers. Attempted sit to stand from EOB but pt's R leg would withdraw and lift off the floor often, therefore only clearing buttocks minimally with maxA and use of bed pad as sling under buttocks. Pt needing cues to lean his chest anteriorly to the therapist for anterior weight transition. MaxA to scoot to L along EOB until off end of bed and directly onto other bed. As pt scooted, his  L leg was able to assist more.    Ambulation/Gait               General Gait Details: unable   Stairs              Wheelchair Mobility    Modified Rankin (Stroke Patients Only)       Balance Overall balance assessment: Needs assistance Sitting-balance support: Feet supported Sitting balance-Leahy Scale: Poor Sitting balance - Comments: Pt with L lateral trunk flexion and posterior lean, min guard-minA for static sitting balance. Postural control: Posterior lean Standing balance support: Bilateral upper extremity supported Standing balance-Leahy Scale: Zero Standing balance comment: Unable to stand fully, only clearing buttocks minimally with maxA                            Cognition Arousal/Alertness: Awake/alert Behavior During Therapy: WFL for tasks assessed/performed Overall Cognitive Status: Impaired/Different from baseline Area of Impairment: Attention, Following commands, Safety/judgement, Awareness, Problem solving, Memory                   Current Attention Level: Selective Memory: Decreased short-term memory, Decreased recall of precautions Following Commands: Follows one step commands inconsistently, Follows one step commands with increased time ((due to decreased attention)) Safety/Judgement: Decreased awareness of safety, Decreased awareness of deficits Awareness: Emergent Problem Solving: Requires verbal cues, Slow processing, Difficulty sequencing, Requires tactile cues General Comments: Pt still talkative, but able to gain his attention better today. Pt will state "I can't" and need reminders to try before stating he cannot do something. Poor sequencing, needing step-by-step cues. Poor recall and compliance with pacemaker precautions.        Exercises      General Comments General comments (skin integrity, edema, etc.): pt reports he received baclofen not long before session but did not see anything except administration from the morning and RN was not available (with other pt for procedure)      Pertinent Vitals/Pain Pain Assessment Pain  Assessment: Faces Faces Pain Scale: Hurts little more Pain Location: LEs with spasms Pain Descriptors / Indicators: Discomfort, Spasm Pain Intervention(s): Limited activity within patient's tolerance, Monitored during session, Repositioned    Home Living                          Prior Function            PT Goals (current goals can now be found in the care plan section) Acute Rehab PT Goals Patient Stated Goal: to reduce his spasms PT Goal Formulation: With patient/family Time For Goal Achievement: 10/13/22 Potential to Achieve Goals: Good Progress towards PT goals: Progressing toward goals    Frequency    Min 3X/week      PT Plan Current plan remains appropriate    Co-evaluation              AM-PAC PT "6 Clicks" Mobility   Outcome Measure  Help needed turning from your back to your side while in a flat bed without using bedrails?: A Lot Help needed moving from lying on your back to sitting on the side of a flat bed without using bedrails?: A Lot Help needed moving to and from a bed to a chair (including a wheelchair)?: A Lot Help needed standing up from a chair using your arms (e.g., wheelchair or bedside chair)?: Total Help needed to walk in hospital room?: Total Help needed climbing 3-5  steps with a railing? : Total 6 Click Score: 9    End of Session Equipment Utilized During Treatment: Gait belt;Other (comment) (sling) Activity Tolerance: Patient tolerated treatment well Patient left: in bed;Other (comment);with nursing/sitter in room (sling applied; with RN and NT to take pt to new floor) Nurse Communication: Mobility status PT Visit Diagnosis: Other abnormalities of gait and mobility (R26.89);Muscle weakness (generalized) (M62.81);Unsteadiness on feet (R26.81);Difficulty in walking, not elsewhere classified (R26.2)     Time: 0722-5750 PT Time Calculation (min) (ACUTE ONLY): 38 min  Charges:  $Therapeutic Activity: 38-52 mins                      Moishe Spice, PT, DPT Acute Rehabilitation Services  Office: [phone]    Orvan Falconer 10/01/2022, 3:54 PM

## 2022-10-02 DIAGNOSIS — R001 Bradycardia, unspecified: Secondary | ICD-10-CM | POA: Diagnosis not present

## 2022-10-02 DIAGNOSIS — R34 Anuria and oliguria: Secondary | ICD-10-CM

## 2022-10-02 LAB — BASIC METABOLIC PANEL
Anion gap: 9 (ref 5–15)
BUN: 63 mg/dL — ABNORMAL HIGH (ref 8–23)
CO2: 19 mmol/L — ABNORMAL LOW (ref 22–32)
Calcium: 9.1 mg/dL (ref 8.9–10.3)
Chloride: 113 mmol/L — ABNORMAL HIGH (ref 98–111)
Creatinine, Ser: 1.88 mg/dL — ABNORMAL HIGH (ref 0.61–1.24)
GFR, Estimated: 37 mL/min — ABNORMAL LOW (ref >60)
Glucose, Bld: 95 mg/dL (ref 70–99)
Potassium: 5 mmol/L (ref 3.5–5.1)
Sodium: 141 mmol/L (ref 135–145)

## 2022-10-02 LAB — CBC WITH DIFFERENTIAL/PLATELET
Abs Immature Granulocytes: 0.02 10*3/uL (ref 0.00–0.07)
Basophils Absolute: 0 10*3/uL (ref 0.0–0.1)
Basophils Relative: 1 %
Eosinophils Absolute: 0.3 10*3/uL (ref 0.0–0.5)
Eosinophils Relative: 5 %
HCT: 30.2 % — ABNORMAL LOW (ref 39.0–52.0)
Hemoglobin: 9.7 g/dL — ABNORMAL LOW (ref 13.0–17.0)
Immature Granulocytes: 0 %
Lymphocytes Relative: 8 %
Lymphs Abs: 0.4 10*3/uL — ABNORMAL LOW (ref 0.7–4.0)
MCH: 28.9 pg (ref 26.0–34.0)
MCHC: 32.1 g/dL (ref 30.0–36.0)
MCV: 89.9 fL (ref 80.0–100.0)
Monocytes Absolute: 0.5 10*3/uL (ref 0.1–1.0)
Monocytes Relative: 10 %
Neutro Abs: 3.9 10*3/uL (ref 1.7–7.7)
Neutrophils Relative %: 76 %
Platelets: 144 10*3/uL — ABNORMAL LOW (ref 150–400)
RBC: 3.36 MIL/uL — ABNORMAL LOW (ref 4.22–5.81)
RDW: 14 % (ref 11.5–15.5)
WBC: 5.2 10*3/uL (ref 4.0–10.5)
nRBC: 0 % (ref 0.0–0.2)

## 2022-10-02 MED ORDER — SODIUM ZIRCONIUM CYCLOSILICATE 10 G PO PACK
10.0000 g | PACK | Freq: Once | ORAL | Status: AC
  Administered 2022-10-02: 10 g via ORAL
  Filled 2022-10-02: qty 1

## 2022-10-02 MED ORDER — ACETAMINOPHEN 325 MG PO TABS: 650.0000 mg | ORAL_TABLET | Freq: Four times a day (QID) | ORAL | Status: DC | PRN

## 2022-10-02 MED ORDER — BACLOFEN 10 MG PO TABS: 5.0000 mg | ORAL_TABLET | Freq: Three times a day (TID) | ORAL | 0 refills | Status: DC | PRN

## 2022-10-02 MED ORDER — FUROSEMIDE 40 MG PO TABS: 40.0000 mg | ORAL_TABLET | Freq: Every day | ORAL | 0 refills | Status: DC | PRN

## 2022-10-02 NOTE — TOC CM/SW Note (Signed)
Patient meds were returned to him from pharmacy. Pt's wife will have to supply Copaxone for next week injections. She is scheduled to meet with admissions on Monday. Pt had Eliquis 16 pills to take to facility. Updated Unit RN   Call report to # [phone] room #610 B    Jonnie Finner RN3 CCM, Heart Failure TOC CM [phone]

## 2022-10-02 NOTE — Plan of Care (Signed)
  Problem: Nutrition: Goal: Adequate nutrition will be maintained Outcome: Completed/Met

## 2022-10-02 NOTE — Progress Notes (Signed)
Inpatient Rehab Admissions Coordinator:   Per therapy recommendations, patient was screened for CIR candidacy by Clemens Catholic, MS, CCC-SLP . At this time, Pt. is not at a level to tolerate the intensity of CIR. Recommend look at other rehab venues. Please contact me with any questions.   Clemens Catholic, [place], [place] Admissions Coordinator  [phone] ([place]) [phone] (office)

## 2022-10-02 NOTE — NC FL2 (Signed)
[place] LEVEL OF CARE FORM     IDENTIFICATION  Patient Name: [name] Birthdate: [birth year] Sex: male Admission Date (Current Location): 09/25/2022  [facility] and [place] Number:  Herbalist and Address:  [facility], [place] [address], [place],  [address]      Provider Number:    Attending Physician Name and Address:  Thurnell Lose, MD  Relative Name and Phone Number:  Shahzaib Azevedo #  [phone]    Current Level of Care: Hospital Recommended Level of Care: [place] Prior Approval Number:    Date Approved/Denied:   PASRR Number: [id number]  Discharge Plan: SNF    Current Diagnoses: Patient Active Problem List   Diagnosis Date Noted   Bradycardia 09/27/2022   Temporary transvenous cardiac pacemaker present 09/27/2022   Symptomatic bradycardia 09/26/2022   Paroxysmal atrial fibrillation ([place]) 09/26/2022   Essential hypertension 09/26/2022   Acute renal failure superimposed on stage 3b chronic kidney disease ([place]) 09/25/2022   Aortic root aneurysm ([place])    H/O aortic dissection 02/13/2016   Aneurysm of aortic arch ([place]) 12/31/2014   Tricuspid regurgitation 10/08/2014   RVF (right ventricular failure) ([place]) 10/08/2014   Chronic diastolic CHF (congestive heart failure) ([place]) 10/09/2013   Sinus pause 09/16/2013   Anemia due to blood loss, acute in setting of supratheraputic INR 09/11/13 09/12/2013   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 09/12/2013   Acute renal insufficiency 09/12/2013   Anasarca 09/12/2013   Anemia of chronic disease 09/12/2013   Bilateral leg edema 09/11/2013   Acute right-sided congestive heart failure ([place]) 09/11/2013   Unstable gait 09/11/2013   Dissection of aorta, thoracic- surgery Feb 2000 02/20/2013   Mitral regurgitation 02/20/2013   Lower extremity edema 02/20/2013   Volume overload 01/30/2013   Severe tricuspid valve regurgitation  01/30/2013   Pleural effusion, right, large 01/30/2013   Multiple sclerosis ([place]) 01/13/2013   Thrombocytopenia ([place]) 07/26/2011   Aortic dissection- chronic abd dissection  11/05/1998    Orientation RESPIRATION BLADDER Height & Weight     Self, Time, Situation, Place  Normal Incontinent Weight: 85 kg Height:  5\' 8"  (172.7 cm)  BEHAVIORAL SYMPTOMS/MOOD NEUROLOGICAL BOWEL NUTRITION STATUS      Continent Diet (Regular Diet)  AMBULATORY STATUS COMMUNICATION OF NEEDS Skin   Extensive Assist Verbally Normal (Pacemaker incision -no issues)                       Personal Care Assistance Level of Assistance  Bathing, Feeding, Dressing Bathing Assistance: Limited assistance Feeding assistance: Independent Dressing Assistance: Maximum assistance     Functional Limitations Info  Sight, Hearing, Speech Sight Info: Impaired (wears glasses) Hearing Info: Adequate Speech Info: Adequate    SPECIAL CARE FACTORS FREQUENCY  PT (By licensed PT), OT (By licensed OT)     PT Frequency: 3x per week OT Frequency: 3x per week            Contractures Contractures Info: Not present    Additional Factors Info  Code Status, Allergies Code Status Info: Full Code Allergies Info: Farxiga (Dapagliflozin)           Current Medications (10/02/2022):  This is the current hospital active medication list Current Facility-Administered Medications  Medication Dose Route Frequency Provider Last Rate Last Admin   acetaminophen (TYLENOL) tablet 650 mg  650 mg Oral Q6H PRN Hosie Poisson, MD   650 mg at 10/01/22 2244   Or  acetaminophen (TYLENOL) suppository 650 mg  650 mg Rectal Q6H PRN Hosie Poisson, MD       [START ON 10/04/2022] apixaban (ELIQUIS) tablet 5 mg  5 mg Oral BID Shirley Friar, PA-C       baclofen (LIORESAL) tablet 10 mg  10 mg Oral Q6H PRN Thurnell Lose, MD   10 mg at 10/01/22 2245   Chlorhexidine Gluconate Cloth 2 % PADS 6 each  6 each Topical Daily [name],  MD   6 each at 10/01/22 1026   cholecalciferol (VITAMIN D3) 25 MCG (1000 UNIT) tablet 2,000 Units  2,000 Units Oral Daily Thurnell Lose, MD   2,000 Units at 97/35/32 9924   folic acid (FOLVITE) tablet 1 mg  1 mg Oral Daily Thurnell Lose, MD   1 mg at 10/02/22 0809   Glatiramer Acetate SOSY 40mg  - Patient Supply  40 mg Subcutaneous Once per day on Mon Wed Fri Einar Grad, RPH   40 mg at 09/30/22 0913   haloperidol lactate (HALDOL) injection 2 mg  2 mg Intramuscular Q6H PRN Thurnell Lose, MD       melatonin tablet 3 mg  3 mg Oral QHS Thurnell Lose, MD   3 mg at 10/01/22 2244   metoprolol succinate (TOPROL-XL) 24 hr tablet 25 mg  25 mg Oral Daily Shirley Friar, PA-C   25 mg at 10/02/22 0809   ondansetron (ZOFRAN) injection 4 mg  4 mg Intravenous Q6H PRN Evans Lance, MD       Oral care mouth rinse  15 mL Mouth Rinse PRN Thurnell Lose, MD       QUEtiapine (SEROQUEL) tablet 50 mg  50 mg Oral QHS Thurnell Lose, MD   50 mg at 10/01/22 2244     Discharge Medications: Please see discharge summary for a list of discharge medications.  Relevant Imaging Results:  Relevant Lab Results:   Additional Information SS# [id number]  IWLNLG, Rexene Alberts, RN

## 2022-10-02 NOTE — Progress Notes (Signed)
Occupational Therapy Treatment Patient Details Name: [name] MRN: [id number] DOB: 07/24/1946 Today's Date: 10/02/2022   History of present illness Pt is a 77 y.o. M who presents 09/25/22 with AKI superimposed on CKD 3B. During hospital stay he was found to have A-fib with slow ventricular response at times severe bradycardia suspicious for tachybradycardia, he was seen by cardiology underwent temporary pacemaker placement on 09/26/2022. Significant PMH: CKD, chronic diastolic heart failure, MS, paroxysmal atrial fibrillation.   OT comments  Pt. Seen for skilled OT treatment session.  Max A for UB dressing, max a for LB dressing.  Difficult to maintain attention to tasks secondary to self distraction through conversation and comments from pt. With noted difficulty with divided attention.  Some improvement with muting the television during dressing tasks.  Note d/c snf likely for cont. Rehab.  Will cont. With current acute poc while here.      Recommendations for follow up therapy are one component of a multi-disciplinary discharge planning process, led by the attending physician.  Recommendations may be updated based on patient status, additional functional criteria and insurance authorization.    Follow Up Recommendations  Acute inpatient rehab (3hours/day)     Assistance Recommended at Discharge Frequent or constant Supervision/Assistance  Patient can return home with the following  A lot of help with walking and/or transfers;Two people to help with walking and/or transfers;A lot of help with bathing/dressing/bathroom;Two people to help with bathing/dressing/bathroom;Assistance with cooking/housework;Assistance with feeding;Direct supervision/assist for medications management;Direct supervision/assist for financial management;Assist for transportation;Help with stairs or ramp for entrance   Equipment Recommendations       Recommendations for Other Services Rehab consult     Precautions / Restrictions Precautions Precautions: Fall;Other (comment);ICD/Pacemaker Precaution Comments: BLE spasms (R>L) Required Braces or Orthoses: Sling Restrictions LUE Weight Bearing: Non weight bearing Other Position/Activity Restrictions: L UE pacemaker precautions       Mobility Bed Mobility                    Transfers                         Balance                                           ADL either performed or assessed with clinical judgement   ADL Overall ADL's : Needs assistance/impaired                 Upper Body Dressing : Maximal assistance;Sitting;Bed level Upper Body Dressing Details (indicate cue type and reason): hob elevated in seated position, assistance for donning and organizing gown and sling for LUE Lower Body Dressing: Maximal assistance;Bed level Lower Body Dressing Details (indicate cue type and reason): pt. in long sitting, able to reach L foot to don sock over toes, unable to pull over foot and heel, delined attempting R foot "thats my bad leg"               General ADL Comments: max redirection and cues for initiation and completion of tasks    Extremity/Trunk Assessment              Vision       Perception     Praxis      Cognition Arousal/Alertness: Awake/alert Behavior During Therapy: WFL for tasks assessed/performed Overall Cognitive Status: Impaired/Different from  baseline Area of Impairment: Attention, Following commands, Safety/judgement, Awareness, Problem solving, Memory                   Current Attention Level: Selective Memory: Decreased short-term memory, Decreased recall of precautions Following Commands: Follows one step commands inconsistently, Follows one step commands with increased time Safety/Judgement: Decreased awareness of safety, Decreased awareness of deficits Awareness: Emergent Problem Solving: Requires verbal cues, Slow processing,  Difficulty sequencing, Requires tactile cues General Comments: able to redirect but not consistently        Exercises      Shoulder Instructions       General Comments      Pertinent Vitals/ Pain       Pain Assessment Pain Assessment: No/denies pain  Home Living                                          Prior Functioning/Environment              Frequency  Min 2X/week        Progress Toward Goals  OT Goals(current goals can now be found in the care plan section)  Progress towards OT goals: Progressing toward goals     Plan      Co-evaluation                 AM-PAC OT "6 Clicks" Daily Activity     Outcome Measure   Help from another person eating meals?: A Little Help from another person taking care of personal grooming?: A Little Help from another person toileting, which includes using toliet, bedpan, or urinal?: Total Help from another person bathing (including washing, rinsing, drying)?: A Lot Help from another person to put on and taking off regular upper body clothing?: A Lot Help from another person to put on and taking off regular lower body clothing?: Total 6 Click Score: 12    End of Session    OT Visit Diagnosis: Unsteadiness on feet (R26.81);Other abnormalities of gait and mobility (R26.89);Muscle weakness (generalized) (M62.81)   Activity Tolerance Patient tolerated treatment well   Patient Left in bed;with call bell/phone within reach;with bed alarm set   Nurse Communication          Time: 1035-1050 OT Time Calculation (min): 15 min  Charges: OT General Charges $OT Visit: 1 Visit OT Treatments $Self Care/Home Management : 8-22 mins  [name], COTA/L Acute Rehabilitation [phone]   [name] 10/02/2022, 11:02 AM

## 2022-10-02 NOTE — TOC CM/SW Note (Signed)
CM contacted pt's wife and son with current bed offers for [facility], [facility], and [facility]. Contacted [facility] rep, [place] and left message. [place], Heart Failure TOC CM [phone]

## 2022-10-02 NOTE — Discharge Summary (Addendum)
[name] KKX:[id number] DOB: 06-19-1946 DOA: 09/25/2022  PCP: [name], MD  Admit date: 09/25/2022  Discharge date: 10/02/2022  Admitted From: Home   Disposition:  SNF   Recommendations for Outpatient Follow-up:   Follow up with PCP in 1-2 weeks  PCP Please obtain BMP/CBC, 2 view CXR in 1week,  (see Discharge instructions)   PCP Please follow up on the following pending results:    Home Health: None   Equipment/Devices: None  Consultations: Cards Discharge Condition: Stable    CODE STATUS: Full    Diet Recommendation: Heart Healthy     Chief Complaint  Patient presents with   Urinary Retention     Brief history of present illness from the day of admission and additional interim summary    77 y.o. male with medical history significant for CKD 3B, chronic diastolic heart failure with chronic right-sided systolic heart failure, MS, paroxysmal atrial fibrillation chronically anticoagulated on Eliquis, anemia of chronic disease, who is admitted to [facility] on 09/25/2022 with acute kidney injury superimposed on CKD 3B after presenting from home to [facility] ED complaining of diminished urine output.   He was admitted for further evaluation.     During hospital stay he was found to have A-fib with slow ventricular response at times severe bradycardia suspicious for tachybradycardia, he was seen by cardiology underwent temporary pacemaker placement on 09/26/2022, he has been seen by cardiology and nephrology.  Under my care since 09/27/2022                                                                 Hospital Course   Atrial fibrillation with slow ventricular response, at times severe bradycardia.  Question tachybradycardia syndrome.  Was on metoprolol which has been held, TSH is normal,  cardiology on board underwent temporary pacemaker placement on 09/26/2022 via right IJ, subsequently he was seen by EP and underwent permanent pacemaker placement on 09/28/2022 and temporary pacemaker was removed.  Device was checked by pacemaker tech on 09/29/2022 and working appropriately, advance activity initiate PT OT and move out of ICU.  He will require SNF.   Chronic diastolic heart failure EF 55% with history of underlying severe TR, history of type a aortic dissection s/p emergent surgical repair in 2000.  Currently stable, cardiology following, home dose diuretics, beta-blocker per cardiology after pacemaker placement resumed.   MS with chronic bilateral lower extremity spasms and discomfort.  Home medication Copaxone shots per pharmacy, baclofen for supportive care.  PT OT >> SNF.   Hypertension.  Blood pressure stable on Toprol, continue to monitor and adjust at SNF.  Diuretic dose adjusted for recent renal failure to as needed.  ARB held.   Chronic anemia and thrombocytopenia.  Mild and stable.     AKI on CKD 3A.  Baseline creatinine around 2.  Nephrology saw the patient here.  Renal ultrasound non acute, at baseline now.   Metabolic encephalopathy and hospital-acquired delirium.  Minimize narcotics and benzodiazepines, nighttime Seroquel continued, stable now.    Discharge diagnosis     Principal Problem:   Acute renal failure superimposed on stage 3b chronic kidney disease (HCC) Active Problems:   Multiple sclerosis (HCC)   Anemia of chronic disease   Chronic diastolic CHF (congestive heart failure) (HCC)   Symptomatic bradycardia   Paroxysmal atrial fibrillation (HCC)   Essential hypertension   Bradycardia   Temporary transvenous cardiac pacemaker present    Discharge instructions    Discharge Instructions     Diet - low sodium heart healthy   Complete by: As directed    Discharge instructions   Complete by: As directed    Please follow the post pacemaker  instructions provided to you by the cardiologist.    Follow with Primary MD [name], MD in 7 days   Get CBC, CMP, Magnesium, 2 view Chest X ray -  checked next visit with your primary MD or SNF MD    Activity: As tolerated with Full fall precautions use walker/cane & assistance as needed  Disposition SNF  Diet: Heart Healthy    Special Instructions: If you have smoked or chewed Tobacco  in the last 2 yrs please stop smoking, stop any regular Alcohol  and or any Recreational drug use.  On your next visit with your primary care physician please Get Medicines reviewed and adjusted.  Please request your Prim.MD to go over all Hospital Tests and Procedure/Radiological results at the follow up, please get all Hospital records sent to your Prim MD by signing hospital release before you go home.  If you experience worsening of your admission symptoms, develop shortness of breath, life threatening emergency, suicidal or homicidal thoughts you must seek medical attention immediately by calling 911 or calling your MD immediately  if symptoms less severe.  You Must read complete instructions/literature along with all the possible adverse reactions/side effects for all the Medicines you take and that have been prescribed to you. Take any new Medicines after you have completely understood and accpet all the possible adverse reactions/side effects.       Discharge Medications   Allergies as of 10/02/2022       Reactions   Farxiga [dapagliflozin] Other (See Comments)   B/P dropped too low and extreme vertigo        Medication List     STOP taking these medications    dapagliflozin propanediol 10 MG Tabs tablet Commonly known as: Farxiga   losartan 100 MG tablet Commonly known as: COZAAR       TAKE these medications    acetaminophen 325 MG tablet Commonly known as: TYLENOL Take 2 tablets (650 mg total) by mouth every 6 (six) hours as needed for mild pain (or Fever >/=  101).   baclofen 10 MG tablet Commonly known as: LIORESAL Take 0.5 tablets (5 mg total) by mouth 3 (three) times daily as needed for muscle spasms. 2nd prn What changed:  when to take this reasons to take this   Copaxone 40 MG/ML Sosy Generic drug: Glatiramer Acetate INJECT ONE SYRINGE SUBCUTANEOUSLY 3 TIMES A WEEK AT LEAST 48 HOURS APART. ALLOW TO WARM TO ROOM TEMP FOR 20 MINUTES. REFRIGERATE. What changed:  how much to take how to take this when to take this additional instructions Another medication with the same name was  removed. Continue taking this medication, and follow the directions you see here.   dalfampridine 10 MG Tb12 TAKE 1 TABLET BY MOUTH 2 TIMES A DAY (APPROXIMATELY 12 HOURS APART). MAY BE TAKEN WITH OR WITHOUT FOOD. DO NOT CUT OR CRUSH. STORE AT ROOM T What changed:  how much to take how to take this when to take this additional instructions   Eliquis 5 MG Tabs tablet Generic drug: apixaban Take 1 tablet (5 mg total) by mouth 2 (two) times daily.   folic acid 1 MG tablet Commonly known as: FOLVITE Take 1 mg by mouth daily.   furosemide 40 MG tablet Commonly known as: LASIX Take 1 tablet (40 mg total) by mouth daily as needed for edema or fluid. What changed:  See the new instructions. Another medication with the same name was removed. Continue taking this medication, and follow the directions you see here.   metoprolol succinate 25 MG 24 hr tablet Commonly known as: TOPROL-XL Take 25 mg by mouth daily.   Multivitamin Men Tabs Take 1 tablet by mouth daily.   QUEtiapine 25 MG tablet Commonly known as: SEROQUEL Take 25-50 mg by mouth at bedtime.   Vitamin D3 50 MCG (2000 UT) Tabs Take 2,000 Units by mouth daily.         Follow-up Information     [name], MD. Schedule an appointment as soon as possible for a visit in 1 week(s).   Specialty: Internal Medicine Contact information: [address] [place]  [address] [phone]         Evans Lance, MD. Schedule an appointment as soon as possible for a visit in 1 week(s).   Specialty: Cardiology Contact information: [address] [place] [address] [place] [address] [phone]                 Major procedures and Radiology Reports - PLEASE review detailed and final reports thoroughly  -       DG Chest Port 1 View  Result Date: 09/29/2022 CLINICAL DATA:  Pacemaker placement. EXAM: PORTABLE CHEST 1 VIEW COMPARISON:  09/28/2022 FINDINGS: 0523 hours. The cardio pericardial silhouette is enlarged. Bibasilar atelectasis noted with probable layering small effusions. Mild vascular congestion. No evidence for pneumothorax. Bones are diffusely demineralized. IMPRESSION: Bibasilar atelectasis with probable layering small effusions. No evidence of pneumothorax. Electronically Signed   By: Misty Stanley M.D.   On: 09/29/2022 08:12   EP PPM/ICD IMPLANT  Result Date: 09/28/2022 CONCLUSIONS:  1. Successful implantation of a Biotronik single-chamber pacemaker for symptomatic bradycardia due to complete heart block and atrial fib.  2. No early apparent complications.       Cristopher Peru, MD 09/28/2022 4:37 PM   DG Chest Port 1 View  Result Date: 09/28/2022 CLINICAL DATA:  Shortness of breath. EXAM: PORTABLE CHEST 1 VIEW COMPARISON:  09/26/2022 FINDINGS: There is been interval placement of a right IJ single lead pacer with tip of lead in the expected location of the right ventricle. Previous median sternotomy and CABG procedure. Stable cardiac enlargement. No pleural effusion or edema. No airspace opacities. IMPRESSION: 1. Stable cardiac enlargement. 2. No active lung disease. Electronically Signed   By: Kerby Moors M.D.   On: 09/28/2022 06:13   CARDIAC CATHETERIZATION  Result Date: 09/26/2022 Successful placement of transvenous single lead pacemaker in RV apex via RIJ access   DG Chest Port 1 View  Result Date: 09/26/2022 CLINICAL  DATA:  Urinary retention EXAM: PORTABLE CHEST 1 VIEW  COMPARISON:  09/23/2022 FINDINGS: Cardiac shadow is enlarged. Postsurgical changes are again seen. Lungs are well aerated bilaterally. No bony abnormality is seen. IMPRESSION: No acute abnormality noted. Electronically Signed   By: Inez Catalina M.D.   On: 09/26/2022 03:44   US Renal  Result Date: 09/25/2022 CLINICAL DATA:  Urinary retention. EXAM: RENAL / URINARY TRACT ULTRASOUND COMPLETE COMPARISON:  09/23/2022 ultrasound, 12/27/2017 CT FINDINGS: Right Kidney: Renal measurements: 9.6 x 6.0 x 6.4 centimeters = volume: 108.9 mL. Simple cyst in the mid to UPPER pole region is 5.5 centimeters. Simple cyst in the midpole region is 1.3 centimeters. No solid mass. No hydronephrosis. Renal parenchymal echogenicity is normal. Left Kidney: Renal measurements: 10.0 x 4.6 x 4.9 centimeters = volume: 123.4 mL. Multiple cysts are present, largest measuring 4.9 centimeters. No solid mass. No hydronephrosis. Normal renal echogenicity. Bladder: Appears normal for degree of bladder distention. Other: Study quality is degraded by overlying bowel gas, patient body habitus, and limited patient positioning. IMPRESSION: Bilateral renal cysts. No hydronephrosis. Electronically Signed   By: Nolon Nations M.D.   On: 09/25/2022 13:07   US Renal  Result Date: 09/23/2022 CLINICAL DATA:  Acute kidney insufficiency EXAM: RENAL / URINARY TRACT ULTRASOUND COMPLETE COMPARISON:  09/03/2021 renal ultrasound. Older CT scans 12/27/2017 and older FINDINGS: Right Kidney: Renal measurements: 9.9 x 6.9 x 5.5 = volume: 198.7 mL. Mild global atrophy and thinning of the parenchyma. There is a exophytic midportion anechoic structure measuring 4.5 x 3.6 x 4.5 cm consistent with a benign cyst. This was seen previously. No significant collecting system dilatation or perinephric fluid. Left Kidney: Renal measurements: 12.5 x 4.8 x 4.7 = volume: 146.8 mL. Mild global parenchymal atrophy. No collecting  system dilatation. Simple appearing cysts also seen in the left kidney measuring up to 4.9 x 4.9 x 4.0 cm and a second focus measuring 4.0 x 3.9 x 3.6 cm. This was seen previously. Bladder: Trabeculated thickened wall of the bladder. Bladder is mildly distended. Other: Limited visualization of the aorta and IVC. IMPRESSION: Mild renal atrophy. No collecting system dilatation. Bilateral renal cysts which appear simple. Electronically Signed   By: Jill Side M.D.   On: 09/23/2022 18:18   DG Chest 2 View  Result Date: 09/23/2022 CLINICAL DATA:  Weight gain. Congestive heart failure. Shortness of breath. End-stage renal failure. EXAM: CHEST - 2 VIEW COMPARISON:  AP chest 03/01/2020 FINDINGS: Status post median sternotomy. Cardiac silhouette is again mildly to moderately enlarged. Mediastinal contours are within normal limits. The lungs are clear. No pleural effusion or pneumothorax. Mild lateral lower lung horizontal linear subsegmental atelectasis versus scarring. No pleural effusion or pneumothorax. Mild multilevel degenerative disc changes of the thoracic spine. IMPRESSION: 1. No acute lung process. 2. Mild to moderate cardiomegaly. Electronically Signed   By: Yvonne Kendall M.D.   On: 09/23/2022 12:36    Micro Results    Recent Results (from the past 240 hour(s))  MRSA Next Gen by PCR, Nasal     Status: None   Collection Time: 09/26/22 11:29 AM   Specimen: Nasal Mucosa; Nasal Swab  Result Value Ref Range Status   MRSA by PCR Next Gen NOT DETECTED NOT DETECTED Final    Comment: (NOTE) The GeneXpert MRSA Assay (FDA approved for NASAL specimens only), is one component of a comprehensive MRSA colonization surveillance program. It is not intended to diagnose MRSA infection nor to guide or monitor treatment for MRSA infections. Test performance is not FDA approved in patients less than 2 years  old. Performed at [facility], [place] [address] [place], [place] [address]   Surgical PCR screen      Status: None   Collection Time: 09/28/22  1:08 PM   Specimen: Nasal Mucosa; Nasal Swab  Result Value Ref Range Status   MRSA, PCR NEGATIVE NEGATIVE Final   Staphylococcus aureus NEGATIVE NEGATIVE Final    Comment: (NOTE) The Xpert SA Assay (FDA approved for NASAL specimens in patients 53 years of age and older), is one component of a comprehensive surveillance program. It is not intended to diagnose infection nor to guide or monitor treatment. Performed at [facility], [place] [address] [place], [place] [address]     Today   Subjective    [name] today has no headache,no chest abdominal pain,no new weakness tingling or numbness, feels much better wants to go home today.    Objective   Blood pressure 138/90, pulse 89, temperature (!) 97.5 F (36.4 C), temperature source Oral, resp. rate 15, height 5\' 8"  (1.727 m), weight 85 kg, SpO2 96 %.   Intake/Output Summary (Last 24 hours) at 10/02/2022 0727 Last data filed at 10/02/2022 0200 Gross per 24 hour  Intake 360 ml  Output 600 ml  Net -240 ml    Exam  Awake Alert, No new F.N deficits,  L arm in sling [place].AT,PERRAL Supple Neck,   Symmetrical Chest wall movement, Good air movement bilaterally, CTAB RRR,No Gallops,   +ve B.Sounds, Abd Soft, Non tender,  No Cyanosis, Clubbing or edema    Data Review   Recent Labs  Lab 09/28/22 0043 09/29/22 0405 09/30/22 0617 10/01/22 0311 10/02/22 0127  WBC 8.2 8.6 6.4 6.4 5.2  HGB 10.5* 11.4* 9.9* 10.8* 9.7*  HCT 32.6* 35.2* 30.6* 33.8* 30.2*  PLT 119* 118* 128* 150 144*  MCV 89.1 89.8 89.5 90.4 89.9  MCH 28.7 29.1 28.9 28.9 28.9  MCHC 32.2 32.4 32.4 32.0 32.1  RDW 13.9 14.1 14.1 14.0 14.0  LYMPHSABS 0.4* 0.5* 0.5* 0.5* 0.4*  MONOABS 1.0 1.0 0.8 0.7 0.5  EOSABS 0.1 0.1 0.3 0.3 0.3  BASOSABS 0.0 0.0 0.0 0.0 0.0    Recent Labs  Lab 09/26/22 0529 09/26/22 1129 09/26/22 1136 09/27/22 0620 09/28/22 0043 09/29/22 0405 09/30/22 0617 10/01/22 0311  10/02/22 0127  NA 137   < >  --  140 140 142 141 141 141  K 4.8   < >  --  4.7 5.0 4.6 4.1 4.7 5.0  CL 106   < >  --  110 111 111 112* 114* 113*  CO2 20*   < >  --  18* 21* 21* 21* 22 19*  ANIONGAP 11   < >  --  12 8 10 8 5 9   GLUCOSE 106*   < >  --  101* 116* 128* 95 105* 95  BUN 94*   < >  --  76* 66* 57* 58* 60* 63*  CREATININE 3.30*   < >  --  2.59* 2.43* 2.07* 2.12* 2.22* 1.88*  AST 42*  --   --   --   --   --   --   --   --   ALT 59*  --   --   --   --   --   --   --   --   ALKPHOS 214*  --   --   --   --   --   --   --   --  BILITOT 0.7  --   --   --   --   --   --   --   --   ALBUMIN 3.6  --   --   --   --   --   --   --   --   TSH  --   --  3.238  --   --   --   --   --   --   BNP 418.7*  --   --  537.9* 416.3* 471.8* 430.7* 284.6*  --   MG 2.5*   < >  --  2.2 2.2 2.2 2.2 2.2  --   CALCIUM 9.6   < >  --  9.6 9.2 10.0 9.1 9.6 9.1   < > = values in this interval not displayed.     Total Time in preparing paper work, data evaluation and todays exam - 35 minutes  Signature  -    Lala Lund M.D on 10/02/2022 at 7:27 AM   -  To page go to [email or web address]

## 2022-10-02 NOTE — TOC Transition Note (Signed)
Transition of Care [facility]) - CM/SW Discharge Note   Patient Details  Name: [name] MRN: [id number] Date of Birth: 13-May-1946  Transition of [facility]) CM/SW Contact:  Erenest Rasher, RN Phone Number: [phone] 10/02/2022, 1:58 PM   Clinical Narrative:    TOC CM spoke to [facility] rep, [place]. Offered semi-private bed for SNF rehab. Pt, wife and son accepted bed at [facility]. Disclaimer give that [facility] has a COVID in the building. Made family aware. Facility will need pt to come with Eliquis and Copaxone.to facility. Contacted [facility].     Final next level of care: Skilled Nursing Facility Barriers to Discharge: No Barriers Identified   Patient Goals and CMS Choice CMS Medicare.gov Compare Post Acute Care list provided to:: Patient Represenative (must comment) (wife) Choice offered to / list presented to : Spouse  Discharge Placement                         Discharge Plan and Services Additional resources added to the After Visit Summary for     Discharge Planning Services: CM Consult Post Acute Care Choice: Agency                               Social Determinants of Health (SDOH) Interventions SDOH Screenings   Food Insecurity: No Food Insecurity (09/30/2022)  Housing: Low Risk  (09/30/2022)  Transportation Needs: No Transportation Needs (09/30/2022)  Utilities: Not At Risk (09/30/2022)  Financial Resource Strain: Low Risk  (07/07/2018)  Tobacco Use: Low Risk  (09/30/2022)     Readmission Risk Interventions     No data to display

## 2022-10-02 NOTE — Care Management Important Message (Signed)
Important Message  Patient Details  Name: [name] MRN: [id number] Date of Birth: August 14, 1946   Medicare Important Message Given:  Yes     Shelda Altes 10/02/2022, 8:09 AM

## 2022-10-08 ENCOUNTER — Other Ambulatory Visit: Payer: Self-pay | Admitting: Diagnostic Neuroimaging

## 2022-10-08 ENCOUNTER — Telehealth: Payer: Self-pay | Admitting: Diagnostic Neuroimaging

## 2022-10-08 DIAGNOSIS — I480 Paroxysmal atrial fibrillation: Secondary | ICD-10-CM | POA: Diagnosis not present

## 2022-10-08 DIAGNOSIS — L03113 Cellulitis of right upper limb: Secondary | ICD-10-CM | POA: Diagnosis not present

## 2022-10-08 DIAGNOSIS — M7989 Other specified soft tissue disorders: Secondary | ICD-10-CM

## 2022-10-08 DIAGNOSIS — N179 Acute kidney failure, unspecified: Secondary | ICD-10-CM | POA: Diagnosis not present

## 2022-10-08 DIAGNOSIS — Z7689 Persons encountering health services in other specified circumstances: Secondary | ICD-10-CM

## 2022-10-08 DIAGNOSIS — I10 Essential (primary) hypertension: Secondary | ICD-10-CM | POA: Diagnosis not present

## 2022-10-08 DIAGNOSIS — M25521 Pain in right elbow: Secondary | ICD-10-CM | POA: Diagnosis not present

## 2022-10-08 DIAGNOSIS — I5032 Chronic diastolic (congestive) heart failure: Secondary | ICD-10-CM | POA: Diagnosis not present

## 2022-10-08 DIAGNOSIS — R600 Localized edema: Secondary | ICD-10-CM | POA: Diagnosis not present

## 2022-10-08 DIAGNOSIS — D649 Anemia, unspecified: Secondary | ICD-10-CM | POA: Diagnosis not present

## 2022-10-08 MED ORDER — COPAXONE 40 MG/ML ~~LOC~~ SOSY: PREFILLED_SYRINGE | SUBCUTANEOUS | 2 refills | Status: DC

## 2022-10-08 NOTE — Telephone Encounter (Signed)
Pt said pharmacy informed need a prescription for COPAXONE 40 MG/ML SOSY for 90-day supply. [employer] will only cover at [employer],  [employer]: [phone]

## 2022-10-08 NOTE — Telephone Encounter (Signed)
Typically this would need to go through a specialty pharmacy vs a local pharmacy. Looks like [facility] is who we should use. We will forward to them.

## 2022-10-09 DIAGNOSIS — L03113 Cellulitis of right upper limb: Secondary | ICD-10-CM | POA: Diagnosis not present

## 2022-10-09 DIAGNOSIS — M19021 Primary osteoarthritis, right elbow: Secondary | ICD-10-CM | POA: Diagnosis not present

## 2022-10-11 NOTE — Progress Notes (Deleted)
Cardiology Office Note Date:  10/11/2022  Patient ID:  Timothy, Henry 1946-07-05, MRN [id number] PCP:  Timothy Brothers, MD  Cardiologist:  Dr. Haroldine Henry Electrophysiologist: ***  ***refresh   Chief Complaint: *** wound check  History of Present Illness: [name] is a 77 y.o. male with history of retired Engineer, production professor with a history of advanced multiple sclerosis,   Type A aortic dissection in 2000 ( hemi-arch replacement of the ascending thoracic aorta and resuspension of the native aortic valve), followed regularly since then with CT angiograms because of chronic dissection of the remaining aorta and moderate aneurysmal dilatation of the transverse aortic arch. Because of the patient's underlying multiple sclerosis with severe generalized weakness patient was not felt to be candidate for surgical intervention. He was referred to [facility] for a second opinion and again not felt to be candidate for elective surgery   Right heart failure with severe TR (not felt to be a surgical/intervention candidate 2/2 his severe MS), permanent AF, CKD (IV, follows with Dr. Lewie Henry), symptomatic bradycardia w/PPM  Admitted 09/26/22 with marked bradycardia requiring temp pacing wire, AKI Underwent PPM implant1/15/24 w/Dr. Lovena Henry Discharged 10/02/22 to resume Eliquis 10/04/22  *** site *** eliquis, bleeding, dose, labs *** restrictions  Device information Biotronik single Henry PPM implanted 09/28/22   Past Medical History:  Diagnosis Date   Anemia    Aneurysm of aortic arch ([place]) 04/04/2012   Chronic aneurysmal dilatation of aortic arch with chronic type A aortic dissection, s/p replacement of ascending thoracic aorta   Aortic dissection ([place]) 11/05/1998   S/P emergency repair of acute type A aortic dissection with resuspension of native aortic valve   Aortic dissection, thoracic ([place])    Aortic root aneurysm (H[facility])    Atrial fibrillation ([place]) 01/30/2013    Atrial fibrillation    Bilateral lower extremity edema    BPH (benign prostatic hyperplasia)    CHF (congestive heart failure) ([place]) Jun 24, 1946   2D Echo - EF 50-55%, mild-moderately dilated right ventricle, mild-moderate tricuspid valve regurgitation, moderately dilated right atrium   Depression    Dyslipidemia    Exertional shortness of breath    "for awhile now" (09/11/2013)   Hemorrhoids    History of blood transfusion    Hypertension    Mitral regurgitation 02/20/2013   Multiple sclerosis (HCC)    Neuromuscular disorder (HCC)    Pleural effusion, right, large 01/30/2013   Severe tricuspid valve regurgitation 01/30/2013   Severe tricuspid regurg by recent 2-D echo with a dilated tricuspid annulus, moderate pulmonary hypertension and biatrial enlargement    Thrombocytopenia [facility])     Past Surgical History:  Procedure Laterality Date   HEMORRHOID SURGERY  2009   Internal, external hemorrhoidectomy, general anesthesia,prone position. [Other]   Open reduction and internal fixation of right distal radius fracture using [employer] distal radius volar locking plate.  07/2005   Dr [name]   PACEMAKER IMPLANT N/A 09/28/2022   Procedure: PACEMAKER IMPLANT;  Surgeon: Timothy Lance, MD;  Location: [facility];  Service: Cardiovascular;  Laterality: N/A;   Repair of acute type A aortic dissection with resuspension of native aortic valve  10/15/1998   Dr [name]   TEE WITHOUT CARDIOVERSION N/A 02/17/2013   Procedure: TRANSESOPHAGEAL ECHOCARDIOGRAM (TEE);  Surgeon: Timothy Klein, MD;  Location: [facility] ENDOSCOPY;  Service: Cardiovascular;  Laterality: N/A;   TEMPORARY PACEMAKER N/A 09/26/2022   Procedure: TEMPORARY PACEMAKER;  Surgeon: Timothy Mocha, MD;  Location: [facility]  CV LAB;  Service: Cardiovascular;  Laterality: N/A;    Current Outpatient Medications  Medication Sig Dispense Refill   acetaminophen (TYLENOL) 325 MG tablet Take 2 tablets (650 mg total) by mouth every 6 (six)  hours as needed for mild pain (or Fever >/= 101).     apixaban (ELIQUIS) 5 MG TABS tablet Take 1 tablet (5 mg total) by mouth 2 (two) times daily. 60 tablet 11   baclofen (LIORESAL) 10 MG tablet Take 0.5 tablets (5 mg total) by mouth 3 (three) times daily as needed for muscle spasms. 2nd prn 30 each 0   Cholecalciferol (VITAMIN D3) 2000 UNITS TABS Take 2,000 Units by mouth daily.     COPAXONE 40 MG/ML SOSY INJECT ONE SYRINGE SUBCUTANEOUSLY 3 TIMES A WEEK AT LEAST 48 HOURS APART. ALLOW TO WARM TO ROOM TEMP FOR 20 MINUTES. REFRIGERATE. 36 mL 2   dalfampridine 10 MG TB12 TAKE 1 TABLET BY MOUTH 2 TIMES A DAY (APPROXIMATELY 12 HOURS APART). MAY BE TAKEN WITH OR WITHOUT FOOD. DO NOT CUT OR CRUSH. STORE AT ROOM T (Patient taking differently: Take 10 mg by mouth 2 (two) times daily. Approximately 12 hours apart. May be taken with or without food. Do not crush and store at room temp.) 99991111 tablet 2   folic acid (FOLVITE) 1 MG tablet Take 1 mg by mouth daily.     furosemide (LASIX) 40 MG tablet Take 1 tablet (40 mg total) by mouth daily as needed for edema or fluid. 60 tablet 0   metoprolol succinate (TOPROL-XL) 25 MG 24 hr tablet Take 25 mg by mouth daily.     Multiple Vitamins-Minerals (MULTIVITAMIN MEN) TABS Take 1 tablet by mouth daily.     QUEtiapine (SEROQUEL) 25 MG tablet Take 25-50 mg by mouth at bedtime.     No current facility-administered medications for this visit.    Allergies:   Farxiga [dapagliflozin]   Social History:  The patient  reports that he has never smoked. He has never used smokeless tobacco. He reports current alcohol use. He reports that he does not use drugs.   Family History:  The patient's family history includes Emphysema in his brother; Heart attack in his mother; Lung cancer in his brother; Thyroid cancer in his father.  ROS:  Please see the history of present illness.    All other systems are reviewed and otherwise negative.   PHYSICAL EXAM:  VS:  There were no vitals  taken for this visit. BMI: There is no height or weight on file to calculate BMI. Well nourished, well developed, in no acute distress HEENT: normocephalic, atraumatic Neck: no JVD, carotid bruits or masses Cardiac:  *** RRR; no significant murmurs, no rubs, or gallops Lungs:  *** CTA b/l, no wheezing, rhonchi or rales Abd: soft, nontender MS: no deformity or *** atrophy Ext: *** no edema Skin: warm and dry, no rash Neuro:  No gross deficits appreciated Psych: euthymic mood, full affect  *** PPM***  site is stable, no tethering or discomfort   EKG:  not done today  Device interrogation done today and reviewed by myself:  ***   07/16/22: TTE 1. Left ventricular ejection fraction, by estimation, is 55 to 60%. The  left ventricle has normal function. The left ventricle has no regional  wall motion abnormalities. Left ventricular diastolic function could not  be evaluated. The average left  ventricular global longitudinal strain is -18.0 %. The global longitudinal  strain is normal.   2. Right ventricular systolic  function is mildly reduced. The right  ventricular size is moderately enlarged. There is normal pulmonary artery  systolic pressure. The estimated right ventricular systolic pressure is  0000000 mmHg.   3. Left atrial size was moderately dilated.   4. Right atrial size was severely dilated.   5. The mitral valve is normal in structure. Mild mitral valve  regurgitation. No evidence of mitral stenosis.   6. Tricuspid valve regurgitation is mild to moderate.   7. The aortic valve is tricuspid. Aortic valve regurgitation is mild.   8. Aortic dilatation noted. There is severe dilatation sinus of valsalva,  noncoronary, measuring 61 mm.   9. The inferior vena cava is dilated in size with <50% respiratory  variability, suggesting right atrial pressure of 15 mmHg.   Comparison(s): No significant change from prior study. Prior images  reviewed side by side.   Recent  Labs: 09/26/2022: ALT 59; TSH 3.238 10/01/2022: B Natriuretic Peptide 284.6; Magnesium 2.2 10/02/2022: BUN 63; Creatinine, Ser 1.88; Hemoglobin 9.7; Platelets 144; Potassium 5.0; Sodium 141  No results found for requested labs within last 365 days.   Estimated Creatinine Clearance: 36 mL/min (A) (by C-G formula based on SCr of 1.88 mg/dL (H)).   Wt Readings from Last 3 Encounters:  10/02/22 187 lb 6.3 oz (85 kg)  06/20/21 180 lb 6.4 oz (81.8 kg)  05/27/21 169 lb (76.7 kg)     Other studies reviewed: Additional studies/records reviewed today include: summarized above  ASSESSMENT AND PLAN:  PPM ***  Permanent AFib CHA2DS2Vasc is 4, on eliquis, *** appropriately dosed *** rates  3. Chronic CHF (diastolic and RV failure) *** Mild reduced RV function, mild-mod TR on his last echo C/w HF team     Disposition: F/u with ***  Current medicines are reviewed at length with the patient today.  The patient did not have any concerns regarding medicines.  Venetia Night, PA-C 10/11/2022 9:24 AM     [address] [place]  [place] [phone] (office)  [phone] (fax)

## 2022-10-12 ENCOUNTER — Telehealth: Payer: Self-pay

## 2022-10-12 NOTE — Telephone Encounter (Signed)
Spoke with patients wife and she says patient is in rehab and she doesn't think he has the monitor with him. I have called and left vm on patients phone with the DC direct number

## 2022-10-12 NOTE — Telephone Encounter (Signed)
Biotronik alert: The patient is scheduled for an HM follow-up on Oct 10, 2022 The HM follow-up transmission has not arrived yet. The time of arrival depends on the HM transmission time of the implanted device.   Patient missed his transmission.  Will have team reach out to him to obtain.

## 2022-10-13 ENCOUNTER — Telehealth: Payer: Self-pay | Admitting: Internal Medicine

## 2022-10-13 ENCOUNTER — Encounter: Payer: Medicare Other | Admitting: Physician Assistant

## 2022-10-13 NOTE — Telephone Encounter (Signed)
Patient calling to find out if he can put weight on his left arm.

## 2022-10-13 NOTE — Telephone Encounter (Signed)
Left message for patient to return call tomorrow.  Also left instructions not to do any pushing or pulling with his left arm anything heavier than a gallon of milk and not to lay on the side his device was implanted. I did advise he keep his appointment for 2/6 as he missed his appointment today.

## 2022-10-20 ENCOUNTER — Encounter: Payer: Medicare Other | Admitting: Internal Medicine

## 2022-10-20 DIAGNOSIS — R2231 Localized swelling, mass and lump, right upper limb: Secondary | ICD-10-CM | POA: Diagnosis not present

## 2022-10-22 ENCOUNTER — Ambulatory Visit: Payer: No Typology Code available for payment source | Attending: Internal Medicine

## 2022-10-22 DIAGNOSIS — I442 Atrioventricular block, complete: Secondary | ICD-10-CM | POA: Diagnosis present

## 2022-10-22 DIAGNOSIS — M109 Gout, unspecified: Secondary | ICD-10-CM | POA: Diagnosis not present

## 2022-10-22 DIAGNOSIS — I422 Other hypertrophic cardiomyopathy: Secondary | ICD-10-CM | POA: Diagnosis not present

## 2022-10-22 DIAGNOSIS — D649 Anemia, unspecified: Secondary | ICD-10-CM | POA: Diagnosis not present

## 2022-10-22 DIAGNOSIS — N1831 Chronic kidney disease, stage 3a: Secondary | ICD-10-CM | POA: Diagnosis not present

## 2022-10-22 NOTE — Patient Instructions (Addendum)
    After Your Pacemaker   Monitor your pacemaker site for redness, swelling, and drainage. Call the device clinic at [phone] if you experience these symptoms or fever/chills.  Your incision was closed with Steri-strips or staples:  You may shower 7 days after your procedure and wash your incision with soap and water. Avoid lotions, ointments, or perfumes over your incision until it is well-healed.  You are free to shower now.  Wash and dry wound area first with clean cloth/towel, be gentle with area.   You may use a hot tub or a pool after your wound check appointment if the incision is completely closed.  Do not lift, push or pull greater than 10 pounds with the affected arm until 6 weeks after your procedure. There are no other restrictions in arm movement after your wound check appointment. Until After February 26th.  Patient no longer needs sling and can move arm/shoulder area.  He should fold pillows on left side to continue not lying on left side while in bed for the 6 week period.   Patient has above restrictions to left arm until after February 26th. Physical therapy should be limited on that side to respect lift, push, pull restrictions (nothing heavier than a gallon of milk).     You may drive, unless driving has been restricted by your healthcare providers.  Remote monitoring is used to monitor your pacemaker from home. This monitoring is scheduled every 91 days by our office. It allows [place] to keep an eye on the functioning of your device to ensure it is working properly. You will routinely see your Electrophysiologist annually (more often if necessary).

## 2022-10-23 LAB — CUP PACEART INCLINIC DEVICE CHECK
Battery Remaining Longevity: 98 mo
Brady Statistic RV Percent Paced: 83 %
Date Time Interrogation Session: 20240208092636
Implantable Lead Connection Status: 753985
Implantable Lead Implant Date: 20240115
Implantable Lead Location: 753860
Implantable Lead Model: 377
Implantable Lead Serial Number: 8001096875
Implantable Pulse Generator Implant Date: 20240115
Lead Channel Impedance Value: 604 Ohm
Lead Channel Pacing Threshold Amplitude: 1.1 V
Lead Channel Pacing Threshold Pulse Width: 0.4 ms
Lead Channel Sensing Intrinsic Amplitude: 9.5 mV
Pulse Gen Model: 407157
Pulse Gen Serial Number: 70421235

## 2022-10-23 NOTE — Progress Notes (Signed)
Wound check appointment. Device interrogation with industry Microbiologist) assistance. Steri-strips removed. Wound without redness or edema. Incision edges approximated, wound well healed. Normal device function. Thresholds, sensing, and impedances consistent with implant measurements. Device programmed at 3.5V/auto capture programmed on for extra safety margin until 3 month visit. Histogram distribution appropriate for patient and level of activity. No mode switches or high ventricular rates noted. Patient educated about wound care, arm mobility, lifting restrictions. ROV in 3 months with implanting physician.

## 2022-10-29 DIAGNOSIS — K5900 Constipation, unspecified: Secondary | ICD-10-CM | POA: Diagnosis not present

## 2022-10-29 DIAGNOSIS — J302 Other seasonal allergic rhinitis: Secondary | ICD-10-CM | POA: Diagnosis not present

## 2022-10-29 DIAGNOSIS — U071 COVID-19: Secondary | ICD-10-CM | POA: Diagnosis not present

## 2022-11-03 DIAGNOSIS — R2243 Localized swelling, mass and lump, lower limb, bilateral: Secondary | ICD-10-CM | POA: Diagnosis not present

## 2022-11-03 DIAGNOSIS — I10 Essential (primary) hypertension: Secondary | ICD-10-CM | POA: Diagnosis not present

## 2022-11-03 DIAGNOSIS — S91312A Laceration without foreign body, left foot, initial encounter: Secondary | ICD-10-CM | POA: Diagnosis not present

## 2022-11-03 DIAGNOSIS — U071 COVID-19: Secondary | ICD-10-CM | POA: Diagnosis not present

## 2022-11-12 DIAGNOSIS — R600 Localized edema: Secondary | ICD-10-CM | POA: Diagnosis not present

## 2022-11-12 DIAGNOSIS — M25561 Pain in right knee: Secondary | ICD-10-CM | POA: Diagnosis not present

## 2022-11-17 ENCOUNTER — Other Ambulatory Visit: Payer: Self-pay | Admitting: *Deleted

## 2022-11-17 NOTE — Patient Outreach (Addendum)
Late entry for 11/16/22. Per [facility] Mr. Aden resides in [facility]. Screening for potential care coordination services as benefit of health plan and PCP.   Secure communication sent to [place], [employer] social worker to inquire whether Mr. Foerst is under skilled stay or ILF.   Will follow for potential [facility] care coordination needs as appropriate.    Marthenia Rolling, MSN, RN,BSN [place] Acute Care Coordinator [phone] (Direct dial)

## 2022-11-27 ENCOUNTER — Other Ambulatory Visit: Payer: Self-pay | Admitting: *Deleted

## 2022-11-27 NOTE — Patient Outreach (Addendum)
[place] Coordinator follow up. Confirmed with Nature conservation officer, Mr. Guarnieri discharged from [facility] on 11/19/22. Per [facility], [facility] home health was arranged.  PCP office [facility] has [facility] care management services.   Will send notification to [facility].  Marthenia Rolling, MSN, RN,BSN [place] Acute Care Coordinator [phone] (Direct dial)

## 2022-12-02 ENCOUNTER — Telehealth: Payer: Self-pay | Admitting: Cardiovascular Disease

## 2022-12-02 NOTE — Telephone Encounter (Signed)
Called patient to advise his pacemaker is working normally. Explained that his pacemaker LRL is set at 70 bpm which his heart rate will not go below but his heart rate can above 70 bpm. Patient voiced understanding and appreciative of call.

## 2022-12-02 NOTE — Telephone Encounter (Signed)
Patient c/o Palpitations:  High priority if patient c/o lightheadedness, shortness of breath, or chest pain  How long have you had palpitations/irregular HR/ Afib? Are you having the symptoms now?  Yes  Are you currently experiencing lightheadedness, SOB or CP?   No  Do you have a history of afib (atrial fibrillation) or irregular heart rhythm?    No  Have you checked your BP or HR? (document readings if available):   BP  152/91  HR 90  Are you experiencing any other symptoms?  No  Patient is concerned he has been having racing heart for the last 2 days.

## 2022-12-02 NOTE — Telephone Encounter (Signed)
Patient states HR 90.  Has been for approx. 2 days.  Asymptomatic.  Confirmed no palpitation, no SOB, no racing heart rate, no dizziness or lightheaded.  He had PT and they found heart rate at 90, this was when he noticed.He has checked it periodically over the last few days and it is still in the 90's.  Took V/S on the phone and BP 162/93  HR 91 (before medication, he will take his BP meds now).  He states Dr Lysle Rubens at [facility] added Losartan 50 mg daily for 1 week and then increased  to Losartan 100 mg Daily, he is on his second day of the 100 mg dose.  He is concerned as his pacemaker is set to 70 and his pulse is at 90.

## 2022-12-08 NOTE — Telephone Encounter (Signed)
Thanks for explaining this.

## 2022-12-21 ENCOUNTER — Telehealth: Payer: Self-pay | Admitting: Internal Medicine

## 2022-12-21 NOTE — Telephone Encounter (Signed)
Patient is requesting papers he received after having pacemaker implanted be sent to him again. He states he lost these and would

## 2022-12-21 NOTE — Telephone Encounter (Signed)
Returned call to patient.  Patient states he would like discharge summary from hospital mailed to him (from January 2024).  Confirmed mailing address. Discharge summary and pacemaker instructions mailed to patient.

## 2022-12-21 NOTE — Telephone Encounter (Signed)
Continuation:   Patient is requesting papers he received after having pacemaker implanted be sent to him again. He states he lost these and would like these mailed to him.

## 2022-12-30 ENCOUNTER — Ambulatory Visit (INDEPENDENT_AMBULATORY_CARE_PROVIDER_SITE_OTHER): Payer: Medicare Other

## 2022-12-30 DIAGNOSIS — I442 Atrioventricular block, complete: Secondary | ICD-10-CM

## 2022-12-30 LAB — CUP PACEART REMOTE DEVICE CHECK
Date Time Interrogation Session: 20240417065118
Implantable Lead Connection Status: 753985
Implantable Lead Implant Date: 20240115
Implantable Lead Location: 753860
Implantable Lead Model: 377
Implantable Lead Serial Number: 8001096875
Implantable Pulse Generator Implant Date: 20240115
Pulse Gen Model: 407157
Pulse Gen Serial Number: 70421235

## 2023-01-07 ENCOUNTER — Encounter: Payer: Self-pay | Admitting: Internal Medicine

## 2023-01-07 ENCOUNTER — Ambulatory Visit: Payer: Medicare Other | Attending: Internal Medicine | Admitting: Internal Medicine

## 2023-01-07 VITALS — BP 164/82 | HR 78 | Ht 68.0 in | Wt 170.0 lb

## 2023-01-07 DIAGNOSIS — I5032 Chronic diastolic (congestive) heart failure: Secondary | ICD-10-CM

## 2023-01-07 DIAGNOSIS — I10 Essential (primary) hypertension: Secondary | ICD-10-CM | POA: Diagnosis not present

## 2023-01-07 DIAGNOSIS — Z950 Presence of cardiac pacemaker: Secondary | ICD-10-CM | POA: Diagnosis not present

## 2023-01-07 DIAGNOSIS — I480 Paroxysmal atrial fibrillation: Secondary | ICD-10-CM | POA: Diagnosis not present

## 2023-01-07 NOTE — Patient Instructions (Addendum)
Medication Instructions:  Your physician recommends that you continue on your current medications as directed. Please refer to the Current Medication list given to you today.  *If you need a refill on your cardiac medications before your next appointment, please call your pharmacy*  Lab Work: None ordered.  If you have labs (blood work) drawn today and your tests are completely normal, you will receive your results only by: MyChart Message (if you have MyChart) OR A paper copy in the mail If you have any lab test that is abnormal or we need to change your treatment, we will call you to review the results.  Testing/Procedures: None ordered.  Follow-Up: At [facility], you and your health needs are our priority.  As part of our continuing mission to provide you with exceptional heart care, we have created designated Provider Care Teams.  These Care Teams include your primary Cardiologist (physician) and Advanced Practice Providers (APPs -  Physician Assistants and Nurse Practitioners) who all work together to provide you with the care you need, when you need it.  Your next appointment:   1 year(s)  The format for your next appointment:   In Person  Provider:   Lewayne Bunting, MD{or one of the following Advanced Practice Providers on your designated Care Team:   Francis Dowse, [place] Casimiro Needle "Mardelle Matte" Lanna Poche, [place]  Remote monitoring is used to monitor your Pacemaker from home. This monitoring reduces the number of office visits required to check your device to one time per year. It allows [place] to keep an eye on the functioning of your device to ensure it is working properly. You are scheduled for a device check from home on 03/31/23. You may send your transmission at any time that day. If you have a wireless device, the transmission will be sent automatically. After your physician reviews your transmission, you will receive a postcard with your next transmission date.

## 2023-01-07 NOTE — Progress Notes (Signed)
HPI [name] returns today for PPM followup. He is a pleasant 77 yo man with a h/o atrial fib with a slow VR s/p PPM Insertion. He also has chronic renal insufficiency. In addition he has chronic diastolic heart failure and severe TR.  Allergies  Allergen Reactions   Farxiga [Dapagliflozin] Other (See Comments)    B/P dropped too low and extreme vertigo     Current Outpatient Medications  Medication Sig Dispense Refill   acetaminophen (TYLENOL) 325 MG tablet Take 2 tablets (650 mg total) by mouth every 6 (six) hours as needed for mild pain (or Fever >/= 101).     amLODipine-valsartan (EXFORGE) 5-320 MG tablet Take 1 tablet by mouth daily.     apixaban (ELIQUIS) 5 MG TABS tablet Take 1 tablet (5 mg total) by mouth 2 (two) times daily. 60 tablet 11   baclofen (LIORESAL) 10 MG tablet Take 0.5 tablets (5 mg total) by mouth 3 (three) times daily as needed for muscle spasms. 2nd prn 30 each 0   Cholecalciferol (VITAMIN D3) 2000 UNITS TABS Take 2,000 Units by mouth daily.     COPAXONE 40 MG/ML SOSY INJECT ONE SYRINGE SUBCUTANEOUSLY 3 TIMES A WEEK AT LEAST 48 HOURS APART. ALLOW TO WARM TO ROOM TEMP FOR 20 MINUTES. REFRIGERATE. 36 mL 2   dalfampridine 10 MG TB12 TAKE 1 TABLET BY MOUTH 2 TIMES A DAY (APPROXIMATELY 12 HOURS APART). MAY BE TAKEN WITH OR WITHOUT FOOD. DO NOT CUT OR CRUSH. STORE AT ROOM T (Patient taking differently: Take 10 mg by mouth 2 (two) times daily. Approximately 12 hours apart. May be taken with or without food. Do not crush and store at room temp.) 180 tablet 2   folic acid (FOLVITE) 1 MG tablet Take 1 mg by mouth daily.     furosemide (LASIX) 40 MG tablet Take 1 tablet (40 mg total) by mouth daily as needed for edema or fluid. 60 tablet 0   metoprolol succinate (TOPROL-XL) 25 MG 24 hr tablet Take 25 mg by mouth daily.     Multiple Vitamins-Minerals (MULTIVITAMIN MEN) TABS Take 1 tablet by mouth daily.     QUEtiapine (SEROQUEL) 25 MG tablet Take 25-50 mg by mouth at  bedtime.     No current facility-administered medications for this visit.     Past Medical History:  Diagnosis Date   Anemia    Aneurysm of aortic arch 04/04/2012   Chronic aneurysmal dilatation of aortic arch with chronic type A aortic dissection, s/p replacement of ascending thoracic aorta   Aortic dissection 11/05/1998   S/P emergency repair of acute type A aortic dissection with resuspension of native aortic valve   Aortic dissection, thoracic    Aortic root aneurysm    Atrial fibrillation 01/30/2013   Atrial fibrillation    Bilateral lower extremity edema    BPH (benign prostatic hyperplasia)    CHF (congestive heart failure) Aug 20, 1946   2D Echo - EF 50-55%, mild-moderately dilated right ventricle, mild-moderate tricuspid valve regurgitation, moderately dilated right atrium   Depression    Dyslipidemia    Exertional shortness of breath    "for awhile now" (09/11/2013)   Hemorrhoids    History of blood transfusion    Hypertension    Mitral regurgitation 02/20/2013   Multiple sclerosis    Neuromuscular disorder    Pleural effusion, right, large 01/30/2013   Severe tricuspid valve regurgitation 01/30/2013   Severe tricuspid regurg by recent 2-D echo with a dilated tricuspid annulus,  moderate pulmonary hypertension and biatrial enlargement    Thrombocytopenia     ROS:   All systems reviewed and negative except as noted in the HPI.   Past Surgical History:  Procedure Laterality Date   HEMORRHOID SURGERY  2009   Internal, external hemorrhoidectomy, general anesthesia,prone position. [Other]   Open reduction and internal fixation of right distal radius fracture using [employer] distal radius volar locking plate.  07/2005   Dr Magnus Ivan   PACEMAKER IMPLANT N/A 09/28/2022   Procedure: PACEMAKER IMPLANT;  Surgeon: Marinus Maw, MD;  Location: [facility] INVASIVE CV LAB;  Service: Cardiovascular;  Laterality: N/A;   Repair of acute type A aortic dissection with resuspension of  native aortic valve  10/15/1998   Dr Cornelius Moras   TEE WITHOUT CARDIOVERSION N/A 02/17/2013   Procedure: TRANSESOPHAGEAL ECHOCARDIOGRAM (TEE);  Surgeon: Thurmon Fair, MD;  Location: [facility] ENDOSCOPY;  Service: Cardiovascular;  Laterality: N/A;   TEMPORARY PACEMAKER N/A 09/26/2022   Procedure: TEMPORARY PACEMAKER;  Surgeon: Tonny Bollman, MD;  Location: [facility] INVASIVE CV LAB;  Service: Cardiovascular;  Laterality: N/A;     Family History  Problem Relation Age of Onset   Thyroid cancer Father        smoked   Heart attack Mother    Lung cancer Brother        smoked   Emphysema Brother        smoked     Social History   Socioeconomic History   Marital status: Married    Spouse name: Debbie   Number of children: 2   Years of education: Ph.D   Highest education level: Not on file  Occupational History   Occupation: Retired Professor    Associate Professor: [employer]    Comment: Statistics  Tobacco Use   Smoking status: Never   Smokeless tobacco: Never  Vaping Use   Vaping Use: Never used  Substance and Sexual Activity   Alcohol use: Yes    Comment: 09/11/2013 "might have a drink once/month"   Drug use: No   Sexual activity: Not Currently  Other Topics Concern   Not on file  Social History Narrative   Patient lives at home with spouse.    Caffeine Use: eat a lot of chocolate, sodas, coffee and tea occasionally   Right Handed   Social Determinants of Health   Financial Resource Strain: Low Risk  (07/07/2018)   Overall Financial Resource Strain (CARDIA)    Difficulty of Paying Living Expenses: Not hard at all  Food Insecurity: No Food Insecurity (09/30/2022)   Hunger Vital Sign    Worried About Running Out of Food in the Last Year: Never true    Ran Out of Food in the Last Year: Never true  Transportation Needs: No Transportation Needs (09/30/2022)   PRAPARE - Administrator, Civil Service (Medical): No    Lack of Transportation (Non-Medical): No  Physical Activity:  Not on file  Stress: Not on file  Social Connections: Not on file  Intimate Partner Violence: Not At Risk (09/30/2022)   Humiliation, Afraid, Rape, and Kick questionnaire    Fear of Current or Ex-Partner: No    Emotionally Abused: No    Physically Abused: No    Sexually Abused: No     BP (!) 164/82   Pulse 78   Ht  (1.727 m)   Wt 170 lb (77.1 kg)   SpO2 99%   BMI 25.85 kg/m   Physical Exam:  Well  appearing NAD HEENT: Unremarkable Neck:  No JVD, no thyromegally Lymphatics:  No adenopathy Back:  No CVA tenderness Lungs:  Clear HEART:  Regular rate rhythm, no murmurs, no rubs, no clicks Abd:  soft, positive bowel sounds, no organomegally, no rebound, no guarding Ext:  2 plus pulses, no edema, no cyanosis, no clubbing Skin:  No rashes no nodules Neuro:  CN II through XII intact, motor grossly intact  EKG Atrial fib with ventricular pacing DEVICE  Normal device function.  See PaceArt for details.   Assess/Plan: CHB - he is pacing about 81% of the time.  PPM -his Biotronik VVI PM is working normally. Atrial fib- his rates are well controlled.  Sharlot Gowda Darlyne Schmiesing,MD

## 2023-01-27 ENCOUNTER — Encounter: Payer: Self-pay | Admitting: Neurology

## 2023-01-27 ENCOUNTER — Other Ambulatory Visit (HOSPITAL_COMMUNITY): Payer: Self-pay

## 2023-01-28 ENCOUNTER — Other Ambulatory Visit (HOSPITAL_COMMUNITY): Payer: Self-pay

## 2023-01-28 ENCOUNTER — Telehealth: Payer: Self-pay

## 2023-01-28 NOTE — Telephone Encounter (Signed)
Pharmacy Patient Advocate Encounter   Received notification from [facility] [employer] that prior authorization for Dalfampridine ER 10MG  er tablets is required/requested.   PA submitted on 01/28/2023 to (ins) [employer] via [employer] or [facility]) confirmation # [id number] Status is pending

## 2023-01-28 NOTE — Telephone Encounter (Signed)
Patient Advocate Encounter  Prior Authorization for DALFAMPRIDINE 10MG  has been approved with [facility] [employer].    PA# [id number] Effective dates: 5.16.24 through 5.16.25  Per [facility] test claim, UNABLE TO COMPLETE AT THIS LOCATION

## 2023-02-01 NOTE — Progress Notes (Signed)
Remote pacemaker transmission.   

## 2023-02-18 ENCOUNTER — Encounter: Payer: Self-pay | Admitting: Diagnostic Neuroimaging

## 2023-02-18 ENCOUNTER — Other Ambulatory Visit: Payer: Self-pay | Admitting: *Deleted

## 2023-02-18 DIAGNOSIS — R269 Unspecified abnormalities of gait and mobility: Secondary | ICD-10-CM

## 2023-02-18 DIAGNOSIS — G35 Multiple sclerosis: Secondary | ICD-10-CM

## 2023-02-18 NOTE — Telephone Encounter (Signed)
Spoke to patient  Per Dr. Marjory Lies ok to continue PT  Pt was very appreciative and thanked me for calling order is pending for approval

## 2023-02-19 ENCOUNTER — Telehealth: Payer: Self-pay | Admitting: Diagnostic Neuroimaging

## 2023-02-19 NOTE — Telephone Encounter (Signed)
Referral faxed to [facility]: Phone: [phone]  Fax: [phone] 

## 2023-03-02 ENCOUNTER — Encounter: Payer: Self-pay | Admitting: Diagnostic Neuroimaging

## 2023-03-02 ENCOUNTER — Ambulatory Visit (INDEPENDENT_AMBULATORY_CARE_PROVIDER_SITE_OTHER): Payer: Medicare Other | Admitting: Diagnostic Neuroimaging

## 2023-03-02 DIAGNOSIS — R269 Unspecified abnormalities of gait and mobility: Secondary | ICD-10-CM

## 2023-03-02 DIAGNOSIS — G35 Multiple sclerosis: Secondary | ICD-10-CM | POA: Diagnosis not present

## 2023-03-02 MED ORDER — DALFAMPRIDINE ER 10 MG PO TB12: 10.0000 mg | ORAL_TABLET | Freq: Two times a day (BID) | ORAL | 12 refills | Status: DC

## 2023-03-02 MED ORDER — BACLOFEN 10 MG PO TABS: 5.0000 mg | ORAL_TABLET | Freq: Every day | ORAL | 6 refills | Status: DC

## 2023-03-02 NOTE — Progress Notes (Signed)
PATIENT: [name] DOB: 12/08/1945   REASON FOR VISIT: follow up for MS HISTORY FROM: patient  Chief Complaint  Patient presents with   Follow-up    Patient in room #6 with his son. Patient states he was sick not to all along and he feels better.      HISTORY OF PRESENT ILLNESS:  UPDATE (03/02/23, VRP): Since last visit, was in the hospital for AKI, afib, bradycardia, then pacemaker placement. MS symptoms stable. Tolerating meds.   UPDATE (09/29/21, VRP): Since last visit, symptoms and dz are progressing. More gait and balance issues.  UPDATE (09/23/20, VRP): Since last visit, doing slightly worse with MS symptoms, fatigue, memory weakness. Symptoms are progressive. Severity is moderate. No alleviating or aggravating factors.   UPDATE (12/18/19, VRP): Since last visit, doing well. Symptoms are stable. Still working on right knee issues. Has stationary bike at home now. No alleviating or aggravating factors. Tolerating meds. Playing chess a lot nowadays.   UPDATE (09/18/19, VRP): Since last visit, doing well until 2 weeks ago (mild cold sxs x 1 day; then generalized weakness). Right knee locked up; saw ortho and it was drained and injected --> now better.   UPDATE (04/18/19, VRP): Since last visit, doing WELL. Symptoms are stable / improved slightly. No alleviating or aggravating factors. Tolerating copaxone.  Now back to swimming exercises.  UPDATE (10/05/18, VRP): Since last visit, doing about the same. Symptoms are stable. Patient wants to consider staying on tecfidera, in spite of low lymphocyte count and risk of PML. No alleviating or aggravating factors.  UPDATE (08/22/18, VRP): Since last visit, symptoms are slightly progressed (gait issues). No alleviating or aggravating factors. Tolerating tecfidera and ampyra. Staying active with swimming.  UPDATE (08/18/17, VRP): Since last visit, tolerating meds. No alleviating or aggravating factors. Ampyra helping somewhat. Having to  lean on walker more.   UPDATE 08/18/16: Since last visit, tolerating meds. No new issues. Some slight subjective progression of symptoms. Had a fall recently with subsequent right forearm bruising.   UPDATE 02/11/16: Since last visit, tolerating tecfidera. Some slow progression. Asking about new MS medications.  UPDATE 08/13/15: Tolerating meds. More urinary issues. Using walker mainly.   UPDATE 04/16/15: Since last visit, patient is stable. Getting home visits with palliative care.   UPDATE 02/05/15: Discussed palliative care consult and options.   UPDATE 08/02/14: Since last visit, feels stable. Tolerating tecfidera and ampyra. No new events. Uses cane and walker.  Swimming more. Wife notes memory issues slightly worse.   UPDATE 01/31/14: Since last visit, continues on tecfidera. No progression of neuro sxs or MS sxs. Asking about driving again. Has not driven in last 3-4 months. Ongoing medical mgmt of his cardiac issues, and he is not a surgical candidate.  UPDATE 10/25/13 (LL): Patient comes in for revisit, since last visit he had worsening right-sided HF and was hospitalized.  He was seen by Dr. Cornelius Moras for surgical consultation to consider tricuspid valve repair and possible Maze procedure.  He was referred to the advanced heart failure clinic who has been adjusting his lasix to manage his lower extremity edema and shortness of breath.  He is to be seen by Dr. Cornelius Moras again for follow up in another month, and says Dr. Gala Romney felt that surgery was needed.  He comes in today to discuss with Dr. Marjory Lies how his MS would be affected by having the surgery.  His wife feels like he is having more difficulty walking since last visit, but he has  not had any falls.  He is tolerating Tecfidera and Ampyra well.  UPDATE 07/19/13 (VP): Patient continues to have progressive deterioration in cognitive ability, memory, mood lability, gait stability. Patient is on tecfidera now, and tolerating without side effects.  Patient feels when he came off of Betaseron he had some accelerated progression of MS that this has slowed down since starting tecfidera. Unclear whether patient's current level of functioning is on the same trajectory as the decline that was noted while patient was on Betaseron, or whether there has been some acceleration of deterioration. Other factors include some marital discord and comorbid depression. Other contributing factors include his cardiac issues including atrial fibrillation, on Coumadin, as well as severe tricuspid valve regurgitation leading to peripheral edema. Unfortunately patient is not felt to be a good surgical candidate.   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 The patient is accompanied by his wife. Pt. States he is doing worse since his last visit in Feb. He is walking slower and having more difficulty standing from a seated position. 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 Dr. Sandria Manly had talked to him about changing MS medications and he thinks he wants to. Having more memory problems. Continuing on betaseron right now.   PRIOR HPI (Dr. 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 with a history of multiple sclerosis diagnosed in September 1996. He has been on Betaseron since 1996 and has right leg spasticity requiring him to use a cane in his left hand.He does not have any significant side effects from the Betaseron. Blood studies 02/10/2012 are normal CBC and CMP except for a low platelet count of 63K, Hgb 12.1 and alk Phos of 253. This is being followed by Dr. Chilton Si and Dr. Arbutus Ped. He has never had lymphopenia or neutropenia. There is no change in his symptoms. He denies bowel or bladder incontinence, weakness, Lhermitte's sign, or double vision. He swims 3 times per week for 10 laps which is a little over  a quarter of a mile, claims he is winded afterwards. He has not fallen, uses a cane in his left hand. MRI of the brain and cervical spine with and without contrast enhancement 03/26/2010 showed multiple subcortical, periventricular, and brainstem white matter hyperintensities without enhancing lesions present. There were T1 black holes and atrophy present. The cervical MRI showed a large central disc protrusion at C5-6 and spinal cord hyperintensities at C4 and brain stem representing remote demyelinating plaques without enhancing lesions present. Ampyra was started November 2012 with improvement in gait. He was pleased with the addition of the drug and his response. Pt denies injection site problems however he says he is tired of shots. He has been on [employer] since 1996 and we have talked about whether changing to an oral agent should be considered. He had several falls with his right leg giving way on standing. He has right knee pain. He states his memory is worse. He denies numbness delusions or depression. 07/11/2012= (MMSE29/30. Clock drawing task4/4. Animal fluency test 17). His wife has noted more problems walking which the patient is hesitant to discuss. He does admit since calling me for a work in appointment , that he believes he is having more difficulty walking. He uses a walker at home and is noticeably slower. He uses the walker to get out of a bed that is  low and out of a chair. He notices shortness of breath on swimming. His swallowing is okay except for occasional difficulty with liquids. He has right knee pain when walking. Betaseron seems to help his knee pain, walking, and his eyes after an injection. He is hesitant to go off of the medication for 3 weeks. He has increased spasms in his right foot and leg that are also moving to the left leg. He has swelling in his legs treated with furosemide.    REVIEW OF SYSTEMS: Full 14 system review of systems performed and negative except: as per HPI.     ALLERGIES: Allergies  Allergen Reactions   Farxiga [Dapagliflozin] Other (See Comments)    B/P dropped too low and extreme vertigo    HOME MEDICATIONS: Outpatient Medications Prior to Visit  Medication Sig Dispense Refill   acetaminophen (TYLENOL) 325 MG tablet Take 2 tablets (650 mg total) by mouth every 6 (six) hours as needed for mild pain (or Fever >/= 101).     amLODipine-valsartan (EXFORGE) 5-320 MG tablet Take 1 tablet by mouth daily.     apixaban (ELIQUIS) 5 MG TABS tablet Take 1 tablet (5 mg total) by mouth 2 (two) times daily. 60 tablet 11   Cholecalciferol (VITAMIN D3) 2000 UNITS TABS Take 2,000 Units by mouth daily.     COPAXONE 40 MG/ML SOSY INJECT ONE SYRINGE SUBCUTANEOUSLY 3 TIMES A WEEK AT LEAST 48 HOURS APART. ALLOW TO WARM TO ROOM TEMP FOR 20 MINUTES. REFRIGERATE. 36 mL 2   ferrous sulfate 325 (65 FE) MG tablet Take 325 mg by mouth daily with breakfast.     folic acid (FOLVITE) 1 MG tablet Take 1 mg by mouth daily.     furosemide (LASIX) 40 MG tablet Take 1 tablet (40 mg total) by mouth daily as needed for edema or fluid. 60 tablet 0   metoprolol succinate (TOPROL-XL) 25 MG 24 hr tablet Take 25 mg by mouth daily.     Multiple Vitamins-Minerals (MULTIVITAMIN MEN) TABS Take 1 tablet by mouth daily.     QUEtiapine (SEROQUEL) 25 MG tablet Take 25-50 mg by mouth at bedtime.     baclofen (LIORESAL) 10 MG tablet Take 0.5 tablets (5 mg total) by mouth 3 (three) times daily as needed for muscle spasms. 2nd prn 30 each 0   dalfampridine 10 MG TB12 TAKE 1 TABLET BY MOUTH 2 TIMES A DAY (APPROXIMATELY 12 HOURS APART). MAY BE TAKEN WITH OR WITHOUT FOOD. DO NOT CUT OR CRUSH. STORE AT ROOM T (Patient taking differently: Take 10 mg by mouth 2 (two) times daily. Approximately 12 hours apart. May be taken with or without food. Do not crush and store at room temp.) 180 tablet 2   losartan (COZAAR) 100 MG tablet Take 100 mg by mouth daily. (Patient not taking: Reported on 03/02/2023)      No facility-administered medications prior to visit.    PAST MEDICAL HISTORY: Past Medical History:  Diagnosis Date   Anemia    Aneurysm of aortic arch (HCC) 04/04/2012   Chronic aneurysmal dilatation of aortic arch with chronic type A aortic dissection, s/p replacement of ascending thoracic aorta   Aortic dissection (HCC) 11/05/1998   S/P emergency repair of acute type A aortic dissection with resuspension of native aortic valve   Aortic dissection, thoracic (HCC)    Aortic root aneurysm (HCC)    Atrial fibrillation (HCC) 01/30/2013   Atrial fibrillation    Bilateral lower extremity edema    BPH (benign prostatic  hyperplasia)    CHF (congestive heart failure) (HCC) 09-24-46   2D Echo - EF 50-55%, mild-moderately dilated right ventricle, mild-moderate tricuspid valve regurgitation, moderately dilated right atrium   Depression    Dyslipidemia    Exertional shortness of breath    "for awhile now" (09/11/2013)   Hemorrhoids    History of blood transfusion    Hypertension    Mitral regurgitation 02/20/2013   Multiple sclerosis (HCC)    Neuromuscular disorder (HCC)    Pleural effusion, right, large 01/30/2013   Severe tricuspid valve regurgitation 01/30/2013   Severe tricuspid regurg by recent 2-D echo with a dilated tricuspid annulus, moderate pulmonary hypertension and biatrial enlargement    Thrombocytopenia (HCC)     PAST SURGICAL HISTORY: Past Surgical History:  Procedure Laterality Date   HEMORRHOID SURGERY  2009   Internal, external hemorrhoidectomy, general anesthesia,prone position. [Other]   Open reduction and internal fixation of right distal radius fracture using [employer] distal radius volar locking plate.  07/2005   Dr Magnus Ivan   PACEMAKER IMPLANT N/A 09/28/2022   Procedure: PACEMAKER IMPLANT;  Surgeon: Marinus Maw, MD;  Location[facility] INVASIVE CV LAB;  Service: Cardiovascular;  Laterality: N/A;   Repair of acute type A aortic dissection with resuspension of  native aortic valve  10/15/1998   Dr Cornelius Moras   TEE WITHOUT CARDIOVERSION N/A 02/17/2013   Procedure: TRANSESOPHAGEAL ECHOCARDIOGRAM (TEE);  Surgeon: Thurmon Fair, MD;  Location: [facility] ENDOSCOPY;  Service: Cardiovascular;  Laterality: N/A;   TEMPORARY PACEMAKER N/A 09/26/2022   Procedure: TEMPORARY PACEMAKER;  Surgeon: Tonny Bollman, MD;  Location: [facility] INVASIVE CV LAB;  Service: Cardiovascular;  Laterality: N/A;    FAMILY HISTORY: Family History  Problem Relation Age of Onset   Thyroid cancer Father        smoked   Heart attack Mother    Lung cancer Brother        smoked   Emphysema Brother        smoked    SOCIAL HISTORY: Social History   Socioeconomic History   Marital status: Married    Spouse name: Debbie   Number of children: 2   Years of education: Ph.D   Highest education level: Not on file  Occupational History   Occupation: Retired Professor    Associate Professor: [employer]    Comment: Statistics  Tobacco Use   Smoking status: Never   Smokeless tobacco: Never  Vaping Use   Vaping Use: Never used  Substance and Sexual Activity   Alcohol use: Yes    Comment: 09/11/2013 "might have a drink once/month"   Drug use: No   Sexual activity: Not Currently  Other Topics Concern   Not on file  Social History Narrative   Patient lives at home with spouse.    Caffeine Use: eat a lot of chocolate, sodas, coffee and tea occasionally   Right Handed   Social Determinants of Health   Financial Resource Strain: Low Risk  (07/07/2018)   Overall Financial Resource Strain (CARDIA)    Difficulty of Paying Living Expenses: Not hard at all  Food Insecurity: No Food Insecurity (09/30/2022)   Hunger Vital Sign    Worried About Running Out of Food in the Last Year: Never true    Ran Out of Food in the Last Year: Never true  Transportation Needs: No Transportation Needs (09/30/2022)   PRAPARE - Administrator, Civil Service (Medical): No    Lack of Transportation  (Non-Medical): No  Physical Activity: Not on file  Stress: Not on file  Social Connections: Not on file  Intimate Partner Violence: Not At Risk (09/30/2022)   Humiliation, Afraid, Rape, and Kick questionnaire    Fear of Current or Ex-Partner: No    Emotionally Abused: No    Physically Abused: No    Sexually Abused: No     PHYSICAL EXAM  Vitals:   03/02/23 1542  BP: (!) 167/83  Pulse: 83  Weight: 170 lb (77.1 kg)  Height: 5\' 8"  (1.727 m)   Body mass index is 25.85 kg/m.  Generalized: Well developed, in no acute distress  Neck: Supple, no carotid bruits  Cardiac: IRREG RATE RHYTHM, SYS MURMUR  Neurological examination   MENTAL STATUS: awake, alert, language fluent, comprehension intact, naming intact   CRANIAL NERVE: pupils equal and reactive to light, visual fields full to confrontation, extraocular muscles: SACCADIC BREAKDOWN OF SMOOTH PURSUIT; END GAZE NYSTAGMUS; PAST POINTING ON SACCADES. Facial sensation and WITH DECR RIGHT EYE CLOSURE WEAKNESS AND RIGHT LOWER FACIAL STRENGTH. SYNKINESES ON RIGHT FACE. Uvula midline, shoulder shrug symmetric, tongue midline.   MOTOR: INCREASED TONE IN BLE > BUE (RIGHT > LEFT). BUE (DELTOID 4, TRICEP 4, BICEP 5, GRIP 4+), RIGHT LEG (HF 2, KE 3, KF 2, DF 1-2), LEFT LEG (HF 3, KE/KF 3, DF 3)  SENSORY: DECR IN RLE TO ALL MODALITIES.   COORDINATION: finger-nose-finger, fine finger movements normal   REFLEXES: BUE 3 (R>L), RLE 3, LLE 2.   GAIT/STATION: using rollator   DIAGNOSTIC DATA (LABS, IMAGING, TESTING) - I reviewed patient records, labs, notes, testing and imaging myself where available.  Lab Results  Component Value Date   WBC 5.2 10/02/2022   HGB 9.7 (L) 10/02/2022   HCT 30.2 (L) 10/02/2022   MCV 89.9 10/02/2022   PLT 144 (L) 10/02/2022      Latest Ref Rng & Units 10/02/2022    1:27 AM 10/01/2022    3:11 AM 09/30/2022    6:17 AM  CMP  Glucose 70 - 99 mg/dL 95  161  95   BUN 8 - 23 mg/dL 63  60  58   Creatinine 0.61 -  1.24 mg/dL 0.96  0.45  4.09   Sodium 135 - 145 mmol/L 141  141  141   Potassium 3.5 - 5.1 mmol/L 5.0  4.7  4.1   Chloride 98 - 111 mmol/L 113  114  112   CO2 22 - 32 mmol/L 19  22  21    Calcium 8.9 - 10.3 mg/dL 9.1  9.6  9.1    lymph#  Date Value Ref Range Status  02/10/2012 0.9 0.9 - 3.3 10e3/uL Final  08/10/2011 1.1 0.9 - 3.3 10e3/uL Final  03/12/2010 0.8 (L) 0.9 - 3.3 10e3/uL Final   Lymphocytes Absolute  Date Value Ref Range Status  09/23/2020 0.5 (L) 0.7 - 3.1 x10E3/uL Final  04/18/2019 0.3 (L) 0.7 - 3.1 x10E3/uL Final  01/02/2019 0.4 (L) 0.7 - 3.1 x10E3/uL Final   Lymphs Abs  Date Value Ref Range Status  10/02/2022 0.4 (L) 0.7 - 4.0 K/uL Final  10/01/2022 0.5 (L) 0.7 - 4.0 K/uL Final  09/30/2022 0.5 (L) 0.7 - 4.0 K/uL Final    11/16/12 MRI brain - There are multiple periventricular and subcortical and pontine chronic demyelinating plaques. There are several T1 black holes. Mild diffuse atrophy. No acute plaques. In comparison to MRI from 03/26/10, there is no significant change.   08/02/13 MRI brain - multiple brainstem, periventricular, corpus callosal  and subcortical white matter hyperintensities compatible with chronic lesions of multiple sclerosis. No enhancing lesions are noted. The presence of T1 black holes and significant atrophy of corpus callosum and cortex indicates chronic disease.  11/16/12 MRI cervical spine - Multiple chronic demyelinating plaques at C2-3, C3-4 and C5-6. No acute plaques. At C5-6: Disc bulging with facet hypertrophy with moderate spinal stenosis, severe right and moderate left foraminal stenosis. At C3-4, C4-5: Disc bulging with mild spinal stenosis and biforaminal stenosis. In comparison to MRI from 03/26/10 there is no significant change.   01/20/13 JCV antibody - positive, index 3.37 (H)   09/22/18 MRI of the brain with and without contrast shows multiple T2/flair hyperintense foci in the brainstem, cerebellum and hemispheres in a pattern and  configuration consistent with chronic demyelinating plaque associated with multiple sclerosis. None of the foci appears to be acute. There are no acute findings and there is a normal enhancement pattern. There are no definite new lesions compared to the 2016 MRI.    ASSESSMENT AND PLAN  77 y.o. old male with Hypertension; Anemia; Depression; Multiple sclerosis; Thrombocytopenia; Dyslipidemia; Aortic dissection, thoracic; Aortic dissection (11/05/1998); Aneurysm of aortic arch (04/04/2012); Chest pain (11/18/2009); CHF (congestive heart failure), Atrial fibrillation (01/30/2013); Pleural effusion, right, large (01/30/2013); Severe tricuspid valve regurgitation (01/30/2013); Mitral regurgitation (02/20/2013); and Lower extremity edema (02/20/2013) here with multiple sclerosis since 1996. Initially on betaseron, then tecfidera, now copaxone.   Dx:  1. MS (multiple sclerosis) (HCC)   2. Abnormality of gait and mobility      PLAN:  MULTIPLE SCLEROSIS (secondary progressive) - continue copaxone - continue ampyra - continue baclofen 10mg  at bedtime - continue swimming and PT exercises - high fall risk and on anti-coagulation (eliquis) for atrial fibrillation; precautions reviewed with patient  Meds ordered this encounter  Medications   dalfampridine 10 MG TB12    Sig: Take 1 tablet (10 mg total) by mouth 2 (two) times daily. Approximately 12 hours apart. May be taken with or without food. Do not crush and store at room temp.    Dispense:  60 tablet    Refill:  12   baclofen (LIORESAL) 10 MG tablet    Sig: Take 0.5 tablets (5 mg total) by mouth at bedtime. 2nd prn    Dispense:  30 each    Refill:  6   Return in about 1 year (around 03/01/2024) for with NP.     Suanne Marker, MD 03/02/2023, 4:59 PM Certified in Neurology, Neurophysiology and Neuroimaging  [facility] [address] [place], [place] [address] [phone]

## 2023-03-25 ENCOUNTER — Encounter: Payer: Self-pay | Admitting: Neurology

## 2023-03-25 NOTE — Telephone Encounter (Signed)
Received a call from [facility], pharmacist with the specialty pharmacy. They are wanting to confirm whether the patient has tried and failed generic copaxone and whether he has a contraindication to the generic. I reviewed the patient's chart and don't see where he has tried generic. I advised I would reach out to the pt and find out.  She would like a call back at [phone]. Either way a PA will be needed for generic or brand name.

## 2023-03-29 NOTE — Telephone Encounter (Signed)
I went ahead and attempted the PA for brand name to see if it will allow to go through, if not, then we will attempt generic PA.   PA on [facility]- KEY:  [id number]  Received a response "Please reach out to the Pharmacy Help Desk for further assistance [phone]"

## 2023-03-31 ENCOUNTER — Ambulatory Visit (INDEPENDENT_AMBULATORY_CARE_PROVIDER_SITE_OTHER): Payer: Medicare Other

## 2023-03-31 DIAGNOSIS — I442 Atrioventricular block, complete: Secondary | ICD-10-CM

## 2023-04-01 LAB — CUP PACEART REMOTE DEVICE CHECK
Battery Remaining Percentage: 95 %
Brady Statistic RV Percent Paced: 99 %
Date Time Interrogation Session: 20240717073055
Implantable Lead Connection Status: 753985
Implantable Lead Implant Date: 20240115
Implantable Lead Location: 753860
Implantable Lead Model: 377
Implantable Lead Serial Number: 8001096875
Implantable Pulse Generator Implant Date: 20240115
Lead Channel Impedance Value: 566 Ohm
Lead Channel Pacing Threshold Amplitude: 1.2 V
Lead Channel Pacing Threshold Pulse Width: 0.4 ms
Lead Channel Sensing Intrinsic Amplitude: 8.3 mV
Lead Channel Setting Pacing Amplitude: 2.6 V
Lead Channel Setting Pacing Pulse Width: 0.4 ms
Pulse Gen Model: 407157
Pulse Gen Serial Number: 70421235

## 2023-04-01 NOTE — Telephone Encounter (Signed)
PA approved through [facility] 03/31/2023-03/30/2024

## 2023-04-01 NOTE — Telephone Encounter (Signed)
Received a paper PA to be completed for brand name Copaxone for the pt. Completed and MD signed.  Faxed to [facility] and will await response/determination

## 2023-04-05 ENCOUNTER — Other Ambulatory Visit: Payer: Self-pay | Admitting: Diagnostic Neuroimaging

## 2023-04-07 ENCOUNTER — Telehealth: Payer: Self-pay | Admitting: Diagnostic Neuroimaging

## 2023-04-07 MED ORDER — COPAXONE 40 MG/ML ~~LOC~~ SOSY: PREFILLED_SYRINGE | SUBCUTANEOUS | 2 refills | Status: DC

## 2023-04-07 NOTE — Telephone Encounter (Signed)
New script was sent to the specialty pharmacy for the pt.

## 2023-04-07 NOTE — Telephone Encounter (Signed)
[facility] - [place], [place] - [address]  is asking for a new script for the COPAXONE 40 MG/ML SOSY

## 2023-04-15 NOTE — Progress Notes (Signed)
Remote pacemaker transmission.   

## 2023-05-20 ENCOUNTER — Ambulatory Visit: Payer: Medicare Other | Attending: Diagnostic Neuroimaging | Admitting: Physical Therapy

## 2023-05-20 ENCOUNTER — Ambulatory Visit: Payer: Medicare Other

## 2023-05-20 DIAGNOSIS — R269 Unspecified abnormalities of gait and mobility: Secondary | ICD-10-CM | POA: Insufficient documentation

## 2023-05-20 DIAGNOSIS — M6281 Muscle weakness (generalized): Secondary | ICD-10-CM | POA: Insufficient documentation

## 2023-05-20 DIAGNOSIS — R2681 Unsteadiness on feet: Secondary | ICD-10-CM | POA: Insufficient documentation

## 2023-05-24 ENCOUNTER — Encounter: Payer: Self-pay | Admitting: Physical Therapy

## 2023-05-24 ENCOUNTER — Ambulatory Visit: Payer: Medicare Other | Admitting: Physical Therapy

## 2023-05-24 VITALS — BP 156/80

## 2023-05-24 DIAGNOSIS — R269 Unspecified abnormalities of gait and mobility: Secondary | ICD-10-CM

## 2023-05-24 DIAGNOSIS — M6281 Muscle weakness (generalized): Secondary | ICD-10-CM

## 2023-05-24 DIAGNOSIS — R2681 Unsteadiness on feet: Secondary | ICD-10-CM

## 2023-05-24 NOTE — Therapy (Unsigned)
OUTPATIENT PHYSICAL THERAPY NEURO EVALUATION   Patient Name: [name] MRN: [id number] DOB:1945-09-23, 77 y.o., male Today's Date: 05/24/2023   PCP: Dr. Lafe Garin Family Physician  REFERRING PROVIDER: Suanne Marker, MD  END OF SESSION:  PT End of Session - 05/24/23 1538     Visit Number 1    Authorization Type Medicare    PT Start Time 1537             Past Medical History:  Diagnosis Date   Anemia    Aneurysm of aortic arch (HCC) 04/04/2012   Chronic aneurysmal dilatation of aortic arch with chronic type A aortic dissection, s/p replacement of ascending thoracic aorta   Aortic dissection (HCC) 11/05/1998   S/P emergency repair of acute type A aortic dissection with resuspension of native aortic valve   Aortic dissection, thoracic (HCC)    Aortic root aneurysm (HCC)    Atrial fibrillation (HCC) 01/30/2013   Atrial fibrillation    Bilateral lower extremity edema    BPH (benign prostatic hyperplasia)    CHF (congestive heart failure) (HCC) 1945/12/24   2D Echo - EF 50-55%, mild-moderately dilated right ventricle, mild-moderate tricuspid valve regurgitation, moderately dilated right atrium   Depression    Dyslipidemia    Exertional shortness of breath    "for awhile now" (09/11/2013)   Hemorrhoids    History of blood transfusion    Hypertension    Mitral regurgitation 02/20/2013   Multiple sclerosis (HCC)    Neuromuscular disorder (HCC)    Pleural effusion, right, large 01/30/2013   Severe tricuspid valve regurgitation 01/30/2013   Severe tricuspid regurg by recent 2-D echo with a dilated tricuspid annulus, moderate pulmonary hypertension and biatrial enlargement    Thrombocytopenia (HCC)    Past Surgical History:  Procedure Laterality Date   HEMORRHOID SURGERY  2009   Internal, external hemorrhoidectomy, general anesthesia,prone position. [Other]   Open reduction and internal fixation of right distal radius fracture using [employer] distal radius  volar locking plate.  07/2005   Dr Magnus Ivan   PACEMAKER IMPLANT N/A 09/28/2022   Procedure: PACEMAKER IMPLANT;  Surgeon: Marinus Maw, MD;  Location: [facility] INVASIVE CV LAB;  Service: Cardiovascular;  Laterality: N/A;   Repair of acute type A aortic dissection with resuspension of native aortic valve  10/15/1998   Dr Cornelius Moras   TEE WITHOUT CARDIOVERSION N/A 02/17/2013   Procedure: TRANSESOPHAGEAL ECHOCARDIOGRAM (TEE);  Surgeon: Thurmon Fair, MD;  Location: [facility] ENDOSCOPY;  Service: Cardiovascular;  Laterality: N/A;   TEMPORARY PACEMAKER N/A 09/26/2022   Procedure: TEMPORARY PACEMAKER;  Surgeon: Tonny Bollman, MD;  Location: [facility] INVASIVE CV LAB;  Service: Cardiovascular;  Laterality: N/A;   Patient Active Problem List   Diagnosis Date Noted   Bradycardia 09/27/2022   Pacemaker 09/27/2022   Symptomatic bradycardia 09/26/2022   Paroxysmal atrial fibrillation (HCC) 09/26/2022   Essential hypertension 09/26/2022   Acute renal failure superimposed on stage 3b chronic kidney disease (HCC) 09/25/2022   Aortic root aneurysm (HCC)    H/O aortic dissection 02/13/2016   Aneurysm of aortic arch (HCC) 12/31/2014   Tricuspid regurgitation 10/08/2014   RVF (right ventricular failure) (HCC) 10/08/2014   Chronic diastolic CHF (congestive heart failure) (HCC) 10/09/2013   Sinus pause 09/16/2013   Anemia due to blood loss, acute in setting of supratheraputic INR 09/11/13 09/12/2013   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 09/12/2013   Acute renal insufficiency 09/12/2013   Anasarca 09/12/2013   Anemia of chronic disease 09/12/2013  Bilateral leg edema 09/11/2013   Acute right-sided congestive heart failure (HCC) 09/11/2013   Unstable gait 09/11/2013   Dissection of aorta, thoracic- surgery Feb 2000 02/20/2013   Mitral regurgitation 02/20/2013   Lower extremity edema 02/20/2013   Volume overload 01/30/2013   Severe tricuspid valve regurgitation 01/30/2013   Pleural effusion, right, large  01/30/2013   Multiple sclerosis (HCC) 01/13/2013   Thrombocytopenia (HCC) 07/26/2011   Aortic dissection- chronic abd dissection  11/05/1998    ONSET DATE: 02/23/2023 (referral date)  REFERRING DIAG: G35 (ICD-10-CM) - Multiple sclerosis  THERAPY DIAG:  No diagnosis found.  Rationale for Evaluation and Treatment: Rehabilitation  SUBJECTIVE:                                                                                                                                                                                             SUBJECTIVE STATEMENT: Patient arrives in transport chair. Patient was getting PT and OT at home and insurance decided patient needed to transition to home health. Patient has custom AFO on R side. Patient is currently walking short distances with rollator at home (up and down driveway a few times). Patient currently has a manual wheelchair at home. Patient is wanting to avoid using a power wheelchair. Patient denies any wounds. Patient has 24/7 wound care. Patient drives with hands only car. D  Pt accompanied by: self and caretaker  PERTINENT HISTORY: Pacemaker beginning of January placed, AKI, afib, bradycardia  PAIN:  Are you having pain? No  PRECAUTIONS: Fall and ICD/Pacemaker  RED FLAGS: None   WEIGHT BEARING RESTRICTIONS: No  FALLS: Has patient fallen in last 6 months? No  LIVING ENVIRONMENT: Lives with: lives with their spouse Lives in: House/apartment Stairs:  House has stairs but has hospital bed on first level Has following equipment at home: Dan Humphreys - 4 wheeled, Wheelchair (manual), shower chair, and Grab bars  PLOF: Requires 24/7 caretaker assistance since March 2024,   PATIENT GOALS: "My objective is to walk better and longer."  OBJECTIVE:   DIAGNOSTIC FINDINGS:  MR Brain WO Contrast 09/22/2018: IMPRESSION: This MRI of the brain with and without contrast shows multiple T2/flair hyperintense foci in the brainstem, cerebellum and  hemispheres in a pattern and configuration consistent with chronic demyelinating plaque associated with multiple sclerosis.  None of the foci appears to be acute.  There are no acute findings and there is a normal enhancement pattern.  There are no definite new lesions compared to the 2016 MRI.  COGNITION: Overall cognitive status: Impaired - Word recall particularly names    SENSATION: WFL  EDEMA:  Reports mild edema intermittently for swelling  in feet; elevates during the evening  MUSCLE TONE: Hypertonicity R > L  MUSCLE LENGTH: Hamstrings: Right *** deg; Left *** deg Maisie Fus test: Right *** deg; Left *** deg  DTRs:  {DTR SITE:24025}  POSTURE: {posture:25561}  LOWER EXTREMITY ROM:     Shoulder flexion ~110 degrees limitations bilaterally   Grossly reduced on RLE due to strength deficits, varies given tone  LOWER EXTREMITY MMT:    MMT Right Eval Left Eval  Hip flexion 2+/5 3+/5  Hip extension    Hip abduction    Hip adduction    Hip internal rotation    Hip external rotation    Knee flexion 2+/5 3+/5  Knee extension 2+/5 3+/5  Ankle dorsiflexion    Ankle plantarflexion    Ankle inversion    Ankle eversion    (Blank rows = not tested)  BED MOBILITY:  Requires modA for LE management when getting into bed; able to get out independently   TRANSFERS: Assistive device utilized: {Assistive devices:23999}  Sit to stand: {Levels of assistance:24026} Stand to sit: {Levels of assistance:24026} Chair to chair: {Levels of assistance:24026} Floor: {Levels of assistance:24026}  RAMP:  Level of Assistance: {Levels of assistance:24026} Assistive device utilized: {Assistive devices:23999} Ramp Comments: ***  CURB:  Level of Assistance: {Levels of assistance:24026} Assistive device utilized: {Assistive devices:23999} Curb Comments: ***  STAIRS: Level of Assistance: {Levels of assistance:24026} Stair Negotiation Technique: {Stair Technique:27161} with {Rail  Assistance:27162} Number of Stairs: ***  Height of Stairs: ***  Comments: ***  GAIT: Gait pattern: {gait characteristics:25376} Distance walked: *** Assistive device utilized: {Assistive devices:23999} Level of assistance: {Levels of assistance:24026} Comments: ***  FUNCTIONAL TESTS:  {Functional tests:24029}  PATIENT SURVEYS:  {rehab surveys:24030}  TODAY'S TREATMENT:                                                                                                                              DATE: ***    PATIENT EDUCATION: Education details: *** Person educated: {Person educated:25204} Education method: {Education Method:25205} Education comprehension: {Education Comprehension:25206}  HOME EXERCISE PROGRAM: ***  GOALS: Goals reviewed with patient? {yes/no:20286}  SHORT TERM GOALS: Target date: ***  *** Baseline: Goal status: INITIAL  2.  *** Baseline:  Goal status: INITIAL  3.  *** Baseline:  Goal status: INITIAL  4.  *** Baseline:  Goal status: INITIAL  5.  *** Baseline:  Goal status: INITIAL  6.  *** Baseline:  Goal status: INITIAL  LONG TERM GOALS: Target date: ***  *** Baseline:  Goal status: INITIAL  2.  *** Baseline:  Goal status: INITIAL  3.  *** Baseline:  Goal status: INITIAL  4.  *** Baseline:  Goal status: INITIAL  5.  *** Baseline:  Goal status: INITIAL  6.  *** Baseline:  Goal status: INITIAL  ASSESSMENT:  CLINICAL IMPRESSION: Patient is a *** y.o. *** who was seen today for physical therapy evaluation and treatment for ***.   OBJECTIVE IMPAIRMENTS: {opptimpairments:25111}.  ACTIVITY LIMITATIONS: {activitylimitations:27494}  PARTICIPATION LIMITATIONS: {participationrestrictions:25113}  PERSONAL FACTORS: {Personal factors:25162} are also affecting patient's functional outcome.   REHAB POTENTIAL: {rehabpotential:25112}  CLINICAL DECISION MAKING: {clinical decision making:25114}  EVALUATION COMPLEXITY:  {Evaluation complexity:25115}  PLAN:  PT FREQUENCY: {rehab frequency:25116}  PT DURATION: {rehab duration:25117}  PLANNED INTERVENTIONS: {rehab planned interventions:25118::"Therapeutic exercises","Therapeutic activity","Neuromuscular re-education","Balance training","Gait training","Patient/Family education","Self Care","Joint mobilization"}  PLAN FOR NEXT SESSION: ***   Carmelia Bake, PT 05/24/2023, 3:41 PM

## 2023-05-25 ENCOUNTER — Encounter: Payer: Self-pay | Admitting: Physical Therapy

## 2023-06-02 ENCOUNTER — Ambulatory Visit: Payer: Medicare Other | Admitting: Physical Therapy

## 2023-06-08 ENCOUNTER — Ambulatory Visit: Payer: Medicare Other | Admitting: Physical Therapy

## 2023-06-08 DIAGNOSIS — M6281 Muscle weakness (generalized): Secondary | ICD-10-CM | POA: Diagnosis not present

## 2023-06-08 DIAGNOSIS — R269 Unspecified abnormalities of gait and mobility: Secondary | ICD-10-CM

## 2023-06-08 DIAGNOSIS — R2681 Unsteadiness on feet: Secondary | ICD-10-CM

## 2023-06-08 NOTE — Therapy (Signed)
OUTPATIENT PHYSICAL THERAPY NEURO TREATMENT   Patient Name: [name] MRN: [id number] DOB:11/30/1945, 77 y.o., male Today's Date: 06/08/2023   PCP: Dr. Lafe Garin Family Physician  REFERRING PROVIDER: Suanne Marker, MD  END OF SESSION:  PT End of Session - 06/08/23 1447     Visit Number 2    Number of Visits 7    Date for PT Re-Evaluation 07/20/23    Authorization Type Medicare    PT Start Time 1445    PT Stop Time 1535    PT Time Calculation (min) 50 min    Equipment Utilized During Treatment Gait belt    Activity Tolerance Patient tolerated treatment well    Behavior During Therapy WFL for tasks assessed/performed             Past Medical History:  Diagnosis Date   Anemia    Aneurysm of aortic arch (HCC) 04/04/2012   Chronic aneurysmal dilatation of aortic arch with chronic type A aortic dissection, s/p replacement of ascending thoracic aorta   Aortic dissection (HCC) 11/05/1998   S/P emergency repair of acute type A aortic dissection with resuspension of native aortic valve   Aortic dissection, thoracic (HCC)    Aortic root aneurysm (HCC)    Atrial fibrillation (HCC) 01/30/2013   Atrial fibrillation    Bilateral lower extremity edema    BPH (benign prostatic hyperplasia)    CHF (congestive heart failure) (HCC) 09/29/46   2D Echo - EF 50-55%, mild-moderately dilated right ventricle, mild-moderate tricuspid valve regurgitation, moderately dilated right atrium   Depression    Dyslipidemia    Exertional shortness of breath    "for awhile now" (09/11/2013)   Hemorrhoids    History of blood transfusion    Hypertension    Mitral regurgitation 02/20/2013   Multiple sclerosis (HCC)    Neuromuscular disorder (HCC)    Pleural effusion, right, large 01/30/2013   Severe tricuspid valve regurgitation 01/30/2013   Severe tricuspid regurg by recent 2-D echo with a dilated tricuspid annulus, moderate pulmonary hypertension and biatrial enlargement     Thrombocytopenia (HCC)    Past Surgical History:  Procedure Laterality Date   HEMORRHOID SURGERY  2009   Internal, external hemorrhoidectomy, general anesthesia,prone position. [Other]   Open reduction and internal fixation of right distal radius fracture using [employer] distal radius volar locking plate.  07/2005   Dr Magnus Ivan   PACEMAKER IMPLANT N/A 09/28/2022   Procedure: PACEMAKER IMPLANT;  Surgeon: Marinus Maw, MD;  Location: [facility] INVASIVE CV LAB;  Service: Cardiovascular;  Laterality: N/A;   Repair of acute type A aortic dissection with resuspension of native aortic valve  10/15/1998   Dr Cornelius Moras   TEE WITHOUT CARDIOVERSION N/A 02/17/2013   Procedure: TRANSESOPHAGEAL ECHOCARDIOGRAM (TEE);  Surgeon: Thurmon Fair, MD;  Location: [facility] ENDOSCOPY;  Service: Cardiovascular;  Laterality: N/A;   TEMPORARY PACEMAKER N/A 09/26/2022   Procedure: TEMPORARY PACEMAKER;  Surgeon: Tonny Bollman, MD;  Location: [facility] INVASIVE CV LAB;  Service: Cardiovascular;  Laterality: N/A;   Patient Active Problem List   Diagnosis Date Noted   Bradycardia 09/27/2022   Pacemaker 09/27/2022   Symptomatic bradycardia 09/26/2022   Paroxysmal atrial fibrillation (HCC) 09/26/2022   Essential hypertension 09/26/2022   Acute renal failure superimposed on stage 3b chronic kidney disease (HCC) 09/25/2022   Aortic root aneurysm (HCC)    H/O aortic dissection 02/13/2016   Aneurysm of aortic arch (HCC) 12/31/2014   Tricuspid regurgitation 10/08/2014   RVF (right ventricular failure) (HCC) 10/08/2014  Chronic diastolic CHF (congestive heart failure) (HCC) 10/09/2013   Sinus pause 09/16/2013   Anemia due to blood loss, acute in setting of supratheraputic INR 09/11/13 09/12/2013   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 09/12/2013   Acute renal insufficiency 09/12/2013   Anasarca 09/12/2013   Anemia of chronic disease 09/12/2013   Bilateral leg edema 09/11/2013   Acute right-sided congestive heart failure  (HCC) 09/11/2013   Unstable gait 09/11/2013   Dissection of aorta, thoracic- surgery Feb 2000 02/20/2013   Mitral regurgitation 02/20/2013   Lower extremity edema 02/20/2013   Volume overload 01/30/2013   Severe tricuspid valve regurgitation 01/30/2013   Pleural effusion, right, large 01/30/2013   Multiple sclerosis (HCC) 01/13/2013   Thrombocytopenia (HCC) 07/26/2011   Aortic dissection- chronic abd dissection  11/05/1998    ONSET DATE: 02/23/2023 (referral date)  REFERRING DIAG: G35 (ICD-10-CM) - Multiple sclerosis  THERAPY DIAG:  Abnormality of gait and mobility  Muscle weakness (generalized)  Unsteadiness on feet  Rationale for Evaluation and Treatment: Rehabilitation  SUBJECTIVE:                                                                                                                                                                                             SUBJECTIVE STATEMENT: Pt declines any acute changes since last visit, no falls today. Pt tends to use his rollator for the most part at home, uses the RW to get in/out of the bathroom due to size.  Pt accompanied by: self and caretaker Octavia  PERTINENT HISTORY: Pacemaker beginning of January placed, AKI, afib, bradycardia  PAIN:  Are you having pain? No  PRECAUTIONS: Fall and ICD/Pacemaker  RED FLAGS: None   WEIGHT BEARING RESTRICTIONS: No  FALLS: Has patient fallen in last 6 months? No  LIVING ENVIRONMENT: Lives with: lives with their spouse Lives in: House/apartment Stairs:  House has stairs but has hospital bed on first level Has following equipment at home: Dan Humphreys - 4 wheeled, Wheelchair (manual), shower chair, and Grab bars  PLOF: Requires 24/7 caretaker assistance since March 2024,   PATIENT GOALS: "My objective is to walk better and longer."  OBJECTIVE:   DIAGNOSTIC FINDINGS:   MR Brain WO Contrast 09/22/2018: IMPRESSION: This MRI of the brain with and without contrast shows multiple  T2/flair hyperintense foci in the brainstem, cerebellum and hemispheres in a pattern and configuration consistent with chronic demyelinating plaque associated with multiple sclerosis.  None of the foci appears to be acute.  There are no acute findings and there is a normal enhancement pattern.  There are no definite new lesions compared to the 2016 MRI.  COGNITION: Overall cognitive status: Impaired - Word recall particularly names    SENSATION: WFL  EDEMA:  Reports mild edema intermittently for swelling in feet; elevates during the evening  MUSCLE TONE: Hypertonicity R > L  POSTURE: posterior pelvic tilt, flexed trunk , and scoliosis   LOWER EXTREMITY ROM:     Shoulder flexion ~110 degrees limitations bilaterally   Grossly reduced on RLE due to strength deficits, varies given tone  LOWER EXTREMITY MMT:    MMT Right Eval Left Eval  Hip flexion 2+/5 3+/5  Hip extension    Hip abduction    Hip adduction    Hip internal rotation    Hip external rotation    Knee flexion 2+/5 3+/5  Knee extension 2+/5 3+/5  Ankle dorsiflexion    Ankle plantarflexion    Ankle inversion    Ankle eversion    (Blank rows = not tested)  BED MOBILITY:  Requires modA for LE management when getting into bed; able to get out independently   TRANSFERS: Assistive device utilized: Environmental consultant - 4 wheeled and Wheelchair (manual)  Sit to stand: Mod A Stand to sit: Min A Chair to chair: Min A  GAIT: Gait pattern: decreased step length- Right, decreased step length- Left, decreased hip/knee flexion- Right, decreased hip/knee flexion- Left, decreased ankle dorsiflexion- Right, decreased ankle dorsiflexion- Left, trunk flexed, poor foot clearance- Right, and poor foot clearance- Left Significant out toeing/dragging of RLE Distance walked: 1 x 60 feet Assistive device utilized: Environmental consultant - 4 wheeled and Wheelchair (manual) Level of assistance: Min A Comments: Patient uses rollator at baseline; very unsafe with  navigation and stearing  FUNCTIONAL TESTS:    05/25/23 0001  Standardized Balance Assessment  Standardized Balance Assessment 10 meter walk test  10 Meter Walk 0.19 m/s (with rollator and transport chair follow (SBA))   PATIENT SURVEYS:  Not performed  TODAY'S TREATMENT:                                                                                                                              TherAct Bed mobility with use of leg lifter Bridges ***  TherEx  NMR  Gait Gait pattern: {gait characteristics:25376} Distance walked: 115 ft Assistive device utilized: Environmental consultant - 4 wheeled Level of assistance: Min A Comments: decreased RLE clearance, drags limb with hip ER  : 57 ft, RPE 6-7/10  PATIENT EDUCATION: Education details: Recommended use of 2WW versus rollator, examination findings, goal collaboration, POC*** Person educated: Patient and Software engineer Education method: Explanation Education comprehension: verbalized understanding and needs further education  HOME EXERCISE PROGRAM: Access Code: [email or web address] URL: [email or web address] Date: 06/08/2023 Prepared by: Peter Congo  Exercises - Supine Bridge  - 1 x daily - 7 x weekly - 3 sets - 10 reps  GOALS: Goals reviewed with patient? Yes  SHORT TERM GOALS: Target date: 06/15/2023  Patient will demonstrate 100% compliance with initial HEP to continue to progress between physical therapy sessions.  Baseline: To be provided Goal status: INITIAL  2.  Patient will improve gait speed to 0.3 m/s with LRAD or greater to indicate an improvement towards level of limited community ambulator.   Baseline: 0.19 m/s with rollator Goal status: INITIAL  3.  Patient will be able to perform sit to stand stand transfer from transport chair with minA to indicate improved LE strength. Baseline: modA with strong reliance on UE support Goal status: INITIAL  4.  to be assesed/LTG written Baseline: To be  assessed Goal status: INITIAL   LONG TERM GOALS: Target date: 07/06/2023  Patient will report demonstrate independence with final HEP in order to maintain current gains and continue to progress after physical therapy discharge.   Baseline: To be provided Goal status: INITIAL  2.  Patient will improve gait speed to 0.4 m/s or greater to indicate an improvement from being a household ambulator to limited community ambulator.   Baseline: 0.19 m/s with rollator Goal status: INITIAL  3.  Patient will be able to perform sit to stand stand transfer from transport chair with minA to indicate improved LE strength. Baseline: modA with strong reliance on UE support Goal status: INITIAL  4.  to be assesed/LTG written Baseline: To be assessed Goal status: INITIAL  ASSESSMENT:  CLINICAL IMPRESSION: Emphasis of skilled PT session*** Continue POC.   OBJECTIVE IMPAIRMENTS: Abnormal gait, decreased balance, decreased endurance, decreased knowledge of use of DME, decreased mobility, difficulty walking, decreased ROM, increased muscle spasms, impaired tone, and impaired UE functional use.   ACTIVITY LIMITATIONS: carrying, standing, squatting, transfers, dressing, reach over head, and locomotion level  PARTICIPATION LIMITATIONS: meal prep, laundry, community activity, and yard work  PERSONAL FACTORS: Age, Time since onset of injury/illness/exacerbation, and 1-2 comorbidities: see above  are also affecting patient's functional outcome.   REHAB POTENTIAL: Good  CLINICAL DECISION MAKING: Evolving/moderate complexity  EVALUATION COMPLEXITY: Moderate  PLAN:  PT FREQUENCY: 1x/week  PT DURATION: 6 weeks  PLANNED INTERVENTIONS: Therapeutic exercises, Therapeutic activity, Neuromuscular re-education, Balance training, Gait training, Patient/Family education, Self Care, Dry Needling, Manual therapy, and Re-evaluation  PLAN FOR NEXT SESSION: assess and write LTG, create HEP with emphasis  on mobility/strength/balance as able, review use of 2WW versus rollator***   Peter Congo, PT, DPT, CSRS  06/08/2023, 3:36 PM

## 2023-06-09 ENCOUNTER — Telehealth: Payer: Self-pay | Admitting: Physical Therapy

## 2023-06-09 DIAGNOSIS — M6281 Muscle weakness (generalized): Secondary | ICD-10-CM

## 2023-06-09 NOTE — Telephone Encounter (Signed)
Dr. Marjory Lies,  Mr. Cordario Braude is being seen by Physical Therapy for his MS. The patient would benefit from an Occupational Therapy evaluation for decreased RUE strength and ROM.    If you agree, please place an order in [facility] workque in [facility] or fax the order to [phone]. Thank you, Peter Congo, PT, DPT, [facility] [address] [place], [place]  [address] Phone:  [phone] Fax:  [phone]

## 2023-06-11 NOTE — Telephone Encounter (Signed)
Orders Placed This Encounter  Procedures   Ambulatory referral to Occupational Therapy    Suanne Marker, MD 06/11/2023, 12:35 PM Certified in Neurology, Neurophysiology and Neuroimaging  [facility] [address], [address] [place], [place] [address] [phone]

## 2023-06-14 ENCOUNTER — Ambulatory Visit: Payer: Medicare Other | Admitting: Physical Therapy

## 2023-06-14 ENCOUNTER — Encounter: Payer: Self-pay | Admitting: Physical Therapy

## 2023-06-14 DIAGNOSIS — R269 Unspecified abnormalities of gait and mobility: Secondary | ICD-10-CM

## 2023-06-14 DIAGNOSIS — M6281 Muscle weakness (generalized): Secondary | ICD-10-CM | POA: Diagnosis not present

## 2023-06-14 DIAGNOSIS — R2681 Unsteadiness on feet: Secondary | ICD-10-CM

## 2023-06-14 NOTE — Therapy (Signed)
OUTPATIENT PHYSICAL THERAPY NEURO TREATMENT   Patient Name: [name] MRN: [id number] DOB:1946/01/09, 77 y.o., male Today's Date: 06/14/2023   PCP: Dr. Lafe Garin Family Physician  REFERRING PROVIDER: Suanne Marker, MD  END OF SESSION:  PT End of Session - 06/14/23 1535     Visit Number 3    Number of Visits 7    Date for PT Re-Evaluation 07/20/23    Authorization Type Medicare    PT Start Time 1532    PT Stop Time 1615    PT Time Calculation (min) 43 min    Equipment Utilized During Treatment Gait belt    Activity Tolerance Patient tolerated treatment well    Behavior During Therapy WFL for tasks assessed/performed             Past Medical History:  Diagnosis Date   Anemia    Aneurysm of aortic arch (HCC) 04/04/2012   Chronic aneurysmal dilatation of aortic arch with chronic type A aortic dissection, s/p replacement of ascending thoracic aorta   Aortic dissection (HCC) 11/05/1998   S/P emergency repair of acute type A aortic dissection with resuspension of native aortic valve   Aortic dissection, thoracic (HCC)    Aortic root aneurysm (HCC)    Atrial fibrillation (HCC) 01/30/2013   Atrial fibrillation    Bilateral lower extremity edema    BPH (benign prostatic hyperplasia)    CHF (congestive heart failure) (HCC) Jan 12, 1946   2D Echo - EF 50-55%, mild-moderately dilated right ventricle, mild-moderate tricuspid valve regurgitation, moderately dilated right atrium   Depression    Dyslipidemia    Exertional shortness of breath    "for awhile now" (09/11/2013)   Hemorrhoids    History of blood transfusion    Hypertension    Mitral regurgitation 02/20/2013   Multiple sclerosis (HCC)    Neuromuscular disorder (HCC)    Pleural effusion, right, large 01/30/2013   Severe tricuspid valve regurgitation 01/30/2013   Severe tricuspid regurg by recent 2-D echo with a dilated tricuspid annulus, moderate pulmonary hypertension and biatrial enlargement     Thrombocytopenia (HCC)    Past Surgical History:  Procedure Laterality Date   HEMORRHOID SURGERY  2009   Internal, external hemorrhoidectomy, general anesthesia,prone position. [Other]   Open reduction and internal fixation of right distal radius fracture using [employer] distal radius volar locking plate.  07/2005   Dr Magnus Ivan   PACEMAKER IMPLANT N/A 09/28/2022   Procedure: PACEMAKER IMPLANT;  Surgeon: Marinus Maw, MD;  Location: [facility] INVASIVE CV LAB;  Service: Cardiovascular;  Laterality: N/A;   Repair of acute type A aortic dissection with resuspension of native aortic valve  10/15/1998   Dr Cornelius Moras   TEE WITHOUT CARDIOVERSION N/A 02/17/2013   Procedure: TRANSESOPHAGEAL ECHOCARDIOGRAM (TEE);  Surgeon: Thurmon Fair, MD;  Location: [facility] ENDOSCOPY;  Service: Cardiovascular;  Laterality: N/A;   TEMPORARY PACEMAKER N/A 09/26/2022   Procedure: TEMPORARY PACEMAKER;  Surgeon: Tonny Bollman, MD;  Location: [facility] INVASIVE CV LAB;  Service: Cardiovascular;  Laterality: N/A;   Patient Active Problem List   Diagnosis Date Noted   Bradycardia 09/27/2022   Pacemaker 09/27/2022   Symptomatic bradycardia 09/26/2022   Paroxysmal atrial fibrillation (HCC) 09/26/2022   Essential hypertension 09/26/2022   Acute renal failure superimposed on stage 3b chronic kidney disease (HCC) 09/25/2022   Aortic root aneurysm (HCC)    H/O aortic dissection 02/13/2016   Aneurysm of aortic arch (HCC) 12/31/2014   Tricuspid regurgitation 10/08/2014   RVF (right ventricular failure) (HCC) 10/08/2014  Chronic diastolic CHF (congestive heart failure) (HCC) 10/09/2013   Sinus pause 09/16/2013   Anemia due to blood loss, acute in setting of supratheraputic INR 09/11/13 09/12/2013   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 09/12/2013   Acute renal insufficiency 09/12/2013   Anasarca 09/12/2013   Anemia of chronic disease 09/12/2013   Bilateral leg edema 09/11/2013   Acute right-sided congestive heart failure  (HCC) 09/11/2013   Unstable gait 09/11/2013   Dissection of aorta, thoracic- surgery Feb 2000 02/20/2013   Mitral regurgitation 02/20/2013   Lower extremity edema 02/20/2013   Volume overload 01/30/2013   Severe tricuspid valve regurgitation 01/30/2013   Pleural effusion, right, large 01/30/2013   Multiple sclerosis (HCC) 01/13/2013   Thrombocytopenia (HCC) 07/26/2011   Aortic dissection- chronic abd dissection  11/05/1998    ONSET DATE: 02/23/2023 (referral date)  REFERRING DIAG: G35 (ICD-10-CM) - Multiple sclerosis  THERAPY DIAG:  Muscle weakness (generalized)  Abnormality of gait and mobility  Unsteadiness on feet  Rationale for Evaluation and Treatment: Rehabilitation  SUBJECTIVE:                                                                                                                                                                                             SUBJECTIVE STATEMENT: Patient reports he is doing well. Denies falls and near falls. States he got the leg loops to try and get in and out of bed but they are not working very well.   Pt accompanied by: self and caretaker Lajoyce Corners (dropped off)  PERTINENT HISTORY: Pacemaker beginning of January placed, AKI, afib, bradycardia  PAIN:  Are you having pain? No  PRECAUTIONS: Fall and ICD/Pacemaker  RED FLAGS: None   WEIGHT BEARING RESTRICTIONS: No  FALLS: Has patient fallen in last 6 months? No  LIVING ENVIRONMENT: Lives with: lives with their spouse Lives in: House/apartment Stairs:  House has stairs but has hospital bed on first level Has following equipment at home: Dan Humphreys - 4 wheeled, Wheelchair (manual), shower chair, and Grab bars  PLOF: Requires 24/7 caretaker assistance since March 2024,   PATIENT GOALS: "My objective is to walk better and longer."  OBJECTIVE:   DIAGNOSTIC FINDINGS:   MR Brain WO Contrast 09/22/2018: IMPRESSION: This MRI of the brain with and without contrast shows multiple  T2/flair hyperintense foci in the brainstem, cerebellum and hemispheres in a pattern and configuration consistent with chronic demyelinating plaque associated with multiple sclerosis.  None of the foci appears to be acute.  There are no acute findings and there is a normal enhancement pattern.  There are no definite new lesions compared to the 2016 MRI.  COGNITION: Overall cognitive status: Impaired - Word recall particularly names   TODAY'S TREATMENT:                                                                                                                               TherAct Required modA to come to stand form transport chair and minA to come to stand from transport chair with use of // bars to come to stand  Between // bars with CGA and minA with fatigue Standing with single UE support with same side reaching and cone stacking x 6 cones Standing with singe UE support with opposite side reaching and cone stacking x 6  Encouraged patient to bring leg loops to next session as paitent reports the one he got are different and challenging to use  Gait Gait pattern: decreased step length- Right, decreased hip/knee flexion- Right, decreased ankle dorsiflexion- Right, abducted- Right, and poor foot clearance- Right Distance walked: 60 ft Assistive device utilized: Environmental consultant - 2 wheeled Level of assistance: Min A Comments: decreased RLE clearance, drags limb with hip ER, improved stability but increased fatigue due to exertion level progressing walker  PATIENT EDUCATION: Education details: schedule OT eval, 2WW for stability, continue HEP, progress use of 2WW Person educated: Patient Education method: Explanation, Tactile cues, Verbal cues, and Handouts Education comprehension: verbalized understanding and needs further education  HOME EXERCISE PROGRAM: Access Code: [email or web address] URL: [email or web address] Date: 06/08/2023 Prepared by: Peter Congo  Exercises - Supine  Bridge  - 1 x daily - 7 x weekly - 3 sets - 10 reps  GOALS: Goals reviewed with patient? Yes  SHORT TERM GOALS: Target date: 06/15/2023  Patient will demonstrate 100% compliance with initial HEP to continue to progress between physical therapy sessions.   Baseline: To be provided Goal status: INITIAL  2.  Patient will improve gait speed to 0.3 m/s with LRAD or greater to indicate an improvement towards level of limited community ambulator.   Baseline: 0.19 m/s with rollator Goal status: INITIAL  3.  Patient will be able to perform sit to stand stand transfer from transport chair with minA to indicate improved LE strength. Baseline: modA with strong reliance on UE support Goal status: INITIAL  4.  to be assesed/LTG written Baseline: assessed 06/08/23 Goal status: MET   LONG TERM GOALS: Target date: 07/06/2023  Patient will report demonstrate independence with final HEP in order to maintain current gains and continue to progress after physical therapy discharge.   Baseline: To be provided Goal status: INITIAL  2.  Patient will improve gait speed to 0.4 m/s or greater to indicate an improvement from being a household ambulator to limited community ambulator.   Baseline: 0.19 m/s with rollator Goal status: INITIAL  3.  Patient will be able to perform sit to stand stand transfer from transport chair with minA to indicate improved LE strength. Baseline: modA with strong reliance on UE support Goal status: INITIAL  4.  Pt will ambulate greater than or equal to 100 feet on with LRAD and CGA for improved cardiovascular endurance and BLE strength.  Baseline: 57 ft with RW min A (9/24) Goal status: INITIAL  ASSESSMENT:  CLINICAL IMPRESSION: Emphasis of skilled PT session on comparing gait with 2WW as compared to rollator used in prior session. Patient notably fatigued faster with 2WW but significantly more safe. Recommend ongoing trial to determine most appropriate AD.  Trialed lateral cross body reaching in // bars. Patient benefited from mirror for external visual feedback. Continue POC.  OBJECTIVE IMPAIRMENTS: Abnormal gait, decreased balance, decreased endurance, decreased knowledge of use of DME, decreased mobility, difficulty walking, decreased ROM, increased muscle spasms, impaired tone, and impaired UE functional use.   ACTIVITY LIMITATIONS: carrying, standing, squatting, transfers, dressing, reach over head, and locomotion level  PARTICIPATION LIMITATIONS: meal prep, laundry, community activity, and yard work  PERSONAL FACTORS: Age, Time since onset of injury/illness/exacerbation, and 1-2 comorbidities: see above  are also affecting patient's functional outcome.   REHAB POTENTIAL: Good  CLINICAL DECISION MAKING: Evolving/moderate complexity  EVALUATION COMPLEXITY: Moderate  PLAN:  PT FREQUENCY: 1x/week  PT DURATION: 6 weeks  PLANNED INTERVENTIONS: Therapeutic exercises, Therapeutic activity, Neuromuscular re-education, Balance training, Gait training, Patient/Family education, Self Care, Dry Needling, Manual therapy, and Re-evaluation  PLAN FOR NEXT SESSION: Add to HEP with emphasis on mobility/strength/balance as able, review use of 2WW versus rollator, work on increasing trunk ROM, breaking down steps of bed mobility, can he tolerate tall-kneel at bench?  Maryruth Eve, PT, DPT 06/14/2023, 5:15 PM

## 2023-06-21 ENCOUNTER — Ambulatory Visit: Payer: Medicare Other | Admitting: Physical Therapy

## 2023-06-21 VITALS — BP 185/82

## 2023-06-21 DIAGNOSIS — R29898 Other symptoms and signs involving the musculoskeletal system: Secondary | ICD-10-CM | POA: Insufficient documentation

## 2023-06-21 DIAGNOSIS — R2681 Unsteadiness on feet: Secondary | ICD-10-CM

## 2023-06-21 DIAGNOSIS — M6281 Muscle weakness (generalized): Secondary | ICD-10-CM | POA: Insufficient documentation

## 2023-06-21 DIAGNOSIS — R278 Other lack of coordination: Secondary | ICD-10-CM | POA: Insufficient documentation

## 2023-06-21 DIAGNOSIS — R269 Unspecified abnormalities of gait and mobility: Secondary | ICD-10-CM | POA: Insufficient documentation

## 2023-06-21 DIAGNOSIS — R29818 Other symptoms and signs involving the nervous system: Secondary | ICD-10-CM | POA: Insufficient documentation

## 2023-06-21 NOTE — Therapy (Signed)
[facility] [address] [place], [place], [address] Phone: [phone]   Fax:  [phone]  Patient Details  Name: [name] MRN: [id number] Date of Birth: 01-12-1946 Referring Provider:  Eloisa Northern, MD  Encounter Date: 06/21/2023  Session was arrive no charge. Patient BP was out of range. Patient asymptomatic. Encouraged patient to follow up with PCP and to go to ED to become symptomatic.   Vitals:   06/21/23 1547 06/21/23 1548  BP: (!) 182/87 (!) 185/82    Carmelia Bake, PT, DPT 06/21/2023, 4:04 PM  [facility] [address] [place], [place], [address] Phone: [phone]   Fax:  [phone]

## 2023-06-28 ENCOUNTER — Ambulatory Visit: Payer: Medicare Other | Attending: Diagnostic Neuroimaging | Admitting: Physical Therapy

## 2023-06-28 ENCOUNTER — Encounter: Payer: Self-pay | Admitting: Physical Therapy

## 2023-06-28 VITALS — BP 146/68 | HR 79

## 2023-06-28 DIAGNOSIS — R2681 Unsteadiness on feet: Secondary | ICD-10-CM

## 2023-06-28 DIAGNOSIS — R278 Other lack of coordination: Secondary | ICD-10-CM | POA: Diagnosis present

## 2023-06-28 DIAGNOSIS — R29818 Other symptoms and signs involving the nervous system: Secondary | ICD-10-CM | POA: Diagnosis present

## 2023-06-28 DIAGNOSIS — R269 Unspecified abnormalities of gait and mobility: Secondary | ICD-10-CM

## 2023-06-28 DIAGNOSIS — R29898 Other symptoms and signs involving the musculoskeletal system: Secondary | ICD-10-CM | POA: Diagnosis present

## 2023-06-28 DIAGNOSIS — M6281 Muscle weakness (generalized): Secondary | ICD-10-CM | POA: Diagnosis present

## 2023-06-28 NOTE — Therapy (Signed)
OUTPATIENT PHYSICAL THERAPY NEURO TREATMENT   Patient Name: [name] MRN: [id number] DOB:07-19-1946, 77 y.o., male Today's Date: 06/28/2023   PCP: Dr. Lafe Garin Family Physician  REFERRING PROVIDER: Suanne Marker, MD  END OF SESSION:  PT End of Session - 06/28/23 1517     Visit Number 4    Number of Visits 7    Date for PT Re-Evaluation 07/20/23    Authorization Type Medicare    PT Start Time 1510   Pt arrives and is agreeable to being seen early   PT Stop Time 1605    PT Time Calculation (min) 55 min    Equipment Utilized During Treatment Gait belt    Activity Tolerance Patient tolerated treatment well    Behavior During Therapy WFL for tasks assessed/performed             Past Medical History:  Diagnosis Date   Anemia    Aneurysm of aortic arch (HCC) 04/04/2012   Chronic aneurysmal dilatation of aortic arch with chronic type A aortic dissection, s/p replacement of ascending thoracic aorta   Aortic dissection (HCC) 11/05/1998   S/P emergency repair of acute type A aortic dissection with resuspension of native aortic valve   Aortic dissection, thoracic (HCC)    Aortic root aneurysm    Atrial fibrillation (HCC) 01/30/2013   Atrial fibrillation    Bilateral lower extremity edema    BPH (benign prostatic hyperplasia)    CHF (congestive heart failure) (HCC) 08/29/1947   2D Echo - EF 50-55%, mild-moderately dilated right ventricle, mild-moderate tricuspid valve regurgitation, moderately dilated right atrium   Depression    Dyslipidemia    Exertional shortness of breath    "for awhile now" (09/11/2013)   Hemorrhoids    History of blood transfusion    Hypertension    Mitral regurgitation 02/20/2013   Multiple sclerosis (HCC)    Neuromuscular disorder (HCC)    Pleural effusion, right, large 01/30/2013   Severe tricuspid valve regurgitation 01/30/2013   Severe tricuspid regurg by recent 2-D echo with a dilated tricuspid annulus, moderate pulmonary  hypertension and biatrial enlargement    Thrombocytopenia (HCC)    Past Surgical History:  Procedure Laterality Date   HEMORRHOID SURGERY  2009   Internal, external hemorrhoidectomy, general anesthesia,prone position. [Other]   Open reduction and internal fixation of right distal radius fracture using [employer] distal radius volar locking plate.  07/2005   Dr Magnus Ivan   PACEMAKER IMPLANT N/A 09/28/2022   Procedure: PACEMAKER IMPLANT;  Surgeon: Marinus Maw, MD;  Location: [facility] INVASIVE CV LAB;  Service: Cardiovascular;  Laterality: N/A;   Repair of acute type A aortic dissection with resuspension of native aortic valve  10/15/1998   Dr Cornelius Moras   TEE WITHOUT CARDIOVERSION N/A 02/17/2013   Procedure: TRANSESOPHAGEAL ECHOCARDIOGRAM (TEE);  Surgeon: Thurmon Fair, MD;  Location: [facility] ENDOSCOPY;  Service: Cardiovascular;  Laterality: N/A;   TEMPORARY PACEMAKER N/A 09/26/2022   Procedure: TEMPORARY PACEMAKER;  Surgeon: Tonny Bollman, MD;  Location: [facility] INVASIVE CV LAB;  Service: Cardiovascular;  Laterality: N/A;   Patient Active Problem List   Diagnosis Date Noted   Bradycardia 09/27/2022   Pacemaker 09/27/2022   Symptomatic bradycardia 09/26/2022   Paroxysmal atrial fibrillation (HCC) 09/26/2022   Essential hypertension 09/26/2022   Acute renal failure superimposed on stage 3b chronic kidney disease (HCC) 09/25/2022   Aortic root aneurysm    H/O aortic dissection 02/13/2016   Aneurysm of aortic arch (HCC) 12/31/2014   Tricuspid regurgitation 10/08/2014  RVF (right ventricular failure) (HCC) 10/08/2014   Chronic diastolic CHF (congestive heart failure) (HCC) 10/09/2013   Sinus pause 09/16/2013   Anemia due to blood loss, acute in setting of supratheraputic INR 09/11/13 09/12/2013   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 09/12/2013   Acute renal insufficiency 09/12/2013   Anasarca 09/12/2013   Anemia of chronic disease 09/12/2013   Bilateral leg edema 09/11/2013   Acute  right-sided congestive heart failure (HCC) 09/11/2013   Unstable gait 09/11/2013   Dissection of aorta, thoracic- surgery Feb 2000 02/20/2013   Mitral regurgitation 02/20/2013   Lower extremity edema 02/20/2013   Volume overload 01/30/2013   Severe tricuspid valve regurgitation 01/30/2013   Pleural effusion, right, large 01/30/2013   Multiple sclerosis (HCC) 01/13/2013   Thrombocytopenia (HCC) 07/26/2011   Aortic dissection- chronic abd dissection  11/05/1998    ONSET DATE: 02/23/2023 (referral date)  REFERRING DIAG: G35 (ICD-10-CM) - Multiple sclerosis  THERAPY DIAG:  Muscle weakness (generalized)  Abnormality of gait and mobility  Unsteadiness on feet  Rationale for Evaluation and Treatment: Rehabilitation  SUBJECTIVE:                                                                                                                                                                                             SUBJECTIVE STATEMENT: Patient reports he is doing well. Denies falls and near falls.  He is able to get out of bed on his on, but getting into bed remains difficult.  He did not bring leg loops today.  Pt accompanied by: self and caretaker Lajoyce Corners (dropped off)  PERTINENT HISTORY: Pacemaker beginning of January placed, AKI, afib, bradycardia  PAIN:  Are you having pain? No  PRECAUTIONS: Fall and ICD/Pacemaker  RED FLAGS: None   WEIGHT BEARING RESTRICTIONS: No  FALLS: Has patient fallen in last 6 months? No  LIVING ENVIRONMENT: Lives with: lives with their spouse Lives in: House/apartment Stairs:  House has stairs but has hospital bed on first level Has following equipment at home: Dan Humphreys - 4 wheeled, Wheelchair (manual), shower chair, and Grab bars  PLOF: Requires 24/7 caretaker assistance since March 2024,   PATIENT GOALS: "My objective is to walk better and longer."  OBJECTIVE:   DIAGNOSTIC FINDINGS:   MR Brain WO Contrast 09/22/2018: IMPRESSION: This  MRI of the brain with and without contrast shows multiple T2/flair hyperintense foci in the brainstem, cerebellum and hemispheres in a pattern and configuration consistent with chronic demyelinating plaque associated with multiple sclerosis.  None of the foci appears to be acute.  There are no acute findings and there is a normal enhancement pattern.  There are no definite new lesions compared to the 2016 MRI.  COGNITION: Overall cognitive status: Impaired - Word recall particularly names   TODAY'S TREATMENT:                                                                                                                               TherAct -Reprinted HEP per pt request.  Reviewed bridging 4x6 - last set used gait belt to hold RLE in place and improve pt independence, wrote this on paper for caregiver to assist at home.  -Bed mobility: sit <> supine CGA-minA w/ increased time completed twice - pt has most difficulty with log rolling, preferring to pull to sit from supine as he does at home; rolling left and right completed twice each, pt able to verbalize how he uses hospital bed features at home, more difficulty requiring minA to obtain full side-lying without bed rails and head elevation. - w/ RW CGA-minA for weight shift to improve RLE advancement as pt tends to drag RLE in ER, +2 transport chair follow:  53.97 sec = 0.61m/sec OR 0.61 ft/sec  PATIENT EDUCATION: Education details: Continue HEP, bring leg loops for further assessment and practice.  Progress towards STGs and discussion of having caregiver attend future session working on walking safety and RLE advancement. Person educated: Patient and Nurse, adult Education method: Explanation, Tactile cues, Verbal cues, and Handouts Education comprehension: verbalized understanding and needs further education  HOME EXERCISE PROGRAM: Access Code: [email or web address] URL: [email or web address] Date: 06/08/2023 Prepared by: Peter Congo  Exercises - Supine Bridge  - 1 x daily - 7 x weekly - 3 sets - 10 reps  GOALS: Goals reviewed with patient? Yes  SHORT TERM GOALS: Target date: 06/15/2023  Patient will demonstrate 100% compliance with initial HEP to continue to progress between physical therapy sessions.   Baseline: Introductory exercise, pt not doing this, reprinted (10/14) Goal status: NOT MET  2.  Patient will improve gait speed to 0.3 m/s with LRAD or greater to indicate an improvement towards level of limited community ambulator.   Baseline: 0.19 m/s with rollator; 0.77m/sec (10/14) Goal status: ONGOING  3.  Patient will be able to perform sit to stand stand transfer from transport chair with minA to indicate improved LE strength. Baseline: modA with strong reliance on UE support; ongoing modA w/ staggered UE support (10/14) Goal status: ONGOING  4.  to be assesed/LTG written Baseline: assessed 06/08/23 Goal status: MET   LONG TERM GOALS: Target date: 07/06/2023  Patient will report demonstrate independence with final HEP in order to maintain current gains and continue to progress after physical therapy discharge.   Baseline: To be provided Goal status: INITIAL  2.  Patient will improve gait speed to 0.4 m/s or greater to indicate an improvement from being a household ambulator to limited community ambulator.   Baseline: 0.19 m/s with rollator Goal status: INITIAL  3.  Patient will be able to perform  sit to stand stand transfer from transport chair with minA to indicate improved LE strength. Baseline: modA with strong reliance on UE support Goal status: INITIAL  4.  Pt will ambulate greater than or equal to 100 feet on with LRAD and CGA for improved cardiovascular endurance and BLE strength.  Baseline: 57 ft with RW min A (9/24) Goal status: INITIAL  ASSESSMENT:  CLINICAL IMPRESSION: Time spent addressing bed mobility, particularly supine to and from sitting EOM.  He did not  bring leg loops today so increased time spent addressing mobility without hospital bed features or leg loops to maximize functional strength.  His chronic posture and RLE weakness appear to be large limiters to progression of safe ambulation and functional mobility.  Did request the caregiver to attend about 15 minutes of coming session to address simple safety points with transport chair and advancing the RLE to practice better mechanics at home.  She is agreeable as is patient so will proceed per POC.  OBJECTIVE IMPAIRMENTS: Abnormal gait, decreased balance, decreased endurance, decreased knowledge of use of DME, decreased mobility, difficulty walking, decreased ROM, increased muscle spasms, impaired tone, and impaired UE functional use.   ACTIVITY LIMITATIONS: carrying, standing, squatting, transfers, dressing, reach over head, and locomotion level  PARTICIPATION LIMITATIONS: meal prep, laundry, community activity, and yard work  PERSONAL FACTORS: Age, Time since onset of injury/illness/exacerbation, and 1-2 comorbidities: see above  are also affecting patient's functional outcome.   REHAB POTENTIAL: Good  CLINICAL DECISION MAKING: Evolving/moderate complexity  EVALUATION COMPLEXITY: Moderate  PLAN:  PT FREQUENCY: 1x/week  PT DURATION: 6 weeks  PLANNED INTERVENTIONS: Therapeutic exercises, Therapeutic activity, Neuromuscular re-education, Balance training, Gait training, Patient/Family education, Self Care, Dry Needling, Manual therapy, and Re-evaluation  PLAN FOR NEXT SESSION: Add to HEP with emphasis on mobility/strength/balance as able, review use of 2WW versus rollator, work on increasing trunk ROM, breaking down steps of bed mobility, can he tolerate tall-kneel at bench?  Is caregiver able to attend for safety training with transport chair and walking (RLE advancement)?  Camille Bal, PT, DPT 06/28/2023, 4:19 PM

## 2023-06-29 ENCOUNTER — Other Ambulatory Visit: Payer: Medicare Other | Admitting: Pharmacist

## 2023-06-29 NOTE — Progress Notes (Signed)
06/29/2023  Patient ID: [name], male   DOB: 1946-03-11, 77 y.o.   MRN: [id number]  Patient appearing on report for [employer] [facility] - Hypertension Control report due to last documented ambulatory blood pressure of 146/68 on 06/28/23. Next appointment with PCP is unknown (Dr. Donette Larry with [facility]).   Outreached patient to discuss hypertension control and medication management.   Current antihypertensives: Amlodipine-Valsartan 5-320mg , Toprol XL 50mg   Patient has an automated upper arm home BP machine.  Current blood pressure readings: High 140's/60-70's  Current meal patterns: Mainly pescatarian with occasional hamburger or hotdog Breakfast: Oatmeal with blueberries, few slices of cheese, banana, coffee (2 cups), orange juice Lunch: PB + jelly sandwich half sandwich Dinner: Salmon, vegetables Drinks: Water   Patient denies hypotensive signs and symptoms including dizziness, lightheadedness.  Patient denies hypertensive symptoms including headache, chest pain, shortness of breath.  Patient denies side effects related to medications.     Assessment/Plan: - Currently uncontrolled - - Reviewed goal blood pressure <130/80 - Reviewed appropriate home BP monitoring technique (avoid caffeine, smoking, and exercise for 30 minutes before checking, rest for at least 5 minutes before taking BP, sit with feet flat on the floor and back against a hard surface, uncross legs, and rest arm on flat surface)  **Follow-Up Plan:** - Left voicemail with PCP office regarding recommendation of increasing Exforge to 10-320mg  - Will schedule follow-up depending on if recommendations for change are made  Marlowe Aschoff, PharmD [facility] Phone Number: [phone]

## 2023-06-30 ENCOUNTER — Ambulatory Visit (INDEPENDENT_AMBULATORY_CARE_PROVIDER_SITE_OTHER): Payer: Medicare Other

## 2023-06-30 DIAGNOSIS — I480 Paroxysmal atrial fibrillation: Secondary | ICD-10-CM

## 2023-06-30 DIAGNOSIS — I442 Atrioventricular block, complete: Secondary | ICD-10-CM

## 2023-07-01 ENCOUNTER — Telehealth: Payer: Self-pay

## 2023-07-01 LAB — CUP PACEART REMOTE DEVICE CHECK
Battery Remaining Percentage: 90 %
Brady Statistic RV Percent Paced: 100 %
Date Time Interrogation Session: 20241016073206
Implantable Lead Connection Status: 753985
Implantable Lead Implant Date: 20240115
Implantable Lead Location: 753860
Implantable Lead Model: 377
Implantable Lead Serial Number: 8001096875
Implantable Pulse Generator Implant Date: 20240115
Lead Channel Impedance Value: 566 Ohm
Lead Channel Pacing Threshold Amplitude: 1.2 V
Lead Channel Pacing Threshold Pulse Width: 0.4 ms
Lead Channel Sensing Intrinsic Amplitude: 8.8 mV
Lead Channel Setting Pacing Amplitude: 2.6 V
Lead Channel Setting Pacing Pulse Width: 0.4 ms
Pulse Gen Model: 407157
Pulse Gen Serial Number: 70421235

## 2023-07-01 NOTE — Telephone Encounter (Signed)
Patient called reporting his renal doctor who advised his doctor told him he had extra beats in his heart while listening. Pt asymptomatic.  Pt reports he will continue to monitor since no alerts have been triggered on device and call back if he has questions or concerns.

## 2023-07-01 NOTE — Telephone Encounter (Signed)
Pt states he was feeling extra beats and his kidney doctor heard extra beats. I told him the nurse states his reading looks good and there is nothing concerning on his transmission. He wants to know more about the extra beats. I let him speak with Leigh.

## 2023-07-05 ENCOUNTER — Encounter: Payer: Self-pay | Admitting: Physical Therapy

## 2023-07-05 ENCOUNTER — Ambulatory Visit: Payer: Medicare Other | Admitting: Physical Therapy

## 2023-07-05 DIAGNOSIS — M6281 Muscle weakness (generalized): Secondary | ICD-10-CM | POA: Diagnosis not present

## 2023-07-05 DIAGNOSIS — R269 Unspecified abnormalities of gait and mobility: Secondary | ICD-10-CM

## 2023-07-05 DIAGNOSIS — R2681 Unsteadiness on feet: Secondary | ICD-10-CM

## 2023-07-05 NOTE — Patient Instructions (Signed)
Access Code: [email or web address] URL: [email or web address] Date: 07/05/2023 Prepared by: Camille Bal  Exercises - Supine Bridge  - 1 x daily - 7 x weekly - 3 sets - 10 reps - Seated Reach Forward, Up, and To Sides  - 1 x daily - 7 x weekly - 3 sets - 10 reps - Seated Trunk Rotation  - 1 x daily - 7 x weekly - 3 sets - 10 reps - Supine Lower Trunk Rotation  - 1 x daily - 7 x weekly - 2 sets - 10 reps

## 2023-07-05 NOTE — Therapy (Signed)
OUTPATIENT PHYSICAL THERAPY NEURO TREATMENT   Patient Name: [name] MRN: [id number] DOB:22-Jun-1946, 77 y.o., male Today's Date: 07/05/2023   PCP: Dr. Lafe Garin Family Physician  REFERRING PROVIDER: Suanne Marker, MD  END OF SESSION:  PT End of Session - 07/05/23 1542     Visit Number 5    Number of Visits 7    Date for PT Re-Evaluation 07/20/23    Authorization Type Medicare    PT Start Time 1537   PT with pt prior   PT Stop Time 1619    PT Time Calculation (min) 42 min    Equipment Utilized During Treatment Gait belt    Activity Tolerance Patient tolerated treatment well    Behavior During Therapy WFL for tasks assessed/performed             Past Medical History:  Diagnosis Date   Anemia    Aneurysm of aortic arch (HCC) 04/04/2012   Chronic aneurysmal dilatation of aortic arch with chronic type A aortic dissection, s/p replacement of ascending thoracic aorta   Aortic dissection (HCC) 11/05/1998   S/P emergency repair of acute type A aortic dissection with resuspension of native aortic valve   Aortic dissection, thoracic (HCC)    Aortic root aneurysm    Atrial fibrillation (HCC) 01/30/2013   Atrial fibrillation    Bilateral lower extremity edema    BPH (benign prostatic hyperplasia)    CHF (congestive heart failure) (HCC) 10-May-1946   2D Echo - EF 50-55%, mild-moderately dilated right ventricle, mild-moderate tricuspid valve regurgitation, moderately dilated right atrium   Depression    Dyslipidemia    Exertional shortness of breath    "for awhile now" (09/11/2013)   Hemorrhoids    History of blood transfusion    Hypertension    Mitral regurgitation 02/20/2013   Multiple sclerosis (HCC)    Neuromuscular disorder (HCC)    Pleural effusion, right, large 01/30/2013   Severe tricuspid valve regurgitation 01/30/2013   Severe tricuspid regurg by recent 2-D echo with a dilated tricuspid annulus, moderate pulmonary hypertension and biatrial enlargement     Thrombocytopenia (HCC)    Past Surgical History:  Procedure Laterality Date   HEMORRHOID SURGERY  2009   Internal, external hemorrhoidectomy, general anesthesia,prone position. [Other]   Open reduction and internal fixation of right distal radius fracture using [employer] distal radius volar locking plate.  07/2005   Dr Magnus Ivan   PACEMAKER IMPLANT N/A 09/28/2022   Procedure: PACEMAKER IMPLANT;  Surgeon: Marinus Maw, MD;  Location: [facility] INVASIVE CV LAB;  Service: Cardiovascular;  Laterality: N/A;   Repair of acute type A aortic dissection with resuspension of native aortic valve  10/15/1998   Dr Cornelius Moras   TEE WITHOUT CARDIOVERSION N/A 02/17/2013   Procedure: TRANSESOPHAGEAL ECHOCARDIOGRAM (TEE);  Surgeon: Thurmon Fair, MD;  Location: [facility] ENDOSCOPY;  Service: Cardiovascular;  Laterality: N/A;   TEMPORARY PACEMAKER N/A 09/26/2022   Procedure: TEMPORARY PACEMAKER;  Surgeon: Tonny Bollman, MD;  Location: [facility] INVASIVE CV LAB;  Service: Cardiovascular;  Laterality: N/A;   Patient Active Problem List   Diagnosis Date Noted   Bradycardia 09/27/2022   Pacemaker 09/27/2022   Symptomatic bradycardia 09/26/2022   Paroxysmal atrial fibrillation (HCC) 09/26/2022   Essential hypertension 09/26/2022   Acute renal failure superimposed on stage 3b chronic kidney disease (HCC) 09/25/2022   Aortic root aneurysm    H/O aortic dissection 02/13/2016   Aneurysm of aortic arch (HCC) 12/31/2014   Tricuspid regurgitation 10/08/2014   RVF (right ventricular  failure) (HCC) 10/08/2014   Chronic diastolic CHF (congestive heart failure) (HCC) 10/09/2013   Sinus pause 09/16/2013   Anemia due to blood loss, acute in setting of supratheraputic INR 09/11/13 09/12/2013   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 09/12/2013   Acute renal insufficiency 09/12/2013   Anasarca 09/12/2013   Anemia of chronic disease 09/12/2013   Bilateral leg edema 09/11/2013   Acute right-sided congestive heart failure  (HCC) 09/11/2013   Unstable gait 09/11/2013   Dissection of aorta, thoracic- surgery Feb 2000 02/20/2013   Mitral regurgitation 02/20/2013   Lower extremity edema 02/20/2013   Volume overload 01/30/2013   Severe tricuspid valve regurgitation 01/30/2013   Pleural effusion, right, large 01/30/2013   Multiple sclerosis (HCC) 01/13/2013   Thrombocytopenia (HCC) 07/26/2011   Aortic dissection- chronic abd dissection  11/05/1998    ONSET DATE: 02/23/2023 (referral date)  REFERRING DIAG: G35 (ICD-10-CM) - Multiple sclerosis  THERAPY DIAG:  Muscle weakness (generalized)  Abnormality of gait and mobility  Unsteadiness on feet  Rationale for Evaluation and Treatment: Rehabilitation  SUBJECTIVE:                                                                                                                                                                                             SUBJECTIVE STATEMENT: Patient reports he is doing well. Denies falls and near falls.  He did not bring leg loops today as he does not know where they are, but he will have somebody look for them.  He reports he is walking with rollator at home.  He is wearing right custom AFO today.    Pt accompanied by: self and caretaker  PERTINENT HISTORY: Pacemaker beginning of January placed, AKI, afib, bradycardia  PAIN:  Are you having pain? No  PRECAUTIONS: Fall and ICD/Pacemaker  RED FLAGS: None   WEIGHT BEARING RESTRICTIONS: No  FALLS: Has patient fallen in last 6 months? No  LIVING ENVIRONMENT: Lives with: lives with their spouse Lives in: House/apartment Stairs:  House has stairs but has hospital bed on first level Has following equipment at home: Dan Humphreys - 4 wheeled, Wheelchair (manual), shower chair, and Grab bars  PLOF: Requires 24/7 caretaker assistance since March 2024,   PATIENT GOALS: "My objective is to walk better and longer."  OBJECTIVE:   DIAGNOSTIC FINDINGS:   MR Brain WO Contrast  09/22/2018: IMPRESSION: This MRI of the brain with and without contrast shows multiple T2/flair hyperintense foci in the brainstem, cerebellum and hemispheres in a pattern and configuration consistent with chronic demyelinating plaque associated with multiple sclerosis.  None of the foci appears to be acute.  There  are no acute findings and there is a normal enhancement pattern.  There are no definite new lesions compared to the 2016 MRI.  COGNITION: Overall cognitive status: Impaired - Word recall particularly names   TODAY'S TREATMENT:                                                                                                                               TherAct Assessed spO2 in supine as pt demonstrated difficulty relaxing and breathing in position, 98%, 85bpm, time spent coaching pt through paced breathing -Bed mobility: sit <> supine CGA-minA w/ increased time, completed once - pt has most difficulty with log rolling due to reported and notable fear of falling once in side-lying on EOM, preferring to pull to sit from supine as he does at home  TherEx: -Supine LTRs to fatigue (~23 reps before loss of controlled ROM to left)  Elevated pt feet on 4" step in sitting EOM: -Seated forward reaching using bar of RW as target to clear to encourage posterior trunk righting x10 -Seated forward floor reach (~3-4 inches above box target before unsteady) x10 -Seated lateral reach x10 each side, pt most unsteady with this -Seated trunk rotation x10 each side, cues to increase trunk engagement.  PATIENT EDUCATION: Education details: Continue HEP with additions, bring leg loops for further assessment and practice. Discussed STS for functional strengthening with caregiver assist as needed - pt states "I stand when I need." - Discussed guarding and safety with temporary afternoon Caregiver "Tely" in lobby. Person educated: Patient and Caregiver Tely Education method: Explanation, Tactile cues, Verbal  cues, and Handouts Education comprehension: verbalized understanding and needs further education  HOME EXERCISE PROGRAM: Access Code: [email or web address] URL: [email or web address] Date: 07/05/2023 Prepared by: Camille Bal  Exercises - Supine Bridge  - 1 x daily - 7 x weekly - 3 sets - 10 reps - Seated Reach Forward, Up, and To Sides  - 1 x daily - 7 x weekly - 3 sets - 10 reps - Seated Trunk Rotation  - 1 x daily - 7 x weekly - 3 sets - 10 reps - Supine Lower Trunk Rotation  - 1 x daily - 7 x weekly - 2 sets - 10 reps  GOALS: Goals reviewed with patient? Yes  SHORT TERM GOALS: Target date: 06/15/2023  Patient will demonstrate 100% compliance with initial HEP to continue to progress between physical therapy sessions.   Baseline: Introductory exercise, pt not doing this, reprinted (10/14) Goal status: NOT MET  2.  Patient will improve gait speed to 0.3 m/s with LRAD or greater to indicate an improvement towards level of limited community ambulator.   Baseline: 0.19 m/s with rollator; 0.86m/sec (10/14) Goal status: ONGOING  3.  Patient will be able to perform sit to stand stand transfer from transport chair with minA to indicate improved LE strength. Baseline: modA with strong reliance on UE support; ongoing modA w/ staggered UE support (10/14) Goal status: ONGOING  4.  to be assesed/LTG written Baseline: assessed 06/08/23 Goal status: MET   LONG TERM GOALS: Target date: 07/06/2023  Patient will report demonstrate independence with final HEP in order to maintain current gains and continue to progress after physical therapy discharge.   Baseline: To be provided Goal status: INITIAL  2.  Patient will improve gait speed to 0.4 m/s or greater to indicate an improvement from being a household ambulator to limited community ambulator.   Baseline: 0.19 m/s with rollator Goal status: INITIAL  3.  Patient will be able to perform sit to stand stand transfer from  transport chair with minA to indicate improved LE strength. Baseline: modA with strong reliance on UE support Goal status: INITIAL  4.  Pt will ambulate greater than or equal to 100 feet on with LRAD and CGA for improved cardiovascular endurance and BLE strength.  Baseline: 57 ft with RW min A (9/24) Goal status: INITIAL  ASSESSMENT:  CLINICAL IMPRESSION: Time spent addressing bed mobility again today, particularly supine to and from sitting EOM with pt having difficulty relaxing in supine.  Remainder of session spent addressing pelvic and trunk mobility with reaching and rotation tasks.  Pt has poor seated postural alignment and righting reactions due to chronic deficits.  He would benefit from further PT to better address these issues.    OBJECTIVE IMPAIRMENTS: Abnormal gait, decreased balance, decreased endurance, decreased knowledge of use of DME, decreased mobility, difficulty walking, decreased ROM, increased muscle spasms, impaired tone, and impaired UE functional use.   ACTIVITY LIMITATIONS: carrying, standing, squatting, transfers, dressing, reach over head, and locomotion level  PARTICIPATION LIMITATIONS: meal prep, laundry, community activity, and yard work  PERSONAL FACTORS: Age, Time since onset of injury/illness/exacerbation, and 1-2 comorbidities: see above  are also affecting patient's functional outcome.   REHAB POTENTIAL: Good  CLINICAL DECISION MAKING: Evolving/moderate complexity  EVALUATION COMPLEXITY: Moderate  PLAN:  PT FREQUENCY: 1x/week  PT DURATION: 6 weeks  PLANNED INTERVENTIONS: Therapeutic exercises, Therapeutic activity, Neuromuscular re-education, Balance training, Gait training, Patient/Family education, Self Care, Dry Needling, Manual therapy, and Re-evaluation  PLAN FOR NEXT SESSION: Add to HEP with emphasis on mobility/strength/balance as able, review use of 2WW versus rollator, work on increasing trunk ROM/postural alignment in sitting,  breaking down steps of bed mobility, can he tolerate tall-kneel at bench?  Is caregiver able to attend for safety training with transport chair and walking (RLE advancement)?  Camille Bal, PT, DPT 07/05/2023, 4:20 PM

## 2023-07-12 ENCOUNTER — Ambulatory Visit: Payer: Medicare Other | Admitting: Physical Therapy

## 2023-07-12 ENCOUNTER — Encounter: Payer: Self-pay | Admitting: Occupational Therapy

## 2023-07-12 ENCOUNTER — Ambulatory Visit: Payer: Medicare Other | Admitting: Occupational Therapy

## 2023-07-12 DIAGNOSIS — R2681 Unsteadiness on feet: Secondary | ICD-10-CM

## 2023-07-12 DIAGNOSIS — M6281 Muscle weakness (generalized): Secondary | ICD-10-CM | POA: Diagnosis not present

## 2023-07-12 DIAGNOSIS — R29818 Other symptoms and signs involving the nervous system: Secondary | ICD-10-CM

## 2023-07-12 DIAGNOSIS — R278 Other lack of coordination: Secondary | ICD-10-CM

## 2023-07-12 DIAGNOSIS — R29898 Other symptoms and signs involving the musculoskeletal system: Secondary | ICD-10-CM

## 2023-07-12 DIAGNOSIS — R269 Unspecified abnormalities of gait and mobility: Secondary | ICD-10-CM

## 2023-07-12 NOTE — Therapy (Unsigned)
OUTPATIENT OCCUPATIONAL THERAPY NEURO EVALUATION  Patient Name: [name] MRN: [id number] DOB:1945/12/23, 77 y.o., male Today's Date: 07/12/2023  PCP: Eloisa Northern, MD REFERRING PROVIDER: Suanne Marker, MD  END OF SESSION:  OT End of Session - 07/12/23 1447     Visit Number 1    Number of Visits 8   + evaluation   Date for OT Re-Evaluation 09/10/23    Authorization Type Medicare A&B covered @ 100% w/ State BCBS    OT Start Time 1448    OT Stop Time 1530    OT Time Calculation (min) 42 min    Equipment Utilized During Treatment Testing Material    Activity Tolerance Patient tolerated treatment well    Behavior During Therapy WFL for tasks assessed/performed             Past Medical History:  Diagnosis Date   Anemia    Aneurysm of aortic arch (HCC) 04/04/2012   Chronic aneurysmal dilatation of aortic arch with chronic type A aortic dissection, s/p replacement of ascending thoracic aorta   Aortic dissection (HCC) 11/05/1998   S/P emergency repair of acute type A aortic dissection with resuspension of native aortic valve   Aortic dissection, thoracic (HCC)    Aortic root aneurysm    Atrial fibrillation (HCC) 01/30/2013   Atrial fibrillation    Bilateral lower extremity edema    BPH (benign prostatic hyperplasia)    CHF (congestive heart failure) (HCC) [birth year] 30[birth year] 1947   2D Echo - EF 50-55%, mild-moderately dilated right ventricle, mild-moderate tricuspid valve regurgitation, moderately dilated right atrium   Depression    Dyslipidemia    Exertional shortness of breath    "for awhile now" (09/11/2013)   Hemorrhoids    History of blood transfusion    Hypertension    Mitral regurgitation 02/20/2013   Multiple sclerosis (HCC)    Neuromuscular disorder (HCC)    Pleural effusion, right, large 01/30/2013   Severe tricuspid valve regurgitation 01/30/2013   Severe tricuspid regurg by recent 2-D echo with a dilated tricuspid annulus, moderate pulmonary hypertension and  biatrial enlargement    Thrombocytopenia (HCC)    Past Surgical History:  Procedure Laterality Date   HEMORRHOID SURGERY  2009   Internal, external hemorrhoidectomy, general anesthesia,prone position. [Other]   Open reduction and internal fixation of right distal radius fracture using [employer] distal radius volar locking plate.  07/2005   Dr Magnus Ivan   PACEMAKER IMPLANT N/A 09/28/2022   Procedure: PACEMAKER IMPLANT;  Surgeon: Marinus Maw, MD;  Location: [facility] INVASIVE CV LAB;  Service: Cardiovascular;  Laterality: N/A;   Repair of acute type A aortic dissection with resuspension of native aortic valve  10/15/1998   Dr Cornelius Moras   TEE WITHOUT CARDIOVERSION N/A 02/17/2013   Procedure: TRANSESOPHAGEAL ECHOCARDIOGRAM (TEE);  Surgeon: Thurmon Fair, MD;  Location: [facility] ENDOSCOPY;  Service: Cardiovascular;  Laterality: N/A;   TEMPORARY PACEMAKER N/A 09/26/2022   Procedure: TEMPORARY PACEMAKER;  Surgeon: Tonny Bollman, MD;  Location: [facility] INVASIVE CV LAB;  Service: Cardiovascular;  Laterality: N/A;   Patient Active Problem List   Diagnosis Date Noted   Bradycardia 09/27/2022   Pacemaker 09/27/2022   Symptomatic bradycardia 09/26/2022   Paroxysmal atrial fibrillation (HCC) 09/26/2022   Essential hypertension 09/26/2022   Acute renal failure superimposed on stage 3b chronic kidney disease (HCC) 09/25/2022   Aortic root aneurysm    H/O aortic dissection 02/13/2016   Aneurysm of aortic arch (HCC) 12/31/2014   Tricuspid regurgitation 10/08/2014   RVF (right ventricular  failure) (HCC) 10/08/2014   Chronic diastolic CHF (congestive heart failure) (HCC) 10/09/2013   Sinus pause 09/16/2013   Anemia due to blood loss, acute in setting of supratheraputic INR 09/11/13 09/12/2013   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 09/12/2013   Acute renal insufficiency 09/12/2013   Anasarca 09/12/2013   Anemia of chronic disease 09/12/2013   Bilateral leg edema 09/11/2013   Acute right-sided  congestive heart failure (HCC) 09/11/2013   Unstable gait 09/11/2013   Dissection of aorta, thoracic- surgery Feb 2000 02/20/2013   Mitral regurgitation 02/20/2013   Lower extremity edema 02/20/2013   Volume overload 01/30/2013   Severe tricuspid valve regurgitation 01/30/2013   Pleural effusion, right, large 01/30/2013   Multiple sclerosis (HCC) 01/13/2013   Thrombocytopenia (HCC) 07/26/2011   Aortic dissection- chronic abd dissection  11/05/1998    ONSET DATE: 06/11/2023  REFERRING DIAG: REFERRING DIAG: G35 (ICD-10-CM) - Multiple sclerosis M62.81 (ICD-10-CM) - Muscle weakness (generalized)  THERAPY DIAG:  Other lack of coordination  Muscle weakness (generalized)  Unsteadiness on feet  Other symptoms and signs involving the nervous system  Other symptoms and signs involving the musculoskeletal system  Rationale for Evaluation and Treatment: Rehabilitation  SUBJECTIVE:   SUBJECTIVE STATEMENT: UE exercises  Progressive MS  Pt accompanied by:  Caregiver 24/7 x 4 incluidng overnight  Octavia  PERTINENT HISTORY:   MR Brain WO Contrast 09/22/2018: IMPRESSION: This MRI of the brain with and without contrast shows multiple T2/flair hyperintense foci in the brainstem, cerebellum and hemispheres in a pattern and configuration consistent with chronic demyelinating plaque associated with multiple sclerosis.  None of the foci appears to be acute.  There are no acute findings and there is a normal enhancement pattern.  There are no definite new lesions compared to the 2016 MRI.  PRECAUTIONS: {Therapy precautions:24002}  WEIGHT BEARING RESTRICTIONS: {Yes ***/No:24003}  PAIN:  Are you having pain? {OPRCPAIN:27236}  FALLS: Has patient fallen in last 6 months? {fallsyesno:27318}  LIVING ENVIRONMENT: Lives with: lives with an adult companion Lives in: House Stairs:  added ramp in the garage Has following equipment at home: Dan Humphreys - 2 wheeled, Environmental consultant - 4 wheeled, Wheelchair  (manual), shower chair, bed side commode, Grab bars, and Ramped entry  PLOF: Needs assistance with ADLs, Needs assistance with homemaking, Needs assistance with gait, and Needs assistance with transfers - 24 hour care since 12/03/22, Pt drives with hand controls   PATIENT GOALS: Updated HEP for UEs  OBJECTIVE:  Note: Objective measures were completed at Evaluation unless otherwise noted.  HAND DOMINANCE: Right  ADLs: Overall ADLs: *** Transfers/ambulation related to ADLs: Eating: *** Grooming: *** UB Dressing: *** LB Dressing: *** Toileting: *** Bathing: *** Tub Shower transfers: *** Equipment: {equipment:25573}  IADLs: Shopping: *** Light housekeeping: *** Meal Prep: *** Community mobility: *** Medication management: *** Financial management: *** Handwriting: {OTWRITTENEXPRESSION:25361}  MOBILITY STATUS: {OTMOBILITY:25360}  POSTURE COMMENTS:  {posture:25561} Sitting balance: {sitting balance:25483}  ACTIVITY TOLERANCE: Activity tolerance: ***  FUNCTIONAL OUTCOME MEASURES: {OTFUNCTIONALMEASURES:27238}  UPPER EXTREMITY ROM:    {AROM/PROM:27142} ROM Right eval Left eval  Shoulder flexion Moves into scaption   Shoulder abduction 90   Shoulder adduction    Shoulder extension    Shoulder internal rotation    Shoulder external rotation    Elbow flexion [facility] WFL  Elbow extension [facility] WFL  Wrist flexion    Wrist extension    Wrist ulnar deviation    Wrist radial deviation    Wrist pronation    Wrist supination    (  Blank rows = not tested)  UPPER EXTREMITY MMT:     MMT Right eval Left eval  Shoulder flexion 4 4  Shoulder abduction    Shoulder adduction    Shoulder extension    Shoulder internal rotation    Shoulder external rotation    Middle trapezius    Lower trapezius    Elbow flexion 4 4  Elbow extension 3 3  Wrist flexion    Wrist extension    Wrist ulnar deviation    Wrist radial deviation    Wrist pronation    Wrist supination     (Blank rows = not tested)  HAND FUNCTION: Grip strength: Right: 33.7, 34.8, 36.1  lbs; Left: 39.9, 46.7, 41.0 lbs  COORDINATION: 9 Hole Peg test: Right: 92.54 sec; Left: 3:08.66 sec  SENSATION: WFL  EDEMA: None in UE just R LE  MUSCLE TONE: {UETONE:25567}  COGNITION: Overall cognitive status: {cognition:24006} word finding difficulties  VISION: Subjective report: 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 un amblyopia (lazy eye) - L eye gets corrected to near 20/20 but needs new prescription  Baseline vision: Bifocals Visual history:  has trouble driving at night esepcially with bright lights  VISION ASSESSMENT: {visionassessment:27231}  Patient has difficulty with following activities due to following visual impairments: ***  PERCEPTION: {Perception:25564}  PRAXIS: {Praxis:25565}  OBSERVATIONS: ***   TODAY'S TREATMENT:                                                                                                                              DATE: ***   PATIENT EDUCATION: Education details: *** Person educated: {Person educated:25204} Education method: {Education Method:25205} Education comprehension: {Education Comprehension:25206}  HOME EXERCISE PROGRAM: ***   GOALS: Goals reviewed with patient? {yes/no:20286}  SHORT TERM GOALS: Target date: ***  *** Baseline: Goal status: INITIAL  2.  *** Baseline:  Goal status: INITIAL  3.  *** Baseline:  Goal status: INITIAL  4.  *** Baseline:  Goal status: INITIAL  5.  *** Baseline:  Goal status: INITIAL  6.  *** Baseline:  Goal status: INITIAL  LONG TERM GOALS: Target date: ***  *** Baseline:  Goal status: INITIAL  2.  *** Baseline:  Goal status: INITIAL  3.  *** Baseline:  Goal status: INITIAL  4.  *** Baseline:  Goal status: INITIAL  5.  *** Baseline:  Goal status: INITIAL  6.  *** Baseline:  Goal status: INITIAL  ASSESSMENT:  CLINICAL IMPRESSION: Patient is a  *** y.o. *** who was seen today for occupational therapy evaluation for ***.   PERFORMANCE DEFICITS: in functional skills including {OT physical skills:25468}, cognitive skills including {OT cognitive skills:25469}, and psychosocial skills including {OT psychosocial skills:25470}.   IMPAIRMENTS: are limiting patient from {OT performance deficits:25471}.   CO-MORBIDITIES: {Comorbidities:25485} that affects occupational performance. Patient will benefit from skilled OT to address above impairments and improve overall function.  MODIFICATION OR ASSISTANCE TO COMPLETE EVALUATION: {OT modification:25474}  OT OCCUPATIONAL  PROFILE AND HISTORY: {OT PROFILE AND HISTORY:25484}  CLINICAL DECISION MAKING: {OT CDM:25475}  REHAB POTENTIAL: {rehabpotential:25112}  EVALUATION COMPLEXITY: {Evaluation complexity:25115}    PLAN:  OT FREQUENCY: {rehab frequency:25116}  OT DURATION: {rehab duration:25117}  PLANNED INTERVENTIONS: {OT Interventions:25467}  RECOMMENDED OTHER SERVICES: ***  CONSULTED AND AGREED WITH PLAN OF CARE: {QQV:95638}  PLAN FOR NEXT SESSION: ***   Victorino Sparrow, OT 07/12/2023, 5:30 PM

## 2023-07-12 NOTE — Therapy (Signed)
OUTPATIENT PHYSICAL THERAPY NEURO TREATMENT   Patient Name: [name] MRN: [id number] DOB:1946/08/10, 77 y.o., male Today's Date: 07/12/2023   PCP: Dr. Lafe Garin Family Physician  REFERRING PROVIDER: Suanne Marker, MD  END OF SESSION:  PT End of Session - 07/12/23 1532     Visit Number 6    Number of Visits 7    Date for PT Re-Evaluation 07/20/23    Authorization Type Medicare    PT Start Time 1532   from OT session   PT Stop Time 1615    PT Time Calculation (min) 43 min    Equipment Utilized During Treatment Gait belt    Activity Tolerance Patient tolerated treatment well    Behavior During Therapy WFL for tasks assessed/performed              Past Medical History:  Diagnosis Date   Anemia    Aneurysm of aortic arch (HCC) 04/04/2012   Chronic aneurysmal dilatation of aortic arch with chronic type A aortic dissection, s/p replacement of ascending thoracic aorta   Aortic dissection (HCC) 11/05/1998   S/P emergency repair of acute type A aortic dissection with resuspension of native aortic valve   Aortic dissection, thoracic (HCC)    Aortic root aneurysm    Atrial fibrillation (HCC) 01/30/2013   Atrial fibrillation    Bilateral lower extremity edema    BPH (benign prostatic hyperplasia)    CHF (congestive heart failure) (HCC) May 11, 1946   2D Echo - EF 50-55%, mild-moderately dilated right ventricle, mild-moderate tricuspid valve regurgitation, moderately dilated right atrium   Depression    Dyslipidemia    Exertional shortness of breath    "for awhile now" (09/11/2013)   Hemorrhoids    History of blood transfusion    Hypertension    Mitral regurgitation 02/20/2013   Multiple sclerosis (HCC)    Neuromuscular disorder (HCC)    Pleural effusion, right, large 01/30/2013   Severe tricuspid valve regurgitation 01/30/2013   Severe tricuspid regurg by recent 2-D echo with a dilated tricuspid annulus, moderate pulmonary hypertension and biatrial enlargement     Thrombocytopenia (HCC)    Past Surgical History:  Procedure Laterality Date   HEMORRHOID SURGERY  2009   Internal, external hemorrhoidectomy, general anesthesia,prone position. [Other]   Open reduction and internal fixation of right distal radius fracture using [employer] distal radius volar locking plate.  07/2005   Dr Magnus Ivan   PACEMAKER IMPLANT N/A 09/28/2022   Procedure: PACEMAKER IMPLANT;  Surgeon: Marinus Maw, MD;  Location: [facility] INVASIVE CV LAB;  Service: Cardiovascular;  Laterality: N/A;   Repair of acute type A aortic dissection with resuspension of native aortic valve  10/15/1998   Dr Cornelius Moras   TEE WITHOUT CARDIOVERSION N/A 02/17/2013   Procedure: TRANSESOPHAGEAL ECHOCARDIOGRAM (TEE);  Surgeon: Thurmon Fair, MD;  Location: [facility] ENDOSCOPY;  Service: Cardiovascular;  Laterality: N/A;   TEMPORARY PACEMAKER N/A 09/26/2022   Procedure: TEMPORARY PACEMAKER;  Surgeon: Tonny Bollman, MD;  Location: [facility] INVASIVE CV LAB;  Service: Cardiovascular;  Laterality: N/A;   Patient Active Problem List   Diagnosis Date Noted   Bradycardia 09/27/2022   Pacemaker 09/27/2022   Symptomatic bradycardia 09/26/2022   Paroxysmal atrial fibrillation (HCC) 09/26/2022   Essential hypertension 09/26/2022   Acute renal failure superimposed on stage 3b chronic kidney disease (HCC) 09/25/2022   Aortic root aneurysm    H/O aortic dissection 02/13/2016   Aneurysm of aortic arch (HCC) 12/31/2014   Tricuspid regurgitation 10/08/2014   RVF (right ventricular  failure) (HCC) 10/08/2014   Chronic diastolic CHF (congestive heart failure) (HCC) 10/09/2013   Sinus pause 09/16/2013   Anemia due to blood loss, acute in setting of supratheraputic INR 09/11/13 09/12/2013   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 09/12/2013   Acute renal insufficiency 09/12/2013   Anasarca 09/12/2013   Anemia of chronic disease 09/12/2013   Bilateral leg edema 09/11/2013   Acute right-sided congestive heart failure  (HCC) 09/11/2013   Unstable gait 09/11/2013   Dissection of aorta, thoracic- surgery Feb 2000 02/20/2013   Mitral regurgitation 02/20/2013   Lower extremity edema 02/20/2013   Volume overload 01/30/2013   Severe tricuspid valve regurgitation 01/30/2013   Pleural effusion, right, large 01/30/2013   Multiple sclerosis (HCC) 01/13/2013   Thrombocytopenia (HCC) 07/26/2011   Aortic dissection- chronic abd dissection  11/05/1998    ONSET DATE: 02/23/2023 (referral date)  REFERRING DIAG: G35 (ICD-10-CM) - Multiple sclerosis  THERAPY DIAG:  Muscle weakness (generalized)  Abnormality of gait and mobility  Unsteadiness on feet  Rationale for Evaluation and Treatment: Rehabilitation  SUBJECTIVE:                                                                                                                                                                                             SUBJECTIVE STATEMENT: Pt denies any acute changes since last visit, no complaints of pain today.  Pt accompanied by: self and caretaker  PERTINENT HISTORY: Pacemaker beginning of January placed, AKI, afib, bradycardia  PAIN:  Are you having pain? No  PRECAUTIONS: Fall and ICD/Pacemaker  RED FLAGS: None   WEIGHT BEARING RESTRICTIONS: No  FALLS: Has patient fallen in last 6 months? No  LIVING ENVIRONMENT: Lives with: lives with their spouse Lives in: House/apartment Stairs:  House has stairs but has hospital bed on first level Has following equipment at home: Dan Humphreys - 4 wheeled, Wheelchair (manual), shower chair, and Grab bars  PLOF: Requires 24/7 caretaker assistance since March 2024,   PATIENT GOALS: "My objective is to walk better and longer."  OBJECTIVE:   DIAGNOSTIC FINDINGS:   MR Brain WO Contrast 09/22/2018: IMPRESSION: This MRI of the brain with and without contrast shows multiple T2/flair hyperintense foci in the brainstem, cerebellum and hemispheres in a pattern and configuration  consistent with chronic demyelinating plaque associated with multiple sclerosis.  None of the foci appears to be acute.  There are no acute findings and there is a normal enhancement pattern.  There are no definite new lesions compared to the 2016 MRI.  COGNITION: Overall cognitive status: Impaired - Word recall particularly names   TODAY'S TREATMENT:  TherAct Sit to stand with max A from w/c to RW. Stand pivot transfer w/c to mat table with RW and min A for balance. Sit to stand x 5 reps from elevated mat table to RW with min A, focus on scooting to edge of mat table, anterior lean, and UE placement during transfer. Sit to supine max A needed for BLE management and trunk control.  TherEx: Attempted seated scap squeezes, pt tends to elevate shoulders and has pain in anterior L shoulder with manual assist and in R rhomboids with manual assist  Attempted supine thoracic mobilization over towel roll, difficult due to limited B shoulder AROM  Supine B shoulder ER/IR PROM, pt most limited with R IR when shoulder at 90 deg abduction Supine B shoulder biceps and pec stretch 3 x 30 sec each B, more tightness in LUE as compared to RUE    PATIENT EDUCATION: Education details: Continue working on sit to stands at home with decreased assist from lift chair as he progresses Person educated: Patient Education method: Programmer, multimedia, Facilities manager, Actor cues, and Verbal cues Education comprehension: verbalized understanding, returned demonstration, and needs further education  HOME EXERCISE PROGRAM: Access Code: [email or web address] URL: [email or web address] Date: 07/05/2023 Prepared by: Camille Bal  Exercises - Supine Bridge  - 1 x daily - 7 x weekly - 3 sets - 10 reps - Seated Reach Forward, Up, and To Sides  - 1 x daily - 7 x weekly - 3 sets - 10 reps - Seated  Trunk Rotation  - 1 x daily - 7 x weekly - 3 sets - 10 reps - Supine Lower Trunk Rotation  - 1 x daily - 7 x weekly - 2 sets - 10 reps  GOALS: Goals reviewed with patient? Yes  SHORT TERM GOALS: Target date: 06/15/2023  Patient will demonstrate 100% compliance with initial HEP to continue to progress between physical therapy sessions.   Baseline: Introductory exercise, pt not doing this, reprinted (10/14) Goal status: NOT MET  2.  Patient will improve gait speed to 0.3 m/s with LRAD or greater to indicate an improvement towards level of limited community ambulator.   Baseline: 0.19 m/s with rollator; 0.39m/sec (10/14) Goal status: ONGOING  3.  Patient will be able to perform sit to stand stand transfer from transport chair with minA to indicate improved LE strength. Baseline: modA with strong reliance on UE support; ongoing modA w/ staggered UE support (10/14) Goal status: ONGOING  4.  to be assesed/LTG written Baseline: assessed 06/08/23 Goal status: MET   LONG TERM GOALS: Target date: 07/19/2023 (updated to match last scheduled appt within POC)  Patient will report demonstrate independence with final HEP in order to maintain current gains and continue to progress after physical therapy discharge.   Baseline: To be provided Goal status: INITIAL  2.  Patient will improve gait speed to 0.4 m/s or greater to indicate an improvement from being a household ambulator to limited community ambulator.   Baseline: 0.19 m/s with rollator Goal status: INITIAL  3.  Patient will be able to perform sit to stand stand transfer from transport chair with minA to indicate improved LE strength. Baseline: modA with strong reliance on UE support; up to max A needed from transport chair (10/28) Goal status: NOT MET  4.  Pt will ambulate greater than or equal to 100 feet on with LRAD and CGA for improved cardiovascular endurance and BLE strength.  Baseline: 57 ft with RW min A (9/24) Goal  status: INITIAL  ASSESSMENT:  CLINICAL IMPRESSION: Emphasis of skilled PT session on initiating LTG assessment as well as attempting to work on UB stretching and improved postural alignment. Pt did not met 1/1 LTG assessed this date due to requiring max A to stand from his transport chair this date. Pt does progress to being able to stand with min A from elevated mat table. Pt with difficulty following multimodal cues to perform seated and supine stretches with significant UB tightness detected in his pecs, biceps, and B shoulders. Will assess remaining LTG next session. Continue POC.   OBJECTIVE IMPAIRMENTS: Abnormal gait, decreased balance, decreased endurance, decreased knowledge of use of DME, decreased mobility, difficulty walking, decreased ROM, increased muscle spasms, impaired tone, and impaired UE functional use.   ACTIVITY LIMITATIONS: carrying, standing, squatting, transfers, dressing, reach over head, and locomotion level  PARTICIPATION LIMITATIONS: meal prep, laundry, community activity, and yard work  PERSONAL FACTORS: Age, Time since onset of injury/illness/exacerbation, and 1-2 comorbidities: see above  are also affecting patient's functional outcome.   REHAB POTENTIAL: Good  CLINICAL DECISION MAKING: Evolving/moderate complexity  EVALUATION COMPLEXITY: Moderate  PLAN:  PT FREQUENCY: 1x/week  PT DURATION: 6 weeks  PLANNED INTERVENTIONS: Therapeutic exercises, Therapeutic activity, Neuromuscular re-education, Balance training, Gait training, Patient/Family education, Self Care, Dry Needling, Manual therapy, and Re-evaluation  PLAN FOR NEXT SESSION: assess remaining LTG and d/c?   Peter Congo, PT, DPT, CSRS  07/12/2023, 4:17 PM

## 2023-07-14 ENCOUNTER — Telehealth: Payer: Self-pay | Admitting: Internal Medicine

## 2023-07-14 NOTE — Telephone Encounter (Signed)
Spoke to pt. Pt states the symptoms seem to be getting worse over the past few weeks. Pt reports no new symptoms. Symptoms get worse with exercise and long amounts talking. Patient stated that symptoms do decrease with rest. Gave pt 911/ED precautions and told him I would have Dr Ladona Ridgel review.

## 2023-07-14 NOTE — Telephone Encounter (Signed)
Patient reports palpitations and increased shortness of breath for several weeks. States shortness of breath is worse when laying down. Patient denies swelling in lower extremities or cough. [employer] remote monitor checked and no alerts triggered over the past few months. Lead trends appear stable. No HVR alerts triggered. Advised patient I will forward to general triage to advise further. Pt appreciative of call.

## 2023-07-14 NOTE — Telephone Encounter (Signed)
Pt has had pace maker out in and is having extra heartbeats and beating heavily. Pt would like a call back to discuss this.

## 2023-07-19 ENCOUNTER — Ambulatory Visit: Payer: Medicare Other | Attending: Internal Medicine | Admitting: Physical Therapy

## 2023-07-19 ENCOUNTER — Ambulatory Visit: Payer: Medicare Other | Admitting: Occupational Therapy

## 2023-07-19 ENCOUNTER — Encounter: Payer: Self-pay | Admitting: Physical Therapy

## 2023-07-19 VITALS — BP 144/67 | HR 89

## 2023-07-19 DIAGNOSIS — R2681 Unsteadiness on feet: Secondary | ICD-10-CM | POA: Diagnosis present

## 2023-07-19 DIAGNOSIS — R269 Unspecified abnormalities of gait and mobility: Secondary | ICD-10-CM | POA: Insufficient documentation

## 2023-07-19 DIAGNOSIS — R29818 Other symptoms and signs involving the nervous system: Secondary | ICD-10-CM

## 2023-07-19 DIAGNOSIS — R278 Other lack of coordination: Secondary | ICD-10-CM | POA: Diagnosis present

## 2023-07-19 DIAGNOSIS — M6281 Muscle weakness (generalized): Secondary | ICD-10-CM | POA: Insufficient documentation

## 2023-07-19 DIAGNOSIS — R293 Abnormal posture: Secondary | ICD-10-CM | POA: Insufficient documentation

## 2023-07-19 DIAGNOSIS — R29898 Other symptoms and signs involving the musculoskeletal system: Secondary | ICD-10-CM | POA: Insufficient documentation

## 2023-07-19 NOTE — Therapy (Signed)
OUTPATIENT PHYSICAL THERAPY NEURO TREATMENT / DISCHARGE   Patient Name: [name] MRN: [id number] DOB:May 21, 1946, 77 y.o., male Today's Date: 07/19/2023  PHYSICAL THERAPY DISCHARGE SUMMARY  Visits from Start of Care: 7  Current functional level related to goals / functional outcomes: See below   Remaining deficits: Patient continues to use rollator at home despite PT recommendation for 2WW due to personal preference at this time   Education / Equipment: Continue to recommend 2WW when attempting walking, continue HEP   Patient agrees to discharge. Patient goals were partially met. Patient is being discharged due to maximized rehab potential.   PCP: Dr. Lafe Garin Family Physician  REFERRING PROVIDER: Suanne Marker, MD  END OF SESSION:  PT End of Session - 07/19/23 1445     Visit Number 7    Number of Visits 7    Date for PT Re-Evaluation 07/20/23    Authorization Type Medicare    PT Start Time 1448    PT Stop Time 1526    PT Time Calculation (min) 38 min    Equipment Utilized During Treatment Gait belt    Activity Tolerance Patient tolerated treatment well    Behavior During Therapy WFL for tasks assessed/performed             Past Medical History:  Diagnosis Date   Anemia    Aneurysm of aortic arch (HCC) 04/04/2012   Chronic aneurysmal dilatation of aortic arch with chronic type A aortic dissection, s/p replacement of ascending thoracic aorta   Aortic dissection (HCC) 11/05/1998   S/P emergency repair of acute type A aortic dissection with resuspension of native aortic valve   Aortic dissection, thoracic (HCC)    Aortic root aneurysm    Atrial fibrillation (HCC) 01/30/2013   Atrial fibrillation    Bilateral lower extremity edema    BPH (benign prostatic hyperplasia)    CHF (congestive heart failure) (HCC) 1946/03/29   2D Echo - EF 50-55%, mild-moderately dilated right ventricle, mild-moderate tricuspid valve regurgitation, moderately dilated  right atrium   Depression    Dyslipidemia    Exertional shortness of breath    "for awhile now" (09/11/2013)   Hemorrhoids    History of blood transfusion    Hypertension    Mitral regurgitation 02/20/2013   Multiple sclerosis (HCC)    Neuromuscular disorder (HCC)    Pleural effusion, right, large 01/30/2013   Severe tricuspid valve regurgitation 01/30/2013   Severe tricuspid regurg by recent 2-D echo with a dilated tricuspid annulus, moderate pulmonary hypertension and biatrial enlargement    Thrombocytopenia [facility])    Past Surgical History:  Procedure Laterality Date   HEMORRHOID SURGERY  2009   Internal, external hemorrhoidectomy, general anesthesia,prone position. [Other]   Open reduction and internal fixation of right distal radius fracture using [employer] distal radius volar locking plate.  07/2005   Dr Magnus Ivan   PACEMAKER IMPLANT N/A 09/28/2022   Procedure: PACEMAKER IMPLANT;  Surgeon: Marinus Maw, MD;  Location: [facility] INVASIVE CV LAB;  Service: Cardiovascular;  Laterality: N/A;   Repair of acute type A aortic dissection with resuspension of native aortic valve  10/15/1998   Dr Cornelius Moras   TEE WITHOUT CARDIOVERSION N/A 02/17/2013   Procedure: TRANSESOPHAGEAL ECHOCARDIOGRAM (TEE);  Surgeon: Thurmon Fair, MD;  Location: [facility] ENDOSCOPY;  Service: Cardiovascular;  Laterality: N/A;   TEMPORARY PACEMAKER N/A 09/26/2022   Procedure: TEMPORARY PACEMAKER;  Surgeon: Tonny Bollman, MD;  Location: [facility] INVASIVE CV LAB;  Service: Cardiovascular;  Laterality: N/A;  Patient Active Problem List   Diagnosis Date Noted   Bradycardia 09/27/2022   Pacemaker 09/27/2022   Symptomatic bradycardia 09/26/2022   Paroxysmal atrial fibrillation (HCC) 09/26/2022   Essential hypertension 09/26/2022   Acute renal failure superimposed on stage 3b chronic kidney disease (HCC) 09/25/2022   Aortic root aneurysm    H/O aortic dissection 02/13/2016   Aneurysm of aortic arch (HCC) 12/31/2014   Tricuspid  regurgitation 10/08/2014   RVF (right ventricular failure) (HCC) 10/08/2014   Chronic diastolic CHF (congestive heart failure) (HCC) 10/09/2013   Sinus pause 09/16/2013   Anemia due to blood loss, acute in setting of supratheraputic INR 09/11/13 09/12/2013   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 09/12/2013   Acute renal insufficiency 09/12/2013   Anasarca 09/12/2013   Anemia of chronic disease 09/12/2013   Bilateral leg edema 09/11/2013   Acute right-sided congestive heart failure (HCC) 09/11/2013   Unstable gait 09/11/2013   Dissection of aorta, thoracic- surgery Feb 2000 02/20/2013   Mitral regurgitation 02/20/2013   Lower extremity edema 02/20/2013   Volume overload 01/30/2013   Severe tricuspid valve regurgitation 01/30/2013   Pleural effusion, right, large 01/30/2013   Multiple sclerosis (HCC) 01/13/2013   Thrombocytopenia (HCC) 07/26/2011   Aortic dissection- chronic abd dissection  11/05/1998    ONSET DATE: 02/23/2023 (referral date)  REFERRING DIAG: G35 (ICD-10-CM) - Multiple sclerosis  THERAPY DIAG:  Muscle weakness (generalized)  Abnormality of gait and mobility  Unsteadiness on feet  Other symptoms and signs involving the nervous system  Rationale for Evaluation and Treatment: Rehabilitation  SUBJECTIVE:                                                                                                                                                                                             SUBJECTIVE STATEMENT: Patient arrives in transport chair. Patient denies any falls/near falls. Patient agreeable to D/C.  Pt accompanied by: self and caretaker - for drop off/pickup  PERTINENT HISTORY: Pacemaker beginning of January placed, AKI, afib, bradycardia  PAIN:  Are you having pain? No  PRECAUTIONS: Fall and ICD/Pacemaker  RED FLAGS: None   WEIGHT BEARING RESTRICTIONS: No  FALLS: Has patient fallen in last 6 months? No  LIVING  ENVIRONMENT: Lives with: lives with their spouse Lives in: House/apartment Stairs:  House has stairs but has hospital bed on first level Has following equipment at home: Dan Humphreys - 4 wheeled, Wheelchair (manual), shower chair, and Grab bars  PLOF: Requires 24/7 caretaker assistance since March 2024,   PATIENT GOALS: "My objective is to walk better and longer."  OBJECTIVE:   DIAGNOSTIC FINDINGS:   MR  Brain WO Contrast 09/22/2018: IMPRESSION: This MRI of the brain with and without contrast shows multiple T2/flair hyperintense foci in the brainstem, cerebellum and hemispheres in a pattern and configuration consistent with chronic demyelinating plaque associated with multiple sclerosis.  None of the foci appears to be acute.  There are no acute findings and there is a normal enhancement pattern.  There are no definite new lesions compared to the 2016 MRI.  COGNITION: Overall cognitive status: Impaired - Word recall particularly names   TODAY'S TREATMENT:                                                                                                                               Vitals:   07/19/23 1453  BP: (!) 144/67  Pulse: 89    Seated on R arm  TherAct (Discharge goals checked): Vitals assessed as noted above   [facility] PT Assessment - 07/19/23 0001       Standardized Balance Assessment   10 Meter Walk 0.17   m/s with RW + CGA and transport chair follow (SBA)           Transfers with modA and maxA with fatigue at end of session   Gait in regard to goals check and for functional activities during session Gait pattern: decreased step length- Right, decreased hip/knee flexion- Right, decreased ankle dorsiflexion- Right, abducted- Right, and poor foot clearance- Right Distance walked: 167 feet Assistive device utilized: Walker - 2 wheeled with transport chair follow Level of assistance: Min A Comments: decreased RLE clearance, drags limb with hip ER with step to pattern and  increased drag with fatigue, trialed toe cap with minimal improvement  PATIENT EDUCATION: Education details: Continue HEP Person educated: Patient Education method: Explanation, Demonstration, Tactile cues, and Verbal cues Education comprehension: verbalized understanding and returned demonstration  HOME EXERCISE PROGRAM: Access Code: [email or web address] URL: [email or web address] Date: 07/05/2023 Prepared by: Camille Bal  Exercises - Supine Bridge  - 1 x daily - 7 x weekly - 3 sets - 10 reps - Seated Reach Forward, Up, and To Sides  - 1 x daily - 7 x weekly - 3 sets - 10 reps - Seated Trunk Rotation  - 1 x daily - 7 x weekly - 3 sets - 10 reps - Supine Lower Trunk Rotation  - 1 x daily - 7 x weekly - 2 sets - 10 reps  GOALS: Goals reviewed with patient? Yes  SHORT TERM GOALS: Target date: 06/15/2023  Patient will demonstrate 100% compliance with initial HEP to continue to progress between physical therapy sessions.   Baseline: Introductory exercise, pt not doing this, reprinted (10/14) Goal status: NOT MET  2.  Patient will improve gait speed to 0.3 m/s with LRAD or greater to indicate an improvement towards level of limited community ambulator.   Baseline: 0.19 m/s with rollator; 0.21m/sec (10/14) Goal status: ONGOING  3.  Patient will be able to perform sit to stand stand transfer from  transport chair with minA to indicate improved LE strength. Baseline: modA with strong reliance on UE support; ongoing modA w/ staggered UE support (10/14) Goal status: ONGOING  4.  to be assesed/LTG written Baseline: assessed 06/08/23 Goal status: MET   LONG TERM GOALS: Target date: 07/19/2023 (updated to match last scheduled appt within POC)  Patient will report demonstrate independence with final HEP in order to maintain current gains and continue to progress after physical therapy discharge.   Baseline: To be provided, reports feeling confident in final HEP Goal status:  MET  2.  Patient will improve gait speed to 0.4 m/s or greater to indicate an improvement from being a household ambulator to limited community ambulator.   Baseline: 0.19 m/s with rollator; 0.17 m/s with 2WW (SBA) Goal status: NOT MET   3.  Patient will be able to perform sit to stand stand transfer from transport chair with minA to indicate improved LE strength. Baseline: modA with strong reliance on UE support; up to max A needed from transport chair (10/28) Goal status: NOT MET  4.  Pt will ambulate greater than or equal to 100 feet on with LRAD and CGA for improved cardiovascular endurance and BLE strength.  Baseline: 57 ft with RW min A (9/24); improved 167 ft with RW CGA time not captured due (11/4) Goal status: DISCONTINUED due to timer not starting and fatigue not allowing to recreate (due to time seen on anticipate not met)  ASSESSMENT:  CLINICAL IMPRESSION: Emphasis of skilled PT session on finishing LTG goal check. Patient with minimal functional improvements on gait speed and sit to stands. Continues to use rollator instead of 2WW at this time despite recommendation from PT. Patient is compliant with HEP and reports that he tends to perform exercises at night. Given plateau in physical function, patient is being discharged from PT with HEP to continue maintain mobility. Patient does report increased ease getting in and out of bed since starting therapy. Patient in agreement to discharge.   OBJECTIVE IMPAIRMENTS: Abnormal gait, decreased balance, decreased endurance, decreased knowledge of use of DME, decreased mobility, difficulty walking, decreased ROM, increased muscle spasms, impaired tone, and impaired UE functional use.   ACTIVITY LIMITATIONS: carrying, standing, squatting, transfers, dressing, reach over head, and locomotion level  PARTICIPATION LIMITATIONS: meal prep, laundry, community activity, and yard work  PERSONAL FACTORS: Age, Time since onset of  injury/illness/exacerbation, and 1-2 comorbidities: see above  are also affecting patient's functional outcome.   REHAB POTENTIAL: Good  CLINICAL DECISION MAKING: Evolving/moderate complexity  EVALUATION COMPLEXITY: Moderate  PLAN:  Discharged with updated HEP  Maryruth Eve, PT, DPT 07/19/2023, 4:05 PM

## 2023-07-19 NOTE — Progress Notes (Signed)
Remote pacemaker transmission.   

## 2023-07-19 NOTE — Therapy (Signed)
OUTPATIENT OCCUPATIONAL THERAPY NEURO TREATMENT  Patient Name: [name] MRN: [id number] DOB:02/09/1946, 77 y.o., male Today's Date: 07/19/2023  PCP: Eloisa Northern, MD REFERRING PROVIDER: Suanne Marker, MD  END OF SESSION:  OT End of Session - 07/19/23 1514     Visit Number 2    Number of Visits 8   + evaluation   Date for OT Re-Evaluation 09/10/23    Authorization Type Medicare A&B covered @ 100% w/ State [employer]    OT Start Time 1530    OT Stop Time 1615    OT Time Calculation (min) 45 min    Equipment Utilized During Treatment Green Putty, FM objects    Activity Tolerance Patient tolerated treatment well    Behavior During Therapy WFL for tasks assessed/performed             Past Medical History:  Diagnosis Date   Anemia    Aneurysm of aortic arch (HCC) 04/04/2012   Chronic aneurysmal dilatation of aortic arch with chronic type A aortic dissection, s/p replacement of ascending thoracic aorta   Aortic dissection (HCC) 11/05/1998   S/P emergency repair of acute type A aortic dissection with resuspension of native aortic valve   Aortic dissection, thoracic (HCC)    Aortic root aneurysm    Atrial fibrillation (HCC) 01/30/2013   Atrial fibrillation    Bilateral lower extremity edema    BPH (benign prostatic hyperplasia)    CHF (congestive heart failure) (HCC) November [birth year], 1946   2D Echo - EF 50-55%, mild-moderately dilated right ventricle, mild-moderate tricuspid valve regurgitation, moderately dilated right atrium   Depression    Dyslipidemia    Exertional shortness of breath    "for awhile now" (09/11/2013)   Hemorrhoids    History of blood transfusion    Hypertension    Mitral regurgitation 02/20/2013   Multiple sclerosis (HCC)    Neuromuscular disorder (HCC)    Pleural effusion, right, large 01/30/2013   Severe tricuspid valve regurgitation 01/30/2013   Severe tricuspid regurg by recent 2-D echo with a dilated tricuspid annulus, moderate pulmonary hypertension and  biatrial enlargement    Thrombocytopenia (HCC)    Past Surgical History:  Procedure Laterality Date   HEMORRHOID SURGERY  2009   Internal, external hemorrhoidectomy, general anesthesia,prone position. [Other]   Open reduction and internal fixation of right distal radius fracture using [employer] distal radius volar locking plate.  07/2005   Dr Magnus Ivan   PACEMAKER IMPLANT N/A 09/28/2022   Procedure: PACEMAKER IMPLANT;  Surgeon: Marinus Maw, MD;  Location: [facility] INVASIVE CV LAB;  Service: Cardiovascular;  Laterality: N/A;   Repair of acute type A aortic dissection with resuspension of native aortic valve  10/15/1998   Dr Cornelius Moras   TEE WITHOUT CARDIOVERSION N/A 02/17/2013   Procedure: TRANSESOPHAGEAL ECHOCARDIOGRAM (TEE);  Surgeon: Thurmon Fair, MD;  Location: [facility] ENDOSCOPY;  Service: Cardiovascular;  Laterality: N/A;   TEMPORARY PACEMAKER N/A 09/26/2022   Procedure: TEMPORARY PACEMAKER;  Surgeon: Tonny Bollman, MD;  Location: [facility] INVASIVE CV LAB;  Service: Cardiovascular;  Laterality: N/A;   Patient Active Problem List   Diagnosis Date Noted   Bradycardia 09/27/2022   Pacemaker 09/27/2022   Symptomatic bradycardia 09/26/2022   Paroxysmal atrial fibrillation (HCC) 09/26/2022   Essential hypertension 09/26/2022   Acute renal failure superimposed on stage 3b chronic kidney disease (HCC) 09/25/2022   Aortic root aneurysm    H/O aortic dissection 02/13/2016   Aneurysm of aortic arch (HCC) 12/31/2014   Tricuspid regurgitation 10/08/2014   RVF (  right ventricular failure) (HCC) 10/08/2014   Chronic diastolic CHF (congestive heart failure) (HCC) 10/09/2013   Sinus pause 09/16/2013   Anemia due to blood loss, acute in setting of supratheraputic INR 09/11/13 09/12/2013   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 09/12/2013   Acute renal insufficiency 09/12/2013   Anasarca 09/12/2013   Anemia of chronic disease 09/12/2013   Bilateral leg edema 09/11/2013   Acute right-sided  congestive heart failure (HCC) 09/11/2013   Unstable gait 09/11/2013   Dissection of aorta, thoracic- surgery Feb 2000 02/20/2013   Mitral regurgitation 02/20/2013   Lower extremity edema 02/20/2013   Volume overload 01/30/2013   Severe tricuspid valve regurgitation 01/30/2013   Pleural effusion, right, large 01/30/2013   Multiple sclerosis (HCC) 01/13/2013   Thrombocytopenia (HCC) 07/26/2011   Aortic dissection- chronic abd dissection  11/05/1998    ONSET DATE: 06/11/2023  REFERRING DIAG: REFERRING DIAG: G35 (ICD-10-CM) - Multiple sclerosis M62.81 (ICD-10-CM) - Muscle weakness (generalized)  THERAPY DIAG:  Other lack of coordination  Muscle weakness (generalized)  Other symptoms and signs involving the nervous system  Rationale for Evaluation and Treatment: Rehabilitation  SUBJECTIVE:   SUBJECTIVE STATEMENT: Pt seen after his final PT visit today.  He reported that he did get an eye doctor appt in a couple of weeks.   Pt accompanied by: self and Caregiver  (waited in the lobby)  PERTINENT HISTORY: Hx includes afib, BPH, CHF with h/o aortic dissection, depression, ORIF R radial fx 2006, pacemaker implant 09/2022, AKI, afib, bradycardia  MR Brain WO Contrast 09/22/2018: IMPRESSION: This MRI of the brain with and without contrast shows multiple T2/flair hyperintense foci in the brainstem, cerebellum and hemispheres in a pattern and configuration consistent with chronic demyelinating plaque associated with multiple sclerosis.  None of the foci appears to be acute.  There are no acute findings and there is a normal enhancement pattern.  There are no definite new lesions compared to the 2016 MRI.  PRECAUTIONS: Fall  WEIGHT BEARING RESTRICTIONS: No  PAIN:  Are you having pain? No  FALLS: Has patient fallen in last 6 months? No  LIVING ENVIRONMENT: Lives with: lives with an adult companion Lives in: House Stairs:  added ramp in the garage Has following equipment at home:  Dan Humphreys - 2 wheeled, Environmental consultant - 4 wheeled, Wheelchair (manual), shower chair, bed side commode, Grab bars, and Ramped entry  PLOF: Needs assistance with ADLs, Needs assistance with homemaking, Needs assistance with gait, and Needs assistance with transfers - 24 hour care since 12/03/22, Pt drives vehicle with hand controls   PATIENT GOALS: Updated HEP for UEs  OBJECTIVE:  Note: Objective measures were completed at Evaluation unless otherwise noted.  HAND DOMINANCE: Right  ADLs: Overall ADLs: 24/7 caregivers x 4 each day including overnight Transfers/ambulation related to ADLs: supervision Eating: setup Grooming: min to mod assist UB Dressing: mod LB Dressing: max Toileting: urinary incontinence; bowel mod-max assist Bathing: extensive assistance Tub Shower transfers: supervision Equipment: Shower seat with back, Grab bars, Walk in shower, and bed side commode  IADLs: Shopping: accompanies caregivers at times but max assist Light housekeeping: caregivers 24/7 Meal Prep: caregivers 24/7 Community mobility: extensive assistance with transport WC Medication management: caregivers 24/7 Financial management: Ind Handwriting:  NT  MOBILITY STATUS: Needs Assist: transport WC  POSTURE COMMENTS:  rounded shoulders, forward head, decreased lumbar lordosis, and flexed trunk  Sitting balance:  fair in WC  ACTIVITY TOLERANCE: Activity tolerance: fair  FUNCTIONAL OUTCOME MEASURES: Modified Barthel Index for ADLs - 35/100  UPPER EXTREMITY ROM:    Active ROM Right eval Left eval  Shoulder flexion Moves into scaption   Shoulder abduction 90   Shoulder adduction    Shoulder extension    Shoulder internal rotation    Shoulder external rotation    Elbow flexion [facility] WFL  Elbow extension [facility] WFL  Wrist flexion    Wrist extension    Wrist ulnar deviation    Wrist radial deviation    Wrist pronation    Wrist supination    (Blank rows = not tested)  UPPER EXTREMITY MMT:     MMT  Right eval Left eval  Shoulder flexion 4 4  Shoulder abduction    Shoulder adduction    Shoulder extension    Shoulder internal rotation    Shoulder external rotation    Middle trapezius    Lower trapezius    Elbow flexion 4 4  Elbow extension 3 3  Wrist flexion    Wrist extension    Wrist ulnar deviation    Wrist radial deviation    Wrist pronation    Wrist supination    (Blank rows = not tested)  HAND FUNCTION: Grip strength: Right: 33.7, 34.8, 36.1  lbs; Left: 39.9, 46.7, 41.0 lbs Average: Right: 34.9 lbs, Left: 42.5 lbs  COORDINATION: 9 Hole Peg test: Right: 92.54 sec; Left: 3:08.66 sec  SENSATION: WFL  EDEMA: None in UE just R LE  MUSCLE TONE: Stiffness noted.  COGNITION: Overall cognitive status: Self-reported word finding difficulties  VISION: Subjective report: His ophthalmologist retired and hasn't seen a new one yet but he has "amblyopia" (lazy eye) - Previously the L eye got corrected to near 20/20 but needs new prescription  Baseline vision: Bifocals Visual history:  Amblyopia  VISION ASSESSMENT: Not tested  Patient has difficulty with following activities due to following visual impairments: has trouble driving at night esepcially with bright lights  PERCEPTION: Not tested  PRAXIS: Not tested  OBSERVATIONS: Pt arrived in transport [facility] with caregiver present.  Pt reports driving with manual controls and has glasses donned. The pt appears well kept and is pleasant and cooperative throughout evaluation with simple goals to establish HEP for home carryover.   TODAY'S TREATMENT:                                                                                                                               Initiated Putty Activities with green putty to begin strengthening, coordination and sensory stimulation of B UEs.  Patient provided visual demonstration, verbal and tactile cues as needed to improve performance of the various exercises/activities  including:   - Putty Squeezes - cues to squeeze putty, passing it back and forth between both hands, into log for use with other exercises and to fold putty in half with 1 hand  - Putty Rolls - encourage to roll putty into logs with sensory stimulation to entire length of hand and fingers    -  Pinch and Pull with Putty - this motion is combined with different pinches (3-Point Pinch, Tip Pinch, Key Pinch) - patient encouraged to combine tripod, pincer and/or key pinch with "pinch and pull" motion of putty pulling away from midline, changing between different pinches and changing different directions to change grip   - Removing Objects from Putty  - encouraged to hide items (coins, marble, dice etc) and use hands to find the objects and dig them out of the putty  - Finger Extension with Putty - engaged in opening fingers in opposition of thumb as well as individual finger/thumb motions with decreased compensatory motions  Introduced Coordination Activities to work on B UE finger ROM, dexterity and isolated movements with demonstration and practice, verbal, visual and tactile cues to    Pick up game pieces as pt has multiple chess boards at home, ie)  To pick up objects one at a time until he gets 2-4+ in his hand and then move item from palm to fingertips to release.     Patient is encourage to take breaks, relax shoulder, minimize compensatory motions and a try different activities throughout the week.   PATIENT EDUCATION: Education details: Putty Activities  Person educated: Patient Education method: Explanation, Demonstration, Tactile cues, Verbal cues, and Handouts Education comprehension: verbalized understanding, returned demonstration, verbal cues required, tactile cues required, and needs further education  HOME EXERCISE PROGRAM: 07/19/23: Putty Activities - Access Code: [email or web address]   GOALS: Goals reviewed with patient? Yes  SHORT TERM GOALS: Target date: 08/09/23  Patient will  demonstrate initial UE HEP with 25% verbal cues or less for proper execution. Baseline: New to outpt OT Goal status: IN Progress  2.  Patient will demonstrate at least 3-5 lbs improvement in B grip strength as needed for improved use and safety with BUEs. Baseline: Average: Right: 34.9 lbs, Left: 42.5 lbs Goal status: IN Progress  3.  Pt will independently recall at least 3 energy conservation techniques as noted in pt instructions for max participation in preferred daily activities.     Baseline: new to outpt OT    Goal status: INITIAL   LONG TERM GOALS: Target date: 09/06/23  Patient will demonstrate updated B HEP with visual printouts only for proper execution.  Baseline: New to outpt OT Goal status: INITIAL  2.  Patient will verbalize understanding of adapted strategies to maximize safety and independence with ADLs/IADLs. Baseline: Modified Barthel Index for ADLs - 35/100 - Severe Dependency Goal status: INITIAL  3.  Patient will complete nine-hole peg with use of B UE with 10+ second improvement. Baseline: Right: 92.54 sec; Left: 3:08.66 sec Goal status: INITIAL  ASSESSMENT:  CLINICAL IMPRESSION: Patient is a 77 y.o. male who was seen today for occupational therapy treatment for multiple sclerosis. Patient introduced to UE coordination and strength activities with putty with good participation with constant cues from OT. Pt will benefit from skilled OT services in the outpatient setting to progress HEP to address strength and coordination deficits as noted in eval to help pt maximize functional skills as able.    PERFORMANCE DEFICITS: in functional skills including ADLs, IADLs, coordination, dexterity, ROM, strength, flexibility, Fine motor control, Gross motor control, decreased knowledge of precautions, decreased knowledge of use of DME, and UE functional use, cognitive skills including memory and thought, and psychosocial skills including coping strategies, environmental  adaptation, and routines and behaviors.   IMPAIRMENTS: are limiting patient from ADLs, IADLs, rest and sleep, leisure, and social participation.   CO-MORBIDITIES: has co-morbidities  such as pacemaker  that affects occupational performance. Patient will benefit from skilled OT to address above impairments and improve overall function.  REHAB POTENTIAL: Fair due to chronicity of disease   PLAN:  OT FREQUENCY: 1x/week  OT DURATION: 8 weeks  PLANNED INTERVENTIONS: 97535 self care/ADL training, 78469 therapeutic exercise, 97530 therapeutic activity, 97112 neuromuscular re-education, [phone] aquatic therapy, energy conservation, coping strategies training, patient/family education, and DME and/or AE instructions  RECOMMENDED OTHER SERVICES: Pt receiving PT services at this time & already receiving 24/7 caregiver support at home  CONSULTED AND AGREED WITH PLAN OF CARE: Patient  PLAN FOR NEXT SESSION:  Progress HEPs - print and progress coordination HEP, review strength (putty) Intro to energy conservation for MS Explore AE needs/options   Victorino Sparrow, OT 07/19/2023, 4:28 PM

## 2023-07-19 NOTE — Telephone Encounter (Signed)
Order a PA and lat CXR for sob and a 2D echo.

## 2023-07-19 NOTE — Patient Instructions (Signed)
Access Code: [email or web address] URL: [email or web address] Date: 07/19/2023 Prepared by: Amada Kingfisher  Exercises - Putty Squeezes  - 1 x daily - 10 reps - Rolling Putty on Table  - 1 x daily - 10 reps - Finger Pinch and Pull with Putty  - 1 x daily - 10 reps - Tip PUSH with Putty  - 1 x daily - 10 reps - 3-Point Pinch with Putty  - 1 x daily - 10 reps - Key Pinch with Putty  - 1 x daily - 10 reps - Finger Extension with Putty  - 1 x daily - 10 reps - Removing Marbles from Putty  - 1 x daily - 10 reps

## 2023-07-21 ENCOUNTER — Other Ambulatory Visit: Payer: Self-pay

## 2023-07-21 DIAGNOSIS — R0602 Shortness of breath: Secondary | ICD-10-CM

## 2023-07-21 NOTE — Telephone Encounter (Signed)
CXR and Echo ordered.

## 2023-07-26 ENCOUNTER — Ambulatory Visit: Payer: Medicare Other | Admitting: Occupational Therapy

## 2023-07-26 DIAGNOSIS — M6281 Muscle weakness (generalized): Secondary | ICD-10-CM | POA: Diagnosis not present

## 2023-07-26 DIAGNOSIS — R278 Other lack of coordination: Secondary | ICD-10-CM

## 2023-07-26 NOTE — Therapy (Signed)
OUTPATIENT OCCUPATIONAL THERAPY NEURO TREATMENT  Patient Name: [name] MRN: [id number] DOB:12-Jul-1946, 77 y.o., male Today's Date: 07/26/2023  PCP: Eloisa Northern, MD REFERRING PROVIDER: Suanne Marker, MD  END OF SESSION:  OT End of Session - 07/26/23 1538     Visit Number 3    Number of Visits 8   + evaluation   Date for OT Re-Evaluation 09/10/23    Authorization Type Medicare A&B covered @ 100% w/ State [employer]    OT Start Time 1535    OT Stop Time 1630    OT Time Calculation (min) 55 min    Equipment Utilized During Treatment FM objects    Activity Tolerance Patient tolerated treatment well    Behavior During Therapy WFL for tasks assessed/performed             Past Medical History:  Diagnosis Date   Anemia    Aneurysm of aortic arch (HCC) 04/04/2012   Chronic aneurysmal dilatation of aortic arch with chronic type A aortic dissection, s/p replacement of ascending thoracic aorta   Aortic dissection (HCC) 11/05/1998   S/P emergency repair of acute type A aortic dissection with resuspension of native aortic valve   Aortic dissection, thoracic (HCC)    Aortic root aneurysm    Atrial fibrillation (HCC) 01/30/2013   Atrial fibrillation    Bilateral lower extremity edema    BPH (benign prostatic hyperplasia)    CHF (congestive heart failure) (HCC) [birth year]-13-47   2D Echo - EF 50-55%, mild-moderately dilated right ventricle, mild-moderate tricuspid valve regurgitation, moderately dilated right atrium   Depression    Dyslipidemia    Exertional shortness of breath    "for awhile now" (09/11/2013)   Hemorrhoids    History of blood transfusion    Hypertension    Mitral regurgitation 02/20/2013   Multiple sclerosis (HCC)    Neuromuscular disorder (HCC)    Pleural effusion, right, large 01/30/2013   Severe tricuspid valve regurgitation 01/30/2013   Severe tricuspid regurg by recent 2-D echo with a dilated tricuspid annulus, moderate pulmonary hypertension and biatrial  enlargement    Thrombocytopenia (HCC)    Past Surgical History:  Procedure Laterality Date   HEMORRHOID SURGERY  2009   Internal, external hemorrhoidectomy, general anesthesia,prone position. [Other]   Open reduction and internal fixation of right distal radius fracture using [employer] distal radius volar locking plate.  07/2005   Dr Magnus Ivan   PACEMAKER IMPLANT N/A 09/28/2022   Procedure: PACEMAKER IMPLANT;  Surgeon: Marinus Maw, MD;  Location: [facility] INVASIVE CV LAB;  Service: Cardiovascular;  Laterality: N/A;   Repair of acute type A aortic dissection with resuspension of native aortic valve  10/15/1998   Dr Cornelius Moras   TEE WITHOUT CARDIOVERSION N/A 02/17/2013   Procedure: TRANSESOPHAGEAL ECHOCARDIOGRAM (TEE);  Surgeon: Thurmon Fair, MD;  Location[facility] ENDOSCOPY;  Service: Cardiovascular;  Laterality: N/A;   TEMPORARY PACEMAKER N/A 09/26/2022   Procedure: TEMPORARY PACEMAKER;  Surgeon: Tonny Bollman, MD;  Location: [facility] INVASIVE CV LAB;  Service: Cardiovascular;  Laterality: N/A;   Patient Active Problem List   Diagnosis Date Noted   Bradycardia 09/27/2022   Pacemaker 09/27/2022   Symptomatic bradycardia 09/26/2022   Paroxysmal atrial fibrillation (HCC) 09/26/2022   Essential hypertension 09/26/2022   Acute renal failure superimposed on stage 3b chronic kidney disease (HCC) 09/25/2022   Aortic root aneurysm    H/O aortic dissection 02/13/2016   Aneurysm of aortic arch (HCC) 12/31/2014   Tricuspid regurgitation 10/08/2014   RVF (right ventricular  failure) (HCC) 10/08/2014   Chronic diastolic CHF (congestive heart failure) (HCC) 10/09/2013   Sinus pause 09/16/2013   Anemia due to blood loss, acute in setting of supratheraputic INR 09/11/13 09/12/2013   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 09/12/2013   Acute renal insufficiency 09/12/2013   Anasarca 09/12/2013   Anemia of chronic disease 09/12/2013   Bilateral leg edema 09/11/2013   Acute right-sided congestive  heart failure (HCC) 09/11/2013   Unstable gait 09/11/2013   Dissection of aorta, thoracic- surgery Feb 2000 02/20/2013   Mitral regurgitation 02/20/2013   Lower extremity edema 02/20/2013   Volume overload 01/30/2013   Severe tricuspid valve regurgitation 01/30/2013   Pleural effusion, right, large 01/30/2013   Multiple sclerosis (HCC) 01/13/2013   Thrombocytopenia (HCC) 07/26/2011   Aortic dissection- chronic abd dissection  11/05/1998    ONSET DATE: 06/11/2023  REFERRING DIAG: REFERRING DIAG: G35 (ICD-10-CM) - Multiple sclerosis M62.81 (ICD-10-CM) - Muscle weakness (generalized)  THERAPY DIAG:  Other lack of coordination  Muscle weakness (generalized)  Rationale for Evaluation and Treatment: Rehabilitation  SUBJECTIVE:   SUBJECTIVE STATEMENT: Pt reported that he hasn't done much with his putty this week.  Upon review of HEP ideas, he hasn't done many PT exercises either.   Pt accompanied by: self and Caregiver  (waited in the lobby)  PERTINENT HISTORY: Hx includes afib, BPH, CHF with h/o aortic dissection, depression, ORIF R radial fx 2006, pacemaker implant 09/2022, AKI, afib, bradycardia  MR Brain WO Contrast 09/22/2018: IMPRESSION: This MRI of the brain with and without contrast shows multiple T2/flair hyperintense foci in the brainstem, cerebellum and hemispheres in a pattern and configuration consistent with chronic demyelinating plaque associated with multiple sclerosis.  None of the foci appears to be acute.  There are no acute findings and there is a normal enhancement pattern.  There are no definite new lesions compared to the 2016 MRI.  PRECAUTIONS: Fall  WEIGHT BEARING RESTRICTIONS: No  PAIN:  Are you having pain? No  FALLS: Has patient fallen in last 6 months? No  LIVING ENVIRONMENT: Lives with: lives with an adult companion Lives in: House Stairs:  added ramp in the garage Has following equipment at home: Dan Humphreys - 2 wheeled, Environmental consultant - 4 wheeled,  Wheelchair (manual), shower chair, bed side commode, Grab bars, and Ramped entry  PLOF: Needs assistance with ADLs, Needs assistance with homemaking, Needs assistance with gait, and Needs assistance with transfers - 24 hour care since 12/03/22, Pt drives vehicle with hand controls   PATIENT GOALS: Updated HEP for UEs  OBJECTIVE:  Note: Objective measures were completed at Evaluation unless otherwise noted.  HAND DOMINANCE: Right  ADLs: Overall ADLs: 24/7 caregivers x 4 each day including overnight Transfers/ambulation related to ADLs: supervision Eating: setup Grooming: min to mod assist UB Dressing: mod LB Dressing: max Toileting: urinary incontinence; bowel mod-max assist Bathing: extensive assistance Tub Shower transfers: supervision Equipment: Shower seat with back, Grab bars, Walk in shower, and bed side commode  IADLs: Shopping: accompanies caregivers at times but max assist Light housekeeping: caregivers 24/7 Meal Prep: caregivers 24/7 Community mobility: extensive assistance with transport WC Medication management: caregivers 24/7 Financial management: Ind Handwriting:  NT  MOBILITY STATUS: Needs Assist: transport WC  POSTURE COMMENTS:  rounded shoulders, forward head, decreased lumbar lordosis, and flexed trunk  Sitting balance:  fair in WC  ACTIVITY TOLERANCE: Activity tolerance: fair  FUNCTIONAL OUTCOME MEASURES: Modified Barthel Index for ADLs - 35/100  UPPER EXTREMITY ROM:    Active ROM Right  eval Left eval  Shoulder flexion Moves into scaption   Shoulder abduction 90   Shoulder adduction    Shoulder extension    Shoulder internal rotation    Shoulder external rotation    Elbow flexion [facility] WFL  Elbow extension [facility] WFL  Wrist flexion    Wrist extension    Wrist ulnar deviation    Wrist radial deviation    Wrist pronation    Wrist supination    (Blank rows = not tested)  UPPER EXTREMITY MMT:     MMT Right eval Left eval  Shoulder  flexion 4 4  Shoulder abduction    Shoulder adduction    Shoulder extension    Shoulder internal rotation    Shoulder external rotation    Middle trapezius    Lower trapezius    Elbow flexion 4 4  Elbow extension 3 3  Wrist flexion    Wrist extension    Wrist ulnar deviation    Wrist radial deviation    Wrist pronation    Wrist supination    (Blank rows = not tested)  HAND FUNCTION: Grip strength: Right: 33.7, 34.8, 36.1  lbs; Left: 39.9, 46.7, 41.0 lbs Average: Right: 34.9 lbs, Left: 42.5 lbs  COORDINATION: 9 Hole Peg test: Right: 92.54 sec; Left: 3:08.66 sec  SENSATION: WFL  EDEMA: None in UE just R LE  MUSCLE TONE: Stiffness noted.  COGNITION: Overall cognitive status: Self-reported word finding difficulties  VISION: Subjective report: His ophthalmologist retired and hasn't seen a new one yet but he has "amblyopia" (lazy eye) - Previously the L eye got corrected to near 20/20 but needs new prescription  Baseline vision: Bifocals Visual history:  Amblyopia  VISION ASSESSMENT: Not tested  Patient has difficulty with following activities due to following visual impairments: has trouble driving at night esepcially with bright lights  PERCEPTION: Not tested  PRAXIS: Not tested  OBSERVATIONS: Pt arrived in transport [facility] with caregiver present.  Pt reports driving with manual controls and has glasses donned. The pt appears well kept and is pleasant and cooperative throughout evaluation with simple goals to establish HEP for home carryover.   TODAY'S TREATMENT:                                                                                                                                Resumed Coordination Activity education with handout with images provided (worked on odd number tasks) along with extensive demonstration, modification and options provided for activities to work on BUE finger ROM, dexterity and isolated movements.  OTR provided various demonstrations  and had pt practice with modification and cues provided throughout to improve technique, digital isolation and ease of performing task.    Rotate ball in fingertips (clockwise and counter-clockwise) to find an image and also worked on Production designer, theatre/television/film balls in hand or on table top.     Pick up coins, dominoes, checkers, chess game pieces etc of different  sizes ... To place in containers (with and without small slot) To stack - with guidance to work on include/isolate specific fingers. To pick up items one at a time until he gets 5+ in his hand and then move item from palm to fingertips to release.   - Options to vary difficulty include using a washcloth under items like coins or using larger items (checkers vs coins or blocks/dominos vs dice) for increased ease of picking up items.   Patient is encourage to take breaks, relax shoulder, minimize compensatory motions and a try different activities throughout the week.   Pt also provided a printed exercises sheet with list of PT and OT activities to work on ie) walking, leg exercises and bed exercises for PT and putty, coordination and arm exercises for OT.  He is encouraged to leave images at places to work on task ie) at bedside, at arm chair/recliner and dining room table.  He is cued to complete a few exercises at commercials throughout the day, walk to/form the dining room table etc.   PATIENT EDUCATION: Education details: Coordination Activities  Person educated: Patient Education method: Explanation, Demonstration, Tactile cues, Verbal cues, and Handouts Education comprehension: verbalized understanding, returned demonstration, verbal cues required, tactile cues required, and needs further education  HOME EXERCISE PROGRAM: 07/19/23: Putty Activities - Access Code: [email or web address] 07/16/23: Coordination Activities    GOALS: Goals reviewed with patient? Yes  SHORT TERM GOALS: Target date: 08/09/23  Patient will demonstrate initial UE HEP with 25%  verbal cues or less for proper execution. Baseline: New to outpt OT Goal status: IN Progress  2.  Patient will demonstrate at least 3-5 lbs improvement in B grip strength as needed for improved use and safety with BUEs. Baseline: Average: Right: 34.9 lbs, Left: 42.5 lbs Goal status: IN Progress  3.  Pt will independently recall at least 3 energy conservation techniques as noted in pt instructions for max participation in preferred daily activities.     Baseline: new to outpt OT    Goal status: INITIAL   LONG TERM GOALS: Target date: 09/06/23  Patient will demonstrate updated B HEP with visual printouts only for proper execution.  Baseline: New to outpt OT Goal status: IN Progress  2.  Patient will verbalize understanding of adapted strategies to maximize safety and independence with ADLs/IADLs. Baseline: Modified Barthel Index for ADLs - 35/100 - Severe Dependency Goal status: INITIAL  3.  Patient will complete nine-hole peg with use of B UE with 10+ second improvement. Baseline: Right: 92.54 sec; Left: 3:08.66 sec Goal status: IN Progress  ASSESSMENT:  CLINICAL IMPRESSION: Patient is a 77 y.o. male who was seen today for occupational therapy treatment for multiple sclerosis. Patient reviewed UE coordination activities extensively today for HEP program with printed HEP calendar provided to work on various activities at home. Pt will benefit from skilled OT services in the outpatient setting to progress HEP to address strength and coordination deficits as noted in eval to help pt maximize functional skills as able.    PERFORMANCE DEFICITS: in functional skills including ADLs, IADLs, coordination, dexterity, ROM, strength, flexibility, Fine motor control, Gross motor control, decreased knowledge of precautions, decreased knowledge of use of DME, and UE functional use, cognitive skills including memory and thought, and psychosocial skills including coping strategies, environmental  adaptation, and routines and behaviors.   IMPAIRMENTS: are limiting patient from ADLs, IADLs, rest and sleep, leisure, and social participation.   CO-MORBIDITIES: has co-morbidities such as pacemaker  that affects  occupational performance. Patient will benefit from skilled OT to address above impairments and improve overall function.  REHAB POTENTIAL: Fair due to chronicity of disease   PLAN:  OT FREQUENCY: 1x/week  OT DURATION: 8 weeks  PLANNED INTERVENTIONS: 97535 self care/ADL training, 60454 therapeutic exercise, 97530 therapeutic activity, 97112 neuromuscular re-education, [phone] aquatic therapy, energy conservation, coping strategies training, patient/family education, and DME and/or AE instructions  RECOMMENDED OTHER SERVICES: Pt receiving PT services at this time & already receiving 24/7 caregiver support at home  CONSULTED AND AGREED WITH PLAN OF CARE: Patient  PLAN FOR NEXT SESSION:   "Lifting Cans" ROM program - per pt recall of previous HEP Progress HEPs - review and progress coordination HEP, review strength (putty) Intro to energy conservation for MS Explore AE needs/options for ADLs   Victorino Sparrow, OT 07/26/2023, 5:44 PM

## 2023-07-26 NOTE — Patient Instructions (Addendum)
 (  Exercise) Monday Tuesday Wednesday Thursday Friday Saturday Sunday   Walking           Leg Exercises - sitting           Bed Exercises                      Putty - OT           Coordination - OT           Arm Exercises

## 2023-08-02 ENCOUNTER — Ambulatory Visit: Payer: Medicare Other | Admitting: Occupational Therapy

## 2023-08-02 DIAGNOSIS — R29898 Other symptoms and signs involving the musculoskeletal system: Secondary | ICD-10-CM

## 2023-08-02 DIAGNOSIS — R29818 Other symptoms and signs involving the nervous system: Secondary | ICD-10-CM

## 2023-08-02 DIAGNOSIS — M6281 Muscle weakness (generalized): Secondary | ICD-10-CM | POA: Diagnosis not present

## 2023-08-02 DIAGNOSIS — R278 Other lack of coordination: Secondary | ICD-10-CM

## 2023-08-02 NOTE — Patient Instructions (Signed)
-   Armchair Push Up  - 3 x daily - 2-5 reps  Access Code: [email or web address] URL: [email or web address] Date: 08/02/2023 Prepared by: Amada Kingfisher

## 2023-08-02 NOTE — Therapy (Unsigned)
OUTPATIENT OCCUPATIONAL THERAPY NEURO TREATMENT  Patient Name: [name] MRN: [id number] DOB:1945/12/24, 77 y.o., male Today's Date: 08/02/2023  PCP: Eloisa Northern, MD REFERRING PROVIDER: Suanne Marker, MD  END OF SESSION:  OT End of Session - 08/02/23 1538     Visit Number 4    Number of Visits 8   + evaluation   Date for OT Re-Evaluation 09/10/23    Authorization Type Medicare A&B covered @ 100% w/ State [employer]    OT Start Time 1537    OT Stop Time 1622    OT Time Calculation (min) 45 min    Equipment Utilized During Treatment FM objects    Activity Tolerance Patient tolerated treatment well    Behavior During Therapy WFL for tasks assessed/performed             Past Medical History:  Diagnosis Date   Anemia    Aneurysm of aortic arch (HCC) 04/04/2012   Chronic aneurysmal dilatation of aortic arch with chronic type A aortic dissection, s/p replacement of ascending thoracic aorta   Aortic dissection (HCC) 11/05/1998   S/P emergency repair of acute type A aortic dissection with resuspension of native aortic valve   Aortic dissection, thoracic (HCC)    Aortic root aneurysm    Atrial fibrillation (HCC) 01/30/2013   Atrial fibrillation    Bilateral lower extremity edema    BPH (benign prostatic hyperplasia)    CHF (congestive heart failure) (HCC) Feb 27, 1946   2D Echo - EF 50-55%, mild-moderately dilated right ventricle, mild-moderate tricuspid valve regurgitation, moderately dilated right atrium   Depression    Dyslipidemia    Exertional shortness of breath    "for awhile now" (09/11/2013)   Hemorrhoids    History of blood transfusion    Hypertension    Mitral regurgitation 02/20/2013   Multiple sclerosis (HCC)    Neuromuscular disorder (HCC)    Pleural effusion, right, large 01/30/2013   Severe tricuspid valve regurgitation 01/30/2013   Severe tricuspid regurg by recent 2-D echo with a dilated tricuspid annulus, moderate pulmonary hypertension and biatrial  enlargement    Thrombocytopenia (HCC)    Past Surgical History:  Procedure Laterality Date   HEMORRHOID SURGERY  2009   Internal, external hemorrhoidectomy, general anesthesia,prone position. [Other]   Open reduction and internal fixation of right distal radius fracture using [employer] distal radius volar locking plate.  07/2005   Dr Magnus Ivan   PACEMAKER IMPLANT N/A 09/28/2022   Procedure: PACEMAKER IMPLANT;  Surgeon: Marinus Maw, MD;  Location[facility] INVASIVE CV LAB;  Service: Cardiovascular;  Laterality: N/A;   Repair of acute type A aortic dissection with resuspension of native aortic valve  10/15/1998   Dr Cornelius Moras   TEE WITHOUT CARDIOVERSION N/A 02/17/2013   Procedure: TRANSESOPHAGEAL ECHOCARDIOGRAM (TEE);  Surgeon: Thurmon Fair, MD;  Location: [facility] ENDOSCOPY;  Service: Cardiovascular;  Laterality: N/A;   TEMPORARY PACEMAKER N/A 09/26/2022   Procedure: TEMPORARY PACEMAKER;  Surgeon: Tonny Bollman, MD;  Location: [facility] INVASIVE CV LAB;  Service: Cardiovascular;  Laterality: N/A;   Patient Active Problem List   Diagnosis Date Noted   Bradycardia 09/27/2022   Pacemaker 09/27/2022   Symptomatic bradycardia 09/26/2022   Paroxysmal atrial fibrillation (HCC) 09/26/2022   Essential hypertension 09/26/2022   Acute renal failure superimposed on stage 3b chronic kidney disease (HCC) 09/25/2022   Aortic root aneurysm    H/O aortic dissection 02/13/2016   Aneurysm of aortic arch (HCC) 12/31/2014   Tricuspid regurgitation 10/08/2014   RVF (right ventricular  failure) (HCC) 10/08/2014   Chronic diastolic CHF (congestive heart failure) (HCC) 10/09/2013   Sinus pause 09/16/2013   Anemia due to blood loss, acute in setting of supratheraputic INR 09/11/13 09/12/2013   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 09/12/2013   Acute renal insufficiency 09/12/2013   Anasarca 09/12/2013   Anemia of chronic disease 09/12/2013   Bilateral leg edema 09/11/2013   Acute right-sided congestive  heart failure (HCC) 09/11/2013   Unstable gait 09/11/2013   Dissection of aorta, thoracic- surgery Feb 2000 02/20/2013   Mitral regurgitation 02/20/2013   Lower extremity edema 02/20/2013   Volume overload 01/30/2013   Severe tricuspid valve regurgitation 01/30/2013   Pleural effusion, right, large 01/30/2013   Multiple sclerosis (HCC) 01/13/2013   Thrombocytopenia (HCC) 07/26/2011   Aortic dissection- chronic abd dissection  11/05/1998    ONSET DATE: 06/11/2023  REFERRING DIAG: REFERRING DIAG: G35 (ICD-10-CM) - Multiple sclerosis M62.81 (ICD-10-CM) - Muscle weakness (generalized)  THERAPY DIAG:  Muscle weakness (generalized)  Other lack of coordination  Other symptoms and signs involving the nervous system  Other symptoms and signs involving the musculoskeletal system  Rationale for Evaluation and Treatment: Rehabilitation  SUBJECTIVE:   SUBJECTIVE STATEMENT: Pt reported that he just left the eye doctor and his eyes were dilated.  Upon review of HEP ideas, he hasn't done many PT exercises either.   Walking to his mailbox this week.  Playing with the clay, exercising while sitting.   Pt accompanied by: self   PERTINENT HISTORY: Hx includes afib, BPH, CHF with h/o aortic dissection, depression, ORIF R radial fx 2006, pacemaker implant 09/2022, AKI, afib, bradycardia  MR Brain WO Contrast 09/22/2018: IMPRESSION: This MRI of the brain with and without contrast shows multiple T2/flair hyperintense foci in the brainstem, cerebellum and hemispheres in a pattern and configuration consistent with chronic demyelinating plaque associated with multiple sclerosis.  None of the foci appears to be acute.  There are no acute findings and there is a normal enhancement pattern.  There are no definite new lesions compared to the 2016 MRI.  PRECAUTIONS: Fall  WEIGHT BEARING RESTRICTIONS: No  PAIN:  Are you having pain? No  FALLS: Has patient fallen in last 6 months? No  LIVING  ENVIRONMENT: Lives with: lives with an adult companion Lives in: House Stairs:  added ramp in the garage Has following equipment at home: Dan Humphreys - 2 wheeled, Environmental consultant - 4 wheeled, Wheelchair (manual), shower chair, bed side commode, Grab bars, and Ramped entry  PLOF: Needs assistance with ADLs, Needs assistance with homemaking, Needs assistance with gait, and Needs assistance with transfers - 24 hour care since 12/03/22, Pt drives vehicle with hand controls   PATIENT GOALS: Updated HEP for UEs  OBJECTIVE:  Note: Objective measures were completed at Evaluation unless otherwise noted.  HAND DOMINANCE: Right  ADLs: Overall ADLs: 24/7 caregivers x 4 each day including overnight Transfers/ambulation related to ADLs: supervision Eating: setup Grooming: min to mod assist UB Dressing: mod LB Dressing: max Toileting: urinary incontinence; bowel mod-max assist Bathing: extensive assistance Tub Shower transfers: supervision Equipment: Shower seat with back, Grab bars, Walk in shower, and bed side commode  IADLs: Shopping: accompanies caregivers at times but max assist Light housekeeping: caregivers 24/7 Meal Prep: caregivers 24/7 Community mobility: extensive assistance with transport WC Medication management: caregivers 24/7 Financial management: Ind Handwriting:  NT  MOBILITY STATUS: Needs Assist: transport WC  POSTURE COMMENTS:  rounded shoulders, forward head, decreased lumbar lordosis, and flexed trunk  Sitting balance:  fair in WC  ACTIVITY TOLERANCE: Activity tolerance: fair  FUNCTIONAL OUTCOME MEASURES: Modified Barthel Index for ADLs - 35/100  UPPER EXTREMITY ROM:    Active ROM Right eval Left eval  Shoulder flexion Moves into scaption   Shoulder abduction 90   Shoulder adduction    Shoulder extension    Shoulder internal rotation    Shoulder external rotation    Elbow flexion [facility] WFL  Elbow extension [facility] WFL  Wrist flexion    Wrist extension    Wrist ulnar  deviation    Wrist radial deviation    Wrist pronation    Wrist supination    (Blank rows = not tested)  UPPER EXTREMITY MMT:     MMT Right eval Left eval  Shoulder flexion 4 4  Shoulder abduction    Shoulder adduction    Shoulder extension    Shoulder internal rotation    Shoulder external rotation    Middle trapezius    Lower trapezius    Elbow flexion 4 4  Elbow extension 3 3  Wrist flexion    Wrist extension    Wrist ulnar deviation    Wrist radial deviation    Wrist pronation    Wrist supination    (Blank rows = not tested)  HAND FUNCTION: Grip strength: Right: 33.7, 34.8, 36.1  lbs; Left: 39.9, 46.7, 41.0 lbs Average: Right: 34.9 lbs, Left: 42.5 lbs  COORDINATION: 9 Hole Peg test: Right: 92.54 sec; Left: 3:08.66 sec  SENSATION: WFL  EDEMA: None in UE just R LE  MUSCLE TONE: Stiffness noted.  COGNITION: Overall cognitive status: Self-reported word finding difficulties  VISION: Subjective report: His ophthalmologist retired and hasn't seen a new one yet but he has "amblyopia" (lazy eye) - Previously the L eye got corrected to near 20/20 but needs new prescription  Baseline vision: Bifocals Visual history:  Amblyopia  VISION ASSESSMENT: Not tested  Patient has difficulty with following activities due to following visual impairments: has trouble driving at night esepcially with bright lights  PERCEPTION: Not tested  PRAXIS: Not tested  OBSERVATIONS: Pt arrived in transport [facility] with caregiver present.  Pt reports driving with manual controls and has glasses donned. The pt appears well kept and is pleasant and cooperative throughout evaluation with simple goals to establish HEP for home carryover.   TODAY'S TREATMENT:                                                                                                                                Resumed Coordination Activity education with handout with images provided (worked on odd number tasks) along  with extensive demonstration, modification and options provided for activities to work on BUE finger ROM, dexterity and isolated movements.  OTR provided various demonstrations and had pt practice with modification and cues provided throughout to improve technique, digital isolation and ease of performing task.    Rotate ball in fingertips (clockwise and counter-clockwise) to find  an image and also worked on Production designer, theatre/television/film balls in hand or on table top.     Pick up coins, dominoes, checkers, chess game pieces etc of different sizes ... To place in containers (with and without small slot) To stack - with guidance to work on include/isolate specific fingers. To pick up items one at a time until he gets 5+ in his hand and then move item from palm to fingertips to release.   - Options to vary difficulty include using a washcloth under items like coins or using larger items (checkers vs coins or blocks/dominos vs dice) for increased ease of picking up items.   Patient is encourage to take breaks, relax shoulder, minimize compensatory motions and a try different activities throughout the week.   Pt also provided a printed exercises sheet with list of PT and OT activities to work on ie) walking, leg exercises and bed exercises for PT and putty, coordination and arm exercises for OT.  He is encouraged to leave images at places to work on task ie) at bedside, at arm chair/recliner and dining room table.  He is cued to complete a few exercises at commercials throughout the day, walk to/form the dining room table etc.   PATIENT EDUCATION: Education details: Coordination Activities  Person educated: Patient Education method: Explanation, Demonstration, Tactile cues, Verbal cues, and Handouts Education comprehension: verbalized understanding, returned demonstration, verbal cues required, tactile cues required, and needs further education  HOME EXERCISE PROGRAM: 07/19/23: Putty Activities - Access Code:  [email or web address] 07/16/23: Coordination Activities    GOALS: Goals reviewed with patient? Yes  SHORT TERM GOALS: Target date: 08/09/23  Patient will demonstrate initial UE HEP with 25% verbal cues or less for proper execution. Baseline: New to outpt OT Goal status: IN Progress  2.  Patient will demonstrate at least 3-5 lbs improvement in B grip strength as needed for improved use and safety with BUEs. Baseline: Average: Right: 34.9 lbs, Left: 42.5 lbs Goal status: IN Progress  3.  Pt will independently recall at least 3 energy conservation techniques as noted in pt instructions for max participation in preferred daily activities.     Baseline: new to outpt OT    Goal status: INITIAL   LONG TERM GOALS: Target date: 09/06/23  Patient will demonstrate updated B HEP with visual printouts only for proper execution.  Baseline: New to outpt OT Goal status: IN Progress  2.  Patient will verbalize understanding of adapted strategies to maximize safety and independence with ADLs/IADLs. Baseline: Modified Barthel Index for ADLs - 35/100 - Severe Dependency Goal status: INITIAL  3.  Patient will complete nine-hole peg with use of B UE with 10+ second improvement. Baseline: Right: 92.54 sec; Left: 3:08.66 sec Goal status: IN Progress  ASSESSMENT:  CLINICAL IMPRESSION: Patient is a 77 y.o. male who was seen today for occupational therapy treatment for multiple sclerosis. Patient reviewed UE coordination activities extensively today for HEP program with printed HEP calendar provided to work on various activities at home. Pt will benefit from skilled OT services in the outpatient setting to progress HEP to address strength and coordination deficits as noted in eval to help pt maximize functional skills as able.    PERFORMANCE DEFICITS: in functional skills including ADLs, IADLs, coordination, dexterity, ROM, strength, flexibility, Fine motor control, Gross motor control, decreased knowledge of  precautions, decreased knowledge of use of DME, and UE functional use, cognitive skills including memory and thought, and psychosocial skills including coping strategies, environmental adaptation, and  routines and behaviors.   IMPAIRMENTS: are limiting patient from ADLs, IADLs, rest and sleep, leisure, and social participation.   CO-MORBIDITIES: has co-morbidities such as pacemaker  that affects occupational performance. Patient will benefit from skilled OT to address above impairments and improve overall function.  REHAB POTENTIAL: Fair due to chronicity of disease   PLAN:  OT FREQUENCY: 1x/week  OT DURATION: 8 weeks  PLANNED INTERVENTIONS: 97535 self care/ADL training, 86578 therapeutic exercise, 97530 therapeutic activity, 97112 neuromuscular re-education, [phone] aquatic therapy, energy conservation, coping strategies training, patient/family education, and DME and/or AE instructions  RECOMMENDED OTHER SERVICES: Pt receiving PT services at this time & already receiving 24/7 caregiver support at home  CONSULTED AND AGREED WITH PLAN OF CARE: Patient  PLAN FOR NEXT SESSION:   "Lifting Cans" ROM program - per pt recall of previous HEP Progress HEPs - review and progress coordination HEP, review strength (putty) Intro to energy conservation for MS Explore AE needs/options for ADLs   Victorino Sparrow, OT 08/02/2023, 5:04 PM

## 2023-08-09 ENCOUNTER — Ambulatory Visit: Payer: Medicare Other | Admitting: Occupational Therapy

## 2023-08-10 ENCOUNTER — Ambulatory Visit: Payer: Medicare Other | Admitting: Occupational Therapy

## 2023-08-10 DIAGNOSIS — R293 Abnormal posture: Secondary | ICD-10-CM

## 2023-08-10 DIAGNOSIS — M6281 Muscle weakness (generalized): Secondary | ICD-10-CM | POA: Diagnosis not present

## 2023-08-10 DIAGNOSIS — R278 Other lack of coordination: Secondary | ICD-10-CM

## 2023-08-10 NOTE — Patient Instructions (Signed)
Access Code: [email or web address] URL: [email or web address] Date: 08/10/2023 Prepared by: Amada Kingfisher  Added Exercises to HEP  - Seated Shoulder Horizontal Abduction with Resistance  - 1 x daily - 10 reps - Seated Shoulder Flexion with Self-Anchored Resistance  - 1 x daily - 10 reps - Seated Elbow Flexion and Extension AROM  - 1 x daily - 10 reps - Seated Shoulder Flexion AAROM with Dowel  - 1 x daily - 10 reps

## 2023-08-10 NOTE — Therapy (Unsigned)
OUTPATIENT OCCUPATIONAL THERAPY NEURO TREATMENT  Patient Name: [name] MRN: [id number] DOB:06-27-1946, 77 y.o., male Today's Date: 08/10/2023  PCP: Eloisa Northern, MD REFERRING PROVIDER: Suanne Marker, MD  END OF SESSION:  OT End of Session - 08/10/23 1453     Visit Number 5    Number of Visits 8   + evaluation   Date for OT Re-Evaluation 09/10/23    Authorization Type Medicare A&B covered @ 100% w/ State [employer]    OT Start Time 1450    OT Stop Time 1535    OT Time Calculation (min) 45 min    Equipment Utilized During Treatment Red theraband    Activity Tolerance Patient tolerated treatment well    Behavior During Therapy WFL for tasks assessed/performed             Past Medical History:  Diagnosis Date   Anemia    Aneurysm of aortic arch (HCC) 04/04/2012   Chronic aneurysmal dilatation of aortic arch with chronic type A aortic dissection, s/p replacement of ascending thoracic aorta   Aortic dissection (HCC) 11/05/1998   S/P emergency repair of acute type A aortic dissection with resuspension of native aortic valve   Aortic dissection, thoracic (HCC)    Aortic root aneurysm    Atrial fibrillation (HCC) 01/30/2013   Atrial fibrillation    Bilateral lower extremity edema    BPH (benign prostatic hyperplasia)    CHF (congestive heart failure) (HCC) 12-28-1945   2D Echo - EF 50-55%, mild-moderately dilated right ventricle, mild-moderate tricuspid valve regurgitation, moderately dilated right atrium   Depression    Dyslipidemia    Exertional shortness of breath    "for awhile now" (09/11/2013)   Hemorrhoids    History of blood transfusion    Hypertension    Mitral regurgitation 02/20/2013   Multiple sclerosis (HCC)    Neuromuscular disorder (HCC)    Pleural effusion, right, large 01/30/2013   Severe tricuspid valve regurgitation 01/30/2013   Severe tricuspid regurg by recent 2-D echo with a dilated tricuspid annulus, moderate pulmonary hypertension and biatrial  enlargement    Thrombocytopenia [facility])    Past Surgical History:  Procedure Laterality Date   HEMORRHOID SURGERY  2009   Internal, external hemorrhoidectomy, general anesthesia,prone position. [Other]   Open reduction and internal fixation of right distal radius fracture using [employer] distal radius volar locking plate.  07/2005   Dr Magnus Ivan   PACEMAKER IMPLANT N/A 09/28/2022   Procedure: PACEMAKER IMPLANT;  Surgeon: Marinus Maw, MD;  Location: [facility] INVASIVE CV LAB;  Service: Cardiovascular;  Laterality: N/A;   Repair of acute type A aortic dissection with resuspension of native aortic valve  10/15/1998   Dr Cornelius Moras   TEE WITHOUT CARDIOVERSION N/A 02/17/2013   Procedure: TRANSESOPHAGEAL ECHOCARDIOGRAM (TEE);  Surgeon: Thurmon Fair, MD;  Location: [facility] ENDOSCOPY;  Service: Cardiovascular;  Laterality: N/A;   TEMPORARY PACEMAKER N/A 09/26/2022   Procedure: TEMPORARY PACEMAKER;  Surgeon: Tonny Bollman, MD;  Location: [facility] INVASIVE CV LAB;  Service: Cardiovascular;  Laterality: N/A;   Patient Active Problem List   Diagnosis Date Noted   Bradycardia 09/27/2022   Pacemaker 09/27/2022   Symptomatic bradycardia 09/26/2022   Paroxysmal atrial fibrillation (HCC) 09/26/2022   Essential hypertension 09/26/2022   Acute renal failure superimposed on stage 3b chronic kidney disease (HCC) 09/25/2022   Aortic root aneurysm    H/O aortic dissection 02/13/2016   Aneurysm of aortic arch (HCC) 12/31/2014   Tricuspid regurgitation 10/08/2014   RVF (right ventricular  failure) (HCC) 10/08/2014   Chronic diastolic CHF (congestive heart failure) (HCC) 10/09/2013   Sinus pause 09/16/2013   Anemia due to blood loss, acute in setting of supratheraputic INR 09/11/13 09/12/2013   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 09/12/2013   Acute renal insufficiency 09/12/2013   Anasarca 09/12/2013   Anemia of chronic disease 09/12/2013   Bilateral leg edema 09/11/2013   Acute right-sided congestive  heart failure (HCC) 09/11/2013   Unstable gait 09/11/2013   Dissection of aorta, thoracic- surgery Feb 2000 02/20/2013   Mitral regurgitation 02/20/2013   Lower extremity edema 02/20/2013   Volume overload 01/30/2013   Severe tricuspid valve regurgitation 01/30/2013   Pleural effusion, right, large 01/30/2013   Multiple sclerosis (HCC) 01/13/2013   Thrombocytopenia (HCC) 07/26/2011   Aortic dissection- chronic abd dissection  11/05/1998    ONSET DATE: 06/11/2023  REFERRING DIAG: REFERRING DIAG: G35 (ICD-10-CM) - Multiple sclerosis M62.81 (ICD-10-CM) - Muscle weakness (generalized)  THERAPY DIAG:  Muscle weakness (generalized)  Other lack of coordination  Abnormal posture  Rationale for Evaluation and Treatment: Rehabilitation  SUBJECTIVE:   SUBJECTIVE STATEMENT:   Pt reports that he has been walking to his mailbox again this week, working with the putty.  Pt accompanied by: self   PERTINENT HISTORY: Hx includes afib, BPH, CHF with h/o aortic dissection, depression, ORIF R radial fx 2006, pacemaker implant 09/2022, AKI, afib, bradycardia  MR Brain WO Contrast 09/22/2018: IMPRESSION: This MRI of the brain with and without contrast shows multiple T2/flair hyperintense foci in the brainstem, cerebellum and hemispheres in a pattern and configuration consistent with chronic demyelinating plaque associated with multiple sclerosis.  None of the foci appears to be acute.  There are no acute findings and there is a normal enhancement pattern.  There are no definite new lesions compared to the 2016 MRI.  PRECAUTIONS: Fall  WEIGHT BEARING RESTRICTIONS: No  PAIN:  Are you having pain? No  FALLS: Has patient fallen in last 6 months? No  LIVING ENVIRONMENT: Lives with: lives with an adult companion Lives in: House Stairs:  added ramp in the garage Has following equipment at home: Dan Humphreys - 2 wheeled, Environmental consultant - 4 wheeled, Wheelchair (manual), shower chair, bed side commode, Grab  bars, and Ramped entry  PLOF: Needs assistance with ADLs, Needs assistance with homemaking, Needs assistance with gait, and Needs assistance with transfers - 24 hour care since 12/03/22, Pt drives vehicle with hand controls   PATIENT GOALS: Updated HEP for UEs  OBJECTIVE:  Note: Objective measures were completed at Evaluation unless otherwise noted.  HAND DOMINANCE: Right  ADLs: Overall ADLs: 24/7 caregivers x 4 each day including overnight Transfers/ambulation related to ADLs: supervision Eating: setup Grooming: min to mod assist UB Dressing: mod LB Dressing: max Toileting: urinary incontinence; bowel mod-max assist Bathing: extensive assistance Tub Shower transfers: supervision Equipment: Shower seat with back, Grab bars, Walk in shower, and bed side commode  IADLs: Shopping: accompanies caregivers at times but max assist Light housekeeping: caregivers 24/7 Meal Prep: caregivers 24/7 Community mobility: extensive assistance with transport WC Medication management: caregivers 24/7 Financial management: Ind Handwriting:  NT  MOBILITY STATUS: Needs Assist: transport WC  POSTURE COMMENTS:  rounded shoulders, forward head, decreased lumbar lordosis, and flexed trunk  Sitting balance:  fair in WC  ACTIVITY TOLERANCE: Activity tolerance: fair  FUNCTIONAL OUTCOME MEASURES: Modified Barthel Index for ADLs - 35/100  UPPER EXTREMITY ROM:    Active ROM Right eval Left eval  Shoulder flexion Moves into scaption  Shoulder abduction 90   Shoulder adduction    Shoulder extension    Shoulder internal rotation    Shoulder external rotation    Elbow flexion [facility] WFL  Elbow extension [facility] WFL  Wrist flexion    Wrist extension    Wrist ulnar deviation    Wrist radial deviation    Wrist pronation    Wrist supination    (Blank rows = not tested)  UPPER EXTREMITY MMT:     MMT Right eval Left eval  Shoulder flexion 4 4  Shoulder abduction    Shoulder adduction     Shoulder extension    Shoulder internal rotation    Shoulder external rotation    Middle trapezius    Lower trapezius    Elbow flexion 4 4  Elbow extension 3 3  Wrist flexion    Wrist extension    Wrist ulnar deviation    Wrist radial deviation    Wrist pronation    Wrist supination    (Blank rows = not tested)  HAND FUNCTION: Grip strength: Right: 33.7, 34.8, 36.1  lbs; Left: 39.9, 46.7, 41.0 lbs Average: Right: 34.9 lbs, Left: 42.5 lbs  COORDINATION: 9 Hole Peg test: Right: 92.54 sec; Left: 3:08.66 sec  SENSATION: WFL  EDEMA: None in UE just R LE  MUSCLE TONE: Stiffness noted.  COGNITION: Overall cognitive status: Self-reported word finding difficulties  VISION: Subjective report: His ophthalmologist retired and hasn't seen a new one yet but he has "amblyopia" (lazy eye) - Previously the L eye got corrected to near 20/20 but needs new prescription  Baseline vision: Bifocals Visual history:  Amblyopia  VISION ASSESSMENT: Not tested  Patient has difficulty with following activities due to following visual impairments: has trouble driving at night esepcially with bright lights  PERCEPTION: Not tested  PRAXIS: Not tested  OBSERVATIONS: Pt arrived in transport [facility] with caregiver present.  Pt reports driving with manual controls and has glasses donned. The pt appears well kept and is pleasant and cooperative throughout evaluation with simple goals to establish HEP for home carryover.   TODAY'S TREATMENT:                                                                                                                               Therapeutic Activities:  Reviewed some goals including discussing participation in bathing and dressing with pt reporting he used to stand better for LB dressing, therefore assessed and practiced standing with determination that pt could benefit from improved sit to stand ie) to decrease leaning backwards with training, practice and instruction.   Pt encouraged to work on leaning forward for push to stand and during sitting ie) "nose over toes".  Pt to consider entering shower with regular shoe/brace on and then having caregivers remove shoe and apply shower shoes.  In addition, he could dry in the shower and don shoe and brace to more safely step out of the shower if possible  too.  Pt still benefited from slight forward guidance at gait belt to push to stand form lower WC height but is encouraged to work on this with daily routine ie) when he goes to the bathroom etc.  Pt also engaged in FM task with golf tee like pegs and marbles with pt pushing golf tees into putty and placing marbles atop each peg.  Pt engaged in working B UE as, although he reports RUE being more affected by MS, he still has some deficits on L side.    PATIENT EDUCATION: Education details: Arm push ups Person educated: Patient Education method: Explanation, Demonstration, Tactile cues, Verbal cues, and Handouts Education comprehension: verbalized understanding, returned demonstration, verbal cues required, tactile cues required, and needs further education  HOME EXERCISE PROGRAM: 07/19/23: Putty Activities - Access Code: [email or web address] 07/16/23: Coordination Activities  08/03/23: Armchair pushups - added to previous access code   GOALS: Goals reviewed with patient? Yes  SHORT TERM GOALS: Target date: 08/09/23  Patient will demonstrate initial UE HEP with 25% verbal cues or less for proper execution. Baseline: New to outpt OT Goal status: IN Progress  2.  Patient will demonstrate at least 3-5 lbs improvement in B grip strength as needed for improved use and safety with BUEs. Baseline: Average: Right: 34.9 lbs, Left: 42.5 lbs Goal status: IN Progress  3.  Pt will independently recall at least 3 energy conservation techniques as noted in pt instructions for max participation in preferred daily activities.     Baseline: new to outpt OT    Goal status: IN  progress   LONG TERM GOALS: Target date: 09/06/23  Patient will demonstrate updated B HEP with visual printouts only for proper execution.  Baseline: New to outpt OT Goal status: IN Progress  2.  Patient will verbalize understanding of adapted strategies to maximize safety and independence with ADLs/IADLs. Baseline: Modified Barthel Index for ADLs - 35/100 - Severe Dependency Goal status: IN Progress  3.  Patient will complete nine-hole peg with use of B UE with 10+ second improvement. Baseline: Right: 92.54 sec; Left: 3:08.66 sec Goal status: IN Progress  ASSESSMENT:  CLINICAL IMPRESSION: Patient is a 77 y.o. male who was seen today for occupational therapy treatment for multiple sclerosis. Patient reviewed UE coordination activities and practiced sit to stand to help with ADLs. Pt will benefit from skilled OT services in the outpatient setting to progress HEP to address strength and coordination deficits as noted in eval to help pt maximize functional skills as able.    PERFORMANCE DEFICITS: in functional skills including ADLs, IADLs, coordination, dexterity, ROM, strength, flexibility, Fine motor control, Gross motor control, decreased knowledge of precautions, decreased knowledge of use of DME, and UE functional use, cognitive skills including memory and thought, and psychosocial skills including coping strategies, environmental adaptation, and routines and behaviors.   IMPAIRMENTS: are limiting patient from ADLs, IADLs, rest and sleep, leisure, and social participation.   CO-MORBIDITIES: has co-morbidities such as pacemaker  that affects occupational performance. Patient will benefit from skilled OT to address above impairments and improve overall function.  REHAB POTENTIAL: Fair due to chronicity of disease   PLAN:  OT FREQUENCY: 1x/week  OT DURATION: 8 weeks  PLANNED INTERVENTIONS: 97535 self care/ADL training, 95621 therapeutic exercise, 97530 therapeutic activity, 97112  neuromuscular re-education, [phone] aquatic therapy, energy conservation, coping strategies training, patient/family education, and DME and/or AE instructions  RECOMMENDED OTHER SERVICES: Pt receiving PT services at this time & already receiving 24/7 caregiver support at home  CONSULTED AND AGREED WITH PLAN OF CARE: Patient  PLAN FOR NEXT SESSION:   "Lifting Cans" ROM program - per pt recall of previous HEP Progress HEPs - review and progress coordination HEP, review strength (putty) Intro to energy conservation for MS - handouts Explore AE needs/options for ADLs   Victorino Sparrow, OT 08/10/2023, 4:09 PM

## 2023-08-11 ENCOUNTER — Ambulatory Visit (HOSPITAL_COMMUNITY): Payer: Medicare Other | Attending: Internal Medicine

## 2023-08-11 DIAGNOSIS — R0602 Shortness of breath: Secondary | ICD-10-CM | POA: Insufficient documentation

## 2023-08-11 LAB — ECHOCARDIOGRAM COMPLETE
AV Vena cont: 0.4 cm
Area-P 1/2: 4.15 cm2
Calc EF: 33.4 %
MV M vel: 5.27 m/s
MV Peak grad: 110.9 mm[Hg]
P 1/2 time: 393 ms
Radius: 0.4 cm
S' Lateral: 3.7 cm
Single Plane A2C EF: 30.3 %
Single Plane A4C EF: 35.4 %

## 2023-08-17 ENCOUNTER — Ambulatory Visit: Payer: Medicare Other | Attending: Internal Medicine | Admitting: Occupational Therapy

## 2023-08-17 DIAGNOSIS — M6281 Muscle weakness (generalized): Secondary | ICD-10-CM | POA: Insufficient documentation

## 2023-08-17 DIAGNOSIS — R29818 Other symptoms and signs involving the nervous system: Secondary | ICD-10-CM | POA: Insufficient documentation

## 2023-08-17 DIAGNOSIS — R278 Other lack of coordination: Secondary | ICD-10-CM | POA: Insufficient documentation

## 2023-08-17 NOTE — Patient Instructions (Addendum)
 (  Exercise) Monday Tuesday Wednesday Thursday Friday Saturday Sunday   Walking           Bridge Ex                      Putty           Soup Cans           [employer]

## 2023-08-17 NOTE — Therapy (Unsigned)
OUTPATIENT OCCUPATIONAL THERAPY NEURO TREATMENT  Patient Name: [name] MRN: [id number] DOB:10-06-1945, 77 y.o., male Today's Date: 08/17/2023  PCP: Eloisa Northern, MD REFERRING PROVIDER: Suanne Marker, MD  END OF SESSION:  OT End of Session - 08/17/23 1455     Visit Number 6    Number of Visits 8   + evaluation   Date for OT Re-Evaluation 09/10/23    Authorization Type Medicare A&B covered @ 100% w/ State [employer]    OT Start Time 1453    OT Stop Time 1531    OT Time Calculation (min) 38 min    Activity Tolerance Patient tolerated treatment well    Behavior During Therapy WFL for tasks assessed/performed             Past Medical History:  Diagnosis Date   Anemia    Aneurysm of aortic arch (HCC) 04/04/2012   Chronic aneurysmal dilatation of aortic arch with chronic type A aortic dissection, s/p replacement of ascending thoracic aorta   Aortic dissection (HCC) 11/05/1998   S/P emergency repair of acute type A aortic dissection with resuspension of native aortic valve   Aortic dissection, thoracic (HCC)    Aortic root aneurysm    Atrial fibrillation (HCC) 01/30/2013   Atrial fibrillation    Bilateral lower extremity edema    BPH (benign prostatic hyperplasia)    CHF (congestive heart failure) (HCC) Sep 27, 1945   2D Echo - EF 50-55%, mild-moderately dilated right ventricle, mild-moderate tricuspid valve regurgitation, moderately dilated right atrium   Depression    Dyslipidemia    Exertional shortness of breath    "for awhile now" (09/11/2013)   Hemorrhoids    History of blood transfusion    Hypertension    Mitral regurgitation 02/20/2013   Multiple sclerosis (HCC)    Neuromuscular disorder (HCC)    Pleural effusion, right, large 01/30/2013   Severe tricuspid valve regurgitation 01/30/2013   Severe tricuspid regurg by recent 2-D echo with a dilated tricuspid annulus, moderate pulmonary hypertension and biatrial enlargement    Thrombocytopenia (HCC)    Past  Surgical History:  Procedure Laterality Date   HEMORRHOID SURGERY  2009   Internal, external hemorrhoidectomy, general anesthesia,prone position. [Other]   Open reduction and internal fixation of right distal radius fracture using [employer] distal radius volar locking plate.  07/2005   Dr Magnus Ivan   PACEMAKER IMPLANT N/A 09/28/2022   Procedure: PACEMAKER IMPLANT;  Surgeon: Marinus Maw, MD;  Location: [facility] INVASIVE CV LAB;  Service: Cardiovascular;  Laterality: N/A;   Repair of acute type A aortic dissection with resuspension of native aortic valve  10/15/1998   Dr Cornelius Moras   TEE WITHOUT CARDIOVERSION N/A 02/17/2013   Procedure: TRANSESOPHAGEAL ECHOCARDIOGRAM (TEE);  Surgeon: Thurmon Fair, MD;  Location: [facility] ENDOSCOPY;  Service: Cardiovascular;  Laterality: N/A;   TEMPORARY PACEMAKER N/A 09/26/2022   Procedure: TEMPORARY PACEMAKER;  Surgeon: Tonny Bollman, MD;  Location: [facility] INVASIVE CV LAB;  Service: Cardiovascular;  Laterality: N/A;   Patient Active Problem List   Diagnosis Date Noted   Bradycardia 09/27/2022   Pacemaker 09/27/2022   Symptomatic bradycardia 09/26/2022   Paroxysmal atrial fibrillation (HCC) 09/26/2022   Essential hypertension 09/26/2022   Acute renal failure superimposed on stage 3b chronic kidney disease (HCC) 09/25/2022   Aortic root aneurysm    H/O aortic dissection 02/13/2016   Aneurysm of aortic arch (HCC) 12/31/2014   Tricuspid regurgitation 10/08/2014   RVF (right ventricular failure) (HCC) 10/08/2014   Chronic diastolic CHF (congestive  heart failure) (HCC) 10/09/2013   Sinus pause 09/16/2013   Anemia due to blood loss, acute in setting of supratheraputic INR 09/11/13 09/12/2013   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 09/12/2013   Acute renal insufficiency 09/12/2013   Anasarca 09/12/2013   Anemia of chronic disease 09/12/2013   Bilateral leg edema 09/11/2013   Acute right-sided congestive heart failure (HCC) 09/11/2013   Unstable gait  09/11/2013   Dissection of aorta, thoracic- surgery Feb 2000 02/20/2013   Mitral regurgitation 02/20/2013   Lower extremity edema 02/20/2013   Volume overload 01/30/2013   Severe tricuspid valve regurgitation 01/30/2013   Pleural effusion, right, large 01/30/2013   Multiple sclerosis (HCC) 01/13/2013   Thrombocytopenia (HCC) 07/26/2011   Aortic dissection- chronic abd dissection  11/05/1998    ONSET DATE: 06/11/2023  REFERRING DIAG: REFERRING DIAG: G35 (ICD-10-CM) - Multiple sclerosis M62.81 (ICD-10-CM) - Muscle weakness (generalized)  THERAPY DIAG:  Muscle weakness (generalized)  Other lack of coordination  Rationale for Evaluation and Treatment: Rehabilitation  SUBJECTIVE:   SUBJECTIVE STATEMENT:   Pt has been walking to his mailbox once a day except when it's too cold - then you walk the hallwasy in the house..  Lifted soup cans and did his bridge exercises a couple of days/week with   Has been working with the putty at the kitchen table.     Pt accompanied by: self   PERTINENT HISTORY: Hx includes afib, BPH, CHF with h/o aortic dissection, depression, ORIF R radial fx 2006, pacemaker implant 09/2022, AKI, afib, bradycardia  MR Brain WO Contrast 09/22/2018: IMPRESSION: This MRI of the brain with and without contrast shows multiple T2/flair hyperintense foci in the brainstem, cerebellum and hemispheres in a pattern and configuration consistent with chronic demyelinating plaque associated with multiple sclerosis.  None of the foci appears to be acute.  There are no acute findings and there is a normal enhancement pattern.  There are no definite new lesions compared to the 2016 MRI.  PRECAUTIONS: Fall  WEIGHT BEARING RESTRICTIONS: No  PAIN:  Are you having pain? No  FALLS: Has patient fallen in last 6 months? No  LIVING ENVIRONMENT: Lives with: lives with an adult companion Lives in: House Stairs:  added ramp in the garage Has following equipment at home: Dan Humphreys -  2 wheeled, Environmental consultant - 4 wheeled, Wheelchair (manual), shower chair, bed side commode, Grab bars, and Ramped entry  PLOF: Needs assistance with ADLs, Needs assistance with homemaking, Needs assistance with gait, and Needs assistance with transfers - 24 hour care since 12/03/22, Pt drives vehicle with hand controls   PATIENT GOALS: Updated HEP for UEs  OBJECTIVE:  Note: Objective measures were completed at Evaluation unless otherwise noted.  HAND DOMINANCE: Right  ADLs: Overall ADLs: 24/7 caregivers x 4 each day including overnight Transfers/ambulation related to ADLs: supervision Eating: setup Grooming: min to mod assist UB Dressing: mod LB Dressing: max Toileting: urinary incontinence; bowel mod-max assist Bathing: extensive assistance Tub Shower transfers: supervision Equipment: Shower seat with back, Grab bars, Walk in shower, and bed side commode  IADLs: Shopping: accompanies caregivers at times but max assist Light housekeeping: caregivers 24/7 Meal Prep: caregivers 24/7 Community mobility: extensive assistance with transport WC Medication management: caregivers 24/7 Financial management: Ind Handwriting:  NT  MOBILITY STATUS: Needs Assist: transport WC  POSTURE COMMENTS:  rounded shoulders, forward head, decreased lumbar lordosis, and flexed trunk  Sitting balance:  fair in WC  ACTIVITY TOLERANCE: Activity tolerance: fair  FUNCTIONAL OUTCOME MEASURES: Modified Barthel Index  for ADLs - 35/100  UPPER EXTREMITY ROM:    Active ROM Right eval Left eval  Shoulder flexion Moves into scaption   Shoulder abduction 90   Shoulder adduction    Shoulder extension    Shoulder internal rotation    Shoulder external rotation    Elbow flexion [facility] WFL  Elbow extension [facility] WFL  Wrist flexion    Wrist extension    Wrist ulnar deviation    Wrist radial deviation    Wrist pronation    Wrist supination    (Blank rows = not tested)  UPPER EXTREMITY MMT:     MMT  Right eval Left eval  Shoulder flexion 4 4  Shoulder abduction    Shoulder adduction    Shoulder extension    Shoulder internal rotation    Shoulder external rotation    Middle trapezius    Lower trapezius    Elbow flexion 4 4  Elbow extension 3 3  Wrist flexion    Wrist extension    Wrist ulnar deviation    Wrist radial deviation    Wrist pronation    Wrist supination    (Blank rows = not tested)  HAND FUNCTION: Grip strength: Right: 33.7, 34.8, 36.1  lbs; Left: 39.9, 46.7, 41.0 lbs Average: Right: 34.9 lbs, Left: 42.5 lbs  COORDINATION: 9 Hole Peg test: Right: 92.54 sec; Left: 3:08.66 sec  SENSATION: WFL  EDEMA: None in UE just R LE  MUSCLE TONE: Stiffness noted.  COGNITION: Overall cognitive status: Self-reported word finding difficulties  VISION: Subjective report: His ophthalmologist retired and hasn't seen a new one yet but he has "amblyopia" (lazy eye) - Previously the L eye got corrected to near 20/20 but needs new prescription  Baseline vision: Bifocals Visual history:  Amblyopia  VISION ASSESSMENT: Not tested  Patient has difficulty with following activities due to following visual impairments: has trouble driving at night esepcially with bright lights  PERCEPTION: Not tested  PRAXIS: Not tested  OBSERVATIONS: Pt arrived in transport [facility] with caregiver present.  Pt reports driving with manual controls and has glasses donned. The pt appears well kept and is pleasant and cooperative throughout evaluation with simple goals to establish HEP for home carryover.   TODAY'S TREATMENT:                                                                                                                               Therapeutic Exercises:  Pt engaged in various HEP ideas for home carryover with theraband and/or a can per his prior suggestion.  Added Exercises to HEP with trials, training and modifications as needed.  In addition, pt is encouraged to slow down his  motions ie) not let the band 'pop' back when performing motions.   - Seated Shoulder Horizontal Abduction with Resistance  - pulling red theraband out to the sides with both hands at the same time - Seated Shoulder Flexion with Self-Anchored Resistance  -  lifting band overhead with elbow straight as much as possible - Seated Elbow Flexion and Extension AROM  - performed holding an object like a can or water bottle - Seated Shoulder Flexion AAROM with   Can - similar to band but with an object or with dressing ie) lifting shirt over head, pushing arms through sleeves etc  Pt was also engaged in over the door exercises with band ie) pull down for shoulder and elbow extension as well as crossing midline with WC turned.    PATIENT EDUCATION: Education details: UE HEP - theraband Person educated: Patient Education method: Explanation, Demonstration, Tactile cues, Verbal cues, and Handouts Education comprehension: verbalized understanding, returned demonstration, verbal cues required, tactile cues required, and needs further education  HOME EXERCISE PROGRAM: 07/19/23: Putty Activities - Access Code: [email or web address] 07/16/23: Coordination Activities  08/03/23: Armchair pushups - added to previous access code 08/10/23: UE HEP - same as previous access code  GOALS: Goals reviewed with patient? Yes  SHORT TERM GOALS: Target date: 08/09/23  Patient will demonstrate initial UE HEP with 25% verbal cues or less for proper execution. Baseline: New to outpt OT Goal status: MET  2.  Patient will demonstrate at least 3-5 lbs improvement in B grip strength as needed for improved use and safety with BUEs. Baseline: Average: Right: 34.9 lbs, Left: 42.5 lbs Goal status: IN Progress  3.  Pt will independently recall at least 3 energy conservation techniques as noted in pt instructions for max participation in preferred daily activities.     Baseline: new to outpt OT    Goal status: IN progress   LONG TERM  GOALS: Target date: 09/06/23  Patient will demonstrate updated B HEP with visual printouts only for proper execution.  Baseline: New to outpt OT Goal status: IN Progress  2.  Patient will verbalize understanding of adapted strategies to maximize safety and independence with ADLs/IADLs. Baseline: Modified Barthel Index for ADLs - 35/100 - Severe Dependency Goal status: IN Progress  3.  Patient will complete nine-hole peg with use of B UE with 10+ second improvement. Baseline: Right: 92.54 sec; Left: 3:08.66 sec Goal status: IN Progress  ASSESSMENT:  CLINICAL IMPRESSION: Patient is a 77 y.o. male who was seen today for occupational therapy treatment for multiple sclerosis. Patient reviewed UE strengthening activities and instructed to use ROM and strength tasks to help with ADLs ie) lift shirt over his head, push arms through sleeves etc.  Caregiver notes that she does much of these tasks for him at this time. Max cues/encouragement given to work tasks into daily routine - may benefit from a exercise calendar.  Pt will benefit from skilled OT services in the outpatient setting to progress HEP to address strength and coordination deficits as noted in eval to help pt maximize functional skills as able.    PERFORMANCE DEFICITS: in functional skills including ADLs, IADLs, coordination, dexterity, ROM, strength, flexibility, Fine motor control, Gross motor control, decreased knowledge of precautions, decreased knowledge of use of DME, and UE functional use, cognitive skills including memory and thought, and psychosocial skills including coping strategies, environmental adaptation, and routines and behaviors.   IMPAIRMENTS: are limiting patient from ADLs, IADLs, rest and sleep, leisure, and social participation.   CO-MORBIDITIES: has co-morbidities such as pacemaker  that affects occupational performance. Patient will benefit from skilled OT to address above impairments and improve overall  function.  REHAB POTENTIAL: Fair due to chronicity of disease   PLAN:  OT FREQUENCY: 1x/week  OT DURATION: 8  weeks  PLANNED INTERVENTIONS: 97535 self care/ADL training, 29528 therapeutic exercise, 97530 therapeutic activity, 97112 neuromuscular re-education, [phone] aquatic therapy, energy conservation, coping strategies training, patient/family education, and DME and/or AE instructions  RECOMMENDED OTHER SERVICES: Pt receiving PT services at this time & already receiving 24/7 caregiver support at home  CONSULTED AND AGREED WITH PLAN OF CARE: Patient  PLAN FOR NEXT SESSION:   Review/Progress ROM/strength and coordination program  Weekly exercise calendar Intro to energy conservation for MS - handouts Explore AE needs/options for ADLs   Victorino Sparrow, OT 08/17/2023, 5:31 PM

## 2023-08-23 ENCOUNTER — Other Ambulatory Visit (HOSPITAL_COMMUNITY): Payer: Self-pay | Admitting: Internal Medicine

## 2023-08-24 ENCOUNTER — Ambulatory Visit: Payer: Medicare Other | Admitting: Occupational Therapy

## 2023-08-24 DIAGNOSIS — R29818 Other symptoms and signs involving the nervous system: Secondary | ICD-10-CM

## 2023-08-24 DIAGNOSIS — M6281 Muscle weakness (generalized): Secondary | ICD-10-CM | POA: Diagnosis not present

## 2023-08-24 DIAGNOSIS — R278 Other lack of coordination: Secondary | ICD-10-CM

## 2023-08-24 NOTE — Therapy (Signed)
OUTPATIENT OCCUPATIONAL THERAPY NEURO TREATMENT & DISCHARGE SUMMARY  Patient Name: [name] MRN: [id number] DOB:10/13/1945, 77 y.o., male Today's Date: 08/24/2023  PCP: Eloisa Northern, MD REFERRING PROVIDER: Suanne Marker, MD  END OF SESSION:  OT End of Session - 08/24/23 1450     Visit Number 7    Number of Visits 8   + evaluation   Date for OT Re-Evaluation 09/10/23    Authorization Type Medicare A&B covered @ 100% w/ State BC[employer]    OT Start Time 1448    OT Stop Time 1530    OT Time Calculation (min) 42 min    Equipment Utilized During Treatment FM objects    Activity Tolerance Patient tolerated treatment well    Behavior During Therapy WFL for tasks assessed/performed             Past Medical History:  Diagnosis Date   Anemia    Aneurysm of aortic arch (HCC) 04/04/2012   Chronic aneurysmal dilatation of aortic arch with chronic type A aortic dissection, s/p replacement of ascending thoracic aorta   Aortic dissection (HCC) 11/05/1998   S/P emergency repair of acute type A aortic dissection with resuspension of native aortic valve   Aortic dissection, thoracic (HCC)    Aortic root aneurysm    Atrial fibrillation (HCC) 01/30/2013   Atrial fibrillation    Bilateral lower extremity edema    BPH (benign prostatic hyperplasia)    CHF (congestive heart failure) (HCC) 1946/01/08   2D Echo - EF 50-55%, mild-moderately dilated right ventricle, mild-moderate tricuspid valve regurgitation, moderately dilated right atrium   Depression    Dyslipidemia    Exertional shortness of breath    "for awhile now" (09/11/2013)   Hemorrhoids    History of blood transfusion    Hypertension    Mitral regurgitation 02/20/2013   Multiple sclerosis (HCC)    Neuromuscular disorder (HCC)    Pleural effusion, right, large 01/30/2013   Severe tricuspid valve regurgitation 01/30/2013   Severe tricuspid regurg by recent 2-D echo with a dilated tricuspid annulus, moderate pulmonary  hypertension and biatrial enlargement    Thrombocytopenia (HCC)    Past Surgical History:  Procedure Laterality Date   HEMORRHOID SURGERY  2009   Internal, external hemorrhoidectomy, general anesthesia,prone position. [Other]   Open reduction and internal fixation of right distal radius fracture using [employer] distal radius volar locking plate.  07/2005   Dr Magnus Ivan   PACEMAKER IMPLANT N/A 09/28/2022   Procedure: PACEMAKER IMPLANT;  Surgeon: Marinus Maw, MD;  Location: [facility] INVASIVE CV LAB;  Service: Cardiovascular;  Laterality: N/A;   Repair of acute type A aortic dissection with resuspension of native aortic valve  10/15/1998   Dr Cornelius Moras   TEE WITHOUT CARDIOVERSION N/A 02/17/2013   Procedure: TRANSESOPHAGEAL ECHOCARDIOGRAM (TEE);  Surgeon: Thurmon Fair, MD;  Location: [facility] ENDOSCOPY;  Service: Cardiovascular;  Laterality: N/A;   TEMPORARY PACEMAKER N/A 09/26/2022   Procedure: TEMPORARY PACEMAKER;  Surgeon: Tonny Bollman, MD;  Location: [facility] INVASIVE CV LAB;  Service: Cardiovascular;  Laterality: N/A;   Patient Active Problem List   Diagnosis Date Noted   Bradycardia 09/27/2022   Pacemaker 09/27/2022   Symptomatic bradycardia 09/26/2022   Paroxysmal atrial fibrillation (HCC) 09/26/2022   Essential hypertension 09/26/2022   Acute renal failure superimposed on stage 3b chronic kidney disease (HCC) 09/25/2022   Aortic root aneurysm    H/O aortic dissection 02/13/2016   Aneurysm of aortic arch (HCC) 12/31/2014   Tricuspid regurgitation 10/08/2014  RVF (right ventricular failure) (HCC) 10/08/2014   Chronic diastolic CHF (congestive heart failure) (HCC) 10/09/2013   Sinus pause 09/16/2013   Anemia due to blood loss, acute in setting of supratheraputic INR 09/11/13 09/12/2013   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 09/12/2013   Acute renal insufficiency 09/12/2013   Anasarca 09/12/2013   Anemia of chronic disease 09/12/2013   Bilateral leg edema 09/11/2013   Acute  right-sided congestive heart failure (HCC) 09/11/2013   Unstable gait 09/11/2013   Dissection of aorta, thoracic- surgery Feb 2000 02/20/2013   Mitral regurgitation 02/20/2013   Lower extremity edema 02/20/2013   Volume overload 01/30/2013   Severe tricuspid valve regurgitation 01/30/2013   Pleural effusion, right, large 01/30/2013   Multiple sclerosis (HCC) 01/13/2013   Thrombocytopenia (HCC) 07/26/2011   Aortic dissection- chronic abd dissection  11/05/1998    ONSET DATE: 06/11/2023  REFERRING DIAG: REFERRING DIAG: G35 (ICD-10-CM) - Multiple sclerosis M62.81 (ICD-10-CM) - Muscle weakness (generalized)  THERAPY DIAG:  Muscle weakness (generalized)  Other lack of coordination  Other symptoms and signs involving the nervous system  Rationale for Evaluation and Treatment: Rehabilitation  SUBJECTIVE:   SUBJECTIVE STATEMENT:   Pt reports that he continues to walk daily and has been doing his leg exercises because he has an aide with him daily.  Pt reports doing his other activities a couple of days/week.  Pt is aware today is his last visit and is satisfied with his HEP ideas.  Pt accompanied by: self   PERTINENT HISTORY: Hx includes afib, BPH, CHF with h/o aortic dissection, depression, ORIF R radial fx 2006, pacemaker implant 09/2022, AKI, afib, bradycardia  MR Brain WO Contrast 09/22/2018: IMPRESSION: This MRI of the brain with and without contrast shows multiple T2/flair hyperintense foci in the brainstem, cerebellum and hemispheres in a pattern and configuration consistent with chronic demyelinating plaque associated with multiple sclerosis.  None of the foci appears to be acute.  There are no acute findings and there is a normal enhancement pattern.  There are no definite new lesions compared to the 2016 MRI.  PRECAUTIONS: Fall  WEIGHT BEARING RESTRICTIONS: No  PAIN:  Are you having pain? No  FALLS: Has patient fallen in last 6 months? No  LIVING ENVIRONMENT: Lives  with: lives with an adult companion Lives in: House Stairs:  added ramp in the garage Has following equipment at home: Dan Humphreys - 2 wheeled, Environmental consultant - 4 wheeled, Wheelchair (manual), shower chair, bed side commode, Grab bars, and Ramped entry  PLOF: Needs assistance with ADLs, Needs assistance with homemaking, Needs assistance with gait, and Needs assistance with transfers - 24 hour care since 12/03/22, Pt drives vehicle with hand controls   PATIENT GOALS: Updated HEP for UEs  OBJECTIVE:  Note: Objective measures were completed at Evaluation unless otherwise noted.  HAND DOMINANCE: Right  ADLs: Overall ADLs: 24/7 caregivers x 4 each day including overnight Transfers/ambulation related to ADLs: supervision Eating: setup Grooming: min to mod assist UB Dressing: mod LB Dressing: max Toileting: urinary incontinence; bowel mod-max assist Bathing: extensive assistance Tub Shower transfers: supervision Equipment: Shower seat with back, Grab bars, Walk in shower, and bed side commode  IADLs: Shopping: accompanies caregivers at times but max assist Light housekeeping: caregivers 24/7 Meal Prep: caregivers 24/7 Community mobility: extensive assistance with transport WC Medication management: caregivers 24/7 Financial management: Ind Handwriting:  NT  MOBILITY STATUS: Needs Assist: transport WC  POSTURE COMMENTS:  rounded shoulders, forward head, decreased lumbar lordosis, and flexed trunk  Sitting  balance:  fair in WC  ACTIVITY TOLERANCE: Activity tolerance: fair  FUNCTIONAL OUTCOME MEASURES: Modified Barthel Index for ADLs - 35/100 DC 08/24/23 Modified Barthel - 48/100  UPPER EXTREMITY ROM:    Active ROM Right eval Left eval  Shoulder flexion Moves into scaption   Shoulder abduction 90   Shoulder adduction    Shoulder extension    Shoulder internal rotation    Shoulder external rotation    Elbow flexion WFL WFL  Elbow extension [facility] WFL  Wrist flexion    Wrist  extension    Wrist ulnar deviation    Wrist radial deviation    Wrist pronation    Wrist supination    (Blank rows = not tested)  UPPER EXTREMITY MMT:     MMT Right eval Left eval  Shoulder flexion 4 4  Shoulder abduction    Shoulder adduction    Shoulder extension    Shoulder internal rotation    Shoulder external rotation    Middle trapezius    Lower trapezius    Elbow flexion 4 4  Elbow extension 3 3  Wrist flexion    Wrist extension    Wrist ulnar deviation    Wrist radial deviation    Wrist pronation    Wrist supination    (Blank rows = not tested)  HAND FUNCTION: Grip strength: Right: 33.7, 34.8, 36.1  lbs; Left: 39.9, 46.7, 41.0 lbs Average: Right: 34.9 lbs, Left: 42.5 lbs  COORDINATION: 9 Hole Peg test: Right: 92.54 sec; Left: 3:08.66 sec  SENSATION: WFL  EDEMA: None in UE just R LE  MUSCLE TONE: Stiffness noted.  COGNITION: Overall cognitive status: Self-reported word finding difficulties  VISION: Subjective report: His ophthalmologist retired and hasn't seen a new one yet but he has "amblyopia" (lazy eye) - Previously the L eye got corrected to near 20/20 but needs new prescription  Baseline vision: Bifocals Visual history:  Amblyopia  VISION ASSESSMENT: Not tested  Patient has difficulty with following activities due to following visual impairments: has trouble driving at night esepcially with bright lights  PERCEPTION: Not tested  PRAXIS: Not tested  OBSERVATIONS: Pt arrived in transport [facility] with caregiver present.  Pt reports driving with manual controls and has glasses donned. The pt appears well kept and is pleasant and cooperative throughout evaluation with simple goals to establish HEP for home carryover.   TODAY'S TREATMENT:                                                                                                                               Therapeutic Activities:  Pt engaged in arranging various HEP ideas for home carryover  with binder provided along with page protectors for each program, which OT found folded in his bag.  No additional exercises added but exercises/activities reviewed, exercise calendar highlighted and placed at the front of the binder.    Pt has handouts for: -Can Exercises -Coordination -Putty (reprinted today for binder) -  Theraband -Sit to stand (also reprinted today) -OTR also added handouts for Energy Conservation to binder for patient reference.  Pt engaged in coordination activities with dice game to work on individual grasp of small objects, palming them in hand an then releasing them.  Pt encouraged to think and use games or other functional activities ie) typing (as he is working on his autobiography) as FM tasks.    Retested grip strengths with good improvements (especially L UE) Eval: Average: Right: 34.9 lbs, Left: 42.5 lbs Discharge: Right: 36.8, 35.0, 38.1 Average 36.3 lbs and Left: 47.3, 50.7, 57.5 Average 51.3 lbs  Retested coordination with 9 hole peg test with good improvements (especially L UE again) Eval: Right: 92.54 sec; Left: 3:08.66 sec  Discharge: Right: 57.49 sec; Left 2:58.55  PATIENT EDUCATION: Education details: DC HEP recommendations/schedule Person educated: Patient and Caregiver CNA Education method: Explanation and Handouts Education comprehension: verbalized understanding and verbal cues required  HOME EXERCISE PROGRAM: 07/19/23: Putty Activities - Access Code: [email or web address] 07/16/23: Coordination Activities  08/03/23: Armchair pushups - added to previous access code 08/10/23: UE HEP - same as previous access code 08/17/23: Can Exercises (hand drawn) 08/24/23: Energy Conservation Handouts for reference  GOALS: Goals reviewed with patient? Yes  SHORT TERM GOALS: Target date: 08/09/23  Patient will demonstrate initial UE HEP with 25% verbal cues or less for proper execution. Baseline: New to outpt OT Goal status: MET  2.  Patient will demonstrate at  least 3-5 lbs improvement in B grip strength as needed for improved use and safety with BUEs. Baseline: Average: Right: 34.9 lbs, Left: 42.5 lbs Goal status: MET 08/24/23 R 36.8, 35.0, 38.1 Average 36.3 lbs L 47.3, 50.7, 57.5 Average 51.3 lbs  3.  Pt will independently recall at least 3 energy conservation techniques as noted in pt instructions for max participation in preferred daily activities.     Baseline: new to outpt OT    Goal status: MET    08/24/23: Pt provided energy conservation handouts for future reference   LONG TERM GOALS: Target date: 09/06/23  Patient will demonstrate updated B HEP with visual printouts only for proper execution.  Baseline: New to outpt OT Goal status: MET  2.  Patient will verbalize understanding of adapted strategies to maximize safety and independence with ADLs/IADLs. Baseline: Modified Barthel Index for ADLs - 35/100 - Severe Dependency Goal status: MET 08/24/23 Modified Barthel Index for ADLs - 48/100  3.  Patient will complete nine-hole peg with use of B UE with 10+ second improvement. Baseline: Right: 92.54 sec; Left: 3:08.66 sec  Goal status: MET 08/24/23 Right: 57.49 sec; Left 2:58.55  ASSESSMENT:  CLINICAL IMPRESSION: Patient is a 77 y.o. male who was seen today for occupational therapy treatment for multiple sclerosis. OTR reviewed DC plans for HEP including UE strengthening activities, LE/PT exercises etc with binder provided to patient for organization of information.  Max cues/encouragement given to work tasks into daily routine - OT put exercise calendar on outside of binder to encourage daily participation in different activities.    PERFORMANCE DEFICITS: in functional skills including ADLs, IADLs, coordination, dexterity, ROM, strength, flexibility, Fine motor control, Gross motor control, decreased knowledge of precautions, decreased knowledge of use of DME, and UE functional use, cognitive skills including memory and thought, and  psychosocial skills including coping strategies, environmental adaptation, and routines and behaviors.   IMPAIRMENTS: are limiting patient from ADLs, IADLs, rest and sleep, leisure, and social participation.   CO-MORBIDITIES: has co-morbidities such as pacemaker  that  affects occupational performance. Patient will benefit from skilled OT to address above impairments and improve overall function.  REHAB POTENTIAL: Fair due to chronicity of disease   PLAN:  OCCUPATIONAL THERAPY DISCHARGE SUMMARY  Visits from Start of Care: 7  Current functional level related to goals / functional outcomes: Pt has met all goals to satisfactory levels and is pleased with outcomes.   Remaining deficits: Pt has ongoing functional deficits related to MS but has shown improvement since eval in coordination, strength and functional tasks with home HEP established to manage deficits.   Education / Equipment: Pt has all needed materials and education. Pt understands how to continue on with self-management. See tx notes for more details.   Patient agrees to discharge due to max benefits received from outpatient occupational therapy / hand therapy at this time.   Victorino Sparrow, OT 08/24/2023, 4:43 PM

## 2023-09-29 ENCOUNTER — Ambulatory Visit (INDEPENDENT_AMBULATORY_CARE_PROVIDER_SITE_OTHER): Payer: Medicare Other

## 2023-09-29 DIAGNOSIS — I442 Atrioventricular block, complete: Secondary | ICD-10-CM | POA: Diagnosis not present

## 2023-09-29 LAB — CUP PACEART REMOTE DEVICE CHECK
Battery Voltage: 90
Date Time Interrogation Session: 20250115074018
Implantable Lead Connection Status: 753985
Implantable Lead Implant Date: 20240115
Implantable Lead Location: 753860
Implantable Lead Model: 377
Implantable Lead Serial Number: 8001096875
Implantable Pulse Generator Implant Date: 20240115
Pulse Gen Model: 407157
Pulse Gen Serial Number: 70421235

## 2023-11-09 NOTE — Progress Notes (Signed)
 Remote pacemaker transmission.

## 2023-11-11 ENCOUNTER — Other Ambulatory Visit (HOSPITAL_COMMUNITY): Payer: Self-pay | Admitting: Internal Medicine

## 2023-12-29 ENCOUNTER — Ambulatory Visit (INDEPENDENT_AMBULATORY_CARE_PROVIDER_SITE_OTHER): Payer: Medicare Other

## 2023-12-29 DIAGNOSIS — I442 Atrioventricular block, complete: Secondary | ICD-10-CM

## 2023-12-30 ENCOUNTER — Encounter: Payer: Self-pay | Admitting: Internal Medicine

## 2023-12-30 LAB — CUP PACEART REMOTE DEVICE CHECK
Date Time Interrogation Session: 20250416082918
Implantable Lead Connection Status: 753985
Implantable Lead Implant Date: 20240115
Implantable Lead Location: 753860
Implantable Lead Model: 377
Implantable Lead Serial Number: 8001096875
Implantable Pulse Generator Implant Date: 20240115
Pulse Gen Model: 407157
Pulse Gen Serial Number: 70421235

## 2024-01-03 ENCOUNTER — Telehealth: Payer: Self-pay | Admitting: Internal Medicine

## 2024-01-03 DIAGNOSIS — I10 Essential (primary) hypertension: Secondary | ICD-10-CM

## 2024-01-03 DIAGNOSIS — Z79899 Other long term (current) drug therapy: Secondary | ICD-10-CM

## 2024-01-03 DIAGNOSIS — I071 Rheumatic tricuspid insufficiency: Secondary | ICD-10-CM

## 2024-01-03 DIAGNOSIS — R0602 Shortness of breath: Secondary | ICD-10-CM

## 2024-01-03 NOTE — Telephone Encounter (Signed)
 Pt c/o Shortness Of Breath: STAT if SOB developed within the last 24 hours or pt is noticeably SOB on the phone  1. Are you currently SOB (can you hear that pt is SOB on the phone)? No   2. How long have you been experiencing SOB? 2-3 weeks   3. Are you SOB when sitting or when up moving around? Sitting/laying down   4. Are you currently experiencing any other symptoms?  No

## 2024-01-03 NOTE — Telephone Encounter (Signed)
 Spoke with patient and he states he saw [facility] specialist and he informed them of his SOB. They gave him lasix  40 mg BID for 3 days. He states that has helped with his SOB. He has not been SOB the last 3 days. He was informed to give office a call for next steps. He states they advised he may not be able to take that many mg but he may benefit from the lasix .

## 2024-01-04 NOTE — Telephone Encounter (Signed)
 Spoke with Pt. Told pt I would speak with Dr Carolynne Citron and get recommendations for him tomorrow.

## 2024-01-04 NOTE — Telephone Encounter (Signed)
 Patient called back because he said no one called him back on yesterday. Please advise

## 2024-01-05 ENCOUNTER — Encounter: Payer: Self-pay | Admitting: Internal Medicine

## 2024-01-05 ENCOUNTER — Other Ambulatory Visit (HOSPITAL_COMMUNITY): Payer: Self-pay

## 2024-01-05 MED ORDER — FUROSEMIDE 40 MG PO TABS
40.0000 mg | ORAL_TABLET | Freq: Every day | ORAL | 3 refills | Status: DC
  Filled 2024-01-05: qty 90, 90d supply, fill #0

## 2024-01-05 NOTE — Telephone Encounter (Signed)
 Left message to call back

## 2024-01-05 NOTE — Telephone Encounter (Signed)
 Error

## 2024-01-05 NOTE — Telephone Encounter (Signed)
 Patient called to follow-up on recommendations for his SOB.  See previous note.

## 2024-01-06 NOTE — Telephone Encounter (Signed)
 Spoke with pt. Pt states he is feeling better and is going to hold off on the lasix . Told Pt I would make a note and make Dr Carolynne Citron aware. Pt will call back if SOB or swelling start back up.

## 2024-01-06 NOTE — Telephone Encounter (Signed)
 Pt returning call, requesting cb

## 2024-01-18 ENCOUNTER — Other Ambulatory Visit (HOSPITAL_COMMUNITY): Payer: Self-pay

## 2024-01-24 ENCOUNTER — Encounter: Admitting: Student

## 2024-02-09 NOTE — Addendum Note (Signed)
 Addended by: Lott Rouleau A on: 02/09/2024 01:48 PM   Modules accepted: Orders

## 2024-02-09 NOTE — Progress Notes (Signed)
 Remote pacemaker transmission.

## 2024-02-12 ENCOUNTER — Encounter (HOSPITAL_COMMUNITY): Payer: Self-pay

## 2024-02-12 ENCOUNTER — Inpatient Hospital Stay (HOSPITAL_COMMUNITY)
Admission: EM | Admit: 2024-02-12 | Discharge: 2024-03-14 | DRG: 293 | Disposition: E | Attending: Pulmonary Disease | Admitting: Pulmonary Disease

## 2024-02-12 ENCOUNTER — Emergency Department (HOSPITAL_COMMUNITY)

## 2024-02-12 ENCOUNTER — Other Ambulatory Visit: Payer: Self-pay

## 2024-02-12 DIAGNOSIS — Z801 Family history of malignant neoplasm of trachea, bronchus and lung: Secondary | ICD-10-CM

## 2024-02-12 DIAGNOSIS — Z825 Family history of asthma and other chronic lower respiratory diseases: Secondary | ICD-10-CM

## 2024-02-12 DIAGNOSIS — D649 Anemia, unspecified: Secondary | ICD-10-CM | POA: Insufficient documentation

## 2024-02-12 DIAGNOSIS — K5900 Constipation, unspecified: Secondary | ICD-10-CM | POA: Diagnosis present

## 2024-02-12 DIAGNOSIS — D638 Anemia in other chronic diseases classified elsewhere: Secondary | ICD-10-CM | POA: Diagnosis present

## 2024-02-12 DIAGNOSIS — I469 Cardiac arrest, cause unspecified: Secondary | ICD-10-CM | POA: Diagnosis not present

## 2024-02-12 DIAGNOSIS — Z950 Presence of cardiac pacemaker: Secondary | ICD-10-CM

## 2024-02-12 DIAGNOSIS — I130 Hypertensive heart and chronic kidney disease with heart failure and stage 1 through stage 4 chronic kidney disease, or unspecified chronic kidney disease: Secondary | ICD-10-CM | POA: Diagnosis not present

## 2024-02-12 DIAGNOSIS — E785 Hyperlipidemia, unspecified: Secondary | ICD-10-CM | POA: Diagnosis present

## 2024-02-12 DIAGNOSIS — I10 Essential (primary) hypertension: Secondary | ICD-10-CM | POA: Diagnosis present

## 2024-02-12 DIAGNOSIS — D696 Thrombocytopenia, unspecified: Secondary | ICD-10-CM | POA: Diagnosis present

## 2024-02-12 DIAGNOSIS — N400 Enlarged prostate without lower urinary tract symptoms: Secondary | ICD-10-CM | POA: Diagnosis present

## 2024-02-12 DIAGNOSIS — Z808 Family history of malignant neoplasm of other organs or systems: Secondary | ICD-10-CM

## 2024-02-12 DIAGNOSIS — Z7901 Long term (current) use of anticoagulants: Secondary | ICD-10-CM

## 2024-02-12 DIAGNOSIS — Z66 Do not resuscitate: Secondary | ICD-10-CM | POA: Diagnosis present

## 2024-02-12 DIAGNOSIS — I447 Left bundle-branch block, unspecified: Secondary | ICD-10-CM | POA: Diagnosis present

## 2024-02-12 DIAGNOSIS — Z91148 Patient's other noncompliance with medication regimen for other reason: Secondary | ICD-10-CM

## 2024-02-12 DIAGNOSIS — R0602 Shortness of breath: Secondary | ICD-10-CM | POA: Diagnosis not present

## 2024-02-12 DIAGNOSIS — J9601 Acute respiratory failure with hypoxia: Secondary | ICD-10-CM | POA: Diagnosis present

## 2024-02-12 DIAGNOSIS — R042 Hemoptysis: Secondary | ICD-10-CM | POA: Diagnosis not present

## 2024-02-12 DIAGNOSIS — G35 Multiple sclerosis: Secondary | ICD-10-CM | POA: Diagnosis present

## 2024-02-12 DIAGNOSIS — I5023 Acute on chronic systolic (congestive) heart failure: Secondary | ICD-10-CM | POA: Diagnosis present

## 2024-02-12 DIAGNOSIS — J9811 Atelectasis: Secondary | ICD-10-CM | POA: Diagnosis present

## 2024-02-12 DIAGNOSIS — R579 Shock, unspecified: Secondary | ICD-10-CM | POA: Diagnosis not present

## 2024-02-12 DIAGNOSIS — Z515 Encounter for palliative care: Secondary | ICD-10-CM

## 2024-02-12 DIAGNOSIS — G4700 Insomnia, unspecified: Secondary | ICD-10-CM | POA: Diagnosis present

## 2024-02-12 DIAGNOSIS — I2510 Atherosclerotic heart disease of native coronary artery without angina pectoris: Secondary | ICD-10-CM | POA: Diagnosis present

## 2024-02-12 DIAGNOSIS — Z8249 Family history of ischemic heart disease and other diseases of the circulatory system: Secondary | ICD-10-CM

## 2024-02-12 DIAGNOSIS — D631 Anemia in chronic kidney disease: Secondary | ICD-10-CM | POA: Diagnosis present

## 2024-02-12 DIAGNOSIS — R600 Localized edema: Secondary | ICD-10-CM | POA: Diagnosis present

## 2024-02-12 DIAGNOSIS — I480 Paroxysmal atrial fibrillation: Secondary | ICD-10-CM | POA: Diagnosis present

## 2024-02-12 DIAGNOSIS — J9801 Acute bronchospasm: Secondary | ICD-10-CM | POA: Diagnosis not present

## 2024-02-12 DIAGNOSIS — J90 Pleural effusion, not elsewhere classified: Principal | ICD-10-CM | POA: Diagnosis present

## 2024-02-12 DIAGNOSIS — G9340 Encephalopathy, unspecified: Secondary | ICD-10-CM | POA: Diagnosis not present

## 2024-02-12 DIAGNOSIS — I5043 Acute on chronic combined systolic (congestive) and diastolic (congestive) heart failure: Secondary | ICD-10-CM | POA: Diagnosis present

## 2024-02-12 DIAGNOSIS — L89153 Pressure ulcer of sacral region, stage 3: Secondary | ICD-10-CM | POA: Diagnosis present

## 2024-02-12 DIAGNOSIS — N1832 Chronic kidney disease, stage 3b: Secondary | ICD-10-CM | POA: Diagnosis present

## 2024-02-12 DIAGNOSIS — Z888 Allergy status to other drugs, medicaments and biological substances status: Secondary | ICD-10-CM

## 2024-02-12 DIAGNOSIS — Z79899 Other long term (current) drug therapy: Secondary | ICD-10-CM

## 2024-02-12 DIAGNOSIS — I2721 Secondary pulmonary arterial hypertension: Secondary | ICD-10-CM | POA: Diagnosis present

## 2024-02-12 LAB — COMPREHENSIVE METABOLIC PANEL WITH GFR
ALT: 31 U/L (ref 0–44)
AST: 39 U/L (ref 15–41)
Albumin: 3.7 g/dL (ref 3.5–5.0)
Alkaline Phosphatase: 274 U/L — ABNORMAL HIGH (ref 38–126)
Anion gap: 10 (ref 5–15)
BUN: 20 mg/dL (ref 8–23)
CO2: 26 mmol/L (ref 22–32)
Calcium: 10.2 mg/dL (ref 8.9–10.3)
Chloride: 102 mmol/L (ref 98–111)
Creatinine, Ser: 1.37 mg/dL — ABNORMAL HIGH (ref 0.61–1.24)
GFR, Estimated: 53 mL/min — ABNORMAL LOW (ref >60)
Glucose, Bld: 104 mg/dL — ABNORMAL HIGH (ref 70–99)
Potassium: 4.2 mmol/L (ref 3.5–5.1)
Sodium: 138 mmol/L (ref 135–145)
Total Bilirubin: 1.2 mg/dL (ref 0.0–1.2)
Total Protein: 7.5 g/dL (ref 6.5–8.1)

## 2024-02-12 LAB — CBC
HCT: 40.3 % (ref 39.0–52.0)
Hemoglobin: 12.4 g/dL — ABNORMAL LOW (ref 13.0–17.0)
MCH: 28.6 pg (ref 26.0–34.0)
MCHC: 30.8 g/dL (ref 30.0–36.0)
MCV: 93.1 fL (ref 80.0–100.0)
Platelets: 94 10*3/uL — ABNORMAL LOW (ref 150–400)
RBC: 4.33 MIL/uL (ref 4.22–5.81)
RDW: 14.9 % (ref 11.5–15.5)
WBC: 4.7 10*3/uL (ref 4.0–10.5)
nRBC: 0 % (ref 0.0–0.2)

## 2024-02-12 LAB — BRAIN NATRIURETIC PEPTIDE: B Natriuretic Peptide: 1030.7 pg/mL — ABNORMAL HIGH (ref 0.0–100.0)

## 2024-02-12 LAB — TROPONIN I (HIGH SENSITIVITY)
Troponin I (High Sensitivity): 11 ng/L (ref <18)
Troponin I (High Sensitivity): 10 ng/L (ref <18)

## 2024-02-12 LAB — MAGNESIUM: Magnesium: 2.1 mg/dL (ref 1.7–2.4)

## 2024-02-12 LAB — PHOSPHORUS: Phosphorus: 3.2 mg/dL (ref 2.5–4.6)

## 2024-02-12 MED ORDER — METOPROLOL SUCCINATE ER 25 MG PO TB24
50.0000 mg | ORAL_TABLET | Freq: Every day | ORAL | Status: DC
  Administered 2024-02-13 – 2024-02-14 (×2): 50 mg via ORAL
  Filled 2024-02-12 (×2): qty 1

## 2024-02-12 MED ORDER — FUROSEMIDE 10 MG/ML IJ SOLN
40.0000 mg | Freq: Once | INTRAMUSCULAR | Status: AC
  Administered 2024-02-12: 40 mg via INTRAVENOUS
  Filled 2024-02-12: qty 4

## 2024-02-12 MED ORDER — MELATONIN 5 MG PO TABS
5.0000 mg | ORAL_TABLET | Freq: Every evening | ORAL | Status: DC | PRN
  Administered 2024-02-12: 5 mg via ORAL
  Filled 2024-02-12: qty 1

## 2024-02-12 MED ORDER — ACETAMINOPHEN 325 MG PO TABS: 650.0000 mg | ORAL_TABLET | Freq: Four times a day (QID) | ORAL | Status: DC | PRN

## 2024-02-12 MED ORDER — ALBUTEROL SULFATE (2.5 MG/3ML) 0.083% IN NEBU
5.0000 mg | INHALATION_SOLUTION | Freq: Once | RESPIRATORY_TRACT | Status: AC
  Administered 2024-02-12: 5 mg via RESPIRATORY_TRACT
  Filled 2024-02-12: qty 6

## 2024-02-12 MED ORDER — BACLOFEN 10 MG PO TABS
5.0000 mg | ORAL_TABLET | Freq: Every day | ORAL | Status: DC
  Administered 2024-02-12 – 2024-02-13 (×2): 5 mg via ORAL
  Filled 2024-02-12 (×3): qty 1

## 2024-02-12 MED ORDER — ONDANSETRON HCL 4 MG/2ML IJ SOLN: 4.0000 mg | Freq: Four times a day (QID) | INTRAMUSCULAR | Status: DC | PRN

## 2024-02-12 MED ORDER — FOLIC ACID 1 MG PO TABS
1.0000 mg | ORAL_TABLET | Freq: Every day | ORAL | Status: DC
  Administered 2024-02-13 – 2024-02-14 (×2): 1 mg via ORAL
  Filled 2024-02-12 (×2): qty 1

## 2024-02-12 MED ORDER — ACETAMINOPHEN 650 MG RE SUPP
650.0000 mg | Freq: Four times a day (QID) | RECTAL | Status: DC | PRN
Start: 2024-02-12 — End: 2024-02-15

## 2024-02-12 MED ORDER — POTASSIUM CHLORIDE CRYS ER 20 MEQ PO TBCR
40.0000 meq | EXTENDED_RELEASE_TABLET | Freq: Once | ORAL | Status: AC
  Administered 2024-02-12: 40 meq via ORAL
  Filled 2024-02-12: qty 2

## 2024-02-12 MED ORDER — ONDANSETRON HCL 4 MG PO TABS: 4.0000 mg | ORAL_TABLET | Freq: Four times a day (QID) | ORAL | Status: DC | PRN

## 2024-02-12 MED ORDER — APIXABAN 5 MG PO TABS
5.0000 mg | ORAL_TABLET | Freq: Two times a day (BID) | ORAL | Status: DC
  Administered 2024-02-12 – 2024-02-14 (×4): 5 mg via ORAL
  Filled 2024-02-12 (×4): qty 1

## 2024-02-12 MED ORDER — DALFAMPRIDINE ER 10 MG PO TB12
10.0000 mg | ORAL_TABLET | Freq: Two times a day (BID) | ORAL | Status: DC
  Administered 2024-02-12 – 2024-02-14 (×4): 10 mg via ORAL
  Filled 2024-02-12 (×7): qty 1

## 2024-02-12 MED ORDER — QUETIAPINE FUMARATE 50 MG PO TABS
25.0000 mg | ORAL_TABLET | Freq: Every day | ORAL | Status: DC
  Administered 2024-02-12 – 2024-02-13 (×2): 50 mg via ORAL
  Filled 2024-02-12: qty 2
  Filled 2024-02-12 (×2): qty 1

## 2024-02-12 NOTE — ED Triage Notes (Signed)
 Pt reports with shob and cough x 1 week with yellow mucus. Pt reports recently having a chest xray. (15 mins ago), stating that they saw fluid on his lungs.

## 2024-02-12 NOTE — ED Provider Notes (Signed)
 [name] Provider Note   CSN: [id number] Arrival date & time: 02/12/24  1057     History  Chief Complaint  Patient presents with   Shortness of Breath    [name] is a 78 y.o. male.  Patient with h/o atrial fib with a slow VR s/p PPM Insertion on anticoagulation with apixaban , chronic renal insufficiency, chronic mixed heart failure and severe TR, s/p aortic dissection repair (2000) --presents to the emergency department today for evaluation of worsening shortness of breath, cough.  He has had increasing cough, productive of yellow-white sputum over the past 2 to 3 days.  He had more milder symptoms prior to this.  Patient has been prescribed Lasix .  He was last on this for 3 days ~ April 20.  He called cardiology for instructions, decided to dc and has not had since.  Patient has had mild worsening of swelling in the lower extremities.  Last night he had difficulty sleeping due to cough and estimates he did not sleep for more than 4 hours.  He has an adjustable bed that he can sleep propped up.   Echo 07/2023: Global hypokinesis with abnormal septal motion Signficant decrease in EF since TTE done 07/16/22. Left ventricular ejection fraction, by estimation, is 30 to 35%. The left ventricle has moderately decreased function. The left ventricle  demonstrates global hypokinesis. The left ventricular internal cavity size was moderately dilated. There is mild left ventricular hypertrophy. Left ventricular diastolic parameters are indeterminate.         Home Medications Prior to Admission medications   Medication Sig Start Date End Date Taking? Authorizing Provider  acetaminophen  (TYLENOL ) 325 MG tablet Take 2 tablets (650 mg total) by mouth every 6 (six) hours as needed for mild pain (or Fever >/= 101). 10/02/22   Singh, Prashant K, MD  amLODipine-valsartan (EXFORGE) 5-320 MG tablet Take 1 tablet by mouth daily. 12/31/22   [provider]  apixaban  (ELIQUIS ) 5 MG TABS tablet Take 1 tablet (5 mg total) by mouth 2 (two) times daily. NEEDS FOLLOW UP APPOINTMENT FOR MORE REFILLS 11/12/23   Bensimhon, Rheta Celestine, MD  baclofen  (LIORESAL ) 10 MG tablet Take 0.5 tablets (5 mg total) by mouth at bedtime. 2nd prn 03/02/23   Penumalli, Vikram R, MD  Cholecalciferol  (VITAMIN D3) 2000 UNITS TABS Take 2,000 Units by mouth daily.    [provider]  COPAXONE  40 MG/ML SOSY INJECT ONE SYRINGE SUBCUTANEOUSLY 3 TIMES A WEEK AT LEAST 48 HOURS APART. ALLOW TO WARM TO ROOM TEMP FOR 20 MINUTES. REFRIGERATE. 04/07/23   Penumalli, Brenton Cambridge, MD  dalfampridine  10 MG TB12 Take 1 tablet (10 mg total) by mouth 2 (two) times daily. Approximately 12 hours apart. May be taken with or without food. Do not crush and store at room temp. 03/02/23   Penumalli, Brenton Cambridge, MD  ferrous sulfate  325 (65 FE) MG tablet Take 325 mg by mouth daily with breakfast. 09/18/13   [provider]  folic acid  (FOLVITE ) 1 MG tablet Take 1 mg by mouth daily. Patient not taking: Reported on 05/24/2023    [provider]  furosemide  (LASIX ) 40 MG tablet Take 1 tablet (40 mg total) by mouth daily. 01/05/24 04/04/24  Tammie Fall, MD  losartan  (COZAAR ) 100 MG tablet Take 100 mg by mouth daily. Patient not taking: Reported on 03/02/2023    [provider]  metoprolol  succinate (TOPROL  XL) 50 MG 24 hr tablet Take 50 mg  by mouth daily. Take with or immediately following a meal.    [provider]  Multiple Vitamins-Minerals (MULTIVITAMIN MEN) TABS Take 1 tablet by mouth daily.    [provider]  QUEtiapine  (SEROQUEL ) 25 MG tablet Take 25-50 mg by mouth at bedtime.    [provider]      Allergies    Farxiga  Argus.Basta ]    Review of Systems   Review of Systems  Physical Exam Updated Vital Signs BP (!) 160/82 (BP Location: Left Arm)   Pulse 79   Temp 98.1 F (36.7 C) (Oral)   Resp (!) 22   Ht 5\' 8"  (1.727 m)   Wt  77.1 kg   SpO2 96%   BMI 25.84 kg/m   Physical Exam Vitals and nursing note reviewed.  Constitutional:      Appearance: He is well-developed. He is not diaphoretic.  HENT:     Head: Normocephalic and atraumatic.     Mouth/Throat:     Mouth: Mucous membranes are not dry.  Eyes:     Conjunctiva/sclera: Conjunctivae normal.  Neck:     Vascular: Normal carotid pulses. JVD present. No carotid bruit.     Trachea: Trachea normal. No tracheal deviation.  Cardiovascular:     Rate and Rhythm: Normal rate and regular rhythm.     Pulses: No decreased pulses.          Radial pulses are 2+ on the right side and 2+ on the left side.     Heart sounds: Normal heart sounds, S1 normal and S2 normal. Heart sounds not distant. No murmur heard. Pulmonary:     Effort: Pulmonary effort is normal. No respiratory distress.     Breath sounds: Examination of the right-upper field reveals wheezing. Examination of the left-upper field reveals wheezing. Examination of the right-middle field reveals wheezing. Examination of the left-middle field reveals wheezing. Examination of the right-lower field reveals decreased breath sounds. Decreased breath sounds, wheezing (Scattered expiratory, upper fields) and rhonchi present. No rales.     Comments: Frequent wet sounding cough during exam Chest:     Chest wall: No tenderness.  Abdominal:     General: Bowel sounds are normal.     Palpations: Abdomen is soft.     Tenderness: There is no abdominal tenderness. There is no guarding or rebound.  Musculoskeletal:     Cervical back: Normal range of motion and neck supple. No muscular tenderness.     Right lower leg: Edema present.     Left lower leg: Edema present.     Comments: 1+ pitting edema to the mid ankles bilaterally  Skin:    General: Skin is warm and dry.     Coloration: Skin is not pale.  Neurological:     Mental Status: He is alert. Mental status is at baseline.  Psychiatric:        Mood and Affect: Mood  normal.     ED Results / Procedures / Treatments   Labs (all labs ordered are listed, but only abnormal results are displayed) Labs Reviewed  CBC - Abnormal; Notable for the following components:      Result Value   Hemoglobin 12.4 (*)    Platelets 94 (*)    All other components within normal limits  COMPREHENSIVE METABOLIC PANEL WITH GFR - Abnormal; Notable for the following components:   Glucose, Bld 104 (*)    Creatinine, Ser 1.37 (*)    Alkaline Phosphatase 274 (*)    GFR, Estimated  53 (*)    All other components within normal limits  BRAIN NATRIURETIC PEPTIDE - Abnormal; Notable for the following components:   B Natriuretic Peptide 1,030.7 (*)    All other components within normal limits  TROPONIN I (HIGH SENSITIVITY)  TROPONIN I (HIGH SENSITIVITY)    EKG None  Radiology DG Chest Portable 1 View Result Date: 02/12/2024 CLINICAL DATA:  Productive cough and shortness of breath for 1 week. EXAM: PORTABLE CHEST 1 VIEW COMPARISON:  09/29/2022 FINDINGS: Stable cardiomegaly. Permanent pacemaker remains in place. Previous median sternotomy. New moderate to large right pleural effusion and right basilar atelectasis. Left lung is clear. IMPRESSION: New moderate to large right pleural effusion and right basilar atelectasis. Electronically Signed   By: Marlyce Sine M.D.   On: 02/12/2024 11:59    Procedures Procedures    Medications Ordered in ED Medications  albuterol (PROVENTIL) (2.5 MG/3ML) 0.083% nebulizer solution 5 mg (5 mg Nebulization Given 02/12/24 1134)  furosemide  (LASIX ) injection 40 mg (40 mg Intravenous Given 02/12/24 1324)    ED Course/ Medical Decision Making/ A&P    Patient seen and examined. History obtained directly from patient.  Reviewed outpatient cardiology notes and urgent care notes.  Labs/EKG: Ordered CBC, CMP, troponin, BNP.  Imaging: Personally reviewed and interpreted 1 view chest x-ray, large right-sided pleural effusion  noted  Medications/Fluids: Ordered: Albuterol 5 mg.  Most recent vital signs reviewed and are as follows: BP (!) 160/82 (BP Location: Left Arm)   Pulse 79   Temp 98.1 F (36.7 C) (Oral)   Resp (!) 22   Ht 5\' 8"  (1.727 m)   Wt 77.1 kg   SpO2 96%   BMI 25.84 kg/m   Initial impression: Cough and shortness of breath, there is certainly an element of worsening congestive heart failure with this.  Difficult to rule out bronchospasm, respiratory infection as well.  1:21 PM Reassessment performed. Patient appears stable.  Frequent coughing.  Sitting up.  Labs personally reviewed and interpreted including: CBC normal white blood cell count, slightly low hemoglobin at 12.4, platelets are low at 94; CMP creatinine improving 1.37, alkaline phosphatase 274; BNP elevated above baseline at 1030; troponin normal at 11.  Imaging personally visualized and interpreted including: New moderate to large right pleural effusion  Reviewed pertinent lab work and imaging with patient at bedside. Questions answered.   Most current vital signs reviewed and are as follows: BP (!) 160/82 (BP Location: Left Arm)   Pulse 79   Temp 98.1 F (36.7 C) (Oral)   Resp (!) 22   Ht 5\' 8"  (1.727 m)   Wt 77.1 kg   SpO2 96%   BMI 25.84 kg/m   Plan: Will begin IV diuresis.  Will request admission to hospital.                                  Medical Decision Making Amount and/or Complexity of Data Reviewed Labs: ordered. Radiology: ordered.  Risk Prescription drug management.   Patient with increasing SOB >> new large right sided effusion contributing. CHF likely underlying etiology esp since no diuretic for > 1 month, but other causes will need to be considered. Doubt infection at this time. EF was down on most recent ECHO. Some wheezing on arrival as well.          Final Clinical Impression(s) / ED Diagnoses Final diagnoses:  Pleural effusion on right  Shortness of breath  Rx / DC  Orders ED Discharge Orders     None         Lyna Sandhoff, PA-C 02/12/24 1336    Albertus Hughs, DO 02/12/24 1340

## 2024-02-12 NOTE — H&P (Signed)
 History and Physical    Patient: [name] JYN:[id number] DOB: [birth year] DOA: 02/12/2024 DOS: the patient was seen and examined on 02/12/2024 PCP: Jearldine Mina, MD  Patient coming from: Home  Chief Complaint:  Chief Complaint  Patient presents with   Shortness of Breath   HPI: KWESI Henry is a 78 y.o. male with medical history significant of anemia, aortic arch aneurysm, arctic dissection, history of emergency repair with breast suspension of native aortic valve, paroxysmal atrial fibrillation, bilateral lower extremity edema, history of CHF, mitral regurgitation, severe tricuspid valve regurgitation, hypertension, history of recurrent right pleural effusion, depression, hyperlipidemia, BPH, hemorrhoids, multiple sclerosis who presented to the emergency department with complaints of dyspnea associated with productive cough of whitish sputum, orthopnea and bilateral lower extremity edema.  He has not been taking diuretics regularly. He has not been monitoring his sodium intake closely. He denied fever, chills, rhinorrhea, sore throat, wheezing or hemoptysis. No chest pain, palpitations, diaphoresis. No abdominal pain, nausea, emesis, diarrhea, constipation, melena or hematochezia. No flank pain, dysuria, frequency or hematuria. No polyuria, polydipsia, polyphagia or blurred vision.   Lab work: CBC showed a white count of 4.7, hemoglobin 12.4 g/dL platelets 94.  Troponin was 11 ng/L.  BNP 1030.7 pg/mL.  CMP showed alkaline phosphatase of 274 units/L, glucose 104 and creatinine 1.37 mg/dL.  Imaging: Portable 1 view chest radiograph showing new moderate to large right pleural effusion and right basilar atelectasis.   ED course: Initial vital signs were temperature 98.1 F, pulse 79, respirations 22, BP 160/82 mmHg O2 sat 96% on room air.  The patient received albuterol 5 mg via neb and furosemide  40 mg IVP x 1.  Review of Systems: As mentioned in the history of present illness. All  other systems reviewed and are negative. Past Medical History:  Diagnosis Date   Anemia    Aneurysm of aortic arch (HCC) 04/04/2012   Chronic aneurysmal dilatation of aortic arch with chronic type A aortic dissection, s/p replacement of ascending thoracic aorta   Aortic dissection (HCC) 11/05/1998   S/P emergency repair of acute type A aortic dissection with resuspension of native aortic valve   Aortic dissection, thoracic (HCC)    Aortic root aneurysm    Atrial fibrillation (HCC) 01/30/2013   Atrial fibrillation    Bilateral lower extremity edema    BPH (benign prostatic hyperplasia)    CHF (congestive heart failure) (HCC) 10-13-46   2D Echo - EF 50-55%, mild-moderately dilated right ventricle, mild-moderate tricuspid valve regurgitation, moderately dilated right atrium   Depression    Dyslipidemia    Exertional shortness of breath    "for awhile now" (09/11/2013)   Hemorrhoids    History of blood transfusion    Hypertension    Mitral regurgitation 02/20/2013   Multiple sclerosis (HCC)    Neuromuscular disorder (HCC)    Pleural effusion, right, large 01/30/2013   Severe tricuspid valve regurgitation 01/30/2013   Severe tricuspid regurg by recent 2-D echo with a dilated tricuspid annulus, moderate pulmonary hypertension and biatrial enlargement    Thrombocytopenia (HCC)    Past Surgical History:  Procedure Laterality Date   HEMORRHOID SURGERY  2009   Internal, external hemorrhoidectomy, general anesthesia,prone position. [Other]   Open reduction and internal fixation of right distal radius fracture using [employer] distal radius volar locking plate.  07/2005   Dr Lucienne Ryder   PACEMAKER IMPLANT N/A 09/28/2022   Procedure: PACEMAKER IMPLANT;  Surgeon: Tammie Fall, MD;  Location: [facility] INVASIVE CV LAB;  Service: Cardiovascular;  Laterality: N/A;   Repair of acute type A aortic dissection with resuspension of native aortic valve  10/15/1998   Dr Alva Jewels   TEE WITHOUT CARDIOVERSION N/A  02/17/2013   Procedure: TRANSESOPHAGEAL ECHOCARDIOGRAM (TEE);  Surgeon: Luana Rumple, MD;  Location: [facility] ENDOSCOPY;  Service: Cardiovascular;  Laterality: N/A;   TEMPORARY PACEMAKER N/A 09/26/2022   Procedure: TEMPORARY PACEMAKER;  Surgeon: Arnoldo Lapping, MD;  Location: [facility] INVASIVE CV LAB;  Service: Cardiovascular;  Laterality: N/A;   Social History:  reports that he has never smoked. He has never used smokeless tobacco. He reports current alcohol use. He reports that he does not use drugs.  Allergies  Allergen Reactions   Farxiga  [Dapagliflozin ] Other (See Comments)    B/P dropped too low and extreme vertigo    Family History  Problem Relation Age of Onset   Thyroid cancer Father        smoked   Heart attack Mother    Lung cancer Brother        smoked   Emphysema Brother        smoked    Prior to Admission medications   Medication Sig Start Date End Date Taking? Authorizing Provider  acetaminophen  (TYLENOL ) 325 MG tablet Take 2 tablets (650 mg total) by mouth every 6 (six) hours as needed for mild pain (or Fever >/= 101). 10/02/22   Singh, Prashant K, MD  amLODipine-valsartan (EXFORGE) 5-320 MG tablet Take 1 tablet by mouth daily. 12/31/22   [provider]  apixaban  (ELIQUIS ) 5 MG TABS tablet Take 1 tablet (5 mg total) by mouth 2 (two) times daily. NEEDS FOLLOW UP APPOINTMENT FOR MORE REFILLS 11/12/23   Bensimhon, Rheta Celestine, MD  baclofen  (LIORESAL ) 10 MG tablet Take 0.5 tablets (5 mg total) by mouth at bedtime. 2nd prn 03/02/23   Penumalli, Vikram R, MD  Cholecalciferol  (VITAMIN D3) 2000 UNITS TABS Take 2,000 Units by mouth daily.    [provider]  COPAXONE  40 MG/ML SOSY INJECT ONE SYRINGE SUBCUTANEOUSLY 3 TIMES A WEEK AT LEAST 48 HOURS APART. ALLOW TO WARM TO ROOM TEMP FOR 20 MINUTES. REFRIGERATE. 04/07/23   Penumalli, Brenton Cambridge, MD  dalfampridine  10 MG TB12 Take 1 tablet (10 mg total) by mouth 2 (two) times daily. Approximately 12 hours apart. May be taken with or  without food. Do not crush and store at room temp. 03/02/23   Penumalli, Brenton Cambridge, MD  ferrous sulfate  325 (65 FE) MG tablet Take 325 mg by mouth daily with breakfast. 09/18/13   [provider]  folic acid  (FOLVITE ) 1 MG tablet Take 1 mg by mouth daily. Patient not taking: Reported on 05/24/2023    [provider]  furosemide  (LASIX ) 40 MG tablet Take 1 tablet (40 mg total) by mouth daily. 01/05/24 04/04/24  Tammie Fall, MD  losartan  (COZAAR ) 100 MG tablet Take 100 mg by mouth daily. Patient not taking: Reported on 03/02/2023    [provider]  metoprolol  succinate (TOPROL  XL) 50 MG 24 hr tablet Take 50 mg by mouth daily. Take with or immediately following a meal.    [provider]  Multiple Vitamins-Minerals (MULTIVITAMIN MEN) TABS Take 1 tablet by mouth daily.    [provider]  QUEtiapine  (SEROQUEL ) 25 MG tablet Take 25-50 mg by mouth at bedtime.    [provider]    Physical Exam: Vitals:   02/12/24 1106 02/12/24 1109  BP: (!) 160/82   Pulse: 79   Resp: (!) 22  Temp: 98.1 F (36.7 C)   TempSrc: Oral   SpO2: 96%   Weight:  77.1 kg  Height:  5\' 8"  (1.727 m)   Physical Exam Vitals and nursing note reviewed.  Constitutional:      General: He is awake. He is not in acute distress.    Appearance: He is ill-appearing.     Interventions: Nasal cannula in place.  HENT:     Head: Normocephalic.     Nose: No rhinorrhea.     Mouth/Throat:     Mouth: Mucous membranes are moist.  Eyes:     General: No scleral icterus.    Pupils: Pupils are equal, round, and reactive to light.  Neck:     Vascular: JVD present.  Cardiovascular:     Rate and Rhythm: Normal rate and regular rhythm.     Heart sounds: S1 normal and S2 normal.  Pulmonary:     Effort: Pulmonary effort is normal. No accessory muscle usage or respiratory distress.     Breath sounds: Examination of the right-lower field reveals decreased breath sounds. Examination of  the left-lower field reveals rales. Decreased breath sounds, wheezing and rales present. No rhonchi.  Abdominal:     General: Bowel sounds are normal. There is no distension.     Palpations: Abdomen is soft.     Tenderness: There is no abdominal tenderness. There is no right CVA tenderness or left CVA tenderness.  Musculoskeletal:     Cervical back: Neck supple.     Right lower leg: Edema present.     Left lower leg: Edema present.  Skin:    General: Skin is warm and dry.  Neurological:     General: No focal deficit present.     Mental Status: He is alert and oriented to person, place, and time.  Psychiatric:        Mood and Affect: Mood normal.        Behavior: Behavior normal. Behavior is cooperative.    Data Reviewed:  Results are pending, will review when available. 08/11/2023 echocardiogram report.  IMPRESSIONS:   1. Global hypokinesis with abnormal septal motion Signficant decrease in  EF since TTE done 07/16/22 . Left ventricular ejection fraction, by  estimation, is 30 to 35%. The left ventricle has moderately decreased  function. The left ventricle demonstrates  global hypokinesis. The left ventricular internal cavity size was  moderately dilated. There is mild left ventricular hypertrophy. Left  ventricular diastolic parameters are indeterminate.   2. Pacing wires in RA and RV basal septum. Right ventricular systolic  function is normal. The right ventricular size is normal. There is  moderately elevated pulmonary artery systolic pressure.   3. Left atrial size was moderately dilated.   4. Right atrial size was severely dilated.   5. The mitral valve is abnormal. Moderate mitral valve regurgitation. No  evidence of mitral stenosis.   6. Tricuspid valve regurgitation is severe.   7. The aortic valve is tricuspid. Aortic valve regurgitation is moderate.  No aortic stenosis is present.   8. 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 Suggest CVTS  consultation  Prior type one dissection with intact ascending thoracic  aorta. Sinus of valsalva is known and measured at 6.1 cm on TTE 07/16/22  Degree of AR seems worse. Aortic dilatation  noted. There is severe dilatation of the aortic root, measuring 65 mm.   9. The inferior vena cava is dilated in size with <50% respiratory  variability, suggesting right atrial pressure  of 15 mmHg.   EKG: Vent. rate 77 BPM PR interval 63 ms QRS duration 170 ms QT/QTcB 452/512 ms P-R-T axes 0 -32 * Sinus rhythm Short PR interval Left bundle branch block  Assessment and Plan: Principal Problem:   Lower extremity edema In the setting of:   Acute on chronic systolic heart failure (HCC) Observation/telemetry. Supplemental oxygen as needed. Sodium and fluid restriction. Monitor daily weights, intake and output. Monitor renal function electrolytes. - Depending on these results: - Will furosemide   mg IVP twice daily.  Active Problems:   Thrombocytopenia (HCC) Monitor platelet count.    Anemia of chronic disease Monitor hematocrit hemoglobin.    Multiple sclerosis (HCC) Continue with dalfamprimidine 10 mg po BID. Continue baclofen  5 mg p.o. bedtime.    Paroxysmal atrial fibrillation (HCC) CHA?DS?-VASc Score of at least 5. Continue apixaban  5 mg p.o. twice daily. On metoprolol  for rate control.    Essential hypertension Continue metoprolol  succinate 50 mg p.o. daily.     Advance Care Planning:   Code Status: Full Code   Consults:   Family Communication:   Severity of Illness: The appropriate patient status for this patient is OBSERVATION. Observation status is judged to be reasonable and necessary in order to provide the required intensity of service to ensure the patient's safety. The patient's presenting symptoms, physical exam findings, and initial radiographic and laboratory data in the context of their medical condition is felt to place them at decreased risk for further clinical  deterioration. Furthermore, it is anticipated that the patient will be medically stable for discharge from the hospital within 2 midnights of admission.   Author: Danice Dural, MD 02/12/2024 1:32 PM  For on call review [email or web address].   This document was prepared using Dragon voice recognition software and may contain some unintended transcription errors.

## 2024-02-13 DIAGNOSIS — R579 Shock, unspecified: Secondary | ICD-10-CM | POA: Diagnosis not present

## 2024-02-13 DIAGNOSIS — J90 Pleural effusion, not elsewhere classified: Secondary | ICD-10-CM | POA: Diagnosis present

## 2024-02-13 DIAGNOSIS — Z7901 Long term (current) use of anticoagulants: Secondary | ICD-10-CM | POA: Diagnosis not present

## 2024-02-13 DIAGNOSIS — L89153 Pressure ulcer of sacral region, stage 3: Secondary | ICD-10-CM | POA: Diagnosis present

## 2024-02-13 DIAGNOSIS — I5023 Acute on chronic systolic (congestive) heart failure: Secondary | ICD-10-CM | POA: Diagnosis not present

## 2024-02-13 DIAGNOSIS — Z515 Encounter for palliative care: Secondary | ICD-10-CM | POA: Diagnosis not present

## 2024-02-13 DIAGNOSIS — R9431 Abnormal electrocardiogram [ECG] [EKG]: Secondary | ICD-10-CM | POA: Diagnosis not present

## 2024-02-13 DIAGNOSIS — D696 Thrombocytopenia, unspecified: Secondary | ICD-10-CM | POA: Diagnosis present

## 2024-02-13 DIAGNOSIS — J9601 Acute respiratory failure with hypoxia: Secondary | ICD-10-CM | POA: Diagnosis present

## 2024-02-13 DIAGNOSIS — R042 Hemoptysis: Secondary | ICD-10-CM | POA: Diagnosis not present

## 2024-02-13 DIAGNOSIS — N400 Enlarged prostate without lower urinary tract symptoms: Secondary | ICD-10-CM | POA: Diagnosis present

## 2024-02-13 DIAGNOSIS — J9811 Atelectasis: Secondary | ICD-10-CM | POA: Diagnosis present

## 2024-02-13 DIAGNOSIS — N1832 Chronic kidney disease, stage 3b: Secondary | ICD-10-CM | POA: Diagnosis present

## 2024-02-13 DIAGNOSIS — I480 Paroxysmal atrial fibrillation: Secondary | ICD-10-CM | POA: Diagnosis present

## 2024-02-13 DIAGNOSIS — E785 Hyperlipidemia, unspecified: Secondary | ICD-10-CM | POA: Diagnosis present

## 2024-02-13 DIAGNOSIS — I469 Cardiac arrest, cause unspecified: Secondary | ICD-10-CM | POA: Diagnosis not present

## 2024-02-13 DIAGNOSIS — I130 Hypertensive heart and chronic kidney disease with heart failure and stage 1 through stage 4 chronic kidney disease, or unspecified chronic kidney disease: Secondary | ICD-10-CM | POA: Diagnosis present

## 2024-02-13 DIAGNOSIS — R0602 Shortness of breath: Secondary | ICD-10-CM | POA: Diagnosis present

## 2024-02-13 DIAGNOSIS — D631 Anemia in chronic kidney disease: Secondary | ICD-10-CM | POA: Diagnosis present

## 2024-02-13 DIAGNOSIS — G35 Multiple sclerosis: Secondary | ICD-10-CM | POA: Diagnosis present

## 2024-02-13 DIAGNOSIS — I5043 Acute on chronic combined systolic (congestive) and diastolic (congestive) heart failure: Secondary | ICD-10-CM | POA: Diagnosis present

## 2024-02-13 DIAGNOSIS — G4700 Insomnia, unspecified: Secondary | ICD-10-CM | POA: Diagnosis present

## 2024-02-13 DIAGNOSIS — Z66 Do not resuscitate: Secondary | ICD-10-CM | POA: Diagnosis present

## 2024-02-13 DIAGNOSIS — I2721 Secondary pulmonary arterial hypertension: Secondary | ICD-10-CM | POA: Diagnosis present

## 2024-02-13 DIAGNOSIS — I2510 Atherosclerotic heart disease of native coronary artery without angina pectoris: Secondary | ICD-10-CM | POA: Diagnosis present

## 2024-02-13 DIAGNOSIS — Z79899 Other long term (current) drug therapy: Secondary | ICD-10-CM | POA: Diagnosis not present

## 2024-02-13 DIAGNOSIS — G9340 Encephalopathy, unspecified: Secondary | ICD-10-CM | POA: Diagnosis not present

## 2024-02-13 LAB — COMPREHENSIVE METABOLIC PANEL WITH GFR
ALT: 26 U/L (ref 0–44)
AST: 29 U/L (ref 15–41)
Albumin: 3.5 g/dL (ref 3.5–5.0)
Alkaline Phosphatase: 247 U/L — ABNORMAL HIGH (ref 38–126)
Anion gap: 7 (ref 5–15)
BUN: 21 mg/dL (ref 8–23)
CO2: 26 mmol/L (ref 22–32)
Calcium: 9.6 mg/dL (ref 8.9–10.3)
Chloride: 105 mmol/L (ref 98–111)
Creatinine, Ser: 1.28 mg/dL — ABNORMAL HIGH (ref 0.61–1.24)
GFR, Estimated: 58 mL/min — ABNORMAL LOW (ref >60)
Glucose, Bld: 104 mg/dL — ABNORMAL HIGH (ref 70–99)
Potassium: 4.3 mmol/L (ref 3.5–5.1)
Sodium: 138 mmol/L (ref 135–145)
Total Bilirubin: 1.1 mg/dL (ref 0.0–1.2)
Total Protein: 6.8 g/dL (ref 6.5–8.1)

## 2024-02-13 LAB — CBC
HCT: 39.6 % (ref 39.0–52.0)
Hemoglobin: 11.8 g/dL — ABNORMAL LOW (ref 13.0–17.0)
MCH: 28.2 pg (ref 26.0–34.0)
MCHC: 29.8 g/dL — ABNORMAL LOW (ref 30.0–36.0)
MCV: 94.5 fL (ref 80.0–100.0)
Platelets: 88 10*3/uL — ABNORMAL LOW (ref 150–400)
RBC: 4.19 MIL/uL — ABNORMAL LOW (ref 4.22–5.81)
RDW: 14.8 % (ref 11.5–15.5)
WBC: 4.5 10*3/uL (ref 4.0–10.5)
nRBC: 0 % (ref 0.0–0.2)

## 2024-02-13 MED ORDER — MELATONIN 5 MG PO TABS
5.0000 mg | ORAL_TABLET | Freq: Every day | ORAL | Status: DC
  Administered 2024-02-13: 5 mg via ORAL
  Filled 2024-02-13: qty 1

## 2024-02-13 MED ORDER — MEDIHONEY WOUND/BURN DRESSING EX PSTE
1.0000 | PASTE | Freq: Every day | CUTANEOUS | Status: DC
  Administered 2024-02-13: 1 via TOPICAL
  Filled 2024-02-13: qty 44

## 2024-02-13 MED ORDER — SENNOSIDES-DOCUSATE SODIUM 8.6-50 MG PO TABS
1.0000 | ORAL_TABLET | Freq: Every day | ORAL | Status: DC
  Administered 2024-02-13: 1 via ORAL
  Filled 2024-02-13: qty 1

## 2024-02-13 MED ORDER — POLYETHYLENE GLYCOL 3350 17 G PO PACK
17.0000 g | PACK | Freq: Every day | ORAL | Status: DC | PRN
Start: 2024-02-13 — End: 2024-02-15
  Administered 2024-02-13: 17 g via ORAL
  Filled 2024-02-13: qty 1

## 2024-02-13 MED ORDER — GUAIFENESIN ER 600 MG PO TB12
600.0000 mg | ORAL_TABLET | Freq: Two times a day (BID) | ORAL | Status: DC
  Administered 2024-02-13 – 2024-02-14 (×3): 600 mg via ORAL
  Filled 2024-02-13 (×3): qty 1

## 2024-02-13 MED ORDER — FUROSEMIDE 10 MG/ML IJ SOLN
40.0000 mg | Freq: Two times a day (BID) | INTRAMUSCULAR | Status: DC
  Administered 2024-02-13 – 2024-02-14 (×3): 40 mg via INTRAVENOUS
  Filled 2024-02-13 (×3): qty 4

## 2024-02-13 NOTE — Progress Notes (Signed)
 Interventional Radiology Brief Note:  US  sent for patient for thoracentesis, however, per RN, patient not available at this time.  Will re-visit for possible thoracentesis tomorrow, 6/2.  Derick Seminara, MS RD PA-C ,

## 2024-02-13 NOTE — Care Management Obs Status (Signed)
 MEDICARE OBSERVATION STATUS NOTIFICATION   Patient Details  Name: [name] MRN: [id number] Date of Birth: 22-Mar-1946   Medicare Observation Status Notification Given:  Yes    Levie Ream, RN 02/13/2024, 2:05 PM

## 2024-02-13 NOTE — Progress Notes (Signed)
 PROGRESS NOTE  [name]  DOB: 14-Sep-1946  PCP: Jearldine Mina, MD ZOX:[id number]  DOA: 02/12/2024  LOS: 0 days  Hospital Day: 2  Brief narrative: [name] is a 78 y.o. male with PMH significant for multiple sclerosis, HTN, HLD, slow A-fib s/p PPM, on Eliquis , mild systolic CHF, severe TR, moderate pulm artery hypertension, aortic dissection s/p emergency repair 2000, recurrent right pleural effusion, CKD  5/31, patient presented to the ED with complaint of dyspnea, productive cough of whitish sputum, orthopnea, progressive bilateral edema for 1 week.  Reports noncompliance to diuretics.  Most recent echo from 2024 with LV global hypokinesis, EF 30 to 35%.  In the ED, patient afebrile, blood pressure elevated to 160/82, breathing on room air Labs with potassium 4.7, hemoglobin 12.4, platelet 94, BNP elevated over 1000, troponin negative, BN/creatinine 20/1.37 Chest x-ray showed new moderate to large right pleural effusion and right basilar atelectasis. Patient was given 1 dose of IV Lasix  40 mg Admitted to TRH  Subjective: Patient was seen and examined this morning.  Pleasant elderly Caucasian male.  Propped up in bed.  On 1 L oxygen by nasal cannula at rest. Says he could not rest well last night because he did not get his regular dose of melatonin and frequent visits by nurses. No family at bedside Blood pressure this morning 149/78 Labs this morning with BUN/creatinine 21/1.28  Assessment and plan: Acute exacerbation of chronic systolic CHF Large right pleural effusion Presented with 1 week of dyspnea, pedal edema in the setting of known systolic CHF and noncompliance to diuretics Initial labs with BNP elevated over 1000 Started on IV Lasix  40 mg twice daily. Pending repeat echocardiogram Continue Toprol  50 mg daily as before.  Continue to hold amlodipine/valsartan Fluid restriction of 1200 mL/day to follow Ultrasound-guided thoracentesis requested. Continue  to monitor for daily intake output, weight, blood pressure, BNP, renal function and electrolytes. Net IO Since Admission: -640 mL [02/13/24 1106] Recent Labs  Lab 02/12/24 1128 02/12/24 1415 02/13/24 0518  BNP 1,030.7*  --   --   BUN 20  --  21  CREATININE 1.37*  --  1.28*  NA 138  --  138  K 4.2  --  4.3  MG  --  2.1  --    Acute respiratory failure with hypoxia Not on supplemental oxygen at home.  Currently requiring 1 to 2 L. Has significant cough on trying to take a deep breath.  Start Mucinex, incentive spirometry  Paroxysmal A-fib On Toprol  50 mg daily.  Patient has a permanent pacemaker in place Continue chronic anticoagulation with Eliquis   Mild chronic anemia Hemoglobin stable Recent Labs    02/12/24 1128 02/13/24 0518  HGB 12.4* 11.8*  MCV 93.1 94.5   Thrombocytopenia Platelet low but stable.  No active bleeding.  Okay to continue Eliquis  for now Recent Labs  Lab 02/12/24 1128 02/13/24 0518  PLT 94* 88*    Multiple sclerosis Continue with dalfamprimidine, baclofen . Also seems to her Copaxone  MWF injection at home.  Insomnia Says he takes Seroquel  25 to 50 mg and melatonin 5 to 10 mg nightly.  Constipation Scheduled and as needed bowel regimen ordered    Mobility: PT eval will be helpful  Goals of care   Code Status: Full Code     DVT prophylaxis:   apixaban  (ELIQUIS ) tablet 5 mg   Antimicrobials: None Fluid: None Consultants: None Family Communication: None at side  Status: Observation Level of care:  Telemetry   Patient  is from: Home Needs to continue in-hospital care: Needs to continue diuresis Anticipated d/c to: Hopefully home in 2 to 3 days      Diet:  Diet Order             Diet Heart Room service appropriate? Yes; Fluid consistency: Thin; Fluid restriction: 1200 mL Fluid  Diet effective now                   Scheduled Meds:  apixaban   5 mg Oral BID   baclofen   5 mg Oral QHS   dalfampridine   10 mg Oral BID    folic acid   1 mg Oral Daily   furosemide   40 mg Intravenous BID   guaiFENesin  600 mg Oral BID   leptospermum manuka honey  1 Application Topical Daily   melatonin  5 mg Oral QHS   metoprolol  succinate  50 mg Oral Daily   QUEtiapine   25-50 mg Oral QHS   senna-docusate  1 tablet Oral QHS    PRN meds: acetaminophen  **OR** acetaminophen , melatonin, ondansetron  **OR** ondansetron  (ZOFRAN ) IV, polyethylene glycol   Infusions:    Antimicrobials: Anti-infectives (From admission, onward)    None       Objective: Vitals:   02/13/24 0025 02/13/24 0454  BP: 136/64 (!) 149/78  Pulse: 88 81  Resp: 18 18  Temp: 97.7 F (36.5 C) 98.8 F (37.1 C)  SpO2: 98% 95%    Intake/Output Summary (Last 24 hours) at 02/13/2024 1106 Last data filed at 02/13/2024 0841 Gross per 24 hour  Intake 360 ml  Output 1000 ml  Net -640 ml   Filed Weights   02/12/24 1109 02/12/24 1547 02/13/24 0500  Weight: 77.1 kg 71.3 kg 73 kg   Weight change:  Body mass index is 24.47 kg/m.   Physical Exam: General exam: Pleasant, elderly Caucasian male.  Not in distress currently Skin: No rashes, lesions or ulcers. HEENT: Atraumatic, normocephalic, no obvious bleeding Lungs: Clear to auscultation bilaterally, coughs violently on deep breathing CVS: S1, S2, no murmur,   GI/Abd: Soft, nontender, nondistended, bowel sound present,   CNS: Alert, awake, oriented x 3 Psychiatry: Mood appropriate,  Extremities: 1+ bilateral pedal edema, no calf tenderness,   Data Review: I have personally reviewed the laboratory data and studies available.  F/u labs  Unresulted Labs (From admission, onward)     Start     Ordered   02/14/24 0500  Basic metabolic panel with GFR  Tomorrow morning,   R       Question:  Specimen collection method  Answer:  Lab=Lab collect   02/13/24 1106   02/14/24 0500  CBC with Differential/Platelet  Tomorrow morning,   R       Question:  Specimen collection method  Answer:  Lab=Lab collect    02/13/24 1106   02/14/24 0500  Magnesium  Tomorrow morning,   R       Question:  Specimen collection method  Answer:  Lab=Lab collect   02/13/24 1106   02/14/24 0500  Phosphorus  Tomorrow morning,   R       Question:  Specimen collection method  Answer:  Lab=Lab collect   02/13/24 1106            Admission date and time: 02/12/2024 10:59 AM    Signed, Timothy Macleod, MD [facility] 02/13/2024

## 2024-02-13 NOTE — TOC Initial Note (Signed)
 Transition of Care [facility]) - Initial/Assessment Note    Patient Details  Name: [name] MRN: [id number] Date of Birth: 06/12/1946  Transition of Care [facility]) CM/SW Contact:    Levie Ream, RN Phone Number: 02/13/2024, 2:08 PM  Clinical Narrative:                 Eldora Greet w/ pt in room; pt says he lives at home; he plans to return at d/c; he identified POC Mattis Featherly (spouse) [phone]; pt says he will arrange transportation; he verified insurance/PCP; pt denied SDOH risks; he has a walker; pt says he does not have HH services, or home oxygen; awaiting PT eval; TOC is following.  Expected Discharge Plan: Home/Self Care Barriers to Discharge: Continued Medical Work up   Patient Goals and CMS Choice Patient states their goals for this hospitalization and ongoing recovery are:: home CMS Medicare.gov Compare Post Acute Care list provided to:: Patient   [place] ownership interest in [facility].provided to:: Patient    Expected Discharge Plan and Services   Discharge Planning Services: CM Consult   Living arrangements for the past 2 months: Single Family Home                                      Prior Living Arrangements/Services Living arrangements for the past 2 months: Single Family Home Lives with:: Spouse Patient language and need for interpreter reviewed:: Yes Do you feel safe going back to the place where you live?: Yes      Need for Family Participation in Patient Care: Yes (Comment) Care giver support system in place?: Yes (comment) Current home services: DME (walker) Criminal Activity/Legal Involvement Pertinent to Current Situation/Hospitalization: No - Comment as needed  Activities of Daily Living   ADL Screening (condition at time of admission) Independently performs ADLs?: No Does the patient have a NEW difficulty with bathing/dressing/toileting/self-feeding that is expected to last >3 days?: No Does the patient have a  NEW difficulty with getting in/out of bed, walking, or climbing stairs that is expected to last >3 days?: No Does the patient have a NEW difficulty with communication that is expected to last >3 days?: No Is the patient deaf or have difficulty hearing?: No Does the patient have difficulty seeing, even when wearing glasses/contacts?: No Does the patient have difficulty concentrating, remembering, or making decisions?: No  Permission Sought/Granted Permission sought to share information with : Case Manager Permission granted to share information with : Yes, Verbal Permission Granted  Share Information with NAME: Case Manager     Permission granted to share info w Relationship: Macallan Ord (spouse) [phone]     Emotional Assessment Appearance:: Appears stated age Attitude/Demeanor/Rapport: Gracious Affect (typically observed): Accepting Orientation: : Oriented to Self, Oriented to Place, Oriented to  Time, Oriented to Situation Alcohol / Substance Use: Not Applicable Psych Involvement: No (comment)  Admission diagnosis:  Shortness of breath [R06.02] Acute on chronic systolic heart failure (HCC) [I50.23] Pleural effusion on right [J90] Patient Active Problem List   Diagnosis Date Noted   Acute on chronic systolic heart failure (HCC) 02/12/2024   Normocytic anemia 02/12/2024   Bradycardia 09/27/2022   Pacemaker 09/27/2022   Symptomatic bradycardia 09/26/2022   Paroxysmal atrial fibrillation (HCC) 09/26/2022   Essential hypertension 09/26/2022   Acute renal failure superimposed on stage 3b chronic kidney disease (HCC) 09/25/2022   Aortic root aneurysm    H/O  aortic dissection 02/13/2016   Aneurysm of aortic arch (HCC) 12/31/2014   Tricuspid regurgitation 10/08/2014   RVF (right ventricular failure) (HCC) 10/08/2014   Chronic diastolic CHF (congestive heart failure) (HCC) 10/09/2013   Sinus pause 09/16/2013   Anemia due to blood loss, acute in setting of supratheraputic INR  09/11/13 09/12/2013   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 09/12/2013   Acute renal insufficiency 09/12/2013   Anasarca 09/12/2013   Anemia of chronic disease 09/12/2013   Bilateral leg edema 09/11/2013   Acute right-sided congestive heart failure (HCC) 09/11/2013   Unstable gait 09/11/2013   Dissection of aorta, thoracic- surgery Feb 2000 02/20/2013   Mitral regurgitation 02/20/2013   Lower extremity edema 02/20/2013   Volume overload 01/30/2013   Severe tricuspid valve regurgitation 01/30/2013   Pleural effusion, right, large 01/30/2013   Multiple sclerosis (HCC) 01/13/2013   Thrombocytopenia (HCC) 07/26/2011   Aortic dissection- chronic abd dissection  11/05/1998   PCP:  Jearldine Mina, MD Pharmacy:   [facility] [place], [place] - [address] [facility] [address] [place] [address] Phone: [phone] Fax: [phone]  [facility] [place], [place] - [address] [place] [address] Phone: [phone] Fax: [phone]     Social Drivers of Health (SDOH) Social History: SDOH Screenings   Food Insecurity: No Food Insecurity (02/13/2024)  Housing: Low Risk  (02/13/2024)  Transportation Needs: No Transportation Needs (02/13/2024)  Utilities: Not At Risk (02/13/2024)  Financial Resource Strain: Low Risk  (07/07/2018)  Social Connections: Socially Integrated (02/12/2024)  Tobacco Use: Low Risk  (02/12/2024)   SDOH Interventions: Food Insecurity Interventions: Intervention Not Indicated, Inpatient TOC Housing Interventions: Intervention Not Indicated, Inpatient TOC Transportation Interventions: Intervention Not Indicated, Inpatient TOC Utilities Interventions: Intervention Not Indicated, Inpatient TOC   Readmission Risk Interventions     No data to display

## 2024-02-13 NOTE — Progress Notes (Signed)
 TOC consulted for HF Home Health Screen; orders previously placed for Heart Failure Navigation Team.

## 2024-02-13 NOTE — Consult Note (Addendum)
 WOC Nurse Consult Note: Reason for Consult: Pressure Injury  Wound type: Stage 3 Pressure Injury sacrum  Pressure Injury POA: Yes Measurement: see nursing flowsheet  Wound bed: 2 open areas separated by intact skin, superior appears 100% pink moist, distal  80% red 20% yellow  Drainage (amount, consistency, odor) see nursing flowsheet  Periwound: Dressing procedure/placement/frequency: Cleanse sacral wound with NS, apply Medihoney to wound bed daily, cover with dry gauze and secure with silicone foam or ABD pad whichever is preferred.   POC discussed with bedside nurse. WOC team will not follow. Re-consult if further needs arise.   Thank you,    Ronni Colace MSN, RN-BC, [employer] [phone]

## 2024-02-13 DEATH — deceased

## 2024-02-14 ENCOUNTER — Inpatient Hospital Stay (HOSPITAL_COMMUNITY)

## 2024-02-14 DIAGNOSIS — I469 Cardiac arrest, cause unspecified: Secondary | ICD-10-CM | POA: Diagnosis not present

## 2024-02-14 DIAGNOSIS — J90 Pleural effusion, not elsewhere classified: Secondary | ICD-10-CM

## 2024-02-14 DIAGNOSIS — G9340 Encephalopathy, unspecified: Secondary | ICD-10-CM

## 2024-02-14 DIAGNOSIS — J9601 Acute respiratory failure with hypoxia: Secondary | ICD-10-CM | POA: Diagnosis not present

## 2024-02-14 DIAGNOSIS — R9431 Abnormal electrocardiogram [ECG] [EKG]: Secondary | ICD-10-CM | POA: Diagnosis not present

## 2024-02-14 DIAGNOSIS — I5020 Unspecified systolic (congestive) heart failure: Secondary | ICD-10-CM

## 2024-02-14 DIAGNOSIS — D696 Thrombocytopenia, unspecified: Secondary | ICD-10-CM

## 2024-02-14 DIAGNOSIS — I5023 Acute on chronic systolic (congestive) heart failure: Secondary | ICD-10-CM | POA: Diagnosis not present

## 2024-02-14 LAB — CBC WITH DIFFERENTIAL/PLATELET
Abs Immature Granulocytes: 0.02 10*3/uL (ref 0.00–0.07)
Basophils Absolute: 0 10*3/uL (ref 0.0–0.1)
Basophils Relative: 1 %
Eosinophils Absolute: 0 10*3/uL (ref 0.0–0.5)
Eosinophils Relative: 0 %
HCT: 35.3 % — ABNORMAL LOW (ref 39.0–52.0)
Hemoglobin: 10.8 g/dL — ABNORMAL LOW (ref 13.0–17.0)
Immature Granulocytes: 1 %
Lymphocytes Relative: 10 %
Lymphs Abs: 0.4 10*3/uL — ABNORMAL LOW (ref 0.7–4.0)
MCH: 28.5 pg (ref 26.0–34.0)
MCHC: 30.6 g/dL (ref 30.0–36.0)
MCV: 93.1 fL (ref 80.0–100.0)
Monocytes Absolute: 0.7 10*3/uL (ref 0.1–1.0)
Monocytes Relative: 19 %
Neutro Abs: 2.6 10*3/uL (ref 1.7–7.7)
Neutrophils Relative %: 69 %
Platelets: 84 10*3/uL — ABNORMAL LOW (ref 150–400)
RBC: 3.79 MIL/uL — ABNORMAL LOW (ref 4.22–5.81)
RDW: 14.6 % (ref 11.5–15.5)
WBC: 3.6 10*3/uL — ABNORMAL LOW (ref 4.0–10.5)
nRBC: 0 % (ref 0.0–0.2)

## 2024-02-14 LAB — COMPREHENSIVE METABOLIC PANEL WITH GFR
ALT: 60 U/L — ABNORMAL HIGH (ref 0–44)
AST: 121 U/L — ABNORMAL HIGH (ref 15–41)
Albumin: 2.7 g/dL — ABNORMAL LOW (ref 3.5–5.0)
Alkaline Phosphatase: 361 U/L — ABNORMAL HIGH (ref 38–126)
Anion gap: 13 (ref 5–15)
BUN: 38 mg/dL — ABNORMAL HIGH (ref 8–23)
CO2: 25 mmol/L (ref 22–32)
Calcium: 9.4 mg/dL (ref 8.9–10.3)
Chloride: 103 mmol/L (ref 98–111)
Creatinine, Ser: 1.89 mg/dL — ABNORMAL HIGH (ref 0.61–1.24)
GFR, Estimated: 36 mL/min — ABNORMAL LOW (ref >60)
Glucose, Bld: 181 mg/dL — ABNORMAL HIGH (ref 70–99)
Potassium: 4.2 mmol/L (ref 3.5–5.1)
Sodium: 141 mmol/L (ref 135–145)
Total Bilirubin: 1.2 mg/dL (ref 0.0–1.2)
Total Protein: 6 g/dL — ABNORMAL LOW (ref 6.5–8.1)

## 2024-02-14 LAB — CBC
HCT: 49.2 % (ref 39.0–52.0)
Hemoglobin: 15 g/dL (ref 13.0–17.0)
MCH: 28.2 pg (ref 26.0–34.0)
MCHC: 30.5 g/dL (ref 30.0–36.0)
MCV: 92.7 fL (ref 80.0–100.0)
Platelets: 118 10*3/uL — ABNORMAL LOW (ref 150–400)
RBC: 5.31 MIL/uL (ref 4.22–5.81)
RDW: 14.3 % (ref 11.5–15.5)
WBC: 17.6 10*3/uL — ABNORMAL HIGH (ref 4.0–10.5)
nRBC: 0 % (ref 0.0–0.2)

## 2024-02-14 LAB — BLOOD GAS, ARTERIAL
Acid-Base Excess: 0.6 mmol/L (ref 0.0–2.0)
Acid-Base Excess: 1.3 mmol/L (ref 0.0–2.0)
Bicarbonate: 28.2 mmol/L — ABNORMAL HIGH (ref 20.0–28.0)
Bicarbonate: 28.9 mmol/L — ABNORMAL HIGH (ref 20.0–28.0)
Drawn by: 331471
FIO2: 100 %
FIO2: 100 %
MECHVT: 550 mL
MECHVT: 550 mL
O2 Content: 96 L/min
O2 Saturation: 97 %
O2 Saturation: 98.6 %
PEEP: 8 cmH2O
PEEP: 8 cmH2O
Patient temperature: 37
Patient temperature: 37.9
RATE: 18 {breaths}/min
RATE: 18 {breaths}/min
pCO2 arterial: 56 mmHg — ABNORMAL HIGH (ref 32–48)
pCO2 arterial: 58 mmHg — ABNORMAL HIGH (ref 32–48)
pH, Arterial: 7.31 — ABNORMAL LOW (ref 7.35–7.45)
pH, Arterial: 7.31 — ABNORMAL LOW (ref 7.35–7.45)
pO2, Arterial: 86 mmHg (ref 83–108)
pO2, Arterial: 103 mmHg (ref 83–108)

## 2024-02-14 LAB — PROTIME-INR
INR: 1.9 — ABNORMAL HIGH (ref 0.8–1.2)
Prothrombin Time: 21.9 s — ABNORMAL HIGH (ref 11.4–15.2)

## 2024-02-14 LAB — BASIC METABOLIC PANEL WITH GFR
Anion gap: 4 — ABNORMAL LOW (ref 5–15)
BUN: 25 mg/dL — ABNORMAL HIGH (ref 8–23)
CO2: 31 mmol/L (ref 22–32)
Calcium: 9 mg/dL (ref 8.9–10.3)
Chloride: 101 mmol/L (ref 98–111)
Creatinine, Ser: 1.33 mg/dL — ABNORMAL HIGH (ref 0.61–1.24)
GFR, Estimated: 55 mL/min — ABNORMAL LOW (ref >60)
Glucose, Bld: 101 mg/dL — ABNORMAL HIGH (ref 70–99)
Potassium: 3.7 mmol/L (ref 3.5–5.1)
Sodium: 136 mmol/L (ref 135–145)

## 2024-02-14 LAB — GLUCOSE, CAPILLARY
Glucose-Capillary: 179 mg/dL — ABNORMAL HIGH (ref 70–99)
Glucose-Capillary: 138 mg/dL — ABNORMAL HIGH (ref 70–99)

## 2024-02-14 LAB — LACTIC ACID, PLASMA: Lactic Acid, Venous: 3.7 mmol/L (ref 0.5–1.9)

## 2024-02-14 LAB — ECHOCARDIOGRAM COMPLETE
Area-P 1/2: 4.49 cm2
Est EF: 35
Height: 68 in
MV M vel: 5.32 m/s
MV Peak grad: 113.2 mmHg
P 1/2 time: 362 ms
Radius: 0.4 cm
S' Lateral: 4.2 cm
Weight: 2564.39 [oz_av]

## 2024-02-14 LAB — BRAIN NATRIURETIC PEPTIDE: B Natriuretic Peptide: 597.5 pg/mL — ABNORMAL HIGH (ref 0.0–100.0)

## 2024-02-14 LAB — PHOSPHORUS: Phosphorus: 4 mg/dL (ref 2.5–4.6)

## 2024-02-14 LAB — TROPONIN I (HIGH SENSITIVITY): Troponin I (High Sensitivity): 580 ng/L (ref <18)

## 2024-02-14 LAB — MAGNESIUM: Magnesium: 1.8 mg/dL (ref 1.7–2.4)

## 2024-02-14 MED ORDER — ORAL CARE MOUTH RINSE: 15.0000 mL | OROMUCOSAL | Status: DC | PRN

## 2024-02-14 MED ORDER — NOREPINEPHRINE 4 MG/250ML-% IV SOLN
0.0000 ug/min | INTRAVENOUS | Status: DC
  Administered 2024-02-14: 40 ug/min via INTRAVENOUS
  Administered 2024-02-14: 2 ug/min via INTRAVENOUS
  Administered 2024-02-14: 32 ug/min via INTRAVENOUS
  Filled 2024-02-14 (×2): qty 250

## 2024-02-14 MED ORDER — METHYLPREDNISOLONE SODIUM SUCC 125 MG IJ SOLR
INTRAMUSCULAR | Status: AC
  Administered 2024-02-14: 125 mg
  Filled 2024-02-14: qty 2

## 2024-02-14 MED ORDER — METHYLPREDNISOLONE SODIUM SUCC 40 MG IJ SOLR
40.0000 mg | Freq: Two times a day (BID) | INTRAMUSCULAR | Status: DC
  Administered 2024-02-15: 40 mg via INTRAVENOUS
  Filled 2024-02-14: qty 1

## 2024-02-14 MED ORDER — FENTANYL CITRATE PF 50 MCG/ML IJ SOSY
PREFILLED_SYRINGE | INTRAMUSCULAR | Status: AC
Start: 2024-02-14 — End: 2024-02-14
  Filled 2024-02-14: qty 1

## 2024-02-14 MED ORDER — FENTANYL CITRATE PF 50 MCG/ML IJ SOSY: 25.0000 ug | PREFILLED_SYRINGE | INTRAMUSCULAR | Status: DC | PRN

## 2024-02-14 MED ORDER — MIDAZOLAM HCL 2 MG/2ML IJ SOLN
INTRAMUSCULAR | Status: AC
  Filled 2024-02-14: qty 2

## 2024-02-14 MED ORDER — PROPOFOL 1000 MG/100ML IV EMUL
0.0000 ug/kg/min | INTRAVENOUS | Status: DC
  Administered 2024-02-14: 25 ug/kg/min via INTRAVENOUS
  Administered 2024-02-14 – 2024-02-15 (×2): 5 ug/kg/min via INTRAVENOUS
  Filled 2024-02-14 (×3): qty 100

## 2024-02-14 MED ORDER — ORAL CARE MOUTH RINSE
15.0000 mL | OROMUCOSAL | Status: DC
  Administered 2024-02-14 – 2024-02-15 (×9): 15 mL via OROMUCOSAL

## 2024-02-14 MED ORDER — DOCUSATE SODIUM 50 MG/5ML PO LIQD
100.0000 mg | Freq: Two times a day (BID) | ORAL | Status: DC
  Administered 2024-02-15: 100 mg
  Filled 2024-02-14: qty 10

## 2024-02-14 MED ORDER — EPINEPHRINE HCL 5 MG/250ML IV SOLN IN NS
0.5000 ug/min | INTRAVENOUS | Status: DC
  Administered 2024-02-14: 0.5 ug/min via INTRAVENOUS
  Administered 2024-02-15 (×2): 20 ug/min via INTRAVENOUS
  Filled 2024-02-14 (×2): qty 250

## 2024-02-14 MED ORDER — APIXABAN 5 MG PO TABS
5.0000 mg | ORAL_TABLET | Freq: Two times a day (BID) | ORAL | Status: DC
  Administered 2024-02-15: 5 mg
  Filled 2024-02-14: qty 1

## 2024-02-14 MED ORDER — LIDOCAINE HCL 1 % IJ SOLN
INTRAMUSCULAR | Status: AC
  Filled 2024-02-14: qty 20

## 2024-02-14 MED ORDER — FAMOTIDINE 20 MG PO TABS: 20.0000 mg | ORAL_TABLET | Freq: Two times a day (BID) | ORAL | Status: DC

## 2024-02-14 MED ORDER — MAGNESIUM SULFATE 2 GM/50ML IV SOLN
2.0000 g | Freq: Once | INTRAVENOUS | Status: AC
  Administered 2024-02-14: 2 g via INTRAVENOUS
  Filled 2024-02-14: qty 50

## 2024-02-14 MED ORDER — POLYETHYLENE GLYCOL 3350 17 G PO PACK
17.0000 g | PACK | Freq: Every day | ORAL | Status: DC
  Administered 2024-02-15: 17 g
  Filled 2024-02-14: qty 1

## 2024-02-14 MED ORDER — EPINEPHRINE HCL 5 MG/250ML IV SOLN IN NS
INTRAVENOUS | Status: AC
  Filled 2024-02-14: qty 250

## 2024-02-14 MED ORDER — CHLORHEXIDINE GLUCONATE CLOTH 2 % EX PADS
6.0000 | MEDICATED_PAD | Freq: Every day | CUTANEOUS | Status: DC
  Administered 2024-02-14: 6 via TOPICAL

## 2024-02-14 MED ORDER — SODIUM CHLORIDE 0.9 % IV SOLN: INTRAVENOUS | Status: DC | PRN

## 2024-02-14 MED ORDER — IPRATROPIUM-ALBUTEROL 0.5-2.5 (3) MG/3ML IN SOLN: 3.0000 mL | Freq: Four times a day (QID) | RESPIRATORY_TRACT | Status: DC | PRN

## 2024-02-14 MED ORDER — NOREPINEPHRINE 16 MG/250ML-% IV SOLN
0.0000 ug/min | INTRAVENOUS | Status: DC
  Administered 2024-02-14: 2 ug/min via INTRAVENOUS
  Administered 2024-02-15: 40 ug/min via INTRAVENOUS
  Filled 2024-02-14 (×2): qty 250

## 2024-02-14 MED ORDER — SODIUM CHLORIDE 0.9 % IV SOLN
3.0000 g | Freq: Four times a day (QID) | INTRAVENOUS | Status: DC
  Administered 2024-02-14 – 2024-02-15 (×3): 3 g via INTRAVENOUS
  Filled 2024-02-14 (×3): qty 8

## 2024-02-14 MED ORDER — MIDAZOLAM HCL 2 MG/2ML IJ SOLN
1.0000 mg | INTRAMUSCULAR | Status: DC | PRN
  Administered 2024-02-14 (×2): 2 mg via INTRAVENOUS
  Filled 2024-02-14: qty 2

## 2024-02-14 MED ORDER — VASOPRESSIN 20 UNITS/100 ML INFUSION FOR SHOCK
0.0000 [IU]/min | INTRAVENOUS | Status: DC
  Administered 2024-02-15: 0.03 [IU]/min via INTRAVENOUS
  Filled 2024-02-14 (×2): qty 100

## 2024-02-14 MED ORDER — VASOPRESSIN 20 UNITS/100 ML INFUSION FOR SHOCK
INTRAVENOUS | Status: AC
  Administered 2024-02-14: 0.03 [IU]/min via INTRAVENOUS
  Filled 2024-02-14: qty 100

## 2024-02-14 MED ORDER — PANTOPRAZOLE SODIUM 40 MG IV SOLR
40.0000 mg | Freq: Every day | INTRAVENOUS | Status: DC
  Administered 2024-02-14 – 2024-02-15 (×2): 40 mg via INTRAVENOUS
  Filled 2024-02-14 (×2): qty 10

## 2024-02-14 MED ORDER — FENTANYL CITRATE PF 50 MCG/ML IJ SOSY
25.0000 ug | PREFILLED_SYRINGE | INTRAMUSCULAR | Status: DC | PRN
  Administered 2024-02-14: 50 ug via INTRAVENOUS

## 2024-02-14 MED ORDER — TRANEXAMIC ACID FOR INHALATION
500.0000 mg | Freq: Once | RESPIRATORY_TRACT | Status: AC
  Administered 2024-02-14: 500 mg via RESPIRATORY_TRACT
  Filled 2024-02-14: qty 10

## 2024-02-14 MED FILL — Medication: Qty: 1 | Status: AC

## 2024-02-14 NOTE — IPAL (Signed)
  Interdisciplinary Goals of Care Family Meeting   Date carried out: 02/14/2024  Location of the meeting: Bedside  Member's involved: Nurse Practitioner, Bedside Registered Nurse, Chaplain, and Family Member or next of kin  Durable Power of Attorney or acting medical decision maker: Wife    Discussion: We discussed goals of care for [name] .  Discussed his critical illness. Initially Rns and chaplain present, later [name] present.  She is beginning to understand he is critically ill and there is a chance he may not survive. We are to do all aggressive interventions to try to help him through this and he remains full code.   She is hoping her sons will come to town soon   Code status:   Code Status: Full Code   Disposition: Continue current acute care  Time spent for the meeting: 27    Timothy Fetter, NP  02/14/2024, 6:11 PM

## 2024-02-14 NOTE — Progress Notes (Addendum)
 Rapid response initiated at 1500  Prior breathing changes (worsening tachypnea & rhonchi/wheezing) still noted & MD Dahal had just left pt bedside. However, now pt is unable to respond in full sentences & increased work of breathing noted. This RN remained at pt bedside & rapid response initiated.  O2 SAT remained 98-100%  on 1L -increased to 3L fo support.  Upon arrival of rapid response RN's & charge RN pt coded & code blue initiated-see MD Dahal note/code documentation.

## 2024-02-14 NOTE — Progress Notes (Addendum)
 Paged MD Dahal @1442  & he promptly responded to pt bedside: He is having sudden respiratory changes- tachypnea & worsening rhonchi + wheezing. This is a change from his presentation this am/early afternoon. Echo and thoracentesis completed today. Patient responsive A/Ox4    02/14/24 1456  Vitals  Temp 98.8 F (37.1 C)  BP (!) 148/107  MAP (mmHg) 113  BP Location Left Arm  BP Method Automatic  Patient Position (if appropriate) Lying  ECG Heart Rate 80  Resp (!) 35  Level of Consciousness  Level of Consciousness Alert  MEWS COLOR  MEWS Score Color Yellow  Oxygen Therapy  SpO2 100 %  O2 Device Nasal Cannula  O2 Flow Rate (L/min) 3 L/min  MEWS Score  MEWS Temp 0  MEWS Systolic 0  MEWS Pulse 0  MEWS RR 2  MEWS LOC 0  MEWS Score 2

## 2024-02-14 NOTE — ED Provider Notes (Signed)
  [facility]-ICU/STEPDOWN Provider Note   Code Blue note - Late Entry.  Called to floor Code Blue.   Patient Full Code per RN staff.  Intubated by myself.  Significant hemoptysis/bloody airway. Additional suction / Glidescope required - 2 attempts required.   ROSC obtained after approx 10 minutes of CPR.   Hospitalist service / ICU team present in the room.    Procedures Date/Time: 02/14/2024 4:46 PM  Performed by: Burnette Carte, MDPre-anesthesia Checklist: Patient identified Oxygen Delivery Method: Ambu bag Laryngoscope Size: Glidescope and 4 Grade View: Grade I Tube size: 7.5 mm Number of attempts: 2 Placement Confirmation: ETT inserted through vocal cords under direct vision and Positive ETCO2 Secured at: 24 cm Tube secured with: ETT holder Comments: Significant hemoptysis / bloody airway        Burnette Carte, MD 02/14/24 1652

## 2024-02-14 NOTE — Progress Notes (Signed)
 Chaplain provided compassionate presence to pt's wife Stephenie Einstein. Chaplain also coordinated with Doroteo Gasmen for Hiram Lukes to visit at Debbie's urgent request. Hiram Lukes has since arrived, and chaplains remain available.

## 2024-02-14 NOTE — Progress Notes (Incomplete)
 YELLOW MEWS.  02/14/24 1456  Vitals  Temp 98.8 F (37.1 C)  BP (!) 148/107  MAP (mmHg) 113  BP Location Left Arm  BP Method Automatic  Patient Position (if appropriate) Lying  ECG Heart Rate 80  Resp (!) 35  Level of Consciousness  Level of Consciousness Alert  Oxygen Therapy  SpO2 100 %  O2 Device Nasal Cannula  O2 Flow Rate (L/min) 3 L/min

## 2024-02-14 NOTE — Progress Notes (Signed)
 PROGRESS NOTE  GEREMIAH Henry  DOB: 1946/02/17  PCP: Jearldine Mina, MD OZH:[id number]  DOA: 02/12/2024  LOS: 1 day  Hospital Day: 3  Brief narrative: [name] is a 78 y.o. male with PMH significant for multiple sclerosis, HTN, HLD, slow A-fib s/p PPM, on Eliquis , mild systolic CHF, severe TR, moderate pulm artery hypertension, aortic dissection s/p emergency repair 2000, recurrent right pleural effusion, CKD  5/31, patient presented to the ED with complaint of dyspnea, productive cough of whitish sputum, orthopnea, progressive bilateral edema for 1 week.  Reports noncompliance to diuretics.  Most recent echo from 2024 with LV global hypokinesis, EF 30 to 35%.  In the ED, patient afebrile, blood pressure elevated to 160/82, breathing on room air Labs with potassium 4.7, hemoglobin 12.4, platelet 94, BNP elevated over 1000, troponin negative, BN/creatinine 20/1.37 Chest x-ray showed new moderate to large right pleural effusion and right basilar atelectasis. Patient was given 1 dose of IV Lasix  40 mg Admitted to TRH  Subjective: Patient was seen and examined this morning.  Propped up in bed.  Not in distress.  On low-flow oxygen. Pending thoracentesis today, pending echocardiogram Labs from this morning with creatinine slightly up at 1.33  Assessment and plan: Acute exacerbation of chronic systolic CHF Large right pleural effusion Presented with 1 week of dyspnea, pedal edema in the setting of known systolic CHF and noncompliance to diuretics Initial labs with BNP elevated over 1000 Started on IV Lasix  40 mg twice daily. Pending repeat echocardiogram Continue Toprol  50 mg daily as before.  Continue to hold amlodipine/valsartan Fluid restriction of 1200 mL/day to follow Ultrasound-guided thoracentesis pending. Continue to monitor for daily intake output, weight, blood pressure, BNP, renal function and electrolytes. Net IO Since Admission: -3,360 mL [02/14/24  1345] Recent Labs  Lab 02/12/24 1128 02/12/24 1415 02/13/24 0518 02/14/24 0427  BNP 1,030.7*  --   --   --   BUN 20  --  21 25*  CREATININE 1.37*  --  1.28* 1.33*  NA 138  --  138 136  K 4.2  --  4.3 3.7  MG  --  2.1  --  1.8   Acute respiratory failure with hypoxia Not on supplemental oxygen at home.  Currently requiring 1 to 2 L. Has significant cough on trying to take a deep breath.  Start Mucinex, incentive spirometry  Paroxysmal A-fib On Toprol  50 mg daily.  Patient has a permanent pacemaker in place Continue chronic anticoagulation with Eliquis   Mild chronic anemia Hemoglobin stable Recent Labs    02/12/24 1128 02/13/24 0518 02/14/24 0427  HGB 12.4* 11.8* 10.8*  MCV 93.1 94.5 93.1   Thrombocytopenia Platelet low but stable.  No active bleeding.  Okay to continue Eliquis  for now Recent Labs  Lab 02/12/24 1128 02/13/24 0518 02/14/24 0427  PLT 94* 88* 84*    Multiple sclerosis Continue with dalfamprimidine, baclofen . Also seems to her Copaxone  MWF injection at home.  Insomnia Continue as before - Seroquel  25 to 50 mg and melatonin 5 to 10 mg nightly.  Constipation Scheduled and as needed bowel regimen ordered   Mobility: PT eval pending  Goals of care   Code Status: Full Code    DVT prophylaxis:   apixaban  (ELIQUIS ) tablet 5 mg   Antimicrobials: None Fluid: None Consultants: None Family Communication: None at bedside  Status: Observation Level of care:  Telemetry   Patient is from: Home Needs to continue in-hospital care: Needs to continue diuresis Anticipated d/c to: Hopefully  home in 2 to 3 days      Diet:  Diet Order             Diet Heart Room service appropriate? Yes; Fluid consistency: Thin; Fluid restriction: 1200 mL Fluid  Diet effective now                   Scheduled Meds:  apixaban   5 mg Oral BID   baclofen   5 mg Oral QHS   dalfampridine   10 mg Oral BID   folic acid   1 mg Oral Daily   furosemide   40 mg  Intravenous BID   guaiFENesin  600 mg Oral BID   leptospermum manuka honey  1 Application Topical Daily   melatonin  5 mg Oral QHS   metoprolol  succinate  50 mg Oral Daily   QUEtiapine   25-50 mg Oral QHS   senna-docusate  1 tablet Oral QHS    PRN meds: acetaminophen  **OR** acetaminophen , melatonin, ondansetron  **OR** ondansetron  (ZOFRAN ) IV, polyethylene glycol   Infusions:    Antimicrobials: Anti-infectives (From admission, onward)    None       Objective: Vitals:   02/14/24 1052 02/14/24 1313  BP: (!) 123/57 (!) 145/65  Pulse:  86  Resp:  (!) 26  Temp:  98.3 F (36.8 C)  SpO2:  100%    Intake/Output Summary (Last 24 hours) at 02/14/2024 1345 Last data filed at 02/14/2024 0950 Gross per 24 hour  Intake 480 ml  Output 2200 ml  Net -1720 ml   Filed Weights   02/12/24 1547 02/13/24 0500 02/14/24 0431  Weight: 71.3 kg 73 kg 72.7 kg   Weight change: -4.4 kg Body mass index is 24.37 kg/m.   Physical Exam: General exam: Pleasant, elderly Caucasian male.  Skin: No rashes, lesions or ulcers. HEENT: Atraumatic, normocephalic, no obvious bleeding Lungs: Clear to auscultation bilaterally, cough improving CVS: S1, S2, no murmur,   GI/Abd: Soft, nontender, nondistended, bowel sound present,   CNS: Alert, awake, oriented x 3 Psychiatry: Mood appropriate,  Extremities: Improving bilateral pedal edema, no calf tenderness,   Data Review: I have personally reviewed the laboratory data and studies available.  F/u labs  Unresulted Labs (From admission, onward)     Start     Ordered   03/08/2024 0500  CBC with Differential/Platelet  Tomorrow morning,   R       Question:  Specimen collection method  Answer:  Lab=Lab collect   02/14/24 1345   02/18/2024 0500  Basic metabolic panel with GFR  Tomorrow morning,   R       Question:  Specimen collection method  Answer:  Lab=Lab collect   02/14/24 1345            Admission date and time: 02/12/2024 10:59 AM     Signed, Hoyt Macleod, MD [facility] 02/14/2024

## 2024-02-14 NOTE — Progress Notes (Addendum)
 eLink Physician-Brief Progress Note Patient Name: SARTHAK RUBENSTEIN DOB: Nov 20, 1945 MRN: [id number]   Date of Service  02/14/2024  HPI/Events of Note  CHF exacerbation. Echo no change EF 30-35%, on eliquis  for A fib, status post cardiac arrest for 10 minutes Notified about its lactic acid 3.7, troponin elevated 580  eICU Interventions  Trend labs Place enteral tube   2300 -patient is on maximal norepinephrine, vasopressin, and rapidly escalating doses of epinephrine.  We will obtain an ABG with i-STAT.  I called the patient's son Myrtie Atkinson who is coming up from [place]-advised him that we are nearing maximal support with vasopressors and I fear that Maziah may pass imminently.  I recommended a transition to DO NOT RESUSCITATE if you were to code with maximal pressor and ventilator support.  They would like some time to talk with the remaining family and will call him back to readdress this shortly.  2324 -spoke with the patient's son Myrtie Atkinson wishes to pursue his mother's wishes which included carcinoma full code and maintaining current level of care.  If the patient wants to code, continue full code.  ABG reviewed, no immediate interventions.  0410 -we are essentially at maximal support for epinephrine, norepinephrine, and vasopressin.  There is no evidence of hypoglycemia, acidemia, electrolyte disturbances, or other reversible etiologies for cardiac arrest identified at this further.  I updated the patient's other son Bearl Limes who came to the room earlier this morning we are anticipating impending cardiac arrest.  No change to goals of care at this time.  Continue current care  Intervention Category Minor Interventions: Routine modifications to care plan (e.g. PRN medications for pain, fever)  Addilyn Satterwhite 02/14/2024, 9:01 PM

## 2024-02-14 NOTE — Procedures (Signed)
 Arterial Catheter Insertion Procedure Note  VERDIS KOVAL  [id number]  17-Jun-1946  Date:02/14/24  Time:6:27 PM    Provider Performing: Margaretann Sharper    Procedure: Insertion of Arterial Line (78295) with US  guidance (62130)   Indication(s) Blood pressure monitoring and/or need for frequent ABGs  Consent Verbal by spouse  Anesthesia None   Time Out Verified patient identification, verified procedure, site/side was marked, verified correct patient position, special equipment/implants available, medications/allergies/relevant history reviewed, required imaging and test results available.   Sterile Technique Maximal sterile technique including full sterile barrier drape, hand hygiene, sterile gown, sterile gloves, mask, hair covering, sterile ultrasound probe cover (if used).   Procedure Description Area of catheter insertion was cleaned with chlorhexidine  and draped in sterile fashion. With real-time ultrasound guidance an arterial catheter was placed into the right radial artery.  Appropriate arterial tracings confirmed on monitor.     Complications/Tolerance None; patient tolerated the procedure well.   EBL Minimal   Specimen(s) None

## 2024-02-14 NOTE — Progress Notes (Signed)
 PT Cancellation Note  Patient Details Name: [name] MRN: [id number] DOB: 05/17/1946   Cancelled Treatment:    Reason Eval/Treat Not Completed: Other (comment). Visitors present in am. Testing/imaging in pm. PT to continue to follow acutely and return as schedule allows.   Cary Clarks, PT Acute Rehab   Annalee Kiang 02/14/2024, 2:07 PM

## 2024-02-14 NOTE — Significant Event (Signed)
 Called at bedside by RN for rapid breathing. I attended immediately.  Of note, admitted for CHF exacerbation. Echo no change EF 30-35%, on eliquis  for A fib Received thoracentesis earlier this am.  At the time of my evaluation, patient was tachypneic on 2 lpm, O2 sat >95%. Alert, awake, able to answer orientation questions like in am..  Was trying to self suction the secretions. No chest pain or pressure.  I heard coarse breath sound heard bilaterally,  I ordered duoneb, chest PT. Increase the flow of O2. I checked the chart. This morning, after thoracentesis, xray was unremarkable for pneumothorax.  Within minutes of this event, RN noted patient more lethargic. Rapid response was called. I was called at bedside again.  Patient coded soon after. CPR was started.  ACLS protocol followed. Received 4 doses of epi.  In 10 -12 minutes, ROSC was achieved.  ED Dr. Reba Camper came during the code and intubated. Lots of bloody frothy secretion suctioned out.  PCCM Dr. Gaynell Keeler came to bedside. Signed out to him. Patient was tranferred to ICU.  I called and updated patient's wife on the phone. I had to repeat myself multiple times to make her understand the situation. At the end of the call, I wasn't sure if she understood the gravity of his critical condition. I called patient's son Bearl Limes and explained to him too. He stated that patient's wife has cognitive impairment and maynot have grasped it all. We'll update the family again.

## 2024-02-14 NOTE — Procedures (Signed)
 PROCEDURE SUMMARY:  Successful US  guided right thoracentesis. Yielded 1.5 L of clear, yellow fluid. Patient tolerated procedure well. No immediate complications. EBL = trace  Post procedure chest X-ray pending.    Pasty Bongo PA-C 02/14/2024 11:05 AM

## 2024-02-14 NOTE — Progress Notes (Signed)
 PT intubated on 4E by ED MD- no glide blade charge by RT.

## 2024-02-14 NOTE — Consult Note (Signed)
 NAME:  [name], MRN:  [id number], DOB:  [birth year], LOS: 1 ADMISSION DATE:  02/12/2024, CONSULTATION DATE:  02/14/24 REFERRING MD:  dahal, CHIEF COMPLAINT:  in hospital cardiac arrest    History of Present Illness:  78 yo M PMH HFrEF, MS, HTN, Afib s/p PPM on eliquis , severe TR, PAH, recurrent R effusion, prior aortic dissection s/p repair in 2000, CKD was admitted to [facility] 5/31 after presenting to ED w SOB -- found to have moderate/large R pleural effusion.  He was planned for IR thora 6/1, pt unavailable so deferred to 6/2   Thora uneventful, 1.5L clear yellow fluid and post thora imaging w/o ptx. Hours later more lethargic, SOB, given duoneb for wheeze. Subsequently coded x 10-12 min. Epi x 4. Significant airway bleeding   PCCM is consulted after ROSC in this setting     Pertinent  Medical History  MS HFrEF CAD HTN HLD PAH Severe TR  Significant Hospital Events: Including procedures, antibiotic start and stop dates in addition to other pertinent events   5/31 admit for SOB, large pleural effusion 6/2 thora. Later in day SOB --> code x 12 min.   Interim History / Subjective:  Txf to ICU   Was not getting volumes at all -- max 100cc-- had to pop him off to bag him for a bit and push some sedation. Improved volumes to the 500s   Objective    Blood pressure (!) 148/107, pulse 86, temperature 98.8 F (37.1 C), resp. rate (!) 35, height 5\' 8"  (1.727 m), weight 72.7 kg, SpO2 100%.        Intake/Output Summary (Last 24 hours) at 02/14/2024 1537 Last data filed at 02/14/2024 1300 Gross per 24 hour  Intake 640 ml  Output 1700 ml  Net -1060 ml   Filed Weights   02/12/24 1547 02/13/24 0500 02/14/24 0431  Weight: 71.3 kg 73 kg 72.7 kg    Examination: General: critically and chronically ill elderly M intubated  HENT: ETT secure with bloody secretions  Lungs: Minimal air movement, mechanically ventilated  Cardiovascular: paced s1s2  Abdomen: soft ndnt  Extremities:  RLE IO  Neuro: Roving eyes with slight L preference. Does not follow commands. + spontaneous respirations  GU: external catheter   Resolved problem list   Assessment and Plan    In-hospital cardiac arrest Shock  Acute encephalopathy  -unclear etiology -- query bronchospasm from preceding SOB and minimal air movement after he arrived to unit -resusc 10-52min P -CT H  -- MRI in 72hr if no improvement. cEEG not available at this campus but I do not feel we have indication to txf for this at present.  -CBC BMP LA trop BNP  -supportive care -NE via IO  -resp failure as below   Acute respiratory failure with hypoxia Hemoptysis (? Compression trauma)  Possible aspiration event  Pleural effusion s/p thora  P -CXR ABG -VAP, pulm hygiene -wean as able -- right now is req sedation to prevent biting tube  -txa neb -2g mag  -solumedrol -nebs -unasyn for empiric coverage possible aspiration event  -follow pleural studies   HFrEF CAD Afib on eliquis , s/p ppm  Left bundle  P -cont eliquis  for now   Chronic anemia Thrombocytopenia  P -post code Cbc is pending   MS -dalfampridine  TB12   Code status discussion -Full code at present. Wife is our Management consultant though there has been some question of understanding, possible dementia   Best Practice (right click and "Reselect all  SmartList Selections" daily)   Diet/type: NPO DVT prophylaxis DOAC Pressure ulcer(s): pressure ulcer assessment deferred  GI prophylaxis: PPI Lines: IO Foley:  N/A Code Status:  full code Last date of multidisciplinary goals of care discussion [6/2]  Labs   CBC: Recent Labs  Lab 02/12/24 1128 02/13/24 0518 02/14/24 0427  WBC 4.7 4.5 3.6*  NEUTROABS  --   --  2.6  HGB 12.4* 11.8* 10.8*  HCT 40.3 39.6 35.3*  MCV 93.1 94.5 93.1  PLT 94* 88* 84*    Basic Metabolic Panel: Recent Labs  Lab 02/12/24 1128 02/12/24 1415 02/13/24 0518 02/14/24 0427  NA 138  --  138 136  K 4.2  --  4.3  3.7  CL 102  --  105 101  CO2 26  --  26 31  GLUCOSE 104*  --  104* 101*  BUN 20  --  21 25*  CREATININE 1.37*  --  1.28* 1.33*  CALCIUM  10.2  --  9.6 9.0  MG  --  2.1  --  1.8  PHOS  --  3.2  --  4.0   GFR: Estimated Creatinine Clearance: 45.7 mL/min (A) (by C-G formula based on SCr of 1.33 mg/dL (H)). Recent Labs  Lab 02/12/24 1128 02/13/24 0518 02/14/24 0427  WBC 4.7 4.5 3.6*    Liver Function Tests: Recent Labs  Lab 02/12/24 1128 02/13/24 0518  AST 39 29  ALT 31 26  ALKPHOS 274* 247*  BILITOT 1.2 1.1  PROT 7.5 6.8  ALBUMIN 3.7 3.5   No results for input(s): "LIPASE", "AMYLASE" in the last 168 hours. No results for input(s): "AMMONIA" in the last 168 hours.  ABG No results found for: "PHART", "PCO2ART", "PO2ART", "HCO3", "TCO2", "ACIDBASEDEF", "O2SAT"   Coagulation Profile: No results for input(s): "INR", "PROTIME" in the last 168 hours.  Cardiac Enzymes: No results for input(s): "CKTOTAL", "CKMB", "CKMBINDEX", "TROPONINI" in the last 168 hours.  HbA1C: No results found for: "HGBA1C"  CBG: Recent Labs  Lab 02/14/24 1511  GLUCAP 179*    Review of Systems:   Unable to obtain, intubated   Past Medical History:  He,  has a past medical history of Anemia, Aneurysm of aortic arch (HCC) (04/04/2012), Aortic dissection (HCC) (11/05/1998), Aortic dissection, thoracic (HCC), Aortic root aneurysm, Atrial fibrillation (HCC) (01/30/2013), Bilateral lower extremity edema, BPH (benign prostatic hyperplasia), CHF (congestive heart failure) (HCC) (07[birth year]18[birth year]47), Depression, Dyslipidemia, Exertional shortness of breath, Hemorrhoids, History of blood transfusion, Hypertension, Mitral regurgitation (02/20/2013), Multiple sclerosis (HCC), Neuromuscular disorder (HCC), Pleural effusion, right, large (01/30/2013), Severe tricuspid valve regurgitation (01/30/2013), and Thrombocytopenia (HCC).   Surgical History:   Past Surgical History:  Procedure Laterality Date   HEMORRHOID SURGERY   2009   Internal, external hemorrhoidectomy, general anesthesia,prone position. [Other]   Open reduction and internal fixation of right distal radius fracture using [employer] distal radius volar locking plate.  07/2005   Dr Lucienne Ryder   PACEMAKER IMPLANT N/A 09/28/2022   Procedure: PACEMAKER IMPLANT;  Surgeon: Tammie Fall, MD;  Location: [facility] INVASIVE CV LAB;  Service: Cardiovascular;  Laterality: N/A;   Repair of acute type A aortic dissection with resuspension of native aortic valve  10/15/1998   Dr Alva Jewels   TEE WITHOUT CARDIOVERSION N/A 02/17/2013   Procedure: TRANSESOPHAGEAL ECHOCARDIOGRAM (TEE);  Surgeon: Luana Rumple, MD;  Location: [facility] ENDOSCOPY;  Service: Cardiovascular;  Laterality: N/A;   TEMPORARY PACEMAKER N/A 09/26/2022   Procedure: TEMPORARY PACEMAKER;  Surgeon: Arnoldo Lapping, MD;  Location: [facility] INVASIVE CV LAB;  Service: Cardiovascular;  Laterality: N/A;     Social History:   reports that he has never smoked. He has never used smokeless tobacco. He reports current alcohol use. He reports that he does not use drugs.   Family History:  His family history includes Emphysema in his brother; Heart attack in his mother; Lung cancer in his brother; Thyroid cancer in his father.   Allergies Allergies  Allergen Reactions   Farxiga  [Dapagliflozin ] Other (See Comments)    B/P dropped too low and extreme vertigo     Home Medications  Prior to Admission medications   Medication Sig Start Date End Date Taking? Authorizing Provider  amLODipine-valsartan (EXFORGE) 5-320 MG tablet Take 1 tablet by mouth daily. 12/31/22  Yes [provider]  apixaban  (ELIQUIS ) 5 MG TABS tablet Take 1 tablet (5 mg total) by mouth 2 (two) times daily. NEEDS FOLLOW UP APPOINTMENT FOR MORE REFILLS 11/12/23  Yes Bensimhon, Rheta Celestine, MD  baclofen  (LIORESAL ) 10 MG tablet Take 0.5 tablets (5 mg total) by mouth at bedtime. 2nd prn 03/02/23  Yes Penumalli, Vikram R, MD  COPAXONE  40 MG/ML SOSY INJECT ONE  SYRINGE SUBCUTANEOUSLY 3 TIMES A WEEK AT LEAST 48 HOURS APART. ALLOW TO WARM TO ROOM TEMP FOR 20 MINUTES. REFRIGERATE. 04/07/23  Yes Penumalli, Brenton Cambridge, MD  dalfampridine  10 MG TB12 Take 1 tablet (10 mg total) by mouth 2 (two) times daily. Approximately 12 hours apart. May be taken with or without food. Do not crush and store at room temp. 03/02/23  Yes Penumalli, Vikram R, MD  folic acid  (FOLVITE ) 1 MG tablet Take 1 mg by mouth daily.   Yes [provider]  melatonin 5 MG TABS Take 5 mg by mouth at bedtime as needed (for sleep).   Yes [provider]  metoprolol  succinate (TOPROL  XL) 50 MG 24 hr tablet Take 50 mg by mouth daily. Take with or immediately following a meal.   Yes [provider]  Multiple Vitamins-Minerals (MULTIVITAMIN MEN) TABS Take 1 tablet by mouth daily.   Yes [provider]  QUEtiapine  (SEROQUEL ) 25 MG tablet Take 25-50 mg by mouth at bedtime.   Yes [provider]  acetaminophen  (TYLENOL ) 325 MG tablet Take 2 tablets (650 mg total) by mouth every 6 (six) hours as needed for mild pain (or Fever >/= 101). Patient not taking: Reported on 02/12/2024 10/02/22   Cala Castleman, MD     Critical care time: 59 min     CRITICAL CARE Performed by: Delories Fetter   Total critical care time: 59 minutes  Critical care time was exclusive of separately billable procedures and treating other patients. Critical care was necessary to treat or prevent imminent or life-threatening deterioration.  Critical care was time spent personally by me on the following activities: development of treatment plan with patient and/or surrogate as well as nursing, discussions with consultants, evaluation of patient's response to treatment, examination of patient, obtaining history from patient or surrogate, ordering and performing treatments and interventions, ordering and review of laboratory studies, ordering and review of radiographic studies, pulse oximetry  and re-evaluation of patient's condition.   Eston Hence MSN, AGACNP-BC [place] [facility] Amion for pager  02/14/2024, 4:51 PM

## 2024-02-14 NOTE — Plan of Care (Signed)
  Problem: Clinical Measurements: Goal: Ability to maintain clinical measurements within normal limits will improve Outcome: Progressing Goal: Diagnostic test results will improve Outcome: Progressing   Problem: Activity: Goal: Risk for activity intolerance will decrease Outcome: Progressing   Problem: Safety: Goal: Ability to remain free from injury will improve Outcome: Progressing   Problem: Skin Integrity: Goal: Risk for impaired skin integrity will decrease Outcome: Progressing

## 2024-02-15 DIAGNOSIS — I5023 Acute on chronic systolic (congestive) heart failure: Secondary | ICD-10-CM | POA: Diagnosis not present

## 2024-02-15 DIAGNOSIS — J9601 Acute respiratory failure with hypoxia: Secondary | ICD-10-CM | POA: Diagnosis not present

## 2024-02-15 DIAGNOSIS — G9340 Encephalopathy, unspecified: Secondary | ICD-10-CM | POA: Diagnosis not present

## 2024-02-15 DIAGNOSIS — I469 Cardiac arrest, cause unspecified: Secondary | ICD-10-CM | POA: Diagnosis not present

## 2024-02-15 LAB — CBC WITH DIFFERENTIAL/PLATELET
Abs Immature Granulocytes: 0.26 10*3/uL — ABNORMAL HIGH (ref 0.00–0.07)
Basophils Absolute: 0.1 10*3/uL (ref 0.0–0.1)
Basophils Relative: 0 %
Eosinophils Absolute: 0.1 10*3/uL (ref 0.0–0.5)
Eosinophils Relative: 1 %
HCT: 56 % — ABNORMAL HIGH (ref 39.0–52.0)
Hemoglobin: 16.5 g/dL (ref 13.0–17.0)
Immature Granulocytes: 1 %
Lymphocytes Relative: 2 %
Lymphs Abs: 0.6 10*3/uL — ABNORMAL LOW (ref 0.7–4.0)
MCH: 28 pg (ref 26.0–34.0)
MCHC: 29.5 g/dL — ABNORMAL LOW (ref 30.0–36.0)
MCV: 94.9 fL (ref 80.0–100.0)
Monocytes Absolute: 2.3 10*3/uL — ABNORMAL HIGH (ref 0.1–1.0)
Monocytes Relative: 8 %
Neutro Abs: 25.4 10*3/uL — ABNORMAL HIGH (ref 1.7–7.7)
Neutrophils Relative %: 88 %
Platelets: 118 10*3/uL — ABNORMAL LOW (ref 150–400)
RBC: 5.9 MIL/uL — ABNORMAL HIGH (ref 4.22–5.81)
RDW: 14.6 % (ref 11.5–15.5)
WBC Morphology: INCREASED
WBC: 28.8 10*3/uL — ABNORMAL HIGH (ref 4.0–10.5)
nRBC: 0 % (ref 0.0–0.2)

## 2024-02-15 LAB — URINALYSIS, ROUTINE W REFLEX MICROSCOPIC
Bacteria, UA: NONE SEEN
Bilirubin Urine: NEGATIVE
Glucose, UA: NEGATIVE mg/dL
Hgb urine dipstick: NEGATIVE
Ketones, ur: NEGATIVE mg/dL
Leukocytes,Ua: NEGATIVE
Nitrite: NEGATIVE
Protein, ur: 30 mg/dL — AB
Specific Gravity, Urine: 1.01 (ref 1.005–1.030)
pH: 5 (ref 5.0–8.0)

## 2024-02-15 LAB — GLUCOSE, CAPILLARY
Glucose-Capillary: 111 mg/dL — ABNORMAL HIGH (ref 70–99)
Glucose-Capillary: 148 mg/dL — ABNORMAL HIGH (ref 70–99)
Glucose-Capillary: 75 mg/dL (ref 70–99)

## 2024-02-15 LAB — BASIC METABOLIC PANEL WITH GFR
Anion gap: 21 — ABNORMAL HIGH (ref 5–15)
BUN: 43 mg/dL — ABNORMAL HIGH (ref 8–23)
CO2: 18 mmol/L — ABNORMAL LOW (ref 22–32)
Calcium: 8.9 mg/dL (ref 8.9–10.3)
Chloride: 101 mmol/L (ref 98–111)
Creatinine, Ser: 3.02 mg/dL — ABNORMAL HIGH (ref 0.61–1.24)
GFR, Estimated: 21 mL/min — ABNORMAL LOW (ref >60)
Glucose, Bld: 142 mg/dL — ABNORMAL HIGH (ref 70–99)
Potassium: 4.3 mmol/L (ref 3.5–5.1)
Sodium: 140 mmol/L (ref 135–145)

## 2024-02-15 LAB — MRSA NEXT GEN BY PCR, NASAL: MRSA by PCR Next Gen: NOT DETECTED

## 2024-02-15 LAB — TROPONIN I (HIGH SENSITIVITY): Troponin I (High Sensitivity): 1222 ng/L (ref <18)

## 2024-02-15 LAB — TRIGLYCERIDES: Triglycerides: 93 mg/dL (ref <150)

## 2024-02-15 LAB — LACTIC ACID, PLASMA: Lactic Acid, Venous: 4.8 mmol/L (ref 0.5–1.9)

## 2024-02-15 MED ORDER — ACETAMINOPHEN 160 MG/5ML PO SOLN
650.0000 mg | Freq: Four times a day (QID) | ORAL | Status: DC | PRN
  Filled 2024-02-15: qty 20.3

## 2024-02-15 MED ORDER — ONDANSETRON HCL 4 MG PO TABS: 4.0000 mg | ORAL_TABLET | Freq: Four times a day (QID) | ORAL | Status: DC | PRN

## 2024-02-15 MED ORDER — ONDANSETRON HCL 4 MG/2ML IJ SOLN: 4.0000 mg | Freq: Four times a day (QID) | INTRAMUSCULAR | Status: DC | PRN

## 2024-02-15 MED ORDER — MORPHINE 100MG IN NS 100ML (1MG/ML) PREMIX INFUSION
0.0000 mg/h | INTRAVENOUS | Status: DC
  Administered 2024-02-15: 5 mg/h via INTRAVENOUS
  Filled 2024-02-15: qty 100

## 2024-02-15 MED ORDER — GLYCOPYRROLATE 1 MG PO TABS: 1.0000 mg | ORAL_TABLET | ORAL | Status: DC | PRN

## 2024-02-15 MED ORDER — BACLOFEN 10 MG PO TABS
5.0000 mg | ORAL_TABLET | Freq: Every day | ORAL | Status: DC
  Administered 2024-02-15: 5 mg

## 2024-02-15 MED ORDER — DIPHENHYDRAMINE HCL 50 MG/ML IJ SOLN
25.0000 mg | INTRAMUSCULAR | Status: DC | PRN
Start: 2024-02-15 — End: 2024-02-15

## 2024-02-15 MED ORDER — POLYVINYL ALCOHOL 1.4 % OP SOLN: 1.0000 [drp] | Freq: Four times a day (QID) | OPHTHALMIC | Status: DC | PRN

## 2024-02-15 MED ORDER — DOCUSATE SODIUM 50 MG/5ML PO LIQD: 100.0000 mg | Freq: Every day | ORAL | Status: DC

## 2024-02-15 MED ORDER — ACETAMINOPHEN 325 MG PO TABS: 650.0000 mg | ORAL_TABLET | Freq: Four times a day (QID) | ORAL | Status: DC | PRN

## 2024-02-15 MED ORDER — SENNOSIDES 8.8 MG/5ML PO SYRP
5.0000 mL | ORAL_SOLUTION | Freq: Every day | ORAL | Status: DC
  Filled 2024-02-15: qty 5

## 2024-02-15 MED ORDER — ACETAMINOPHEN 650 MG RE SUPP: 650.0000 mg | Freq: Four times a day (QID) | RECTAL | Status: DC | PRN

## 2024-02-15 MED ORDER — MORPHINE BOLUS VIA INFUSION
5.0000 mg | INTRAVENOUS | Status: DC | PRN
  Administered 2024-02-15 (×3): 5 mg via INTRAVENOUS

## 2024-02-15 MED ORDER — GLYCOPYRROLATE 0.2 MG/ML IJ SOLN: 0.2000 mg | INTRAMUSCULAR | Status: DC | PRN

## 2024-02-15 MED ORDER — GLYCOPYRROLATE 0.2 MG/ML IJ SOLN
0.2000 mg | INTRAMUSCULAR | Status: DC | PRN
  Administered 2024-02-15: 0.2 mg via INTRAVENOUS
  Filled 2024-02-15: qty 1

## 2024-02-15 MED ORDER — FOLIC ACID 1 MG PO TABS
1.0000 mg | ORAL_TABLET | Freq: Every day | ORAL | Status: DC
  Administered 2024-02-15: 1 mg
  Filled 2024-02-15: qty 1

## 2024-02-15 MED ORDER — GUAIFENESIN 100 MG/5ML PO LIQD
15.0000 mL | Freq: Four times a day (QID) | ORAL | Status: DC
  Administered 2024-02-15 (×2): 15 mL
  Filled 2024-02-15 (×2): qty 20

## 2024-02-15 MED ORDER — MIDAZOLAM HCL 2 MG/2ML IJ SOLN
2.0000 mg | INTRAMUSCULAR | Status: DC | PRN
  Administered 2024-02-15: 2 mg via INTRAVENOUS
  Filled 2024-02-15: qty 2

## 2024-02-19 LAB — POCT I-STAT EG7
Acid-base deficit: 2 mmol/L (ref 0.0–2.0)
Bicarbonate: 25.6 mmol/L (ref 20.0–28.0)
Calcium, Ion: 1.27 mmol/L (ref 1.15–1.40)
HCT: 49 % (ref 39.0–52.0)
Hemoglobin: 16.7 g/dL (ref 13.0–17.0)
O2 Saturation: 96 %
Patient temperature: 100.3
Potassium: 3.9 mmol/L (ref 3.5–5.1)
Sodium: 139 mmol/L (ref 135–145)
TCO2: 27 mmol/L (ref 22–32)
pCO2, Ven: 57.2 mmHg (ref 44–60)
pH, Ven: 7.263 (ref 7.25–7.43)
pO2, Ven: 101 mmHg — ABNORMAL HIGH (ref 32–45)

## 2024-02-20 LAB — CULTURE, BLOOD (ROUTINE X 2)
Culture: NO GROWTH
Culture: NO GROWTH

## 2024-02-28 ENCOUNTER — Encounter: Admitting: Pulmonary Disease

## 2024-03-01 ENCOUNTER — Ambulatory Visit: Payer: Medicare Other | Admitting: Adult Health

## 2024-03-14 NOTE — Progress Notes (Signed)
 Heart Failure Navigator Progress Note  Assessed for Heart & Vascular TOC clinic readiness.  Patient does not meet criteria due to patient passed away on 02-17-2024 @ 1151. .   Navigator will sign off at this time.   Randie Bustle, BSN, Scientist, clinical (histocompatibility and immunogenetics) Only

## 2024-03-14 NOTE — Plan of Care (Signed)
  Problem: Clinical Measurements: Goal: Respiratory complications will improve Outcome: Not Progressing Goal: Cardiovascular complication will be avoided Outcome: Not Progressing   Problem: Nutrition: Goal: Adequate nutrition will be maintained Outcome: Not Progressing   Problem: Elimination: Goal: Will not experience complications related to bowel motility Outcome: Not Progressing Goal: Will not experience complications related to urinary retention Outcome: Not Progressing   Problem: Activity: Goal: Capacity to carry out activities will improve Outcome: Not Progressing

## 2024-03-14 NOTE — Progress Notes (Signed)
 Cardiac time of death 1151; Spouse, 2 sons and daughter in law at bedside at time of death. Comfort care orders in place at time of death. CCM MD provider notified. All patient belongings given to Spouse/Sons. Post mortem check list completed.

## 2024-03-14 NOTE — Progress Notes (Cosign Needed Addendum)
 NAME:  [name], MRN:  [id number], DOB:  August 27, 1946, LOS: 2 ADMISSION DATE:  02/12/2024, CONSULTATION DATE:  02/14/24 REFERRING MD:  dahal, CHIEF COMPLAINT:  in hospital cardiac arrest    History of Present Illness:  78 yo M PMH HFrEF, MS, HTN, Afib s/p PPM on eliquis , severe TR, PAH, recurrent R effusion, prior aortic dissection s/p repair in 2000, CKD was admitted to [facility] 5/31 after presenting to ED w SOB -- found to have moderate/large R pleural effusion.  He was planned for IR thora 6/1, pt unavailable so deferred to 6/2   Thora uneventful, 1.5L clear yellow fluid and post thora imaging w/o ptx. Hours later more lethargic, SOB, given duoneb for wheeze. Subsequently coded x 10-12 min. Epi x 4. Significant airway bleeding   PCCM is consulted after ROSC in this setting     Pertinent  Medical History  MS HFrEF CAD HTN HLD PAH Severe TR  Significant Hospital Events: Including procedures, antibiotic start and stop dates in addition to other pertinent events   5/31 admit for SOB, large pleural effusion 6/2 thora. Later in day SOB --> code x 12 min.   Interim History / Subjective:  Progressive shock  High vent settings   Objective    Blood pressure (!) 96/54, pulse 90, temperature (!) 101.3 F (38.5 C), resp. rate 20, height 5\' 8"  (1.727 m), weight 72.7 kg, SpO2 93%.    Vent Mode: PRVC FiO2 (%):  [100 %] 100 % Set Rate:  [18 bmp] 18 bmp Vt Set:  [540 mL-550 mL] 540 mL PEEP:  [8 cmH20] 8 cmH20 Plateau Pressure:  [20 cmH20-29 cmH20] 27 cmH20   Intake/Output Summary (Last 24 hours) at 03/06/2024 1044 Last data filed at 02/19/2024 0900 Gross per 24 hour  Intake 2376.31 ml  Output 45 ml  Net 2331.31 ml   Filed Weights   02/12/24 1547 02/13/24 0500 02/14/24 0431  Weight: 71.3 kg 73 kg 72.7 kg    Examination: General: Critically and chronically ill elderly M  HENT: NCAT ETT secure bloody secretions  Lungs: mechanically ventilated  Cardiovascular: paced. Cap refill  sluggish  Abdomen: soft ndnt  Extremities: RLE IO  Neuro: Does not follow commands  GU: external catheter   Resolved problem list   Assessment and Plan    Goals of care discussion Encounter for palliative care DNR  In-hospital cardiac arrest Shock Acute encephalopathy  Acute hypoxic respiratory failure Hemoptysis Pleural effusion s/p thora Possible re-expansion edema Possible aspiration event  bronchospasm  HFrEF CAD Afib on eliquis  LBBB PPM in situ AoC anemia Thrombocytopenia MS  Sacral wound POA -downtime 10-15 min -copious bloody frothy secretions -- ? Re-expansion edema + bronchospasm --> arrest -6/3 I had ordered add'l ID workup w worsening shock + leukocytosis (which in part could be reactive). No pleural fluid was sent, verified this w primary at the time, so we wouldn't be abel to investigate that  -subsequently spoke w family multiple times and plan of care has changed as they understand this is not survivable: P -DNR -transition to comfort care  -start morphine gtt -extubate when comfortable + family ready -dc pressors after extubation -stop all other non-comfort meds now, no further labs.  -expect in-hospital death, likely minutes to hours  -appreciate Rabbi support     Best Practice (right click and "Reselect all SmartList Selections" daily)   Diet/type: NPO DVT prophylaxis not indicated Pressure ulcer(s): pressure ulcer assessment deferred . Sacral injury reported GI prophylaxis: N/A Lines: IO Foley:  N/A Code Status:  full code Last date of multidisciplinary goals of care discussion [6/3]  Labs   CBC: Recent Labs  Lab 02/12/24 1128 02/13/24 0518 02/14/24 0427 02/14/24 1948 02/21/2024 0559  WBC 4.7 4.5 3.6* 17.6* 28.8*  NEUTROABS  --   --  2.6  --  25.4*  HGB 12.4* 11.8* 10.8* 15.0 16.5  HCT 40.3 39.6 35.3* 49.2 56.0*  MCV 93.1 94.5 93.1 92.7 94.9  PLT 94* 88* 84* 118* 118*    Basic Metabolic Panel: Recent Labs  Lab  02/12/24 1128 02/12/24 1415 02/13/24 0518 02/14/24 0427 02/14/24 1842 02/13/2024 0614  NA 138  --  138 136 141 140  K 4.2  --  4.3 3.7 4.2 4.3  CL 102  --  105 101 103 101  CO2 26  --  26 31 25  18*  GLUCOSE 104*  --  104* 101* 181* 142*  BUN 20  --  21 25* 38* 43*  CREATININE 1.37*  --  1.28* 1.33* 1.89* 3.02*  CALCIUM  10.2  --  9.6 9.0 9.4 8.9  MG  --  2.1  --  1.8  --   --   PHOS  --  3.2  --  4.0  --   --    GFR: Estimated Creatinine Clearance: 20.1 mL/min (A) (by C-G formula based on SCr of 3.02 mg/dL (H)). Recent Labs  Lab 02/13/24 0518 02/14/24 0427 02/14/24 1842 02/14/24 1948 03/10/2024 0006 02/29/2024 0559  WBC 4.5 3.6*  --  17.6*  --  28.8*  LATICACIDVEN  --   --  3.7*  --  4.8*  --     Liver Function Tests: Recent Labs  Lab 02/12/24 1128 02/13/24 0518 02/14/24 1842  AST 39 29 121*  ALT 31 26 60*  ALKPHOS 274* 247* 361*  BILITOT 1.2 1.1 1.2  PROT 7.5 6.8 6.0*  ALBUMIN 3.7 3.5 2.7*   No results for input(s): "LIPASE", "AMYLASE" in the last 168 hours. No results for input(s): "AMMONIA" in the last 168 hours.  ABG    Component Value Date/Time   PHART 7.31 (L) 02/14/2024 2257   PCO2ART 58 (H) 02/14/2024 2257   PO2ART 103 02/14/2024 2257   HCO3 28.9 (H) 02/14/2024 2257   O2SAT 98.6 02/14/2024 2257     Coagulation Profile: Recent Labs  Lab 02/14/24 1842  INR 1.9*    Cardiac Enzymes: No results for input(s): "CKTOTAL", "CKMB", "CKMBINDEX", "TROPONINI" in the last 168 hours.  HbA1C: No results found for: "HGBA1C"  CBG: Recent Labs  Lab 02/14/24 1511 02/14/24 1943 02/27/2024 0005 03/07/2024 0350 02/14/2024 0809  GLUCAP 179* 138* 148* 111* 75    CRITICAL CARE Performed by: Delories Fetter   Total critical care time: 65 minutes  Critical care time was exclusive of separately billable procedures and treating other patients. Critical care was necessary to treat or prevent imminent or life-threatening deterioration.  Critical care was time  spent personally by me on the following activities: development of treatment plan with patient and/or surrogate as well as nursing, discussions with consultants, evaluation of patient's response to treatment, examination of patient, obtaining history from patient or surrogate, ordering and performing treatments and interventions, ordering and review of laboratory studies, ordering and review of radiographic studies, pulse oximetry and re-evaluation of patient's condition.  Eston Hence MSN, AGACNP-BC [facility] [facility] Amion for pager  03/08/2024, 10:44 AM

## 2024-03-14 NOTE — Progress Notes (Addendum)
 PT Cancellation Note  Patient Details Name: [name] MRN: [id number] DOB: Nov 10, 1945   Cancelled Treatment:    Reason Eval/Treat Not Completed: Medical issues which prohibited therapy. Will sign off.     Tanda Falter, PT Acute Rehabilitation  Office: [phone]

## 2024-03-14 NOTE — Progress Notes (Signed)
 Arterial line at this time has good waveform, draws and flushes blood, insertion site WNL (placed by CCM).

## 2024-03-14 NOTE — Death Summary Note (Signed)
 DEATH SUMMARY   Patient Details  Name: [name] MRN: [id number] DOB: January 10, 1946  Admission/Discharge Information   Admit Date:  2024-03-11  Date of Death: Date of Death: 03/14/2024  Time of Death: Time of Death: 03/21/1150  Length of Stay: 2  Referring Physician: Jearldine Mina, MD   Reason(s) for Hospitalization   Shortness of breath, pleural effusion  Diagnoses  Preliminary cause of death: decompensated systolic heart failure  Secondary Diagnoses (including complications and co-morbidities):  Principal Problem:   Acute on chronic systolic heart failure [facility]) Active Problems:   Thrombocytopenia (HCC)   Multiple sclerosis (HCC)   Lower extremity edema   Anemia of chronic disease   Paroxysmal atrial fibrillation (HCC)   Essential hypertension Severe tricuspid regurg Mitral regurg HTN Chronic anticoagulation Pleural effusion Hemoptysis Re-expansion edema Acute hypoxic respiratory failure CAD LBBB PPM in situ Sacral wound  BPH DNR status Encounter for palliative care Goals of care   Brief Hospital Course (including significant findings, care, treatment, and services provided and events leading to death)  [name] is a 78 y.o. year old male who was admitted to [facility] 2024/03/11 after presenting to ED with shortness of breath, found to have a R pleural effusion likely in setting of decompensated systolic HF.  He was diuresed and a thoracentesis was ordered -- ultimately completed 6/2 due to patient not being available 6/1. Hours after thoracentesis pt c/o SOB and cough. He received a bronchodilator treatment. He subsequently had a cardiac arrest, with ROSC after about 12 minutes. During intubation he had significant bloody frothy secretions. An IO was placed during the code. He was transferred to the ICU and PCCM was consulted. He developed progressive shock and worsening hypoxemia, requiring escalating vent settings and pressors. An arterial line was placed. He received  TXA neb. Antibiotics were started as well.   On 03-14-2024 he continued to decline, with  rising pressor need and worsening multisystem organ failure. Hemoptysis continued, felt likely related to re-expansion edema sequelae complicated by the patients chronic anticoagulation. Family arrived from out of town and a decision was reached to compassionately transition to comfort care. He was liberated from mechanical ventilation and end of life symptoms were treated. He died peacefully 03-14-2024 at 11:51, with wife and sons at the bedside.     Pertinent Labs and Studies  Significant Diagnostic Studies DG Abd 1 View Result Date: 02/14/2024 CLINICAL DATA:  Orogastric tube placement. EXAM: ABDOMEN - 1 VIEW COMPARISON:  None Available. FINDINGS: Gaseous gastric distension. The side port of the enteric tube projects over the air-filled stomach, the tip is not included in the field of view but in the region of the mid stomach. Airspace opacity in both lung bases was assessed on chest radiograph earlier today. IMPRESSION: 1. Enteric tube tip in the region of the mid stomach. 2. Gaseous gastric distension. Electronically Signed   By: [name] M.D.   On: 02/14/2024 22:10   DG CHEST PORT 1 VIEW Result Date: 02/14/2024 CLINICAL DATA:  Endotracheal tube EXAM: PORTABLE CHEST 1 VIEW COMPARISON:  Chest x-ray 02/14/2024 FINDINGS: New endotracheal tube tip is 5.7 cm above the carina. Sternotomy wires and mediastinal clips are again seen as well as left-sided BC. There is new airspace consolidation throughout the right mid and lower lung. There is stable small pleural effusions. No acute fractures are seen per IMPRESSION: 1. New endotracheal tube tip is 5.7 cm above the carina. 2. New airspace consolidation throughout the right mid and lower lung. Electronically Signed  By: [name] M.D.   On: 02/14/2024 17:55   ECHOCARDIOGRAM COMPLETE Result Date: 02/14/2024    ECHOCARDIOGRAM REPORT   Patient Name:   [name]  Date of Exam: 02/14/2024 Medical Rec #:  [id number]         Height:       68.0 in Accession #:    [id number]        Weight:       160.3 lb Date of Birth:  April 13, 1946         BSA:          1.860 m Patient Age:    77 years          BP:           145/65 mmHg Patient Gender: M                 HR:           80 bpm. Exam Location:  Inpatient Procedure: 2D Echo, Cardiac Doppler and Color Doppler (Both Spectral and Color            Flow Doppler were utilized during procedure). Indications:    Abn ECG  History:        Patient has prior history of Echocardiogram examinations, most                 recent 08/11/2023. CHF, Pacemaker, Arrythmias:Atrial                 Fibrillation; Risk Factors:Hypertension and Dyslipidemia.  Sonographer:    Sulema Endo RDCS, FE, PE Referring Phys: [id number] [facility] IMPRESSIONS  1. Left ventricular ejection fraction, by estimation, is 35%. The left ventricle has moderately decreased function. The left ventricle demonstrates global hypokinesis with septal-lateral dyssynchrony suggestive of LBBB or RV pacing. Left ventricular diastolic parameters are indeterminate.  2. Mildly D-shaped septum suggestive of RV pressure/volume overload. Right ventricular systolic function is mildly reduced. The right ventricular size is moderately enlarged. PA systolic pressure 58 mmHg.  3. Left atrial size was severely dilated.  4. Right atrial size was severely dilated.  5. The mitral valve is abnormal. Moderate mitral valve regurgitation. No evidence of mitral stenosis.  6. Tricuspid valve regurgitation is moderate to severe.  7. The aortic valve is tricuspid. There is mild calcification of the aortic valve. Aortic valve regurgitation is moderate. No aortic stenosis is present.  8. 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 Aortic root at sinuses of valsalva measures 61 mm. This is consistent with prior study. Patient has history of type A dissection repair. FINDINGS  Left Ventricle:  Left ventricular ejection fraction, by estimation, is 35%. The left ventricle has moderately decreased function. The left ventricle demonstrates global hypokinesis. The left ventricular internal cavity size was normal in size. There is no left ventricular hypertrophy. Left ventricular diastolic parameters are indeterminate. Right Ventricle: Mildly D-shaped septum suggestive of RV pressure/volume overload. The right ventricular size is moderately enlarged. No increase in right ventricular wall thickness. Right ventricular systolic function is mildly reduced. There is moderately elevated pulmonary artery systolic pressure. The tricuspid regurgitant velocity is 3.28 m/s, and with an assumed right atrial pressure of 15 mmHg, the estimated right ventricular systolic pressure is 58.0 mmHg. Left Atrium: Left atrial size was severely dilated. Right Atrium: Right atrial size was severely dilated. Pericardium: There is no evidence of pericardial effusion. Mitral Valve: The mitral valve is abnormal. Moderate mitral valve regurgitation. No evidence of  mitral valve stenosis. Tricuspid Valve: The tricuspid valve is normal in structure. Tricuspid valve regurgitation is moderate to severe. Aortic Valve: The aortic valve is tricuspid. There is mild calcification of the aortic valve. Aortic valve regurgitation is moderate. Aortic regurgitation PHT measures 362 msec. No aortic stenosis is present. Pulmonic Valve: The pulmonic valve was normal in structure. Pulmonic valve regurgitation is trivial. Aorta: Aortic dilatation noted. There is severe dilatation of the aortic root, measuring 61 mm. IAS/Shunts: No atrial level shunt detected by color flow Doppler. Additional Comments: A device lead is visualized in the right ventricle.  LEFT VENTRICLE PLAX 2D LVIDd:         5.10 cm   Diastology LVIDs:         4.20 cm   LV e' medial:    12.60 cm/s LV PW:         1.00 cm   LV E/e' medial:  5.6 LV IVS:        1.20 cm   LV e' lateral:   16.00 cm/s  LVOT diam:     2.10 cm   LV E/e' lateral: 4.4 LV SV:         73 LV SV Index:   39 LVOT Area:     3.46 cm  RIGHT VENTRICLE RV Basal diam:  4.80 cm RV Mid diam:    3.40 cm RV S prime:     8.70 cm/s TAPSE (M-mode): 1.1 cm LEFT ATRIUM              Index        RIGHT ATRIUM           Index LA diam:        4.90 cm  2.63 cm/m   RA Area:     38.20 cm LA Vol (A2C):   109.0 ml 58.60 ml/m  RA Volume:   160.00 ml 86.01 ml/m LA Vol (A4C):   117.0 ml 62.90 ml/m LA Biplane Vol: 115.0 ml 61.82 ml/m  AORTIC VALVE LVOT Vmax:   123.00 cm/s LVOT Vmean:  79.500 cm/s LVOT VTI:    0.212 m AI PHT:      362 msec  AORTA Ao Root diam: 5.80 cm Ao Asc diam:  3.00 cm MITRAL VALVE                  TRICUSPID VALVE MV Area (PHT): 4.49 cm       TR Peak grad:   43.0 mmHg MV Decel Time: 169 msec       TR Vmax:        328.00 cm/s MR Peak grad:    113.2 mmHg MR Mean grad:    70.0 mmHg    SHUNTS MR Vmax:         532.00 cm/s  Systemic VTI:  0.21 m MR Vmean:        399.0 cm/s   Systemic Diam: 2.10 cm MR PISA:         1.01 cm MR PISA Eff ROA: 7 mm MR PISA Radius:  0.40 cm MV E velocity: 70.80 cm/s Dalton McleanMD Electronically signed by Archer Bear Signature Date/Time: 02/14/2024/3:05:33 PM    Final    DG Chest 1 View Result Date: 02/14/2024 CLINICAL DATA:  Status post right-sided thoracentesis EXAM: CHEST  1 VIEW COMPARISON:  02/12/2024 FINDINGS: Single lead pacer in the right ventricle. Midline trachea. Mild cardiomegaly. Small right pleural effusion is decreased. Tiny left pleural effusion is unchanged. No pneumothorax. Mild pulmonary venous  congestion. Improved right base aeration with persistent bibasilar airspace disease. Remote right rib fractures. IMPRESSION: No pneumothorax after right-sided thoracentesis. Cardiomegaly with mild pulmonary venous congestion. Improved right base aeration. Persistent bibasilar airspace disease, most likely atelectasis. Electronically Signed   By: Lore Rode M.D.   On: 02/14/2024 12:34   US   THORACENTESIS ASP PLEURAL SPACE W/IMG GUIDE Result Date: 02/14/2024 INDICATION: 78 year old male with right pleural effusion for therapeutic right thoracentesis EXAM: ULTRASOUND GUIDED RIGHT THORACENTESIS MEDICATIONS: 1% lidocaine  10 mL COMPLICATIONS: None immediate. PROCEDURE: An ultrasound guided thoracentesis was thoroughly discussed with the patient and questions answered. The benefits, risks, alternatives and complications were also discussed. The patient understands and wishes to proceed with the procedure. Written consent was obtained. Ultrasound was performed to localize and mark an adequate pocket of fluid in the right chest. The area was then prepped and draped in the normal sterile fashion. 1% Lidocaine  was used for local anesthesia. Under ultrasound guidance a 6 Fr Safe-T-Centesis catheter was introduced. Thoracentesis was performed. The catheter was removed and a dressing applied. FINDINGS: A total of approximately 1.5 L of clear, yellow fluid was removed. IMPRESSION: Successful ultrasound guided right thoracentesis yielding 1.5 L of pleural fluid. Performed By Lorinda Root, PA-C Electronically Signed   By: Creasie Doctor M.D.   On: 02/14/2024 11:18   DG Chest Portable 1 View Result Date: 02/12/2024 CLINICAL DATA:  Productive cough and shortness of breath for 1 week. EXAM: PORTABLE CHEST 1 VIEW COMPARISON:  09/29/2022 FINDINGS: Stable cardiomegaly. Permanent pacemaker remains in place. Previous median sternotomy. New moderate to large right pleural effusion and right basilar atelectasis. Left lung is clear. IMPRESSION: New moderate to large right pleural effusion and right basilar atelectasis. Electronically Signed   By: Marlyce Sine M.D.   On: 02/12/2024 11:59    Microbiology Recent Results (from the past 240 hours)  MRSA Next Gen by PCR, Nasal     Status: None   Collection Time: 03/01/2024  8:28 AM   Specimen: Nasal Mucosa; Nasal Swab  Result Value Ref Range Status   MRSA by PCR Next Gen  NOT DETECTED NOT DETECTED Final    Comment: (NOTE) The GeneXpert MRSA Assay (FDA approved for NASAL specimens only), is one component of a comprehensive MRSA colonization surveillance program. It is not intended to diagnose MRSA infection nor to guide or monitor treatment for MRSA infections. Test performance is not FDA approved in patients less than 58 years old. Performed at [facility], [address] [place], [place] [address]     Lab Basic Metabolic Panel: Recent Labs  Lab 02/12/24 1128 02/12/24 1415 02/13/24 0518 02/14/24 0427 02/14/24 1842 02/14/2024 0614  NA 138  --  138 136 141 140  K 4.2  --  4.3 3.7 4.2 4.3  CL 102  --  105 101 103 101  CO2 26  --  26 31 25  18*  GLUCOSE 104*  --  104* 101* 181* 142*  BUN 20  --  21 25* 38* 43*  CREATININE 1.37*  --  1.28* 1.33* 1.89* 3.02*  CALCIUM  10.2  --  9.6 9.0 9.4 8.9  MG  --  2.1  --  1.8  --   --   PHOS  --  3.2  --  4.0  --   --    Liver Function Tests: Recent Labs  Lab 02/12/24 1128 02/13/24 0518 02/14/24 1842  AST 39 29 121*  ALT 31 26 60*  ALKPHOS 274* 247*  361*  BILITOT 1.2 1.1 1.2  PROT 7.5 6.8 6.0*  ALBUMIN 3.7 3.5 2.7*   No results for input(s): "LIPASE", "AMYLASE" in the last 168 hours. No results for input(s): "AMMONIA" in the last 168 hours. CBC: Recent Labs  Lab 02/12/24 1128 02/13/24 0518 02/14/24 0427 02/14/24 1948 03/12/2024 0559  WBC 4.7 4.5 3.6* 17.6* 28.8*  NEUTROABS  --   --  2.6  --  25.4*  HGB 12.4* 11.8* 10.8* 15.0 16.5  HCT 40.3 39.6 35.3* 49.2 56.0*  MCV 93.1 94.5 93.1 92.7 94.9  PLT 94* 88* 84* 118* 118*   Cardiac Enzymes: No results for input(s): "CKTOTAL", "CKMB", "CKMBINDEX", "TROPONINI" in the last 168 hours. Sepsis Labs: Recent Labs  Lab 02/13/24 0518 02/14/24 0427 02/14/24 1842 02/14/24 1948 03/13/2024 0006 03/13/2024 0559  WBC 4.5 3.6*  --  17.6*  --  28.8*  LATICACIDVEN  --   --  3.7*  --  4.8*  --     Procedures/Operations  6/2 thora 6/2  CPR 6/2 ETT 6/2 IO 6/2 Arterial line    Darin Edinger Daveah Varone 03/09/2024, 2:44 PM

## 2024-03-14 NOTE — IPAL (Signed)
  Interdisciplinary Goals of Care Family Meeting   Date carried out: 03/01/2024  Location of the meeting: Phone conference  Member's involved: Nurse Practitioner and Family Member or next of kin  Durable Power of Attorney or acting medical decision maker: wife and sons     Discussion: We discussed goals of care for [name] .  I spoke w pts son and wife on the phone.  We discussed his ongoing critical decline, current doses of medications and ventilatory support.  We discussed code status. A decision was reached to change code status to DNR  (pre-arrest interventions desired)  Pt other son will be arriving to town sometime today and we will talk about add'l goals of care at that time.   All questions answered   Code status:   Code Status: Do not attempt resuscitation (DNR) PRE-ARREST INTERVENTIONS DESIRED   Disposition: Continue current acute care  Time spent for the meeting: 13 min   Timothy Hence MSN, AGACNP-BC [facility] Pulmonary/Critical Care Medicine 03/03/2024, 8:41 AM

## 2024-03-14 NOTE — Procedures (Signed)
 Extubation Procedure Note  Patient Details:   Name: ALAZAR CHERIAN DOB: 09/22/1945 MRN: [id number]   Airway Documentation:  Airway 7.5 mm (Active)  Secured at (cm) 25 cm 02/17/2024 1059  Measured From Lips 02/18/2024 1059  Secured Location Center 03/13/2024 1059  Secured By [employer] 02/21/2024 0801  Bite Block No 03/10/2024 1059  Tube Holder Repositioned Yes 02/27/2024 0801  Prone position No 02/25/2024 1059  Cuff Pressure (cm H2O) MOV (Manual Technique) 02/26/2024 0801  Site Condition Cool;Dry 02/20/2024 0432   Vent end date: 02/19/2024 Vent end time: 1135   Evaluation  O2 sats: currently acceptable Complications: No apparent complications Patient did tolerate procedure well. Bilateral Breath Sounds: Coarse crackles, Diminished   No  Rosia Cook Nannette 03/03/2024, 11:36 AM  Comfort Care

## 2024-03-14 DEATH — deceased
